# Patient Record
Sex: Male | Born: 1960
Health system: Southern US, Community
[De-identification: ages and names within clinical notes are randomized; demographics above are authoritative.]

## PROBLEM LIST (undated history)

## (undated) DIAGNOSIS — F419 Anxiety disorder, unspecified: Secondary | ICD-10-CM

## (undated) DIAGNOSIS — Z95818 Presence of other cardiac implants and grafts: Secondary | ICD-10-CM

## (undated) DIAGNOSIS — J189 Pneumonia, unspecified organism: Secondary | ICD-10-CM

## (undated) DIAGNOSIS — I219 Acute myocardial infarction, unspecified: Secondary | ICD-10-CM

## (undated) DIAGNOSIS — I5020 Unspecified systolic (congestive) heart failure: Secondary | ICD-10-CM

## (undated) DIAGNOSIS — I739 Peripheral vascular disease, unspecified: Secondary | ICD-10-CM

## (undated) DIAGNOSIS — I509 Heart failure, unspecified: Secondary | ICD-10-CM

## (undated) DIAGNOSIS — I10 Essential (primary) hypertension: Secondary | ICD-10-CM

## (undated) DIAGNOSIS — R7303 Prediabetes: Secondary | ICD-10-CM

## (undated) DIAGNOSIS — E785 Hyperlipidemia, unspecified: Secondary | ICD-10-CM

## (undated) DIAGNOSIS — I2510 Atherosclerotic heart disease of native coronary artery without angina pectoris: Secondary | ICD-10-CM

## (undated) HISTORY — DX: Hyperlipidemia, unspecified: E78.5

## (undated) HISTORY — PX: COLONOSCOPY: SHX174

## (undated) HISTORY — DX: Essential (primary) hypertension: I10

## (undated) HISTORY — PX: CARDIAC CATHETERIZATION: SHX172

## (undated) HISTORY — DX: Atherosclerotic heart disease of native coronary artery without angina pectoris: I25.10

## (undated) HISTORY — DX: Acute myocardial infarction, unspecified: I21.9

---

## 2009-02-19 ENCOUNTER — Emergency Department (HOSPITAL_COMMUNITY): Admission: EM | Admit: 2009-02-19 | Discharge: 2009-02-19 | Payer: Self-pay | Admitting: Emergency Medicine

## 2016-03-16 ENCOUNTER — Ambulatory Visit (INDEPENDENT_AMBULATORY_CARE_PROVIDER_SITE_OTHER): Payer: BLUE CROSS/BLUE SHIELD

## 2016-03-16 ENCOUNTER — Ambulatory Visit (INDEPENDENT_AMBULATORY_CARE_PROVIDER_SITE_OTHER): Payer: BLUE CROSS/BLUE SHIELD | Admitting: Physician Assistant

## 2016-03-16 VITALS — BP 136/78 | HR 95 | Temp 100.7°F | Resp 18 | Ht 71.5 in | Wt 182.0 lb

## 2016-03-16 DIAGNOSIS — R509 Fever, unspecified: Secondary | ICD-10-CM

## 2016-03-16 DIAGNOSIS — R05 Cough: Secondary | ICD-10-CM | POA: Diagnosis not present

## 2016-03-16 DIAGNOSIS — D72828 Other elevated white blood cell count: Secondary | ICD-10-CM

## 2016-03-16 DIAGNOSIS — R059 Cough, unspecified: Secondary | ICD-10-CM

## 2016-03-16 DIAGNOSIS — R5383 Other fatigue: Secondary | ICD-10-CM | POA: Diagnosis not present

## 2016-03-16 LAB — POCT INFLUENZA A/B
Influenza A, POC: NEGATIVE
Influenza B, POC: NEGATIVE

## 2016-03-16 LAB — POCT CBC
Granulocyte percent: 81.7 %G — AB (ref 37–80)
HCT, POC: 44.1 % (ref 43.5–53.7)
Hemoglobin: 15.5 g/dL (ref 14.1–18.1)
Lymph, poc: 1 (ref 0.6–3.4)
MCH, POC: 30 pg (ref 27–31.2)
MCHC: 35.2 g/dL (ref 31.8–35.4)
MCV: 85 fL (ref 80–97)
MID (cbc): 0.4 (ref 0–0.9)
MPV: 7.8 fL (ref 0–99.8)
POC Granulocyte: 6.4 (ref 2–6.9)
POC LYMPH PERCENT: 13.2 %L (ref 10–50)
POC MID %: 5.1 %M (ref 0–12)
Platelet Count, POC: 116 10*3/uL — AB (ref 142–424)
RBC: 5.18 M/uL (ref 4.69–6.13)
RDW, POC: 14.8 %
WBC: 7.8 10*3/uL (ref 4.6–10.2)

## 2016-03-16 MED ORDER — AZITHROMYCIN 250 MG PO TABS: ORAL_TABLET | ORAL | Status: DC

## 2016-03-16 MED ORDER — HYDROCODONE-HOMATROPINE 5-1.5 MG/5ML PO SYRP: 5.0000 mL | ORAL_SOLUTION | Freq: Three times a day (TID) | ORAL | Status: DC | PRN

## 2016-03-16 MED ORDER — NAPROXEN 500 MG PO TABS: 500.0000 mg | ORAL_TABLET | Freq: Two times a day (BID) | ORAL | Status: DC

## 2016-03-16 NOTE — Patient Instructions (Signed)
     IF you received an x-ray today, you will receive an invoice from [facility]. Please contact [facility] at [phone] with questions or concerns regarding your invoice.   IF you received labwork today, you will receive an invoice from [facility]. Please contact [facility] at [phone] with questions or concerns regarding your invoice.   Our billing staff will not be able to assist you with questions regarding bills from these companies.  You will be contacted with the lab results as soon as they are available. The fastest way to get your results is to activate your My Chart account. Instructions are located on the last page of this paperwork. If you have not heard from us regarding the results in 2 weeks, please contact this office.      

## 2016-03-16 NOTE — Progress Notes (Signed)
03/16/2016 5:44 PM   DOB: 01-Jun-1961 / MRN: [id number]  SUBJECTIVE:  Mike [name] is a 55 y.o. male with a 20 pack year history of smoking presenting for cough and fever that started two days ago. Associates rigor near the onset of the illness.  Has been taking OTC medication for cough with some relief.  Feels that he is getting worse.  He has not had the flu shot. He reports a history of walking pneumonia, states these symptoms feel similar to that.    There is no immunization history on file for this patient.  He has No Known Allergies.   He  has no past medical history on file.    He  reports that he has been smoking.  He does not have any smokeless tobacco history on file. He  has no sexual activity history on file. The patient  has no past surgical history on file.  His family history includes Cancer in his brother; Heart disease in his father and mother.  Review of Systems  Constitutional: Positive for fever, chills and malaise/fatigue.  Respiratory: Positive for cough. Negative for sputum production and shortness of breath.   Cardiovascular: Negative for chest pain.  Musculoskeletal: Negative for myalgias.  Skin: Negative for rash.  Neurological: Negative for dizziness and headaches.    Problem list and medications reviewed and updated by myself where necessary, and exist elsewhere in the encounter.   OBJECTIVE:  BP 136/78 mmHg  Pulse 95  Temp(Src) 100.7 F (38.2 C) (Oral)  Resp 18  Ht 5' 11.5" (1.816 m)  Wt 182 lb (82.555 kg)  BMI 25.03 kg/m2  SpO2 93%   SpO2 Readings from Last 3 Encounters:  03/16/16 93%   Physical Exam  Constitutional: He is oriented to person, place, and time. He appears well-developed. He does not appear ill.  Eyes: Conjunctivae and EOM are normal. Pupils are equal, round, and reactive to light.  Cardiovascular: Normal rate, regular rhythm and normal heart sounds.   Pulmonary/Chest: Effort normal and breath sounds normal. No respiratory  distress. He has no wheezes. He has no rales. He exhibits no tenderness.  Abdominal: Soft. He exhibits no distension.  Musculoskeletal: Normal range of motion.  Neurological: He is alert and oriented to person, place, and time. No cranial nerve deficit. Coordination normal.  Skin: Skin is warm and dry. He is not diaphoretic.  Psychiatric: He has a normal mood and affect.  Nursing note and vitals reviewed.   Results for orders placed or performed in visit on 03/16/16 (from the past 72 hour(s))  POCT CBC     Status: Abnormal   Collection Time: 03/16/16  4:42 PM  Result Value Ref Range   WBC 7.8 4.6 - 10.2 K/uL   Lymph, poc 1.0 0.6 - 3.4   POC LYMPH PERCENT 13.2 10 - 50 %L   MID (cbc) 0.4 0 - 0.9   POC MID % 5.1 0 - 12 %M   POC Granulocyte 6.4 2 - 6.9   Granulocyte percent 81.7 (A) 37 - 80 %G   RBC 5.18 4.69 - 6.13 M/uL   Hemoglobin 15.5 14.1 - 18.1 g/dL   HCT, POC 44.1 43.5 - 53.7 %   MCV 85.0 80 - 97 fL   MCH, POC 30.0 27 - 31.2 pg   MCHC 35.2 31.8 - 35.4 g/dL   RDW, POC 14.8 %   Platelet Count, POC 116 (A) 142 - 424 K/uL   MPV 7.8 0 - 99.8 fL  POCT Influenza A/B     Status: Normal   Collection Time: 03/16/16  4:56 PM  Result Value Ref Range   Influenza A, POC Negative Negative   Influenza B, POC Negative Negative   No results found.  ASSESSMENT AND PLAN  Vanny was seen today for fever, chills, cough and anorexia.  Diagnoses and all orders for this visit:  Cough: His HPI is consistent with pneumonia.  He is negative for the flu and his cell counts, though mild, point in the direction of a bacterial etiology. His pulse ox was 93% on arrival, and this increased to 98% upon recheck. His chest appears clear to me on xray.  I will treat him with a macrolide to cover for atypical pneumonia.  Advised that he return in 24-48 hour if he is not improved and he is advised to return sooner if he is worse.  -     Pulse oximetry (single) -     POCT CBC -     DG Chest 2 View; Future -      POCT Influenza A/B -     HYDROcodone-homatropine (HYCODAN) 5-1.5 MG/5ML syrup; Take 5 mLs by mouth every 8 (eight) hours as needed for cough.  Granulocytosis -     azithromycin (ZITHROMAX) 250 MG tablet; Take two daily for three days. -     naproxen (NAPROSYN) 500 MG tablet; Take 1 tablet (500 mg total) by mouth 2 (two) times daily with a meal.     The patient was advised to call or return to clinic if he does not see an improvement in symptoms or to seek the care of the closest emergency department if he worsens with the above plan.   Philis Fendt, MHS, PA-C [facility] 03/16/2016 5:44 PM

## 2018-12-13 ENCOUNTER — Ambulatory Visit (HOSPITAL_COMMUNITY)
Admission: EM | Admit: 2018-12-13 | Discharge: 2018-12-13 | Disposition: A | Discharge status: Home / Self Care | Payer: Self-pay | Attending: Family Medicine | Admitting: Family Medicine

## 2018-12-13 ENCOUNTER — Encounter (HOSPITAL_COMMUNITY): Payer: Self-pay

## 2018-12-13 DIAGNOSIS — M7918 Myalgia, other site: Secondary | ICD-10-CM | POA: Insufficient documentation

## 2018-12-13 DIAGNOSIS — R030 Elevated blood-pressure reading, without diagnosis of hypertension: Secondary | ICD-10-CM | POA: Insufficient documentation

## 2018-12-13 MED ORDER — IBUPROFEN 800 MG PO TABS: 800.0000 mg | ORAL_TABLET | Freq: Three times a day (TID) | ORAL | 0 refills | Status: DC

## 2018-12-13 MED ORDER — TIZANIDINE HCL 4 MG PO TABS: 4.0000 mg | ORAL_TABLET | Freq: Four times a day (QID) | ORAL | 0 refills | Status: DC | PRN

## 2018-12-13 NOTE — ED Notes (Signed)
DR. Meda Coffee aware of last b/p

## 2018-12-13 NOTE — ED Triage Notes (Signed)
Pt present neck and shoulder pain that has been going on over a week.  Pt believes he has a pinch nerve.

## 2018-12-13 NOTE — ED Provider Notes (Signed)
[facility]    CSN: [id number] Arrival date & time: 12/13/18  1512     History   Chief Complaint Chief Complaint  Patient presents with  . Neck Pain  . Shoulder Pain    HPI Mike [name] is a 57 y.o. male.   HPI   Pain in left neck for a week and a half.  No trauma.  No injury.  No overuse.  No vehicle accident or fall.  He states that he has been trying some Tylenol and ibuprofen.  Nothing is helping.  He has some pain that radiates from the left neck down into the left forearm. No weakness.  No numbness of hand.  No known arthritis of neck.    History reviewed. No pertinent past medical history.  There are no active problems to display for this patient.   History reviewed. No pertinent surgical history.     Home Medications    Prior to Admission medications   Medication Sig Start Date End Date Taking? Authorizing Provider  ibuprofen (ADVIL,MOTRIN) 800 MG tablet Take 1 tablet (800 mg total) by mouth 3 (three) times daily. 12/13/18   Raylene Everts, MD  tiZANidine (ZANAFLEX) 4 MG tablet Take 1-2 tablets (4-8 mg total) by mouth every 6 (six) hours as needed for muscle spasms. 12/13/18   Raylene Everts, MD    Family History Family History  Problem Relation Age of Onset  . Heart disease Mother   . Heart disease Father   . Cancer Brother     Social History Social History   Tobacco Use  . Smoking status: Current Every Day Smoker  . Smokeless tobacco: Current User  Substance Use Topics  . Alcohol use: Not on file  . Drug use: Not on file     Allergies   Patient has no known allergies.   Review of Systems Review of Systems  Constitutional: Negative for chills and fever.  HENT: Negative for ear pain and sore throat.   Eyes: Negative for pain and visual disturbance.  Respiratory: Negative for cough and shortness of breath.   Cardiovascular: Negative for chest pain and palpitations.  Gastrointestinal: Negative for abdominal pain  and vomiting.  Genitourinary: Negative for dysuria and hematuria.  Musculoskeletal: Positive for neck pain and neck stiffness. Negative for arthralgias and back pain.  Skin: Negative for color change and rash.  Neurological: Negative for seizures, syncope, weakness and numbness.       Some pain into the forearm  All other systems reviewed and are negative.    Physical Exam Triage Vital Signs ED Triage Vitals  Enc Vitals Group     BP 12/13/18 1610 (!) 205/109     Pulse Rate 12/13/18 1610 90     Resp 12/13/18 1610 18     Temp 12/13/18 1610 98.5 F (36.9 C)     Temp Source 12/13/18 1610 Oral     SpO2 12/13/18 1610 98 %     Weight --      Height --      Head Circumference --      Peak Flow --      Pain Score 12/13/18 1607 9     Pain Loc --      Pain Edu? --      Excl. in [place]? --    No data found.  Updated Vital Signs BP (!) 212/108 (BP Location: Right Arm)   Pulse 79   Temp 98.5 F (36.9 C) (Oral)  Resp 18   SpO2 94%     Physical Exam Constitutional:      General: He is in acute distress.     Appearance: He is well-developed.  HENT:     Head: Normocephalic and atraumatic.     Right Ear: Tympanic membrane, ear canal and external ear normal.     Left Ear: Tympanic membrane, ear canal and external ear normal.     Mouth/Throat:     Mouth: Mucous membranes are moist.     Pharynx: Oropharynx is clear.  Eyes:     Conjunctiva/sclera: Conjunctivae normal.     Pupils: Pupils are equal, round, and reactive to light.  Neck:     Musculoskeletal: Normal range of motion. Muscular tenderness present.   Cardiovascular:     Rate and Rhythm: Normal rate.  Pulmonary:     Effort: Pulmonary effort is normal. No respiratory distress.  Abdominal:     General: There is no distension.     Palpations: Abdomen is soft.  Musculoskeletal: Normal range of motion.  Lymphadenopathy:     Cervical: No cervical adenopathy.  Skin:    General: Skin is warm and dry.  Neurological:      General: No focal deficit present.     Mental Status: He is alert. Mental status is at baseline.     Sensory: No sensory deficit.     Coordination: Coordination normal.     Gait: Gait normal.     Deep Tendon Reflexes: Reflexes normal.  Psychiatric:        Mood and Affect: Mood normal.        Thought Content: Thought content normal.      UC Treatments / Results  Labs (all labs ordered are listed, but only abnormal results are displayed) Labs Reviewed - No data to display  EKG None  Radiology No results found.  Procedures Procedures (including critical care time)  Medications Ordered in UC Medications - No data to display  Initial Impression / Assessment and Plan / UC Course  I have reviewed the triage vital signs and the nursing notes.  Pertinent labs & imaging results that were available during my care of the patient were reviewed by me and considered in my medical decision making (see chart for details).     Discussed hypertension, need for followup Discussed treatment of myofascial pain.  Offered a trigger injection - declines Final Clinical Impressions(s) / UC Diagnoses   Final diagnoses:  Myofascial muscle pain  Elevated blood-pressure reading, without diagnosis of hypertension     Discharge Instructions     Ice or heat to area Gentle massage recommended Take the ibuprofen 3 x a day with food Take the zanaflex as needed muscle relaxer This is useful at night Follow up on your elevated blood pressure when pain is improved    ED Prescriptions    Medication Sig Dispense Auth. Provider   ibuprofen (ADVIL,MOTRIN) 800 MG tablet Take 1 tablet (800 mg total) by mouth 3 (three) times daily. 21 tablet Raylene Everts, MD   tiZANidine (ZANAFLEX) 4 MG tablet Take 1-2 tablets (4-8 mg total) by mouth every 6 (six) hours as needed for muscle spasms. 21 tablet Raylene Everts, MD     Controlled Substance Prescriptions [place] Controlled Substance Registry  consulted? Not Applicable   Raylene Everts, MD 12/13/18 [phone]

## 2018-12-13 NOTE — Discharge Instructions (Signed)
Ice or heat to area Gentle massage recommended Take the ibuprofen 3 x a day with food Take the zanaflex as needed muscle relaxer This is useful at night Follow up on your elevated blood pressure when pain is improved

## 2020-08-01 ENCOUNTER — Inpatient Hospital Stay (HOSPITAL_COMMUNITY)
Admission: EM | Admit: 2020-08-01 | Discharge: 2020-08-04 | DRG: 246 | Disposition: A | Discharge status: Home / Self Care | Payer: BC Managed Care – PPO | Attending: Cardiology | Admitting: Cardiology

## 2020-08-01 ENCOUNTER — Encounter (HOSPITAL_COMMUNITY)
Admission: EM | Disposition: A | Discharge status: Home / Self Care | Payer: Self-pay | Source: Home / Self Care | Attending: Cardiology

## 2020-08-01 ENCOUNTER — Encounter (HOSPITAL_COMMUNITY): Payer: Self-pay | Admitting: Emergency Medicine

## 2020-08-01 ENCOUNTER — Other Ambulatory Visit: Payer: Self-pay

## 2020-08-01 DIAGNOSIS — G4733 Obstructive sleep apnea (adult) (pediatric): Secondary | ICD-10-CM | POA: Diagnosis not present

## 2020-08-01 DIAGNOSIS — E876 Hypokalemia: Secondary | ICD-10-CM | POA: Diagnosis not present

## 2020-08-01 DIAGNOSIS — I2510 Atherosclerotic heart disease of native coronary artery without angina pectoris: Secondary | ICD-10-CM | POA: Diagnosis present

## 2020-08-01 DIAGNOSIS — E785 Hyperlipidemia, unspecified: Secondary | ICD-10-CM | POA: Diagnosis present

## 2020-08-01 DIAGNOSIS — I2119 ST elevation (STEMI) myocardial infarction involving other coronary artery of inferior wall: Secondary | ICD-10-CM | POA: Diagnosis not present

## 2020-08-01 DIAGNOSIS — I10 Essential (primary) hypertension: Secondary | ICD-10-CM

## 2020-08-01 DIAGNOSIS — S60211A Contusion of right wrist, initial encounter: Secondary | ICD-10-CM | POA: Diagnosis not present

## 2020-08-01 DIAGNOSIS — I083 Combined rheumatic disorders of mitral, aortic and tricuspid valves: Secondary | ICD-10-CM | POA: Diagnosis not present

## 2020-08-01 DIAGNOSIS — Z8249 Family history of ischemic heart disease and other diseases of the circulatory system: Secondary | ICD-10-CM | POA: Diagnosis not present

## 2020-08-01 DIAGNOSIS — I110 Hypertensive heart disease with heart failure: Secondary | ICD-10-CM | POA: Diagnosis not present

## 2020-08-01 DIAGNOSIS — Z20822 Contact with and (suspected) exposure to covid-19: Secondary | ICD-10-CM | POA: Diagnosis present

## 2020-08-01 DIAGNOSIS — F17210 Nicotine dependence, cigarettes, uncomplicated: Secondary | ICD-10-CM | POA: Diagnosis present

## 2020-08-01 DIAGNOSIS — Z955 Presence of coronary angioplasty implant and graft: Secondary | ICD-10-CM

## 2020-08-01 DIAGNOSIS — X58XXXA Exposure to other specified factors, initial encounter: Secondary | ICD-10-CM | POA: Diagnosis not present

## 2020-08-01 DIAGNOSIS — I4901 Ventricular fibrillation: Secondary | ICD-10-CM | POA: Diagnosis not present

## 2020-08-01 DIAGNOSIS — R0602 Shortness of breath: Secondary | ICD-10-CM

## 2020-08-01 DIAGNOSIS — I340 Nonrheumatic mitral (valve) insufficiency: Secondary | ICD-10-CM | POA: Diagnosis not present

## 2020-08-01 DIAGNOSIS — I213 ST elevation (STEMI) myocardial infarction of unspecified site: Secondary | ICD-10-CM | POA: Diagnosis present

## 2020-08-01 DIAGNOSIS — I200 Unstable angina: Secondary | ICD-10-CM | POA: Diagnosis not present

## 2020-08-01 DIAGNOSIS — I517 Cardiomegaly: Secondary | ICD-10-CM | POA: Diagnosis not present

## 2020-08-01 DIAGNOSIS — R52 Pain, unspecified: Secondary | ICD-10-CM | POA: Diagnosis not present

## 2020-08-01 DIAGNOSIS — I219 Acute myocardial infarction, unspecified: Secondary | ICD-10-CM

## 2020-08-01 DIAGNOSIS — I2111 ST elevation (STEMI) myocardial infarction involving right coronary artery: Secondary | ICD-10-CM | POA: Diagnosis not present

## 2020-08-01 DIAGNOSIS — I5021 Acute systolic (congestive) heart failure: Secondary | ICD-10-CM | POA: Diagnosis present

## 2020-08-01 DIAGNOSIS — I469 Cardiac arrest, cause unspecified: Secondary | ICD-10-CM | POA: Diagnosis not present

## 2020-08-01 DIAGNOSIS — R0789 Other chest pain: Secondary | ICD-10-CM | POA: Diagnosis not present

## 2020-08-01 DIAGNOSIS — R079 Chest pain, unspecified: Secondary | ICD-10-CM | POA: Diagnosis not present

## 2020-08-01 HISTORY — PX: LEFT HEART CATH AND CORONARY ANGIOGRAPHY: CATH118249

## 2020-08-01 HISTORY — PX: CORONARY/GRAFT ACUTE MI REVASCULARIZATION: CATH118305

## 2020-08-01 HISTORY — DX: Acute myocardial infarction, unspecified: I21.9

## 2020-08-01 SURGERY — CORONARY/GRAFT ACUTE MI REVASCULARIZATION
Anesthesia: LOCAL

## 2020-08-01 MED ORDER — LIDOCAINE HCL (PF) 1 % IJ SOLN
INTRAMUSCULAR | Status: DC | PRN
  Administered 2020-08-01: 5 mL via SUBCUTANEOUS

## 2020-08-01 MED ORDER — HEPARIN (PORCINE) IN NACL 1000-0.9 UT/500ML-% IV SOLN
INTRAVENOUS | Status: AC
  Filled 2020-08-01: qty 500

## 2020-08-01 MED ORDER — HEPARIN SODIUM (PORCINE) 1000 UNIT/ML IJ SOLN
INTRAMUSCULAR | Status: DC | PRN
  Administered 2020-08-01: 6000 [IU] via INTRAVENOUS

## 2020-08-01 MED ORDER — VERAPAMIL HCL 2.5 MG/ML IV SOLN
INTRAVENOUS | Status: AC
  Filled 2020-08-01: qty 2

## 2020-08-01 MED ORDER — TICAGRELOR 90 MG PO TABS
ORAL_TABLET | ORAL | Status: AC
  Filled 2020-08-01: qty 1

## 2020-08-01 MED ORDER — LABETALOL HCL 5 MG/ML IV SOLN
10.0000 mg | Freq: Once | INTRAVENOUS | Status: AC
  Administered 2020-08-01: 10 mg via INTRAVENOUS

## 2020-08-01 MED ORDER — SODIUM CHLORIDE 0.9 % IV SOLN
INTRAVENOUS | Status: AC | PRN
  Administered 2020-08-01: 91 mL/h via INTRAVENOUS

## 2020-08-01 MED ORDER — MORPHINE SULFATE (PF) 4 MG/ML IV SOLN
INTRAVENOUS | Status: AC
  Filled 2020-08-01: qty 1

## 2020-08-01 MED ORDER — LIDOCAINE HCL (PF) 1 % IJ SOLN
INTRAMUSCULAR | Status: AC
  Filled 2020-08-01: qty 30

## 2020-08-01 MED ORDER — IOHEXOL 350 MG/ML SOLN
INTRAVENOUS | Status: AC
  Filled 2020-08-01: qty 1

## 2020-08-01 MED ORDER — TICAGRELOR 90 MG PO TABS
ORAL_TABLET | ORAL | Status: DC | PRN
  Administered 2020-08-01: 180 mg via ORAL

## 2020-08-01 MED ORDER — HEPARIN SODIUM (PORCINE) 1000 UNIT/ML IJ SOLN
INTRAMUSCULAR | Status: AC
  Filled 2020-08-01: qty 1

## 2020-08-01 MED ORDER — HEPARIN SODIUM (PORCINE) 5000 UNIT/ML IJ SOLN
4000.0000 [IU] | Freq: Once | INTRAMUSCULAR | Status: AC
  Administered 2020-08-01: 4000 [IU] via INTRAVENOUS

## 2020-08-01 MED ORDER — VERAPAMIL HCL 2.5 MG/ML IV SOLN
INTRAVENOUS | Status: DC | PRN
  Administered 2020-08-01: 10 mL via INTRA_ARTERIAL

## 2020-08-01 MED ORDER — HEPARIN (PORCINE) IN NACL 1000-0.9 UT/500ML-% IV SOLN
INTRAVENOUS | Status: DC | PRN
  Administered 2020-08-01 (×2): 500 mL

## 2020-08-01 SURGICAL SUPPLY — 19 items
BALLN SAPPHIRE 2.5X15 (BALLOONS) ×2
BALLN SAPPHIRE ~~LOC~~ 2.75X12 (BALLOONS) ×2 IMPLANT
BALLOON SAPPHIRE 2.5X15 (BALLOONS) ×1 IMPLANT
CATH INFINITI 5 FR JL3.5 (CATHETERS) ×2 IMPLANT
CATH LAUNCHER 6FR JR4 (CATHETERS) ×2 IMPLANT
CATH OPTITORQUE TIG 4.0 5F (CATHETERS) ×2 IMPLANT
DEVICE RAD COMP TR BAND LRG (VASCULAR PRODUCTS) ×2 IMPLANT
GLIDESHEATH SLEND A-KIT 6F 22G (SHEATH) ×2 IMPLANT
GUIDEWIRE INQWIRE 1.5J.035X260 (WIRE) ×1 IMPLANT
INQWIRE 1.5J .035X260CM (WIRE) ×2
KIT ENCORE 26 ADVANTAGE (KITS) ×2 IMPLANT
KIT HEART LEFT (KITS) ×2 IMPLANT
KIT HEMO VALVE WATCHDOG (MISCELLANEOUS) ×2 IMPLANT
PACK CARDIAC CATHETERIZATION (CUSTOM PROCEDURE TRAY) ×2 IMPLANT
STENT RESOLUTE ONYX 2.5X26 (Permanent Stent) ×2 IMPLANT
SYR MEDRAD MARK 7 150ML (SYRINGE) ×2 IMPLANT
TRANSDUCER W/STOPCOCK (MISCELLANEOUS) ×2 IMPLANT
TUBING CIL FLEX 10 FLL-RA (TUBING) ×2 IMPLANT
WIRE ASAHI PROWATER 180CM (WIRE) ×2 IMPLANT

## 2020-08-01 NOTE — ED Triage Notes (Signed)
Pt brought to ED by [facility] from home as a code STEMI, cp with sob while watching tv at home. BP  220/133, HR 114, R 24, SPO2 94% RA. 324 mg ASA, 4 mg MS, 4 mg Zofran given by EMS pta to ED.

## 2020-08-01 NOTE — Interval H&P Note (Signed)
History and Physical Interval Note:  08/01/2020 11:21 PM  Mike [name]  has presented today for surgery, with the diagnosis of STEMI.  The various methods of treatment have been discussed with the patient and family. After consideration of risks, benefits and other options for treatment, the patient has consented to  Procedure(s): Coronary/Graft Acute MI Revascularization (N/A) LEFT HEART CATH AND CORONARY ANGIOGRAPHY (N/A) as a surgical intervention.  The patient's history has been reviewed, patient examined, no change in status, stable for surgery.  I have reviewed the patient's chart and labs.  Questions were answered to the patient's satisfaction.    2016 Appropriate Use Criteria for Coronary Revascularization in Patients With Acute Coronary Syndrome STEMI Revascularization Strategy Time since STEMI symptom onset Link Here: [email or web address] Patient Information: Revascularization Strategy Immediate Revascularization by PCI  Time since STEMI symptom onset <=12 hours  Indication:  Revascularization of the Presumed STEMI Culprit Artery by Primary PCI <=12 hours after symptom onset A (9) Indication: 1; Score 9 10319 Indication:  Immediate Revascularization of 1 or more Non-culprit Arteries during the Same Procedure Following Successful Treatment of the STEMI Culprit Artery by Primary PCI.  PCI or CABG of 1 or more additional vessels for Cardiogenic Shock Persisting after PCI of the presumed culprit artery A (8) Indication: 4; Score 8 10320 Indication:  Immediate Revascularization of 1 or more Non-culprit Arteries during the Same Procedure Following Successful Treatment of the STEMI Culprit Artery by Primary PCI.  Revascularization of 1 or more additional severe (>=70% DS) stenoses in a stable patient immediately following PCI of the presumed culprit artery M (6) Indication: 5; Score 6 10321 Indication:  Immediate  Revascularization of 1 or more Non-culprit Arteries during the Same Procedure Following Successful Treatment of the STEMI Culprit Artery by Primary PCI.  Revascularization of 1 or more additional intermediate (50-69% DS) stenoses in a stable patient immediately following PCI of the presumed culprit artery M (4) Indication: 6; Score 4   [name] [name]

## 2020-08-01 NOTE — ED Notes (Signed)
Cardiologist at the bedside

## 2020-08-01 NOTE — ED Provider Notes (Addendum)
[place] EMERGENCY DEPARTMENT Provider Note   CSN: [id number] Arrival date & time: 08/01/20  2253     History Chief Complaint  Patient presents with  . Chest Pain  . Code STEMI    Mike [name] is a 59 y.o. male.  59 yo M with a cc of chest pain and diaphoresis.  Started about 30 min to an hour ago.  SOB.  Feels like a pressure to his chest.  No radiation.  EMS on scene with STEMI.  Given 324 aspirin and 6mg  of morphine.  Smoker, HTN.  Denies radiation to the back.  Denies abdominal pain.  Mild cough today.  Denies fever.  Denies chest pain for the past couple days.  Started while watching TV.   The history is provided by the patient.  Chest Pain Pain location:  Substernal area Pain quality: crushing   Pain radiates to:  Does not radiate Pain severity:  Severe Onset quality:  Sudden Duration:  1 hour Timing:  Constant Progression:  Partially resolved Chronicity:  New Relieved by:  Nothing Worsened by:  Nothing Ineffective treatments:  None tried Associated symptoms: diaphoresis and shortness of breath   Associated symptoms: no abdominal pain, no fever, no headache, no palpitations and no vomiting   Risk factors: hypertension and smoking        History reviewed. No pertinent past medical history.  Patient Active Problem List   Diagnosis Date Noted  . Accelerated hypertension 08/01/2020    History reviewed. No pertinent surgical history.     Family History  Problem Relation Age of Onset  . Heart disease Mother   . Heart disease Father   . Cancer Brother     Social History   Tobacco Use  . Smoking status: Current Every Day Smoker    Packs/day: 0.50    Years: 40.00    Pack years: 20.00    Types: Cigarettes  . Smokeless tobacco: Current User  Substance Use Topics  . Alcohol use: Not on file  . Drug use: Not on file    Home Medications Prior to Admission medications   Medication Sig Start Date End Date Taking? Authorizing  Provider  ibuprofen (ADVIL,MOTRIN) 800 MG tablet Take 1 tablet (800 mg total) by mouth 3 (three) times daily. 12/13/18   Raylene Everts, MD  tiZANidine (ZANAFLEX) 4 MG tablet Take 1-2 tablets (4-8 mg total) by mouth every 6 (six) hours as needed for muscle spasms. 12/13/18   Raylene Everts, MD    Allergies    Patient has no known allergies.  Review of Systems   Review of Systems  Constitutional: Positive for diaphoresis. Negative for chills and fever.  HENT: Negative for congestion and facial swelling.   Eyes: Negative for discharge and visual disturbance.  Respiratory: Positive for shortness of breath.   Cardiovascular: Positive for chest pain. Negative for palpitations.  Gastrointestinal: Negative for abdominal pain, diarrhea and vomiting.  Musculoskeletal: Negative for arthralgias and myalgias.  Skin: Negative for color change and rash.  Neurological: Negative for tremors, syncope and headaches.  Psychiatric/Behavioral: Negative for confusion and dysphoric mood.    Physical Exam Updated Vital Signs BP (!) 203/93   Pulse (!) 112   Temp 98.3 F (36.8 C) (Oral)   Resp (!) 21   Ht 6' (1.829 m)   Wt 90.7 kg   SpO2 95%   BMI 27.12 kg/m   Physical Exam Vitals and nursing note reviewed.  Constitutional:  Appearance: He is well-developed. He is diaphoretic.  HENT:     Head: Normocephalic and atraumatic.  Eyes:     Pupils: Pupils are equal, round, and reactive to light.  Neck:     Vascular: No JVD.  Cardiovascular:     Rate and Rhythm: Regular rhythm. Tachycardia present.     Heart sounds: No murmur heard.  No friction rub. No gallop.   Pulmonary:     Effort: No respiratory distress.     Breath sounds: No decreased breath sounds, wheezing or rhonchi.  Abdominal:     General: There is no distension.     Tenderness: There is no guarding or rebound.  Musculoskeletal:        General: Normal range of motion.     Cervical back: Normal range of motion and neck  supple.  Skin:    Coloration: Skin is not pale.     Findings: No rash.  Neurological:     Mental Status: He is alert and oriented to person, place, and time.  Psychiatric:        Behavior: Behavior normal.     ED Results / Procedures / Treatments   Labs (all labs ordered are listed, but only abnormal results are displayed) Labs Reviewed - No data to display  EKG EKG Interpretation  Date/Time:  Wednesday August 01 2020 22:55:47 EDT Ventricular Rate:  116 PR Interval:    QRS Duration: 122 QT Interval:  305 QTC Calculation: 424 R Axis:   91 Text Interpretation: Sinus tachycardia Nonspecific intraventricular conduction delay Inferior infarct, acute (RCA) Anteroseptal infarct, age indeterminate Probable RV involvement, suggest recording right precordial leads >>> Acute MI <<< No old tracing to compare STEMI called Confirmed by Deno Etienne [phone]) on 08/01/2020 11:18:52 PM   Radiology No results found.  Procedures Procedures (including critical care time) Discussed smoking cessation with patient and was they were offerred resources to help stop.  Total time was 5 min CPT code 99406.   Medications Ordered in ED Medications  morphine 4 MG/ML injection (has no administration in time range)  labetalol (NORMODYNE) injection 10 mg (10 mg Intravenous Given 08/01/20 2303)  heparin injection 4,000 Units (4,000 Units Intravenous Given 08/01/20 2302)    ED Course  I have reviewed the triage vital signs and the nursing notes.  Pertinent labs & imaging results that were available during my care of the patient were reviewed by me and considered in my medical decision making (see chart for details).    MDM Rules/Calculators/A&P                          60 yo M with a cc of chest pain.  Found to have an inferior STEMI on arrival with EMS.  Given heparin here, started on O2 as dipping into mid 80's.    Cards at bedside, given labetalol, morphine.  Will take emergently to the cath lab.    CRITICAL CARE Performed by: Cecilio Asper   Total critical care time: 35 minutes  Critical care time was exclusive of separately billable procedures and treating other patients.  Critical care was necessary to treat or prevent imminent or life-threatening deterioration.  Critical care was time spent personally by me on the following activities: development of treatment plan with patient and/or surrogate as well as nursing, discussions with consultants, evaluation of patient's response to treatment, examination of patient, obtaining history from patient or surrogate, ordering and performing treatments and interventions, ordering  and review of laboratory studies, ordering and review of radiographic studies, pulse oximetry and re-evaluation of patient's condition.  The patients results and plan were reviewed and discussed.   Any x-rays performed were independently reviewed by myself.   Differential diagnosis were considered with the presenting HPI.  Medications  morphine 4 MG/ML injection (has no administration in time range)  labetalol (NORMODYNE) injection 10 mg (10 mg Intravenous Given 08/01/20 2303)  heparin injection 4,000 Units (4,000 Units Intravenous Given 08/01/20 2302)    Vitals:   08/01/20 2257 08/01/20 2259 08/01/20 2303 08/01/20 2304  BP:  (!) 213/114 (!) 203/93   Pulse:  (!) 112 (!) 113 (!) 112  Resp:  18 (!) 26 (!) 21  Temp:  98.3 F (36.8 C)    TempSrc:  Oral    SpO2:  94% 94% 95%  Weight: 90.7 kg     Height: 6' (1.829 m)       Final diagnoses:  Acute ST elevation myocardial infarction (STEMI) of inferior wall (HCC)    Admission/ observation were discussed with the admitting physician, patient and/or family and they are comfortable with the plan.    Final Clinical Impression(s) / ED Diagnoses Final diagnoses:  Acute ST elevation myocardial infarction (STEMI) of inferior wall [facility])    Rx / DC Orders ED Discharge Orders    None            Deno Etienne, DO 08/01/20 2319

## 2020-08-01 NOTE — H&P (Signed)
Mike [name] is an 59 y.o. male.   Chief Complaint: Chest pain HPI:   59 y.o. Caucasian male  with no significant medical history with chest pain.  Pain started around 9 PM. No other associated symptoms. BP 213/100 mmHg, HR 110 bpm on presentation. EKG showed inferior ST elevation and anterolateral ST depression.   He received 325 mg ASA, 4000 U heparin, 6 mg morphine, 4 mg zofran by EMS. Received 10 IV labetalol and 4 IV morphine in the ED so far.   He is fully vaccinated against COVID. No recent COVID contacts or symptoms.   History reviewed. No pertinent past medical history.  History reviewed. No pertinent surgical history.  Family History  Problem Relation Age of Onset  . Heart disease Mother   . Heart disease Father   . Cancer Brother    Social History:  reports that he has been smoking cigarettes. He has a 20.00 pack-year smoking history. He uses smokeless tobacco. No history on file for alcohol use and drug use.  Allergies: No Known Allergies  Review of Systems  Constitutional: Negative for decreased appetite, malaise/fatigue, weight gain and weight loss.  HENT: Negative for congestion.   Eyes: Negative for visual disturbance.  Cardiovascular: Positive for chest pain. Negative for dyspnea on exertion, leg swelling, palpitations and syncope.  Respiratory: Negative for cough.   Endocrine: Negative for cold intolerance.  Hematologic/Lymphatic: Does not bruise/bleed easily.  Skin: Negative for itching and rash.  Musculoskeletal: Negative for myalgias.  Gastrointestinal: Negative for abdominal pain, nausea and vomiting.  Genitourinary: Negative for dysuria.  Neurological: Negative for dizziness and weakness.  Psychiatric/Behavioral: The patient is not nervous/anxious.   All other systems reviewed and are negative.    Blood pressure (!) 213/114, pulse (!) 112, temperature 98.3 F (36.8 C), temperature source Oral, resp. rate (!) 21, height 6' (1.829 m), weight 90.7  kg, SpO2 95 %. Body mass index is 27.12 kg/m.  Physical Exam Vitals and nursing note reviewed.  Constitutional:      General: He is not in acute distress.    Appearance: He is well-developed.  HENT:     Head: Normocephalic and atraumatic.  Eyes:     Conjunctiva/sclera: Conjunctivae normal.     Pupils: Pupils are equal, round, and reactive to light.  Neck:     Vascular: No JVD.  Cardiovascular:     Rate and Rhythm: Normal rate and regular rhythm.     Pulses: Intact distal pulses.          Dorsalis pedis pulses are 1+ on the right side and 1+ on the left side.       Posterior tibial pulses are 0 on the right side and 0 on the left side.     Heart sounds: No murmur heard.   Pulmonary:     Effort: Pulmonary effort is normal.     Breath sounds: Normal breath sounds. No wheezing or rales.  Abdominal:     General: Bowel sounds are normal.     Palpations: Abdomen is soft.     Tenderness: There is no rebound.  Musculoskeletal:        General: No tenderness. Normal range of motion.     Left lower leg: No edema.  Lymphadenopathy:     Cervical: No cervical adenopathy.  Skin:    General: Skin is warm and dry.  Neurological:     Mental Status: He is alert and oriented to person, place, and time.     Cranial  Nerves: No cranial nerve deficit.     No results found for this or any previous visit (from the past 48 hour(s)).  Labs:   Lab Results  Component Value Date   WBC 7.8 03/16/2016   HGB 15.5 03/16/2016   HCT 44.1 03/16/2016   MCV 85.0 03/16/2016      Current Facility-Administered Medications:  .  morphine 4 MG/ML injection, , , ,   Current Outpatient Medications:  .  ibuprofen (ADVIL,MOTRIN) 800 MG tablet, Take 1 tablet (800 mg total) by mouth 3 (three) times daily., Disp: 21 tablet, Rfl: 0 .  tiZANidine (ZANAFLEX) 4 MG tablet, Take 1-2 tablets (4-8 mg total) by mouth every 6 (six) hours as needed for muscle spasms., Disp: 21 tablet, Rfl: 0   Today's Vitals    08/01/20 2256 08/01/20 2257 08/01/20 2259 08/01/20 2304  BP:   (!) 213/114   Pulse:   (!) 112 (!) 112  Resp:   18 (!) 21  Temp:   98.3 F (36.8 C)   TempSrc:   Oral   SpO2:   94% 95%  Weight:  90.7 kg    Height:  6' (1.829 m)    PainSc: 6       Body mass index is 27.12 kg/m.   CARDIAC STUDIES:  EKG 08/01/2020: Sinus tachycardia. Acute inferior infarct  Echocardiogram pending:   Assessment/Plan  59 y.o. Caucasian male, smoker with inferior STEMI. Accelerated hypertension.  STEMI: Acute inferior STEMI. Awaiting cath lab transport.  Accelerated hypertension: Could be driven by pain. PRN meds for now. 10 iv labetalol + 10 iv hydralazine in the ED.  Could need cleviprex drip if BP not controlled.     Nigel Mormon, MD 08/01/2020, 11:08 PM [facility]. [place] Pager: [phone] Office: [phone] If no answer: [phone]

## 2020-08-02 ENCOUNTER — Encounter (HOSPITAL_COMMUNITY): Payer: Self-pay | Admitting: Cardiology

## 2020-08-02 ENCOUNTER — Inpatient Hospital Stay (HOSPITAL_COMMUNITY): Payer: BC Managed Care – PPO

## 2020-08-02 DIAGNOSIS — I2510 Atherosclerotic heart disease of native coronary artery without angina pectoris: Secondary | ICD-10-CM | POA: Diagnosis present

## 2020-08-02 DIAGNOSIS — X58XXXA Exposure to other specified factors, initial encounter: Secondary | ICD-10-CM | POA: Diagnosis not present

## 2020-08-02 DIAGNOSIS — I4901 Ventricular fibrillation: Secondary | ICD-10-CM | POA: Diagnosis not present

## 2020-08-02 DIAGNOSIS — I083 Combined rheumatic disorders of mitral, aortic and tricuspid valves: Secondary | ICD-10-CM | POA: Diagnosis present

## 2020-08-02 DIAGNOSIS — R0789 Other chest pain: Secondary | ICD-10-CM | POA: Diagnosis present

## 2020-08-02 DIAGNOSIS — E785 Hyperlipidemia, unspecified: Secondary | ICD-10-CM | POA: Diagnosis present

## 2020-08-02 DIAGNOSIS — Z8249 Family history of ischemic heart disease and other diseases of the circulatory system: Secondary | ICD-10-CM | POA: Diagnosis not present

## 2020-08-02 DIAGNOSIS — G4733 Obstructive sleep apnea (adult) (pediatric): Secondary | ICD-10-CM | POA: Diagnosis present

## 2020-08-02 DIAGNOSIS — I213 ST elevation (STEMI) myocardial infarction of unspecified site: Secondary | ICD-10-CM | POA: Diagnosis present

## 2020-08-02 DIAGNOSIS — F17210 Nicotine dependence, cigarettes, uncomplicated: Secondary | ICD-10-CM | POA: Diagnosis present

## 2020-08-02 DIAGNOSIS — E876 Hypokalemia: Secondary | ICD-10-CM | POA: Diagnosis present

## 2020-08-02 DIAGNOSIS — Z20822 Contact with and (suspected) exposure to covid-19: Secondary | ICD-10-CM | POA: Diagnosis present

## 2020-08-02 DIAGNOSIS — I110 Hypertensive heart disease with heart failure: Secondary | ICD-10-CM | POA: Diagnosis present

## 2020-08-02 DIAGNOSIS — I2119 ST elevation (STEMI) myocardial infarction involving other coronary artery of inferior wall: Secondary | ICD-10-CM | POA: Diagnosis present

## 2020-08-02 DIAGNOSIS — I5021 Acute systolic (congestive) heart failure: Secondary | ICD-10-CM | POA: Diagnosis present

## 2020-08-02 DIAGNOSIS — S60211A Contusion of right wrist, initial encounter: Secondary | ICD-10-CM | POA: Diagnosis not present

## 2020-08-02 DIAGNOSIS — I469 Cardiac arrest, cause unspecified: Secondary | ICD-10-CM | POA: Diagnosis not present

## 2020-08-02 LAB — POCT I-STAT, CHEM 8
BUN: 20 mg/dL (ref 6–20)
BUN: 20 mg/dL (ref 6–20)
Calcium, Ion: 1.05 mmol/L — ABNORMAL LOW (ref 1.15–1.40)
Calcium, Ion: 1.07 mmol/L — ABNORMAL LOW (ref 1.15–1.40)
Chloride: 78 mmol/L — ABNORMAL LOW (ref 98–111)
Chloride: 84 mmol/L — ABNORMAL LOW (ref 98–111)
Creatinine, Ser: 0.4 mg/dL — ABNORMAL LOW (ref 0.61–1.24)
Creatinine, Ser: 0.5 mg/dL — ABNORMAL LOW (ref 0.61–1.24)
Glucose, Bld: 157 mg/dL — ABNORMAL HIGH (ref 70–99)
Glucose, Bld: 168 mg/dL — ABNORMAL HIGH (ref 70–99)
HCT: 42 % (ref 39.0–52.0)
HCT: 44 % (ref 39.0–52.0)
Hemoglobin: 14.3 g/dL (ref 13.0–17.0)
Hemoglobin: 15 g/dL (ref 13.0–17.0)
Potassium: 2.8 mmol/L — ABNORMAL LOW (ref 3.5–5.1)
Potassium: 3 mmol/L — ABNORMAL LOW (ref 3.5–5.1)
Sodium: 113 mmol/L — CL (ref 135–145)
Sodium: 119 mmol/L — CL (ref 135–145)
TCO2: 15 mmol/L — ABNORMAL LOW (ref 22–32)
TCO2: 17 mmol/L — ABNORMAL LOW (ref 22–32)

## 2020-08-02 LAB — BASIC METABOLIC PANEL
Anion gap: 12 (ref 5–15)
BUN: 16 mg/dL (ref 6–20)
CO2: 17 mmol/L — ABNORMAL LOW (ref 22–32)
Calcium: 8.6 mg/dL — ABNORMAL LOW (ref 8.9–10.3)
Chloride: 108 mmol/L (ref 98–111)
Creatinine, Ser: 1 mg/dL (ref 0.61–1.24)
GFR calc Af Amer: >60 mL/min (ref >60)
GFR calc non Af Amer: >60 mL/min (ref >60)
Glucose, Bld: 144 mg/dL — ABNORMAL HIGH (ref 70–99)
Potassium: 4.1 mmol/L (ref 3.5–5.1)
Sodium: 137 mmol/L (ref 135–145)

## 2020-08-02 LAB — COMPREHENSIVE METABOLIC PANEL
ALT: 22 U/L (ref 0–44)
AST: 29 U/L (ref 15–41)
Albumin: 4.2 g/dL (ref 3.5–5.0)
Alkaline Phosphatase: 85 U/L (ref 38–126)
Anion gap: 14 (ref 5–15)
BUN: 16 mg/dL (ref 6–20)
CO2: 19 mmol/L — ABNORMAL LOW (ref 22–32)
Calcium: 8.8 mg/dL — ABNORMAL LOW (ref 8.9–10.3)
Chloride: 105 mmol/L (ref 98–111)
Creatinine, Ser: 1.14 mg/dL (ref 0.61–1.24)
GFR calc Af Amer: >60 mL/min (ref >60)
GFR calc non Af Amer: >60 mL/min (ref >60)
Glucose, Bld: 189 mg/dL — ABNORMAL HIGH (ref 70–99)
Potassium: 3.4 mmol/L — ABNORMAL LOW (ref 3.5–5.1)
Sodium: 138 mmol/L (ref 135–145)
Total Bilirubin: 1 mg/dL (ref 0.3–1.2)
Total Protein: 7.5 g/dL (ref 6.5–8.1)

## 2020-08-02 LAB — CBC WITH DIFFERENTIAL/PLATELET
Abs Immature Granulocytes: 0.08 10*3/uL — ABNORMAL HIGH (ref 0.00–0.07)
Basophils Absolute: 0 10*3/uL (ref 0.0–0.1)
Basophils Relative: 0 %
Eosinophils Absolute: 0 10*3/uL (ref 0.0–0.5)
Eosinophils Relative: 0 %
HCT: 45.3 % (ref 39.0–52.0)
Hemoglobin: 15 g/dL (ref 13.0–17.0)
Immature Granulocytes: 1 %
Lymphocytes Relative: 9 %
Lymphs Abs: 1.5 10*3/uL (ref 0.7–4.0)
MCH: 29.4 pg (ref 26.0–34.0)
MCHC: 33.1 g/dL (ref 30.0–36.0)
MCV: 88.6 fL (ref 80.0–100.0)
Monocytes Absolute: 1.2 10*3/uL — ABNORMAL HIGH (ref 0.1–1.0)
Monocytes Relative: 7 %
Neutro Abs: 13.4 10*3/uL — ABNORMAL HIGH (ref 1.7–7.7)
Neutrophils Relative %: 83 %
Platelets: 197 10*3/uL (ref 150–400)
RBC: 5.11 MIL/uL (ref 4.22–5.81)
RDW: 14.8 % (ref 11.5–15.5)
WBC: 16.3 10*3/uL — ABNORMAL HIGH (ref 4.0–10.5)
nRBC: 0 % (ref 0.0–0.2)

## 2020-08-02 LAB — MAGNESIUM: Magnesium: 2.2 mg/dL (ref 1.7–2.4)

## 2020-08-02 LAB — ECHOCARDIOGRAM COMPLETE
Area-P 1/2: 5.13 cm2
Height: 72 in
MV M vel: 4.27 m/s
MV Peak grad: 72.8 mmHg
S' Lateral: 5 cm
Weight: 2980.62 oz

## 2020-08-02 LAB — CBC
HCT: 51.3 % (ref 39.0–52.0)
Hemoglobin: 16.6 g/dL (ref 13.0–17.0)
MCH: 29.6 pg (ref 26.0–34.0)
MCHC: 32.4 g/dL (ref 30.0–36.0)
MCV: 91.4 fL (ref 80.0–100.0)
Platelets: 218 10*3/uL (ref 150–400)
RBC: 5.61 MIL/uL (ref 4.22–5.81)
RDW: 15 % (ref 11.5–15.5)
WBC: 15.8 10*3/uL — ABNORMAL HIGH (ref 4.0–10.5)
nRBC: 0 % (ref 0.0–0.2)

## 2020-08-02 LAB — POCT ACTIVATED CLOTTING TIME
Activated Clotting Time: 329 seconds
Activated Clotting Time: 373 seconds

## 2020-08-02 LAB — HEMOGLOBIN A1C
Hgb A1c MFr Bld: 5.8 % — ABNORMAL HIGH (ref 4.8–5.6)
Mean Plasma Glucose: 119.76 mg/dL

## 2020-08-02 LAB — SARS CORONAVIRUS 2 BY RT PCR (HOSPITAL ORDER, PERFORMED IN ~~LOC~~ HOSPITAL LAB): SARS Coronavirus 2: NEGATIVE

## 2020-08-02 LAB — LIPID PANEL
Cholesterol: 178 mg/dL (ref 0–200)
HDL: 40 mg/dL — ABNORMAL LOW (ref >40)
LDL Cholesterol: 123 mg/dL — ABNORMAL HIGH (ref 0–99)
Total CHOL/HDL Ratio: 4.5 RATIO
Triglycerides: 75 mg/dL (ref <150)
VLDL: 15 mg/dL (ref 0–40)

## 2020-08-02 LAB — GLUCOSE, CAPILLARY
Glucose-Capillary: 134 mg/dL — ABNORMAL HIGH (ref 70–99)
Glucose-Capillary: 201 mg/dL — ABNORMAL HIGH (ref 70–99)
Glucose-Capillary: 153 mg/dL — ABNORMAL HIGH (ref 70–99)

## 2020-08-02 LAB — POTASSIUM: Potassium: 4.2 mmol/L (ref 3.5–5.1)

## 2020-08-02 LAB — MRSA PCR SCREENING: MRSA by PCR: NEGATIVE

## 2020-08-02 MED ORDER — POTASSIUM CHLORIDE 20 MEQ/15ML (10%) PO SOLN
40.0000 meq | Freq: Every day | ORAL | Status: DC
  Administered 2020-08-03 – 2020-08-04 (×2): 40 meq via ORAL
  Filled 2020-08-02 (×2): qty 30

## 2020-08-02 MED ORDER — HYDRALAZINE HCL 20 MG/ML IJ SOLN: 10.0000 mg | INTRAMUSCULAR | Status: AC | PRN

## 2020-08-02 MED ORDER — LOSARTAN POTASSIUM 25 MG PO TABS: 25.0000 mg | ORAL_TABLET | Freq: Every day | ORAL | 2 refills | Status: DC

## 2020-08-02 MED ORDER — POTASSIUM CHLORIDE CRYS ER 20 MEQ PO TBCR: 20.0000 meq | EXTENDED_RELEASE_TABLET | Freq: Two times a day (BID) | ORAL | 2 refills | Status: DC

## 2020-08-02 MED ORDER — PNEUMOCOCCAL VAC POLYVALENT 25 MCG/0.5ML IJ INJ: 0.5000 mL | INJECTION | INTRAMUSCULAR | Status: DC | PRN

## 2020-08-02 MED ORDER — METOPROLOL SUCCINATE ER 50 MG PO TB24: 50.0000 mg | ORAL_TABLET | Freq: Every day | ORAL | 2 refills | Status: DC

## 2020-08-02 MED ORDER — NITROGLYCERIN 1 MG/10 ML FOR IR/CATH LAB
INTRA_ARTERIAL | Status: AC
  Filled 2020-08-02: qty 10

## 2020-08-02 MED ORDER — ACETAMINOPHEN 325 MG PO TABS
650.0000 mg | ORAL_TABLET | ORAL | Status: DC | PRN
  Administered 2020-08-03: 650 mg via ORAL
  Filled 2020-08-02: qty 2

## 2020-08-02 MED ORDER — METOPROLOL TARTRATE 25 MG PO TABS
25.0000 mg | ORAL_TABLET | Freq: Two times a day (BID) | ORAL | Status: DC
  Administered 2020-08-02: 25 mg via ORAL
  Filled 2020-08-02: qty 1

## 2020-08-02 MED ORDER — CHLORHEXIDINE GLUCONATE CLOTH 2 % EX PADS
6.0000 | MEDICATED_PAD | Freq: Every day | CUTANEOUS | Status: DC
  Administered 2020-08-02 – 2020-08-04 (×3): 6 via TOPICAL

## 2020-08-02 MED ORDER — IOHEXOL 350 MG/ML SOLN
INTRAVENOUS | Status: DC | PRN
  Administered 2020-08-02: 75 mL via INTRA_ARTERIAL

## 2020-08-02 MED ORDER — SODIUM CHLORIDE 0.9 % IV SOLN: INTRAVENOUS | Status: AC

## 2020-08-02 MED ORDER — SODIUM CHLORIDE 0.9% FLUSH: 3.0000 mL | INTRAVENOUS | Status: DC | PRN

## 2020-08-02 MED ORDER — TICAGRELOR 90 MG PO TABS
90.0000 mg | ORAL_TABLET | Freq: Two times a day (BID) | ORAL | Status: DC
  Administered 2020-08-03 – 2020-08-04 (×3): 90 mg via ORAL
  Filled 2020-08-02 (×3): qty 1

## 2020-08-02 MED ORDER — ASPIRIN EC 81 MG PO TBEC
81.0000 mg | DELAYED_RELEASE_TABLET | Freq: Every day | ORAL | Status: DC
  Administered 2020-08-03 – 2020-08-04 (×2): 81 mg via ORAL
  Filled 2020-08-02 (×3): qty 1

## 2020-08-02 MED ORDER — ONDANSETRON HCL 4 MG/2ML IJ SOLN: 4.0000 mg | Freq: Four times a day (QID) | INTRAMUSCULAR | Status: DC | PRN

## 2020-08-02 MED ORDER — ROSUVASTATIN CALCIUM 40 MG PO TABS: 40.0000 mg | ORAL_TABLET | Freq: Every day | ORAL | 2 refills | Status: DC

## 2020-08-02 MED ORDER — SODIUM CHLORIDE 0.9% FLUSH
3.0000 mL | Freq: Two times a day (BID) | INTRAVENOUS | Status: DC
  Administered 2020-08-02 (×2): 3 mL via INTRAVENOUS

## 2020-08-02 MED ORDER — ROSUVASTATIN CALCIUM 20 MG PO TABS
20.0000 mg | ORAL_TABLET | Freq: Every day | ORAL | Status: DC
  Administered 2020-08-02 – 2020-08-04 (×3): 20 mg via ORAL
  Filled 2020-08-02 (×3): qty 1

## 2020-08-02 MED ORDER — NITROGLYCERIN 1 MG/10 ML FOR IR/CATH LAB
INTRA_ARTERIAL | Status: DC | PRN
  Administered 2020-08-02: 100 ug via INTRACORONARY

## 2020-08-02 MED ORDER — SODIUM CHLORIDE 0.9 % IV SOLN
250.0000 mL | INTRAVENOUS | Status: DC | PRN
  Administered 2020-08-02: 250 mL via INTRAVENOUS

## 2020-08-02 MED ORDER — MAGNESIUM SULFATE 4 GM/100ML IV SOLN
INTRAVENOUS | Status: AC
  Administered 2020-08-02: 4 g via INTRAVENOUS
  Filled 2020-08-02: qty 100

## 2020-08-02 MED ORDER — LABETALOL HCL 5 MG/ML IV SOLN: 10.0000 mg | INTRAVENOUS | Status: AC | PRN

## 2020-08-02 MED ORDER — MAGNESIUM SULFATE 4 GM/100ML IV SOLN: 4.0000 g | Freq: Once | INTRAVENOUS | Status: AC

## 2020-08-02 MED ORDER — TICAGRELOR 90 MG PO TABS: 90.0000 mg | ORAL_TABLET | Freq: Two times a day (BID) | ORAL | 2 refills | Status: DC

## 2020-08-02 MED ORDER — POTASSIUM CHLORIDE 20 MEQ/15ML (10%) PO SOLN
40.0000 meq | Freq: Once | ORAL | Status: AC
  Administered 2020-08-02: 40 meq via ORAL
  Filled 2020-08-02: qty 30

## 2020-08-02 MED ORDER — METOPROLOL SUCCINATE ER 50 MG PO TB24
50.0000 mg | ORAL_TABLET | Freq: Every day | ORAL | Status: DC
  Administered 2020-08-02 – 2020-08-03 (×2): 50 mg via ORAL
  Filled 2020-08-02 (×2): qty 1

## 2020-08-02 MED ORDER — LOSARTAN POTASSIUM 25 MG PO TABS
25.0000 mg | ORAL_TABLET | Freq: Every day | ORAL | Status: DC
  Administered 2020-08-02 – 2020-08-04 (×3): 25 mg via ORAL
  Filled 2020-08-02 (×3): qty 1

## 2020-08-02 MED ORDER — ASPIRIN 81 MG PO TBEC: 81.0000 mg | DELAYED_RELEASE_TABLET | Freq: Every day | ORAL | 2 refills | Status: DC

## 2020-08-02 NOTE — Progress Notes (Signed)
[facility] Progress Note Patient Name: Mike [name] DOB: 05/13/1961 MRN: [id number]   Date of Service  08/02/2020  HPI/Events of Note  E link notified of cardiac arrest. Ground team alerted as well. When I camera'd in, patient was with HR 102 and awake with stable vitals. I was told he had gone into VT and then torsades and was shocked once before we were called.   eICU Interventions  Patient with stable vitals, asked RN to check labs now, cardiology to be called      Intervention Category Major Interventions: Arrhythmia - evaluation and management  Margaretmary Lombard 08/02/2020, 3:18 AM

## 2020-08-02 NOTE — Code Documentation (Signed)
CODE BLUE NOTE  Patient Name: Mike [name]   MRN: [id number]   Date of Birth/ Sex: [birth year] , male      Admission Date: 08/01/2020  Attending Provider: Nigel Mormon, MD  Primary Diagnosis: <principal problem not specified>    Indication: Pt was in his usual state of health until this PM, when he was found unresponsive by the nurse who performed 1 round of CPR. Code blue was subsequently called. Rhythm strip indicated possible Torsades de Pointes. He was defibrillated one time before achieving ROSC. At the time of arrival on scene, pt was responsive and communicating with staff.  Pt denied pain and did not have any recollection of the previous events.    Technical Description:  - CPR performance duration:  2 minutes  - Was defibrillation or cardioversion used? Yes   - Was external pacer placed? No  - Was patient intubated pre/post CPR? No    Medications Administered: Y = Yes; Blank = No Amiodarone    Atropine    Calcium    Epinephrine    Lidocaine    Magnesium    Norepinephrine    Phenylephrine    Sodium bicarbonate    Vasopressin      Post CPR evaluation:  - Final Status - Was patient successfully resuscitated ? Yes - What is current rhythm? NSR - What is current hemodynamic status? Stable - cardiology team paged and informed of pt's status.    Miscellaneous Information:  - Labs sent, including: Cmp, mg,   - Primary team notified?  Yes  - Family Notified? no  - Additional notes/ transfer status: Pt stable.          Benay Pike, MD  08/02/2020, 3:30 AM

## 2020-08-02 NOTE — Progress Notes (Signed)
When patient asked if he would like [place] to notify his sister of events he declined.

## 2020-08-02 NOTE — Progress Notes (Signed)
At 0300 RN in the room to further deflate TR band. After exiting the room, monitor began to alarm. It was noted that the patient was in torsades @ [phone]. Upon entering the rrom patient noted to have agonal respirations, be unresponsive and no pulse was noted.At 0313 CPR was started and a code was called. At [phone] patient was shocked using 120 joules with chest compressions  Immediately restarted. Pt became arousable and was alert and oriented, but sleepy. Pt has no recollection of events. No mediations were given, labs were drawn, and cardiology was paged. Noted below are the lab results prior to code.   Results for Mike [name], Mike [name] (MRN [id number]) as of 08/02/2020 03:40  Ref. Range 08/02/2020 00:26  Sodium Latest Ref Range: 135 - 145 mmol/L 138  Potassium Latest Ref Range: 3.5 - 5.1 mmol/L 3.4 (L)  Chloride Latest Ref Range: 98 - 111 mmol/L 105  CO2 Latest Ref Range: 22 - 32 mmol/L 19 (L)  Glucose Latest Ref Range: 70 - 99 mg/dL 189 (H)  BUN Latest Ref Range: 6 - 20 mg/dL 16  Creatinine Latest Ref Range: 0.61 - 1.24 mg/dL 1.14  Calcium Latest Ref Range: 8.9 - 10.3 mg/dL 8.8 (L)  Anion gap Latest Ref Range: 5 - 15  14  Alkaline Phosphatase Latest Ref Range: 38 - 126 U/L 85  Albumin Latest Ref Range: 3.5 - 5.0 g/dL 4.2  AST Latest Ref Range: 15 - 41 U/L 29  ALT Latest Ref Range: 0 - 44 U/L 22  Total Protein Latest Ref Range: 6.5 - 8.1 g/dL 7.5  Total Bilirubin Latest Ref Range: 0.3 - 1.2 mg/dL 1.0  GFR, Est Non African American Latest Ref Range: >60 mL/min >60  GFR, Est African American Latest Ref Range: >60 mL/min >60  WBC Latest Ref Range: 4.0 - 10.5 K/uL 15.8 (H)  RBC Latest Ref Range: 4.22 - 5.81 MIL/uL 5.61  Hemoglobin Latest Ref Range: 13.0 - 17.0 g/dL 16.6  HCT Latest Ref Range: 39 - 52 % 51.3  MCV Latest Ref Range: 80.0 - 100.0 fL 91.4  MCH Latest Ref Range: 26.0 - 34.0 pg 29.6  MCHC Latest Ref Range: 30.0 - 36.0 g/dL 32.4  RDW Latest Ref Range: 11.5 - 15.5 % 15.0  Platelets Latest  Ref Range: 150 - 400 K/uL 218  nRBC Latest Ref Range: 0.0 - 0.2 % 0.0

## 2020-08-02 NOTE — Plan of Care (Signed)
Pt in 2heart after being admitted with a STEMI. Pt has successful pplacement of a drug eluting stent. While in 2 heart pt had an episode of pulseless torsades. CPR was initiated and a code blue was called. Patient to remain in 2heart for another day for closer monitoring.

## 2020-08-02 NOTE — Progress Notes (Signed)
Chaplain engaged in initial visit with [name].  Chaplain completed check-in regarding the code blue called for him last night.  Mike [name] expressed that he is feeling much better and that he actually was dreaming at the time of the event.  He woke up very surprised to see everyone in his room.  [name] also revealed the plan of care for him which revealed that he will be be moving to a stepdown unit then possibly discharging home within the next day.  [name] also discussed what it looks like for him to return home and recover.  Chaplain offered support, presence, and listening.  Chaplain affirmed [name]'s recovery and offered blessing over his journey.  Chaplain will follow-up as needed.

## 2020-08-02 NOTE — Progress Notes (Signed)
TR band removed. Gauze and occlusive dressing applied over site.

## 2020-08-02 NOTE — Progress Notes (Signed)
Mike [name] with [employer] called that she could not bring the ordered life vest because it has not yet been approved by insurance. Please call her at [phone] if you have any questions.

## 2020-08-02 NOTE — Progress Notes (Addendum)
NCM received call from [employer]/ Dorian Pod. Dorian Pod made NCM aware needing correct birthday noted on application for life vest from MD. MD made aware. Whitman Hero RN,[name],CM

## 2020-08-02 NOTE — Progress Notes (Signed)
EF 30-35%. VFib/Torsades arrest post successful revascularization. Will place LifeVest. Repeat echocardiogram in 40 days.   Nigel Mormon, MD [facility] Pager: [phone] Office: [phone]

## 2020-08-02 NOTE — Progress Notes (Signed)
  Echocardiogram 2D Echocardiogram has been performed.  Mike [name] 08/02/2020, 10:20 AM

## 2020-08-02 NOTE — Progress Notes (Signed)
Chaplain responded to this code blue.  Upon arrival team had patient stable.  Chaplain available for support as needed. [place], MDiv.    08/02/20 0325  Clinical Encounter Type  Visited With Health care provider  Visit Type Code  Referral From Nurse  Consult/Referral To Chaplain

## 2020-08-02 NOTE — Progress Notes (Signed)
I was notified about the code blue event with presenting rhythm being Torsades. Patient now has successful ROSC, back to baseline.  K is 3.4, not 2.8. K 2.8 and Na 119 were likely erroneous on Istat labs. Nonetheless, K 3.4 and possibly low Mg in the setting of recent STEMI likely led to his Torsades event.   Recommend the following: KCl 40 mEQ once KDur 40 mEQ PO daily Mag 4 g IV once Repeat K and Mg at 7 AM Continue ICU monitoring for now.  Nigel Mormon, MD [facility]. [place] Pager: [phone] Office: [phone]

## 2020-08-02 NOTE — Progress Notes (Signed)
Referral made with [employer]/ Dorian Pod for life vest per MD'S request. TOC will continue to monitor. Whitman Hero RN,[name],CM [phone]

## 2020-08-02 NOTE — Progress Notes (Signed)
Mike Jefferson, RN on [place] and gave report on patient. Patient was transported to [facility] by [facility].

## 2020-08-03 ENCOUNTER — Encounter (HOSPITAL_COMMUNITY)
Admission: EM | Disposition: A | Discharge status: Home / Self Care | Payer: Self-pay | Source: Home / Self Care | Attending: Cardiology

## 2020-08-03 ENCOUNTER — Inpatient Hospital Stay (HOSPITAL_COMMUNITY): Payer: BC Managed Care – PPO

## 2020-08-03 HISTORY — PX: CORONARY STENT INTERVENTION: CATH118234

## 2020-08-03 HISTORY — PX: LEFT HEART CATH AND CORONARY ANGIOGRAPHY: CATH118249

## 2020-08-03 LAB — BASIC METABOLIC PANEL
Anion gap: 11 (ref 5–15)
BUN: 17 mg/dL (ref 6–20)
CO2: 19 mmol/L — ABNORMAL LOW (ref 22–32)
Calcium: 8.6 mg/dL — ABNORMAL LOW (ref 8.9–10.3)
Chloride: 109 mmol/L (ref 98–111)
Creatinine, Ser: 1.02 mg/dL (ref 0.61–1.24)
GFR calc Af Amer: >60 mL/min (ref >60)
GFR calc non Af Amer: >60 mL/min (ref >60)
Glucose, Bld: 114 mg/dL — ABNORMAL HIGH (ref 70–99)
Potassium: 4.8 mmol/L (ref 3.5–5.1)
Sodium: 139 mmol/L (ref 135–145)

## 2020-08-03 LAB — BRAIN NATRIURETIC PEPTIDE: B Natriuretic Peptide: 751.4 pg/mL — ABNORMAL HIGH (ref 0.0–100.0)

## 2020-08-03 LAB — MAGNESIUM: Magnesium: 2.4 mg/dL (ref 1.7–2.4)

## 2020-08-03 LAB — POCT ACTIVATED CLOTTING TIME
Activated Clotting Time: 279 seconds
Activated Clotting Time: 323 seconds

## 2020-08-03 SURGERY — LEFT HEART CATH AND CORONARY ANGIOGRAPHY
Anesthesia: LOCAL

## 2020-08-03 MED ORDER — HEPARIN SODIUM (PORCINE) 1000 UNIT/ML IJ SOLN
INTRAMUSCULAR | Status: DC | PRN
  Administered 2020-08-03: 4000 [IU] via INTRAVENOUS
  Administered 2020-08-03: 3000 [IU] via INTRAVENOUS
  Administered 2020-08-03: 4000 [IU] via INTRAVENOUS

## 2020-08-03 MED ORDER — ISOSORBIDE MONONITRATE ER 30 MG PO TB24
30.0000 mg | ORAL_TABLET | Freq: Every day | ORAL | 2 refills | Status: DC
Start: 2020-08-03 — End: 2020-08-04

## 2020-08-03 MED ORDER — VERAPAMIL HCL 2.5 MG/ML IV SOLN
INTRAVENOUS | Status: AC
  Filled 2020-08-03: qty 2

## 2020-08-03 MED ORDER — SODIUM CHLORIDE 0.9% FLUSH: 3.0000 mL | INTRAVENOUS | Status: DC | PRN

## 2020-08-03 MED ORDER — POTASSIUM CHLORIDE CRYS ER 20 MEQ PO TBCR: 20.0000 meq | EXTENDED_RELEASE_TABLET | Freq: Two times a day (BID) | ORAL | 2 refills | Status: DC

## 2020-08-03 MED ORDER — FUROSEMIDE 10 MG/ML IJ SOLN
20.0000 mg | Freq: Once | INTRAMUSCULAR | Status: AC
  Administered 2020-08-03: 20 mg via INTRAVENOUS

## 2020-08-03 MED ORDER — HEPARIN (PORCINE) IN NACL 1000-0.9 UT/500ML-% IV SOLN
INTRAVENOUS | Status: AC
  Filled 2020-08-03: qty 500

## 2020-08-03 MED ORDER — NITROGLYCERIN 0.4 MG SL SUBL
0.4000 mg | SUBLINGUAL_TABLET | SUBLINGUAL | 2 refills | Status: DC | PRN
Start: 2020-08-03 — End: 2020-08-04

## 2020-08-03 MED ORDER — HEPARIN SODIUM (PORCINE) 1000 UNIT/ML IJ SOLN
INTRAMUSCULAR | Status: AC
  Filled 2020-08-03: qty 1

## 2020-08-03 MED ORDER — NITROGLYCERIN 1 MG/10 ML FOR IR/CATH LAB
INTRA_ARTERIAL | Status: AC
  Filled 2020-08-03: qty 10

## 2020-08-03 MED ORDER — ASPIRIN 81 MG PO TBEC: 81.0000 mg | DELAYED_RELEASE_TABLET | Freq: Every day | ORAL | 1 refills | Status: DC

## 2020-08-03 MED ORDER — SODIUM CHLORIDE 0.9 % IV SOLN: 250.0000 mL | INTRAVENOUS | Status: DC | PRN

## 2020-08-03 MED ORDER — TICAGRELOR 90 MG PO TABS: 90.0000 mg | ORAL_TABLET | Freq: Two times a day (BID) | ORAL | 1 refills | Status: DC

## 2020-08-03 MED ORDER — ASPIRIN 81 MG PO CHEW: 81.0000 mg | CHEWABLE_TABLET | ORAL | Status: DC

## 2020-08-03 MED ORDER — FUROSEMIDE 10 MG/ML IJ SOLN
INTRAMUSCULAR | Status: AC
  Filled 2020-08-03: qty 2

## 2020-08-03 MED ORDER — LIDOCAINE HCL (PF) 1 % IJ SOLN
INTRAMUSCULAR | Status: AC
  Filled 2020-08-03: qty 30

## 2020-08-03 MED ORDER — ROSUVASTATIN CALCIUM 40 MG PO TABS: 40.0000 mg | ORAL_TABLET | Freq: Every day | ORAL | 1 refills | Status: DC

## 2020-08-03 MED ORDER — SODIUM CHLORIDE 0.9% FLUSH
3.0000 mL | Freq: Two times a day (BID) | INTRAVENOUS | Status: DC
  Administered 2020-08-03 – 2020-08-04 (×2): 3 mL via INTRAVENOUS

## 2020-08-03 MED ORDER — FUROSEMIDE 10 MG/ML IJ SOLN
INTRAMUSCULAR | Status: DC | PRN
  Administered 2020-08-03: 20 mg via INTRAVENOUS

## 2020-08-03 MED ORDER — HEPARIN (PORCINE) IN NACL 1000-0.9 UT/500ML-% IV SOLN
INTRAVENOUS | Status: DC | PRN
  Administered 2020-08-03 (×2): 500 mL

## 2020-08-03 MED ORDER — LOSARTAN POTASSIUM 25 MG PO TABS: 25.0000 mg | ORAL_TABLET | Freq: Every day | ORAL | 1 refills | Status: DC

## 2020-08-03 MED ORDER — NITROGLYCERIN 1 MG/10 ML FOR IR/CATH LAB
INTRA_ARTERIAL | Status: DC | PRN
  Administered 2020-08-03 (×2): 200 ug via INTRACORONARY

## 2020-08-03 MED ORDER — VERAPAMIL HCL 2.5 MG/ML IV SOLN
INTRAVENOUS | Status: DC | PRN
  Administered 2020-08-03: 10 mL via INTRA_ARTERIAL

## 2020-08-03 MED ORDER — HYDRALAZINE HCL 20 MG/ML IJ SOLN: 10.0000 mg | INTRAMUSCULAR | Status: AC | PRN

## 2020-08-03 MED ORDER — SODIUM CHLORIDE 0.9% FLUSH: 3.0000 mL | Freq: Two times a day (BID) | INTRAVENOUS | Status: DC

## 2020-08-03 MED ORDER — SODIUM CHLORIDE 0.9 % WEIGHT BASED INFUSION: 3.0000 mL/kg/h | INTRAVENOUS | Status: DC

## 2020-08-03 MED ORDER — FUROSEMIDE 10 MG/ML IJ SOLN
INTRAMUSCULAR | Status: AC
  Filled 2020-08-03: qty 4

## 2020-08-03 MED ORDER — NICOTINE 7 MG/24HR TD PT24
7.0000 mg | MEDICATED_PATCH | TRANSDERMAL | 2 refills | Status: DC
Start: 2020-08-03 — End: 2020-08-04

## 2020-08-03 MED ORDER — IOHEXOL 350 MG/ML SOLN
INTRAVENOUS | Status: DC | PRN
  Administered 2020-08-03: 120 mL via INTRA_ARTERIAL

## 2020-08-03 MED ORDER — LABETALOL HCL 5 MG/ML IV SOLN: 10.0000 mg | INTRAVENOUS | Status: AC | PRN

## 2020-08-03 MED ORDER — METOPROLOL SUCCINATE ER 50 MG PO TB24: 50.0000 mg | ORAL_TABLET | Freq: Every day | ORAL | 1 refills | Status: DC

## 2020-08-03 MED ORDER — SODIUM CHLORIDE 0.9 % WEIGHT BASED INFUSION: 1.0000 mL/kg/h | INTRAVENOUS | Status: DC

## 2020-08-03 MED ORDER — ACETAMINOPHEN 325 MG PO TABS: 650.0000 mg | ORAL_TABLET | ORAL | Status: DC | PRN

## 2020-08-03 MED ORDER — ONDANSETRON HCL 4 MG/2ML IJ SOLN: 4.0000 mg | Freq: Four times a day (QID) | INTRAMUSCULAR | Status: DC | PRN

## 2020-08-03 SURGICAL SUPPLY — 23 items
BALLN SAPPHIRE 2.0X15 (BALLOONS) ×2
BALLN SAPPHIRE 2.5X15 (BALLOONS) ×2
BALLN SAPPHIRE 2.5X30 (BALLOONS) ×2
BALLOON SAPPHIRE 2.0X15 (BALLOONS) ×1 IMPLANT
BALLOON SAPPHIRE 2.5X15 (BALLOONS) ×1 IMPLANT
BALLOON SAPPHIRE 2.5X30 (BALLOONS) ×1 IMPLANT
CATH LAUNCHER 6FR EBU 3 (CATHETERS) ×2 IMPLANT
CATH LAUNCHER 6FR EBU3.5 (CATHETERS) ×2 IMPLANT
CATH OPTITORQUE TIG 4.0 5F (CATHETERS) ×2 IMPLANT
DEVICE RAD COMP TR BAND LRG (VASCULAR PRODUCTS) ×4 IMPLANT
ELECT DEFIB PAD ADLT CADENCE (PAD) ×2 IMPLANT
GLIDESHEATH SLEND A-KIT 6F 22G (SHEATH) ×2 IMPLANT
GUIDEWIRE INQWIRE 1.5J.035X260 (WIRE) ×1 IMPLANT
INQWIRE 1.5J .035X260CM (WIRE) ×2
KIT ENCORE 26 ADVANTAGE (KITS) ×2 IMPLANT
KIT HEART LEFT (KITS) ×2 IMPLANT
KIT HEMO VALVE WATCHDOG (MISCELLANEOUS) ×2 IMPLANT
PACK CARDIAC CATHETERIZATION (CUSTOM PROCEDURE TRAY) ×2 IMPLANT
STENT RESOLUTE ONYX 2.5X18 (Permanent Stent) ×2 IMPLANT
STENT RESOLUTE ONYX 2.5X38 (Permanent Stent) ×2 IMPLANT
TRANSDUCER W/STOPCOCK (MISCELLANEOUS) ×2 IMPLANT
TUBING CIL FLEX 10 FLL-RA (TUBING) ×2 IMPLANT
WIRE COUGAR XT STRL 190CM (WIRE) ×2 IMPLANT

## 2020-08-03 NOTE — Progress Notes (Signed)
Patient having frequent escape beats per CCMD. Patient is asymptomatic.  Will continue to monitor patient.

## 2020-08-03 NOTE — Progress Notes (Signed)
Late entry but upon doing EKG, as Dr. Virgina Jock asked for, I called and spoke with him about subtle changes. Patient stated he had relief from the chest discomfort after the tylenol but still felt as though he wasn't able to cath his breath, sats on 2l anywhere from 90 percent to 94, I increased oxygen to 4L [place]. Patient didn't have any relief after drinking soda. Dr. Virgina Jock ordered IV lasix and I gave it. Patient signed consent for cath. Will closely monitor

## 2020-08-03 NOTE — H&P (View-Only) (Signed)
Recurrent chest pain and shortness of breath. EKG changes not definitive for STEMI, but are new. Plan to re-look at RCA, and consider PCI to OM1.  Nigel Mormon, MD [facility] Pager: [phone] Office: [phone]

## 2020-08-03 NOTE — Progress Notes (Signed)
Subjective:  Chest pain and shortness of breath this morning. Doesn't feel well.  Objective:  Vital Signs in the last 24 hours: Temp:  [98.1 F (36.7 C)-98.8 F (37.1 C)] 98.8 F (37.1 C) (08/06 0508) Pulse Rate:  [55-82] 72 (08/06 1155) Resp:  [10-18] 16 (08/06 1155) BP: (117-169)/(69-92) 139/92 (08/06 1155) SpO2:  [94 %-97 %] 94 % (08/06 1155) Weight:  [84.2 kg] 84.2 kg (08/06 0508)  Intake/Output from previous day: 08/05 0701 - 08/06 0700 In: 104.1 [I.V.:104.1] Out: -   Physical Exam Vitals and nursing note reviewed.  Constitutional:      General: He is in acute distress.     Appearance: He is well-developed.  HENT:     Head: Normocephalic and atraumatic.  Eyes:     Conjunctiva/sclera: Conjunctivae normal.     Pupils: Pupils are equal, round, and reactive to light.  Neck:     Vascular: No JVD.  Cardiovascular:     Rate and Rhythm: Normal rate and regular rhythm.     Pulses: Normal pulses and intact distal pulses.     Heart sounds: No murmur heard.   Pulmonary:     Effort: Respiratory distress present.     Breath sounds: Rales present. No wheezing.  Abdominal:     General: Bowel sounds are normal.     Palpations: Abdomen is soft.     Tenderness: There is no rebound.  Musculoskeletal:        General: No tenderness. Normal range of motion.     Left lower leg: No edema.  Lymphadenopathy:     Cervical: No cervical adenopathy.  Skin:    General: Skin is warm and dry.  Neurological:     Mental Status: He is alert and oriented to person, place, and time.     Cranial Nerves: No cranial nerve deficit.      Lab Results: BMP Recent Labs    08/02/20 0026 08/02/20 0026 08/02/20 0322 08/02/20 0707 08/03/20 0644  NA 138  --  137  --  139  K 3.4*   < > 4.1 4.2 4.8  CL 105  --  108  --  109  CO2 19*  --  17*  --  19*  GLUCOSE 189*  --  144*  --  114*  BUN 16  --  16  --  17  CREATININE 1.14  --  1.00  --  1.02  CALCIUM 8.8*  --  8.6*  --  8.6*  GFRNONAA  >60  --  >60  --  >60  GFRAA >60  --  >60  --  >60   < > = values in this interval not displayed.    CBC Recent Labs  Lab 08/02/20 0322  WBC 16.3*  RBC 5.11  HGB 15.0  HCT 45.3  PLT 197  MCV 88.6  MCH 29.4  MCHC 33.1  RDW 14.8  LYMPHSABS 1.5  MONOABS 1.2*  EOSABS 0.0  BASOSABS 0.0    HEMOGLOBIN A1C Lab Results  Component Value Date   HGBA1C 5.8 (H) 08/02/2020   MPG 119.76 08/02/2020    Cardiac Panel (last 3 results) No results for input(s): CKTOTAL, CKMB, TROPONINI, RELINDX in the last 8760 hours.  BNP (last 3 results) Recent Labs    08/03/20 0644  BNP 751.4*    TSH No results for input(s): TSH in the last 8760 hours.  Lipid Panel     Component Value Date/Time   CHOL 178 08/02/2020 0707   TRIG  75 08/02/2020 0707   HDL 40 (L) 08/02/2020 0707   CHOLHDL 4.5 08/02/2020 0707   VLDL 15 08/02/2020 0707   LDLCALC 123 (H) 08/02/2020 0707     Hepatic Function Panel Recent Labs    08/02/20 0026  PROT 7.5  ALBUMIN 4.2  AST 29  ALT 22  ALKPHOS 85  BILITOT 1.0    Cardiac Studies:  EKG 08/03/2020: Sinus rhythm. Inferior ST elevation and hyperacute T waves in lateral leads-not meeting STEMI criteria, but new since last EKG on 08/02/2020  Echocardiogram 08/01/2020: LM: Distal 30% stenosis LAD: Normal LCx: Subtotally occluded OM2, bridging left-to-left and right-to-left collaterals RCA: Prox 30% stenosis.        Mid 100% occlusion        Successful percutaneous coronary intervention mid RCA        PTCA and stent placement 2.5 X 26 mm Resolute Onyx drug-eluting stent 100%--->0% stenosis. TIMI flow 0-->III   Assessment & Recommendations:  59 y.o. Caucasian male, tobacco dependence, inferior STEMI, s/pcardiac arrest due to Vfib, torsades, HFrEF  Chest pain, shortness of breath: New this morning. Not meeting STEMI criteria, but concerning changes.  Plan to re-look at RCA and attempt PCI to OM1, if RCA stent is patent. Other possibilities are acute HFrEF,  or brilinta induced shortness of breath  HFrEF: Lasix 20 mg. CXR Continue losartan 25 mg. Reduced metoprolol succinate due to 5 sec pause during sleep. Suspect he has OSA. Will need outpatient testing.  S/p Vfib arrest: Keep K around 4, mg around 2.  Will arrange lifevest and repeat echo in 40 days  Tobacco dependence: Nicotine patch   Nigel Mormon, M.D. [facility], [place] Pager: [phone] Office: [phone]

## 2020-08-03 NOTE — Progress Notes (Signed)
Upon rounding on patient, he was complaining of shortness of breath. His sats were 92 % on RA, I applied 02 at [facility] and sats up to 96%. His lungs were clear. He also is complaining of 2/10 chest pain that patient addressed on rounds with MD and he stated that it was probably from the shock. I notified Dr. Virgina Jock of the above issues. He stated to give him some Coke thinking the shortness of breath was from the [place] and do and EKG which we did. He stated to hold the discharge for right now and we would readdress. Patient updated and satisfied. Will closely monitor

## 2020-08-03 NOTE — Progress Notes (Signed)
Notified by CCMD that patient HR 36 at 2338. Pt was asymptomatic. Current HR 72, will continue to monitor patient.

## 2020-08-03 NOTE — Significant Event (Addendum)
Rapid Response Event Note   Reason for Call :  Acute oxygen desaturation accompanied by shortness of breath. 2/10 chest pain, dull.   Initial Focused Assessment:  Pt lying in bed. He is awake, alert. Extremities are warm, dry. Pulses are 2+ palpable. No JVD. Pt now denies pain and denies shortness of breath. Lung sounds are clear. No adventitious heart sounds, rate appears regular. Telemetry reviewed and no arrhythmias noted prior to or during symptom onset.   VS: BP 139/92, HR 72, RR 18, SpO2 94% on 2LNC  Interventions:  -RN completed EKG and followed up with Dr. Virgina Jock regarding EKG results -IV Lasix given  Plan of Care:  -Decision made for pt to have LHC today  Call rapid response for additional needs  Event Summary:  MD Notified: Dr. Virgina Jock notified by primary RN Call Time: 5396 Arrival Time: 7289 End Time: [place], RN

## 2020-08-03 NOTE — Progress Notes (Signed)
CARDIAC REHAB PHASE I   PRE:  Rate/Rhythm: 73 SR  BP:  Sitting: 129/80      SaO2: 93 RA  MODE:  Ambulation: 370 ft   POST:  Rate/Rhythm: 103 ST  BP:  Sitting: 169/85    SaO2: 92 RA  Pt ambulated 3104ft in hallway independently with steady gait.  Pt c/o SOB and leg weakness upon return to room. Pt denies CP. Pt educated on importance of ASA, Brilinta, statin, and NTG. Pt given MI book along with heart healthy diet and smoking cessation tip sheet. Reviewed site care, restrictions, and exercise guidelines. Will refer to CRP II [place]. Pt is interested in participating in Virtual Cardiac and Pulmonary Rehab. Pt advised that Virtual Cardiac and Pulmonary Rehab is provided at no cost to the patient.  Checklist:  1. Pt has smart device  ie smartphone and/or ipad for downloading an app  Yes 2. Reliable internet/wifi service    Yes 3. Understands how to use their smartphone and navigate within an app.  Yes  Pt verbalized understanding and is in agreement.  [id number] Rufina Falco, RN [name] 08/03/2020 9:40 AM

## 2020-08-03 NOTE — Interval H&P Note (Signed)
History and Physical Interval Note:  08/03/2020 12:20 PM  Mike [name]  has presented today for surgery, with the diagnosis of Chest Pain.  The various methods of treatment have been discussed with the patient and family. After consideration of risks, benefits and other options for treatment, the patient has consented to  Procedure(s): LEFT HEART CATH AND CORONARY ANGIOGRAPHY (N/A) as a surgical intervention.  The patient's history has been reviewed, patient examined, no change in status, stable for surgery.  I have reviewed the patient's chart and labs.  Questions were answered to the patient's satisfaction.    2016 Appropriate Use Criteria for Coronary Revascularization in Patients With Acute Coronary Syndrome NSTEMI/UA High Risk (TIMI Score 5-7) NSTEMI/Unstable angina, stabilized patient at high risk Link Here: sistemancia.com Indication:  Revascularization by PCI or CABG of 1 or more arteries in a patient with NSTEMI or unstable angina with Stabilization after presentation High risk for clinical events  A (7) Indication: 16; Score 7   [place]

## 2020-08-03 NOTE — Progress Notes (Signed)
Recurrent chest pain and shortness of breath. EKG changes not definitive for STEMI, but are new. Plan to re-look at RCA, and consider PCI to OM1.  Nigel Mormon, MD [facility] Pager: [phone] Office: [phone]

## 2020-08-03 NOTE — Progress Notes (Signed)
Patient arrived on unit with 2 TR bands on distal right forearm. Hand cyanotic, slightly swollen, patient reported numbness. Capillary refill brisk, less than 3 seconds. Discussed plan to remove TR bands with patient. Patient denied any other symptoms other than very mild chest pain. Discussed stretching of arteries when placing stents and irritation to arteries causing spasms. Instructed to call with any increased pain or other symptoms.  Started deflation at 1700. Patient stated decreased discomfort with deflation. Noted decreased cyanosis in hand during deflation with continued brisk capillary refill. When band removed at 1930 capillary refill brisk, numbness was reported to be very slight and patients hand was only slightly cool and pink. Bruising noted under proximal band and distal to distal band. Bruised areas slightly tender to touch. Report to Estanislado Pandy RN and site observed with her during report.  Patient aware to report any change to nursing.

## 2020-08-04 ENCOUNTER — Encounter: Payer: Self-pay | Admitting: Cardiology

## 2020-08-04 LAB — CBC
HCT: 42.7 % (ref 39.0–52.0)
Hemoglobin: 14 g/dL (ref 13.0–17.0)
MCH: 29.3 pg (ref 26.0–34.0)
MCHC: 32.8 g/dL (ref 30.0–36.0)
MCV: 89.3 fL (ref 80.0–100.0)
Platelets: 183 10*3/uL (ref 150–400)
RBC: 4.78 MIL/uL (ref 4.22–5.81)
RDW: 15 % (ref 11.5–15.5)
WBC: 14.3 10*3/uL — ABNORMAL HIGH (ref 4.0–10.5)
nRBC: 0 % (ref 0.0–0.2)

## 2020-08-04 LAB — BASIC METABOLIC PANEL
Anion gap: 13 (ref 5–15)
BUN: 19 mg/dL (ref 6–20)
CO2: 18 mmol/L — ABNORMAL LOW (ref 22–32)
Calcium: 8.7 mg/dL — ABNORMAL LOW (ref 8.9–10.3)
Chloride: 108 mmol/L (ref 98–111)
Creatinine, Ser: 1.08 mg/dL (ref 0.61–1.24)
GFR calc Af Amer: >60 mL/min (ref >60)
GFR calc non Af Amer: >60 mL/min (ref >60)
Glucose, Bld: 101 mg/dL — ABNORMAL HIGH (ref 70–99)
Potassium: 4 mmol/L (ref 3.5–5.1)
Sodium: 139 mmol/L (ref 135–145)

## 2020-08-04 MED ORDER — NICOTINE 7 MG/24HR TD PT24: 7.0000 mg | MEDICATED_PATCH | TRANSDERMAL | 1 refills | Status: DC

## 2020-08-04 MED ORDER — SPIRONOLACTONE 25 MG PO TABS: 25.0000 mg | ORAL_TABLET | Freq: Every day | ORAL | 2 refills | Status: DC

## 2020-08-04 MED ORDER — METOPROLOL SUCCINATE ER 25 MG PO TB24: 25.0000 mg | ORAL_TABLET | Freq: Every day | ORAL | 1 refills | Status: DC

## 2020-08-04 MED ORDER — SPIRONOLACTONE 25 MG PO TABS
25.0000 mg | ORAL_TABLET | Freq: Every day | ORAL | Status: DC
  Administered 2020-08-04: 25 mg via ORAL
  Filled 2020-08-04: qty 1

## 2020-08-04 MED ORDER — ISOSORBIDE MONONITRATE ER 30 MG PO TB24
30.0000 mg | ORAL_TABLET | Freq: Every day | ORAL | 1 refills | Status: DC
Start: 2020-08-04 — End: 2020-08-14

## 2020-08-04 MED ORDER — NITROGLYCERIN 0.4 MG SL SUBL
0.4000 mg | SUBLINGUAL_TABLET | SUBLINGUAL | 1 refills | Status: DC | PRN
Start: 2020-08-04 — End: 2020-08-14

## 2020-08-04 NOTE — Progress Notes (Signed)
CARDIAC REHAB PHASE I   PRE:  Rate/Rhythm: 70 SR  BP:  Sitting: 134/74      SaO2: 95% RA  MODE:  Ambulation: 400 ft   POST:  Rate/Rhythm: 101 ST  BP:               Sitting: 164/99     SaO2: 93%  Ambulated pt to see if he could maintain O2 stats of 92% or more per RN's request. Pt started at 95% RA. Pt ambulated 439ft with minimal assist. Pt maintained steady gait. Pt was able to maintain O2 stats while ambulating on RA, dropped to 93% during walk. Pt reported slight SOB about halfway of walk. Pt returned to bed, stats dropped for 92% briefly at rest, recovered at 93/94% RA. Call bell within reach.   [phone]  Carma Lair MS, ACSM CEP  10:01 AM 08/04/2020

## 2020-08-04 NOTE — Care Management (Signed)
Patient given Brilinta copay and 30 day free cards.

## 2020-08-04 NOTE — Progress Notes (Signed)
HR 27 nonsustained per CCMD.  Patient in bed with eyes closed.  Will continue to monitor.

## 2020-08-04 NOTE — Discharge Summary (Addendum)
Physician Discharge Summary  Patient ID: Mike [name] MRN: [id number] DOB/AGE: 1961-10-31 59 y.o.  Admit date: 08/01/2020 Discharge date: 08/04/2020  Primary Discharge Diagnosis: STEMI Acute systolic heart failure  Secondary Discharge Diagnosis: Hyperlipidemia Tobacco dependence Hypertension   Hospital Course:   59 y.o. Caucasian male  with tobacco dependence, hyperlipidemia, presented via EMS with chest pain. EKG showed inferior STEMI. He underwent successful primary PCI to RCA, details below. He also was noted to have subtotally occluded OM2, likely chronic.  Echocardiogram showed EF 30-35% with severe inferolateral hypokinesis, moderate MR, likely ischemic.  Initial plan was to discharge patient on 08/03/2020, on medical therapy, with consideration for intubation for subtotally occluded OM2 in future as needed.    Soon after successful intervention, patient had V. Fib/torsades cardiac arrest requiring ACLS and shock with complete ROSC.  This was attributed to recent MI and low EF, as well as hypokalemia.  Patient did not have any recurrence of V. fib or V. tach through rest of the hospital stay.  He will be placed on LifeVest.  Pending insurance approval, this will be performed after discharge.  On 08/03/2020, patient developed shortness of breath and minimal chest pain.  EKG showed lateral ST depression in inferior minimal ST elevation.  He was thus urgently taken back to Cath Lab.  LVEDP was elevated at 42 mmHg.  Suspecting that occluded OM was causing ischemic MR, I decided to intervene on OM2.  He underwent successful intervention with 2 overlapping stents, details below.  He does have residual moderate disease in distal left main, proximal RCA, and mid LAD, for which aggressive medical therapy, and tobacco cessation is recommended at this time.  He was also given IV Lasix.  BNP was elevated at 750.  He diuresed well over the next 24 hours.  On 08/04/2020, he was able to ambulate without any  significant chest pain, shortness of breath, or hypoxia.  Patient did have episodes of bradycardia and up to 5-second sinus pauses during sleep, I suspect this is related to obstructive sleep apnea for which she will need outpatient testing.  Metoprolol succinate was reduced to 25 mg.  He was discharged on the medical therapy below.  Did the patient have an acute coronary syndrome (MI, NSTEMI, STEMI, etc) this admission?: Yes                               AHA/ACC Clinical Performance & Quality Measures: 1. Aspirin prescribed? - Yes 2. ADP Receptor Inhibitor (Plavix/Clopidogrel, Brilinta/Ticagrelor or Effient/Prasugrel) prescribed (includes medically managed patients)? - Yes 3. Beta Blocker prescribed? - Yes 4. High Intensity Statin (Lipitor 40-80mg  or Crestor 20-40mg ) prescribed? - Yes 5. EF assessed during THIS hospitalization? - Yes 6. For EF <40%, was ACEI/ARB prescribed? - Yes 7. For EF <40%, Aldosterone Antagonist (Spironolactone or Eplerenone) prescribed? - Yes 8. Cardiac Rehab Phase II ordered (including medically managed patients)? - Yes     Discharge Exam: Blood pressure 136/69, pulse 78, temperature 98.1 F (36.7 C), temperature source Oral, resp. rate 20, height 6' (1.829 m), weight 82.3 kg, SpO2 94 %.   Physical Exam Vitals and nursing note reviewed.  Constitutional:      General: He is not in acute distress.    Appearance: He is well-developed.  HENT:     Head: Normocephalic and atraumatic.  Eyes:     Conjunctiva/sclera: Conjunctivae normal.     Pupils: Pupils are equal, round, and reactive to light.  Neck:     Vascular: No JVD.  Cardiovascular:     Rate and Rhythm: Normal rate and regular rhythm.     Pulses: Intact distal pulses.          Dorsalis pedis pulses are 1+ on the right side and 1+ on the left side.       Posterior tibial pulses are 0 on the right side and 0 on the left side.     Heart sounds: No murmur heard.   Pulmonary:     Effort: Pulmonary effort  is normal.     Breath sounds: Normal breath sounds. No wheezing or rales.  Chest:     Chest wall: Tenderness present.  Abdominal:     General: Bowel sounds are normal.     Palpations: Abdomen is soft.     Tenderness: There is no rebound.  Musculoskeletal:        General: No tenderness. Normal range of motion.     Left lower leg: No edema.  Lymphadenopathy:     Cervical: No cervical adenopathy.  Skin:    General: Skin is warm and dry.  Neurological:     Mental Status: He is alert and oriented to person, place, and time.     Cranial Nerves: No cranial nerve deficit.      Recommendations on discharge:  Medication compliance Low salt diet   Significant Diagnostic Studies:  EKG 08/04/2020 Sinus rhythm. Minimal ST elevation in lead III, improved since 08/03/2020  Coronary intervention 08/03/2020: LM: Distal 40% stenosis LAD: Mid 30% disease LCx: Subtotally occluded OM2, prox LCx 70% stenosis        Successful percutaneous coronary intervention OM2-Prox LCx        PTCA and overlapping stents placement         2.5 X 38 mm and 2.5 X 18 mm Resolute Onyx drug-eluting stents        100%--->0% stenosis. TIMI flow 0-->III        Small caliber distal vessel with moderate diffuse disease RCA: Prox 30% stenosis.  Patent mid RCA sttent 2.5 X 26 mm Resolute Onyx drug-eluting stent  LVEDP 42 mmHg  Echocardiogram 08/02/2020: 1. Moderate global and severe inferolateral hypokinesis. Left ventricular  ejection fraction, by estimation, is 30 to 35%. The left ventricle has  normal function. The left ventricle demonstrates regional wall motion  abnormalities (see scoring  diagram/findings for description). There is mild left ventricular  hypertrophy. Left ventricular diastolic parameters are consistent with  Grade II diastolic dysfunction (pseudonormalization).  2. Right ventricular systolic function is low normal. The right  ventricular size is normal.  3. Left atrial size was mildly  dilated.  4. The mitral valve is grossly normal. Moderate mitral valve  regurgitation.  5. The aortic valve is tricuspid. Aortic valve regurgitation is not  visualized. Mild aortic valve sclerosis is present, with no evidence of  aortic valve stenosis.  6. The inferior vena cava is dilated in size with >50% respiratory  variability, suggesting right atrial pressure of 8 mmHg.   Coronary intervention 08/01/2020: LM: Distal 30% stenosis LAD: Mid 30% disease LCx: Subtotally occluded OM2, bridging left-to-left and right-to-left collaterals RCA: Prox 30% stenosis.        Mid 100% occlusion        Successful percutaneous coronary intervention mid RCA        PTCA and stent placement 2.5 X 26 mm Resolute Onyx drug-eluting stent 100%--->0% stenosis. TIMI flow 0-->III    EKG 08/02/2020: Sinus rhythm.  Acute inferior infarct   Labs:   Lab Results  Component Value Date   WBC 14.3 (H) 08/04/2020   HGB 14.0 08/04/2020   HCT 42.7 08/04/2020   MCV 89.3 08/04/2020   PLT 183 08/04/2020    Recent Labs  Lab 08/02/20 0026 08/02/20 0322 08/04/20 0426  NA 138   < > 139  K 3.4*   < > 4.0  CL 105   < > 108  CO2 19*   < > 18*  BUN 16   < > 19  CREATININE 1.14   < > 1.08  CALCIUM 8.8*   < > 8.7*  PROT 7.5  --   --   BILITOT 1.0  --   --   ALKPHOS 85  --   --   ALT 22  --   --   AST 29  --   --   GLUCOSE 189*   < > 101*   < > = values in this interval not displayed.    Lipid Panel     Component Value Date/Time   CHOL 178 08/02/2020 0707   TRIG 75 08/02/2020 0707   HDL 40 (L) 08/02/2020 0707   CHOLHDL 4.5 08/02/2020 0707   VLDL 15 08/02/2020 0707   LDLCALC 123 (H) 08/02/2020 0707    BNP (last 3 results) Recent Labs    08/03/20 0644  BNP 751.4*    HEMOGLOBIN A1C Lab Results  Component Value Date   HGBA1C 5.8 (H) 08/02/2020   MPG 119.76 08/02/2020    Cardiac Panel (last 3 results) No results for input(s): CKTOTAL, CKMB, TROPONINI, RELINDX in the last 8760  hours.  No results found for: CKTOTAL, CKMB, CKMBINDEX, TROPONINI   TSH No results for input(s): TSH in the last 8760 hours.  Radiology: DG Chest 1 View  Result Date: 08/03/2020 CLINICAL DATA:  Shortness of breath, chest pain. EXAM: CHEST  1 VIEW COMPARISON:  March 16, 2016. FINDINGS: Mild cardiomegaly is noted. No pneumothorax or pleural effusion is noted. Mild bibasilar atelectasis or edema is noted. The visualized skeletal structures are unremarkable. IMPRESSION: Mild bibasilar subsegmental atelectasis or edema. Electronically Signed   By: Marijo Conception M.D.   On: 08/03/2020 12:52   CARDIAC CATHETERIZATION  Result Date: 08/03/2020 LM: Distal 40% stenosis LAD: Normal LCx: Subtotally occluded OM2, prox LCx 70% stenosis        Successful percutaneous coronary intervention OM2-Prox LCx        PTCA and overlapping stents placement        2.5 X 38 mm and 2.5 X 18 mm Resolute Onyx drug-eluting stents        100%--->0% stenosis. TIMI flow 0-->III        Small caliber distal vessel with moderate diffuse disease RCA: Prox 30% stenosis.         Patent mid RCA sttent 2.5 X 26 mm Resolute Onyx drug-eluting stent LVEDP 42 mmHg Recommend lasix 20 mg bid. Encourage oral hydration, instead of IV hydration. Right wrist hematoma. Additional proximal TR band applied. Monitor for signs of compartment syndrome. Nigel Mormon, MD [facility] [place] Pager: [phone] Office: [phone]    CARDIAC CATHETERIZATION  Result Date: 08/02/2020 LM: Distal 30% stenosis LAD: Normal LCx: Subtotally occluded OM2, bridging left-to-left and right-to-left collaterals RCA: Prox 30% stenosis.        Mid 100% occlusion        Successful percutaneous coronary intervention mid RCA        PTCA and stent  placement 2.5 X 26 mm Resolute Onyx drug-eluting stent 100%--->0% stenosis. TIMI flow 0-->III Nigel Mormon, MD [facility]. [place] Pager: [phone] Office: [phone]    ECHOCARDIOGRAM  COMPLETE  Result Date: 08/02/2020    ECHOCARDIOGRAM REPORT   Patient Name:   Mike [name] Date of Exam: 08/02/2020 Medical Rec #:  [id number]         Height:       72.0 in Accession #:    [id number]        Weight:       186.3 lb Date of Birth:  1961-06-15          BSA:          2.067 m Patient Age:    89 years          BP:           136/87 mmHg Patient Gender: M                 HR:           80 bpm. Exam Location:  Inpatient Procedure: 2D Echo Indications:    acute MI 410  History:        Patient has no prior history of Echocardiogram examinations. No                 significant medical hx on file.  Sonographer:    Jannett Celestine RDCS (AE) Referring Phys: [id number] [place]  1. Moderate global and severe inferolateral hypokinesis. Left ventricular ejection fraction, by estimation, is 30 to 35%. The left ventricle has normal function. The left ventricle demonstrates regional wall motion abnormalities (see scoring diagram/findings for description). There is mild left ventricular hypertrophy. Left ventricular diastolic parameters are consistent with Grade II diastolic dysfunction (pseudonormalization).  2. Right ventricular systolic function is low normal. The right ventricular size is normal.  3. Left atrial size was mildly dilated.  4. The mitral valve is grossly normal. Moderate mitral valve regurgitation.  5. The aortic valve is tricuspid. Aortic valve regurgitation is not visualized. Mild aortic valve sclerosis is present, with no evidence of aortic valve stenosis.  6. The inferior vena cava is dilated in size with >50% respiratory variability, suggesting right atrial pressure of 8 mmHg. FINDINGS  Left Ventricle: Moderate global and severe inferolateral hypokinesis. Left ventricular ejection fraction, by estimation, is 30 to 35%. The left ventricle has normal function. The left ventricle demonstrates regional wall motion abnormalities. The left ventricular internal cavity size was normal in  size. There is mild left ventricular hypertrophy. Left ventricular diastolic parameters are consistent with Grade II diastolic dysfunction (pseudonormalization). Right Ventricle: The right ventricular size is normal. No increase in right ventricular wall thickness. Right ventricular systolic function is low normal. Left Atrium: Left atrial size was mildly dilated. Right Atrium: Right atrial size was normal in size. Pericardium: There is no evidence of pericardial effusion. Mitral Valve: The mitral valve is grossly normal. Moderate mitral valve regurgitation. Tricuspid Valve: The tricuspid valve is grossly normal. Tricuspid valve regurgitation is mild. Aortic Valve: The aortic valve is tricuspid. Aortic valve regurgitation is not visualized. Mild aortic valve sclerosis is present, with no evidence of aortic valve stenosis. Pulmonic Valve: The pulmonic valve was grossly normal. Pulmonic valve regurgitation is not visualized. Aorta: The aortic root and ascending aorta are structurally normal, with no evidence of dilitation. Venous: The inferior vena cava is dilated in size with greater than 50% respiratory variability, suggesting right atrial pressure of  8 mmHg. IAS/Shunts: The interatrial septum was not assessed.  LEFT VENTRICLE PLAX 2D LVIDd:         5.60 cm  Diastology LVIDs:         5.00 cm  LV e' lateral:   4.35 cm/s LV PW:         1.10 cm  LV E/e' lateral: 22.4 LV IVS:        1.10 cm  LV e' medial:    4.13 cm/s LVOT diam:     2.30 cm  LV E/e' medial:  23.6 LV SV:         37 LV SV Index:   18 LVOT Area:     4.15 cm  RIGHT VENTRICLE TAPSE (M-mode): 1.5 cm LEFT ATRIUM           Index       RIGHT ATRIUM           Index LA diam:      5.10 cm 2.47 cm/m  RA Area:     16.50 cm LA Vol (A4C): 75.1 ml 36.33 ml/m RA Volume:   41.60 ml  20.12 ml/m  AORTIC VALVE LVOT Vmax:   59.40 cm/s LVOT Vmean:  34.800 cm/s LVOT VTI:    0.089 m  AORTA Ao Root diam: 3.00 cm MITRAL VALVE MV Area (PHT): 5.13 cm    SHUNTS MV Decel Time:  148 msec    Systemic VTI:  0.09 m MR Peak grad: 72.8 mmHg    Systemic Diam: 2.30 cm MR Mean grad: 50.0 mmHg MR Vmax:      426.50 cm/s MR Vmean:     333.0 cm/s MV E velocity: 97.30 cm/s MV A velocity: 66.40 cm/s MV E/A ratio:  1.47 Alyxandria Wentz MD Electronically signed by Vernell Leep MD Signature Date/Time: 08/02/2020/12:37:32 PM    Final       FOLLOW UP PLANS AND APPOINTMENTS Discharge Instructions    Amb Referral to Cardiac Rehabilitation   Complete by: As directed    Diagnosis:  Coronary Stents STEMI     After initial evaluation and assessments completed: Virtual Based Care may be provided alone or in conjunction with Phase 2 Cardiac Rehab based on patient barriers.: Yes   Diet - low sodium heart healthy   Complete by: As directed    Increase activity slowly   Complete by: As directed      Allergies as of 08/04/2020   No Known Allergies     Medication List    STOP taking these medications   ibuprofen 200 MG tablet Commonly known as: ADVIL   ibuprofen 800 MG tablet Commonly known as: ADVIL     TAKE these medications   aspirin 81 MG EC tablet Take 1 tablet (81 mg total) by mouth daily. Swallow whole.   isosorbide mononitrate 30 MG 24 hr tablet Commonly known as: IMDUR Take 1 tablet (30 mg total) by mouth daily.   losartan 25 MG tablet Commonly known as: COZAAR Take 1 tablet (25 mg total) by mouth daily.   metoprolol succinate 25 MG 24 hr tablet Commonly known as: TOPROL-XL Take 1 tablet (25 mg total) by mouth at bedtime. Take with or immediately following a meal.   nicotine 7 mg/24hr patch Commonly known as: NICODERM CQ - dosed in mg/24 hr Place 1 patch (7 mg total) onto the skin daily.   nitroGLYCERIN 0.4 MG SL tablet Commonly known as: NITROSTAT Place 1 tablet (0.4 mg total) under the tongue every 5 (five) minutes  as needed for chest pain.   rosuvastatin 40 MG tablet Commonly known as: CRESTOR Take 1 tablet (40 mg total) by mouth daily.    spironolactone 25 MG tablet Commonly known as: ALDACTONE Take 1 tablet (25 mg total) by mouth daily.   ticagrelor 90 MG Tabs tablet Commonly known as: BRILINTA Take 1 tablet (90 mg total) by mouth 2 (two) times daily.   tiZANidine 4 MG tablet Commonly known as: Zanaflex Take 1-2 tablets (4-8 mg total) by mouth every 6 (six) hours as needed for muscle spasms.       Follow-up Information    Nigel Mormon, MD Follow up on 08/14/2020.   Specialties: Cardiology, Radiology Why: 3:30 PM Contact information: [address] [place] [address] [phone]              Time spent: 27 min  Vernell Leep MD, [facility] Pager: [phone] Office: [phone] If no answer: [phone]

## 2020-08-06 ENCOUNTER — Encounter (HOSPITAL_COMMUNITY): Payer: Self-pay | Admitting: Cardiology

## 2020-08-06 MED FILL — Lidocaine HCl Local Preservative Free (PF) Inj 1%: INTRAMUSCULAR | Qty: 30 | Status: AC

## 2020-08-07 DIAGNOSIS — I2111 ST elevation (STEMI) myocardial infarction involving right coronary artery: Secondary | ICD-10-CM | POA: Diagnosis not present

## 2020-08-07 DIAGNOSIS — Z9861 Coronary angioplasty status: Secondary | ICD-10-CM | POA: Diagnosis not present

## 2020-08-10 ENCOUNTER — Telehealth (HOSPITAL_COMMUNITY): Payer: Self-pay

## 2020-08-10 ENCOUNTER — Encounter: Payer: Self-pay | Admitting: Cardiology

## 2020-08-10 DIAGNOSIS — I340 Nonrheumatic mitral (valve) insufficiency: Secondary | ICD-10-CM | POA: Insufficient documentation

## 2020-08-10 DIAGNOSIS — I2510 Atherosclerotic heart disease of native coronary artery without angina pectoris: Secondary | ICD-10-CM | POA: Insufficient documentation

## 2020-08-10 DIAGNOSIS — I5020 Unspecified systolic (congestive) heart failure: Secondary | ICD-10-CM | POA: Insufficient documentation

## 2020-08-10 NOTE — Telephone Encounter (Signed)
Called and spoke with pt in regards to CR, pt stated he is not interested at this time due to his work schedule.  Closed referral 

## 2020-08-10 NOTE — Telephone Encounter (Signed)
Pt insurance is active and benefits verified through [place]. Co-pay $0.00, DED $1,000.00/$24.48 met, out of pocket $5,050.00/$25.48 met, co-insurance 20%. No pre-authorization required. [employer], 08/10/20 @ 3:46PM, HOZ#[id number]  Will contact patient to see if he is interested in the Cardiac Rehab Program. If interested, patient will need to complete follow up appt. Once completed, patient will be contacted for scheduling upon review by the RN Navigator.

## 2020-08-14 ENCOUNTER — Ambulatory Visit: Payer: BC Managed Care – PPO | Admitting: Cardiology

## 2020-08-14 ENCOUNTER — Other Ambulatory Visit: Payer: Self-pay

## 2020-08-14 ENCOUNTER — Encounter: Payer: Self-pay | Admitting: Cardiology

## 2020-08-14 VITALS — BP 126/69 | HR 80 | Resp 17 | Ht 72.0 in | Wt 178.0 lb

## 2020-08-14 DIAGNOSIS — R0683 Snoring: Secondary | ICD-10-CM

## 2020-08-14 DIAGNOSIS — I252 Old myocardial infarction: Secondary | ICD-10-CM

## 2020-08-14 DIAGNOSIS — I2510 Atherosclerotic heart disease of native coronary artery without angina pectoris: Secondary | ICD-10-CM | POA: Diagnosis not present

## 2020-08-14 DIAGNOSIS — F17200 Nicotine dependence, unspecified, uncomplicated: Secondary | ICD-10-CM

## 2020-08-14 DIAGNOSIS — I5020 Unspecified systolic (congestive) heart failure: Secondary | ICD-10-CM

## 2020-08-14 DIAGNOSIS — I455 Other specified heart block: Secondary | ICD-10-CM | POA: Diagnosis not present

## 2020-08-14 DIAGNOSIS — E782 Mixed hyperlipidemia: Secondary | ICD-10-CM

## 2020-08-14 DIAGNOSIS — F17210 Nicotine dependence, cigarettes, uncomplicated: Secondary | ICD-10-CM | POA: Diagnosis not present

## 2020-08-14 MED ORDER — TICAGRELOR 90 MG PO TABS: 90.0000 mg | ORAL_TABLET | Freq: Two times a day (BID) | ORAL | 3 refills | Status: DC

## 2020-08-14 MED ORDER — METOPROLOL SUCCINATE ER 25 MG PO TB24: 25.0000 mg | ORAL_TABLET | Freq: Every day | ORAL | 3 refills | Status: DC

## 2020-08-14 MED ORDER — ASPIRIN 81 MG PO TBEC: 81.0000 mg | DELAYED_RELEASE_TABLET | Freq: Every day | ORAL | 3 refills | Status: DC

## 2020-08-14 MED ORDER — NITROGLYCERIN 0.4 MG SL SUBL: 0.4000 mg | SUBLINGUAL_TABLET | SUBLINGUAL | 1 refills | Status: DC | PRN

## 2020-08-14 MED ORDER — NICOTINE POLACRILEX 2 MG MT GUM: 2.0000 mg | CHEWING_GUM | OROMUCOSAL | 2 refills | Status: DC | PRN

## 2020-08-14 MED ORDER — SPIRONOLACTONE 50 MG PO TABS: 50.0000 mg | ORAL_TABLET | Freq: Every day | ORAL | 3 refills | Status: DC

## 2020-08-14 NOTE — Progress Notes (Signed)
Follow up visit  Subjective:   Mike [name], male    DOB: 1961/10/21, 59 y.o.   MRN: [id number]     HPI   Chief Complaint  Patient presents with  . Hospitalization Follow-up  . Coronary Artery Disease    Hospital course: 59 y.o. Caucasian male  with tobacco dependence, hyperlipidemia, presented via EMS with chest pain. EKG showed inferior STEMI. He underwent successful primary PCI to RCA, details below. He also was noted to have subtotally occluded OM2, likely chronic.  Echocardiogram showed EF 30-35% with severe inferolateral hypokinesis, moderate MR, likely ischemic.  Initial plan was to discharge patient on 08/03/2020, on medical therapy, with consideration for intubation for subtotally occluded OM2 in future as needed.    Soon after successful intervention, patient had V. Fib/torsades cardiac arrest requiring ACLS and shock with complete ROSC.  This was attributed to recent MI and low EF, as well as hypokalemia.  Patient did not have any recurrence of V. fib or V. tach through rest of the hospital stay.  He will be placed on LifeVest.  Pending insurance approval, this will be performed after discharge.  On 08/03/2020, patient developed shortness of breath and minimal chest pain.  EKG showed lateral ST depression in inferior minimal ST elevation.  He was thus urgently taken back to Cath Lab.  LVEDP was elevated at 42 mmHg.  Suspecting that occluded OM was causing ischemic MR, I decided to intervene on OM2.  He underwent successful intervention with 2 overlapping stents, details below.  He does have residual moderate disease in distal left main, proximal RCA, and mid LAD, for which aggressive medical therapy, and tobacco cessation is recommended at this time.  He was also given IV Lasix.  BNP was elevated at 750.  He diuresed well over the next 24 hours.  On 08/04/2020, he was able to ambulate without any significant chest pain, shortness of breath, or hypoxia.  Patient did have episodes  of bradycardia and up to 5-second sinus pauses during sleep, I suspect this is related to obstructive sleep apnea for which she will need outpatient testing.  Metoprolol succinate was reduced to 25 mg.  He was discharged on the medical therapy below.  Patient is here for hospital follow up visit.  He is doing well since hospital discharge.  He has not had any chest pain.  He does have exertional dyspnea with more than usual activity.  Denies any leg edema, orthopnea, PND.  He has not had any significant bleeding issues.  He is trying to make changes to his diet.  He is interested in pursuing cardiac rehab.  He is wearing a LifeVest.  While it is uncomfortable to wear especially at night, is ready to continue wearing it.  He has not had any shocks.   Current Outpatient Medications on File Prior to Visit  Medication Sig Dispense Refill  . aspirin 81 MG EC tablet Take 1 tablet (81 mg total) by mouth daily. Swallow whole. 30 tablet 1  . isosorbide mononitrate (IMDUR) 30 MG 24 hr tablet Take 1 tablet (30 mg total) by mouth daily. 30 tablet 1  . losartan (COZAAR) 25 MG tablet Take 1 tablet (25 mg total) by mouth daily. 30 tablet 1  . metoprolol succinate (TOPROL-XL) 25 MG 24 hr tablet Take 1 tablet (25 mg total) by mouth at bedtime. Take with or immediately following a meal. 30 tablet 1  . nitroGLYCERIN (NITROSTAT) 0.4 MG SL tablet Place 1 tablet (0.4 mg total)  under the tongue every 5 (five) minutes as needed for chest pain. 30 tablet 1  . rosuvastatin (CRESTOR) 40 MG tablet Take 1 tablet (40 mg total) by mouth daily. 30 tablet 1  . spironolactone (ALDACTONE) 25 MG tablet Take 1 tablet (25 mg total) by mouth daily. 30 tablet 2  . ticagrelor (BRILINTA) 90 MG TABS tablet Take 1 tablet (90 mg total) by mouth 2 (two) times daily. 60 tablet 1  . nicotine (NICODERM CQ - DOSED IN MG/24 HR) 7 mg/24hr patch Place 1 patch (7 mg total) onto the skin daily. (Patient not taking: Reported on 08/14/2020) 30 patch 1   No  current facility-administered medications on file prior to visit.    Cardiovascular & other pertient studies:  EKG 08/14/2020: Sinus rhythm 81 bpm Old inferior and anterior infarct  EKG 08/04/2020 Sinus rhythm. Minimal ST elevation in lead III, improved since 08/03/2020  Coronary intervention 08/03/2020: LM: Distal 40% stenosis LAD: Mid 30% disease LCx: Subtotally occluded OM2, prox LCx 70% stenosis Successful percutaneous coronary intervention OM2-Prox LCx PTCA and overlapping stents placement  2.5 X 38 mm and 2.5 X 18 mm Resolute Onyx drug-eluting stents 100%--->0% stenosis. TIMI flow 0-->III Small caliber distal vessel with moderate diffuse disease RCA: Prox 30% stenosis. Patent mid RCA sttent 2.5 X 26 mm Resolute Onyx drug-eluting stent  LVEDP 42 mmHg  Echocardiogram 08/02/2020: 1. Moderate global and severe inferolateral hypokinesis. Left ventricular  ejection fraction, by estimation, is 30 to 35%. The left ventricle has  normal function. The left ventricle demonstrates regional wall motion  abnormalities (see scoring  diagram/findings for description). There is mild left ventricular  hypertrophy. Left ventricular diastolic parameters are consistent with  Grade II diastolic dysfunction (pseudonormalization).  2. Right ventricular systolic function is low normal. The right  ventricular size is normal.  3. Left atrial size was mildly dilated.  4. The mitral valve is grossly normal. Moderate mitral valve  regurgitation.  5. The aortic valve is tricuspid. Aortic valve regurgitation is not  visualized. Mild aortic valve sclerosis is present, with no evidence of  aortic valve stenosis.  6. The inferior vena cava is dilated in size with >50% respiratory  variability, suggesting right atrial pressure of 8 mmHg.   Coronary intervention 08/01/2020: LM: Distal 30% stenosis LAD: Mid 30% disease LCx: Subtotally occluded OM2, bridging  left-to-left and right-to-left collaterals RCA: Prox 30% stenosis. Mid 100% occlusion Successful percutaneous coronary intervention mid RCA PTCA and stent placement 2.5 X 26 mm Resolute Onyx drug-eluting stent 100%--->0% stenosis. TIMI flow 0-->III    EKG 08/02/2020: Sinus rhythm. Acute inferior infarct  Recent labs: 08/04/2020: Glucose 101, BUN/Cr 19/1.08. EGFR >60. Na/K 139/4.0. Rest of the CMP normal H/H 14/42. MCV 89. Platelets 184. WBC count 14k HbA1C 5.8% Chol 178, TG 75, HDL 40, LDL 123    Review of Systems  Cardiovascular: Positive for dyspnea on exertion. Negative for chest pain, leg swelling, palpitations and syncope.  Respiratory: Positive for shortness of breath.          Vitals:   08/14/20 0953  BP: 126/69  Pulse: 80  Resp: 17  SpO2: 95%     Body mass index is 24.14 kg/m. Filed Weights   08/14/20 0953  Weight: 178 lb (80.7 kg)     Objective:   Physical Exam Vitals and nursing note reviewed.  Constitutional:      General: He is not in acute distress. Neck:     Vascular: No JVD.  Cardiovascular:  Rate and Rhythm: Normal rate and regular rhythm.     Pulses:          Dorsalis pedis pulses are 1+ on the right side and 1+ on the left side.       Posterior tibial pulses are 0 on the right side and 0 on the left side.     Heart sounds: Normal heart sounds. No murmur heard.   Pulmonary:     Effort: Pulmonary effort is normal.     Breath sounds: Normal breath sounds. No wheezing or rales.           Assessment & Recommendations:   59 year old Caucasian male with coronary artery disease, culprit and nonculprit PCI after STEMI 07/2020, HFrEF, moderate MR, h/o cardiac arrest 2/2 Torsades post PPCI during index hospitalization  CAD: STEMI 07/2020.  Primary PCI to culprit vessel RCA. Resolute Onyx drug-eluting stent 2.5X26 mm DES Recurrent chest pain and shortness of breath requiring nonculprit PCI to OM2 during index  hospitalization.  Successful percutaneous coronary intervention OM2-Prox LCx PTCA and overlapping stents placement  2.5 X 38 mm and 2.5 X 18 mm Resolute Onyx drug-eluting stents 100%--->0% stenosis. TIMI flow 0-->III Small caliber distal vessel with moderate diffuse disease Residual moderate distal left main, prox RCA, and mild mid LAD disease. Recommend DAPT with aspirin and Brilinta at least till 07/2021. Continue losartan 25 mg, metoprolol succinate 25 mg, spironolactone 25 mg, Crestor 40 mg daily. Stop Imdur 30 mg.  H/o cardiac arrest: Torsades post PPCI during index hospitalization with no recurrence Given EF 30-35% post MI, he was recommended LifeVest  HFrEF: EF 30-35% with severe inferolateral hypokinesis, moderate MR likely ischemic. NYHA class II symptoms. Increase spironolactone to 50 mg daily.  Continue losartan 25 mg, metoprolol succinate 25 mg daily. Check BMP. Mag in 1 week.  I expect his EF to improve after complete revascularization and aggressive medical therapy. Repeat echocardiogram in 4 weeks.  If EF not improved, will then switch losartan to Entresto. Patient is being enrolled in EMPACT-MI clinical trial  Hyperlipidemia: LDL 123.  Continue Crestor 40 mg.  Repeat lipid panel in 4 weeks.  Snoring, sinus pauses: Noted to have up to 4-second sinus pauses during sleep.  Suspect he may have obstructive sleep apnea.  Refer to sleep study.  Tobacco dependence: Tobacco cessation counseling:  - Currently smoking 1-2 cigarettes/day   - Patient was informed of the dangers of tobacco abuse including stroke, cancer, and MI, as well as benefits of tobacco cessation. - Patient is willing to quit at this time. - Approximately 5 mins were spent counseling patient cessation techniques. We discussed various methods to help quit smoking, including deciding on a date to quit, joining a support group, pharmacological agents. Patient would like to use  nicotine patch/gum. - I will reassess his progress at the next follow-up visit    Nigel Mormon, MD Pager: [phone] Office: [phone]

## 2020-08-21 DIAGNOSIS — I5020 Unspecified systolic (congestive) heart failure: Secondary | ICD-10-CM | POA: Diagnosis not present

## 2020-08-22 ENCOUNTER — Other Ambulatory Visit: Payer: Self-pay | Admitting: Cardiology

## 2020-08-22 DIAGNOSIS — N179 Acute kidney failure, unspecified: Secondary | ICD-10-CM

## 2020-08-22 DIAGNOSIS — I5020 Unspecified systolic (congestive) heart failure: Secondary | ICD-10-CM

## 2020-08-22 LAB — BASIC METABOLIC PANEL
BUN/Creatinine Ratio: 12 (ref 10–24)
BUN: 17 mg/dL (ref 8–27)
CO2: 18 mmol/L — ABNORMAL LOW (ref 20–29)
Calcium: 9.6 mg/dL (ref 8.6–10.2)
Chloride: 99 mmol/L (ref 96–106)
Creatinine, Ser: 1.46 mg/dL — ABNORMAL HIGH (ref 0.76–1.27)
GFR calc Af Amer: 59 mL/min/{1.73_m2} — ABNORMAL LOW (ref >59)
GFR calc non Af Amer: 51 mL/min/{1.73_m2} — ABNORMAL LOW (ref >59)
Glucose: 112 mg/dL — ABNORMAL HIGH (ref 65–99)
Potassium: 5.4 mmol/L — ABNORMAL HIGH (ref 3.5–5.2)
Sodium: 136 mmol/L (ref 134–144)

## 2020-08-22 LAB — MAGNESIUM: Magnesium: 2.2 mg/dL (ref 1.6–2.3)

## 2020-08-22 NOTE — Progress Notes (Signed)
Creatinine and potassium elevated after increasing spironolactone from 25 to 50 mg. Reduce back to 25 mg. Repeat BMP in 1 week.   Thanks MJP

## 2020-08-24 NOTE — Progress Notes (Signed)
Called patient, NA, LMAM to call back  for lab results.

## 2020-09-07 DIAGNOSIS — Z9861 Coronary angioplasty status: Secondary | ICD-10-CM | POA: Diagnosis not present

## 2020-09-07 DIAGNOSIS — I2111 ST elevation (STEMI) myocardial infarction involving right coronary artery: Secondary | ICD-10-CM | POA: Diagnosis not present

## 2020-09-11 ENCOUNTER — Other Ambulatory Visit: Payer: Self-pay

## 2020-09-11 ENCOUNTER — Ambulatory Visit: Payer: BC Managed Care – PPO

## 2020-09-11 DIAGNOSIS — E782 Mixed hyperlipidemia: Secondary | ICD-10-CM | POA: Diagnosis not present

## 2020-09-11 DIAGNOSIS — I5020 Unspecified systolic (congestive) heart failure: Secondary | ICD-10-CM

## 2020-09-11 DIAGNOSIS — N179 Acute kidney failure, unspecified: Secondary | ICD-10-CM | POA: Diagnosis not present

## 2020-09-11 DIAGNOSIS — I2510 Atherosclerotic heart disease of native coronary artery without angina pectoris: Secondary | ICD-10-CM | POA: Diagnosis not present

## 2020-09-12 LAB — BASIC METABOLIC PANEL
BUN/Creatinine Ratio: 13 (ref 10–24)
BUN: 19 mg/dL (ref 8–27)
CO2: 18 mmol/L — ABNORMAL LOW (ref 20–29)
Calcium: 9.5 mg/dL (ref 8.6–10.2)
Chloride: 101 mmol/L (ref 96–106)
Creatinine, Ser: 1.42 mg/dL — ABNORMAL HIGH (ref 0.76–1.27)
GFR calc Af Amer: 61 mL/min/{1.73_m2} (ref >59)
GFR calc non Af Amer: 53 mL/min/{1.73_m2} — ABNORMAL LOW (ref >59)
Glucose: 114 mg/dL — ABNORMAL HIGH (ref 65–99)
Potassium: 4.7 mmol/L (ref 3.5–5.2)
Sodium: 136 mmol/L (ref 134–144)

## 2020-09-12 LAB — LIPID PANEL
Chol/HDL Ratio: 3.2 ratio (ref 0.0–5.0)
Cholesterol, Total: 95 mg/dL — ABNORMAL LOW (ref 100–199)
HDL: 30 mg/dL — ABNORMAL LOW (ref >39)
LDL Chol Calc (NIH): 49 mg/dL (ref 0–99)
Triglycerides: 76 mg/dL (ref 0–149)
VLDL Cholesterol Cal: 16 mg/dL (ref 5–40)

## 2020-09-19 NOTE — Progress Notes (Addendum)
Follow up visit  Subjective:   Mike [name], male    DOB: 19-Jul-1961, 59 y.o.   MRN: [id number]     HPI   Chief Complaint  Patient presents with  . Coronary Artery Disease  . Follow-up    69 week   58 year old Caucasian male with CAD, culprit (RCA) and nonculprit (LCx) PCI after STEMI 07/2020, HFrEF, moderate MR, h/o cardiac arrest 2/2 Torsades post PPCI during index hospitalization.  Patient has not had any chest pain.  He has not had to use any nitroglycerin since his last visit with me.  However, he endorses exertional dyspnea and claudication with walking 100-200 yards.  He denies any orthopnea, PND, leg edema.  He denies resting pain, nonhealing wounds, ulcers on feet.  Separately, he endorses numbness in both his lower extremities that is constant.  He had similar symptoms in the past, but seems to have worsened in the last few weeks. He also endorses reduced appetite. In addition, he has made changes to his diet. He has cut out red meat, sodas, alcohol. Unfortunately, he continues to smoke 2-6 cigarettes/day.   He is currently wearing LifeVest, which was placed given his cardiac arrest post successful coronary revascularization, reduced EF on echocardiogram.  Recent echocardiogram results reviewed with the patient, details below. Recently, his spironolactone was reduced due to elevated K at 5.5.     Hospital course 07/2020: 59 y.o. Caucasian male  with tobacco dependence, hyperlipidemia, presented via EMS with chest pain. EKG showed inferior STEMI. He underwent successful primary PCI to RCA, details below. He also was noted to have subtotally occluded OM2, likely chronic.  Echocardiogram showed EF 30-35% with severe inferolateral hypokinesis, moderate MR, likely ischemic.  Initial plan was to discharge patient on 08/03/2020, on medical therapy, with consideration for intubation for subtotally occluded OM2 in future as needed.    Soon after successful intervention, patient had  V. Fib/torsades cardiac arrest requiring ACLS and shock with complete ROSC.  This was attributed to recent MI and low EF, as well as hypokalemia.  Patient did not have any recurrence of V. fib or V. tach through rest of the hospital stay.  He will be placed on LifeVest.  Pending insurance approval, this will be performed after discharge.  On 08/03/2020, patient developed shortness of breath and minimal chest pain.  EKG showed lateral ST depression in inferior minimal ST elevation.  He was thus urgently taken back to Cath Lab.  LVEDP was elevated at 42 mmHg.  Suspecting that occluded OM was causing ischemic MR, I decided to intervene on OM2.  He underwent successful intervention with 2 overlapping stents, details below.  He does have residual moderate disease in distal left main, proximal RCA, and mid LAD, for which aggressive medical therapy, and tobacco cessation is recommended at this time.  He was also given IV Lasix.  BNP was elevated at 750.  He diuresed well over the next 24 hours.  On 08/04/2020, he was able to ambulate without any significant chest pain, shortness of breath, or hypoxia.  Patient did have episodes of bradycardia and up to 5-second sinus pauses during sleep, I suspect this is related to obstructive sleep apnea for which she will need outpatient testing.  Metoprolol succinate was reduced to 25 mg.  He was discharged on the medical therapy below.  Patient is here for hospital follow up visit.  He is doing well since hospital discharge.  He has not had any chest pain.  He does have  exertional dyspnea with more than usual activity.  Denies any leg edema, orthopnea, PND.  He has not had any significant bleeding issues.  He is trying to make changes to his diet.  He is interested in pursuing cardiac rehab.  He is wearing a LifeVest.  While it is uncomfortable to wear especially at night, is ready to continue wearing it.  He has not had any shocks.   Current Outpatient Medications on File Prior  to Visit  Medication Sig Dispense Refill  . aspirin 81 MG EC tablet Take 1 tablet (81 mg total) by mouth daily. Swallow whole. 90 tablet 3  . losartan (COZAAR) 25 MG tablet Take 1 tablet (25 mg total) by mouth daily. 30 tablet 1  . metoprolol succinate (TOPROL-XL) 25 MG 24 hr tablet Take 1 tablet (25 mg total) by mouth at bedtime. Take with or immediately following a meal. 90 tablet 3  . nitroGLYCERIN (NITROSTAT) 0.4 MG SL tablet Place 1 tablet (0.4 mg total) under the tongue every 5 (five) minutes as needed for chest pain. 30 tablet 1  . rosuvastatin (CRESTOR) 40 MG tablet Take 1 tablet (40 mg total) by mouth daily. 30 tablet 1  . spironolactone (ALDACTONE) 50 MG tablet Take 1 tablet (50 mg total) by mouth daily. 90 tablet 3  . ticagrelor (BRILINTA) 90 MG TABS tablet Take 1 tablet (90 mg total) by mouth 2 (two) times daily. 180 tablet 3  . nicotine (NICODERM CQ - DOSED IN MG/24 HR) 7 mg/24hr patch Place 1 patch (7 mg total) onto the skin daily. (Patient not taking: Reported on 09/20/2020) 30 patch 1  . nicotine polacrilex (NICORETTE) 2 MG gum Take 1 each (2 mg total) by mouth as needed for smoking cessation. (Patient not taking: Reported on 09/20/2020) 30 tablet 2   No current facility-administered medications on file prior to visit.    Cardiovascular & other pertient studies:  Echocardiogram 09/11/2020:  Left ventricle cavity is moderately dilated. Moderate concentric  hypertrophy of the left ventricle. Moderate global and severe  inferolateral hypokinesis. LVEF 15-20%. Grade 1 diastolic dysfunction.  Normal left atrial pressure. Calculated EF 15%.  Mild to moderate mitral regurgitation.  Inadequate TR jet to estimate pulmonary artery systolic pressure. Normal  right atrial pressure.  Compared to previous study in 03/2020, LVEF is reduced from 30-35%. Mitral  regurgitation is marginally improved.   EKG 08/14/2020: Sinus rhythm 81 bpm Old inferior and anterior infarct  EKG  08/04/2020 Sinus rhythm. Minimal ST elevation in lead III, improved since 08/03/2020  Coronary intervention 08/03/2020: LM: Distal 40% stenosis LAD: Mid 30% disease LCx: Subtotally occluded OM2, prox LCx 70% stenosis Successful percutaneous coronary intervention OM2-Prox LCx PTCA and overlapping stents placement  2.5 X 38 mm and 2.5 X 18 mm Resolute Onyx drug-eluting stents 100%--->0% stenosis. TIMI flow 0-->III Small caliber distal vessel with moderate diffuse disease RCA: Prox 30% stenosis. Patent mid RCA sttent 2.5 X 26 mm Resolute Onyx drug-eluting stent  LVEDP 42 mmHg  Echocardiogram 08/02/2020: 1. Moderate global and severe inferolateral hypokinesis. Left ventricular  ejection fraction, by estimation, is 30 to 35%. The left ventricle has  normal function. The left ventricle demonstrates regional wall motion  abnormalities (see scoring  diagram/findings for description). There is mild left ventricular  hypertrophy. Left ventricular diastolic parameters are consistent with  Grade II diastolic dysfunction (pseudonormalization).  2. Right ventricular systolic function is low normal. The right  ventricular size is normal.  3. Left atrial size was mildly dilated.  4.  The mitral valve is grossly normal. Moderate mitral valve  regurgitation.  5. The aortic valve is tricuspid. Aortic valve regurgitation is not  visualized. Mild aortic valve sclerosis is present, with no evidence of  aortic valve stenosis.  6. The inferior vena cava is dilated in size with >50% respiratory  variability, suggesting right atrial pressure of 8 mmHg.   Coronary intervention 08/01/2020: LM: Distal 30% stenosis LAD: Mid 30% disease LCx: Subtotally occluded OM2, bridging left-to-left and right-to-left collaterals RCA: Prox 30% stenosis. Mid 100% occlusion Successful percutaneous coronary intervention mid RCA PTCA and stent placement 2.5 X  26 mm Resolute Onyx drug-eluting stent 100%--->0% stenosis. TIMI flow 0-->III    EKG 08/02/2020: Sinus rhythm. Acute inferior infarct  Recent labs: 09/11/2020: Glucose 114, BUN/Cr 19/1.42. EGFR 53. Na/K 136/4.7.  Chol 95, TG 76, HDL 30, LDL 49  08/04/2020: Glucose 101, BUN/Cr 19/1.08. EGFR >60. Na/K 139/4.0. Rest of the CMP normal H/H 14/42. MCV 89. Platelets 184. WBC count 14k HbA1C 5.8% Chol 178, TG 75, HDL 40, LDL 123    Review of Systems  Cardiovascular: Positive for dyspnea on exertion. Negative for chest pain, leg swelling, palpitations and syncope.  Respiratory: Positive for shortness of breath.          Vitals:   09/20/20 0928  BP: 126/80  Pulse: 82  Resp: 16  SpO2: 97%     Body mass index is 22.6 kg/m. Filed Weights   09/20/20 0928  Weight: 166 lb 9.6 oz (75.6 kg)     Objective:   Physical Exam Vitals and nursing note reviewed.  Constitutional:      General: He is not in acute distress. Neck:     Vascular: No JVD.  Cardiovascular:     Rate and Rhythm: Normal rate and regular rhythm.     Pulses:          Femoral pulses are 1+ on the right side and 1+ on the left side.      Dorsalis pedis pulses are 1+ on the right side and 1+ on the left side.       Posterior tibial pulses are 0 on the right side and 0 on the left side.     Heart sounds: Normal heart sounds. No murmur heard.   Pulmonary:     Effort: Pulmonary effort is normal.     Breath sounds: Normal breath sounds. No wheezing or rales.           Assessment & Recommendations:   59 year old Caucasian male with CAD, culprit (RCA) and nonculprit (LCx) PCI after STEMI 07/2020, HFrEF, moderate MR, h/o cardiac arrest 2/2 Torsades post PPCI during index hospitalization.  CAD: STEMI 07/2020.  Primary PCI to culprit vessel RCA. Resolute Onyx drug-eluting stent 2.5X26 mm DES Recurrent chest pain and shortness of breath requiring nonculprit PCI to OM2 during index hospitalization.  Successful  percutaneous coronary intervention OM2-Prox LCx PTCA and overlapping stents placement  2.5 X 38 mm and 2.5 X 18 mm Resolute Onyx drug-eluting stents 100%--->0% stenosis. TIMI flow 0-->III Small caliber distal vessel with moderate diffuse disease Residual moderate distal left main, prox RCA, and mild mid LAD disease.  No exertional angina to indicate an revascularized severe CAD to be the cause of his persistent low EF. Recommend DAPT with aspirin and Brilinta at least till 07/2021. Continue metoprolol succinate 25 mg, Crestor 40 mg. LDL significantly improved.  HFrEF: EF 15-20% in spite of optimum revascularization.  I suspect he may have had reduced EF even  prior to his STEMI, owing to his chronically occluded left circumflex, which is now revascularized. Currently on losartan 25 mg, metoprolol succinate 25 mg, spironolactone 25 mg, Crestor 40 mg daily. Stop losartan. Start Entresto 24-26 mg bid. Check CMP, pro BNP in 1 week.  Given that his EF has remained low 6 weeks post MI, and his h/o cardiac arrest due to Torsades soon after primary revascularization, I will refer him to EP for evaluation of ICD placement. Continue LifeVest for now.   Claudication, paresthesias: Rutherford class III claudication. I suspect aortoiliac disease, given his weak femoral pulses. I will obtain lower extremity duplex. Continue aspirin, statin. His HFrEF precludes use of Pletal. Encouraged walking, smoking cessation. If his symptoms persist in spite of the above, or if critical limb ischemia develops, may need revascularization.  He has had lower extremity numbness for quite some time, recently worsened. PAD could certainly be contributing. In addition, I have referred him to neurology to evaluate for any there reversible causes, and management.   H/o cardiac arrest: Torsades post PPCI during index hospitalization with no recurrence EF remains 10-15% 6 weeks post MI Referred to EP, as  above.  Hyperlipidemia: LDL down from 123 to 49. Continue Crestor 40 mg.  Snoring, sinus pauses: Noted to have up to 4-second sinus pauses during sleep.  Suspect he may have obstructive sleep apnea.    Tobacco dependence: Tobacco cessation counseling:  - Currently smoking 2-6 cigarettes/day   - Patient was informed of the dangers of tobacco abuse including stroke, cancer, and MI, as well as benefits of tobacco cessation. - Patient is willing to quit at this time. - Approximately 5 mins were spent counseling patient cessation techniques. We discussed various methods to help quit smoking, including deciding on a date to quit, joining a support group, pharmacological agents. Patient would like to use nicotine gum. - I will reassess his progress at the next follow-up visit  Complex decision making of multiple medical comorbidities with acute exacerbation.  Nigel Mormon, MD Pager: [phone] Office: [phone]

## 2020-09-20 ENCOUNTER — Encounter: Payer: Self-pay | Admitting: Cardiology

## 2020-09-20 ENCOUNTER — Telehealth: Payer: Self-pay

## 2020-09-20 ENCOUNTER — Other Ambulatory Visit: Payer: Self-pay

## 2020-09-20 ENCOUNTER — Ambulatory Visit: Payer: BC Managed Care – PPO | Admitting: Cardiology

## 2020-09-20 VITALS — BP 126/80 | HR 82 | Resp 16 | Ht 72.0 in | Wt 166.6 lb

## 2020-09-20 DIAGNOSIS — R202 Paresthesia of skin: Secondary | ICD-10-CM

## 2020-09-20 DIAGNOSIS — I739 Peripheral vascular disease, unspecified: Secondary | ICD-10-CM | POA: Insufficient documentation

## 2020-09-20 DIAGNOSIS — F17200 Nicotine dependence, unspecified, uncomplicated: Secondary | ICD-10-CM

## 2020-09-20 DIAGNOSIS — I252 Old myocardial infarction: Secondary | ICD-10-CM

## 2020-09-20 DIAGNOSIS — Z8674 Personal history of sudden cardiac arrest: Secondary | ICD-10-CM | POA: Diagnosis not present

## 2020-09-20 DIAGNOSIS — I5020 Unspecified systolic (congestive) heart failure: Secondary | ICD-10-CM

## 2020-09-20 DIAGNOSIS — I2510 Atherosclerotic heart disease of native coronary artery without angina pectoris: Secondary | ICD-10-CM

## 2020-09-20 DIAGNOSIS — F17210 Nicotine dependence, cigarettes, uncomplicated: Secondary | ICD-10-CM | POA: Diagnosis not present

## 2020-09-20 MED ORDER — SPIRONOLACTONE 50 MG PO TABS: 25.0000 mg | ORAL_TABLET | Freq: Every day | ORAL | 0 refills | Status: DC

## 2020-09-20 MED ORDER — NICOTINE POLACRILEX 2 MG MT GUM: 2.0000 mg | CHEWING_GUM | OROMUCOSAL | 2 refills | Status: DC | PRN

## 2020-09-20 MED ORDER — ENTRESTO 24-26 MG PO TABS: 1.0000 | ORAL_TABLET | Freq: Two times a day (BID) | ORAL | 1 refills | Status: DC

## 2020-09-20 NOTE — Telephone Encounter (Signed)
Pt is aware.  

## 2020-09-21 ENCOUNTER — Ambulatory Visit: Payer: BC Managed Care – PPO

## 2020-09-21 ENCOUNTER — Other Ambulatory Visit: Payer: Self-pay

## 2020-09-21 DIAGNOSIS — I739 Peripheral vascular disease, unspecified: Secondary | ICD-10-CM

## 2020-09-21 DIAGNOSIS — I700 Atherosclerosis of aorta: Secondary | ICD-10-CM

## 2020-09-25 DIAGNOSIS — I5020 Unspecified systolic (congestive) heart failure: Secondary | ICD-10-CM | POA: Diagnosis not present

## 2020-09-26 LAB — COMPREHENSIVE METABOLIC PANEL
ALT: 25 IU/L (ref 0–44)
AST: 22 IU/L (ref 0–40)
Albumin/Globulin Ratio: 1.6 (ref 1.2–2.2)
Albumin: 4.3 g/dL (ref 3.8–4.8)
Alkaline Phosphatase: 82 IU/L (ref 44–121)
BUN/Creatinine Ratio: 13 (ref 10–24)
BUN: 15 mg/dL (ref 8–27)
Bilirubin Total: 0.8 mg/dL (ref 0.0–1.2)
CO2: 18 mmol/L — ABNORMAL LOW (ref 20–29)
Calcium: 9.4 mg/dL (ref 8.6–10.2)
Chloride: 108 mmol/L — ABNORMAL HIGH (ref 96–106)
Creatinine, Ser: 1.16 mg/dL (ref 0.76–1.27)
GFR calc Af Amer: 78 mL/min/{1.73_m2} (ref >59)
GFR calc non Af Amer: 68 mL/min/{1.73_m2} (ref >59)
Globulin, Total: 2.7 g/dL (ref 1.5–4.5)
Glucose: 102 mg/dL — ABNORMAL HIGH (ref 65–99)
Potassium: 4.5 mmol/L (ref 3.5–5.2)
Sodium: 140 mmol/L (ref 134–144)
Total Protein: 7 g/dL (ref 6.0–8.5)

## 2020-09-26 LAB — PRO B NATRIURETIC PEPTIDE: NT-Pro BNP: 2404 pg/mL — ABNORMAL HIGH (ref 0–210)

## 2020-09-28 ENCOUNTER — Other Ambulatory Visit: Payer: Self-pay

## 2020-09-28 ENCOUNTER — Ambulatory Visit (INDEPENDENT_AMBULATORY_CARE_PROVIDER_SITE_OTHER): Payer: BC Managed Care – PPO | Admitting: Internal Medicine

## 2020-09-28 ENCOUNTER — Encounter: Payer: Self-pay | Admitting: Internal Medicine

## 2020-09-28 VITALS — BP 94/72 | HR 100 | Ht 72.0 in | Wt 163.4 lb

## 2020-09-28 DIAGNOSIS — Z8674 Personal history of sudden cardiac arrest: Secondary | ICD-10-CM

## 2020-09-28 DIAGNOSIS — I5020 Unspecified systolic (congestive) heart failure: Secondary | ICD-10-CM | POA: Diagnosis not present

## 2020-09-28 NOTE — Patient Instructions (Addendum)
Medication Instructions:  Your physician recommends that you continue on your current medications as directed. Please refer to the Current Medication list given to you today.  *If you need a refill on your cardiac medications before your next appointment, please call your pharmacy*   Lab Work: None ordered.  If you have labs (blood work) drawn today and your tests are completely normal, you will receive your results only by: Marland Kitchen MyChart Message (if you have MyChart) OR . A paper copy in the mail If you have any lab test that is abnormal or we need to change your treatment, we will call you to review the results.   Testing/Procedures: None ordered.    Follow-Up: At [facility], you and your health needs are our priority.  As part of our continuing mission to provide you with exceptional heart care, we have created designated Provider Care Teams.  These Care Teams include your primary Cardiologist (physician) and Advanced Practice Providers (APPs -  Physician Assistants and Nurse Practitioners) who all work together to provide you with the care you need, when you need it.  We recommend signing up for the patient portal called "MyChart".  Sign up information is provided on this After Visit Summary.  MyChart is used to connect with patients for Virtual Visits (Telemedicine).  Patients are able to view lab/test results, encounter notes, upcoming appointments, etc.  Non-urgent messages can be sent to your provider as well.   To learn more about what you can do with MyChart, go to [email or web address].    Your next appointment:  To be scheduled.

## 2020-09-28 NOTE — Progress Notes (Signed)
ELECTROPHYSIOLOGY CONSULT NOTE  Patient ID: Mike [name], MRN: [id number], DOB/AGE: Oct 26, 1961 59 y.o. Admit date: (Not on file) Date of Consult: 09/28/2020  Primary Physician: Patient, No Pcp Per Primary Cardiologist:MPat     Mike [name] is a 59 y.o. male who is being seen today for the evaluation of ICD at the request of MPat.    HPI Mike [name] is a 59 y.o. male referred for consideration of an ICD.  History of coronary artery disease presented with inferior wall MI 8/21.  Underwent successful PCI of the RCA with a subtotally chronically occluded OM 2. Postoperative course complicated by VF requiring shock and then on hospital day 2 developed severe shortness of breath.  Repeat catheterization prompted intervention on OM 2.  LVEDP was 42. Discharged with LifeVest in place  Currently on Entresto, metoprolol and spironolactone  Modest dyspnea on exertion.  No edema.  No lightheadedness.  No palpitations.  Sleep is disturbed because of chronic neuropathic leg pain  DATE TEST EF   8/21 Echo   30-35 %   8/21 LHC    % LM 30; LAD- normal ; RCAm-T>>stent  OM2 L-L collaterals  9/21 Echo  15%    Date Cr K Hgb  9/21 1.16 4.5 14            Past Medical History:  Diagnosis Date  . CAD (coronary artery disease)   . Heart attack ([place])   . Hyperlipidemia   . Hypertension       Surgical History:  Past Surgical History:  Procedure Laterality Date  . CORONARY STENT INTERVENTION N/A 08/03/2020   Procedure: CORONARY STENT INTERVENTION;  Surgeon: Nigel Mormon, MD;  Location: [place] [facility];  Service: Cardiovascular;  Laterality: N/A;  . CORONARY/GRAFT ACUTE MI REVASCULARIZATION N/A 08/01/2020   Procedure: Coronary/Graft Acute MI Revascularization;  Surgeon: Nigel Mormon, MD;  Location: [place] [facility];  Service: Cardiovascular;  Laterality: N/A;  . LEFT HEART CATH AND CORONARY ANGIOGRAPHY N/A 08/01/2020   Procedure: LEFT HEART CATH AND  CORONARY ANGIOGRAPHY;  Surgeon: Nigel Mormon, MD;  Location: [place] [facility];  Service: Cardiovascular;  Laterality: N/A;  . LEFT HEART CATH AND CORONARY ANGIOGRAPHY N/A 08/03/2020   Procedure: LEFT HEART CATH AND CORONARY ANGIOGRAPHY;  Surgeon: Nigel Mormon, MD;  Location: [place] [facility];  Service: Cardiovascular;  Laterality: N/A;     Home Meds: Current Meds  Medication Sig  . aspirin 81 MG EC tablet Take 1 tablet (81 mg total) by mouth daily. Swallow whole.  . metoprolol succinate (TOPROL-XL) 25 MG 24 hr tablet Take 1 tablet (25 mg total) by mouth at bedtime. Take with or immediately following a meal.  . nicotine (NICODERM CQ - DOSED IN MG/24 HR) 7 mg/24hr patch Place 1 patch (7 mg total) onto the skin daily.  . nicotine polacrilex (NICORETTE) 2 MG gum Take 1 each (2 mg total) by mouth as needed for smoking cessation.  . nitroGLYCERIN (NITROSTAT) 0.4 MG SL tablet Place 1 tablet (0.4 mg total) under the tongue every 5 (five) minutes as needed for chest pain.  . rosuvastatin (CRESTOR) 40 MG tablet Take 1 tablet (40 mg total) by mouth daily.  . sacubitril-valsartan (ENTRESTO) 24-26 MG Take 1 tablet by mouth 2 (two) times daily.  Marland Kitchen spironolactone (ALDACTONE) 50 MG tablet Take 0.5 tablets (25 mg total) by mouth daily.  . ticagrelor (BRILINTA) 90 MG TABS tablet Take 1 tablet (90 mg total)  by mouth 2 (two) times daily.    Allergies: No Known Allergies  Social History   Socioeconomic History  . Marital status: Single    Spouse name: Not on file  . Number of children: 1  . Years of education: Not on file  . Highest education level: Not on file  Occupational History  . Not on file  Tobacco Use  . Smoking status: Current Some Day Smoker    Packs/day: 0.50    Years: 40.00    Pack years: 20.00    Types: Cigarettes  . Smokeless tobacco: Never Used  Vaping Use  . Vaping Use: Never used  Substance and Sexual Activity  . Alcohol use: Yes    Alcohol/week: 10.0 standard  drinks    Types: 10 Cans of beer per week    Comment: occ  . Drug use: Never  . Sexual activity: Not on file  Other Topics Concern  . Not on file  Social History Narrative  . Not on file   Social Determinants of Health   Financial Resource Strain:   . Difficulty of Paying Living Expenses: Not on file  Food Insecurity:   . Worried About Charity fundraiser in the Last Year: Not on file  . Ran Out of Food in the Last Year: Not on file  Transportation Needs:   . Lack of Transportation (Medical): Not on file  . Lack of Transportation (Non-Medical): Not on file  Physical Activity:   . Days of Exercise per Week: Not on file  . Minutes of Exercise per Session: Not on file  Stress:   . Feeling of Stress : Not on file  Social Connections:   . Frequency of Communication with Friends and Family: Not on file  . Frequency of Social Gatherings with Friends and Family: Not on file  . Attends Religious Services: Not on file  . Active Member of Clubs or Organizations: Not on file  . Attends Archivist Meetings: Not on file  . Marital Status: Not on file  Intimate Partner Violence:   . Fear of Current or Ex-Partner: Not on file  . Emotionally Abused: Not on file  . Physically Abused: Not on file  . Sexually Abused: Not on file     Family History  Problem Relation Age of Onset  . Heart disease Mother   . Heart disease Father   . Cancer Brother      ROS:  Please see the history of present illness.     All other systems reviewed and negative.    Physical Exam: Blood pressure 94/72, pulse 100, height 6' (1.829 m), weight 163 lb 6.4 oz (74.1 kg), SpO2 98 %. General: Well developed, well nourished male in no acute distress. Head: Normocephalic, atraumatic, sclera non-icteric, no xanthomas, nares are without discharge. EENT: normal  Lymph Nodes:  none Neck: Negative for carotid bruits. JVD 7 cm Back:without scoliosis kyphosis Lungs: Clear bilaterally to auscultation without  wheezes, rales, or rhonchi. Breathing is unlabored. Heart: RRR with S1 S2. No murmur .  PMI not displaced no rubs, or gallops appreciated. Abdomen: Soft, non-tender, non-distended with normoactive bowel sounds. No hepatomegaly. No rebound/guarding. No obvious abdominal masses. Msk:  Strength and tone appear normal for age. Extremities: No clubbing or cyanosis. No edema.  Distal pedal pulses are 2+ and equal bilaterally. Skin: Warm and Dry Neuro: Alert and oriented X 3. CN III-XII intact Grossly normal sensory and motor function . Psych:  Responds to questions appropriately with  a normal affect.        EKG: Sinus at 100 Interval 14/09/38 Q waves 2, 3, F with ST segment elevation unchanged from 04 August 2020   Assessment and Plan:  Ischemic cardiomyopathy  Sinus tachycardia  Hypotension  Post PCI VF  Cigarette abuse  Congestive heart failure-chronic-systolic class III  LifeVest in place   The patient has persistent LV dysfunction now at 2 months post MI and revascularization.  Per guidelines we will need to wait another month.  Will review with Dr. Wonda Amis and echocardiogram obtained at that time prior to making a decision.   We have discussed the relative benefits and merits of transvenous versus subcutaneous ICD implantation.  The advantages of the former including battery longevity, history derived from use in randomized controlled trials and perhaps a somewhat lower rate of inappropriate ICD discharges. Advantages of the latter  include the fact that it is extravascular,  Resulting different implications of device infection and it being non-transvalvular.    Given this extensive scarring and indeed possible aneurysm as suggested by his persistent ST elevation, ventricular tachycardia-monomorphic is a likely arrhythmia.  Hence, would avoid an S ICD so as to be able to offer antitachycardia pacing for VT  His tachycardia affords another potential therapeutic target.  We will review  with Dr. Wonda Amis the role of ivabradine     Virl Axe

## 2020-09-29 DIAGNOSIS — I70223 Atherosclerosis of native arteries of extremities with rest pain, bilateral legs: Secondary | ICD-10-CM | POA: Diagnosis not present

## 2020-10-04 ENCOUNTER — Other Ambulatory Visit: Payer: Self-pay

## 2020-10-04 DIAGNOSIS — I2510 Atherosclerotic heart disease of native coronary artery without angina pectoris: Secondary | ICD-10-CM

## 2020-10-04 DIAGNOSIS — I5020 Unspecified systolic (congestive) heart failure: Secondary | ICD-10-CM

## 2020-10-04 DIAGNOSIS — I252 Old myocardial infarction: Secondary | ICD-10-CM

## 2020-10-04 MED ORDER — METOPROLOL SUCCINATE ER 25 MG PO TB24: 25.0000 mg | ORAL_TABLET | Freq: Every day | ORAL | 3 refills | Status: DC

## 2020-10-04 MED ORDER — ROSUVASTATIN CALCIUM 40 MG PO TABS
40.0000 mg | ORAL_TABLET | Freq: Every day | ORAL | 3 refills | Status: DC
Start: 2020-10-04 — End: 2020-11-15

## 2020-10-05 ENCOUNTER — Ambulatory Visit: Payer: BC Managed Care – PPO | Admitting: Cardiology

## 2020-10-05 ENCOUNTER — Encounter: Payer: Self-pay | Admitting: Cardiology

## 2020-10-05 VITALS — BP 125/85 | HR 84 | Resp 16 | Ht 72.0 in | Wt 162.6 lb

## 2020-10-05 DIAGNOSIS — I2510 Atherosclerotic heart disease of native coronary artery without angina pectoris: Secondary | ICD-10-CM

## 2020-10-05 DIAGNOSIS — I70228 Atherosclerosis of native arteries of extremities with rest pain, other extremity: Secondary | ICD-10-CM | POA: Diagnosis not present

## 2020-10-05 DIAGNOSIS — I739 Peripheral vascular disease, unspecified: Secondary | ICD-10-CM

## 2020-10-05 DIAGNOSIS — I70229 Atherosclerosis of native arteries of extremities with rest pain, unspecified extremity: Secondary | ICD-10-CM

## 2020-10-05 DIAGNOSIS — I5020 Unspecified systolic (congestive) heart failure: Secondary | ICD-10-CM

## 2020-10-05 MED ORDER — FUROSEMIDE 20 MG PO TABS: 20.0000 mg | ORAL_TABLET | Freq: Every day | ORAL | 2 refills | Status: DC

## 2020-10-05 MED ORDER — ENTRESTO 49-51 MG PO TABS: 1.0000 | ORAL_TABLET | Freq: Two times a day (BID) | ORAL | 2 refills | Status: DC

## 2020-10-05 MED ORDER — OXYCODONE-ACETAMINOPHEN 5-325 MG PO TABS: 1.0000 | ORAL_TABLET | ORAL | 0 refills | Status: DC | PRN

## 2020-10-05 MED ORDER — IVABRADINE HCL 5 MG PO TABS: 5.0000 mg | ORAL_TABLET | Freq: Two times a day (BID) | ORAL | 2 refills | Status: DC

## 2020-10-05 NOTE — Progress Notes (Signed)
Follow up visit  Subjective:   Mike [name], male    DOB: [birth year], 59 y.o.   MRN: [id number]     HPI   Chief Complaint  Patient presents with  . HFrEF  . Follow-up    24 weeks   59 year old Caucasian male with CAD, culprit (RCA) and nonculprit (LCx) PCI after STEMI 07/2020, HFrEF, moderate MR, h/o cardiac arrest 2/2 Torsades post PPCI during index hospitalization, PAD with resting left leg pain  Patient continues to have exertional dyspnea, although this has improved since starting Entresto.  His biggest complaint today is left leg pain.  In addition to severe claudication, he will now also has resting pain.  Patient was evaluated by Dr. Caryl Comes.  Pending further up titration of heart failure management, he could be considered a candidate for ICD if EF does not recover 3 months post MI.    Current Outpatient Medications on File Prior to Visit  Medication Sig Dispense Refill  . aspirin 81 MG EC tablet Take 1 tablet (81 mg total) by mouth daily. Swallow whole. 90 tablet 3  . metoprolol succinate (TOPROL-XL) 25 MG 24 hr tablet Take 1 tablet (25 mg total) by mouth at bedtime. Take with or immediately following a meal. 90 tablet 3  . nicotine (NICODERM CQ - DOSED IN MG/24 HR) 7 mg/24hr patch Place 1 patch (7 mg total) onto the skin daily. 30 patch 1  . nitroGLYCERIN (NITROSTAT) 0.4 MG SL tablet Place 1 tablet (0.4 mg total) under the tongue every 5 (five) minutes as needed for chest pain. 30 tablet 1  . rosuvastatin (CRESTOR) 40 MG tablet Take 1 tablet (40 mg total) by mouth daily. 90 tablet 3  . sacubitril-valsartan (ENTRESTO) 24-26 MG Take 1 tablet by mouth 2 (two) times daily. 60 tablet 1  . spironolactone (ALDACTONE) 50 MG tablet Take 0.5 tablets (25 mg total) by mouth daily. 1 tablet 0  . ticagrelor (BRILINTA) 90 MG TABS tablet Take 1 tablet (90 mg total) by mouth 2 (two) times daily. 180 tablet 3  . nicotine polacrilex (NICORETTE) 2 MG gum Take 1 each (2 mg total) by mouth as  needed for smoking cessation. (Patient not taking: Reported on 10/05/2020) 30 tablet 2   No current facility-administered medications on file prior to visit.    Cardiovascular & other pertient studies:  Abdominal Aortic Duplex 09/21/2020:  Moderate plaque noted in the proximal, mid and distal aorta. There is an  ulcerated plaque noted in the distal abdominal aorta. No AAA. Normal iliac  artery velocity.  Lower Extremity Arterial Duplex 09/21/2020:  The right SFA is occluded in the proximal segment with reconstitution at  the level of the popliteal artery with diffuse monophasic waveform below  the knee. Right profunda femoral artery has >50% stenosis. There is  moderate mixed plaque noted throughout the right lower extremity.  Monophasic waveform throughout the left lower extremity, indicates  significant proximal disease (iliac artery).   Left SFA is occluded in the proximal segment and reconstitutes just above popliteal artery and diffuse dampened monophasic waveform throughout the lower extremity below the  knee. There is moderate mixed plaque throughout the left lower extremity.   This exam reveals severely decreased perfusion of the right lower  extremity, noted at the dorsalis pedis artery level (ABI 0.42) and  critically decreased perfusion of the left lower extremity, noted at the  dorsalis pedis and post tibial artery level (ABI 0.03).   Echocardiogram 09/11/2020:  Left ventricle cavity is moderately  dilated. Moderate concentric  hypertrophy of the left ventricle. Moderate global and severe  inferolateral hypokinesis. LVEF 15-20%. Grade 1 diastolic dysfunction.  Normal left atrial pressure. Calculated EF 15%.  Mild to moderate mitral regurgitation.  Inadequate TR jet to estimate pulmonary artery systolic pressure. Normal  right atrial pressure.  Compared to previous study in 03/2020, LVEF is reduced from 30-35%. Mitral  regurgitation is marginally improved.   EKG  08/14/2020: Sinus rhythm 81 bpm Old inferior and anterior infarct  Coronary intervention 08/03/2020: LM: Distal 40% stenosis LAD: Mid 30% disease LCx: Subtotally occluded OM2, prox LCx 70% stenosis Successful percutaneous coronary intervention OM2-Prox LCx PTCA and overlapping stents placement  2.5 X 38 mm and 2.5 X 18 mm Resolute Onyx drug-eluting stents 100%--->0% stenosis. TIMI flow 0-->III Small caliber distal vessel with moderate diffuse disease RCA: Prox 30% stenosis. Patent mid RCA sttent 2.5 X 26 mm Resolute Onyx drug-eluting stent  LVEDP 42 mmHg  Echocardiogram 08/02/2020: 1. Moderate global and severe inferolateral hypokinesis. Left ventricular  ejection fraction, by estimation, is 30 to 35%. The left ventricle has  normal function. The left ventricle demonstrates regional wall motion  abnormalities (see scoring  diagram/findings for description). There is mild left ventricular  hypertrophy. Left ventricular diastolic parameters are consistent with  Grade II diastolic dysfunction (pseudonormalization).  2. Right ventricular systolic function is low normal. The right  ventricular size is normal.  3. Left atrial size was mildly dilated.  4. The mitral valve is grossly normal. Moderate mitral valve  regurgitation.  5. The aortic valve is tricuspid. Aortic valve regurgitation is not  visualized. Mild aortic valve sclerosis is present, with no evidence of  aortic valve stenosis.  6. The inferior vena cava is dilated in size with >50% respiratory  variability, suggesting right atrial pressure of 8 mmHg.   Coronary intervention 08/01/2020: LM: Distal 30% stenosis LAD: Mid 30% disease LCx: Subtotally occluded OM2, bridging left-to-left and right-to-left collaterals RCA: Prox 30% stenosis. Mid 100% occlusion Successful percutaneous coronary intervention mid RCA PTCA and stent placement 2.5 X 26 mm Resolute  Onyx drug-eluting stent 100%--->0% stenosis. TIMI flow 0-->III   Recent labs: 09/25/2020: Glucose 102, BUN/Cr 18/1.16. EGFR 68. Na/K 140/4.5.  NT pro BNP 2404  09/11/2020: Glucose 114, BUN/Cr 19/1.42. EGFR 53. Na/K 136/4.7.  Chol 95, TG 76, HDL 30, LDL 49  08/04/2020: Glucose 101, BUN/Cr 19/1.08. EGFR >60. Na/K 139/4.0. Rest of the CMP normal H/H 14/42. MCV 89. Platelets 184. WBC count 14k HbA1C 5.8% Chol 178, TG 75, HDL 40, LDL 123    Review of Systems  Cardiovascular: Positive for claudication and dyspnea on exertion. Negative for chest pain, leg swelling, palpitations and syncope.  Respiratory: Positive for shortness of breath.          Vitals:   10/05/20 0853  BP: 125/85  Pulse: 84  Resp: 16  SpO2: 98%     Body mass index is 22.05 kg/m. Filed Weights   10/05/20 0853  Weight: 162 lb 9.6 oz (73.8 kg)     Objective:   Physical Exam Vitals and nursing note reviewed.  Constitutional:      General: He is not in acute distress. Neck:     Vascular: No JVD.  Cardiovascular:     Rate and Rhythm: Normal rate and regular rhythm.     Pulses:          Femoral pulses are 1+ on the right side and 1+ on the left side.      Dorsalis  pedis pulses are 1+ on the right side and 1+ on the left side.       Posterior tibial pulses are 0 on the right side and 0 on the left side.     Heart sounds: Normal heart sounds. No murmur heard.      Comments: 2 mm area of dark discoloration on 2nd toe left foot. Pulmonary:     Effort: Pulmonary effort is normal.     Breath sounds: Normal breath sounds. No wheezing or rales.           Assessment & Recommendations:   59 year old Caucasian male with CAD, culprit (RCA) and nonculprit (LCx) PCI after STEMI 07/2020, HFrEF, moderate MR, h/o cardiac arrest 2/2 Torsades post PPCI during index hospitalization, PAD with resting left leg pain  CAD: STEMI 07/2020.  Primary PCI to culprit vessel RCA. Resolute Onyx drug-eluting stent 2.5X26  mm DES Recurrent chest pain and shortness of breath requiring nonculprit PCI to OM2 during index hospitalization.  Successful percutaneous coronary intervention OM2-Prox LCx PTCA and overlapping stents placement  2.5 X 38 mm and 2.5 X 18 mm Resolute Onyx drug-eluting stents 100%--->0% stenosis. TIMI flow 0-->III Small caliber distal vessel with moderate diffuse disease Residual moderate distal left main, prox RCA, and mild mid LAD disease.  No exertional angina to indicate an revascularized severe CAD to be the cause of his persistent low EF. Recommend DAPT with aspirin and Brilinta at least till 07/2021. Continue metoprolol succinate 25 mg, Crestor 40 mg. LDL significantly improved.  HFrEF: EF 15-20% in spite of optimum revascularization.  I suspect he may have had reduced EF even prior to his STEMI, owing to his chronically occluded left circumflex, which is now revascularized. Currently on Entresto 24-26 mg bid, metoprolol succinate 25 mg, spironolactone 25 mg, Crestor 40 mg daily. Increase Entresto to 49-50 mg twice daily.  Added Corlanor 5 mg twice daily. Repeat echocardiogram in 12/2020.   PAD: In addition to underlying Rutherford class III claudication, he now also has resting leg pain and a small area of necrosis in 2nd toe left foot. PAD seen in both lower extremities, left worse than right. He has critical limb ischemia in left foot. Recommend peripheral angiography and possible intervention. Risks, benefits, alternate options discussed with the patient. Continue aspirin and Brilinta for dual antiplatelet therapy post MI.  Continue statin. Given his severe pain, I prescribed him Percocet.  Side effects discussed with the patient.  H/o cardiac arrest: Medication Torsades post PPCI during index hospitalization with no recurrence EF remains 10-15% 6 weeks post MI Referred to EP, as above.  Hyperlipidemia: LDL down from 123 to 49. Continue Crestor 40  mg.  Snoring, sinus pauses: Noted to have up to 4-second sinus pauses during sleep.  Suspect he may have obstructive sleep apnea.    Tobacco dependence: Re-emphasized tobacco cessation.   Nigel Mormon, MD Pager: [phone] Office: [phone]

## 2020-10-07 DIAGNOSIS — Z9861 Coronary angioplasty status: Secondary | ICD-10-CM | POA: Diagnosis not present

## 2020-10-07 DIAGNOSIS — I2111 ST elevation (STEMI) myocardial infarction involving right coronary artery: Secondary | ICD-10-CM | POA: Diagnosis not present

## 2020-10-10 ENCOUNTER — Other Ambulatory Visit: Payer: Self-pay

## 2020-10-12 ENCOUNTER — Other Ambulatory Visit (HOSPITAL_COMMUNITY)
Admission: RE | Admit: 2020-10-12 | Discharge: 2020-10-12 | Disposition: A | Discharge status: Home / Self Care | Payer: BC Managed Care – PPO | Source: Ambulatory Visit | Attending: Cardiology | Admitting: Cardiology

## 2020-10-12 DIAGNOSIS — Z20822 Contact with and (suspected) exposure to covid-19: Secondary | ICD-10-CM | POA: Diagnosis not present

## 2020-10-12 DIAGNOSIS — Z01812 Encounter for preprocedural laboratory examination: Secondary | ICD-10-CM | POA: Insufficient documentation

## 2020-10-12 DIAGNOSIS — I5020 Unspecified systolic (congestive) heart failure: Secondary | ICD-10-CM | POA: Diagnosis not present

## 2020-10-12 LAB — SARS CORONAVIRUS 2 (TAT 6-24 HRS): SARS Coronavirus 2: NEGATIVE

## 2020-10-13 LAB — PRO B NATRIURETIC PEPTIDE: NT-Pro BNP: 1642 pg/mL — ABNORMAL HIGH (ref 0–210)

## 2020-10-16 ENCOUNTER — Inpatient Hospital Stay (HOSPITAL_COMMUNITY)
Admission: RE | Admit: 2020-10-16 | Discharge: 2020-10-18 | DRG: 674 | Disposition: A | Discharge status: Home / Self Care | Payer: BC Managed Care – PPO | Attending: Cardiology | Admitting: Cardiology

## 2020-10-16 ENCOUNTER — Other Ambulatory Visit: Payer: Self-pay

## 2020-10-16 ENCOUNTER — Inpatient Hospital Stay (HOSPITAL_COMMUNITY)
Admission: RE | Disposition: A | Discharge status: Home / Self Care | Payer: Self-pay | Source: Home / Self Care | Attending: Cardiology

## 2020-10-16 DIAGNOSIS — Z7982 Long term (current) use of aspirin: Secondary | ICD-10-CM

## 2020-10-16 DIAGNOSIS — I70229 Atherosclerosis of native arteries of extremities with rest pain, unspecified extremity: Secondary | ICD-10-CM

## 2020-10-16 DIAGNOSIS — I2510 Atherosclerotic heart disease of native coronary artery without angina pectoris: Secondary | ICD-10-CM | POA: Diagnosis not present

## 2020-10-16 DIAGNOSIS — F17210 Nicotine dependence, cigarettes, uncomplicated: Secondary | ICD-10-CM | POA: Diagnosis present

## 2020-10-16 DIAGNOSIS — I252 Old myocardial infarction: Secondary | ICD-10-CM | POA: Diagnosis not present

## 2020-10-16 DIAGNOSIS — I959 Hypotension, unspecified: Secondary | ICD-10-CM | POA: Diagnosis not present

## 2020-10-16 DIAGNOSIS — Z8249 Family history of ischemic heart disease and other diseases of the circulatory system: Secondary | ICD-10-CM | POA: Diagnosis not present

## 2020-10-16 DIAGNOSIS — Z01810 Encounter for preprocedural cardiovascular examination: Secondary | ICD-10-CM | POA: Diagnosis not present

## 2020-10-16 DIAGNOSIS — Z955 Presence of coronary angioplasty implant and graft: Secondary | ICD-10-CM | POA: Diagnosis not present

## 2020-10-16 DIAGNOSIS — I5022 Chronic systolic (congestive) heart failure: Secondary | ICD-10-CM | POA: Diagnosis present

## 2020-10-16 DIAGNOSIS — Z79899 Other long term (current) drug therapy: Secondary | ICD-10-CM | POA: Diagnosis not present

## 2020-10-16 DIAGNOSIS — E785 Hyperlipidemia, unspecified: Secondary | ICD-10-CM | POA: Diagnosis present

## 2020-10-16 DIAGNOSIS — Z7902 Long term (current) use of antithrombotics/antiplatelets: Secondary | ICD-10-CM | POA: Diagnosis not present

## 2020-10-16 DIAGNOSIS — Z8674 Personal history of sudden cardiac arrest: Secondary | ICD-10-CM | POA: Diagnosis not present

## 2020-10-16 DIAGNOSIS — I701 Atherosclerosis of renal artery: Secondary | ICD-10-CM | POA: Diagnosis present

## 2020-10-16 DIAGNOSIS — E871 Hypo-osmolality and hyponatremia: Secondary | ICD-10-CM | POA: Diagnosis present

## 2020-10-16 DIAGNOSIS — I110 Hypertensive heart disease with heart failure: Secondary | ICD-10-CM | POA: Diagnosis present

## 2020-10-16 DIAGNOSIS — I70228 Atherosclerosis of native arteries of extremities with rest pain, other extremity: Secondary | ICD-10-CM

## 2020-10-16 DIAGNOSIS — I5020 Unspecified systolic (congestive) heart failure: Secondary | ICD-10-CM | POA: Diagnosis not present

## 2020-10-16 DIAGNOSIS — N179 Acute kidney failure, unspecified: Secondary | ICD-10-CM

## 2020-10-16 DIAGNOSIS — N170 Acute kidney failure with tubular necrosis: Secondary | ICD-10-CM | POA: Diagnosis not present

## 2020-10-16 DIAGNOSIS — R34 Anuria and oliguria: Secondary | ICD-10-CM | POA: Diagnosis not present

## 2020-10-16 DIAGNOSIS — I70245 Atherosclerosis of native arteries of left leg with ulceration of other part of foot: Secondary | ICD-10-CM | POA: Diagnosis not present

## 2020-10-16 DIAGNOSIS — E872 Acidosis: Secondary | ICD-10-CM | POA: Diagnosis not present

## 2020-10-16 DIAGNOSIS — I70223 Atherosclerosis of native arteries of extremities with rest pain, bilateral legs: Secondary | ICD-10-CM | POA: Diagnosis not present

## 2020-10-16 DIAGNOSIS — I739 Peripheral vascular disease, unspecified: Secondary | ICD-10-CM

## 2020-10-16 DIAGNOSIS — T508X5A Adverse effect of diagnostic agents, initial encounter: Secondary | ICD-10-CM | POA: Diagnosis present

## 2020-10-16 DIAGNOSIS — I70211 Atherosclerosis of native arteries of extremities with intermittent claudication, right leg: Secondary | ICD-10-CM | POA: Diagnosis not present

## 2020-10-16 HISTORY — PX: PERIPHERAL INTRAVASCULAR LITHOTRIPSY: CATH118324

## 2020-10-16 HISTORY — PX: PERIPHERAL VASCULAR INTERVENTION: CATH118257

## 2020-10-16 HISTORY — PX: ABDOMINAL AORTOGRAM W/LOWER EXTREMITY: CATH118223

## 2020-10-16 LAB — GLUCOSE, CAPILLARY: Glucose-Capillary: 139 mg/dL — ABNORMAL HIGH (ref 70–99)

## 2020-10-16 LAB — CBC
HCT: 45.4 % (ref 39.0–52.0)
Hemoglobin: 14.8 g/dL (ref 13.0–17.0)
MCH: 28.2 pg (ref 26.0–34.0)
MCHC: 32.6 g/dL (ref 30.0–36.0)
MCV: 86.6 fL (ref 80.0–100.0)
Platelets: 249 10*3/uL (ref 150–400)
RBC: 5.24 MIL/uL (ref 4.22–5.81)
RDW: 15.9 % — ABNORMAL HIGH (ref 11.5–15.5)
WBC: 15.5 10*3/uL — ABNORMAL HIGH (ref 4.0–10.5)
nRBC: 0 % (ref 0.0–0.2)

## 2020-10-16 LAB — POCT ACTIVATED CLOTTING TIME: Activated Clotting Time: 241 seconds

## 2020-10-16 SURGERY — ABDOMINAL AORTOGRAM W/LOWER EXTREMITY
Anesthesia: LOCAL | Laterality: Right

## 2020-10-16 MED ORDER — HEPARIN (PORCINE) IN NACL 1000-0.9 UT/500ML-% IV SOLN
INTRAVENOUS | Status: AC
  Filled 2020-10-16: qty 500

## 2020-10-16 MED ORDER — SODIUM CHLORIDE 0.9% FLUSH: 3.0000 mL | INTRAVENOUS | Status: DC | PRN

## 2020-10-16 MED ORDER — SODIUM CHLORIDE 0.9% FLUSH
3.0000 mL | Freq: Two times a day (BID) | INTRAVENOUS | Status: DC
  Administered 2020-10-16 – 2020-10-17 (×2): 3 mL via INTRAVENOUS

## 2020-10-16 MED ORDER — HEPARIN SODIUM (PORCINE) 1000 UNIT/ML IJ SOLN
INTRAMUSCULAR | Status: AC
  Filled 2020-10-16: qty 1

## 2020-10-16 MED ORDER — LIDOCAINE HCL (PF) 1 % IJ SOLN
INTRAMUSCULAR | Status: AC
  Filled 2020-10-16: qty 30

## 2020-10-16 MED ORDER — ASPIRIN 81 MG PO CHEW: 81.0000 mg | CHEWABLE_TABLET | ORAL | Status: DC

## 2020-10-16 MED ORDER — ASPIRIN EC 81 MG PO TBEC
81.0000 mg | DELAYED_RELEASE_TABLET | Freq: Every day | ORAL | Status: DC
  Administered 2020-10-17 – 2020-10-18 (×2): 81 mg via ORAL
  Filled 2020-10-16 (×2): qty 1

## 2020-10-16 MED ORDER — LABETALOL HCL 5 MG/ML IV SOLN: 10.0000 mg | INTRAVENOUS | Status: DC | PRN

## 2020-10-16 MED ORDER — MIDAZOLAM HCL 2 MG/2ML IJ SOLN
INTRAMUSCULAR | Status: DC | PRN
  Administered 2020-10-16 (×5): 1 mg via INTRAVENOUS

## 2020-10-16 MED ORDER — NICOTINE 7 MG/24HR TD PT24
7.0000 mg | MEDICATED_PATCH | TRANSDERMAL | Status: DC
  Administered 2020-10-16 – 2020-10-17 (×2): 7 mg via TRANSDERMAL
  Filled 2020-10-16 (×3): qty 1

## 2020-10-16 MED ORDER — SODIUM CHLORIDE 0.9 % IV SOLN: 250.0000 mL | INTRAVENOUS | Status: DC | PRN

## 2020-10-16 MED ORDER — SODIUM CHLORIDE 0.9% FLUSH: 3.0000 mL | Freq: Two times a day (BID) | INTRAVENOUS | Status: DC

## 2020-10-16 MED ORDER — MIDAZOLAM HCL 2 MG/2ML IJ SOLN
INTRAMUSCULAR | Status: AC
  Filled 2020-10-16: qty 2

## 2020-10-16 MED ORDER — TICAGRELOR 90 MG PO TABS: 90.0000 mg | ORAL_TABLET | ORAL | Status: DC

## 2020-10-16 MED ORDER — HEPARIN (PORCINE) IN NACL 1000-0.9 UT/500ML-% IV SOLN
INTRAVENOUS | Status: DC | PRN
  Administered 2020-10-16 (×2): 500 mL

## 2020-10-16 MED ORDER — HEPARIN SODIUM (PORCINE) 1000 UNIT/ML IJ SOLN
INTRAMUSCULAR | Status: DC | PRN
  Administered 2020-10-16: 7000 [IU] via INTRAVENOUS
  Administered 2020-10-16: 2000 [IU] via INTRAVENOUS

## 2020-10-16 MED ORDER — FENTANYL CITRATE (PF) 100 MCG/2ML IJ SOLN
INTRAMUSCULAR | Status: DC | PRN
Start: 2020-10-16 — End: 2020-10-16
  Administered 2020-10-16: 50 ug via INTRAVENOUS
  Administered 2020-10-16 (×2): 25 ug via INTRAVENOUS
  Administered 2020-10-16: 50 ug via INTRAVENOUS
  Administered 2020-10-16: 25 ug via INTRAVENOUS

## 2020-10-16 MED ORDER — SPIRONOLACTONE 25 MG PO TABS: 25.0000 mg | ORAL_TABLET | Freq: Two times a day (BID) | ORAL | Status: DC

## 2020-10-16 MED ORDER — SODIUM CHLORIDE 0.9 % IV SOLN: INTRAVENOUS | Status: DC

## 2020-10-16 MED ORDER — ONDANSETRON HCL 4 MG/2ML IJ SOLN: 4.0000 mg | Freq: Four times a day (QID) | INTRAMUSCULAR | Status: DC | PRN

## 2020-10-16 MED ORDER — ROSUVASTATIN CALCIUM 20 MG PO TABS
40.0000 mg | ORAL_TABLET | Freq: Every day | ORAL | Status: DC
  Administered 2020-10-16 – 2020-10-18 (×3): 40 mg via ORAL
  Filled 2020-10-16 (×3): qty 2

## 2020-10-16 MED ORDER — ACETAMINOPHEN 325 MG PO TABS: 650.0000 mg | ORAL_TABLET | ORAL | Status: DC | PRN

## 2020-10-16 MED ORDER — FENTANYL CITRATE (PF) 100 MCG/2ML IJ SOLN
INTRAMUSCULAR | Status: AC
Start: 2020-10-16 — End: ?
  Filled 2020-10-16: qty 2

## 2020-10-16 MED ORDER — HYDRALAZINE HCL 20 MG/ML IJ SOLN: 5.0000 mg | INTRAMUSCULAR | Status: DC | PRN

## 2020-10-16 MED ORDER — FENTANYL CITRATE (PF) 100 MCG/2ML IJ SOLN
INTRAMUSCULAR | Status: AC
  Filled 2020-10-16: qty 2

## 2020-10-16 MED ORDER — METOPROLOL SUCCINATE ER 25 MG PO TB24: 25.0000 mg | ORAL_TABLET | Freq: Every day | ORAL | Status: DC

## 2020-10-16 MED ORDER — LIDOCAINE HCL (PF) 1 % IJ SOLN
INTRAMUSCULAR | Status: DC | PRN
  Administered 2020-10-16: 15 mL via INTRADERMAL

## 2020-10-16 MED ORDER — IODIXANOL 320 MG/ML IV SOLN
INTRAVENOUS | Status: DC | PRN
  Administered 2020-10-16: 250 mL via INTRA_ARTERIAL

## 2020-10-16 MED ORDER — SODIUM CHLORIDE 0.9 % IV SOLN: INTRAVENOUS | Status: AC

## 2020-10-16 MED ORDER — TICAGRELOR 90 MG PO TABS
90.0000 mg | ORAL_TABLET | Freq: Two times a day (BID) | ORAL | Status: DC
  Administered 2020-10-16 – 2020-10-18 (×4): 90 mg via ORAL
  Filled 2020-10-16 (×4): qty 1

## 2020-10-16 MED ORDER — IVABRADINE HCL 5 MG PO TABS
5.0000 mg | ORAL_TABLET | Freq: Two times a day (BID) | ORAL | Status: DC
  Administered 2020-10-17 – 2020-10-18 (×2): 5 mg via ORAL
  Filled 2020-10-16 (×4): qty 1

## 2020-10-16 MED ORDER — NITROGLYCERIN 0.4 MG SL SUBL: 0.4000 mg | SUBLINGUAL_TABLET | SUBLINGUAL | Status: DC | PRN

## 2020-10-16 MED ORDER — OXYCODONE-ACETAMINOPHEN 5-325 MG PO TABS
1.0000 | ORAL_TABLET | ORAL | Status: DC | PRN
  Administered 2020-10-16 – 2020-10-18 (×7): 1 via ORAL
  Filled 2020-10-16 (×7): qty 1

## 2020-10-16 MED ORDER — SACUBITRIL-VALSARTAN 49-51 MG PO TABS
1.0000 | ORAL_TABLET | Freq: Two times a day (BID) | ORAL | Status: DC
  Administered 2020-10-16: 1 via ORAL
  Filled 2020-10-16 (×2): qty 1

## 2020-10-16 MED ORDER — FUROSEMIDE 20 MG PO TABS
20.0000 mg | ORAL_TABLET | Freq: Every day | ORAL | Status: DC
  Administered 2020-10-17 – 2020-10-18 (×2): 20 mg via ORAL
  Filled 2020-10-16 (×2): qty 1

## 2020-10-16 SURGICAL SUPPLY — 27 items
BALLN MUSTANG 6X60X75 (BALLOONS) ×3
BALLOON MUSTANG 6X60X75 (BALLOONS) ×2 IMPLANT
CATH OMNI FLUSH 5F 65CM (CATHETERS) ×3 IMPLANT
CATH SHOCKWAVE 7.0X60 (CATHETERS) ×3 IMPLANT
CATH SOFT-VU 4F 65 STRAIGHT (CATHETERS) ×2 IMPLANT
CATH SOFT-VU STRAIGHT 4F 65CM (CATHETERS) ×3
CLOSURE PERCLOSE PROSTYLE (VASCULAR PRODUCTS) ×3 IMPLANT
KIT ENCORE 26 ADVANTAGE (KITS) ×3 IMPLANT
KIT MICROPUNCTURE NIT STIFF (SHEATH) ×3 IMPLANT
KIT PV (KITS) ×3 IMPLANT
SHEATH BRITE TIP 7FR 35CM (SHEATH) ×3 IMPLANT
SHEATH PINNACLE 5F 10CM (SHEATH) ×3 IMPLANT
SHEATH PINNACLE 7F 10CM (SHEATH) ×3 IMPLANT
SHEATH PROBE COVER 6X72 (BAG) ×6 IMPLANT
STENT ABSOLUTE PRO 7X60X135 (Permanent Stent) ×3 IMPLANT
STENT INNOVA 7X40X130 (Permanent Stent) IMPLANT
STENT VIABAHN 7X39X80 VBX (Permanent Stent) ×3 IMPLANT
STOPCOCK MORSE 400PSI 3WAY (MISCELLANEOUS) ×3 IMPLANT
SYR MEDRAD MARK 7 150ML (SYRINGE) ×3 IMPLANT
TAPE VIPERTRACK RADIOPAQ (MISCELLANEOUS) ×2 IMPLANT
TAPE VIPERTRACK RADIOPAQUE (MISCELLANEOUS) ×3
TRANSDUCER W/STOPCOCK (MISCELLANEOUS) ×3 IMPLANT
TRAY PV CATH (CUSTOM PROCEDURE TRAY) ×3 IMPLANT
TUBING CIL FLEX 10 FLL-RA (TUBING) ×3 IMPLANT
WIRE HITORQ VERSACORE ST 145CM (WIRE) ×3 IMPLANT
WIRE SPARTACORE .014X300CM (WIRE) ×3 IMPLANT
WIRE VERSACORE LOC 115CM (WIRE) ×3 IMPLANT

## 2020-10-16 NOTE — Plan of Care (Signed)
  Problem: Education: Goal: Knowledge of General Education information will improve Description: Including pain rating scale, medication(s)/side effects and non-pharmacologic comfort measures Outcome: Progressing   Problem: Clinical Measurements: Goal: Ability to maintain clinical measurements within normal limits will improve Outcome: Progressing Goal: Will remain free from infection Outcome: Progressing Goal: Diagnostic test results will improve Outcome: Progressing Goal: Respiratory complications will improve Outcome: Progressing Goal: Cardiovascular complication will be avoided Outcome: Progressing   Problem: Nutrition: Goal: Adequate nutrition will be maintained Outcome: Progressing   Problem: Coping: Goal: Level of anxiety will decrease Outcome: Progressing   Problem: Elimination: Goal: Will not experience complications related to bowel motility Outcome: Progressing Goal: Will not experience complications related to urinary retention Outcome: Progressing   Problem: Safety: Goal: Ability to remain free from injury will improve Outcome: Progressing   Problem: Skin Integrity: Goal: Risk for impaired skin integrity will decrease Outcome: Progressing

## 2020-10-16 NOTE — H&P (Signed)
Follow up visit  Subjective:   Mike [name], male    DOB: 1961-04-14, 59 y.o.   MRN: [id number]    HPI  C/C Critical limb ischemia  58 year old Caucasian male with CAD, culprit (RCA) and nonculprit (LCx) PCI after STEMI 07/2020, HFrEF, moderate MR, h/o cardiac arrest 2/2 Torsades post PPCI during index hospitalization, PAD with resting left leg pain  Patient continues to have exertional dyspnea, although this has improved since starting Entresto.  His biggest complaint today is left leg pain.  In addition to severe claudication, he will now also has resting pain.  Patient was evaluated by Dr. Caryl Comes.  Pending further up titration of heart failure management, he could be considered a candidate for ICD if EF does not recover 3 months post MI.    No current facility-administered medications on file prior to encounter.   Current Outpatient Medications on File Prior to Encounter  Medication Sig Dispense Refill  . aspirin 81 MG EC tablet Take 1 tablet (81 mg total) by mouth daily. Swallow whole. 90 tablet 3  . furosemide (LASIX) 20 MG tablet Take 1 tablet (20 mg total) by mouth daily. 30 tablet 2  . ivabradine (CORLANOR) 5 MG TABS tablet Take 1 tablet (5 mg total) by mouth 2 (two) times daily with a meal. 60 tablet 2  . metoprolol succinate (TOPROL-XL) 25 MG 24 hr tablet Take 1 tablet (25 mg total) by mouth at bedtime. Take with or immediately following a meal. 90 tablet 3  . nitroGLYCERIN (NITROSTAT) 0.4 MG SL tablet Place 1 tablet (0.4 mg total) under the tongue every 5 (five) minutes as needed for chest pain. 30 tablet 1  . rosuvastatin (CRESTOR) 40 MG tablet Take 1 tablet (40 mg total) by mouth daily. 90 tablet 3  . sacubitril-valsartan (ENTRESTO) 49-51 MG Take 1 tablet by mouth 2 (two) times daily. 60 tablet 2  . spironolactone (ALDACTONE) 50 MG tablet Take 0.5 tablets (25 mg total) by mouth daily. (Patient taking differently: Take 25 mg by mouth in the morning and at bedtime. ) 1  tablet 0  . ticagrelor (BRILINTA) 90 MG TABS tablet Take 1 tablet (90 mg total) by mouth 2 (two) times daily. 180 tablet 3  . nicotine (NICODERM CQ - DOSED IN MG/24 HR) 7 mg/24hr patch Place 1 patch (7 mg total) onto the skin daily. 30 patch 1  . oxyCODONE-acetaminophen (PERCOCET) 5-325 MG tablet Take 1 tablet by mouth every 4 (four) hours as needed for severe pain. 60 tablet 0    Cardiovascular & other pertient studies:  Abdominal Aortic Duplex 09/21/2020:  Moderate plaque noted in the proximal, mid and distal aorta. There is an  ulcerated plaque noted in the distal abdominal aorta. No AAA. Normal iliac  artery velocity.  Lower Extremity Arterial Duplex 09/21/2020:  The right SFA is occluded in the proximal segment with reconstitution at  the level of the popliteal artery with diffuse monophasic waveform below  the knee. Right profunda femoral artery has >50% stenosis. There is  moderate mixed plaque noted throughout the right lower extremity.  Monophasic waveform throughout the left lower extremity, indicates  significant proximal disease (iliac artery).   Left SFA is occluded in the proximal segment and reconstitutes just above popliteal artery and diffuse dampened monophasic waveform throughout the lower extremity below the  knee. There is moderate mixed plaque throughout the left lower extremity.   This exam reveals severely decreased perfusion of the right lower  extremity, noted at the dorsalis  pedis artery level (ABI 0.42) and  critically decreased perfusion of the left lower extremity, noted at the  dorsalis pedis and post tibial artery level (ABI 0.03).   Echocardiogram 09/11/2020:  Left ventricle cavity is moderately dilated. Moderate concentric  hypertrophy of the left ventricle. Moderate global and severe  inferolateral hypokinesis. LVEF 15-20%. Grade 1 diastolic dysfunction.  Normal left atrial pressure. Calculated EF 15%.  Mild to moderate mitral regurgitation.   Inadequate TR jet to estimate pulmonary artery systolic pressure. Normal  right atrial pressure.  Compared to previous study in 03/2020, LVEF is reduced from 30-35%. Mitral  regurgitation is marginally improved.   EKG 08/14/2020: Sinus rhythm 81 bpm Old inferior and anterior infarct  Coronary intervention 08/03/2020: LM: Distal 40% stenosis LAD: Mid 30% disease LCx: Subtotally occluded OM2, prox LCx 70% stenosis Successful percutaneous coronary intervention OM2-Prox LCx PTCA and overlapping stents placement  2.5 X 38 mm and 2.5 X 18 mm Resolute Onyx drug-eluting stents 100%--->0% stenosis. TIMI flow 0-->III Small caliber distal vessel with moderate diffuse disease RCA: Prox 30% stenosis. Patent mid RCA sttent 2.5 X 26 mm Resolute Onyx drug-eluting stent  LVEDP 42 mmHg  Echocardiogram 08/02/2020: 1. Moderate global and severe inferolateral hypokinesis. Left ventricular  ejection fraction, by estimation, is 30 to 35%. The left ventricle has  normal function. The left ventricle demonstrates regional wall motion  abnormalities (see scoring  diagram/findings for description). There is mild left ventricular  hypertrophy. Left ventricular diastolic parameters are consistent with  Grade II diastolic dysfunction (pseudonormalization).  2. Right ventricular systolic function is low normal. The right  ventricular size is normal.  3. Left atrial size was mildly dilated.  4. The mitral valve is grossly normal. Moderate mitral valve  regurgitation.  5. The aortic valve is tricuspid. Aortic valve regurgitation is not  visualized. Mild aortic valve sclerosis is present, with no evidence of  aortic valve stenosis.  6. The inferior vena cava is dilated in size with >50% respiratory  variability, suggesting right atrial pressure of 8 mmHg.   Coronary intervention 08/01/2020: LM: Distal 30% stenosis LAD: Mid 30% disease LCx: Subtotally occluded  OM2, bridging left-to-left and right-to-left collaterals RCA: Prox 30% stenosis. Mid 100% occlusion Successful percutaneous coronary intervention mid RCA PTCA and stent placement 2.5 X 26 mm Resolute Onyx drug-eluting stent 100%--->0% stenosis. TIMI flow 0-->III   Recent labs: 09/25/2020: Glucose 102, BUN/Cr 18/1.16. EGFR 68. Na/K 140/4.5.  NT pro BNP 2404  09/11/2020: Glucose 114, BUN/Cr 19/1.42. EGFR 53. Na/K 136/4.7.  Chol 95, TG 76, HDL 30, LDL 49  08/04/2020: Glucose 101, BUN/Cr 19/1.08. EGFR >60. Na/K 139/4.0. Rest of the CMP normal H/H 14/42. MCV 89. Platelets 184. WBC count 14k HbA1C 5.8% Chol 178, TG 75, HDL 40, LDL 123    Review of Systems  Cardiovascular: Positive for claudication and dyspnea on exertion. Negative for chest pain, leg swelling, palpitations and syncope.  Respiratory: Positive for shortness of breath.          There were no vitals filed for this visit.   There is no height or weight on file to calculate BMI. There were no vitals filed for this visit.   Objective:   Physical Exam Vitals and nursing note reviewed.  Constitutional:      General: He is not in acute distress. Neck:     Vascular: No JVD.  Cardiovascular:     Rate and Rhythm: Normal rate and regular rhythm.     Pulses:  Femoral pulses are 1+ on the right side and 1+ on the left side.      Dorsalis pedis pulses are 1+ on the right side and 1+ on the left side.       Posterior tibial pulses are 0 on the right side and 0 on the left side.     Heart sounds: Normal heart sounds. No murmur heard.      Comments: 2 mm area of dark discoloration on 2nd toe left foot. Pulmonary:     Effort: Pulmonary effort is normal.     Breath sounds: Normal breath sounds. No wheezing or rales.           Assessment & Recommendations:   59 year old Caucasian male with CAD, culprit (RCA) and nonculprit (LCx) PCI after STEMI 07/2020, HFrEF, moderate MR, h/o cardiac  arrest 2/2 Torsades post PPCI during index hospitalization, PAD with resting left leg pain  CAD: STEMI 07/2020.  Primary PCI to culprit vessel RCA. Resolute Onyx drug-eluting stent 2.5X26 mm DES Recurrent chest pain and shortness of breath requiring nonculprit PCI to OM2 during index hospitalization.  Successful percutaneous coronary intervention OM2-Prox LCx PTCA and overlapping stents placement  2.5 X 38 mm and 2.5 X 18 mm Resolute Onyx drug-eluting stents 100%--->0% stenosis. TIMI flow 0-->III Small caliber distal vessel with moderate diffuse disease Residual moderate distal left main, prox RCA, and mild mid LAD disease.  No exertional angina to indicate an revascularized severe CAD to be the cause of his persistent low EF. Recommend DAPT with aspirin and Brilinta at least till 07/2021. Continue metoprolol succinate 25 mg, Crestor 40 mg. LDL significantly improved.  HFrEF: EF 15-20% in spite of optimum revascularization.  I suspect he may have had reduced EF even prior to his STEMI, owing to his chronically occluded left circumflex, which is now revascularized. Currently on Entresto 24-26 mg bid, metoprolol succinate 25 mg, spironolactone 25 mg, Crestor 40 mg daily. Increase Entresto to 49-50 mg twice daily.  Added Corlanor 5 mg twice daily. Repeat echocardiogram in 12/2020.   PAD: In addition to underlying Rutherford class III claudication, he now also has resting leg pain and a small area of necrosis in 2nd toe left foot. PAD seen in both lower extremities, left worse than right. He has critical limb ischemia in left foot. Recommend peripheral angiography and possible intervention. Risks, benefits, alternate options discussed with the patient. Continue aspirin and Brilinta for dual antiplatelet therapy post MI.  Continue statin. Given his severe pain, I prescribed him Percocet.  Side effects discussed with the patient.  H/o cardiac arrest:  Medication Torsades post PPCI during index hospitalization with no recurrence EF remains 10-15% 6 weeks post MI Referred to EP, as above.  Hyperlipidemia: LDL down from 123 to 49. Continue Crestor 40 mg.  Snoring, sinus pauses: Noted to have up to 4-second sinus pauses during sleep.  Suspect he may have obstructive sleep apnea.    Tobacco dependence: Re-emphasized tobacco cessation.   Nigel Mormon, MD Pager: [phone] Office: [phone]

## 2020-10-17 ENCOUNTER — Encounter (HOSPITAL_COMMUNITY): Payer: Self-pay | Admitting: Cardiology

## 2020-10-17 ENCOUNTER — Inpatient Hospital Stay (HOSPITAL_COMMUNITY): Payer: BC Managed Care – PPO

## 2020-10-17 DIAGNOSIS — Z01810 Encounter for preprocedural cardiovascular examination: Secondary | ICD-10-CM

## 2020-10-17 DIAGNOSIS — F17210 Nicotine dependence, cigarettes, uncomplicated: Secondary | ICD-10-CM | POA: Diagnosis present

## 2020-10-17 DIAGNOSIS — I701 Atherosclerosis of renal artery: Secondary | ICD-10-CM | POA: Diagnosis present

## 2020-10-17 DIAGNOSIS — Z7902 Long term (current) use of antithrombotics/antiplatelets: Secondary | ICD-10-CM | POA: Diagnosis not present

## 2020-10-17 DIAGNOSIS — Z8674 Personal history of sudden cardiac arrest: Secondary | ICD-10-CM | POA: Diagnosis not present

## 2020-10-17 DIAGNOSIS — I2510 Atherosclerotic heart disease of native coronary artery without angina pectoris: Secondary | ICD-10-CM | POA: Diagnosis present

## 2020-10-17 DIAGNOSIS — Z8249 Family history of ischemic heart disease and other diseases of the circulatory system: Secondary | ICD-10-CM | POA: Diagnosis not present

## 2020-10-17 DIAGNOSIS — I70223 Atherosclerosis of native arteries of extremities with rest pain, bilateral legs: Secondary | ICD-10-CM | POA: Diagnosis present

## 2020-10-17 DIAGNOSIS — Z7982 Long term (current) use of aspirin: Secondary | ICD-10-CM | POA: Diagnosis not present

## 2020-10-17 DIAGNOSIS — T508X5A Adverse effect of diagnostic agents, initial encounter: Secondary | ICD-10-CM | POA: Diagnosis present

## 2020-10-17 DIAGNOSIS — I252 Old myocardial infarction: Secondary | ICD-10-CM | POA: Diagnosis not present

## 2020-10-17 DIAGNOSIS — E785 Hyperlipidemia, unspecified: Secondary | ICD-10-CM | POA: Diagnosis present

## 2020-10-17 DIAGNOSIS — I5022 Chronic systolic (congestive) heart failure: Secondary | ICD-10-CM | POA: Diagnosis present

## 2020-10-17 DIAGNOSIS — R34 Anuria and oliguria: Secondary | ICD-10-CM | POA: Diagnosis present

## 2020-10-17 DIAGNOSIS — N179 Acute kidney failure, unspecified: Secondary | ICD-10-CM

## 2020-10-17 DIAGNOSIS — N170 Acute kidney failure with tubular necrosis: Secondary | ICD-10-CM | POA: Diagnosis present

## 2020-10-17 DIAGNOSIS — Z79899 Other long term (current) drug therapy: Secondary | ICD-10-CM | POA: Diagnosis not present

## 2020-10-17 DIAGNOSIS — I739 Peripheral vascular disease, unspecified: Secondary | ICD-10-CM | POA: Diagnosis present

## 2020-10-17 DIAGNOSIS — I959 Hypotension, unspecified: Secondary | ICD-10-CM | POA: Diagnosis present

## 2020-10-17 DIAGNOSIS — Z955 Presence of coronary angioplasty implant and graft: Secondary | ICD-10-CM | POA: Diagnosis not present

## 2020-10-17 DIAGNOSIS — E872 Acidosis: Secondary | ICD-10-CM | POA: Diagnosis present

## 2020-10-17 DIAGNOSIS — I110 Hypertensive heart disease with heart failure: Secondary | ICD-10-CM | POA: Diagnosis present

## 2020-10-17 DIAGNOSIS — E871 Hypo-osmolality and hyponatremia: Secondary | ICD-10-CM | POA: Diagnosis present

## 2020-10-17 LAB — CBC
HCT: 38.9 % — ABNORMAL LOW (ref 39.0–52.0)
Hemoglobin: 12.7 g/dL — ABNORMAL LOW (ref 13.0–17.0)
MCH: 27.7 pg (ref 26.0–34.0)
MCHC: 32.6 g/dL (ref 30.0–36.0)
MCV: 84.9 fL (ref 80.0–100.0)
Platelets: 221 10*3/uL (ref 150–400)
RBC: 4.58 MIL/uL (ref 4.22–5.81)
RDW: 15.7 % — ABNORMAL HIGH (ref 11.5–15.5)
WBC: 11 10*3/uL — ABNORMAL HIGH (ref 4.0–10.5)
nRBC: 0 % (ref 0.0–0.2)

## 2020-10-17 LAB — NA AND K (SODIUM & POTASSIUM), RAND UR
Potassium Urine: 47 mmol/L
Sodium, Ur: 44 mmol/L

## 2020-10-17 LAB — BASIC METABOLIC PANEL
Anion gap: 13 (ref 5–15)
Anion gap: 15 (ref 5–15)
BUN: 77 mg/dL — ABNORMAL HIGH (ref 8–23)
BUN: 74 mg/dL — ABNORMAL HIGH (ref 8–23)
CO2: 16 mmol/L — ABNORMAL LOW (ref 22–32)
CO2: 16 mmol/L — ABNORMAL LOW (ref 22–32)
Calcium: 8.7 mg/dL — ABNORMAL LOW (ref 8.9–10.3)
Calcium: 8.5 mg/dL — ABNORMAL LOW (ref 8.9–10.3)
Chloride: 101 mmol/L (ref 98–111)
Chloride: 101 mmol/L (ref 98–111)
Creatinine, Ser: 4.92 mg/dL — ABNORMAL HIGH (ref 0.61–1.24)
Creatinine, Ser: 5.08 mg/dL — ABNORMAL HIGH (ref 0.61–1.24)
GFR, Estimated: 12 mL/min — ABNORMAL LOW (ref >60)
GFR, Estimated: 11 mL/min — ABNORMAL LOW (ref >60)
Glucose, Bld: 120 mg/dL — ABNORMAL HIGH (ref 70–99)
Glucose, Bld: 121 mg/dL — ABNORMAL HIGH (ref 70–99)
Potassium: 4.1 mmol/L (ref 3.5–5.1)
Potassium: 4.2 mmol/L (ref 3.5–5.1)
Sodium: 130 mmol/L — ABNORMAL LOW (ref 135–145)
Sodium: 132 mmol/L — ABNORMAL LOW (ref 135–145)

## 2020-10-17 LAB — OSMOLALITY: Osmolality: 306 mOsm/kg — ABNORMAL HIGH (ref 275–295)

## 2020-10-17 LAB — CREATININE, URINE, RANDOM: Creatinine, Urine: 87.68 mg/dL

## 2020-10-17 LAB — LACTIC ACID, PLASMA
Lactic Acid, Venous: 1.1 mmol/L (ref 0.5–1.9)
Lactic Acid, Venous: 3.6 mmol/L (ref 0.5–1.9)
Lactic Acid, Venous: 1.8 mmol/L (ref 0.5–1.9)
Lactic Acid, Venous: 1.1 mmol/L (ref 0.5–1.9)

## 2020-10-17 LAB — OSMOLALITY, URINE: Osmolality, Ur: 430 mOsm/kg (ref 300–900)

## 2020-10-17 LAB — URIC ACID: Uric Acid, Serum: 9.1 mg/dL — ABNORMAL HIGH (ref 3.7–8.6)

## 2020-10-17 LAB — CK: Total CK: 486 U/L — ABNORMAL HIGH (ref 49–397)

## 2020-10-17 MED ORDER — SODIUM BICARBONATE 650 MG PO TABS
1300.0000 mg | ORAL_TABLET | Freq: Three times a day (TID) | ORAL | Status: DC
  Administered 2020-10-17 – 2020-10-18 (×3): 1300 mg via ORAL
  Filled 2020-10-17 (×3): qty 2

## 2020-10-17 MED ORDER — SODIUM CHLORIDE 0.9 % IV BOLUS
250.0000 mL | Freq: Once | INTRAVENOUS | Status: AC
  Administered 2020-10-17: 250 mL via INTRAVENOUS

## 2020-10-17 MED ORDER — SODIUM CHLORIDE 0.9 % IV SOLN: INTRAVENOUS | Status: AC

## 2020-10-17 MED ORDER — SODIUM CHLORIDE 0.9 % IV BOLUS
200.0000 mL | Freq: Once | INTRAVENOUS | Status: AC
  Administered 2020-10-17: 200 mL via INTRAVENOUS

## 2020-10-17 NOTE — Consult Note (Signed)
Nephrology Consult  [facility]  Requesting provider: Nigel Mormon, MD   Assessment/Recommendations:   Oliguric AKI: Likely secondary to acute tubular injury in the setting of diminished renal perfusion in the context of bilateral renal artery stenosis. Contrast induced injury can be a possibility but typically do not expect to see changes from that around 48 hours post contrast exposure. -renal function lower since Aug, baseline Cr 0.4-0.5, then 1.2-1.4 from Aug onward, suspecting he has underlying CKD now from vascular disease and a cardiorenal picture -Agree with holding Entresto and spironolactone for now -UA with sediment exam, renal ultrasound to r/o obstruction -Supportive care/hemodynamic support. Maintain MAP>65 for optimal renal perfusion -no indication for renal replacement therapy -Continue to monitor daily Cr, Dose meds for GFR<15 -Monitor Daily I/Os, Daily weight  -Agree with holding ACE-I, avoid further nephrotoxins including NSAIDS, Morphine.  Unless absolutely necessary, avoid CT with contrast and/or MRI with gadolinium.  Critical limb ischemia/PAD status post right CIA and EIA intervention.  Hopeful for right to left femoral profunda bypass with vascular surgery     Hypotension: Improved, could be secondary to low EF  Metabolic acidosis. Likely secondary to AKI. If lower would recommend checking a blood gas to ensure he is not acidemic. Can start sodium bicarb 1300 mg 3 times daily. Monitor lactate  Hyponatremia (euvolemic on exam).  Serum and urine osmolarity as well as urine electrolytes ordered  HFrEF: EF 15 to 20% Entresto, metoprolol, spironolactone on hold due to hypotension   Recommendations conveyed to primary service.    [facility] 10/17/2020 1:21 PM   _____________________________________________________________________________________   History of Present Illness: Mike [name] is a/an 59 y.o.  male with a past medical history of coronary disease, history of STEMI status post PCI 07/2020, HFrEF (15 to 20%), moderate MR, history of cardiac arrest secondary to torsades, peripheral arterial disease, tobacco use, HTN who presents to [facility] with critical limb ischemia and is status post right CIA and EIA intervention. During the procedure he was found to have bilateral renal artery stenosis (90% stenosis bilaterally with good accessory muscles).  Received 200 cc of contrast.  Post procedure he has been having hypotensive episodes with blood pressure as low as 72/47 received some gentle IV fluids for that.  He was then found to have a creatinine of 4.9 up from 1.2 on 09/25/2020. Currently he does report pain in his legs which has been on-going. Denies fevers, chest pain, SOB, dizziness, dysgeusia, loss of appetite, episodes of confusion/brain fog, tremors, flank pain. He reports that he had a hard time peeing just now but just urinated ~663mL which was emptied.  Medications:  Current Facility-Administered Medications  Medication Dose Route Frequency Provider Last Rate Last Admin  . 0.9 %  sodium chloride infusion  250 mL Intravenous PRN Patwardhan, Manish J, MD      . 0.9 %  sodium chloride infusion   Intravenous Continuous Nigel Mormon, MD 50 mL/hr at 10/17/20 0929 New Bag at 10/17/20 0929  . acetaminophen (TYLENOL) tablet 650 mg  650 mg Oral Q4H PRN Patwardhan, Manish J, MD      . aspirin EC tablet 81 mg  81 mg Oral Daily Patwardhan, Manish J, MD   81 mg at 10/17/20 0818  . furosemide (LASIX) tablet 20 mg  20 mg Oral Daily Patwardhan, Manish J, MD   20 mg at 10/17/20 0818  . hydrALAZINE (APRESOLINE) injection 5 mg  5 mg Intravenous Q20 Min PRN Patwardhan, Manish  J, MD      . ivabradine (CORLANOR) tablet 5 mg  5 mg Oral BID WC Patwardhan, Manish J, MD   5 mg at 10/17/20 0820  . labetalol (NORMODYNE) injection 10 mg  10 mg Intravenous Q10 min PRN Patwardhan, Manish J, MD      . nicotine  (NICODERM CQ - dosed in mg/24 hr) patch 7 mg  7 mg Transdermal Q24H Patwardhan, Manish J, MD   7 mg at 10/16/20 2027  . nitroGLYCERIN (NITROSTAT) SL tablet 0.4 mg  0.4 mg Sublingual Q5 min PRN Patwardhan, Manish J, MD      . ondansetron (ZOFRAN) injection 4 mg  4 mg Intravenous Q6H PRN Patwardhan, Manish J, MD      . oxyCODONE-acetaminophen (PERCOCET/ROXICET) 5-325 MG per tablet 1 tablet  1 tablet Oral Q4H PRN Nigel Mormon, MD   1 tablet at 10/17/20 1313  . rosuvastatin (CRESTOR) tablet 40 mg  40 mg Oral Daily Patwardhan, Manish J, MD   40 mg at 10/17/20 0819  . sodium chloride flush (NS) 0.9 % injection 3 mL  3 mL Intravenous Q12H Patwardhan, Manish J, MD   3 mL at 10/17/20 0821  . sodium chloride flush (NS) 0.9 % injection 3 mL  3 mL Intravenous PRN Patwardhan, Manish J, MD      . ticagrelor (BRILINTA) tablet 90 mg  90 mg Oral BID Patwardhan, Manish J, MD   90 mg at 10/17/20 6468     ALLERGIES Patient has no known allergies.  MEDICAL HISTORY Past Medical History:  Diagnosis Date  . CAD (coronary artery disease)   . Heart attack ([place])   . Hyperlipidemia   . Hypertension      SOCIAL HISTORY Social History   Socioeconomic History  . Marital status: Single    Spouse name: Not on file  . Number of children: 1  . Years of education: Not on file  . Highest education level: Not on file  Occupational History  . Not on file  Tobacco Use  . Smoking status: Current Some Day Smoker    Packs/day: 0.50    Years: 40.00    Pack years: 20.00    Types: Cigarettes  . Smokeless tobacco: Never Used  Vaping Use  . Vaping Use: Never used  Substance and Sexual Activity  . Alcohol use: Yes    Alcohol/week: 10.0 standard drinks    Types: 10 Cans of beer per week    Comment: occ  . Drug use: Never  . Sexual activity: Not on file  Other Topics Concern  . Not on file  Social History Narrative  . Not on file   Social Determinants of Health   Financial Resource Strain:   .  Difficulty of Paying Living Expenses: Not on file  Food Insecurity:   . Worried About Charity fundraiser in the Last Year: Not on file  . Ran Out of Food in the Last Year: Not on file  Transportation Needs:   . Lack of Transportation (Medical): Not on file  . Lack of Transportation (Non-Medical): Not on file  Physical Activity:   . Days of Exercise per Week: Not on file  . Minutes of Exercise per Session: Not on file  Stress:   . Feeling of Stress : Not on file  Social Connections:   . Frequency of Communication with Friends and Family: Not on file  . Frequency of Social Gatherings with Friends and Family: Not on file  . Attends Religious  Services: Not on file  . Active Member of Clubs or Organizations: Not on file  . Attends Archivist Meetings: Not on file  . Marital Status: Not on file  Intimate Partner Violence:   . Fear of Current or Ex-Partner: Not on file  . Emotionally Abused: Not on file  . Physically Abused: Not on file  . Sexually Abused: Not on file     FAMILY HISTORY Family History  Problem Relation Age of Onset  . Heart disease Mother   . Heart disease Father   . Cancer Brother      No family history of kidney disease  Review of Systems: 12 systems reviewed Otherwise as per HPI, all other systems reviewed and negative  Physical Exam: Vitals:   10/17/20 1130 10/17/20 1225  BP: 107/62 111/60  Pulse: 60   Resp: 15 15  Temp: 97.6 F (36.4 C)   SpO2: 92%    Total I/O In: 780.3 [P.O.:480; I.V.:50.3; IV Piggyback:250] Out: 600 [Urine:600]  Intake/Output Summary (Last 24 hours) at 10/17/2020 1321 Last data filed at 10/17/2020 1227 Gross per 24 hour  Intake 1244.47 ml  Output 600 ml  Net 644.47 ml   General: well-appearing, no acute distress, laying flat in bed HEENT: anicteric sclera, oropharynx clear without lesions CV: regular rate, normal rhythm, no murmurs, no gallops, no rubs Lungs: clear to auscultation bilaterally, normal work of  breathing, no overt w/r/r/c Abd: soft, non-tender, non-distended Skin: no visible lesions or rashes Psych: alert, engaged, appropriate mood and affect Musculoskeletal: LLE cold to touch, no edema Neuro: normal speech, no gross focal deficits, no asterixis   Test Results Reviewed Lab Results  Component Value Date   NA 130 (L) 10/17/2020   K 4.1 10/17/2020   CL 101 10/17/2020   CO2 16 (L) 10/17/2020   BUN 77 (H) 10/17/2020   CREATININE 4.92 (H) 10/17/2020   CALCIUM 8.7 (L) 10/17/2020   ALBUMIN 4.3 09/25/2020     I have reviewed all relevant outside healthcare records related to the patient's kidney injury.

## 2020-10-17 NOTE — Progress Notes (Signed)
MD paged in regard to pts soft Bps. MD also made aware of pts low urine output. New order received and implemented. Will continue to monitor.

## 2020-10-17 NOTE — Plan of Care (Signed)

## 2020-10-17 NOTE — Consult Note (Addendum)
Hospital Consult    Reason for Consult:  Claudication/rest pain Requesting Physician:  Virgina Jock MRN #:  [id number]  History of Present Illness: This is a 59 y.o. male who presents with leg pain.  He was recently hospitalized for CAD and underwent cardiac catheterization after STEMI in August 2021.   He was found to have moderate MR.  He has history of cardiac arrest post PCI.   The pt is on Brilinta and aspirin.   He was readmitted yesterday & underwent aortogram for severe claudication and underwent successful intravascular lithotripsty, PTCA & stenting of right CIA and right distal iliac artery.  There was a perclose placed in the left CFA.   Per cardiology, there was no target for intervention in the LLE and felt the best option was to revascularize the right CIA and EIA to have right to left femoral-profunda bypass graft.  [facility] is consulted for further evaluation.  He states he has been having pain in his legs for about 6 months and the left leg is worse than the right.  He does have a scab on the 2nd toe of the left foot.  States he has pain at night and feels better when he hangs his foot off the bed. The distance that he can walk varies.  He continues to smoke.  He states before his heart attack, he was down to 1/2 ppd from 2 ppd.    Pt echo on 08/02/2020 reveals EF of 30-35%. Repeat Echo on 09/11/2020 revealed EF was reduced at 15% with mild to moderate MR.    He has had an AAA duplex and did not have evidence of AAA.  The pt is on a statin for cholesterol management.  The pt is on a daily aspirin.   Other AC:  Brilinta The pt is not on medication for hypertension.   The pt is not diabetic.   Tobacco hx:  current  Past Medical History:  Diagnosis Date  . CAD (coronary artery disease)   . Heart attack ([place])   . Hyperlipidemia   . Hypertension     Past Surgical History:  Procedure Laterality Date  . ABDOMINAL AORTOGRAM W/LOWER EXTREMITY N/A 10/16/2020   Procedure: ABDOMINAL  AORTOGRAM W/LOWER EXTREMITY;  Surgeon: Nigel Mormon, MD;  Location: [facility];  Service: Cardiovascular;  Laterality: N/A;  . CORONARY STENT INTERVENTION N/A 08/03/2020   Procedure: CORONARY STENT INTERVENTION;  Surgeon: Nigel Mormon, MD;  Location: [facility];  Service: Cardiovascular;  Laterality: N/A;  . CORONARY/GRAFT ACUTE MI REVASCULARIZATION N/A 08/01/2020   Procedure: Coronary/Graft Acute MI Revascularization;  Surgeon: Nigel Mormon, MD;  Location: [facility];  Service: Cardiovascular;  Laterality: N/A;  . INTRAVASCULAR LITHOTRIPSY Right 10/16/2020   Procedure: INTRAVASCULAR LITHOTRIPSY;  Surgeon: Nigel Mormon, MD;  Location: [facility];  Service: Cardiovascular;  Laterality: Right;  Common and External Iliac  . LEFT HEART CATH AND CORONARY ANGIOGRAPHY N/A 08/01/2020   Procedure: LEFT HEART CATH AND CORONARY ANGIOGRAPHY;  Surgeon: Nigel Mormon, MD;  Location: [facility];  Service: Cardiovascular;  Laterality: N/A;  . LEFT HEART CATH AND CORONARY ANGIOGRAPHY N/A 08/03/2020   Procedure: LEFT HEART CATH AND CORONARY ANGIOGRAPHY;  Surgeon: Nigel Mormon, MD;  Location: [facility];  Service: Cardiovascular;  Laterality: N/A;  . PERIPHERAL VASCULAR INTERVENTION Right 10/16/2020   Procedure: PERIPHERAL VASCULAR INTERVENTION;  Surgeon: Nigel Mormon, MD;  Location: [facility];  Service: Cardiovascular;  Laterality: Right;  Common and Iliac Stent    No Known Allergies  Prior to Admission medications   Medication Sig Start Date End Date Taking? Authorizing Provider  aspirin 81 MG EC tablet Take 1 tablet (81 mg total) by mouth daily. Swallow whole. 08/14/20  Yes Patwardhan, Manish J, MD  furosemide (LASIX) 20 MG tablet Take 1 tablet (20 mg total) by mouth daily. 10/05/20 01/03/21 Yes Patwardhan, Reynold Bowen, MD  ivabradine (CORLANOR) 5 MG TABS tablet Take 1 tablet (5 mg total) by mouth 2 (two) times  daily with a meal. 10/05/20  Yes Patwardhan, Manish J, MD  metoprolol succinate (TOPROL-XL) 25 MG 24 hr tablet Take 1 tablet (25 mg total) by mouth at bedtime. Take with or immediately following a meal. 10/04/20  Yes Patwardhan, Manish J, MD  nitroGLYCERIN (NITROSTAT) 0.4 MG SL tablet Place 1 tablet (0.4 mg total) under the tongue every 5 (five) minutes as needed for chest pain. 08/14/20 11/12/20 Yes Patwardhan, Manish J, MD  oxyCODONE-acetaminophen (PERCOCET) 5-325 MG tablet Take 1 tablet by mouth every 4 (four) hours as needed for severe pain. 10/05/20  Yes Patwardhan, Manish J, MD  rosuvastatin (CRESTOR) 40 MG tablet Take 1 tablet (40 mg total) by mouth daily. 10/04/20  Yes Patwardhan, Manish J, MD  sacubitril-valsartan (ENTRESTO) 49-51 MG Take 1 tablet by mouth 2 (two) times daily. 10/05/20  Yes Patwardhan, Manish J, MD  spironolactone (ALDACTONE) 50 MG tablet Take 0.5 tablets (25 mg total) by mouth daily. Patient taking differently: Take 25 mg by mouth in the morning and at bedtime.  09/20/20  Yes Patwardhan, Reynold Bowen, MD  ticagrelor (BRILINTA) 90 MG TABS tablet Take 1 tablet (90 mg total) by mouth 2 (two) times daily. 08/14/20  Yes Patwardhan, Manish J, MD  nicotine (NICODERM CQ - DOSED IN MG/24 HR) 7 mg/24hr patch Place 1 patch (7 mg total) onto the skin daily. 08/04/20 08/04/21  Nigel Mormon, MD    Social History   Socioeconomic History  . Marital status: Single    Spouse name: Not on file  . Number of children: 1  . Years of education: Not on file  . Highest education level: Not on file  Occupational History  . Not on file  Tobacco Use  . Smoking status: Current Some Day Smoker    Packs/day: 0.50    Years: 40.00    Pack years: 20.00    Types: Cigarettes  . Smokeless tobacco: Never Used  Vaping Use  . Vaping Use: Never used  Substance and Sexual Activity  . Alcohol use: Yes    Alcohol/week: 10.0 standard drinks    Types: 10 Cans of beer per week    Comment: occ  . Drug use:  Never  . Sexual activity: Not on file  Other Topics Concern  . Not on file  Social History Narrative  . Not on file   Social Determinants of Health   Financial Resource Strain:   . Difficulty of Paying Living Expenses: Not on file  Food Insecurity:   . Worried About Charity fundraiser in the Last Year: Not on file  . Ran Out of Food in the Last Year: Not on file  Transportation Needs:   . Lack of Transportation (Medical): Not on file  . Lack of Transportation (Non-Medical): Not on file  Physical Activity:   . Days of Exercise per Week: Not on file  . Minutes of Exercise per Session: Not on file  Stress:   . Feeling  of Stress : Not on file  Social Connections:   . Frequency of Communication with Friends and Family: Not on file  . Frequency of Social Gatherings with Friends and Family: Not on file  . Attends Religious Services: Not on file  . Active Member of Clubs or Organizations: Not on file  . Attends Archivist Meetings: Not on file  . Marital Status: Not on file  Intimate Partner Violence:   . Fear of Current or Ex-Partner: Not on file  . Emotionally Abused: Not on file  . Physically Abused: Not on file  . Sexually Abused: Not on file     Family History  Problem Relation Age of Onset  . Heart disease Mother   . Heart disease Father   . Cancer Brother     ROS: [x]  Positive   [ ]  Negative   [ ]  All sytems reviewed and are negative  Cardiac: [x]  recent MI [x]  shortness of breath  Vascular: [x]  pain in legs while walking [x]  pain in legs at rest [x]  pain in legs at night [x]  scab on 2nd toe  []  hx of DVT []  swelling in legs  Pulmonary: []  asthma/wheezing []  home O2  Neurologic: []  hx of CVA []  mini stroke   Hematologic: []  hx of cancer  Endocrine:   []  diabetes []  thyroid disease  GI []  GERD  GU: []  CKD/renal failure []  HD--[]  M/W/F or []  T/T/S  Psychiatric: []  anxiety []  depression  Musculoskeletal: []  arthritis []  joint  pain  Integumentary: []  rashes []  ulcers  Constitutional: []  fever  []  chills  Physical Examination  Vitals:   10/17/20 0808 10/17/20 0816  BP: 94/63 104/67  Pulse: 66 82  Resp: 20 17  Temp:  98 F (36.7 C)  SpO2: 95% 94%   Body mass index is 20.89 kg/m.  General:  WDWN in NAD Gait: Not observed HENT: WNL, normocephalic Pulmonary: normal non-labored breathing Cardiac: regular; without carotid bruits Abdomen:  soft, NT/ND; aortic pulse is not palpable Skin: without rashes Vascular Exam/Pulses:  Right Left  Radial 2+ (normal) Not palpable Monophasic doppler  DP Unable to palpate Unable to palpate  PT Unable to palpate Unable to palpate   Extremities:left foot cool; small scab on left 2nd toe Musculoskeletal: no muscle wasting or atrophy  Neurologic: A&O X 3; speech is fluent/normal Psychiatric:  The pt has normal affect.   CBC    Component Value Date/Time   WBC 11.0 (H) 10/17/2020 0825   RBC 4.58 10/17/2020 0825   HGB 12.7 (L) 10/17/2020 0825   HCT 38.9 (L) 10/17/2020 0825   PLT 221 10/17/2020 0825   MCV 84.9 10/17/2020 0825   MCV 85.0 03/16/2016 1642   MCH 27.7 10/17/2020 0825   MCHC 32.6 10/17/2020 0825   RDW 15.7 (H) 10/17/2020 0825   LYMPHSABS 1.5 08/02/2020 0322   MONOABS 1.2 (H) 08/02/2020 0322   EOSABS 0.0 08/02/2020 0322   BASOSABS 0.0 08/02/2020 0322    BMET    Component Value Date/Time   NA 140 09/25/2020 0903   K 4.5 09/25/2020 0903   CL 108 (H) 09/25/2020 0903   CO2 18 (L) 09/25/2020 0903   GLUCOSE 102 (H) 09/25/2020 0903   GLUCOSE 101 (H) 08/04/2020 0426   BUN 15 09/25/2020 0903   CREATININE 1.16 09/25/2020 0903   CALCIUM 9.4 09/25/2020 0903   GFRNONAA 68 09/25/2020 0903   GFRAA 78 09/25/2020 0903    COAGS: No results found for: INR, PROTIME  Non-Invasive Vascular Imaging:   none   ASSESSMENT/PLAN: This is a 59 y.o. male with PAD with severe claudication and rest pain with left leg worse than right.    -pt with recent  MI and cardiac arrest now with EF of 15% per echo in September. He has had recent cardiac stents and now on Brilinta.  He will need to be off his Brilinta prior to procedure. Dr. Trula Slade will d/w Dr. Virgina Jock about this.  -will plan for right to left femoral to femoral bypass grafting.   -pt has not had carotid duplex-this has been ordered.     Leontine Locket, PA-C [facility] [phone]  I agree with the above.  I have seen and evaluated the patient.  Briefly this is a 59 year old gentleman with severe lower extremity peripheral vascular disease with rest pain, left greater than right.  Yesterday he underwent shockwave lithotripsy and stenting of the right iliac system in anticipation of a right to left femoral-femoral bypass graft.  We discussed proceeding with a femoral-femoral bypass graft in the near future.  I discussed the details of the procedure as well as the risks and benefits, including the risk of groin complications, and the need for future interventions.  He does have an area on his left second toe that is of concern.  If this progresses he potentially would require an outflow procedure, however I would not do that in the initial setting given his significant cardiac issues  He is 2 months out from PCI and is maintained on aspirin and Brilinta.  I will have to discuss with Dr. Robb Matar our options regarding medical therapy for his PCI with upcoming surgery.  I am ordering a carotid duplex for preoperative evaluation.  Annamarie Major

## 2020-10-17 NOTE — Progress Notes (Signed)
Subjective:  Generalized pain Denies abdominal pain  Objective:  Vital Signs in the last 24 hours: Temp:  [97.7 F (36.5 C)-98.6 F (37 C)] 98 F (36.7 C) (10/20 0816) Pulse Rate:  [44-120] 82 (10/20 0816) Resp:  [6-27] 17 (10/20 0816) BP: (73-140)/(40-83) 104/67 (10/20 0816) SpO2:  [0 %-100 %] 94 % (10/20 0816) Weight:  [69.9 kg] 69.9 kg (10/19 0943)  Intake/Output from previous day: 10/19 0701 - 10/20 0700 In: 464.2 [I.V.:464.2] Out: -   Physical Exam Vitals and nursing note reviewed.  Constitutional:      General: He is not in acute distress.    Appearance: He is well-developed.  HENT:     Head: Normocephalic and atraumatic.  Eyes:     Conjunctiva/sclera: Conjunctivae normal.     Pupils: Pupils are equal, round, and reactive to light.  Neck:     Vascular: No JVD.  Cardiovascular:     Rate and Rhythm: Normal rate and regular rhythm.     Pulses: Intact distal pulses.          Femoral pulses are 2+ on the right side and 1+ on the left side.      Popliteal pulses are 1+ on the right side and 0 on the left side.       Dorsalis pedis pulses are 1+ on the right side and 0 on the left side.       Posterior tibial pulses are 0 on the right side and 0 on the left side.     Heart sounds: No murmur heard.      Comments: Left leg is cold and mottled Small <9mm area of dark discoloration on left foot second toe Pulmonary:     Effort: Pulmonary effort is normal.     Breath sounds: Normal breath sounds. No wheezing or rales.  Abdominal:     General: Bowel sounds are normal.     Palpations: Abdomen is soft.     Tenderness: There is no rebound.  Musculoskeletal:        General: No tenderness. Normal range of motion.     Left lower leg: No edema.  Lymphadenopathy:     Cervical: No cervical adenopathy.  Skin:    General: Skin is warm and dry.  Neurological:     Mental Status: He is alert and oriented to person, place, and time.     Cranial Nerves: No cranial nerve deficit.       Lab Results: BMP Recent Labs    08/21/20 0851 08/21/20 0851 09/11/20 0806 09/25/20 0903 10/17/20 0825  NA 136   < > 136 140 130*  K 5.4*   < > 4.7 4.5 4.1  CL 99   < > 101 108* 101  CO2 18*   < > 18* 18* 16*  GLUCOSE 112*   < > 114* 102* 120*  BUN 17   < > 19 15 77*  CREATININE 1.46*   < > 1.42* 1.16 4.92*  CALCIUM 9.6   < > 9.5 9.4 8.7*  GFRNONAA 51*   < > 53* 68 12*  GFRAA 59*  --  61 78  --    < > = values in this interval not displayed.    CBC Recent Labs  Lab 10/17/20 0825  WBC 11.0*  RBC 4.58  HGB 12.7*  HCT 38.9*  PLT 221  MCV 84.9  MCH 27.7  MCHC 32.6  RDW 15.7*    HEMOGLOBIN A1C Lab Results  Component Value Date  HGBA1C 5.8 (H) 08/02/2020   MPG 119.76 08/02/2020     BNP (last 3 results) Recent Labs    08/03/20 0644  BNP 751.4*    Lipid Panel     Component Value Date/Time   CHOL 95 (L) 09/11/2020 0806   TRIG 76 09/11/2020 0806   HDL 30 (L) 09/11/2020 0806   CHOLHDL 3.2 09/11/2020 0806   CHOLHDL 4.5 08/02/2020 0707   VLDL 15 08/02/2020 0707   LDLCALC 49 09/11/2020 0806     Hepatic Function Panel Recent Labs    08/02/20 0026 09/25/20 0903  PROT 7.5 7.0  ALBUMIN 4.2 4.3  AST 29 22  ALT 22 25  ALKPHOS 85 82  BILITOT 1.0 0.8   Cardiac Studies:  PV intervention 10/16/2020: Successful intravascular lithotripsy, PTCA and stenting     7.0X39 mm balloon expandable Viabahn VBX stent Rt common iliac artery      7.0X60 mm self expanding Absolute Pro stent Rt distal iliac artery  No named vessels at ankle level in both lower extremities.  Will consult vascular surgery for Rt fem-to-left profunda bypass given left foot critical limb ischemia.  Abdominal Aortic Duplex 09/21/2020:  Moderate plaque noted in the proximal, mid and distal aorta. There is an  ulcerated plaque noted in the distal abdominal aorta. No AAA. Normal iliac  artery velocity.  Lower Extremity Arterial Duplex 09/21/2020:  The right SFA is occluded in  the proximal segment with reconstitution at  the level of the popliteal artery with diffuse monophasic waveform below  the knee. Right profunda femoral artery has >50% stenosis. There is  moderate mixed plaque noted throughout the right lower extremity.  Monophasic waveform throughout the left lower extremity, indicates  significant proximal disease (iliac artery).   Left SFA is occluded in the proximal segment and reconstitutes just above popliteal artery and diffuse dampened monophasic waveform throughout the lower extremity below the  knee. There is moderate mixed plaque throughout the left lower extremity.   This exam reveals severely decreased perfusion of the right lower  extremity, noted at the dorsalis pedis artery level (ABI 0.42) and  critically decreased perfusion of the left lower extremity, noted at the  dorsalis pedis and post tibial artery level (ABI 0.03).   Echocardiogram 09/11/2020:  Left ventricle cavity is moderately dilated. Moderate concentric  hypertrophy of the left ventricle. Moderate global and severe  inferolateral hypokinesis. LVEF 15-20%. Grade 1 diastolic dysfunction.  Normal left atrial pressure. Calculated EF 15%.  Mild to moderate mitral regurgitation.  Inadequate TR jet to estimate pulmonary artery systolic pressure. Normal  right atrial pressure.  Compared to previous study in 03/2020, LVEF is reduced from 30-35%. Mitral  regurgitation is marginally improved.   EKG 08/14/2020: Sinus rhythm 81 bpm Old inferior and anterior infarct  Coronary intervention 08/03/2020: LM: Distal 40% stenosis LAD:Mid 30% disease LCx: Subtotally occluded OM2, prox LCx 70% stenosis Successful percutaneous coronary intervention OM2-Prox LCx PTCA and overlapping stents placement  2.5 X 38 mm and 2.5 X 18 mm Resolute Onyx drug-eluting stents 100%--->0% stenosis. TIMI flow 0-->III Small caliber distal vessel with moderate diffuse  disease RCA: Prox 30% stenosis. Patent mid RCA sttent 2.5 X 26 mm Resolute Onyx drug-eluting stent  LVEDP 42 mmHg  Echocardiogram 08/02/2020: 1. Moderate global and severe inferolateral hypokinesis. Left ventricular  ejection fraction, by estimation, is 30 to 35%. The left ventricle has  normal function. The left ventricle demonstrates regional wall motion  abnormalities (see scoring  diagram/findings for description). There is mild  left ventricular  hypertrophy. Left ventricular diastolic parameters are consistent with  Grade II diastolic dysfunction (pseudonormalization).  2. Right ventricular systolic function is low normal. The right  ventricular size is normal.  3. Left atrial size was mildly dilated.  4. The mitral valve is grossly normal. Moderate mitral valve  regurgitation.  5. The aortic valve is tricuspid. Aortic valve regurgitation is not  visualized. Mild aortic valve sclerosis is present, with no evidence of  aortic valve stenosis.  6. The inferior vena cava is dilated in size with >50% respiratory  variability, suggesting right atrial pressure of 8 mmHg.   Coronary intervention 08/01/2020: LM: Distal 30% stenosis LAD:Mid 30% disease LCx: Subtotally occluded OM2, bridging left-to-left and right-to-left collaterals RCA: Prox 30% stenosis. Mid 100% occlusion Successful percutaneous coronary intervention mid RCA PTCA and stent placement 2.5 X 26 mm Resolute Onyx drug-eluting stent 100%--->0% stenosis. TIMI flow 0-->III  Assessment & Recommendations: 59 year old Caucasian male with CAD, culprit (RCA) and nonculprit (LCx) PCI after STEMI 07/2020, HFrEF, moderate MR, h/o cardiac arrest 2/2 [place] post PPCI during index hospitalization, PAD with critical limb ischemia, now with hypotension and AKI  AKI: Contrast use, renal artery stenosis, hypotension, HFrEF contributing  Will gently hydrate. Strict I/O. If no improvement, will need  dopamine, central line, and ICU transfer  Hypotension: No bleed suspected. Lactic acid pending.  Likely due to dehydration. Low EF. Clinically, does not appear to be in shock at the moment  Critical limb ischemia: No targets for endovascular intervention in LLE. Underwent Rt CIA and EIA intervention with hopes for Rt-Lt fem-profunda bypass. Discussed with Dr. Trula Slade. Appreciate his input.  Should he undergo a limb salvage surgery, will recommend admission 2 days prior, start IV cangrelor, stop brilinta 12 hrs after to reduce perioperative bleeding, with hopes of avoiding stent thrombosis. Obviously, his AKI would need to improve prior to such surgery.   CAD: STEMI 07/2020.  Primary PCI to culprit vessel RCA. Resolute Onyx drug-eluting stent 2.5X26 mm DES Recurrent chest pain and shortness of breath requiring nonculprit PCI to OM2 during index hospitalization.  Successful percutaneous coronary intervention OM2-Prox LCx PTCA and overlapping stents placement  2.5 X 38 mm and 2.5 X 18 mm Resolute Onyx drug-eluting stents 100%--->0% stenosis. TIMI flow 0-->III Small caliber distal vessel with moderate diffuse disease Residual moderate distal left main, prox RCA, and mild mid LAD disease.  No exertional angina to indicate un-revascularized severe CAD to be the cause of his persistent low EF. Recommend DAPT with aspirin and Brilinta at least till 07/2021, with exception of perioperative interruption for limb salvage surgery.  Continue metoprolol succinate 25 mg, Crestor 40 mg. LDL significantly improved.  HFrEF: EF 15-20% in spite of optimum revascularization.  I suspect he may have had reduced EF even prior to his STEMI, owing to his chronically occluded left circumflex, which is now revascularized. Holding Entresto, metoprolol, spironolactone due to hypotension, AKI  Multiple organ failure complicating his care.   CRITICAL CARE Performed by: Vernell Leep    Total critical care time: 45 minutes   Critical care time was exclusive of separately billable procedures and treating other patients.   Critical care was necessary to treat or prevent imminent or life-threatening deterioration.   Critical care was time spent personally by me on the following activities: development of treatment plan with patient and/or surrogate as well as nursing, discussions with consultants, evaluation of patient's response to treatment, examination of patient, obtaining history from patient or surrogate, ordering and performing treatments  and interventions, ordering and review of laboratory studies, ordering and review of radiographic studies, pulse oximetry and re-evaluation of patient's condition.       Nigel Mormon, MD Pager: [phone] Office: [phone]

## 2020-10-17 NOTE — Progress Notes (Signed)
CRITICAL VALUE ALERT  Critical Value:  Lactic acid 3.6  Date & Time Notied:  10/17/20@ 1218  Provider Notified: Notified dr Virgina Jock  Orders Received/Actions taken: new orders rec'd to recheck at 64, will continue to monitor.

## 2020-10-17 NOTE — Progress Notes (Signed)
Carotid artery duplex completed. Refer to "CV Proc" under chart review to view preliminary results.  10/17/2020 2:27 PM Kelby Aline., MHA, RVT, RDCS, RDMS

## 2020-10-18 ENCOUNTER — Telehealth: Payer: Self-pay

## 2020-10-18 ENCOUNTER — Encounter (HOSPITAL_COMMUNITY): Payer: Self-pay | Admitting: Cardiology

## 2020-10-18 LAB — BASIC METABOLIC PANEL
Anion gap: 12 (ref 5–15)
BUN: 68 mg/dL — ABNORMAL HIGH (ref 8–23)
CO2: 16 mmol/L — ABNORMAL LOW (ref 22–32)
Calcium: 8.2 mg/dL — ABNORMAL LOW (ref 8.9–10.3)
Chloride: 105 mmol/L (ref 98–111)
Creatinine, Ser: 4.75 mg/dL — ABNORMAL HIGH (ref 0.61–1.24)
GFR, Estimated: 13 mL/min — ABNORMAL LOW (ref >60)
Glucose, Bld: 92 mg/dL (ref 70–99)
Potassium: 3.7 mmol/L (ref 3.5–5.1)
Sodium: 133 mmol/L — ABNORMAL LOW (ref 135–145)

## 2020-10-18 LAB — URINALYSIS, ROUTINE W REFLEX MICROSCOPIC
Bacteria, UA: NONE SEEN
Bilirubin Urine: NEGATIVE
Glucose, UA: NEGATIVE mg/dL
Ketones, ur: NEGATIVE mg/dL
Leukocytes,Ua: NEGATIVE
Nitrite: NEGATIVE
Protein, ur: 100 mg/dL — AB
Specific Gravity, Urine: 1.023 (ref 1.005–1.030)
pH: 6 (ref 5.0–8.0)

## 2020-10-18 MED ORDER — OXYCODONE-ACETAMINOPHEN 5-325 MG PO TABS: 1.0000 | ORAL_TABLET | Freq: Three times a day (TID) | ORAL | 0 refills | Status: DC | PRN

## 2020-10-18 MED ORDER — FUROSEMIDE 20 MG PO TABS: 20.0000 mg | ORAL_TABLET | Freq: Every day | ORAL | 2 refills | Status: DC | PRN

## 2020-10-18 MED FILL — Fentanyl Citrate Preservative Free (PF) Inj 100 MCG/2ML: INTRAMUSCULAR | Qty: 2 | Status: AC

## 2020-10-18 NOTE — Plan of Care (Signed)

## 2020-10-18 NOTE — Progress Notes (Signed)
  Progress Note    10/18/2020 8:02 AM 2 Days Post-Op  Subjective:  No complaints this morning   Vitals:   10/18/20 0400 10/18/20 0535  BP: (!) 93/56   Pulse: 62   Resp: 15 19  Temp: 98.7 F (37.1 C)   SpO2: 96%    Physical Exam: Lungs:  Non labored Extremities:  Left second toe tissue loss Neurologic: A&O  CBC    Component Value Date/Time   WBC 11.0 (H) 10/17/2020 0825   RBC 4.58 10/17/2020 0825   HGB 12.7 (L) 10/17/2020 0825   HCT 38.9 (L) 10/17/2020 0825   PLT 221 10/17/2020 0825   MCV 84.9 10/17/2020 0825   MCV 85.0 03/16/2016 1642   MCH 27.7 10/17/2020 0825   MCHC 32.6 10/17/2020 0825   RDW 15.7 (H) 10/17/2020 0825   LYMPHSABS 1.5 08/02/2020 0322   MONOABS 1.2 (H) 08/02/2020 0322   EOSABS 0.0 08/02/2020 0322   BASOSABS 0.0 08/02/2020 0322    BMET    Component Value Date/Time   NA 132 (L) 10/17/2020 1413   NA 140 09/25/2020 0903   K 4.2 10/17/2020 1413   CL 101 10/17/2020 1413   CO2 16 (L) 10/17/2020 1413   GLUCOSE 121 (H) 10/17/2020 1413   BUN 74 (H) 10/17/2020 1413   BUN 15 09/25/2020 0903   CREATININE 5.08 (H) 10/17/2020 1413   CALCIUM 8.5 (L) 10/17/2020 1413   GFRNONAA 11 (L) 10/17/2020 1413   GFRAA 78 09/25/2020 0903    INR No results found for: INR   Intake/Output Summary (Last 24 hours) at 10/18/2020 0802 Last data filed at 10/18/2020 0535 Gross per 24 hour  Intake 2226.08 ml  Output 1650 ml  Net 576.08 ml     Assessment/Plan:  59 y.o. male is s/p R iliac stent 2 Days Post-Op   Pre-op carotid duplex demonstrating 1-39% stenosis bilateral ICA Plan is for R to L fem fem bypass in the near future Dr. Trula Slade to discuss management of Brilinta perioperatively with Dr. Theophilus Bones for discharge from vascular standpoint   Dagoberto Ligas, PA-C [facility] [phone] 10/18/2020 8:02 AM

## 2020-10-18 NOTE — Discharge Instructions (Signed)
Peripheral Vascular Disease  Peripheral vascular disease (PVD) is a disease of the blood vessels that are not part of your heart and brain. A simple term for PVD is poor circulation. In most cases, PVD narrows the blood vessels that carry blood from your heart to the rest of your body. This can reduce the supply of blood to your arms, legs, and internal organs, like your stomach or kidneys. However, PVD most often affects a person's lower legs and feet. Without treatment, PVD tends to get worse. PVD can also lead to acute ischemic limb. This is when an arm or leg suddenly cannot get enough blood. This is a medical emergency. Follow these instructions at home: Lifestyle  Do not use any products that contain nicotine or tobacco, such as cigarettes and e-cigarettes. If you need help quitting, ask your doctor.  Lose weight if you are overweight. Or, stay at a healthy weight as told by your doctor.  Eat a diet that is low in fat and cholesterol. If you need help, ask your doctor.  Exercise regularly. Ask your doctor for activities that are right for you. General instructions  Take over-the-counter and prescription medicines only as told by your doctor.  Take good care of your feet: ? Wear comfortable shoes that fit well. ? Check your feet often for any cuts or sores.  Keep all follow-up visits as told by your doctor This is important. Contact a doctor if:  You have cramps in your legs when you walk.  You have leg pain when you are at rest.  You have coldness in a leg or foot.  Your skin changes.  You are unable to get or have an erection (erectile dysfunction).  You have cuts or sores on your feet that do not heal. Get help right away if:  Your arm or leg turns cold, numb, and blue.  Your arms or legs become red, warm, swollen, painful, or numb.  You have chest pain.  You have trouble breathing.  You suddenly have weakness in your face, arm, or leg.  You become very  confused or you cannot speak.  You suddenly have a very bad headache.  You suddenly cannot see. Summary  Peripheral vascular disease (PVD) is a disease of the blood vessels.  A simple term for PVD is poor circulation. Without treatment, PVD tends to get worse.  Treatment may include exercise, low fat and low cholesterol diet, and quitting smoking. This information is not intended to replace advice given to you by your health care provider. Make sure you discuss any questions you have with your health care provider. Document Revised: 11/27/2017 Document Reviewed: 01/22/2017 Elsevier Patient Education  2020 [employer].   Peripheral Vascular Disease  Peripheral vascular disease (PVD) is a disease of the blood vessels that are not part of your heart and brain. A simple term for PVD is poor circulation. In most cases, PVD narrows the blood vessels that carry blood from your heart to the rest of your body. This can reduce the supply of blood to your arms, legs, and internal organs, like your stomach or kidneys. However, PVD most often affects a person's lower legs and feet. Without treatment, PVD tends to get worse. PVD can also lead to acute ischemic limb. This is when an arm or leg suddenly cannot get enough blood. This is a medical emergency. Follow these instructions at home: Lifestyle  Do not use any products that contain nicotine or tobacco, such as cigarettes and e-cigarettes.  If you need help quitting, ask your doctor.  Lose weight if you are overweight. Or, stay at a healthy weight as told by your doctor.  Eat a diet that is low in fat and cholesterol. If you need help, ask your doctor.  Exercise regularly. Ask your doctor for activities that are right for you. General instructions  Take over-the-counter and prescription medicines only as told by your doctor.  Take good care of your feet: ? Wear comfortable shoes that fit well. ? Check your feet often for any cuts or  sores.  Keep all follow-up visits as told by your doctor This is important. Contact a doctor if:  You have cramps in your legs when you walk.  You have leg pain when you are at rest.  You have coldness in a leg or foot.  Your skin changes.  You are unable to get or have an erection (erectile dysfunction).  You have cuts or sores on your feet that do not heal. Get help right away if:  Your arm or leg turns cold, numb, and blue.  Your arms or legs become red, warm, swollen, painful, or numb.  You have chest pain.  You have trouble breathing.  You suddenly have weakness in your face, arm, or leg.  You become very confused or you cannot speak.  You suddenly have a very bad headache.  You suddenly cannot see. Summary  Peripheral vascular disease (PVD) is a disease of the blood vessels.  A simple term for PVD is poor circulation. Without treatment, PVD tends to get worse.  Treatment may include exercise, low fat and low cholesterol diet, and quitting smoking. This information is not intended to replace advice given to you by your health care provider. Make sure you discuss any questions you have with your health care provider. Document Revised: 11/27/2017 Document Reviewed: 01/22/2017 Elsevier Patient Education  2020 [employer].

## 2020-10-18 NOTE — Telephone Encounter (Signed)
-----   Message from Nigel Mormon, MD sent at 10/18/2020 12:21 PM EDT ----- Regarding: [facility] Discharge follow up: TOC: Needed Follow up appt: 10/22 Discharge diagnosis: PAD, AKI Discharge date: Scheduled  Thanks MJP

## 2020-10-18 NOTE — Progress Notes (Signed)
[facility] Progress Note    Assessment/ Plan:   Oliguric AKI: Likely secondary to acute tubular injury in the setting of diminished renal perfusion in the context of bilateral renal artery stenosis. Contrast induced injury can be a possibility but typically do not expect to see changes from that around 48 hours post contrast exposure. -renal function lower since Aug, baseline Cr 0.4-0.5, then 1.2-1.4 from Aug onward, suspecting he has underlying CKD now from vascular disease and a cardiorenal picture -renal function slightly better today, urine output much better -given trajectory so far would be okay for him being discharged but will need labs soon after discharge to make sure his kidney function continues to improve -Agree with holding Entresto and spironolactone for now -UA with sediment exam indicative of ATN picture, renal ultrasound w/o obstruction -Supportive care/hemodynamic support. Maintain MAP>65 for optimal renal perfusion -no indication for renal replacement therapy -Continue to monitor daily Cr, Dose meds for GFR<15 -Monitor Daily I/Os, Daily weight  -Agree with holding ACE-I, avoid further nephrotoxins including NSAIDS, Morphine.  Unless absolutely necessary, avoid CT with contrast and/or MRI with gadolinium.  Critical limb ischemia/PAD status post right CIA and EIA intervention.  Hopeful for right to left femoral profunda bypass with vascular surgery     Hypotension: Improved, could be secondary to low EF  Metabolic acidosis. Likely secondary to AKI. If lower would recommend checking a blood gas to ensure he is not acidemic. Can start sodium bicarb 1300 mg 3 times daily. Monitor lactate  Hyponatremia (euvolemic on exam).  improving  HFrEF: EF 15 to 20% Entresto, metoprolol, spironolactone on hold due to hypotension  Discussed with primary service.   Subjective:   Hypotensive overnight, urine output much better. No complaints from patient other  than some leg soreness.   Objective:   BP (!) 96/50 (BP Location: Left Arm)   Pulse 62   Temp 98.3 F (36.8 C) (Oral)   Resp 16   Ht 6' (1.829 m)   Wt 69.9 kg   SpO2 95%   BMI 20.89 kg/m   Intake/Output Summary (Last 24 hours) at 10/18/2020 0946 Last data filed at 10/18/2020 0535 Gross per 24 hour  Intake 1986.08 ml  Output 1650 ml  Net 336.08 ml   Weight change:   Physical Exam: Gen:nad CVS:s1s2, rrr Resp:cta bl ATF:TDDU, nt/nd KGU:RKYH to touch, no sig edema Neuro: no asterixis, speech clear and coherent, moves all extremities spontaneously  Imaging: US RENAL  Result Date: 10/17/2020 CLINICAL DATA:  Acute kidney injury EXAM: RENAL / URINARY TRACT ULTRASOUND COMPLETE COMPARISON:  None. FINDINGS: Right Kidney: Renal measurements: 11.8 x 5.6 x 5.7 cm = volume: 197.9 mL. Echogenicity and renal cortical thickness are within normal limits. No mass, perinephric fluid, or hydronephrosis visualized. No sonographically demonstrable calculus or ureterectasis. Left Kidney: Renal measurements: 10.7 x 4.7 x 4.1 cm = volume: 109.5 mL. Echogenicity and renal cortical thickness are within normal limits. No perinephric fluid or hydronephrosis visualized. There is a cyst arising from the lower pole left kidney measuring 1.8 x 1.4 x 1.5 cm. No sonographically demonstrable calculus or ureterectasis. Bladder: Appears normal for degree of bladder distention. Other: None. IMPRESSION: Cyst arising from lower pole left kidney measuring 1.8 x 1.4 x 1.5 cm. Study otherwise unremarkable. Electronically Signed   By: Lowella Grip III M.D.   On: 10/17/2020 15:28   PERIPHERAL VASCULAR CATHETERIZATION  Result Date: 10/16/2020 Successful intravascular lithotripsy, PTCA and stenting     7.0X39 mm balloon expandable Viabahn VBX  stent Rt common iliac artery     7.0X60 mm self expanding Absolute Pro stent Rt distal iliac artery No named vessels at ankle level in both lower extremities. Will consult vascular  surgery for Rt fem-to-left profunda bypass given left foot critical limb ischemia. Nigel Mormon, MD Pager: [phone] Office: [phone]   VAS US CAROTID  Result Date: 10/17/2020 Carotid Arterial Duplex Study Indications:  Pre-surgical evaluation. Risk Factors: Hypertension, hyperlipidemia, current smoker, coronary artery               disease, PAD. Performing Technologist: Maudry Mayhew MHA, RDMS, RVT, RDCS  Examination Guidelines: A complete evaluation includes B-mode imaging, spectral Doppler, color Doppler, and power Doppler as needed of all accessible portions of each vessel. Bilateral testing is considered an integral part of a complete examination. Limited examinations for reoccurring indications may be performed as noted.  Right Carotid Findings: +----------+--------+--------+--------+-----------------------+--------+           PSV cm/sEDV cm/sStenosisPlaque Description     Comments +----------+--------+--------+--------+-----------------------+--------+ CCA Prox  186                     smooth and heterogenous         +----------+--------+--------+--------+-----------------------+--------+ CCA Distal87      16              smooth and heterogenous         +----------+--------+--------+--------+-----------------------+--------+ ICA Prox  56      13              smooth and heterogenous         +----------+--------+--------+--------+-----------------------+--------+ ICA Distal70      15                                              +----------+--------+--------+--------+-----------------------+--------+ ECA       139     7               smooth and heterogenous         +----------+--------+--------+--------+-----------------------+--------+ +----------+--------+-------+----------------+-------------------+           PSV cm/sEDV cmsDescribe        Arm Pressure (mmHG) +----------+--------+-------+----------------+-------------------+  [id number]            Multiphasic, WNL                    +----------+--------+-------+----------------+-------------------+ +---------+--------+--+--------+--+---------+ VertebralPSV cm/s78EDV cm/s29Antegrade +---------+--------+--+--------+--+---------+  Left Carotid Findings: +----------+--------+--------+--------+-----------------------+--------+           PSV cm/sEDV cm/sStenosisPlaque Description     Comments +----------+--------+--------+--------+-----------------------+--------+ CCA Prox  207     10                                              +----------+--------+--------+--------+-----------------------+--------+ CCA [id number]     14              smooth and heterogenous         +----------+--------+--------+--------+-----------------------+--------+ ICA Prox  81      17              smooth and heterogenous         +----------+--------+--------+--------+-----------------------+--------+ ICA Distal109     29                                              +----------+--------+--------+--------+-----------------------+--------+  ECA       240     14              smooth and heterogenous         +----------+--------+--------+--------+-----------------------+--------+ +----------+--------+--------+--------+-------------------+           PSV cm/sEDV cm/sDescribeArm Pressure (mmHG) +----------+--------+--------+--------+-------------------+ Subclavian310                                         +----------+--------+--------+--------+-------------------+ +---------+--------+---+--------+--+---------------------------------+ VertebralPSV cm/s293EDV cm/s47>50% cervical stenosis, antegrade +---------+--------+---+--------+--+---------------------------------+   Summary: Right Carotid: Velocities in the right ICA are consistent with a 1-39% stenosis. Left Carotid: Velocities in the left ICA are consistent with a 1-39% stenosis. Vertebrals:   Bilateral vertebral arteries demonstrate antegrade flow. Left >50%              cervical stenosis. Subclavians: Normal flow hemodynamics were seen in bilateral subclavian              arteries. *See table(s) above for measurements and observations.  Electronically signed by Harold Barban MD on 10/17/2020 at 7:38:49 PM.    Final     Labs: BMET Recent Labs  Lab 10/17/20 0825 10/17/20 1413 10/18/20 0722  NA 130* 132* 133*  K 4.1 4.2 3.7  CL 101 101 105  CO2 16* 16* 16*  GLUCOSE 120* 121* 92  BUN 77* 74* 68*  CREATININE 4.92* 5.08* 4.75*  CALCIUM 8.7* 8.5* 8.2*   CBC Recent Labs  Lab 10/16/20 0959 10/17/20 0825  WBC 15.5* 11.0*  HGB 14.8 12.7*  HCT 45.4 38.9*  MCV 86.6 84.9  PLT 249 221    Medications:    . aspirin EC  81 mg Oral Daily  . furosemide  20 mg Oral Daily  . ivabradine  5 mg Oral BID WC  . nicotine  7 mg Transdermal Q24H  . rosuvastatin  40 mg Oral Daily  . sodium bicarbonate  1,300 mg Oral TID  . sodium chloride flush  3 mL Intravenous Q12H  . ticagrelor  90 mg Oral BID      Gean Quint, MD [facility] 10/18/2020, 9:46 AM

## 2020-10-18 NOTE — Progress Notes (Signed)
Responded to referral from Sallyanne Kuster to notarize an AD. For patient in room 2c05.  Document was completed and notarized.  Jaclynn Major, [place], [facility], Pager [phone]

## 2020-10-18 NOTE — Progress Notes (Signed)
Explained and discussed discharge instructions to pt., pt aware of follow up appt. Sister waiting for her. Pt went home with belongings with sister via w/c

## 2020-10-18 NOTE — Plan of Care (Signed)
  Problem: Education: Goal: Knowledge of General Education information will improve Description: Including pain rating scale, medication(s)/side effects and non-pharmacologic comfort measures 10/18/2020 1108 by Don Perking, RN Outcome: Completed/Met 10/18/2020 0916 by Don Perking, RN Outcome: Progressing   Problem: Health Behavior/Discharge Planning: Goal: Ability to manage health-related needs will improve 10/18/2020 1108 by Don Perking, RN Outcome: Completed/Met 10/18/2020 0916 by Don Perking, RN Outcome: Progressing   Problem: Clinical Measurements: Goal: Ability to maintain clinical measurements within normal limits will improve 10/18/2020 1108 by Don Perking, RN Outcome: Completed/Met 10/18/2020 0916 by Don Perking, RN Outcome: Progressing Goal: Will remain free from infection 10/18/2020 1108 by Don Perking, RN Outcome: Completed/Met 10/18/2020 0916 by Don Perking, RN Outcome: Progressing Goal: Diagnostic test results will improve 10/18/2020 1108 by Don Perking, RN Outcome: Completed/Met 10/18/2020 0916 by Don Perking, RN Outcome: Progressing Goal: Respiratory complications will improve 10/18/2020 1108 by Don Perking, RN Outcome: Completed/Met 10/18/2020 0916 by Don Perking, RN Outcome: Progressing Goal: Cardiovascular complication will be avoided 10/18/2020 1108 by Don Perking, RN Outcome: Completed/Met 10/18/2020 0916 by Don Perking, RN Outcome: Progressing   Problem: Activity: Goal: Risk for activity intolerance will decrease 10/18/2020 1108 by Don Perking, RN Outcome: Completed/Met 10/18/2020 0916 by Don Perking, RN Outcome: Progressing   Problem: Nutrition: Goal: Adequate nutrition will be maintained 10/18/2020 1108 by Don Perking, RN Outcome: Completed/Met 10/18/2020 0916 by Don Perking, RN Outcome: Progressing   Problem:  Coping: Goal: Level of anxiety will decrease 10/18/2020 1108 by Don Perking, RN Outcome: Completed/Met 10/18/2020 0916 by Don Perking, RN Outcome: Progressing   Problem: Elimination: Goal: Will not experience complications related to bowel motility 10/18/2020 1108 by Don Perking, RN Outcome: Completed/Met 10/18/2020 0916 by Don Perking, RN Outcome: Progressing Goal: Will not experience complications related to urinary retention 10/18/2020 1108 by Don Perking, RN Outcome: Completed/Met 10/18/2020 0916 by Don Perking, RN Outcome: Progressing   Problem: Pain Managment: Goal: General experience of comfort will improve 10/18/2020 1108 by Don Perking, RN Outcome: Completed/Met 10/18/2020 0916 by Don Perking, RN Outcome: Progressing   Problem: Safety: Goal: Ability to remain free from injury will improve 10/18/2020 1108 by Don Perking, RN Outcome: Completed/Met 10/18/2020 0916 by Don Perking, RN Outcome: Progressing   Problem: Skin Integrity: Goal: Risk for impaired skin integrity will decrease 10/18/2020 1108 by Don Perking, RN Outcome: Completed/Met 10/18/2020 0916 by Don Perking, RN Outcome: Progressing

## 2020-10-18 NOTE — Evaluation (Signed)
Physical Therapy Evaluation and Discharge Patient Details Name: Mike [name] MRN: [id number] DOB: September 28, 1961 Today's Date: 10/18/2020   History of Present Illness  Pt is a 59 y/o male admitted secondary to critical limb ischemia (L>R) and AKI. Pt is s/p R iliac stent. Eventual will require LE bypass surgery. PMH includes CAD s/p stent, PAD.   Clinical Impression  Patient evaluated by Physical Therapy with no further acute PT needs identified. All education has been completed and the patient has no further questions. Pt with mildly antalgic gait because of BLE pain (LLE>RLE). No overt LOB noted throughout. Overall requiring supervision for safety. Reports sister can check in on him as needed. Reports he feels he is at his baseline. See below for any follow-up Physical Therapy or equipment needs. PT is signing off. Thank you for this referral. IF needs change, please re-consult.      Follow Up Recommendations No PT follow up    Equipment Recommendations  None recommended by PT    Recommendations for Other Services       Precautions / Restrictions Precautions Precautions: None Restrictions Weight Bearing Restrictions: No      Mobility  Bed Mobility               General bed mobility comments: In chair upon entry     Transfers Overall transfer level: Modified independent                  Ambulation/Gait Ambulation/Gait assistance: Supervision Gait Distance (Feet): 125 Feet Assistive device: None Gait Pattern/deviations: Step-through pattern;Decreased weight shift to left;Antalgic Gait velocity: mildly slower   General Gait Details: Mildly antalgic gait secondary to LLE pain, but did not seem to limit ambulation. overall steady with no overt LOB noted.   Stairs            Wheelchair Mobility    Modified Rankin (Stroke Patients Only)       Balance Overall balance assessment: No apparent balance deficits (not formally assessed)                                            Pertinent Vitals/Pain Pain Assessment: 0-10 Pain Score: 6  Pain Location: bilateral LEs Pain Descriptors / Indicators: Grimacing;Guarding Pain Intervention(s): Monitored during session;Limited activity within patient's tolerance;Repositioned    Home Living Family/patient expects to be discharged to:: Private residence Living Arrangements: Alone Available Help at Discharge: Family;Available PRN/intermittently Type of Home: Apartment Home Access: Level entry     Home Layout: One level Home Equipment: None      Prior Function Level of Independence: Independent               Hand Dominance        Extremity/Trunk Assessment   Upper Extremity Assessment Upper Extremity Assessment: Defer to OT evaluation    Lower Extremity Assessment Lower Extremity Assessment: RLE deficits/detail;LLE deficits/detail RLE Deficits / Details: Increased pain throughout RLE. Reports LLE is worse than RLE.  LLE Deficits / Details: Increased pain throughout RLE. Reports LLE is worse than RLE.     Cervical / Trunk Assessment Cervical / Trunk Assessment: Normal  Communication   Communication: No difficulties  Cognition Arousal/Alertness: Awake/alert Behavior During Therapy: WFL for tasks assessed/performed Overall Cognitive Status: Within Functional Limits for tasks assessed  General Comments      Exercises     Assessment/Plan    PT Assessment Patent does not need any further PT services  PT Problem List         PT Treatment Interventions      PT Goals (Current goals can be found in the Care Plan section)  Acute Rehab PT Goals Patient Stated Goal: to go home PT Goal Formulation: With patient Time For Goal Achievement: 10/18/20 Potential to Achieve Goals: Good    Frequency     Barriers to discharge        Co-evaluation               AM-PAC PT "6 Clicks"  Mobility  Outcome Measure Help needed turning from your back to your side while in a flat bed without using bedrails?: None Help needed moving from lying on your back to sitting on the side of a flat bed without using bedrails?: None Help needed moving to and from a bed to a chair (including a wheelchair)?: None Help needed standing up from a chair using your arms (e.g., wheelchair or bedside chair)?: None Help needed to walk in hospital room?: None Help needed climbing 3-5 steps with a railing? : A Little 6 Click Score: 23    End of Session Equipment Utilized During Treatment: Gait belt Activity Tolerance: Patient tolerated treatment well Patient left: in chair;with call bell/phone within reach Nurse Communication: Mobility status PT Visit Diagnosis: Pain Pain - Right/Left:  (bilateral) Pain - part of body: Leg    Time: 3817-7116 PT Time Calculation (min) (ACUTE ONLY): 14 min   Charges:   PT Evaluation $PT Eval Low Complexity: 1 Low          Lou Miner, DPT  Acute Rehabilitation Services  Pager: [phone] Office: [phone]   Rudean Hitt 10/18/2020, 11:57 AM

## 2020-10-18 NOTE — Telephone Encounter (Signed)
Location of hospitalization:  Reason for hospitalization: Pt had a procedure (bypass) did not go well due to blockage. Has been reschedule for next week. Date of discharge: 10/18/2020 Date of first communication with patient: today Person contacting patient: Me Current symptoms: none Do you understand why you were in the Hospital: Yes Questions regarding discharge instructions: None Where were you discharged to: Home Medications reviewed: Yes Allergies reviewed: Yes Dietary changes reviewed: Yes. Discussed low fat and low salt diet.  Referals reviewed: NA Activities of Daily Living: Able to with mild limitations Any transportation issues/concerns: None Any patient concerns: None Confirmed importance & date/time of Follow up appt: Yes Confirmed with patient if condition begins to worsen call. Pt was given the office number and encouraged to call back with questions or concerns: Yes

## 2020-10-18 NOTE — Discharge Summary (Signed)
Physician Discharge Summary  Patient ID: Mike [name] MRN: [id number] DOB/AGE: March 08, 1961 59 y.o.  Admit date: 10/16/2020 Discharge date: 10/18/2020  Primary Discharge Diagnosis: PAD Critical limb ischemia Acute kidney injury  Secondary Discharge Diagnosis: CAD HFrEF H/o cardiac arrest   Hospital Course:   59 year old Caucasian male with CAD, culprit (RCA) and nonculprit (LCx) PCI after STEMI 07/2020, HFrEF, moderate MR, h/o cardiac arrest 2/2 Torsades post PPCI during index hospitalization, PAD with critical limb ischemia  Detailed angiogram and intervention report below.  Briefly, he has severe PAD in both lower extremities with no target for endovascular revascularization in left lower extremity.  We felt his best option was to revascularize right, and external iliac artery to improve flow in his femoral artery, and then consider right femoral to left profunda bypass surgery.  Patient underwent intravascular lithotripsy and successful stenting of right common iliac and external iliac artery.  Unfortunately, he developed acute kidney injury owing to combination of hypertension, HFrEF, contrast use, and bilateral renal artery stenosis.  This improved over the next 48 hours.  Creatinine is down from 5.08-4.75, with excellent urine output.  He will be discharged home with close follow-up and repeat BMP.  Hold metoprolol, spironolactone, Entresto, and Corlanor on discharge.  Take Lasix only as needed.  Use Percocet no later than 2-3 pills/day, as needed.  I discussed with Dr. Trula Slade regarding surgical options for right to left femorofemoral bypass surgery.  His perioperative cardiac risk remains elevated owing to his underlying issues.  We will discuss further during upcoming office visit.  Discharge Exam: Blood pressure (!) 93/55, pulse (!) 53, temperature 98.3 F (36.8 C), temperature source Oral, resp. rate 16, height 6' (1.829 m), weight 69.9 kg, SpO2 98 %.   Physical  Exam Vitals and nursing note reviewed.  Constitutional:      General: He is not in acute distress.    Appearance: He is well-developed.  HENT:     Head: Normocephalic and atraumatic.  Eyes:     Conjunctiva/sclera: Conjunctivae normal.     Pupils: Pupils are equal, round, and reactive to light.  Neck:     Vascular: No JVD.  Cardiovascular:     Rate and Rhythm: Normal rate and regular rhythm.     Pulses: Intact distal pulses.          Femoral pulses are 2+ on the right side and 1+ on the left side.      Popliteal pulses are 1+ on the right side and 0 on the left side.       Dorsalis pedis pulses are 1+ on the right side and 0 on the left side.       Posterior tibial pulses are 0 on the right side and 0 on the left side.     Heart sounds: No murmur heard.      Comments: Area of dark discoloration on second toe left foot Pulmonary:     Effort: Pulmonary effort is normal.     Breath sounds: Normal breath sounds. No wheezing or rales.  Abdominal:     General: Bowel sounds are normal.     Palpations: Abdomen is soft.     Tenderness: There is no rebound.  Musculoskeletal:        General: No tenderness. Normal range of motion.     Left lower leg: No edema.  Lymphadenopathy:     Cervical: No cervical adenopathy.  Skin:    General: Skin is warm and dry.  Neurological:  Mental Status: He is alert and oriented to person, place, and time.     Cranial Nerves: No cranial nerve deficit.      Cardiovascular Studies:  PV intervention 10/16/2020: Successful intravascular lithotripsy, PTCA and stenting 7.0X39 mm balloon expandable Viabahn VBX stent Rt common iliac artery  7.0X60 mm self expanding Absolute Pro stent Rt distal iliac artery  No named vessels at ankle level in both lower extremities.  Will consult vascular surgery for Rt fem-to-left profunda bypass given left foot critical limb ischemia.  Abdominal Aortic Duplex 09/21/2020:  Moderate plaque noted in the  proximal, mid and distal aorta. There is an  ulcerated plaque noted in the distal abdominal aorta. No AAA. Normal iliac  artery velocity.  Lower Extremity Arterial Duplex 09/21/2020:  Theright SFA is occluded in the proximal segment with reconstitution at  the level of the popliteal artery with diffuse monophasic waveform below  the knee. Right profunda femoral artery has >50% stenosis. There is  moderate mixed plaque noted throughout the right lower extremity.  Monophasic waveform throughout the left lower extremity, indicates  significant proximal disease (iliac artery).  Left SFA is occluded in the proximal segment and reconstitutes just above popliteal artery and diffuse dampened monophasic waveform throughout the lower extremity below the  knee. There is moderate mixed plaque throughout the left lower extremity.   This exam reveals severely decreased perfusion of the right lower  extremity, noted at the dorsalis pedis artery level (ABI 0.42) and  critically decreased perfusion of the left lower extremity, noted at the  dorsalis pedis and post tibial artery level (ABI 0.03).   Echocardiogram 09/11/2020:  Left ventricle cavity is moderately dilated. Moderate concentric  hypertrophy of the left ventricle. Moderate global and severe  inferolateral hypokinesis. LVEF 15-20%. Grade 1 diastolic dysfunction.  Normal left atrial pressure. Calculated EF 15%.  Mild to moderate mitral regurgitation.  Inadequate TR jet to estimate pulmonary artery systolic pressure. Normal  right atrial pressure.  Compared to previous study in 03/2020, LVEF is reduced from 30-35%. Mitral  regurgitation is marginally improved.   EKG 08/14/2020: Sinus rhythm 81 bpm Old inferior and anterior infarct  Coronary intervention 08/03/2020: LM: Distal 40% stenosis LAD:Mid 30% disease LCx: Subtotally occluded OM2, prox LCx 70% stenosis Successful percutaneous coronary intervention OM2-Prox  LCx PTCA and overlapping stents placement  2.5 X 38 mm and 2.5 X 18 mm Resolute Onyx drug-eluting stents 100%--->0% stenosis. TIMI flow 0-->III Small caliber distal vessel with moderate diffuse disease RCA: Prox 30% stenosis. Patent mid RCA sttent 2.5 X 26 mm Resolute Onyx drug-eluting stent  LVEDP 42 mmHg  Echocardiogram 08/02/2020: 1. Moderate global and severe inferolateral hypokinesis. Left ventricular  ejection fraction, by estimation, is 30 to 35%. The left ventricle has  normal function. The left ventricle demonstrates regional wall motion  abnormalities (see scoring  diagram/findings for description). There is mild left ventricular  hypertrophy. Left ventricular diastolic parameters are consistent with  Grade II diastolic dysfunction (pseudonormalization).  2. Right ventricular systolic function is low normal. The right  ventricular size is normal.  3. Left atrial size was mildly dilated.  4. The mitral valve is grossly normal. Moderate mitral valve  regurgitation.  5. The aortic valve is tricuspid. Aortic valve regurgitation is not  visualized. Mild aortic valve sclerosis is present, with no evidence of  aortic valve stenosis.  6. The inferior vena cava is dilated in size with >50% respiratory  variability, suggesting right atrial pressure of 8 mmHg.   Coronary intervention  08/01/2020: LM: Distal 30% stenosis LAD:Mid 30% disease LCx: Subtotally occluded OM2, bridging left-to-left and right-to-left collaterals RCA: Prox 30% stenosis. Mid 100% occlusion Successful percutaneous coronary intervention mid RCA PTCA and stent placement 2.5 X 26 mm Resolute Onyx drug-eluting stent 100%--->0% stenosis. TIMI flow 0-->III  Labs:   Lab Results  Component Value Date   WBC 11.0 (H) 10/17/2020   HGB 12.7 (L) 10/17/2020   HCT 38.9 (L) 10/17/2020   MCV 84.9 10/17/2020   PLT 221 10/17/2020    Recent Labs  Lab  10/18/20 0722  NA 133*  K 3.7  CL 105  CO2 16*  BUN 68*  CREATININE 4.75*  CALCIUM 8.2*  GLUCOSE 92    Lipid Panel     Component Value Date/Time   CHOL 95 (L) 09/11/2020 0806   TRIG 76 09/11/2020 0806   HDL 30 (L) 09/11/2020 0806   CHOLHDL 3.2 09/11/2020 0806   CHOLHDL 4.5 08/02/2020 0707   VLDL 15 08/02/2020 0707   LDLCALC 49 09/11/2020 0806    BNP (last 3 results) Recent Labs    08/03/20 0644  BNP 751.4*    HEMOGLOBIN A1C Lab Results  Component Value Date   HGBA1C 5.8 (H) 08/02/2020   MPG 119.76 08/02/2020    Cardiac Panel (last 3 results) Recent Labs    10/17/20 1413  CKTOTAL 486*    Lab Results  Component Value Date   CKTOTAL 486 (H) 10/17/2020     TSH No results for input(s): TSH in the last 8760 hours.  Radiology: US RENAL  Result Date: 10/17/2020 CLINICAL DATA:  Acute kidney injury EXAM: RENAL / URINARY TRACT ULTRASOUND COMPLETE COMPARISON:  None. FINDINGS: Right Kidney: Renal measurements: 11.8 x 5.6 x 5.7 cm = volume: 197.9 mL. Echogenicity and renal cortical thickness are within normal limits. No mass, perinephric fluid, or hydronephrosis visualized. No sonographically demonstrable calculus or ureterectasis. Left Kidney: Renal measurements: 10.7 x 4.7 x 4.1 cm = volume: 109.5 mL. Echogenicity and renal cortical thickness are within normal limits. No perinephric fluid or hydronephrosis visualized. There is a cyst arising from the lower pole left kidney measuring 1.8 x 1.4 x 1.5 cm. No sonographically demonstrable calculus or ureterectasis. Bladder: Appears normal for degree of bladder distention. Other: None. IMPRESSION: Cyst arising from lower pole left kidney measuring 1.8 x 1.4 x 1.5 cm. Study otherwise unremarkable. Electronically Signed   By: Lowella Grip III M.D.   On: 10/17/2020 15:28   PERIPHERAL VASCULAR CATHETERIZATION  Result Date: 10/16/2020 Successful intravascular lithotripsy, PTCA and stenting     7.0X39 mm balloon expandable  Viabahn VBX stent Rt common iliac artery     7.0X60 mm self expanding Absolute Pro stent Rt distal iliac artery No named vessels at ankle level in both lower extremities. Will consult vascular surgery for Rt fem-to-left profunda bypass given left foot critical limb ischemia. Nigel Mormon, MD Pager: [phone] Office: [phone]   VAS US CAROTID  Result Date: 10/17/2020 Carotid Arterial Duplex Study Indications:  Pre-surgical evaluation. Risk Factors: Hypertension, hyperlipidemia, current smoker, coronary artery               disease, PAD. Performing Technologist: Maudry Mayhew MHA, RDMS, RVT, RDCS  Examination Guidelines: A complete evaluation includes B-mode imaging, spectral Doppler, color Doppler, and power Doppler as needed of all accessible portions of each vessel. Bilateral testing is considered an integral part of a complete examination. Limited examinations for reoccurring indications may be performed as noted.  Right Carotid Findings: +----------+--------+--------+--------+-----------------------+--------+  PSV cm/sEDV cm/sStenosisPlaque Description     Comments +----------+--------+--------+--------+-----------------------+--------+ CCA Prox  186                     smooth and heterogenous         +----------+--------+--------+--------+-----------------------+--------+ CCA Distal87      16              smooth and heterogenous         +----------+--------+--------+--------+-----------------------+--------+ ICA Prox  56      13              smooth and heterogenous         +----------+--------+--------+--------+-----------------------+--------+ ICA Distal70      15                                              +----------+--------+--------+--------+-----------------------+--------+ ECA       139     7               smooth and heterogenous         +----------+--------+--------+--------+-----------------------+--------+  +----------+--------+-------+----------------+-------------------+           PSV cm/sEDV cmsDescribe        Arm Pressure (mmHG) +----------+--------+-------+----------------+-------------------+ [id number]            Multiphasic, WNL                    +----------+--------+-------+----------------+-------------------+ +---------+--------+--+--------+--+---------+ VertebralPSV cm/s78EDV cm/s29Antegrade +---------+--------+--+--------+--+---------+  Left Carotid Findings: +----------+--------+--------+--------+-----------------------+--------+           PSV cm/sEDV cm/sStenosisPlaque Description     Comments +----------+--------+--------+--------+-----------------------+--------+ CCA Prox  207     10                                              +----------+--------+--------+--------+-----------------------+--------+ CCA [id number]     14              smooth and heterogenous         +----------+--------+--------+--------+-----------------------+--------+ ICA Prox  81      17              smooth and heterogenous         +----------+--------+--------+--------+-----------------------+--------+ ICA Distal109     29                                              +----------+--------+--------+--------+-----------------------+--------+ ECA       240     14              smooth and heterogenous         +----------+--------+--------+--------+-----------------------+--------+ +----------+--------+--------+--------+-------------------+           PSV cm/sEDV cm/sDescribeArm Pressure (mmHG) +----------+--------+--------+--------+-------------------+ Subclavian310                                         +----------+--------+--------+--------+-------------------+ +---------+--------+---+--------+--+---------------------------------+ VertebralPSV cm/s293EDV cm/s47>50% cervical stenosis, antegrade  +---------+--------+---+--------+--+---------------------------------+   Summary: Right Carotid: Velocities in the right ICA are consistent with a 1-39% stenosis. Left Carotid: Velocities in the left  ICA are consistent with a 1-39% stenosis. Vertebrals:  Bilateral vertebral arteries demonstrate antegrade flow. Left >50%              cervical stenosis. Subclavians: Normal flow hemodynamics were seen in bilateral subclavian              arteries. *See table(s) above for measurements and observations.  Electronically signed by Harold Barban MD on 10/17/2020 at 7:38:49 PM.    Final    PCV LOWER ARTERIAL (BILATERAL)  Result Date: 09/22/2020 Lower Extremity Arterial Duplex 09/21/2020: The right SFA is occluded in the proximal segment with reconstitution at the level of the popliteal artery with diffuse monophasic waveform below the knee. Right profunda femoral artery has >50% stenosis. There is moderate mixed plaque noted throughout the right lower extremity. Monophasic waveform throughout the left lower extremity, indicates significant proximal disease (iliac artery). Left SFA is occluded in the proximal segment and reconstitutes just above popliteal artery and diffuse dampened monophasic waveform throughout the lower extremity below the knee. There is moderate mixed plaque throughout the left lower extremity. This exam reveals severely decreased perfusion of the right lower extremity, noted at the dorsalis pedis artery level (ABI 0.42) and critically decreased perfusion of the left lower extremity, noted at the dorsalis pedis and post tibial artery level (ABI 0.03).   PCV AORTA DUPLEX  Result Date: 09/22/2020 Abdominal Aortic Duplex  09/21/2020: Moderate plaque noted in the proximal, mid and distal aorta. There is an ulcerated plaque noted in the distal abdominal aorta. No AAA. Normal iliac artery velocity.     FOLLOW UP PLANS AND APPOINTMENTS Discharge Instructions    Diet - low sodium heart healthy    Complete by: As directed    Increase activity slowly   Complete by: As directed      Allergies as of 10/18/2020   No Known Allergies     Medication List    STOP taking these medications   Entresto 49-51 MG Generic drug: sacubitril-valsartan   ivabradine 5 MG Tabs tablet Commonly known as: Corlanor   metoprolol succinate 25 MG 24 hr tablet Commonly known as: TOPROL-XL   spironolactone 50 MG tablet Commonly known as: ALDACTONE     TAKE these medications   aspirin 81 MG EC tablet Take 1 tablet (81 mg total) by mouth daily. Swallow whole.   furosemide 20 MG tablet Commonly known as: LASIX Take 1 tablet (20 mg total) by mouth daily as needed. What changed:   when to take this  reasons to take this   nicotine 7 mg/24hr patch Commonly known as: NICODERM CQ - dosed in mg/24 hr Place 1 patch (7 mg total) onto the skin daily.   nitroGLYCERIN 0.4 MG SL tablet Commonly known as: NITROSTAT Place 1 tablet (0.4 mg total) under the tongue every 5 (five) minutes as needed for chest pain.   oxyCODONE-acetaminophen 5-325 MG tablet Commonly known as: Percocet Take 1 tablet by mouth every 8 (eight) hours as needed for severe pain. What changed: when to take this   rosuvastatin 40 MG tablet Commonly known as: CRESTOR Take 1 tablet (40 mg total) by mouth daily.   ticagrelor 90 MG Tabs tablet Commonly known as: BRILINTA Take 1 tablet (90 mg total) by mouth 2 (two) times daily.       Follow-up Information    Nigel Mormon, MD Follow up on 10/22/2020.   Specialties: Cardiology, Radiology Why: 9:00 AM Contact information: [address] [place] [address]  [phone]                 Nigel Mormon, MD Pager: [phone] Office: [phone]

## 2020-10-18 NOTE — Progress Notes (Signed)
Advance directive given to pt., chaplain called, will come over later to notarize advance directive. Pt called family made aware of d/c

## 2020-10-19 NOTE — Progress Notes (Addendum)
Follow up visit  Subjective:   Mike [name], male    DOB: [birth year], 59 y.o.   MRN: [id number]     HPI   Chief Complaint  Patient presents with  . Severe claudication ([place])  . HFrEF  . Follow-up    59 year old Caucasian male with CAD, culprit (RCA) and nonculprit (LCx) PCI after STEMI 07/2020, HFrEF, moderate MR, h/o cardiac arrest 2/2 Torsades post PPCI during index hospitalization, PAD with critical limb ischemia  Detailed angiogram and intervention report below.  Briefly, he has severe PAD in both lower extremities with no obvious target for endovascular revascularization in left lower extremity.  We felt his best option was to revascularize right, and external iliac artery to improve flow in his femoral artery, and then consider right femoral to left profunda bypass surgery.  Patient underwent intravascular lithotripsy and successful stenting of right common iliac and external iliac artery.  Unfortunately, he developed acute kidney injury owing to combination of hypertension, HFrEF, contrast use, and bilateral renal artery stenosis.  This improved over the next 48 hours.  Creatinine was down from 5.08-4.75 on the day of discharge, with excellent urine output.  Metoprolol, spironolactone, Entresto, and Corlanor were held on discharge, owing to renal dysfunction,. Hypotension.  I discussed with Dr. Trula Slade regarding surgical options for right to left femorofemoral bypass surgery.  While this is doable, we both felt that his perioperative cardiac risk was very high.  Patient is here for follow-up with his sister [employer].  Patient has been in significant pain in his left lower extremity since hospital discharge.  He has noticed improvement in claudication in his right lower extremity.  He has had noticed swelling in both lower extremities.  He has dyspnea with minimal exertion, such as doing laundry.  He has not had any chest pain similar to what he experienced at the time of  STEMI in August 2021.  He experiences mild sharp chest pain when he has to breathe hard.  Current Outpatient Medications on File Prior to Visit  Medication Sig Dispense Refill  . aspirin 81 MG EC tablet Take 1 tablet (81 mg total) by mouth daily. Swallow whole. 90 tablet 3  . furosemide (LASIX) 20 MG tablet Take 1 tablet (20 mg total) by mouth daily as needed. 30 tablet 2  . nicotine (NICODERM CQ - DOSED IN MG/24 HR) 7 mg/24hr patch Place 1 patch (7 mg total) onto the skin daily. 30 patch 1  . nitroGLYCERIN (NITROSTAT) 0.4 MG SL tablet Place 1 tablet (0.4 mg total) under the tongue every 5 (five) minutes as needed for chest pain. 30 tablet 1  . oxyCODONE-acetaminophen (PERCOCET) 5-325 MG tablet Take 1 tablet by mouth every 8 (eight) hours as needed for severe pain. 60 tablet 0  . rosuvastatin (CRESTOR) 40 MG tablet Take 1 tablet (40 mg total) by mouth daily. 90 tablet 3  . ticagrelor (BRILINTA) 90 MG TABS tablet Take 1 tablet (90 mg total) by mouth 2 (two) times daily. 180 tablet 3   No current facility-administered medications on file prior to visit.    Cardiovascular & other pertient studies:  EKG 10/22/2020: Sinus tachycardia 103 bpm Old inferior infarct Persistent inferior ST elevations since MI in 07/2020 may represent aneurysmal changes.  PV intervention 10/16/2020: Successful intravascular lithotripsy, PTCA and stenting 7.0X39 mm balloon expandable Viabahn VBX stent Rt common iliac artery  7.0X60 mm self expanding Absolute Pro stent Rt distal iliac artery  No named vessels at ankle level in  both lower extremities.  Will consult vascular surgery for Rt fem-to-left profunda bypass given left foot critical limb ischemia.  Abdominal Aortic Duplex 09/21/2020:  Moderate plaque noted in the proximal, mid and distal aorta. There is an  ulcerated plaque noted in the distal abdominal aorta. No AAA. Normal iliac  artery velocity.   Abdominal Aortic Duplex 09/21/2020:   Moderate plaque noted in the proximal, mid and distal aorta. There is an  ulcerated plaque noted in the distal abdominal aorta. No AAA. Normal iliac  artery velocity.  Lower Extremity Arterial Duplex 09/21/2020:  The right SFA is occluded in the proximal segment with reconstitution at  the level of the popliteal artery with diffuse monophasic waveform below  the knee. Right profunda femoral artery has >50% stenosis. There is  moderate mixed plaque noted throughout the right lower extremity.  Monophasic waveform throughout the left lower extremity, indicates  significant proximal disease (iliac artery).   Left SFA is occluded in the proximal segment and reconstitutes just above popliteal artery and diffuse dampened monophasic waveform throughout the lower extremity below the  knee. There is moderate mixed plaque throughout the left lower extremity.   This exam reveals severely decreased perfusion of the right lower  extremity, noted at the dorsalis pedis artery level (ABI 0.42) and  critically decreased perfusion of the left lower extremity, noted at the  dorsalis pedis and post tibial artery level (ABI 0.03).   Echocardiogram 09/11/2020:  Left ventricle cavity is moderately dilated. Moderate concentric  hypertrophy of the left ventricle. Moderate global and severe  inferolateral hypokinesis. LVEF 15-20%. Grade 1 diastolic dysfunction.  Normal left atrial pressure. Calculated EF 15%.  Mild to moderate mitral regurgitation.  Inadequate TR jet to estimate pulmonary artery systolic pressure. Normal  right atrial pressure.  Compared to previous study in 03/2020, LVEF is reduced from 30-35%. Mitral  regurgitation is marginally improved.   EKG 08/14/2020: Sinus rhythm 81 bpm Old inferior and anterior infarct  Coronary intervention 08/03/2020: LM: Distal 40% stenosis LAD: Mid 30% disease LCx: Subtotally occluded OM2, prox LCx 70% stenosis Successful percutaneous coronary  intervention OM2-Prox LCx PTCA and overlapping stents placement  2.5 X 38 mm and 2.5 X 18 mm Resolute Onyx drug-eluting stents 100%--->0% stenosis. TIMI flow 0-->III Small caliber distal vessel with moderate diffuse disease RCA: Prox 30% stenosis. Patent mid RCA sttent 2.5 X 26 mm Resolute Onyx drug-eluting stent  LVEDP 42 mmHg  Echocardiogram 08/02/2020: 1. Moderate global and severe inferolateral hypokinesis. Left ventricular  ejection fraction, by estimation, is 30 to 35%. The left ventricle has  normal function. The left ventricle demonstrates regional wall motion  abnormalities (see scoring  diagram/findings for description). There is mild left ventricular  hypertrophy. Left ventricular diastolic parameters are consistent with  Grade II diastolic dysfunction (pseudonormalization).  2. Right ventricular systolic function is low normal. The right  ventricular size is normal.  3. Left atrial size was mildly dilated.  4. The mitral valve is grossly normal. Moderate mitral valve  regurgitation.  5. The aortic valve is tricuspid. Aortic valve regurgitation is not  visualized. Mild aortic valve sclerosis is present, with no evidence of  aortic valve stenosis.  6. The inferior vena cava is dilated in size with >50% respiratory  variability, suggesting right atrial pressure of 8 mmHg.   Coronary intervention 08/01/2020: LM: Distal 30% stenosis LAD: Mid 30% disease LCx: Subtotally occluded OM2, bridging left-to-left and right-to-left collaterals RCA: Prox 30% stenosis. Mid 100% occlusion Successful percutaneous coronary intervention mid RCA  PTCA and stent placement 2.5 X 26 mm Resolute Onyx drug-eluting stent 100%--->0% stenosis. TIMI flow 0-->III   Recent labs: 09/25/2020: Glucose 102, BUN/Cr 18/1.16. EGFR 68. Na/K 140/4.5.  NT pro BNP 2404  09/11/2020: Glucose 114, BUN/Cr 19/1.42. EGFR 53. Na/K 136/4.7.  Chol  95, TG 76, HDL 30, LDL 49  08/04/2020: Glucose 101, BUN/Cr 19/1.08. EGFR >60. Na/K 139/4.0. Rest of the CMP normal H/H 14/42. MCV 89. Platelets 184. WBC count 14k HbA1C 5.8% Chol 178, TG 75, HDL 40, LDL 123    Review of Systems  Cardiovascular: Positive for claudication and dyspnea on exertion. Negative for chest pain, leg swelling, palpitations and syncope.  Respiratory: Positive for shortness of breath.          Vitals:   10/22/20 0912  BP: (!) 102/58  Pulse: (!) 106  Resp: 16  SpO2: 95%     Body mass index is 20.89 kg/m. Filed Weights   10/22/20 0912  Weight: 154 lb (69.9 kg)     Objective:   Physical Exam Vitals and nursing note reviewed.  Constitutional:      General: He is not in acute distress. Neck:     Vascular: No JVD.  Cardiovascular:     Rate and Rhythm: Normal rate and regular rhythm.     Pulses:          Femoral pulses are 1+ on the right side and 1+ on the left side.      Dorsalis pedis pulses are 1+ on the right side and 1+ on the left side.       Posterior tibial pulses are 0 on the right side and 0 on the left side.     Heart sounds: Normal heart sounds. No murmur heard.      Comments: 2 mm area of dark discoloration on 2nd toe left foot. Pulmonary:     Effort: Pulmonary effort is normal.     Breath sounds: Normal breath sounds. No wheezing or rales.           Assessment & Recommendations:   59 year old Caucasian male with CAD, culprit (RCA) and nonculprit (LCx) PCI after STEMI 07/2020, HFrEF, moderate MR, h/o cardiac arrest 2/2 Torsades post PPCI during index hospitalization, PAD w/successful Rt iliac revascularization (09/2020), residual severe below the knee disease RLE, un-revascularized LLE with critical limb ischemia, AKI/CKD  PAD: Successful revascularization of right common iliac and right external iliac artery. Un-revascularized severe PAD with critical limb ischemia left lower extremity. Given patient's severely CAD, HFrEF,  surgical risk for revascularization is very high. This includes continued management of his HFrEF with attempt at percutaneous revascularization in 2-3 weeks.  This will give patient a chance to recover from his acute kidney injury, and also give  a chance for endothelialization of recently placed stents in right iliac artery.  We will then consider revascularization of ostially occluded left common iliac artery.  I discussed potential risks with patient, including but not limited to, acute limb ischemia requiring emergency surgical revascularization and/or amputation, acute kidney injury, other vascular complications, as well as cardiac complications such as cardiac arrest, death.  Patient is severely limited with his physical activity, and also has resting leg pain and early gangrenous changes in left foot second toe.  Patient wants to proceed with any form of revascularization, if possible.  In the meantime, I have referred him to wound care center.  In spite of successful revascularization, there is high chance that patient may develop some degree of dry  gangrene and require amputation of toes or some part of his left lower extremity in near future. With successful revascularization, we can hopefully salvage as much left lower extremity as possible. We will use CO2 to reduce risk of acute kidney injury.  Continue Aspirin/Brilinta, as he is on this DAPT post coronary revascularization sine MI in 07/2020. Patient is currently on Percocet for pain control.  CAD: STEMI 07/2020.  Primary PCI to culprit vessel RCA. Resolute Onyx drug-eluting stent 2.5X26 mm DES Recurrent chest pain and shortness of breath requiring nonculprit PCI to OM2 during index hospitalization.  Successful percutaneous coronary intervention OM2-Prox LCx PTCA and overlapping stents placement  2.5 X 38 mm and 2.5 X 18 mm Resolute Onyx drug-eluting stents 100%--->0% stenosis. TIMI flow 0-->III Small caliber  distal vessel with moderate diffuse disease Residual moderate distal left main, prox RCA, and mild mid LAD disease.  No exertional angina to indicate an revascularized severe CAD to be the cause of his persistent low EF. Persistent inferior ST elevations in absence of chest pain since successful revascularization in 07/2020 likely due to aneurysmal changes.  Recommend DAPT with aspirin and Brilinta at least till 07/2021. Continue Crestor 40 mg. LDL significantly improved. Discussion regarding other medications, below.  HFrEF: EF 15-20% in spite of optimum revascularization.  I suspect he may have had reduced EF even prior to his STEMI, owing to his chronically occluded left circumflex, which is now revascularized. Acute decompensation today, owing to interruption of GDMT due to AKI. Resume corlanor 5 mg bid today.  Unable to use ARNI/ARB due to AKI.  Started bidil 20-37.5 mg 1/2 tab bid. Hope to resume other medications over the next few days. Repeat echocardiogram in 12/2020.   H/o cardiac arrest: Medication Torsades post PPCI during index hospitalization with no recurrence EF remains 10-15% 6 weeks post MI Seen by Dr, Caryl Comes. Awaiting up-titration of medical therapy, prior to decision re:ICD  Hyperlipidemia: LDL down from 123 to 49. Continue Crestor 40 mg.  Snoring, sinus pauses: Noted to have up to 4-second sinus pauses during sleep.  Suspect he may have obstructive sleep apnea.    Tobacco dependence: Re-emphasized tobacco cessation.   Outside a discussion with the patient regarding his CODE STATUS wishes.  He had initiated advanced directive paperwork while in the hospital.  He would like his Sister Kandyce Rud to be his healthcare power of attorney.  Patient also expressed wishes regarding not wanted to be resuscitated.  He states that this is largely due to his severe ongoing pain and states that "I cannot continue like this".  I have explained to the patient regarding the procedures  that we are hoping to improve his resting leg pain.  He agrees with maintaining full CODE STATUS in the periprocedural period.   Nigel Mormon, MD Pager: [phone] Office: [phone]

## 2020-10-22 ENCOUNTER — Other Ambulatory Visit: Payer: Self-pay

## 2020-10-22 ENCOUNTER — Ambulatory Visit: Payer: BC Managed Care – PPO | Admitting: Cardiology

## 2020-10-22 ENCOUNTER — Encounter: Payer: Self-pay | Admitting: Cardiology

## 2020-10-22 VITALS — BP 102/58 | HR 106 | Resp 16 | Ht 72.0 in | Wt 154.0 lb

## 2020-10-22 DIAGNOSIS — Z8674 Personal history of sudden cardiac arrest: Secondary | ICD-10-CM

## 2020-10-22 DIAGNOSIS — I70229 Atherosclerosis of native arteries of extremities with rest pain, unspecified extremity: Secondary | ICD-10-CM

## 2020-10-22 DIAGNOSIS — I2510 Atherosclerotic heart disease of native coronary artery without angina pectoris: Secondary | ICD-10-CM | POA: Diagnosis not present

## 2020-10-22 DIAGNOSIS — I5023 Acute on chronic systolic (congestive) heart failure: Secondary | ICD-10-CM

## 2020-10-22 DIAGNOSIS — I5020 Unspecified systolic (congestive) heart failure: Secondary | ICD-10-CM | POA: Diagnosis not present

## 2020-10-22 MED ORDER — BIDIL 20-37.5 MG PO TABS: 0.5000 | ORAL_TABLET | Freq: Three times a day (TID) | ORAL | 2 refills | Status: DC

## 2020-10-22 MED ORDER — IVABRADINE HCL 5 MG PO TABS: 5.0000 mg | ORAL_TABLET | Freq: Two times a day (BID) | ORAL | Status: DC

## 2020-10-23 LAB — BRAIN NATRIURETIC PEPTIDE: BNP: 261.5 pg/mL — ABNORMAL HIGH (ref 0.0–100.0)

## 2020-10-23 NOTE — Addendum Note (Signed)
Addended by: Nigel Mormon on: 10/23/2020 09:05 PM   Modules accepted: Orders

## 2020-10-24 ENCOUNTER — Other Ambulatory Visit: Payer: Self-pay | Admitting: Cardiology

## 2020-10-24 DIAGNOSIS — I739 Peripheral vascular disease, unspecified: Secondary | ICD-10-CM

## 2020-10-24 DIAGNOSIS — I255 Ischemic cardiomyopathy: Secondary | ICD-10-CM | POA: Insufficient documentation

## 2020-10-24 NOTE — Progress Notes (Signed)
Please check with me before you call patient., His ambulation is limited due to medical issues. I would rather check BMP next week instead.  Also, can you add Rt leg ABI on the day of his visit 11/1? I will add order.  Thanks MJP

## 2020-10-25 ENCOUNTER — Other Ambulatory Visit: Payer: Self-pay

## 2020-10-25 ENCOUNTER — Telehealth: Payer: BC Managed Care – PPO | Admitting: Internal Medicine

## 2020-10-25 DIAGNOSIS — I5020 Unspecified systolic (congestive) heart failure: Secondary | ICD-10-CM

## 2020-10-25 DIAGNOSIS — I255 Ischemic cardiomyopathy: Secondary | ICD-10-CM

## 2020-10-25 LAB — BASIC METABOLIC PANEL
BUN/Creatinine Ratio: 11 (ref 10–24)
BUN: 67 mg/dL — ABNORMAL HIGH (ref 8–27)
Creatinine, Ser: 6.34 mg/dL — ABNORMAL HIGH (ref 0.76–1.27)
GFR calc Af Amer: 10 mL/min/{1.73_m2} — ABNORMAL LOW (ref >59)
GFR calc non Af Amer: 9 mL/min/{1.73_m2} — ABNORMAL LOW (ref >59)
Glucose: 135 mg/dL — ABNORMAL HIGH (ref 65–99)

## 2020-10-25 LAB — SPECIMEN STATUS REPORT

## 2020-10-25 NOTE — Progress Notes (Signed)
Hi Dr. Candiss Norse,  [facility] patient seen by you in the hospital last week. I Just reviewed this patient's BMP. I tried to call him, but did not get an answer. This was added on to a sample from 3 days ago, resulted today. Cr has gone up, BUN relatively stable. K did not get reported. He has been urinating frequently, as of his visit with me on 10/25. Would you be able to see him on 10/29, or should I ask him to get admitted? Appreciate your input.  Thanks MJP

## 2020-10-25 NOTE — Progress Notes (Unsigned)
Spoke with Mike [name] who states he wishes to cancel appointment today with Dr Caryl Comes.  Mike [name] reports he had a visit last week with Dr Virgina Jock who advised Mike [name] he wpuld not be able to move forward yet with device implant.  Mike [name] has appointment again next week with Dr Virgina Jock and will schedule f/u after that visit if needed.

## 2020-10-26 ENCOUNTER — Other Ambulatory Visit: Payer: Self-pay | Admitting: Cardiology

## 2020-10-26 ENCOUNTER — Ambulatory Visit: Payer: BC Managed Care – PPO | Admitting: Cardiology

## 2020-10-26 ENCOUNTER — Other Ambulatory Visit: Payer: Self-pay

## 2020-10-26 ENCOUNTER — Encounter: Payer: Self-pay | Admitting: Cardiology

## 2020-10-26 ENCOUNTER — Telehealth: Payer: Self-pay | Admitting: Cardiology

## 2020-10-26 VITALS — BP 104/62 | HR 81 | Resp 16 | Wt 153.7 lb

## 2020-10-26 DIAGNOSIS — I739 Peripheral vascular disease, unspecified: Secondary | ICD-10-CM | POA: Diagnosis not present

## 2020-10-26 DIAGNOSIS — I5020 Unspecified systolic (congestive) heart failure: Secondary | ICD-10-CM

## 2020-10-26 DIAGNOSIS — N179 Acute kidney failure, unspecified: Secondary | ICD-10-CM

## 2020-10-26 DIAGNOSIS — I255 Ischemic cardiomyopathy: Secondary | ICD-10-CM

## 2020-10-26 DIAGNOSIS — I2510 Atherosclerotic heart disease of native coronary artery without angina pectoris: Secondary | ICD-10-CM

## 2020-10-26 NOTE — Progress Notes (Addendum)
Follow up visit  Subjective:   Mike [name], male    DOB: 05/23/1961, 59 y.o.   MRN: [id number]     HPI   Chief Complaint  Patient presents with  . HFrEF  . Follow-up    59 year old Caucasian male with CAD, culprit (RCA) and nonculprit (LCx) PCI after STEMI 07/2020, HFrEF, moderate MR, h/o cardiac arrest 2/2 Torsades post PPCI during index hospitalization, PAD with critical limb ischemia  Detailed angiogram and intervention report below.  Briefly, he has severe PAD in both lower extremities with no obvious target for endovascular revascularization in left lower extremity.  We felt his best option was to revascularize right, and external iliac artery to improve flow in his femoral artery, and then consider right femoral to left profunda bypass surgery.  Patient underwent intravascular lithotripsy and successful stenting of right common iliac and external iliac artery.  Unfortunately, he developed acute kidney injury owing to combination of hypertension, HFrEF, contrast use, and bilateral renal artery stenosis.  This improved over the next 48 hours.  Creatinine was down from 5.08-4.75 on the day of discharge, with excellent urine output.  Metoprolol, spironolactone, Entresto, and Corlanor were held on discharge, owing to renal dysfunction,. Hypotension.  I discussed with Dr. Trula Slade regarding surgical options for right to left femorofemoral bypass surgery.  While this is doable, we both felt that his perioperative cardiac risk was very high. On 10/25, we decided to optimize his heart failure treatment, and consider percutaneous revascularization options in the future.   On 10.28, his sample for 10/25 resulted showing Cr increased at 6.34. However, patient has had improvement in his breathing and leg edema. Patient and sister Jeani Hawking deny any symptoms of nausea, vomiting, confusion, disorientation. He has copious urine output.   Current Outpatient Medications on File Prior to Visit   Medication Sig Dispense Refill  . ivabradine (CORLANOR) 5 MG TABS tablet Take 5 mg by mouth 2 (two) times daily with a meal.    . aspirin 81 MG EC tablet Take 1 tablet (81 mg total) by mouth daily. Swallow whole. 90 tablet 3  . furosemide (LASIX) 20 MG tablet Take 1 tablet (20 mg total) by mouth daily as needed. 30 tablet 2  . isosorbide-hydrALAZINE (BIDIL) 20-37.5 MG tablet Take 0.5 tablets by mouth 3 (three) times daily. 30 tablet 2  . nicotine (NICODERM CQ - DOSED IN MG/24 HR) 7 mg/24hr patch Place 1 patch (7 mg total) onto the skin daily. 30 patch 1  . nitroGLYCERIN (NITROSTAT) 0.4 MG SL tablet Place 1 tablet (0.4 mg total) under the tongue every 5 (five) minutes as needed for chest pain. 30 tablet 1  . oxyCODONE-acetaminophen (PERCOCET) 5-325 MG tablet Take 1 tablet by mouth every 8 (eight) hours as needed for severe pain. 60 tablet 0  . rosuvastatin (CRESTOR) 40 MG tablet Take 1 tablet (40 mg total) by mouth daily. 90 tablet 3  . ticagrelor (BRILINTA) 90 MG TABS tablet Take 1 tablet (90 mg total) by mouth 2 (two) times daily. 180 tablet 3   Current Facility-Administered Medications on File Prior to Visit  Medication Dose Route Frequency Provider Last Rate Last Admin  . ivabradine (CORLANOR) tablet 5 mg  5 mg Oral BID WC Effie Janoski J, MD        Cardiovascular & other pertient studies:  EKG 10/22/2020: Sinus tachycardia 103 bpm Old inferior infarct Persistent inferior ST elevations since MI in 07/2020 may represent aneurysmal changes.  PV intervention 10/16/2020: Successful intravascular lithotripsy,  PTCA and stenting 7.0X39 mm balloon expandable Viabahn VBX stent Rt common iliac artery  7.0X60 mm self expanding Absolute Pro stent Rt distal iliac artery  No named vessels at ankle level in both lower extremities.  Will consult vascular surgery for Rt fem-to-left profunda bypass given left foot critical limb ischemia.  Abdominal Aortic Duplex 09/21/2020:   Moderate plaque noted in the proximal, mid and distal aorta. There is an  ulcerated plaque noted in the distal abdominal aorta. No AAA. Normal iliac  artery velocity.   Abdominal Aortic Duplex 09/21/2020:  Moderate plaque noted in the proximal, mid and distal aorta. There is an  ulcerated plaque noted in the distal abdominal aorta. No AAA. Normal iliac  artery velocity.  Lower Extremity Arterial Duplex 09/21/2020:  The right SFA is occluded in the proximal segment with reconstitution at  the level of the popliteal artery with diffuse monophasic waveform below  the knee. Right profunda femoral artery has >50% stenosis. There is  moderate mixed plaque noted throughout the right lower extremity.  Monophasic waveform throughout the left lower extremity, indicates  significant proximal disease (iliac artery).   Left SFA is occluded in the proximal segment and reconstitutes just above popliteal artery and diffuse dampened monophasic waveform throughout the lower extremity below the  knee. There is moderate mixed plaque throughout the left lower extremity.   This exam reveals severely decreased perfusion of the right lower  extremity, noted at the dorsalis pedis artery level (ABI 0.42) and  critically decreased perfusion of the left lower extremity, noted at the  dorsalis pedis and post tibial artery level (ABI 0.03).   Echocardiogram 09/11/2020:  Left ventricle cavity is moderately dilated. Moderate concentric  hypertrophy of the left ventricle. Moderate global and severe  inferolateral hypokinesis. LVEF 15-20%. Grade 1 diastolic dysfunction.  Normal left atrial pressure. Calculated EF 15%.  Mild to moderate mitral regurgitation.  Inadequate TR jet to estimate pulmonary artery systolic pressure. Normal  right atrial pressure.  Compared to previous study in 03/2020, LVEF is reduced from 30-35%. Mitral  regurgitation is marginally improved.   EKG 08/14/2020: Sinus rhythm 81 bpm Old  inferior and anterior infarct  Coronary intervention 08/03/2020: LM: Distal 40% stenosis LAD: Mid 30% disease LCx: Subtotally occluded OM2, prox LCx 70% stenosis Successful percutaneous coronary intervention OM2-Prox LCx PTCA and overlapping stents placement  2.5 X 38 mm and 2.5 X 18 mm Resolute Onyx drug-eluting stents 100%--->0% stenosis. TIMI flow 0-->III Small caliber distal vessel with moderate diffuse disease RCA: Prox 30% stenosis. Patent mid RCA sttent 2.5 X 26 mm Resolute Onyx drug-eluting stent  LVEDP 42 mmHg  Echocardiogram 08/02/2020: 1. Moderate global and severe inferolateral hypokinesis. Left ventricular  ejection fraction, by estimation, is 30 to 35%. The left ventricle has  normal function. The left ventricle demonstrates regional wall motion  abnormalities (see scoring  diagram/findings for description). There is mild left ventricular  hypertrophy. Left ventricular diastolic parameters are consistent with  Grade II diastolic dysfunction (pseudonormalization).  2. Right ventricular systolic function is low normal. The right  ventricular size is normal.  3. Left atrial size was mildly dilated.  4. The mitral valve is grossly normal. Moderate mitral valve  regurgitation.  5. The aortic valve is tricuspid. Aortic valve regurgitation is not  visualized. Mild aortic valve sclerosis is present, with no evidence of  aortic valve stenosis.  6. The inferior vena cava is dilated in size with >50% respiratory  variability, suggesting right atrial pressure of 8 mmHg.  Coronary intervention 08/01/2020: LM: Distal 30% stenosis LAD: Mid 30% disease LCx: Subtotally occluded OM2, bridging left-to-left and right-to-left collaterals RCA: Prox 30% stenosis. Mid 100% occlusion Successful percutaneous coronary intervention mid RCA PTCA and stent placement 2.5 X 26 mm Resolute Onyx drug-eluting stent 100%--->0%  stenosis. TIMI flow 0-->III   Recent labs: 09/25/2020: Glucose 102, BUN/Cr 18/1.16. EGFR 68. Na/K 140/4.5.  NT pro BNP 2404  09/11/2020: Glucose 114, BUN/Cr 19/1.42. EGFR 53. Na/K 136/4.7.  Chol 95, TG 76, HDL 30, LDL 49  08/04/2020: Glucose 101, BUN/Cr 19/1.08. EGFR >60. Na/K 139/4.0. Rest of the CMP normal H/H 14/42. MCV 89. Platelets 184. WBC count 14k HbA1C 5.8% Chol 178, TG 75, HDL 40, LDL 123    Review of Systems  Cardiovascular: Positive for claudication and dyspnea on exertion. Negative for chest pain, leg swelling, palpitations and syncope.  Respiratory: Positive for shortness of breath.          Vitals:   10/26/20 1524  BP: 104/62  Pulse: 81  Resp: 16  SpO2: 95%     Body mass index is 20.85 kg/m. Filed Weights   10/26/20 1524  Weight: 153 lb 11.2 oz (69.7 kg)     Objective:   Physical Exam Vitals and nursing note reviewed.  Constitutional:      General: He is not in acute distress. Neck:     Vascular: No JVD.  Cardiovascular:     Rate and Rhythm: Normal rate and regular rhythm.     Pulses:          Femoral pulses are 1+ on the right side and 1+ on the left side.      Dorsalis pedis pulses are 1+ on the right side and 1+ on the left side.       Posterior tibial pulses are 0 on the right side and 0 on the left side.     Heart sounds: Normal heart sounds. No murmur heard.      Comments: Progression of early dry gangrene at the tip of 2nd toe, left foot. Dusky appearance of all toes, left foot.  Mild dusky discoloration great toe right foot Pulmonary:     Effort: Pulmonary effort is normal.     Breath sounds: Normal breath sounds. No wheezing or rales.  Musculoskeletal:     Right lower leg: Edema (Trace) present.     Left lower leg: Edema (Trace) present.           Assessment & Recommendations:   59 year old Caucasian male with CAD, culprit (RCA) and nonculprit (LCx) PCI after STEMI 07/2020, HFrEF, moderate MR, h/o cardiac arrest 2/2  Torsades post PPCI during index hospitalization, PAD w/successful Rt iliac revascularization (09/2020), residual severe below the knee disease RLE, un-revascularized LLE with critical limb ischemia, AKI/CKD  AKI/CKD: Cr up to 6.34. No signs of volume overload/uremia. BMP from this morning is pending.  Should patient develop any confusion, disorientation, recommend going to the ED.  Will refer to nephroloy.  PAD: Successful revascularization of right common iliac and right external iliac artery. Un-revascularized severe PAD with critical limb ischemia left lower extremity. Given patient's severely CAD, HFrEF, surgical risk for revascularization is very high. This includes continued management of his HFrEF with attempt at percutaneous revascularization in 2-3 weeks.  This will give patient a chance to recover from his acute kidney injury, and also give  a chance for endothelialization of recently placed stents in right iliac artery.  We will then consider revascularization of ostially occluded left  common iliac artery.  I discussed potential risks with patient, including but not limited to, acute limb ischemia requiring emergency surgical revascularization and/or amputation, acute kidney injury, other vascular complications, as well as cardiac complications such as cardiac arrest, death.  Patient is severely limited with his physical activity, and also has resting leg pain and early gangrenous changes in left foot second toe.  Patient wants to proceed with any form of revascularization, if possible.  In the meantime, I have referred him to wound care center.  In spite of successful revascularization, there is high chance that patient may develop some degree of dry gangrene and require amputation of toes or some part of his left lower extremity in near future. With successful revascularization, we can hopefully salvage as much left lower extremity as possible. We will use CO2 to reduce risk of acute kidney  injury.  Continue Aspirin/Brilinta, as he is on this DAPT post coronary revascularization sine MI in 07/2020. Patient is currently on Percocet for pain control.  CAD: STEMI 07/2020.  Primary PCI to culprit vessel RCA. Resolute Onyx drug-eluting stent 2.5X26 mm DES Recurrent chest pain and shortness of breath requiring nonculprit PCI to OM2 during index hospitalization.  Successful percutaneous coronary intervention OM2-Prox LCx PTCA and overlapping stents placement  2.5 X 38 mm and 2.5 X 18 mm Resolute Onyx drug-eluting stents 100%--->0% stenosis. TIMI flow 0-->III Small caliber distal vessel with moderate diffuse disease Residual moderate distal left main, prox RCA, and mild mid LAD disease.  No exertional angina to indicate an revascularized severe CAD to be the cause of his persistent low EF. Persistent inferior ST elevations in absence of chest pain since successful revascularization in 07/2020 likely due to aneurysmal changes.  Recommend DAPT with aspirin and Brilinta at least till 07/2021. Hold Crestor in light of AKI. Discussion regarding other medications, below.  HFrEF: EF 15-20% in spite of optimum revascularization.  I suspect he may have had reduced EF even prior to his STEMI, owing to his chronically occluded left circumflex, which is now revascularized. Acute decompensation from 10/15 is now improved. Continue corlanor 5 mg bid, bidil 20-37.5 mg 1/2 tab bid. Unable to use ARNI/ARB due to AKI.  Hope to resume other medications over the next few days. Repeat echocardiogram in 12/2020.   H/o cardiac arrest: Medication Torsades post PPCI during index hospitalization with no recurrence EF remains 10-15% 6 weeks post MI Seen by Dr, Caryl Comes. Awaiting up-titration of medical therapy, prior to decision re:ICD  Hyperlipidemia: LDL down from 123 to 49. Hold Crestor in light of AKI.  Snoring, sinus pauses: Noted to have up to 4-second sinus pauses during  sleep.  Suspect he may have obstructive sleep apnea.    Tobacco dependence: Re-emphasized tobacco cessation.   Patient would like his Sister Kandyce Rud to be his healthcare power of attorney.  Patient also expressed wishes regarding not wanted to be resuscitated.  He states that this is largely due to his severe ongoing pain and states that "I cannot continue like this".  I have explained to the patient regarding the procedures that we are hoping to improve his resting leg pain.  He agrees with maintaining full CODE STATUS in the periprocedural period.  F/u in week with RLE ABI and aorta duplex   Nigel Mormon, MD Pager: [phone] Office: [phone]

## 2020-10-26 NOTE — Telephone Encounter (Signed)
I called patient this morning, unable to reach.  I was able to reach his Sister Kandyce Rud who lives 2 miles from him.  She last saw the patient on 10/28 at 5:30 PM.  I explained to her regarding his elevated creatinine.  I am concerned that he could develop uremia.  I recommended the following.  Repeat lab test at [place] today, followed by office visit with me.  He may still need to be admitted to the hospital.  Alternatively, if he has any confusion or disorientation, he should go straight to the emergency room.   Nigel Mormon, MD Pager: [phone] Office: [phone]

## 2020-10-27 LAB — BASIC METABOLIC PANEL
BUN/Creatinine Ratio: 15 (ref 10–24)
BUN: 79 mg/dL (ref 8–27)
CO2: 11 mmol/L — ABNORMAL LOW (ref 20–29)
Calcium: 9.1 mg/dL (ref 8.6–10.2)
Chloride: 98 mmol/L (ref 96–106)
Creatinine, Ser: 5.36 mg/dL — ABNORMAL HIGH (ref 0.76–1.27)
GFR calc Af Amer: 12 mL/min/{1.73_m2} — ABNORMAL LOW (ref >59)
GFR calc non Af Amer: 11 mL/min/{1.73_m2} — ABNORMAL LOW (ref >59)
Glucose: 210 mg/dL — ABNORMAL HIGH (ref 65–99)
Potassium: 3.9 mmol/L (ref 3.5–5.2)
Sodium: 135 mmol/L (ref 134–144)

## 2020-10-29 ENCOUNTER — Ambulatory Visit: Payer: BC Managed Care – PPO | Admitting: Cardiology

## 2020-10-30 ENCOUNTER — Other Ambulatory Visit: Payer: Self-pay | Admitting: Cardiology

## 2020-10-30 ENCOUNTER — Telehealth: Payer: Self-pay

## 2020-10-30 ENCOUNTER — Other Ambulatory Visit: Payer: Self-pay

## 2020-10-30 ENCOUNTER — Inpatient Hospital Stay (HOSPITAL_COMMUNITY)
Admission: EM | Admit: 2020-10-30 | Discharge: 2020-11-15 | DRG: 239 | Disposition: A | Discharge status: Skilled Nursing Facility | Payer: BC Managed Care – PPO | Attending: Internal Medicine | Admitting: Internal Medicine

## 2020-10-30 ENCOUNTER — Emergency Department (HOSPITAL_COMMUNITY): Payer: BC Managed Care – PPO

## 2020-10-30 ENCOUNTER — Encounter (HOSPITAL_COMMUNITY): Payer: Self-pay | Admitting: Pediatrics

## 2020-10-30 DIAGNOSIS — E1165 Type 2 diabetes mellitus with hyperglycemia: Secondary | ICD-10-CM | POA: Diagnosis not present

## 2020-10-30 DIAGNOSIS — R739 Hyperglycemia, unspecified: Secondary | ICD-10-CM | POA: Diagnosis not present

## 2020-10-30 DIAGNOSIS — E1152 Type 2 diabetes mellitus with diabetic peripheral angiopathy with gangrene: Secondary | ICD-10-CM | POA: Diagnosis not present

## 2020-10-30 DIAGNOSIS — Z9889 Other specified postprocedural states: Secondary | ICD-10-CM

## 2020-10-30 DIAGNOSIS — Z7982 Long term (current) use of aspirin: Secondary | ICD-10-CM

## 2020-10-30 DIAGNOSIS — F17210 Nicotine dependence, cigarettes, uncomplicated: Secondary | ICD-10-CM | POA: Diagnosis present

## 2020-10-30 DIAGNOSIS — R410 Disorientation, unspecified: Secondary | ICD-10-CM

## 2020-10-30 DIAGNOSIS — G8929 Other chronic pain: Secondary | ICD-10-CM | POA: Diagnosis present

## 2020-10-30 DIAGNOSIS — L02416 Cutaneous abscess of left lower limb: Secondary | ICD-10-CM | POA: Diagnosis not present

## 2020-10-30 DIAGNOSIS — R630 Anorexia: Secondary | ICD-10-CM

## 2020-10-30 DIAGNOSIS — Z89612 Acquired absence of left leg above knee: Secondary | ICD-10-CM | POA: Diagnosis not present

## 2020-10-30 DIAGNOSIS — D72829 Elevated white blood cell count, unspecified: Secondary | ICD-10-CM | POA: Diagnosis present

## 2020-10-30 DIAGNOSIS — R509 Fever, unspecified: Secondary | ICD-10-CM | POA: Diagnosis not present

## 2020-10-30 DIAGNOSIS — I5042 Chronic combined systolic (congestive) and diastolic (congestive) heart failure: Secondary | ICD-10-CM

## 2020-10-30 DIAGNOSIS — L89152 Pressure ulcer of sacral region, stage 2: Secondary | ICD-10-CM | POA: Diagnosis not present

## 2020-10-30 DIAGNOSIS — R339 Retention of urine, unspecified: Secondary | ICD-10-CM | POA: Diagnosis not present

## 2020-10-30 DIAGNOSIS — I5022 Chronic systolic (congestive) heart failure: Secondary | ICD-10-CM | POA: Diagnosis not present

## 2020-10-30 DIAGNOSIS — N401 Enlarged prostate with lower urinary tract symptoms: Secondary | ICD-10-CM | POA: Diagnosis not present

## 2020-10-30 DIAGNOSIS — I252 Old myocardial infarction: Secondary | ICD-10-CM | POA: Diagnosis not present

## 2020-10-30 DIAGNOSIS — J449 Chronic obstructive pulmonary disease, unspecified: Secondary | ICD-10-CM | POA: Diagnosis present

## 2020-10-30 DIAGNOSIS — F17200 Nicotine dependence, unspecified, uncomplicated: Secondary | ICD-10-CM | POA: Diagnosis present

## 2020-10-30 DIAGNOSIS — I5043 Acute on chronic combined systolic (congestive) and diastolic (congestive) heart failure: Secondary | ICD-10-CM | POA: Diagnosis not present

## 2020-10-30 DIAGNOSIS — I248 Other forms of acute ischemic heart disease: Secondary | ICD-10-CM | POA: Diagnosis present

## 2020-10-30 DIAGNOSIS — L97529 Non-pressure chronic ulcer of other part of left foot with unspecified severity: Secondary | ICD-10-CM | POA: Diagnosis not present

## 2020-10-30 DIAGNOSIS — G546 Phantom limb syndrome with pain: Secondary | ICD-10-CM | POA: Diagnosis not present

## 2020-10-30 DIAGNOSIS — E11621 Type 2 diabetes mellitus with foot ulcer: Secondary | ICD-10-CM | POA: Diagnosis present

## 2020-10-30 DIAGNOSIS — N281 Cyst of kidney, acquired: Secondary | ICD-10-CM | POA: Diagnosis not present

## 2020-10-30 DIAGNOSIS — E782 Mixed hyperlipidemia: Secondary | ICD-10-CM | POA: Diagnosis not present

## 2020-10-30 DIAGNOSIS — I70262 Atherosclerosis of native arteries of extremities with gangrene, left leg: Secondary | ICD-10-CM | POA: Diagnosis not present

## 2020-10-30 DIAGNOSIS — Z95810 Presence of automatic (implantable) cardiac defibrillator: Secondary | ICD-10-CM

## 2020-10-30 DIAGNOSIS — N179 Acute kidney failure, unspecified: Secondary | ICD-10-CM | POA: Diagnosis present

## 2020-10-30 DIAGNOSIS — Z7189 Other specified counseling: Secondary | ICD-10-CM

## 2020-10-30 DIAGNOSIS — I739 Peripheral vascular disease, unspecified: Secondary | ICD-10-CM

## 2020-10-30 DIAGNOSIS — I70228 Atherosclerosis of native arteries of extremities with rest pain, other extremity: Secondary | ICD-10-CM | POA: Diagnosis not present

## 2020-10-30 DIAGNOSIS — Z7401 Bed confinement status: Secondary | ICD-10-CM | POA: Diagnosis not present

## 2020-10-30 DIAGNOSIS — I2510 Atherosclerotic heart disease of native coronary artery without angina pectoris: Secondary | ICD-10-CM | POA: Diagnosis present

## 2020-10-30 DIAGNOSIS — I509 Heart failure, unspecified: Secondary | ICD-10-CM

## 2020-10-30 DIAGNOSIS — S31809A Unspecified open wound of unspecified buttock, initial encounter: Secondary | ICD-10-CM | POA: Diagnosis not present

## 2020-10-30 DIAGNOSIS — I5020 Unspecified systolic (congestive) heart failure: Secondary | ICD-10-CM | POA: Diagnosis not present

## 2020-10-30 DIAGNOSIS — I70229 Atherosclerosis of native arteries of extremities with rest pain, unspecified extremity: Secondary | ICD-10-CM

## 2020-10-30 DIAGNOSIS — E876 Hypokalemia: Secondary | ICD-10-CM | POA: Diagnosis not present

## 2020-10-30 DIAGNOSIS — M2550 Pain in unspecified joint: Secondary | ICD-10-CM | POA: Diagnosis not present

## 2020-10-30 DIAGNOSIS — R109 Unspecified abdominal pain: Secondary | ICD-10-CM

## 2020-10-30 DIAGNOSIS — R778 Other specified abnormalities of plasma proteins: Secondary | ICD-10-CM

## 2020-10-30 DIAGNOSIS — Z515 Encounter for palliative care: Secondary | ICD-10-CM | POA: Diagnosis not present

## 2020-10-30 DIAGNOSIS — I959 Hypotension, unspecified: Secondary | ICD-10-CM | POA: Diagnosis present

## 2020-10-30 DIAGNOSIS — R072 Precordial pain: Secondary | ICD-10-CM | POA: Diagnosis not present

## 2020-10-30 DIAGNOSIS — L8990 Pressure ulcer of unspecified site, unspecified stage: Secondary | ICD-10-CM | POA: Insufficient documentation

## 2020-10-30 DIAGNOSIS — Z8674 Personal history of sudden cardiac arrest: Secondary | ICD-10-CM

## 2020-10-30 DIAGNOSIS — E43 Unspecified severe protein-calorie malnutrition: Secondary | ICD-10-CM | POA: Diagnosis present

## 2020-10-30 DIAGNOSIS — I9589 Other hypotension: Secondary | ICD-10-CM | POA: Diagnosis present

## 2020-10-30 DIAGNOSIS — Z720 Tobacco use: Secondary | ICD-10-CM | POA: Diagnosis present

## 2020-10-30 DIAGNOSIS — R1084 Generalized abdominal pain: Secondary | ICD-10-CM | POA: Diagnosis not present

## 2020-10-30 DIAGNOSIS — Z681 Body mass index (BMI) 19 or less, adult: Secondary | ICD-10-CM

## 2020-10-30 DIAGNOSIS — N139 Obstructive and reflux uropathy, unspecified: Secondary | ICD-10-CM | POA: Diagnosis present

## 2020-10-30 DIAGNOSIS — Z20822 Contact with and (suspected) exposure to covid-19: Secondary | ICD-10-CM | POA: Diagnosis not present

## 2020-10-30 DIAGNOSIS — I70212 Atherosclerosis of native arteries of extremities with intermittent claudication, left leg: Secondary | ICD-10-CM | POA: Diagnosis not present

## 2020-10-30 DIAGNOSIS — R2689 Other abnormalities of gait and mobility: Secondary | ICD-10-CM | POA: Diagnosis not present

## 2020-10-30 DIAGNOSIS — I255 Ischemic cardiomyopathy: Secondary | ICD-10-CM | POA: Diagnosis present

## 2020-10-30 DIAGNOSIS — R079 Chest pain, unspecified: Secondary | ICD-10-CM | POA: Diagnosis not present

## 2020-10-30 DIAGNOSIS — Z955 Presence of coronary angioplasty implant and graft: Secondary | ICD-10-CM

## 2020-10-30 DIAGNOSIS — F05 Delirium due to known physiological condition: Secondary | ICD-10-CM | POA: Diagnosis not present

## 2020-10-30 DIAGNOSIS — E872 Acidosis, unspecified: Secondary | ICD-10-CM

## 2020-10-30 DIAGNOSIS — Z66 Do not resuscitate: Secondary | ICD-10-CM | POA: Diagnosis not present

## 2020-10-30 DIAGNOSIS — R52 Pain, unspecified: Secondary | ICD-10-CM | POA: Diagnosis not present

## 2020-10-30 DIAGNOSIS — I70222 Atherosclerosis of native arteries of extremities with rest pain, left leg: Secondary | ICD-10-CM | POA: Diagnosis not present

## 2020-10-30 DIAGNOSIS — K5900 Constipation, unspecified: Secondary | ICD-10-CM | POA: Diagnosis not present

## 2020-10-30 DIAGNOSIS — I998 Other disorder of circulatory system: Secondary | ICD-10-CM | POA: Diagnosis not present

## 2020-10-30 DIAGNOSIS — Z95820 Peripheral vascular angioplasty status with implants and grafts: Secondary | ICD-10-CM

## 2020-10-30 DIAGNOSIS — M6281 Muscle weakness (generalized): Secondary | ICD-10-CM | POA: Diagnosis not present

## 2020-10-30 DIAGNOSIS — R338 Other retention of urine: Secondary | ICD-10-CM

## 2020-10-30 DIAGNOSIS — L97129 Non-pressure chronic ulcer of left thigh with unspecified severity: Secondary | ICD-10-CM | POA: Diagnosis not present

## 2020-10-30 DIAGNOSIS — I5023 Acute on chronic systolic (congestive) heart failure: Secondary | ICD-10-CM

## 2020-10-30 DIAGNOSIS — Z79899 Other long term (current) drug therapy: Secondary | ICD-10-CM

## 2020-10-30 DIAGNOSIS — I70241 Atherosclerosis of native arteries of left leg with ulceration of thigh: Secondary | ICD-10-CM | POA: Diagnosis not present

## 2020-10-30 DIAGNOSIS — I110 Hypertensive heart disease with heart failure: Secondary | ICD-10-CM | POA: Diagnosis not present

## 2020-10-30 DIAGNOSIS — R5081 Fever presenting with conditions classified elsewhere: Secondary | ICD-10-CM | POA: Diagnosis not present

## 2020-10-30 DIAGNOSIS — I701 Atherosclerosis of renal artery: Secondary | ICD-10-CM | POA: Diagnosis present

## 2020-10-30 DIAGNOSIS — J189 Pneumonia, unspecified organism: Secondary | ICD-10-CM | POA: Diagnosis not present

## 2020-10-30 DIAGNOSIS — D649 Anemia, unspecified: Secondary | ICD-10-CM | POA: Diagnosis present

## 2020-10-30 DIAGNOSIS — Z8249 Family history of ischemic heart disease and other diseases of the circulatory system: Secondary | ICD-10-CM

## 2020-10-30 DIAGNOSIS — Z4781 Encounter for orthopedic aftercare following surgical amputation: Secondary | ICD-10-CM | POA: Diagnosis not present

## 2020-10-30 DIAGNOSIS — R4781 Slurred speech: Secondary | ICD-10-CM | POA: Diagnosis not present

## 2020-10-30 DIAGNOSIS — Z7902 Long term (current) use of antithrombotics/antiplatelets: Secondary | ICD-10-CM

## 2020-10-30 DIAGNOSIS — K761 Chronic passive congestion of liver: Secondary | ICD-10-CM | POA: Diagnosis present

## 2020-10-30 DIAGNOSIS — N185 Chronic kidney disease, stage 5: Secondary | ICD-10-CM | POA: Diagnosis not present

## 2020-10-30 DIAGNOSIS — I455 Other specified heart block: Secondary | ICD-10-CM | POA: Diagnosis not present

## 2020-10-30 DIAGNOSIS — L089 Local infection of the skin and subcutaneous tissue, unspecified: Secondary | ICD-10-CM | POA: Diagnosis not present

## 2020-10-30 HISTORY — DX: Peripheral vascular disease, unspecified: I73.9

## 2020-10-30 LAB — APTT: aPTT: 31 seconds (ref 24–36)

## 2020-10-30 LAB — LACTIC ACID, PLASMA: Lactic Acid, Venous: 1.5 mmol/L (ref 0.5–1.9)

## 2020-10-30 LAB — TROPONIN I (HIGH SENSITIVITY)
Troponin I (High Sensitivity): 364 ng/L (ref <18)
Troponin I (High Sensitivity): 322 ng/L (ref <18)

## 2020-10-30 LAB — BASIC METABOLIC PANEL
Anion gap: 23 — ABNORMAL HIGH (ref 5–15)
BUN: 104 mg/dL — ABNORMAL HIGH (ref 8–23)
CO2: 11 mmol/L — ABNORMAL LOW (ref 22–32)
Calcium: 9.3 mg/dL (ref 8.9–10.3)
Chloride: 102 mmol/L (ref 98–111)
Creatinine, Ser: 4.62 mg/dL — ABNORMAL HIGH (ref 0.61–1.24)
GFR, Estimated: 14 mL/min — ABNORMAL LOW (ref >60)
Glucose, Bld: 205 mg/dL — ABNORMAL HIGH (ref 70–99)
Potassium: 4.1 mmol/L (ref 3.5–5.1)
Sodium: 136 mmol/L (ref 135–145)

## 2020-10-30 LAB — CBC
HCT: 37.3 % — ABNORMAL LOW (ref 39.0–52.0)
Hemoglobin: 12.2 g/dL — ABNORMAL LOW (ref 13.0–17.0)
MCH: 27.9 pg (ref 26.0–34.0)
MCHC: 32.7 g/dL (ref 30.0–36.0)
MCV: 85.4 fL (ref 80.0–100.0)
Platelets: 379 10*3/uL (ref 150–400)
RBC: 4.37 MIL/uL (ref 4.22–5.81)
RDW: 16.8 % — ABNORMAL HIGH (ref 11.5–15.5)
WBC: 21.9 10*3/uL — ABNORMAL HIGH (ref 4.0–10.5)
nRBC: 0 % (ref 0.0–0.2)

## 2020-10-30 LAB — RESPIRATORY PANEL BY RT PCR (FLU A&B, COVID)
Influenza A by PCR: NEGATIVE
Influenza B by PCR: NEGATIVE
SARS Coronavirus 2 by RT PCR: NEGATIVE

## 2020-10-30 LAB — PROTIME-INR
INR: 1.2 (ref 0.8–1.2)
Prothrombin Time: 14.8 seconds (ref 11.4–15.2)

## 2020-10-30 LAB — CBG MONITORING, ED: Glucose-Capillary: 111 mg/dL — ABNORMAL HIGH (ref 70–99)

## 2020-10-30 LAB — PROCALCITONIN: Procalcitonin: 1.19 ng/mL

## 2020-10-30 LAB — BRAIN NATRIURETIC PEPTIDE: B Natriuretic Peptide: 488.4 pg/mL — ABNORMAL HIGH (ref 0.0–100.0)

## 2020-10-30 MED ORDER — INSULIN ASPART 100 UNIT/ML ~~LOC~~ SOLN
0.0000 [IU] | SUBCUTANEOUS | Status: DC
  Administered 2020-10-31 – 2020-11-06 (×10): 1 [IU] via SUBCUTANEOUS
  Administered 2020-11-06: 2 [IU] via SUBCUTANEOUS
  Administered 2020-11-07 – 2020-11-11 (×8): 1 [IU] via SUBCUTANEOUS

## 2020-10-30 MED ORDER — NITROGLYCERIN 0.4 MG SL SUBL
0.4000 mg | SUBLINGUAL_TABLET | SUBLINGUAL | Status: DC | PRN
  Filled 2020-10-30: qty 1

## 2020-10-30 MED ORDER — FENTANYL CITRATE (PF) 100 MCG/2ML IJ SOLN
50.0000 ug | Freq: Once | INTRAMUSCULAR | Status: AC
  Administered 2020-10-30: 50 ug via INTRAVENOUS
  Filled 2020-10-30: qty 2

## 2020-10-30 MED ORDER — ASPIRIN 81 MG PO CHEW
324.0000 mg | CHEWABLE_TABLET | Freq: Once | ORAL | Status: AC
  Administered 2020-10-30: 324 mg via ORAL
  Filled 2020-10-30: qty 4

## 2020-10-30 NOTE — Telephone Encounter (Signed)
Aware that patient is on his way to the ED

## 2020-10-30 NOTE — ED Notes (Signed)
ED Provider at bedside. 

## 2020-10-30 NOTE — ED Provider Notes (Signed)
[facility] EMERGENCY DEPARTMENT Provider Note   CSN: [id number] Arrival date & time: 10/30/20  1635     History Chief Complaint  Patient presents with  . Chest Pain    Mike [name] is a 58 y.o. male.  Presents to ER with multiple complaints.  Reports around 2:00 he started having pain everywhere.  Reports that he is having pain in his left leg and his chest as well as difficulty in breathing.  Reports the leg pain is unchanged from his daily pain that he has been experiencing.  Sharp, stabbing, radiates along leg.  Reports chest discomfort is mild, 2 or 3 out of 10 in severity central, nonradiating.  Feels somewhat short of breath at rest, no change with exertion.    Per note on 10/29: 59 year old Caucasian male with CAD, culprit (RCA) and nonculprit (LCx) PCI after STEMI 07/2020, HFrEF, moderate MR, h/o cardiac arrest 2/2 Torsades post PPCI during index hospitalization, PAD with critical limb ischemia  ...he has severe PAD in both lower extremities with no obvious target for endovascular revascularization in left lower extremity. We felt his best option was to revascularize right, and external iliac artery to improve flow in his femoral artery, and then consider right femoral to left profunda bypass surgery.  Patient underwent intravascular lithotripsy and successful stenting of right common iliac and external iliac artery. Unfortunately, he developed acute kidney injury owing to combination of hypertension, HFrEF, contrast use, and bilateral renal artery stenosis. This improved over the next 48 hours. Creatinine was down from 5.08-4.75 on the day of discharge, with excellent urine output. Metoprolol, spironolactone, Entresto, and Corlanor were held on discharge, owing to renal dysfunction,. Hypotension.  I discussed with Dr. Trula Slade regarding surgical options for right to left femorofemoral bypass surgery.  While this is doable, we both felt that his  perioperative cardiac risk was very high. On 10/25, we decided to optimize his heart failure treatment, and consider percutaneous revascularization options in the future.    HPI     Past Medical History:  Diagnosis Date  . CAD (coronary artery disease)   . Heart attack ([place])   . Hyperlipidemia   . Hypertension     Patient Active Problem List   Diagnosis Date Noted  . Ischemic cardiomyopathy 10/24/2020  . AKI (acute kidney injury) ([place]) 10/17/2020  . PAD (peripheral artery disease) ([place]) 10/16/2020  . Critical lower limb ischemia ([place]) 10/05/2020  . Rest pain of left upper extremity due to atherosclerosis ([place]) 10/05/2020  . Severe claudication ([place]) 09/20/2020  . Paresthesia of both lower extremities 09/20/2020  . H/O cardiac arrest 09/20/2020  . H/O acute myocardial infarction 08/14/2020  . Snoring 08/14/2020  . Sinus pause 08/14/2020  . Mixed hyperlipidemia 08/14/2020  . Tobacco dependence 08/14/2020  . Coronary artery disease involving native coronary artery of native heart without angina pectoris 08/10/2020  . HFrEF (heart failure with reduced ejection fraction) ([place]) 08/10/2020  . Moderate mitral regurgitation 08/10/2020  . STEMI (ST elevation myocardial infarction) ([place]) 08/02/2020  . Accelerated hypertension 08/01/2020    Past Surgical History:  Procedure Laterality Date  . ABDOMINAL AORTOGRAM W/LOWER EXTREMITY N/A 10/16/2020   Procedure: ABDOMINAL AORTOGRAM W/LOWER EXTREMITY;  Surgeon: Nigel Mormon, MD;  Location: [facility];  Service: Cardiovascular;  Laterality: N/A;  . CORONARY STENT INTERVENTION N/A 08/03/2020   Procedure: CORONARY STENT INTERVENTION;  Surgeon: Nigel Mormon, MD;  Location: [facility];  Service: Cardiovascular;  Laterality: N/A;  . CORONARY/GRAFT  ACUTE MI REVASCULARIZATION N/A 08/01/2020   Procedure: Coronary/Graft Acute MI Revascularization;  Surgeon: Nigel Mormon, MD;  Location: [facility];  Service:  Cardiovascular;  Laterality: N/A;  . INTRAVASCULAR LITHOTRIPSY Right 10/16/2020   Procedure: INTRAVASCULAR LITHOTRIPSY;  Surgeon: Nigel Mormon, MD;  Location: [facility];  Service: Cardiovascular;  Laterality: Right;  Common and External Iliac  . LEFT HEART CATH AND CORONARY ANGIOGRAPHY N/A 08/01/2020   Procedure: LEFT HEART CATH AND CORONARY ANGIOGRAPHY;  Surgeon: Nigel Mormon, MD;  Location: [facility];  Service: Cardiovascular;  Laterality: N/A;  . LEFT HEART CATH AND CORONARY ANGIOGRAPHY N/A 08/03/2020   Procedure: LEFT HEART CATH AND CORONARY ANGIOGRAPHY;  Surgeon: Nigel Mormon, MD;  Location: [facility];  Service: Cardiovascular;  Laterality: N/A;  . PERIPHERAL VASCULAR INTERVENTION Right 10/16/2020   Procedure: PERIPHERAL VASCULAR INTERVENTION;  Surgeon: Nigel Mormon, MD;  Location: [facility];  Service: Cardiovascular;  Laterality: Right;  Common and Iliac Stent       Family History  Problem Relation Age of Onset  . Heart disease Mother   . Heart disease Father   . Cancer Brother     Social History   Tobacco Use  . Smoking status: Current Some Day Smoker    Packs/day: 0.50    Years: 40.00    Pack years: 20.00    Types: Cigarettes  . Smokeless tobacco: Never Used  Vaping Use  . Vaping Use: Never used  Substance Use Topics  . Alcohol use: Yes    Alcohol/week: 10.0 standard drinks    Types: 10 Cans of beer per week    Comment: occ  . Drug use: Never    Home Medications Prior to Admission medications   Medication Sig Start Date End Date Taking? Authorizing Provider  aspirin 81 MG EC tablet Take 1 tablet (81 mg total) by mouth daily. Swallow whole. 08/14/20   Patwardhan, Reynold Bowen, MD  furosemide (LASIX) 20 MG tablet Take 1 tablet (20 mg total) by mouth daily as needed. 10/18/20 01/16/21  Patwardhan, Reynold Bowen, MD  isosorbide-hydrALAZINE (BIDIL) 20-37.5 MG tablet Take 0.5 tablets by mouth 3 (three) times daily.  10/22/20   Patwardhan, Reynold Bowen, MD  ivabradine (CORLANOR) 5 MG TABS tablet Take 5 mg by mouth 2 (two) times daily with a meal.    [provider]  nicotine (NICODERM CQ - DOSED IN MG/24 HR) 7 mg/24hr patch Place 1 patch (7 mg total) onto the skin daily. 08/04/20 08/04/21  Patwardhan, Reynold Bowen, MD  nitroGLYCERIN (NITROSTAT) 0.4 MG SL tablet Place 1 tablet (0.4 mg total) under the tongue every 5 (five) minutes as needed for chest pain. 08/14/20 11/12/20  Patwardhan, Reynold Bowen, MD  oxyCODONE-acetaminophen (PERCOCET) 5-325 MG tablet Take 1 tablet by mouth every 8 (eight) hours as needed for severe pain. 10/18/20   Patwardhan, Reynold Bowen, MD  rosuvastatin (CRESTOR) 40 MG tablet Take 1 tablet (40 mg total) by mouth daily. 10/04/20   Patwardhan, Reynold Bowen, MD  ticagrelor (BRILINTA) 90 MG TABS tablet Take 1 tablet (90 mg total) by mouth 2 (two) times daily. 08/14/20   Patwardhan, Reynold Bowen, MD    Allergies    Patient has no known allergies.  Review of Systems   Review of Systems  Constitutional: Negative for chills and fever.  HENT: Negative for ear pain and sore throat.   Eyes: Negative for pain and visual disturbance.  Respiratory: Positive for chest tightness and  shortness of breath. Negative for cough.   Cardiovascular: Positive for chest pain. Negative for palpitations.  Gastrointestinal: Negative for abdominal pain and vomiting.  Genitourinary: Negative for dysuria and hematuria.  Musculoskeletal: Positive for arthralgias and myalgias. Negative for back pain.  Skin: Negative for color change and rash.  Neurological: Negative for seizures and syncope.  All other systems reviewed and are negative.   Physical Exam Updated Vital Signs BP 140/76   Pulse 86   Temp (!) 97.4 F (36.3 C) (Oral)   Resp (!) 32   SpO2 99%   Physical Exam Vitals and nursing note reviewed.  Constitutional:      Comments: Appears somewhat uncomfortable  HENT:     Head: Normocephalic and atraumatic.  Eyes:      Conjunctiva/sclera: Conjunctivae normal.  Cardiovascular:     Rate and Rhythm: Normal rate and regular rhythm.     Heart sounds: No murmur heard.   Pulmonary:     Effort: Pulmonary effort is normal. No respiratory distress.     Breath sounds: Normal breath sounds.  Abdominal:     Palpations: Abdomen is soft.     Tenderness: There is no abdominal tenderness.  Musculoskeletal:     Cervical back: Neck supple.     Comments: LLE: somewhat pale, slightly cool to touch, unable to palpate or doppler dp and pt pulses  Skin:    General: Skin is warm and dry.  Neurological:     General: No focal deficit present.     Mental Status: He is alert.     ED Results / Procedures / Treatments   Labs (all labs ordered are listed, but only abnormal results are displayed) Labs Reviewed  BASIC METABOLIC PANEL - Abnormal; Notable for the following components:      Result Value   CO2 11 (*)    Glucose, Bld 205 (*)    BUN 104 (*)    Creatinine, Ser 4.62 (*)    GFR, Estimated 14 (*)    Anion gap 23 (*)    All other components within normal limits  CBC - Abnormal; Notable for the following components:   WBC 21.9 (*)    Hemoglobin 12.2 (*)    HCT 37.3 (*)    RDW 16.8 (*)    All other components within normal limits  BRAIN NATRIURETIC PEPTIDE - Abnormal; Notable for the following components:   B Natriuretic Peptide 488.4 (*)    All other components within normal limits  TROPONIN I (HIGH SENSITIVITY) - Abnormal; Notable for the following components:   Troponin I (High Sensitivity) 364 (*)    All other components within normal limits  TROPONIN I (HIGH SENSITIVITY) - Abnormal; Notable for the following components:   Troponin I (High Sensitivity) 322 (*)    All other components within normal limits  RESPIRATORY PANEL BY RT PCR (FLU A&B, COVID)  PROTIME-INR  APTT    EKG EKG Interpretation  Date/Time:  Tuesday October 30 2020 17:35:20 EDT Ventricular Rate:  97 PR Interval:  140 QRS  Duration: 112 QT Interval:  369 QTC Calculation: 469 R Axis:   74 Text Interpretation: Sinus rhythm nonspecific ST elevations noted in inferior leads no acute STEMI, confirmed with [facility] Confirmed by Madalyn Rob [phone]) on 10/30/2020 7:43:37 PM   Radiology DG Chest Portable 1 View  Result Date: 10/30/2020 CLINICAL DATA:  Chest pain EXAM: PORTABLE CHEST 1 VIEW COMPARISON:  08/03/2020 FINDINGS: Single frontal view of the chest demonstrates stenting artifact from external cardiac device.  Cardiac silhouette is unremarkable. No airspace disease, effusion, or pneumothorax. No acute bony abnormalities. IMPRESSION: 1. No acute intrathoracic process. Electronically Signed   By: Randa Ngo M.D.   On: 10/30/2020 17:01    Procedures .Critical Care Performed by: Lucrezia Starch, MD Authorized by: Lucrezia Starch, MD   Critical care provider statement:    Critical care time (minutes):  48   Critical care was necessary to treat or prevent imminent or life-threatening deterioration of the following conditions:  Cardiac failure and circulatory failure   Critical care was time spent personally by me on the following activities:  Discussions with consultants, evaluation of patient's response to treatment, examination of patient, ordering and performing treatments and interventions, ordering and review of laboratory studies, ordering and review of radiographic studies, pulse oximetry, re-evaluation of patient's condition, obtaining history from patient or surrogate and review of old charts   (including critical care time)  Medications Ordered in ED Medications  nitroGLYCERIN (NITROSTAT) SL tablet 0.4 mg (has no administration in time range)  aspirin chewable tablet 324 mg (324 mg Oral Given 10/30/20 1825)  fentaNYL (SUBLIMAZE) injection 50 mcg (50 mcg Intravenous Given 10/30/20 1825)  fentaNYL (SUBLIMAZE) injection 50 mcg (50 mcg Intravenous Given 10/30/20 1947)    ED Course  I have reviewed  the triage vital signs and the nursing notes.  Pertinent labs & imaging results that were available during my care of the patient were reviewed by me and considered in my medical decision making (see chart for details).  Clinical Course as of Oct 30 1950  Tue Oct 30, 2020  1832 D/w    [RD]    Clinical Course User Index [RD] Lucrezia Starch, MD   MDM Rules/Calculators/A&P                         59 year old male with extensive medical history including CAD, heart failure with reduced ejection fraction, severe PAD presenting to ER with concern for shortness of breath, chest pain and left leg pain.  On exam, patient somewhat uncomfortable appearing from pain, noted left leg was pale, unable to palpate DP or PT pulses.  EKG with some ST segment elevations in inferior leads.  Reviewed his case in detail with Dr. Einar Gip.  He reports that he evaluated patient while he was in triage in ER and has reviewed his case extensively with patient's primary cardiologist Dr. Virgina Jock. Regarding left leg, he reports patient has known severe PAD, case has been reviewed with vascular [facility], patient needs amputation but is not surgical candidate, exam today is unchanged from prior and he recommends no change in current plan. Regarding EKG and troponin, states these st elevations were present after PCI on last admission and he does NOT believe the EKG today represents an acute STEMI. Ganji recommended dc or admit to hospitalist for observation. Someone from their team will leave formal consult note tomorrow am. Additionally recommends palliative care consult.    Consult to [facility] for admit.  Final Clinical Impression(s) / ED Diagnoses Final diagnoses:  Chest pain, unspecified type  Elevated troponin  PAD (peripheral artery disease) (HCC)  Heart failure, unspecified HF chronicity, unspecified heart failure type [facility])    Rx / DC Orders ED Discharge Orders    None       Lucrezia Starch, MD 10/30/20  1952

## 2020-10-30 NOTE — Progress Notes (Signed)
Mike [name] is a 59 y.o. Caucasian male with CAD, culprit (RCA) and nonculprit (LCx) PCI after STEMI 07/2020, HFrEF, moderate MR, h/o cardiac arrest 2/2 Torsades post PCI during index hospitalization, PAD with critical limb ischemia s/p Rt iliac revascularization, residual critical limb ischemia LLE with no immediate targets for revascularization, severe bilateral renal artery stenoses, acute kidney injury following peripheral arteriogram on  10/16/2020.    I have been following the patient closely in the outpatient setting with serial BUN/Cr checks. At last visit on 10/26/2020, he was relatively euvolumic. Cr had trended down from, 6.3 to 5.3, although BUN remained elevated. He has had persistent unresolved ST elevations in inferior leads since his STEMI in 07/2020, even without chest pain. I had felt that these were due to aneurysmal changes.    Today, the patient developed severe shortness of breath, inability to walk due to severe leg pain, and subsequently also developed 3/10 chest pain. He presented to [facility] emergency department.   On exam, he had clear lungs, no JVD, trace bilateral leg edema but did have mild hepatomegaly.  I did not think that he was in acute decompensated heart failure as chest x-ray was also fairly clear.  However due to known severe LV systolic dysfunction, critical limb ischemia, no revascularization options at this point (vascular consult obtained on 10/17/2020).  Unfortunately he has extreme high risk for limb loss and if he indeed need to have amputation, it would be above-knee amputation on the left with significant debility.  In view of underlying stage IV-V chronic kidney disease, chronic systolic heart failure and ischemic cardiomyopathy with severe LV systolic dysfunction, he would be extremely high risk for mortality.  Would recommend palliative care consult and pain control.  We will discuss in the morning with the patient and his family regarding goals of  therapy going forward. He is presently DNR per Dr. Virgina Jock. We will see him again tomorrow and plan to continue to coordinate the care.   Adrian Prows, MD, [facility] 10/30/2020, 8:26 PM Office: [phone] Pager: [phone]

## 2020-10-30 NOTE — Telephone Encounter (Signed)
Patient's sister called I went ahead and gave her the recommendations you recommended she is going to take patient to get his labs redone either today or tomorrow and patient has an appt scheduled with you this upcoming Thursday.

## 2020-10-30 NOTE — Telephone Encounter (Signed)
Yes. I will place a BMP order.

## 2020-10-30 NOTE — ED Triage Notes (Signed)
Arrived via EMS; c/o chest pain. Reported MI 3 months ago and has an external defibrillator. EMS endorsed ASA 324 mg given PTA and wife medicated patient w/ percocet. Patient stated 3-4 pain scale but complaining of difficulty breathing

## 2020-10-30 NOTE — Telephone Encounter (Signed)
Pt sister called in regard to him being in a lot of pain and with SOB for the past hour. Pt sister would like to know what should he do

## 2020-10-30 NOTE — ED Notes (Signed)
Provider at bedside

## 2020-10-30 NOTE — ED Notes (Signed)
Cardiologist at bedside.  

## 2020-10-30 NOTE — H&P (Signed)
Mike [name] BDZ:[id number] DOB: Apr 17, 1961 DOA: 10/30/2020     PCP: Patient, No Pcp Per   Outpatient Specialists:   CARDS:   Dr. Virgina Jock   Patient arrived to ER on 10/30/20 at 1635 Referred by Attending Lucrezia Starch, MD   Patient coming from: home Lives alone,      Chief Complaint  Chief Complaint  Patient presents with  . Chest Pain  leg pain  HPI: Mike [name] is a 59 y.o. male with medical history significant of CAD, culprit (RCA) and nonculprit (LCx) PCI after STEMI 07/2020, HFrEF, moderate MR, h/o cardiac arrest 2/2 Torsades post PPCI during index hospitalization, PAD with critical limb ischemia CKD, TOBACCO ABUSE, chronic severe claudication  Presented with     Chest pain and left leg pain Chest pain coming and going Leg pain is sharp and stubbing He has known left femoral  artery disease Spoke with  Vascular surgery in the past and he needs amputation AKA  but surgery feels not a good candidate due to very high risk due to severe cardiac disease as well as CKD Needs palliative care consult and pain management as per cardiology   Infectious risk factors:  Reports shortness of breath,     Has   been vaccinated against COVID    Initial COVID TEST   in house  PCR testing  Pending  Lab Results  Component Value Date   [place] 10/12/2020   [place] NEGATIVE 08/02/2020    Regarding pertinent Chronic problems:     Hyperlipidemia - on statins Crestor Lipid Panel     Component Value Date/Time   CHOL 95 (L) 09/11/2020 0806   TRIG 76 09/11/2020 0806   HDL 30 (L) 09/11/2020 0806   CHOLHDL 3.2 09/11/2020 0806   CHOLHDL 4.5 08/02/2020 0707   VLDL 15 08/02/2020 0707   LDLCALC 49 09/11/2020 0806   LABVLDL 16 09/11/2020 0806     chronic CHF diastolic/systolic/ combined - last echo August 2021 EF 34-19% Grade II diastolic dysfunction  On lasix  CAD  - On Aspirin, statin, betablocker, brilinta                -  followed by  cardiology    PAD severe not a surgical candidate     CKD stage IV- baseline Cr 5 Estimated Creatinine Clearance: 16.6 mL/min (A) (by C-G formula based on SCr of 4.62 mg/dL (H)).  Lab Results  Component Value Date   CREATININE 4.62 (H) 10/30/2020   CREATININE 5.36 (H) 10/26/2020   CREATININE 6.34 (H) 10/22/2020       While in ER: Noted to have a white cell count 21.9 Troponin elevated 364  ER Provider Called:  Dr. Einar Gip    They Recommend admit to medicine recommends pain control and supportive measures for tonight  seen in ER  Hospitalist was called for admission for chest pain and left leg claudication  The following Work up has been ordered so far:  Orders Placed This Encounter  Procedures  . Critical Care  . Respiratory Panel by RT PCR (Flu A&B, Covid) - Nasopharyngeal Swab  . DG Chest Portable 1 View  . Basic metabolic panel  . CBC  . Brain natriuretic peptide  . Protime-INR  . APTT  . Document Height and Actual Weight  . Inpatient consult to Cardiology  ALL PATIENTS BEING ADMITTED/HAVING PROCEDURES NEED COVID-19 SCREENING  . Consult to hospitalist  ALL PATIENTS BEING ADMITTED/HAVING PROCEDURES NEED COVID-19 SCREENING  .  EKG 12-Lead  . ED EKG  . EKG 12-Lead  . EKG 12-Lead    Following Medications were ordered in ER: Medications  nitroGLYCERIN (NITROSTAT) SL tablet 0.4 mg (has no administration in time range)  aspirin chewable tablet 324 mg (324 mg Oral Given 10/30/20 1825)  fentaNYL (SUBLIMAZE) injection 50 mcg (50 mcg Intravenous Given 10/30/20 1825)  fentaNYL (SUBLIMAZE) injection 50 mcg (50 mcg Intravenous Given 10/30/20 1947)        Consult Orders  (From admission, onward)         Start     Ordered   10/30/20 1918  Consult to hospitalist  ALL PATIENTS BEING ADMITTED/HAVING PROCEDURES NEED COVID-19 SCREENING  Once       Comments: ALL PATIENTS BEING ADMITTED/HAVING PROCEDURES NEED COVID-19 SCREENING  Provider:  (Not yet assigned)  Question Answer  Comment  Place call to: [facility]   Reason for Consult Admit      10/30/20 1917          Significant initial  Findings: Abnormal Labs Reviewed  BASIC METABOLIC PANEL - Abnormal; Notable for the following components:      Result Value   CO2 11 (*)    Glucose, Bld 205 (*)    BUN 104 (*)    Creatinine, Ser 4.62 (*)    GFR, Estimated 14 (*)    Anion gap 23 (*)    All other components within normal limits  CBC - Abnormal; Notable for the following components:   WBC 21.9 (*)    Hemoglobin 12.2 (*)    HCT 37.3 (*)    RDW 16.8 (*)    All other components within normal limits  BRAIN NATRIURETIC PEPTIDE - Abnormal; Notable for the following components:   B Natriuretic Peptide 488.4 (*)    All other components within normal limits  TROPONIN I (HIGH SENSITIVITY) - Abnormal; Notable for the following components:   Troponin I (High Sensitivity) 364 (*)    All other components within normal limits  TROPONIN I (HIGH SENSITIVITY) - Abnormal; Notable for the following components:   Troponin I (High Sensitivity) 322 (*)    All other components within normal limits   Otherwise labs showing:   Recent Labs  Lab 10/26/20 1004 10/30/20 1652  NA 135 136  K 3.9 4.1  CO2 11* 11*  GLUCOSE 210* 205*  BUN 79* 104*  CREATININE 5.36* 4.62*  CALCIUM 9.1 9.3    Cr down from baseline see below Lab Results  Component Value Date   CREATININE 4.62 (H) 10/30/2020   CREATININE 5.36 (H) 10/26/2020   CREATININE 6.34 (H) 10/22/2020    No results for input(s): AST, ALT, ALKPHOS, BILITOT, PROT, ALBUMIN in the last 168 hours. Lab Results  Component Value Date   CALCIUM 9.3 10/30/2020     WBC      Component Value Date/Time   WBC 21.9 (H) 10/30/2020 1652   LYMPHSABS 1.5 08/02/2020 0322   MONOABS 1.2 (H) 08/02/2020 0322   EOSABS 0.0 08/02/2020 0322   BASOSABS 0.0 08/02/2020 0322    Plt: Lab Results  Component Value Date   PLT 379 10/30/2020    Lactic Acid, Venous    Component  Value Date/Time   LATICACIDVEN 1.5 10/30/2020 2141    Procalcitonin  Ordered   COVID-19 Labs  No results for input(s): DDIMER, FERRITIN, LDH, CRP in the last 72 hours.  Lab Results  Component Value Date   [place] NEGATIVE 10/12/2020   [employer] NEGATIVE 08/02/2020  HG/HCT  stable,      Component Value Date/Time   HGB 12.2 (L) 10/30/2020 1652   HCT 37.3 (L) 10/30/2020 1652   MCV 85.4 10/30/2020 1652   MCV 85.0 03/16/2016 1642      Troponin 322 - 364 Cardiac Panel (last 3 results) No results for input(s): CKTOTAL, CKMB, TROPONINI, RELINDX in the last 72 hours.   ECG: Ordered Personally reviewed by me showing: HR : 97 Rhythm NSR,   nonspecific changes, as per cardiology unchanged  QTC 469  BNP (last 3 results) Recent Labs    08/03/20 0644 10/22/20 1054 10/30/20 1820  BNP 751.4* 261.5* 488.4*     DM  labs:  HbA1C: Recent Labs    08/02/20 0322  HGBA1C 5.8*     CBG (last 3)  No results for input(s): GLUCAP in the last 72 hours.     UA  ordered      CXR -  NON acute     ED Triage Vitals  Enc Vitals Group     BP 10/30/20 1642 110/81     Pulse Rate 10/30/20 1642 (!) 103     Resp 10/30/20 1642 (!) 29     Temp 10/30/20 1642 (!) 97.4 F (36.3 C)     Temp Source 10/30/20 1642 Oral     SpO2 10/30/20 1642 96 %     Weight --      Height --      Head Circumference --      Peak Flow --      Pain Score 10/30/20 1643 4     Pain Loc --      Pain Edu? --      Excl. in [place]? --   TMAX(24)@       Latest  Blood pressure 108/84, pulse 80, temperature (!) 97.4 F (36.3 C), temperature source Oral, resp. rate (!) 25, SpO2 97 %.    Review of Systems:    Pertinent positives include:   chills, fatigue, chest pain, leg pain  Constitutional:  No weight loss, night sweats, Fevers,  weight loss  HEENT:  No headaches, Difficulty swallowing,Tooth/dental problems,Sore throat,  No sneezing, itching, ear ache, nasal congestion, post nasal drip,  Cardio-vascular:   No , Orthopnea, PND, anasarca, dizziness, palpitations.no Bilateral lower extremity swelling  GI:  No heartburn, indigestion, abdominal pain, nausea, vomiting, diarrhea, change in bowel habits, loss of appetite, melena, blood in stool, hematemesis Resp:  no shortness of breath at rest. No dyspnea on exertion, No excess mucus, no productive cough, No non-productive cough, No coughing up of blood.No change in color of mucus.No wheezing. Skin:  no rash or lesions. No jaundice GU:  no dysuria, change in color of urine, no urgency or frequency. No straining to urinate.  No flank pain.  Musculoskeletal:  No joint pain or no joint swelling. No decreased range of motion. No back pain.  Psych:  No change in mood or affect. No depression or anxiety. No memory loss.  Neuro: no localizing neurological complaints, no tingling, no weakness, no double vision, no gait abnormality, no slurred speech, no confusion  All systems reviewed and apart from [place] all are negative  Past Medical History:   Past Medical History:  Diagnosis Date  . CAD (coronary artery disease)   . Heart attack ([place])   . Hyperlipidemia   . Hypertension       Past Surgical History:  Procedure Laterality Date  . ABDOMINAL AORTOGRAM W/LOWER EXTREMITY N/A 10/16/2020   Procedure:  ABDOMINAL AORTOGRAM W/LOWER EXTREMITY;  Surgeon: Nigel Mormon, MD;  Location: [facility];  Service: Cardiovascular;  Laterality: N/A;  . CORONARY STENT INTERVENTION N/A 08/03/2020   Procedure: CORONARY STENT INTERVENTION;  Surgeon: Nigel Mormon, MD;  Location: [facility];  Service: Cardiovascular;  Laterality: N/A;  . CORONARY/GRAFT ACUTE MI REVASCULARIZATION N/A 08/01/2020   Procedure: Coronary/Graft Acute MI Revascularization;  Surgeon: Nigel Mormon, MD;  Location: [facility];  Service: Cardiovascular;  Laterality: N/A;  . INTRAVASCULAR LITHOTRIPSY Right 10/16/2020   Procedure: INTRAVASCULAR LITHOTRIPSY;   Surgeon: Nigel Mormon, MD;  Location: [facility];  Service: Cardiovascular;  Laterality: Right;  Common and External Iliac  . LEFT HEART CATH AND CORONARY ANGIOGRAPHY N/A 08/01/2020   Procedure: LEFT HEART CATH AND CORONARY ANGIOGRAPHY;  Surgeon: Nigel Mormon, MD;  Location: [facility];  Service: Cardiovascular;  Laterality: N/A;  . LEFT HEART CATH AND CORONARY ANGIOGRAPHY N/A 08/03/2020   Procedure: LEFT HEART CATH AND CORONARY ANGIOGRAPHY;  Surgeon: Nigel Mormon, MD;  Location: [facility];  Service: Cardiovascular;  Laterality: N/A;  . PERIPHERAL VASCULAR INTERVENTION Right 10/16/2020   Procedure: PERIPHERAL VASCULAR INTERVENTION;  Surgeon: Nigel Mormon, MD;  Location: [facility];  Service: Cardiovascular;  Laterality: Right;  Common and Iliac Stent    Social History:  Ambulatory   Independently      reports that he has been smoking cigarettes. He has a 20.00 pack-year smoking history. He has never used smokeless tobacco. He reports current alcohol use of about 10.0 standard drinks of alcohol per week. He reports that he does not use drugs.  Family History:   Family History  Problem Relation Age of Onset  . Heart disease Mother   . Heart disease Father   . Cancer Brother     Allergies: No Known Allergies   Prior to Admission medications   Medication Sig Start Date End Date Taking? Authorizing Provider  aspirin 81 MG EC tablet Take 1 tablet (81 mg total) by mouth daily. Swallow whole. 08/14/20   Patwardhan, Reynold Bowen, MD  furosemide (LASIX) 20 MG tablet Take 1 tablet (20 mg total) by mouth daily as needed. 10/18/20 01/16/21  Patwardhan, Reynold Bowen, MD  isosorbide-hydrALAZINE (BIDIL) 20-37.5 MG tablet Take 0.5 tablets by mouth 3 (three) times daily. 10/22/20   Patwardhan, Reynold Bowen, MD  ivabradine (CORLANOR) 5 MG TABS tablet Take 5 mg by mouth 2 (two) times daily with a meal.    [provider]  nicotine (NICODERM CQ -  DOSED IN MG/24 HR) 7 mg/24hr patch Place 1 patch (7 mg total) onto the skin daily. 08/04/20 08/04/21  Patwardhan, Reynold Bowen, MD  nitroGLYCERIN (NITROSTAT) 0.4 MG SL tablet Place 1 tablet (0.4 mg total) under the tongue every 5 (five) minutes as needed for chest pain. 08/14/20 11/12/20  Patwardhan, Reynold Bowen, MD  oxyCODONE-acetaminophen (PERCOCET) 5-325 MG tablet Take 1 tablet by mouth every 8 (eight) hours as needed for severe pain. 10/18/20   Patwardhan, Reynold Bowen, MD  rosuvastatin (CRESTOR) 40 MG tablet Take 1 tablet (40 mg total) by mouth daily. 10/04/20   Patwardhan, Reynold Bowen, MD  ticagrelor (BRILINTA) 90 MG TABS tablet Take 1 tablet (90 mg total) by mouth 2 (two) times daily. 08/14/20   Nigel Mormon, MD   Physical Exam: Vitals with BMI 10/30/2020 10/30/2020 10/30/2020  Height - - -  Weight - - -  BMI - - -  Systolic 378 588 502  Diastolic 84 76 69  Pulse 80 86 89     1. General:  in No  Acute distress   Chronically ill  -appearing 2. Psychological: Alert and  Oriented 3. Head/ENT:   Dry Mucous Membranes                          Head Non traumatic, neck supple                        Poor Dentition 4. SKIN:   decreased Skin turgor,  Skin clean Dry and intact no rash 5. Heart: Regular rate and rhythm no  Murmur, no Rub or gallop 6. Lungs:  no wheezes or crackles   7. Abdomen: Soft, non-tender, Non distended bowel sounds present 8. Lower extremities: no clubbing, cyanosis, no  Edema LEft leg diminished pulses and cooler to the touch, dry gangrene of 2nd toe present 9. Neurologically Grossly intact, moving all 4 extremities equally  10. MSK: Normal range of motion   All other LABS:     Recent Labs  Lab 10/30/20 1652  WBC 21.9*  HGB 12.2*  HCT 37.3*  MCV 85.4  PLT 379     Recent Labs  Lab 10/26/20 1004 10/30/20 1652  NA 135 136  K 3.9 4.1  CL 98 102  CO2 11* 11*  GLUCOSE 210* 205*  BUN 79* 104*  CREATININE 5.36* 4.62*  CALCIUM 9.1 9.3     No results for input(s):  AST, ALT, ALKPHOS, BILITOT, PROT, ALBUMIN in the last 168 hours.     Cultures: No results found for: [place], [place], CULT, REPTSTATUS   Radiological Exams on Admission: DG Chest Portable 1 View  Result Date: 10/30/2020 CLINICAL DATA:  Chest pain EXAM: PORTABLE CHEST 1 VIEW COMPARISON:  08/03/2020 FINDINGS: Single frontal view of the chest demonstrates stenting artifact from external cardiac device. Cardiac silhouette is unremarkable. No airspace disease, effusion, or pneumothorax. No acute bony abnormalities. IMPRESSION: 1. No acute intrathoracic process. Electronically Signed   By: Randa Ngo M.D.   On: 10/30/2020 17:01    Chart has been reviewed    Assessment/Plan  59 y.o. male with medical history significant of CAD, culprit (RCA) and nonculprit (LCx) PCI after STEMI 07/2020, HFrEF, moderate MR, h/o cardiac arrest 2/2 Torsades post PPCI during index hospitalization, PAD with critical limb ischemia CKD   Admitted for Chest pain and PAD with limb pain  Present on Admission: . Limb ischemia/ Rest pain of left upper extremity due to atherosclerosis (HCC)  -chronic.  Would benefit from vascular surgery consult in the morning to provide recommendations regarding options for care. For now order pain medications for pain control  . Tobacco dependence -  - Spoke about importance of quitting spent 5 minutes discussing options for treatment, prior attempts at quitting, and dangers of smoking  -At this point patient is    interested in quitting  - order nicotine patch   - nursing tobacco cessation protocol     . Mixed hyperlipidemia continue statin   . Coronary artery disease involving native coronary artery of native heart without angina pectoris -continue statin beta-blocker holding parameters aspirin and Brilinta appreciate cardiology input Patient has chronic recurrent chest pain and chronic EKG changes troponin stable continue to monitor  CKD stage IV -avoid nephrotoxic  medications avoid hypotension currently renal function appears to be stable  . Hyperglycemia -no prior history of diabetes,  monitor blood sugars check hemoglobin A1c order sliding scale while hospitalized, what a diabetic care coordinator consult patient may be newly diabetic  History of cardiomyopathy with chronic combined diastolic/systolic CHF -currently appears to be euvolemic if somewhat soft blood pressures hold Lasix for tonight  Leukocytosis -no fever or other source of infection at this time.  Monitor for any sign of infection could be reactive in the setting of chronic wound ischemia and dry gangrene  Other plan as per orders.  DVT prophylaxis:    Lovenox       Code Status:    Code Status: Prior limited code, DNI as per patient   I had personally discussed CODE STATUS with patient    Family Communication:   Family not at  Bedside    Disposition Plan:       To home once workup is complete and patient is stable   Following barriers for discharge:                                                       Pain controlled with PO medications                                , white count improving                              Will need to be able to tolerate PO                            Will likely need home health                            Will need consultants to evaluate patient prior to discharge                                        Diabetes care coordinator                   Transition of care consulted                                     Consults called: Cardiology is aware  Admission status:  ED Disposition    ED Disposition Condition [place]: [place] [[id number]]  Level of Care: Telemetry Cardiac [103]  May admit patient to [facility] or [facility] if equivalent level of care is available:: No  Covid Evaluation: Symptomatic Person Under Investigation (PUI)  Diagnosis: Limb ischemia [[id number]]  Admitting Physician: Toy Baker [[id number]]  Attending Physician: Toy Baker [[id number]]  Estimated length of stay: past midnight tomorrow  Certification:: I certify this patient will need inpatient services for at least 2 midnights         inpatient     I Expect 2 midnight stay secondary to severity of patient's current illness need for inpatient interventions justified by the following:  elevated troponin and extensive comorbidities  including:  Chronic pain  CHF  CAD  CKD   That are currently affecting medical management.   I expect  patient to be hospitalized for 2 midnights requiring inpatient medical care.  Patient is at high risk for adverse outcome (such as loss of life or disability) if not treated.  Indication for inpatient stay as follows:    severe pain requiring acute inpatient management,     persistent chest pain despite medical management Need for operative/procedural  intervention     Level of care     tele  For  24H      Lab Results  Component Value Date   [place] 10/12/2020     Precautions: admitted as  PUI    PPE: Used by the provider:   P100  eye Goggles,  Gloves      Shamere Dilworth 10/30/2020, 10:44 PM    [facility]     after 2 AM please page floor coverage PA If 7AM-7PM, please contact the day team taking care of the patient using [email or web address]   Patient was evaluated in the context of the global COVID-19 pandemic, which necessitated consideration that the patient might be at risk for infection with the SARS-CoV-2 virus that causes COVID-19. Institutional protocols and algorithms that pertain to the evaluation of patients at risk for COVID-19 are in a state of rapid change based on information released by regulatory bodies including the CDC and federal and state organizations. These policies and algorithms were followed during the patient's care.

## 2020-10-31 DIAGNOSIS — Z7189 Other specified counseling: Secondary | ICD-10-CM | POA: Diagnosis not present

## 2020-10-31 DIAGNOSIS — I5023 Acute on chronic systolic (congestive) heart failure: Secondary | ICD-10-CM | POA: Diagnosis not present

## 2020-10-31 DIAGNOSIS — I70229 Atherosclerosis of native arteries of extremities with rest pain, unspecified extremity: Secondary | ICD-10-CM | POA: Diagnosis not present

## 2020-10-31 DIAGNOSIS — Z66 Do not resuscitate: Secondary | ICD-10-CM

## 2020-10-31 DIAGNOSIS — D72829 Elevated white blood cell count, unspecified: Secondary | ICD-10-CM | POA: Diagnosis not present

## 2020-10-31 DIAGNOSIS — I70222 Atherosclerosis of native arteries of extremities with rest pain, left leg: Secondary | ICD-10-CM

## 2020-10-31 DIAGNOSIS — I2510 Atherosclerotic heart disease of native coronary artery without angina pectoris: Secondary | ICD-10-CM

## 2020-10-31 DIAGNOSIS — R079 Chest pain, unspecified: Secondary | ICD-10-CM | POA: Diagnosis present

## 2020-10-31 DIAGNOSIS — Z515 Encounter for palliative care: Secondary | ICD-10-CM | POA: Diagnosis not present

## 2020-10-31 DIAGNOSIS — N185 Chronic kidney disease, stage 5: Secondary | ICD-10-CM

## 2020-10-31 LAB — RAPID URINE DRUG SCREEN, HOSP PERFORMED
Amphetamines: NOT DETECTED
Barbiturates: NOT DETECTED
Benzodiazepines: POSITIVE — AB
Cocaine: NOT DETECTED
Opiates: NOT DETECTED
Tetrahydrocannabinol: NOT DETECTED

## 2020-10-31 LAB — URINALYSIS, ROUTINE W REFLEX MICROSCOPIC
Bilirubin Urine: NEGATIVE
Glucose, UA: NEGATIVE mg/dL
Ketones, ur: NEGATIVE mg/dL
Leukocytes,Ua: NEGATIVE
Nitrite: NEGATIVE
Protein, ur: 30 mg/dL — AB
Specific Gravity, Urine: 1.015 (ref 1.005–1.030)
pH: 5 (ref 5.0–8.0)

## 2020-10-31 LAB — CBC WITH DIFFERENTIAL/PLATELET
Abs Immature Granulocytes: 0.16 10*3/uL — ABNORMAL HIGH (ref 0.00–0.07)
Basophils Absolute: 0 10*3/uL (ref 0.0–0.1)
Basophils Relative: 0 %
Eosinophils Absolute: 0.2 10*3/uL (ref 0.0–0.5)
Eosinophils Relative: 1 %
HCT: 34.6 % — ABNORMAL LOW (ref 39.0–52.0)
Hemoglobin: 11.4 g/dL — ABNORMAL LOW (ref 13.0–17.0)
Immature Granulocytes: 1 %
Lymphocytes Relative: 5 %
Lymphs Abs: 1 10*3/uL (ref 0.7–4.0)
MCH: 27.6 pg (ref 26.0–34.0)
MCHC: 32.9 g/dL (ref 30.0–36.0)
MCV: 83.8 fL (ref 80.0–100.0)
Monocytes Absolute: 1.1 10*3/uL — ABNORMAL HIGH (ref 0.1–1.0)
Monocytes Relative: 6 %
Neutro Abs: 17 10*3/uL — ABNORMAL HIGH (ref 1.7–7.7)
Neutrophils Relative %: 87 %
Platelets: 296 10*3/uL (ref 150–400)
RBC: 4.13 MIL/uL — ABNORMAL LOW (ref 4.22–5.81)
RDW: 16.6 % — ABNORMAL HIGH (ref 11.5–15.5)
WBC: 19.5 10*3/uL — ABNORMAL HIGH (ref 4.0–10.5)
nRBC: 0 % (ref 0.0–0.2)

## 2020-10-31 LAB — HEPATIC FUNCTION PANEL
ALT: 123 U/L — ABNORMAL HIGH (ref 0–44)
AST: 235 U/L — ABNORMAL HIGH (ref 15–41)
Albumin: 2.5 g/dL — ABNORMAL LOW (ref 3.5–5.0)
Alkaline Phosphatase: 81 U/L (ref 38–126)
Bilirubin, Direct: 0.2 mg/dL (ref 0.0–0.2)
Indirect Bilirubin: 0.7 mg/dL (ref 0.3–0.9)
Total Bilirubin: 0.9 mg/dL (ref 0.3–1.2)
Total Protein: 7.2 g/dL (ref 6.5–8.1)

## 2020-10-31 LAB — LACTIC ACID, PLASMA: Lactic Acid, Venous: 1.3 mmol/L (ref 0.5–1.9)

## 2020-10-31 LAB — BASIC METABOLIC PANEL
Anion gap: 18 — ABNORMAL HIGH (ref 5–15)
BUN: 113 mg/dL — ABNORMAL HIGH (ref 8–23)
CO2: 11 mmol/L — ABNORMAL LOW (ref 22–32)
Calcium: 8.9 mg/dL (ref 8.9–10.3)
Chloride: 103 mmol/L (ref 98–111)
Creatinine, Ser: 4.26 mg/dL — ABNORMAL HIGH (ref 0.61–1.24)
GFR, Estimated: 15 mL/min — ABNORMAL LOW (ref >60)
Glucose, Bld: 111 mg/dL — ABNORMAL HIGH (ref 70–99)
Potassium: 3.5 mmol/L (ref 3.5–5.1)
Sodium: 132 mmol/L — ABNORMAL LOW (ref 135–145)

## 2020-10-31 LAB — PHOSPHORUS: Phosphorus: 6.5 mg/dL — ABNORMAL HIGH (ref 2.5–4.6)

## 2020-10-31 LAB — GLUCOSE, CAPILLARY
Glucose-Capillary: 104 mg/dL — ABNORMAL HIGH (ref 70–99)
Glucose-Capillary: 102 mg/dL — ABNORMAL HIGH (ref 70–99)
Glucose-Capillary: 82 mg/dL (ref 70–99)
Glucose-Capillary: 100 mg/dL — ABNORMAL HIGH (ref 70–99)
Glucose-Capillary: 166 mg/dL — ABNORMAL HIGH (ref 70–99)

## 2020-10-31 LAB — HIV ANTIBODY (ROUTINE TESTING W REFLEX): HIV Screen 4th Generation wRfx: NONREACTIVE

## 2020-10-31 LAB — HEMOGLOBIN A1C
Hgb A1c MFr Bld: 7.1 % — ABNORMAL HIGH (ref 4.8–5.6)
Mean Plasma Glucose: 157.07 mg/dL

## 2020-10-31 LAB — MAGNESIUM: Magnesium: 2.4 mg/dL (ref 1.7–2.4)

## 2020-10-31 LAB — TSH: TSH: 10.137 u[IU]/mL — ABNORMAL HIGH (ref 0.350–4.500)

## 2020-10-31 MED ORDER — FENTANYL CITRATE (PF) 100 MCG/2ML IJ SOLN
50.0000 ug | INTRAMUSCULAR | Status: DC | PRN
  Administered 2020-10-31 – 2020-11-01 (×7): 50 ug via INTRAVENOUS
  Filled 2020-10-31 (×7): qty 2

## 2020-10-31 MED ORDER — TICAGRELOR 90 MG PO TABS
90.0000 mg | ORAL_TABLET | Freq: Two times a day (BID) | ORAL | Status: DC
  Administered 2020-10-31 (×3): 90 mg via ORAL
  Filled 2020-10-31 (×3): qty 1

## 2020-10-31 MED ORDER — OXYCODONE-ACETAMINOPHEN 5-325 MG PO TABS
1.0000 | ORAL_TABLET | Freq: Three times a day (TID) | ORAL | Status: DC | PRN
  Administered 2020-10-31 – 2020-11-02 (×6): 1 via ORAL
  Filled 2020-10-31 (×7): qty 1

## 2020-10-31 MED ORDER — IVABRADINE HCL 5 MG PO TABS
2.5000 mg | ORAL_TABLET | Freq: Two times a day (BID) | ORAL | Status: DC
  Administered 2020-11-03 – 2020-11-11 (×12): 2.5 mg via ORAL
  Filled 2020-10-31 (×26): qty 1

## 2020-10-31 MED ORDER — ASPIRIN EC 81 MG PO TBEC
81.0000 mg | DELAYED_RELEASE_TABLET | Freq: Every day | ORAL | Status: DC
  Administered 2020-10-31 – 2020-11-15 (×15): 81 mg via ORAL
  Filled 2020-10-31 (×16): qty 1

## 2020-10-31 MED ORDER — GERHARDT'S BUTT CREAM
TOPICAL_CREAM | Freq: Two times a day (BID) | CUTANEOUS | Status: DC
  Administered 2020-11-07 – 2020-11-12 (×5): 1 via TOPICAL
  Filled 2020-10-31 (×4): qty 1

## 2020-10-31 MED ORDER — HEPARIN SODIUM (PORCINE) 5000 UNIT/ML IJ SOLN
5000.0000 [IU] | Freq: Three times a day (TID) | INTRAMUSCULAR | Status: DC
  Administered 2020-10-31 – 2020-11-09 (×27): 5000 [IU] via SUBCUTANEOUS
  Filled 2020-10-31 (×28): qty 1

## 2020-10-31 MED ORDER — NICOTINE 7 MG/24HR TD PT24
7.0000 mg | MEDICATED_PATCH | TRANSDERMAL | Status: DC
  Administered 2020-10-31 – 2020-11-11 (×3): 7 mg via TRANSDERMAL
  Filled 2020-10-31 (×17): qty 1

## 2020-10-31 MED ORDER — ACETAMINOPHEN 650 MG RE SUPP: 650.0000 mg | Freq: Four times a day (QID) | RECTAL | Status: DC | PRN

## 2020-10-31 MED ORDER — FENTANYL CITRATE (PF) 100 MCG/2ML IJ SOLN
12.5000 ug | INTRAMUSCULAR | Status: DC | PRN
  Administered 2020-10-31: 25 ug via INTRAVENOUS
  Filled 2020-10-31: qty 2

## 2020-10-31 MED ORDER — LIVING WELL WITH DIABETES BOOK
Freq: Once | Status: AC
  Filled 2020-10-31: qty 1

## 2020-10-31 MED ORDER — ROSUVASTATIN CALCIUM 20 MG PO TABS
40.0000 mg | ORAL_TABLET | Freq: Every day | ORAL | Status: DC
  Administered 2020-10-31: 40 mg via ORAL
  Filled 2020-10-31: qty 2

## 2020-10-31 MED ORDER — ACETAMINOPHEN 325 MG PO TABS
650.0000 mg | ORAL_TABLET | Freq: Four times a day (QID) | ORAL | Status: DC | PRN
  Administered 2020-11-10 – 2020-11-12 (×3): 650 mg via ORAL
  Filled 2020-10-31 (×3): qty 2

## 2020-10-31 NOTE — Consult Note (Signed)
CARDIOLOGY CONSULT NOTE  Patient ID: Mike [name] MRN: [id number] DOB/AGE: [birth year] 59 y.o.  Admit date: 10/30/2020 Referring Physician: [facility] Reason for Consultation:  Shortness of breath  HPI:   Mike [name] is a 59 y.o. Caucasian male with CAD, culprit (RCA) and nonculprit (LCx) PCI after STEMI 07/2020, HFrEF, moderate MR, h/o cardiac arrest 2/2 Torsades post PCI during index hospitalization, PAD with critical limb ischemia s/p Rt iliac revascularization, residual critical limb ischemia LLE with no immediate targets for revascularization, severe bilateral renal artery stenoses, acute kidney injury following peripheral arteriogram on  10/16/2020.    I have been following the patient closely in the outpatient setting with serial BUN/Cr checks. At last visit on 10/26/2020, he was relatively euvolumic. Cr had trended down from, 6.3 to 5.3, although BUN remained elevated. He has had persistent unresolved ST elevations in inferior leads since his STEMI in 07/2020, even without chest pain. I had felt that these were due to aneurysmal changes.    Today, the patient developed severe shortness of breath, inability to walk due to severe leg pain, and subsequently also developed 3/10 chest pain. He presented to [facility] emergency department. Chest pain since resolved. Patient was very short of breath on admission, but chest Xray did not show any acute process, O2 sats were normal. Trop mildly elevated but flat. AST/ALT elevated, albumin reduced. Cr elevated but trending down. Wbc count elevated at 22k.   Patient continues to have severe resting pain in left foot.   Past Medical History:  Diagnosis Date  . CAD (coronary artery disease)   . Heart attack ([place])   . Hyperlipidemia   . Hypertension      Past Surgical History:  Procedure Laterality Date  . ABDOMINAL AORTOGRAM W/LOWER EXTREMITY N/A 10/16/2020   Procedure: ABDOMINAL AORTOGRAM W/LOWER EXTREMITY;   Surgeon: Nigel Mormon, MD;  Location: [place] [facility];  Service: Cardiovascular;  Laterality: N/A;  . CORONARY STENT INTERVENTION N/A 08/03/2020   Procedure: CORONARY STENT INTERVENTION;  Surgeon: Nigel Mormon, MD;  Location: [place] [facility];  Service: Cardiovascular;  Laterality: N/A;  . CORONARY/GRAFT ACUTE MI REVASCULARIZATION N/A 08/01/2020   Procedure: Coronary/Graft Acute MI Revascularization;  Surgeon: Nigel Mormon, MD;  Location: [place] [facility];  Service: Cardiovascular;  Laterality: N/A;  . INTRAVASCULAR LITHOTRIPSY Right 10/16/2020   Procedure: INTRAVASCULAR LITHOTRIPSY;  Surgeon: Nigel Mormon, MD;  Location: [place] [facility];  Service: Cardiovascular;  Laterality: Right;  Common and External Iliac  . LEFT HEART CATH AND CORONARY ANGIOGRAPHY N/A 08/01/2020   Procedure: LEFT HEART CATH AND CORONARY ANGIOGRAPHY;  Surgeon: Nigel Mormon, MD;  Location: [place] [facility];  Service: Cardiovascular;  Laterality: N/A;  . LEFT HEART CATH AND CORONARY ANGIOGRAPHY N/A 08/03/2020   Procedure: LEFT HEART CATH AND CORONARY ANGIOGRAPHY;  Surgeon: Nigel Mormon, MD;  Location: [place] [facility];  Service: Cardiovascular;  Laterality: N/A;  . PERIPHERAL VASCULAR INTERVENTION Right 10/16/2020   Procedure: PERIPHERAL VASCULAR INTERVENTION;  Surgeon: Nigel Mormon, MD;  Location: [place] [facility];  Service: Cardiovascular;  Laterality: Right;  Common and Iliac Stent      Family History  Problem Relation Age of Onset  . Heart disease Mother   . Heart disease Father   . Cancer Brother      Social History: Social History   Socioeconomic History  . Marital status: Single    Spouse name: Not on file  . Number  of children: 1  . Years of education: Not on file  . Highest education level: Not on file  Occupational History  . Not on file  Tobacco Use  . Smoking status: Current Some Day Smoker    Packs/day: 0.50    Years: 40.00    Pack  years: 20.00    Types: Cigarettes  . Smokeless tobacco: Never Used  Vaping Use  . Vaping Use: Never used  Substance and Sexual Activity  . Alcohol use: Yes    Alcohol/week: 10.0 standard drinks    Types: 10 Cans of beer per week    Comment: occ  . Drug use: Never  . Sexual activity: Not on file  Other Topics Concern  . Not on file  Social History Narrative  . Not on file   Social Determinants of Health   Financial Resource Strain:   . Difficulty of Paying Living Expenses: Not on file  Food Insecurity:   . Worried About Charity fundraiser in the Last Year: Not on file  . Ran Out of Food in the Last Year: Not on file  Transportation Needs:   . Lack of Transportation (Medical): Not on file  . Lack of Transportation (Non-Medical): Not on file  Physical Activity:   . Days of Exercise per Week: Not on file  . Minutes of Exercise per Session: Not on file  Stress:   . Feeling of Stress : Not on file  Social Connections:   . Frequency of Communication with Friends and Family: Not on file  . Frequency of Social Gatherings with Friends and Family: Not on file  . Attends Religious Services: Not on file  . Active Member of Clubs or Organizations: Not on file  . Attends Archivist Meetings: Not on file  . Marital Status: Not on file  Intimate Partner Violence:   . Fear of Current or Ex-Partner: Not on file  . Emotionally Abused: Not on file  . Physically Abused: Not on file  . Sexually Abused: Not on file     Facility-Administered Medications Prior to Admission  Medication Dose Route Frequency Provider Last Rate Last Admin  . ivabradine (CORLANOR) tablet 5 mg  5 mg Oral BID WC Jabrea Kallstrom J, MD       Medications Prior to Admission  Medication Sig Dispense Refill Last Dose  . aspirin 81 MG EC tablet Take 1 tablet (81 mg total) by mouth daily. Swallow whole. 90 tablet 3 10/30/2020 at 1000  . furosemide (LASIX) 20 MG tablet Take 1 tablet (20 mg total) by mouth  daily as needed. (Patient taking differently: Take 20 mg by mouth daily as needed for fluid or edema. ) 30 tablet 2 10/30/2020 at am  . isosorbide-hydrALAZINE (BIDIL) 20-37.5 MG tablet Take 0.5 tablets by mouth 3 (three) times daily. 30 tablet 2   . ivabradine (CORLANOR) 5 MG TABS tablet Take 5 mg by mouth 2 (two) times daily with a meal.     . nicotine (NICODERM CQ - DOSED IN MG/24 HR) 7 mg/24hr patch Place 1 patch (7 mg total) onto the skin daily. 30 patch 1   . nitroGLYCERIN (NITROSTAT) 0.4 MG SL tablet Place 1 tablet (0.4 mg total) under the tongue every 5 (five) minutes as needed for chest pain. 30 tablet 1   . oxyCODONE-acetaminophen (PERCOCET) 5-325 MG tablet Take 1 tablet by mouth every 8 (eight) hours as needed for severe pain. 60 tablet 0   . rosuvastatin (CRESTOR) 40 MG tablet  Take 1 tablet (40 mg total) by mouth daily. 90 tablet 3   . ticagrelor (BRILINTA) 90 MG TABS tablet Take 1 tablet (90 mg total) by mouth 2 (two) times daily. 180 tablet 3     Review of Systems  Constitutional: Negative for decreased appetite, malaise/fatigue, weight gain and weight loss.  HENT: Negative for congestion.   Eyes: Negative for visual disturbance.  Cardiovascular: Positive for claudication and dyspnea on exertion. Negative for chest pain, leg swelling, palpitations and syncope.       Resting left foot pain   Respiratory: Negative for cough.   Endocrine: Negative for cold intolerance.  Hematologic/Lymphatic: Does not bruise/bleed easily.  Skin: Negative for itching and rash.       Dry gangrene left foot second toe. Dusky appearance of all toes left foot as well as right foot  Musculoskeletal: Negative for myalgias.  Gastrointestinal: Negative for abdominal pain, nausea and vomiting.  Genitourinary: Negative for dysuria.  Neurological: Negative for dizziness and weakness.  Psychiatric/Behavioral: The patient is not nervous/anxious.   All other systems reviewed and are negative.     Physical  Exam: Physical Exam Vitals and nursing note reviewed.  Constitutional:      Appearance: He is well-developed.  HENT:     Head: Normocephalic and atraumatic.  Eyes:     Conjunctiva/sclera: Conjunctivae normal.     Pupils: Pupils are equal, round, and reactive to light.  Neck:     Vascular: No JVD.  Cardiovascular:     Rate and Rhythm: Normal rate and regular rhythm.     Pulses: Intact distal pulses.          Femoral pulses are 2+ on the right side and 0 on the left side.      Popliteal pulses are 1+ on the right side and 0 on the left side.       Dorsalis pedis pulses are 0 on the right side and 0 on the left side.       Posterior tibial pulses are 0 on the right side and 0 on the left side.     Heart sounds: No murmur heard.   Pulmonary:     Effort: Pulmonary effort is normal. Tachypnea (Mild) present.     Breath sounds: Normal breath sounds. No wheezing or rales.  Abdominal:     General: Bowel sounds are normal.     Palpations: Abdomen is soft.     Tenderness: There is no rebound.     Comments: Hepatomegaly  Musculoskeletal:        General: No tenderness. Normal range of motion.     Left lower leg: No edema.     Comments: Left thigh muscle wasting  Lymphadenopathy:     Cervical: No cervical adenopathy.  Skin:    General: Skin is warm and dry.  Neurological:     Mental Status: He is alert and oriented to person, place, and time.     Cranial Nerves: No cranial nerve deficit.      Labs:   Lab Results  Component Value Date   WBC 19.5 (H) 10/31/2020   HGB 11.4 (L) 10/31/2020   HCT 34.6 (L) 10/31/2020   MCV 83.8 10/31/2020   PLT 296 10/31/2020    Recent Labs  Lab 10/30/20 1652 10/31/20 0429  NA 136  --   K 4.1  --   CL 102  --   CO2 11*  --   BUN 104*  --   CREATININE 4.62*  --  CALCIUM 9.3  --   PROT  --  7.2  BILITOT  --  0.9  ALKPHOS  --  81  ALT  --  123*  AST  --  235*  GLUCOSE 205*  --     Lipid Panel     Component Value Date/Time   CHOL  95 (L) 09/11/2020 0806   TRIG 76 09/11/2020 0806   HDL 30 (L) 09/11/2020 0806   CHOLHDL 3.2 09/11/2020 0806   CHOLHDL 4.5 08/02/2020 0707   VLDL 15 08/02/2020 0707   LDLCALC 49 09/11/2020 0806    BNP (last 3 results) Recent Labs    08/03/20 0644 10/22/20 1054 10/30/20 1820  BNP 751.4* 261.5* 488.4*    HEMOGLOBIN A1C Lab Results  Component Value Date   HGBA1C 7.1 (H) 10/31/2020   MPG 157.07 10/31/2020    Cardiac Panel (last 3 results) Recent Labs    10/17/20 1413  CKTOTAL 486*    Lab Results  Component Value Date   CKTOTAL 486 (H) 10/17/2020     TSH Recent Labs    10/31/20 0429  TSH 10.137*      Radiology: DG Chest Portable 1 View  Result Date: 10/30/2020 CLINICAL DATA:  Chest pain EXAM: PORTABLE CHEST 1 VIEW COMPARISON:  08/03/2020 FINDINGS: Single frontal view of the chest demonstrates stenting artifact from external cardiac device. Cardiac silhouette is unremarkable. No airspace disease, effusion, or pneumothorax. No acute bony abnormalities. IMPRESSION: 1. No acute intrathoracic process. Electronically Signed   By: Randa Ngo M.D.   On: 10/30/2020 17:01    Scheduled Meds: . aspirin EC  81 mg Oral Daily  . heparin  5,000 Units Subcutaneous Q8H  . insulin aspart  0-6 Units Subcutaneous Q4H  . ivabradine  2.5 mg Oral BID WC  . nicotine  7 mg Transdermal Q24H  . rosuvastatin  40 mg Oral Daily  . ticagrelor  90 mg Oral BID   Continuous Infusions: PRN Meds:.acetaminophen **OR** acetaminophen, fentaNYL (SUBLIMAZE) injection, nitroGLYCERIN, oxyCODONE-acetaminophen  CARDIAC STUDIES:  EKG 10/30/2020: Sinus rhythm 73 bpm Nonspecific ST elevation inferior leads. Not STEMI  PV intervention 10/16/2020: Successful intravascular lithotripsy, PTCA and stenting 7.0X39 mm balloon expandable Viabahn VBX stent Rt common iliac artery  7.0X60 mm self expanding Absolute Pro stent Rt distal iliac artery  No named vessels at ankle level in both lower  extremities.  Will consult vascular surgery for Rt fem-to-left profunda bypass given left foot critical limb ischemia.  Abdominal Aortic Duplex 09/21/2020:  Moderate plaque noted in the proximal, mid and distal aorta. There is an  ulcerated plaque noted in the distal abdominal aorta. No AAA. Normal iliac  artery velocity.   Abdominal Aortic Duplex 09/21/2020:  Moderate plaque noted in the proximal, mid and distal aorta. There is an  ulcerated plaque noted in the distal abdominal aorta. No AAA. Normal iliac  artery velocity.  Lower Extremity Arterial Duplex 09/21/2020:  The right SFA is occluded in the proximal segment with reconstitution at  the level of the popliteal artery with diffuse monophasic waveform below  the knee. Right profunda femoral artery has >50% stenosis. There is  moderate mixed plaque noted throughout the right lower extremity.  Monophasic waveform throughout the left lower extremity, indicates  significant proximal disease (iliac artery).   Left SFA is occluded in the proximal segment and reconstitutes just above popliteal artery and diffuse dampened monophasic waveform throughout the lower extremity below the  knee. There is moderate mixed plaque throughout the left lower extremity.  This exam reveals severely decreased perfusion of the right lower  extremity, noted at the dorsalis pedis artery level (ABI 0.42) and  critically decreased perfusion of the left lower extremity, noted at the  dorsalis pedis and post tibial artery level (ABI 0.03).   Echocardiogram 09/11/2020:  Left ventricle cavity is moderately dilated. Moderate concentric  hypertrophy of the left ventricle. Moderate global and severe  inferolateral hypokinesis. LVEF 15-20%. Grade 1 diastolic dysfunction.  Normal left atrial pressure. Calculated EF 15%.  Mild to moderate mitral regurgitation.  Inadequate TR jet to estimate pulmonary artery systolic pressure. Normal  right atrial  pressure.  Compared to previous study in 03/2020, LVEF is reduced from 30-35%. Mitral  regurgitation is marginally improved.   EKG 08/14/2020: Sinus rhythm 81 bpm Old inferior and anterior infarct  Coronary intervention 08/03/2020: LM: Distal 40% stenosis LAD:Mid 30% disease LCx: Subtotally occluded OM2, prox LCx 70% stenosis Successful percutaneous coronary intervention OM2-Prox LCx PTCA and overlapping stents placement  2.5 X 38 mm and 2.5 X 18 mm Resolute Onyx drug-eluting stents 100%--->0% stenosis. TIMI flow 0-->III Small caliber distal vessel with moderate diffuse disease RCA: Prox 30% stenosis. Patent mid RCA sttent 2.5 X 26 mm Resolute Onyx drug-eluting stent  LVEDP 42 mmHg  Echocardiogram 08/02/2020: 1. Moderate global and severe inferolateral hypokinesis. Left ventricular  ejection fraction, by estimation, is 30 to 35%. The left ventricle has  normal function. The left ventricle demonstrates regional wall motion  abnormalities (see scoring  diagram/findings for description). There is mild left ventricular  hypertrophy. Left ventricular diastolic parameters are consistent with  Grade II diastolic dysfunction (pseudonormalization).  2. Right ventricular systolic function is low normal. The right  ventricular size is normal.  3. Left atrial size was mildly dilated.  4. The mitral valve is grossly normal. Moderate mitral valve  regurgitation.  5. The aortic valve is tricuspid. Aortic valve regurgitation is not  visualized. Mild aortic valve sclerosis is present, with no evidence of  aortic valve stenosis.  6. The inferior vena cava is dilated in size with >50% respiratory  variability, suggesting right atrial pressure of 8 mmHg.   Coronary intervention 08/01/2020: LM: Distal 30% stenosis LAD:Mid 30% disease LCx: Subtotally occluded OM2, bridging left-to-left and right-to-left collaterals RCA: Prox 30%  stenosis. Mid 100% occlusion Successful percutaneous coronary intervention mid RCA PTCA and stent placement 2.5 X 26 mm Resolute Onyx drug-eluting stent 100%--->0% stenosis. TIMI flow 0-->III    Assessment & Recommendations:  Mike [name] is a 59 y.o. Caucasian male with CAD, culprit (RCA) and nonculprit (LCx) PCI after STEMI 07/2020, HFrEF, moderate MR, h/o cardiac arrest 2/2 Torsades post PCI during index hospitalization, PAD with critical limb ischemia s/p Rt iliac revascularization, residual critical limb ischemia LLE with no immediate targets for revascularization, severe bilateral renal artery stenoses, acute kidney injury following peripheral arteriogram on  10/16/2020.   Shortness of breath: Likely combination of acute on chronic systolic heart failure, acute kidney injury, deconditioning. Chest x-ray is clean, no acute pulmonary edema. BNP mildly elevated. Monitor intake and output, daily weights. Given his acute kidney injury, avoiding Lasix unless patient has obvious pulmonary edema.  Troponin elevation: Likely supply demand mismatch in the setting of acute systolic heart failure, acute kidney injury. Do not suspect acute coronary syndrome.   Transaminitis: Likely due to congestive hepatopathy.  Albumin of 2.5 also reflects malnutrition in the setting of congestive hepatopathy and systolic heart failure.  Acute kidney injury: Cr improving. Phos mildly elevated, but otherwise, not in  uremia Consider nephrology consult  PAD: Successful revascularization of right common iliac and right external iliac artery. Un-revascularized severe PAD with critical limb ischemia left lower extremity. Given patient's severely CAD, HFrEF, surgical risk for revascularization is very high. Even with attempted percutaneous revascularization of left iliac artery, I do not think an his amputation can be avoided.  Risk of periprocedural kidney injury remains very high  even with small amount of contrast use, given his systolic heart failure and bilateral renal artery stenosis. Only realistic option for pain control could be above-the-knee amputation.  However, his perioperative cardiac risk remains elevated owing to his systolic heart failure, as well as advanced kidney disease, bilateral renal artery stenosis and malnutrition. I had a long discussion with patient and his sister regarding his options.  They have requested me to consult vascular surgery and general surgery consult regarding possibility of amputation. For now, continue pain control with percocet, as allowed by renal function.  Goals of care : Patient unequivocally expressed his wishes to be DNR.  Pending discussion regarding amputation, I will also get palliative care on board.  His overall prognosis remains poor.  After decision regarding amputation or not, consideration for hospice care is reasonable.   Complex decision making and end of life care discussion.    Nigel Mormon, MD Pager: [phone] Office: [phone]

## 2020-10-31 NOTE — Consult Note (Signed)
[facility] [place]  Patient name: Mike [name] MRN: [id number] DOB: [birth year] Sex: male   REQUESTING PROVIDER:    Dr. Rhodia Albright   REASON FOR CONSULT:    Left leg rest pain with ulceration  HISTORY OF PRESENT ILLNESS:   Mike [name] is a 59 y.o. male, who I have previously seen for evaluation of severe peripheral vascular disease.  The patient underwent cardiac catheterization after STEMI in August 2021.  He underwent PCI.  He still has an ejection fraction of 15%.  He recently underwent angiography and was found to have a occluded left iliac system with diseased right iliac system which was stented.  I was originally consulted for a femoral-femoral bypass graft.  Because of the patient's comorbidities and toe ulceration, I did not think that he would heal his ulcer with just a femorofemoral that would need distal revascularization.  He was felt to be too high risk for surgery.  He was seen in the office last week by Dr. Rhodia Albright and he had decompensated.  He was recently admitted.  He continues to have rest pain in the left foot with progression of the disease and mottling of his lower leg.  I have been asked for further recommendations.  The patient is on Brilinta and aspirin for his recent STEMI.  He is on a statin for hypercholesterolemia.  He is a current smoker.  He suffers from CKD.  He has been evaluated by palliative care and does not want to pursue comfort measures at this time.  PAST MEDICAL HISTORY    Past Medical History:  Diagnosis Date  . CAD (coronary artery disease)   . Heart attack ([place])   . Hyperlipidemia   . Hypertension      FAMILY HISTORY   Family History  Problem Relation Age of Onset  . Heart disease Mother   . Heart disease Father   . Cancer Brother     SOCIAL HISTORY:   Social History   Socioeconomic History  . Marital status: Single    Spouse name: Not on file  . Number of  children: 1  . Years of education: Not on file  . Highest education level: Not on file  Occupational History  . Not on file  Tobacco Use  . Smoking status: Current Some Day Smoker    Packs/day: 0.50    Years: 40.00    Pack years: 20.00    Types: Cigarettes  . Smokeless tobacco: Never Used  Vaping Use  . Vaping Use: Never used  Substance and Sexual Activity  . Alcohol use: Yes    Alcohol/week: 10.0 standard drinks    Types: 10 Cans of beer per week    Comment: occ  . Drug use: Never  . Sexual activity: Not on file  Other Topics Concern  . Not on file  Social History Narrative  . Not on file   Social Determinants of Health   Financial Resource Strain:   . Difficulty of Paying Living Expenses: Not on file  Food Insecurity:   . Worried About Charity fundraiser in the Last Year: Not on file  . Ran Out of Food in the Last Year: Not on file  Transportation Needs:   . Lack of Transportation (Medical): Not on file  . Lack of Transportation (Non-Medical): Not on file  Physical Activity:   . Days of Exercise per Week: Not on file  . Minutes of Exercise per Session: Not on file  Stress:   . Feeling of Stress : Not on file  Social Connections:   . Frequency of Communication with Friends and Family: Not on file  . Frequency of Social Gatherings with Friends and Family: Not on file  . Attends Religious Services: Not on file  . Active Member of Clubs or Organizations: Not on file  . Attends Archivist Meetings: Not on file  . Marital Status: Not on file  Intimate Partner Violence:   . Fear of Current or Ex-Partner: Not on file  . Emotionally Abused: Not on file  . Physically Abused: Not on file  . Sexually Abused: Not on file    ALLERGIES:    No Known Allergies  CURRENT MEDICATIONS:    Current Facility-Administered Medications  Medication Dose Route Frequency Provider Last Rate Last Admin  . acetaminophen (TYLENOL) tablet 650 mg  650 mg Oral Q6H PRN  Toy Baker, MD       Or  . acetaminophen (TYLENOL) suppository 650 mg  650 mg Rectal Q6H PRN Doutova, Anastassia, MD      . aspirin EC tablet 81 mg  81 mg Oral Daily Doutova, Anastassia, MD   81 mg at 10/31/20 1029  . fentaNYL (SUBLIMAZE) injection 50 mcg  50 mcg Intravenous Q2H PRN Charlynne Cousins, MD   50 mcg at 10/31/20 1814  . Gerhardt's butt cream   Topical BID Charlynne Cousins, MD   Given at 10/31/20 1219  . heparin injection 5,000 Units  5,000 Units Subcutaneous Q8H Toy Baker, MD   5,000 Units at 10/31/20 1544  . insulin aspart (novoLOG) injection 0-6 Units  0-6 Units Subcutaneous Q4H Doutova, Anastassia, MD      . ivabradine (CORLANOR) tablet 2.5 mg  2.5 mg Oral BID WC Doutova, Anastassia, MD      . nicotine (NICODERM CQ - dosed in mg/24 hr) patch 7 mg  7 mg Transdermal Q24H Doutova, Anastassia, MD   7 mg at 10/31/20 1032  . nitroGLYCERIN (NITROSTAT) SL tablet 0.4 mg  0.4 mg Sublingual Q5 min PRN Doutova, Anastassia, MD      . oxyCODONE-acetaminophen (PERCOCET/ROXICET) 5-325 MG per tablet 1 tablet  1 tablet Oral Q8H PRN Toy Baker, MD   1 tablet at 10/31/20 1522  . rosuvastatin (CRESTOR) tablet 40 mg  40 mg Oral Daily Doutova, Anastassia, MD   40 mg at 10/31/20 1030  . ticagrelor (BRILINTA) tablet 90 mg  90 mg Oral BID Toy Baker, MD   90 mg at 10/31/20 1030    REVIEW OF SYSTEMS:   [X]  denotes positive finding, [ ]  denotes negative finding Cardiac  Comments:  Chest pain or chest pressure:    Shortness of breath upon exertion: x   Short of breath when lying flat:    Irregular heart rhythm:        Vascular    Pain in calf, thigh, or hip brought on by ambulation:    Pain in feet at night that wakes you up from your sleep:  x   Blood clot in your veins:    Leg swelling:         Pulmonary    Oxygen at home:    Productive cough:     Wheezing:         Neurologic    Sudden weakness in arms or legs:     Sudden numbness in arms or  legs:     Sudden onset of difficulty speaking or slurred speech:    Temporary  loss of vision in one eye:     Problems with dizziness:         Gastrointestinal    Blood in stool:      Vomited blood:         Genitourinary    Burning when urinating:     Blood in urine:        Psychiatric    Major depression:         Hematologic    Bleeding problems:    Problems with blood clotting too easily:        Skin    Rashes or ulcers: x       Constitutional    Fever or chills:     PHYSICAL EXAM:   Vitals:   10/31/20 0728 10/31/20 1024 10/31/20 1650 10/31/20 1812  BP: (!) 87/60 (!) 97/56 (!) 117/95 95/65  Pulse: 64  68 71  Resp: 20  20   Temp: 97.7 F (36.5 C)  97.9 F (36.6 C)   TempSrc: Oral  Oral   SpO2: 98%  99%   Weight:      Height:        GENERAL: The patient is a well-nourished male, in no acute distress. The vital signs are documented above. CARDIAC: There is a regular rate and rhythm.  VASCULAR: Nonpalpable pulses on the left with progression of mottling of his lower leg.  The leg is warm beginning at the level of the knee. PULMONARY: Nonlabored respirations ABDOMEN: Soft and non-tender with normal pitched bowel sounds.  MUSCULOSKELETAL: There are no major deformities or cyanosis. NEUROLOGIC: No focal weakness or paresthesias are detected. SKIN: Progression of second toe ulceration PSYCHIATRIC: The patient has a normal affect.  STUDIES:   I reviewed his chest x-ray which showed no acute process.  ASSESSMENT and PLAN   Ischemic left leg: The patient has had progression of his atherosclerotic vascular disease in his left leg.  He has rest pain and a nonhealing wound.  I discussed with the patient that I think he is too high risk for surgical revascularization which would entail a right to left femoral-femoral bypass graft and a distal bypass graft.  At this point I think his best option is an above-knee amputation to alleviate his rest pain.  Preferably, I would  like to do this off of Brilinta.  I would be willing to do this on aspirin and Plavix as he needs to be on dual antiplatelet therapy because of his recent PCI.  If we can transition him to aspirin and Plavix, I will consider doing a left above-knee amputation next week.  The patient is in agreement with this plan.   Leia Alf, MD, FACS [facility] [phone] Pager [phone]

## 2020-10-31 NOTE — Evaluation (Signed)
Occupational Therapy Evaluation Patient Details Name: Mike [name] MRN: [id number] DOB: [birth year] Today's Date: 10/31/2020    History of Present Illness Pt is a 59 year old man admitted on 10/30/20 with SOB and inability to walk with severe L LE pain. Pt with critical L LE ischemia. PMH: STEMI 07/2020 with moderate MR due to torsade post PCI, L LE ischemia with R iliac revascularization, CKD IV, CHF, DM.    Clinical Impression   Pt has been struggling to care for himself x 2 months. He lives alone and has a supportive sister who checks on him and provides meals intermittently. Pt presents with L LE pain, generalized weakness, decreased activity tolerance and impaired standing balance. He was able to stand and take a few steps with RW and min guard assist. He requires up to moderate assistance for ADL. Pt noted to desaturate in the mid 80s on RA, rebounded to mid 90s with replacement of 2L. Recommending SNF.     Follow Up Recommendations  SNF    Equipment Recommendations  3 in 1 bedside commode;Tub/shower bench;Wheelchair (measurements OT);Wheelchair cushion (measurements OT)    Recommendations for Other Services       Precautions / Restrictions Precautions Precautions: Fall;Other (comment) Precaution Comments: sacral wound      Mobility Bed Mobility Overal bed mobility: Needs Assistance Bed Mobility: Rolling;Supine to Sit;Sit to Supine Rolling: Modified independent (Device/Increase time)   Supine to sit: Supervision;HOB elevated Sit to supine: Mod assist   General bed mobility comments: HOB up maximally, pt able to use rail to assist in sitting EOB, assist for LEs back into bed, rolled using rail to position pt off his sacrum    Transfers Overall transfer level: Needs assistance Equipment used: Rolling walker (2 wheeled) Transfers: Sit to/from Stand Sit to Stand: Min guard         General transfer comment: cues for hand placement    Balance Overall balance  assessment: Needs assistance   Sitting balance-Leahy Scale: Good     Standing balance support: Bilateral upper extremity supported Standing balance-Leahy Scale: Poor Standing balance comment: reliant on B UE support in standing                           ADL either performed or assessed with clinical judgement   ADL Overall ADL's : Needs assistance/impaired Eating/Feeding: Independent   Grooming: Set up;Sitting   Upper Body Bathing: Set up;Sitting   Lower Body Bathing: Moderate assistance;Sit to/from stand   Upper Body Dressing : Set up;Sitting   Lower Body Dressing: Moderate assistance;Sit to/from stand   Toilet Transfer: Min guard;RW;Ambulation   Toileting- Water quality scientist and Hygiene: Min guard;Sit to/from stand       Functional mobility during ADLs: Min guard;Rolling walker General ADL Comments: pt with Sp02 down to mid 80s on RA, replaced 2L 02     Vision Patient Visual Report: No change from baseline       Perception     Praxis      Pertinent Vitals/Pain Pain Assessment: 0-10 Pain Score: 8  Pain Location: L LE Pain Descriptors / Indicators: Grimacing;Guarding Pain Intervention(s): Premedicated before session;Monitored during session;Repositioned     Hand Dominance Right   Extremity/Trunk Assessment Upper Extremity Assessment Upper Extremity Assessment: Overall WFL for tasks assessed   Lower Extremity Assessment Lower Extremity Assessment: Defer to PT evaluation   Cervical / Trunk Assessment Cervical / Trunk Assessment: Normal   Communication Communication Communication: No difficulties  Cognition Arousal/Alertness: Awake/alert Behavior During Therapy: WFL for tasks assessed/performed Overall Cognitive Status: Within Functional Limits for tasks assessed                                     General Comments       Exercises     Shoulder Instructions      Home Living Family/patient expects to be  discharged to:: Private residence Living Arrangements: Alone Available Help at Discharge: Family;Available PRN/intermittently Type of Home: Apartment Home Access: Level entry     Home Layout: One level     Bathroom Shower/Tub: Teacher, early years/pre: Standard     Home Equipment: None          Prior Functioning/Environment Level of Independence: Independent  Gait / Transfers Assistance Needed: pt was furniture walking to get to the bathroom  ADL's / Homemaking Assistance Needed: Pt has primarily being laying on the couch for the last 2 months. Sister brings in meals periodically            OT Problem List: Decreased strength;Decreased activity tolerance;Impaired balance (sitting and/or standing);Decreased knowledge of use of DME or AE;Pain;Cardiopulmonary status limiting activity      OT Treatment/Interventions: Self-care/ADL training;DME and/or AE instruction;Patient/family education;Balance training;Therapeutic activities;Energy conservation    OT Goals(Current goals can be found in the care plan section) Acute Rehab OT Goals Patient Stated Goal: agrees he is unsafe to go home with only intermittent assist OT Goal Formulation: With patient Time For Goal Achievement: 11/14/20 Potential to Achieve Goals: Fair ADL Goals Pt Will Perform Grooming: with supervision;sitting (at sink) Pt Will Perform Lower Body Bathing: with supervision;with adaptive equipment;sit to/from stand Pt Will Perform Lower Body Dressing: with supervision;with adaptive equipment;sit to/from stand Pt Will Transfer to Toilet: with supervision;ambulating;bedside commode (over toilet) Pt Will Perform Toileting - Clothing Manipulation and hygiene: with supervision;sit to/from stand Pt Will Perform Tub/Shower Transfer: with supervision;ambulating;tub bench;rolling walker Additional ADL Goal #1: Pt will generalize energy conservation strategies in ADL and mobility.  OT Frequency: Min 2X/week    Barriers to D/C: Decreased caregiver support          Co-evaluation              AM-PAC OT "6 Clicks" Daily Activity     Outcome Measure Help from another person eating meals?: None Help from another person taking care of personal grooming?: A Little Help from another person toileting, which includes using toliet, bedpan, or urinal?: A Little Help from another person bathing (including washing, rinsing, drying)?: A Lot Help from another person to put on and taking off regular upper body clothing?: A Little Help from another person to put on and taking off regular lower body clothing?: A Lot 6 Click Score: 17   End of Session Equipment Utilized During Treatment: Rolling walker;Oxygen (2L) Nurse Communication: Other (comment) (needs pressure relief mattress)  Activity Tolerance: Patient limited by pain;Patient limited by fatigue Patient left: in bed;with call bell/phone within reach;with bed alarm set;with family/visitor present  OT Visit Diagnosis: Unsteadiness on feet (R26.81);Other abnormalities of gait and mobility (R26.89);Pain Pain - Right/Left: Left Pain - part of body: Leg                Time: 2956-2130 OT Time Calculation (min): 27 min Charges:  OT General Charges $OT Visit: 1 Visit OT Evaluation $OT Eval Moderate Complexity: 1 Mod  Nestor Lewandowsky, OTR/L Acute  Rehabilitation Services Pager: [phone] Office: [phone]  Malka So 10/31/2020, 11:21 AM

## 2020-10-31 NOTE — Consult Note (Signed)
Consultation Note Date: 10/31/2020   Patient Name: Mike [name]  DOB: July 05, 1961  MRN: [id number]  Age / Sex: 59 y.o., male  PCP: Patient, No Pcp Per Referring Physician: Aileen Fass, Tammi Klippel, MD  Reason for Consultation: Establishing goals of care  HPI/Patient Profile: 59 y.o. male  with past medical history of CAD, STEMI Aug 2021, CHF, Cardiac arrest, PAD, severe bilateral renal artery stenosis, and CKD admitted on 10/30/2020 with critical limb ischemia.  Patient is very high risk for surgery d/t CKD and systolic heart failure. PMT consulted to discuss [place].  Clinical Assessment and Goals of Care: I have reviewed medical records including EPIC notes, labs and imaging, assessed the patient and then met with patient to discuss diagnosis prognosis, GOC, EOL wishes, disposition and options.  I introduced Palliative Medicine as specialized medical care for people living with serious illness. It focuses on providing relief from the symptoms and stress of a serious illness. The goal is to improve quality of life for both the patient and the family.  At time of my visit patient was in mild distress - short of breath after attempting to urinate and also with significant pain in LLE. Conversation was brief d/t patient's distress.   Patient shares that prior to his MI in August he was doing very well - some occasional shortness of breath but he attributed this to smoking. Since his MI he tells me of a steady decline - "one thing after another".   We discussed patient's current illness and what it means in the larger context of patient's on-going co-morbidities.  Natural disease trajectory and expectations at EOL were discussed. He understands risks associated with potential amputation but is interested in exploring this option further. He tells me he spoke to the surgeon this morning regarding amputation and was waiting to hear back from surgeon on options.  We did discuss alternative option of avoiding all aggressive medical interventions and solely focusing on comfort and symptom relief. At this time he would like to wait and see what surgery is able to offer.   I attempted to elicit values and goals of care important to the patient. He shares that he does not want to suffer but would like "more time" if this is feasible.    The difference between aggressive medical intervention and comfort care was considered in light of the patient's goals of care.   We discussed his code status - he is currently listed as limited - no intubation. Discussed that if he were to code and require CPR he would likely require intubation. He shares he does not want this. He agrees to DNR.   He shares his pain is not adequately controlled - however on chart review he has not received medications currently ordered for PRN pain - will discuss with RN.   Discussed with patient the importance of continued conversation with family and the medical providers regarding overall plan of care and treatment options, ensuring decisions are within the context of the patient's values and GOCs.    Questions and concerns were addressed. Discussed we will follow up tomorrow.   Primary Decision Maker PATIENT    SUMMARY OF RECOMMENDATIONS   - code status changed to DNR per conversation with patient - patient currently interested in pursuing amputation - interested in what surgery can offer him - will await this conversation before further [place] conversation - did introduce idea of full comfort measures/avoiding aggressive interventions, however, he would like to hear from surgery first -  will f/u tomorrow  Code Status/Advance Care Planning:  DNR  Symptom Management:   Discussed with RN administration of PRN fentanyl and percocet   Prognosis:   < 6 months however high risk for acute decompensation  Discharge Planning: To Be Determined      Primary Diagnoses: Present on  Admission: . Tobacco dependence . Rest pain of left upper extremity due to atherosclerosis ([place]) . PAD (peripheral artery disease) ([place]) . Mixed hyperlipidemia . Coronary artery disease involving native coronary artery of native heart without angina pectoris . Hyperglycemia . Tobacco abuse . Leukocytosis . Chronic combined systolic and diastolic CHF (congestive heart failure) ([place]) . Chest pain   I have reviewed the medical record, interviewed the patient and family, and examined the patient. The following aspects are pertinent.  Past Medical History:  Diagnosis Date  . CAD (coronary artery disease)   . Heart attack ([place])   . Hyperlipidemia   . Hypertension    Social History   Socioeconomic History  . Marital status: Single    Spouse name: Not on file  . Number of children: 1  . Years of education: Not on file  . Highest education level: Not on file  Occupational History  . Not on file  Tobacco Use  . Smoking status: Current Some Day Smoker    Packs/day: 0.50    Years: 40.00    Pack years: 20.00    Types: Cigarettes  . Smokeless tobacco: Never Used  Vaping Use  . Vaping Use: Never used  Substance and Sexual Activity  . Alcohol use: Yes    Alcohol/week: 10.0 standard drinks    Types: 10 Cans of beer per week    Comment: occ  . Drug use: Never  . Sexual activity: Not on file  Other Topics Concern  . Not on file  Social History Narrative  . Not on file   Social Determinants of Health   Financial Resource Strain:   . Difficulty of Paying Living Expenses: Not on file  Food Insecurity:   . Worried About Charity fundraiser in the Last Year: Not on file  . Ran Out of Food in the Last Year: Not on file  Transportation Needs:   . Lack of Transportation (Medical): Not on file  . Lack of Transportation (Non-Medical): Not on file  Physical Activity:   . Days of Exercise per Week: Not on file  . Minutes of Exercise per Session: Not on file  Stress:   . Feeling of  Stress : Not on file  Social Connections:   . Frequency of Communication with Friends and Family: Not on file  . Frequency of Social Gatherings with Friends and Family: Not on file  . Attends Religious Services: Not on file  . Active Member of Clubs or Organizations: Not on file  . Attends Archivist Meetings: Not on file  . Marital Status: Not on file   Family History  Problem Relation Age of Onset  . Heart disease Mother   . Heart disease Father   . Cancer Brother    Scheduled Meds: . aspirin EC  81 mg Oral Daily  . Gerhardt's butt cream   Topical BID  . heparin  5,000 Units Subcutaneous Q8H  . insulin aspart  0-6 Units Subcutaneous Q4H  . ivabradine  2.5 mg Oral BID WC  . living well with diabetes book   Does not apply Once  . nicotine  7 mg Transdermal Q24H  . rosuvastatin  40 mg Oral Daily  . ticagrelor  90 mg Oral BID   Continuous Infusions: PRN Meds:.acetaminophen **OR** acetaminophen, fentaNYL (SUBLIMAZE) injection, nitroGLYCERIN, oxyCODONE-acetaminophen No Known Allergies Review of Systems  Constitutional: Positive for activity change, appetite change and fatigue.  Respiratory: Positive for shortness of breath.     Physical Exam Alert and oriented Mild tachypnea and accessory muscle use Warm and dry Discolored LLE Slightly anxious  Vital Signs: BP (!) 97/56 (BP Location: Left Arm)   Pulse 64   Temp 97.7 F (36.5 C) (Oral)   Resp 20   Ht 6' (1.829 m)   Wt 67.4 kg   SpO2 98%   BMI 20.15 kg/m  Pain Scale: 0-10   Pain Score: 8    SpO2: SpO2: 98 % O2 Device:SpO2: 98 % O2 Flow Rate: .O2 Flow Rate (L/min): 2 L/min  IO: Intake/output summary:   Intake/Output Summary (Last 24 hours) at 10/31/2020 1535 Last data filed at 10/31/2020 0643 Gross per 24 hour  Intake 240 ml  Output 350 ml  Net -110 ml    LBM:   Baseline Weight: Weight: 67.4 kg Most recent weight: Weight: 67.4 kg     Palliative Assessment/Data: PPS 50%    Time Total: 50  minutes Greater than 50%  of this time was spent counseling and coordinating care related to the above assessment and plan.  Juel Burrow, DNP, AGNP-C Palliative Medicine Team [phone] Pager: [phone]

## 2020-10-31 NOTE — Progress Notes (Signed)
Wound care done

## 2020-10-31 NOTE — Progress Notes (Signed)
[facility] PROGRESS NOTE    Progress Note  IVAR DOMANGUE  CBJ:[id number] DOB: 1961/10/08 DOA: 10/30/2020 PCP: Patient, No Pcp Per     Brief Narrative:   CODEE TUTSON is an 59 y.o. male past medical history significant for STEMI in 08/17/2020 with moderate MR cardiac arrest secondary to torsade post PCI, he has no known resolve ST segment elevation in the inferior leads since his STEMI in 08/17/2020, critical limb ischemia with right iliac revascularization with residual limb ischemia in the left lower extremity, severe bilateral renal artery stenosis, acute kidney injury following arteriogram on 10/16/2020 is into the hospital complaining of shortness of breath and inability to walk due to severe leg pain.  Cardiology was consulted and unfortunately he has an extremely high risk of limb loss and in view of his underlying chronic kidney disease stage IV chronic systolic heart failure that is due to ischemic cardiomyopathy he has an extremely high risk of mortality.  Urology recommended to consult  Care to move towards comfort care.   Assessment/Plan:   Critical limb ischemia with pain at rest on the left lower extremity with no revascularization at this point, vascular surgery consult was obtained on 10/17/2020: Cardiology was consulted recommended moving towards comfort care. They will meet with family this morning. Continue narcotics for pain control.  Ongoing tobacco abuse: The admitting physician spoke to him and at this point in time he is interested in quitting and nicotine patch was ordered.  Hyperlipidemia: Continue statins.  Coronary artery disease: Continue statins, beta-blockers, aspirin and Brilinta. Patient is chronic recurrent chest pain troponins are stable EKG changes are chronic.  Chronic kidney disease stage IV: Creatinine appears to be at baseline.  Hyperglycemia/diabetes mellitus type 2: No prior history of diabetes, globin A1c of 7.1, will  continue sliding scale insulin.  History of ischemic cardiomyopathy: Appears to be euvolemic blood pressure is soft holding Lasix.  Leukocytosis: He has remained afebrile will monitor for any signs of infection, his cardiologist was consulted recommended moving towards comfort care.    DVT prophylaxis: lovenxo Family Communication:none Status is: Inpatient  Remains inpatient appropriate because:Hemodynamically unstable   Dispo: The patient is from: Home              Anticipated d/c is to: Home              Anticipated d/c date is: 2 days              Patient currently is not medically stable to d/c.        Code Status:     Code Status Orders  (From admission, onward)         Start     Ordered   10/31/20 0235  Limited resuscitation (code)  Continuous       Question Answer Comment  In the event of cardiac or respiratory ARREST: Initiate Code Blue, Call Rapid Response Yes   In the event of cardiac or respiratory ARREST: Perform CPR Yes   In the event of cardiac or respiratory ARREST: Perform Intubation/Mechanical Ventilation No   In the event of cardiac or respiratory ARREST: Use NIPPV/BiPAp only if indicated Yes   In the event of cardiac or respiratory ARREST: Administer ACLS medications if indicated Yes   In the event of cardiac or respiratory ARREST: Perform Defibrillation or Cardioversion if indicated Yes      10/31/20 0234        Code Status History    Date Active Date  Inactive Code Status Order ID Comments User Context   10/16/2020 1912 10/18/2020 2008 Full Code [id number]  Nigel Mormon, MD Inpatient   08/02/2020 0035 08/04/2020 1745 Full Code [id number]  Nigel Mormon, MD Inpatient   Advance Care Planning Activity    Advance Directive Documentation     Most Recent Value  Type of Advance Directive Living will, Healthcare Power of Attorney  Pre-existing out of facility DNR order (yellow form or pink MOST form) --  "MOST" Form in Place? --         IV Access:    Peripheral IV   Procedures and diagnostic studies:   DG Chest Portable 1 View  Result Date: 10/30/2020 CLINICAL DATA:  Chest pain EXAM: PORTABLE CHEST 1 VIEW COMPARISON:  08/03/2020 FINDINGS: Single frontal view of the chest demonstrates stenting artifact from external cardiac device. Cardiac silhouette is unremarkable. No airspace disease, effusion, or pneumothorax. No acute bony abnormalities. IMPRESSION: 1. No acute intrathoracic process. Electronically Signed   By: Randa Ngo M.D.   On: 10/30/2020 17:01     Medical Consultants:    None.  Anti-Infectives:   none  Subjective:    TRAFTON ROKER continues to have pain, narcotics not helping.  Objective:    Vitals:   10/31/20 0315 10/31/20 0410 10/31/20 0410 10/31/20 0728  BP: 96/62  (!) 108/58 (!) 87/60  Pulse: 65  (!) 52 64  Resp:   20 20  Temp:   97.9 F (36.6 C) 97.7 F (36.5 C)  TempSrc:   Oral Oral  SpO2: 97%  97% 98%  Weight:  67.4 kg    Height:  6' (1.829 m)     SpO2: 98 % O2 Flow Rate (L/min): 2 L/min   Intake/Output Summary (Last 24 hours) at 10/31/2020 0828 Last data filed at 10/31/2020 0643 Gross per 24 hour  Intake 240 ml  Output 350 ml  Net -110 ml   Filed Weights   10/31/20 0410  Weight: 67.4 kg    Exam: General exam: In no acute distress. Respiratory system: Good air movement and clear to auscultation. Cardiovascular system: S1 & S2 heard, RRR. No JVD.  Gastrointestinal system: Abdomen is nondistended, soft and nontender.  Extremities: Mottled left lower extremity edema mildly cold with a dry ulcer at the tip of the third [place] Skin: No rashes, lesions or ulcers   Data Reviewed:    Labs: Basic Metabolic Panel: Recent Labs  Lab 10/26/20 1004 10/30/20 1652 10/31/20 0429  NA 135 136  --   K 3.9 4.1  --   CL 98 102  --   CO2 11* 11*  --   GLUCOSE 210* 205*  --   BUN 79* 104*  --   CREATININE 5.36* 4.62*  --   CALCIUM 9.1 9.3  --   MG  --    --  2.4  PHOS  --   --  6.5*   GFR Estimated Creatinine Clearance: 16 mL/min (A) (by C-G formula based on SCr of 4.62 mg/dL (H)). Liver Function Tests: Recent Labs  Lab 10/31/20 0429  AST 235*  ALT 123*  ALKPHOS 81  BILITOT 0.9  PROT 7.2  ALBUMIN 2.5*   No results for input(s): LIPASE, AMYLASE in the last 168 hours. No results for input(s): AMMONIA in the last 168 hours. Coagulation profile Recent Labs  Lab 10/30/20 1822  INR 1.2   COVID-19 Labs  No results for input(s): DDIMER, FERRITIN, LDH, CRP in the last 72 hours.  Lab Results  Component Value Date   SARSCOV2NAA NEGATIVE 10/30/2020   [place] NEGATIVE 10/12/2020   [place] NEGATIVE 08/02/2020    CBC: Recent Labs  Lab 10/30/20 1652 10/31/20 0429  WBC 21.9* 19.5*  NEUTROABS  --  17.0*  HGB 12.2* 11.4*  HCT 37.3* 34.6*  MCV 85.4 83.8  PLT 379 296   Cardiac Enzymes: No results for input(s): CKTOTAL, CKMB, CKMBINDEX, TROPONINI in the last 168 hours. BNP (last 3 results) Recent Labs    09/25/20 0904 10/12/20 0812  PROBNP 2,404* 1,642*   CBG: Recent Labs  Lab 10/30/20 2356 10/31/20 0445 10/31/20 0749  GLUCAP 111* 104* 102*   D-Dimer: No results for input(s): DDIMER in the last 72 hours. Hgb A1c: Recent Labs    10/31/20 0120  HGBA1C 7.1*   Lipid Profile: No results for input(s): CHOL, HDL, LDLCALC, TRIG, CHOLHDL, LDLDIRECT in the last 72 hours. Thyroid function studies: Recent Labs    10/31/20 0429  TSH 10.137*   Anemia work up: No results for input(s): VITAMINB12, FOLATE, FERRITIN, TIBC, IRON, RETICCTPCT in the last 72 hours. Sepsis Labs: Recent Labs  Lab 10/30/20 1652 10/30/20 2111 10/30/20 2141 10/31/20 0133 10/31/20 0429  PROCALCITON  --  1.19  --   --   --   WBC 21.9*  --   --   --  19.5*  LATICACIDVEN  --   --  1.5 1.3  --    Microbiology Recent Results (from the past 240 hour(s))  Respiratory Panel by RT PCR (Flu A&B, Covid) - Nasopharyngeal Swab     Status:  None   Collection Time: 10/30/20  7:51 PM   Specimen: Nasopharyngeal Swab  Result Value Ref Range Status   SARS Coronavirus 2 by RT PCR NEGATIVE NEGATIVE Final    Comment: (NOTE) SARS-CoV-2 target nucleic acids are NOT DETECTED.  The SARS-CoV-2 RNA is generally detectable in upper respiratoy specimens during the acute phase of infection. The lowest concentration of SARS-CoV-2 viral copies this assay can detect is 131 copies/mL. A negative result does not preclude SARS-Cov-2 infection and should not be used as the sole basis for treatment or other patient management decisions. A negative result may occur with  improper specimen collection/handling, submission of specimen other than nasopharyngeal swab, presence of viral mutation(s) within the areas targeted by this assay, and inadequate number of viral copies (<131 copies/mL). A negative result must be combined with clinical observations, patient history, and epidemiological information. The expected result is Negative.  Fact Sheet for Patients:  PinkCheek.be  Fact Sheet for Healthcare Providers:  GravelBags.it  This test is no t yet approved or cleared by the [place] FDA and  has been authorized for detection and/or diagnosis of SARS-CoV-2 by FDA under an Emergency Use Authorization (EUA). This EUA will remain  in effect (meaning this test can be used) for the duration of the COVID-19 declaration under Section 564(b)(1) of the Act, 21 U.S.C. section 360bbb-3(b)(1), unless the authorization is terminated or revoked sooner.     Influenza A by PCR NEGATIVE NEGATIVE Final   Influenza B by PCR NEGATIVE NEGATIVE Final    Comment: (NOTE) The Xpert Xpress SARS-CoV-2/FLU/RSV assay is intended as an aid in  the diagnosis of influenza from Nasopharyngeal swab specimens and  should not be used as a sole basis for treatment. Nasal washings and  aspirates are unacceptable for  Xpert Xpress SARS-CoV-2/FLU/RSV  testing.  Fact Sheet for Patients: PinkCheek.be  Fact Sheet for Healthcare Providers: GravelBags.it  This test  is not yet approved or cleared by the [place] and  has been authorized for detection and/or diagnosis of SARS-CoV-2 by  FDA under an Emergency Use Authorization (EUA). This EUA will remain  in effect (meaning this test can be used) for the duration of the  Covid-19 declaration under Section 564(b)(1) of the Act, 21  U.S.C. section 360bbb-3(b)(1), unless the authorization is  terminated or revoked. Performed at [facility], [place] [address] [place], [place] [address]   Culture, blood (x 2)     Status: None (Preliminary result)   Collection Time: 10/30/20  9:41 PM   Specimen: BLOOD  Result Value Ref Range Status   Specimen Description BLOOD RIGHT ANTECUBITAL  Final   Special Requests   Final    BOTTLES DRAWN AEROBIC AND ANAEROBIC Blood Culture adequate volume   Culture   Final    NO GROWTH < 12 HOURS Performed at [facility], [place] [address] [place], [place] [address]    Report Status PENDING  Incomplete     Medications:   . aspirin EC  81 mg Oral Daily  . heparin  5,000 Units Subcutaneous Q8H  . insulin aspart  0-6 Units Subcutaneous Q4H  . ivabradine  2.5 mg Oral BID WC  . nicotine  7 mg Transdermal Q24H  . rosuvastatin  40 mg Oral Daily  . ticagrelor  90 mg Oral BID   Continuous Infusions:    LOS: 1 day   Charlynne Cousins  [facility]  10/31/2020, 8:28 AM

## 2020-10-31 NOTE — Consult Note (Signed)
[facility] Nurse Consult Note: Patient receiving care in [place] Reason for Consult: sacral wound Wound type: MASD/ITD with fissure on left buttock at the edge of the excoriation.  Measurement: Fissure measures 2 cm x 0.1 cm x 0.1cm Wound bed: Pink, red Drainage (amount, consistency, odor) None Periwound: Excoriation surrounding fissure expanding through the gluteal folds bilaterally.  Dressing procedure/placement/frequency: Gently clean buttocks with soap and water, pat dry. Apply Gerhardt's butt cream to the entire excoriated area twice daily or PRN soiling.   Monitor the wound area(s) for worsening of condition such as: Signs/symptoms of infection, increase in size, development of or worsening of odor, development of pain, or increased pain at the affected locations.   Notify the medical team if any of these develop.  Thank you for the consult. [place] nurse will not follow at this time.   Please re-consult the [place] team if needed.  Cathlean Marseilles Tamala Julian, MSN, RN, [place], Lysle Pearl, [facility] Wound Treatment Associate Pager [phone]

## 2020-10-31 NOTE — Evaluation (Signed)
Physical Therapy Evaluation Patient Details Name: Mike [name] MRN: [id number] DOB: 1961-09-01 Today's Date: 10/31/2020   History of Present Illness  Pt is a 59 year old man admitted on 10/30/20 with SOB and inability to walk with severe L LE pain. Pt with critical L LE ischemia. PMH: STEMI 07/2020 with moderate MR due to torsade post PCI, L LE ischemia with R iliac revascularization, CKD IV, CHF, DM.   Clinical Impression  Prior to admission, pt lives alone and reports he has been struggling to care for himself for the past two month. He has been largely sedentary, with intermittent assist from sister to check on him and provide meals. Pt presents with LLE pain, decreased endurance, generalized weakness and balance deficits. Able to ambulate ~3 feet with a walker at a min guard assist level; further distance limited by fatigue and dizziness. Desaturation to 84% on RA, placed back on 2L O2. Would benefit from SNF to address deficits and maximize functional mobility.     Follow Up Recommendations SNF    Equipment Recommendations  Rolling walker with 5" wheels;3in1 (PT);Wheelchair (measurements PT);Wheelchair cushion (measurements PT);Other (comment) (tub bench)    Recommendations for Other Services       Precautions / Restrictions Precautions Precautions: Fall;Other (comment) Precaution Comments: sacral wound Restrictions Weight Bearing Restrictions: No      Mobility  Bed Mobility Overal bed mobility: Needs Assistance Bed Mobility: Rolling;Supine to Sit;Sit to Supine Rolling: Modified independent (Device/Increase time)   Supine to sit: Supervision;HOB elevated Sit to supine: Mod assist   General bed mobility comments: HOB up maximally, pt able to use rail to assist in sitting EOB, assist for LEs back into bed, rolled using rail to position pt off his sacrum    Transfers Overall transfer level: Needs assistance Equipment used: Rolling walker (2 wheeled) Transfers: Sit  to/from Stand Sit to Stand: Min guard         General transfer comment: cues for hand placement  Ambulation/Gait Ambulation/Gait assistance: Min guard Gait Distance (Feet): 3 Feet Assistive device: Rolling walker (2 wheeled) Gait Pattern/deviations: Step-through pattern;Decreased stride length;Antalgic;Decreased weight shift to left Gait velocity: decreased Gait velocity interpretation: <1.8 ft/sec, indicate of risk for recurrent falls General Gait Details: Pt taking 3 steps forward and backwards, cues for use of walker, min guard for safety. Further ambulation deferred due to pt fatigue and reporting dizziness  Stairs            Wheelchair Mobility    Modified Rankin (Stroke Patients Only)       Balance Overall balance assessment: Needs assistance   Sitting balance-Leahy Scale: Good     Standing balance support: Bilateral upper extremity supported Standing balance-Leahy Scale: Poor Standing balance comment: reliant on B UE support in standing                             Pertinent Vitals/Pain Pain Assessment: 0-10 Pain Score: 8  Pain Location: L LE Pain Descriptors / Indicators: Grimacing;Guarding Pain Intervention(s): Limited activity within patient's tolerance;Monitored during session;Premedicated before session;Repositioned    Home Living Family/patient expects to be discharged to:: Private residence Living Arrangements: Alone Available Help at Discharge: Family;Available PRN/intermittently Type of Home: Apartment Home Access: Level entry     Home Layout: One level Home Equipment: None      Prior Function Level of Independence: Independent   Gait / Transfers Assistance Needed: pt was furniture walking to get to the bathroom  ADL's / Homemaking Assistance Needed: Pt has primarily being laying on the couch for the last 2 months. Sister brings in meals periodically        Hand Dominance   Dominant Hand: Right    Extremity/Trunk  Assessment   Upper Extremity Assessment Upper Extremity Assessment: Overall WFL for tasks assessed    Lower Extremity Assessment Lower Extremity Assessment: Generalized weakness    Cervical / Trunk Assessment Cervical / Trunk Assessment: Normal  Communication   Communication: No difficulties  Cognition Arousal/Alertness: Awake/alert Behavior During Therapy: WFL for tasks assessed/performed Overall Cognitive Status: Within Functional Limits for tasks assessed                                        General Comments      Exercises     Assessment/Plan    PT Assessment Patient needs continued PT services  PT Problem List Decreased strength;Decreased activity tolerance;Decreased balance;Decreased mobility;Pain       PT Treatment Interventions DME instruction;Gait training;Functional mobility training;Therapeutic activities;Therapeutic exercise;Balance training;Patient/family education    PT Goals (Current goals can be found in the Care Plan section)  Acute Rehab PT Goals Patient Stated Goal: agrees he is unsafe to go home with only intermittent assist PT Goal Formulation: With patient Time For Goal Achievement: 11/14/20 Potential to Achieve Goals: Fair    Frequency Min 3X/week   Barriers to discharge        Co-evaluation               AM-PAC PT "6 Clicks" Mobility  Outcome Measure Help needed turning from your back to your side while in a flat bed without using bedrails?: None Help needed moving from lying on your back to sitting on the side of a flat bed without using bedrails?: None Help needed moving to and from a bed to a chair (including a wheelchair)?: A Little Help needed standing up from a chair using your arms (e.g., wheelchair or bedside chair)?: A Little Help needed to walk in hospital room?: A Little Help needed climbing 3-5 steps with a railing? : A Lot 6 Click Score: 19    End of Session Equipment Utilized During Treatment:  Oxygen Activity Tolerance: Patient limited by fatigue Patient left: in bed;with call bell/phone within reach;with family/visitor present Nurse Communication: Mobility status PT Visit Diagnosis: Pain;Difficulty in walking, not elsewhere classified (R26.2);Other abnormalities of gait and mobility (R26.89);Unsteadiness on feet (R26.81);Muscle weakness (generalized) (M62.81) Pain - Right/Left: Left Pain - part of body: Leg    Time: 5056-9794 PT Time Calculation (min) (ACUTE ONLY): 25 min   Charges:   PT Evaluation $PT Eval Moderate Complexity: 1 Mod          Wyona Almas, PT, DPT Acute Rehabilitation Services Pager [phone] Office [phone]   Deno Etienne 10/31/2020, 12:55 PM

## 2020-10-31 NOTE — Progress Notes (Signed)
Inpatient Diabetes Program Recommendations  AACE/ADA: New Consensus Statement on Inpatient Glycemic Control (2015)  Target Ranges:  Prepandial:   less than 140 mg/dL      Peak postprandial:   less than 180 mg/dL (1-2 hours)      Critically ill patients:  140 - 180 mg/dL   Lab Results  Component Value Date   GLUCAP 102 (H) 10/31/2020   HGBA1C 7.1 (H) 10/31/2020    Review of Glycemic Control  Diabetes history: No prior hx Current orders for Inpatient glycemic control: Novolog 0-6 units tid correction  Inpatient Diabetes Program Recommendations:   Noted A1c 5.8 08/02/20 and now 7.1. Received consult to discuss new onset DM2 with patient and ordered Living Well with Diabetes book along with patient education @ bedside and dietician consult. Will plan to speak with patient.  Thank you, Nani Gasser. Cinda Hara, RN, MSN, CDE  Diabetes Coordinator Inpatient Glycemic Control Team Team Pager [phone] (8am-5pm) 10/31/2020 10:56 AM

## 2020-11-01 ENCOUNTER — Other Ambulatory Visit: Payer: BC Managed Care – PPO

## 2020-11-01 ENCOUNTER — Encounter (HOSPITAL_COMMUNITY): Payer: Self-pay | Admitting: Family Medicine

## 2020-11-01 ENCOUNTER — Ambulatory Visit: Payer: BC Managed Care – PPO | Admitting: Cardiology

## 2020-11-01 DIAGNOSIS — I5042 Chronic combined systolic (congestive) and diastolic (congestive) heart failure: Secondary | ICD-10-CM | POA: Diagnosis not present

## 2020-11-01 DIAGNOSIS — Z9889 Other specified postprocedural states: Secondary | ICD-10-CM | POA: Diagnosis not present

## 2020-11-01 DIAGNOSIS — Z66 Do not resuscitate: Secondary | ICD-10-CM | POA: Diagnosis not present

## 2020-11-01 DIAGNOSIS — Z7189 Other specified counseling: Secondary | ICD-10-CM | POA: Diagnosis not present

## 2020-11-01 DIAGNOSIS — I70229 Atherosclerosis of native arteries of extremities with rest pain, unspecified extremity: Secondary | ICD-10-CM | POA: Diagnosis not present

## 2020-11-01 LAB — CBC WITH DIFFERENTIAL/PLATELET
Abs Immature Granulocytes: 0.15 10*3/uL — ABNORMAL HIGH (ref 0.00–0.07)
Basophils Absolute: 0 10*3/uL (ref 0.0–0.1)
Basophils Relative: 0 %
Eosinophils Absolute: 0.3 10*3/uL (ref 0.0–0.5)
Eosinophils Relative: 2 %
HCT: 34 % — ABNORMAL LOW (ref 39.0–52.0)
Hemoglobin: 11.4 g/dL — ABNORMAL LOW (ref 13.0–17.0)
Immature Granulocytes: 1 %
Lymphocytes Relative: 4 %
Lymphs Abs: 0.8 10*3/uL (ref 0.7–4.0)
MCH: 28.6 pg (ref 26.0–34.0)
MCHC: 33.5 g/dL (ref 30.0–36.0)
MCV: 85.2 fL (ref 80.0–100.0)
Monocytes Absolute: 0.9 10*3/uL (ref 0.1–1.0)
Monocytes Relative: 5 %
Neutro Abs: 16.1 10*3/uL — ABNORMAL HIGH (ref 1.7–7.7)
Neutrophils Relative %: 88 %
Platelets: 288 10*3/uL (ref 150–400)
RBC: 3.99 MIL/uL — ABNORMAL LOW (ref 4.22–5.81)
RDW: 16.6 % — ABNORMAL HIGH (ref 11.5–15.5)
WBC: 18.2 10*3/uL — ABNORMAL HIGH (ref 4.0–10.5)
nRBC: 0 % (ref 0.0–0.2)

## 2020-11-01 LAB — BASIC METABOLIC PANEL
Anion gap: 18 — ABNORMAL HIGH (ref 5–15)
BUN: 111 mg/dL — ABNORMAL HIGH (ref 8–23)
CO2: 13 mmol/L — ABNORMAL LOW (ref 22–32)
Calcium: 8.9 mg/dL (ref 8.9–10.3)
Chloride: 103 mmol/L (ref 98–111)
Creatinine, Ser: 3.75 mg/dL — ABNORMAL HIGH (ref 0.61–1.24)
GFR, Estimated: 18 mL/min — ABNORMAL LOW (ref >60)
Glucose, Bld: 119 mg/dL — ABNORMAL HIGH (ref 70–99)
Potassium: 3.6 mmol/L (ref 3.5–5.1)
Sodium: 134 mmol/L — ABNORMAL LOW (ref 135–145)

## 2020-11-01 LAB — GLUCOSE, CAPILLARY
Glucose-Capillary: 102 mg/dL — ABNORMAL HIGH (ref 70–99)
Glucose-Capillary: 111 mg/dL — ABNORMAL HIGH (ref 70–99)
Glucose-Capillary: 128 mg/dL — ABNORMAL HIGH (ref 70–99)
Glucose-Capillary: 140 mg/dL — ABNORMAL HIGH (ref 70–99)
Glucose-Capillary: 181 mg/dL — ABNORMAL HIGH (ref 70–99)
Glucose-Capillary: 79 mg/dL (ref 70–99)

## 2020-11-01 LAB — PHOSPHORUS: Phosphorus: 6.2 mg/dL — ABNORMAL HIGH (ref 2.5–4.6)

## 2020-11-01 MED ORDER — NALOXONE HCL 0.4 MG/ML IJ SOLN: 0.4000 mg | INTRAMUSCULAR | Status: DC | PRN

## 2020-11-01 MED ORDER — ENSURE ENLIVE PO LIQD
237.0000 mL | Freq: Two times a day (BID) | ORAL | Status: DC
  Administered 2020-11-02 – 2020-11-07 (×10): 237 mL via ORAL
  Filled 2020-11-01 (×3): qty 237

## 2020-11-01 MED ORDER — FENTANYL 50 MCG/ML IV PCA SOLN
INTRAVENOUS | Status: DC
  Administered 2020-11-01: 15 ug via INTRAVENOUS
  Administered 2020-11-02: 7.8 mL via INTRAVENOUS
  Administered 2020-11-02: 3.6 mL via INTRAVENOUS
  Administered 2020-11-02: 15 ug via INTRAVENOUS
  Administered 2020-11-02: 4.8 mL via INTRAVENOUS
  Administered 2020-11-02: 15 ug via INTRAVENOUS
  Administered 2020-11-02: 1000 ug via INTRAVENOUS
  Administered 2020-11-03: 4.2 mL via INTRAVENOUS
  Administered 2020-11-03: 17.1 mL via INTRAVENOUS
  Administered 2020-11-03: 4.2 mL via INTRAVENOUS
  Administered 2020-11-03: 405 ug via INTRAVENOUS
  Administered 2020-11-03: 1000 ug via INTRAVENOUS
  Administered 2020-11-04: 20 mL via INTRAVENOUS
  Administered 2020-11-04: 180 ug via INTRAVENOUS
  Administered 2020-11-04: 390 ug via INTRAVENOUS
  Administered 2020-11-04: 219 ug via INTRAVENOUS
  Administered 2020-11-05: 220 ug via INTRAVENOUS
  Administered 2020-11-05: 225 ug via INTRAVENOUS
  Administered 2020-11-05: 135 ug via INTRAVENOUS
  Administered 2020-11-05: 165 ug via INTRAVENOUS
  Administered 2020-11-05: 255 ug via INTRAVENOUS
  Administered 2020-11-06: 225 ug via INTRAVENOUS
  Administered 2020-11-06: 50 ug via INTRAVENOUS
  Administered 2020-11-06: 285 ug via INTRAVENOUS
  Administered 2020-11-06: 135 ug via INTRAVENOUS
  Administered 2020-11-07: 45 ug via INTRAVENOUS
  Filled 2020-11-01 (×8): qty 20

## 2020-11-01 MED ORDER — DIPHENHYDRAMINE HCL 50 MG/ML IJ SOLN: 12.5000 mg | Freq: Four times a day (QID) | INTRAMUSCULAR | Status: DC | PRN

## 2020-11-01 MED ORDER — ADULT MULTIVITAMIN W/MINERALS CH
1.0000 | ORAL_TABLET | Freq: Every day | ORAL | Status: DC
  Administered 2020-11-01 – 2020-11-15 (×14): 1 via ORAL
  Filled 2020-11-01 (×14): qty 1

## 2020-11-01 MED ORDER — DIPHENHYDRAMINE HCL 12.5 MG/5ML PO ELIX
12.5000 mg | ORAL_SOLUTION | Freq: Four times a day (QID) | ORAL | Status: DC | PRN
  Filled 2020-11-01: qty 5

## 2020-11-01 MED ORDER — CLOPIDOGREL BISULFATE 75 MG PO TABS
300.0000 mg | ORAL_TABLET | Freq: Once | ORAL | Status: AC
  Administered 2020-11-01: 300 mg via ORAL
  Filled 2020-11-01: qty 4

## 2020-11-01 MED ORDER — SODIUM CHLORIDE 0.9% FLUSH
9.0000 mL | INTRAVENOUS | Status: DC | PRN
  Administered 2020-11-04: 9 mL via INTRAVENOUS

## 2020-11-01 MED ORDER — ONDANSETRON HCL 4 MG/2ML IJ SOLN
4.0000 mg | Freq: Four times a day (QID) | INTRAMUSCULAR | Status: DC | PRN
  Administered 2020-11-06: 4 mg via INTRAVENOUS

## 2020-11-01 MED ORDER — CLOPIDOGREL BISULFATE 75 MG PO TABS
75.0000 mg | ORAL_TABLET | Freq: Every day | ORAL | Status: DC
  Administered 2020-11-02 – 2020-11-15 (×13): 75 mg via ORAL
  Filled 2020-11-01 (×13): qty 1

## 2020-11-01 NOTE — TOC Initial Note (Addendum)
Transition of Care [facility]) - Initial/Assessment Note    Patient Details  Name: Mike [name] MRN: [id number] Date of Birth: 09-Aug-1961  Transition of Care [facility] CM/SW Contact:    Trula Ore, [place] Phone Number: 11/01/2020, 2:37 PM  Clinical Narrative:                  CSW received consult for possible SNF placement at time of discharge. CSW spoke with patient at bedside regarding PT recommendation of SNF placement at time of discharge. Patient expressed understanding of PT recommendation and is agreeable to SNF placement at time of discharge. Patient gave CSW permission to fax out initiital referral near [place] area. Patient has received the COVID vaccines.Patient gave CSW permission to discuss his care with his sister Jeani Hawking. Patient expressed being hopeful for rehab and to feel better soon. No further questions reported at this time. CSW to continue to follow and assist with discharge planning needs.  Expected Discharge Plan: Skilled Nursing Facility Barriers to Discharge: Continued Medical Work up   Patient Goals and CMS Choice Patient states their goals for this hospitalization and ongoing recovery are:: to go to SNF CMS Medicare.gov Compare Post Acute Care list provided to:: Patient Choice offered to / list presented to : Patient  Expected Discharge Plan and Services Expected Discharge Plan: [place]       Living arrangements for the past 2 months: Single Family Home                                      Prior Living Arrangements/Services Living arrangements for the past 2 months: Single Family Home Lives with:: Self Patient language and need for interpreter reviewed:: Yes Do you feel safe going back to the place where you live?: No   SNF  Need for Family Participation in Patient Care: Yes (Comment) Care giver support system in place?: Yes (comment)   Criminal Activity/Legal Involvement Pertinent to Current Situation/Hospitalization: No -  Comment as needed  Activities of Daily Living Home Assistive Devices/Equipment: None ADL Screening (condition at time of admission) Patient's cognitive ability adequate to safely complete daily activities?: Yes Is the patient deaf or have difficulty hearing?: No Does the patient have difficulty seeing, even when wearing glasses/contacts?: No Does the patient have difficulty concentrating, remembering, or making decisions?: No Patient able to express need for assistance with ADLs?: Yes Does the patient have difficulty dressing or bathing?: No Independently performs ADLs?: Yes (appropriate for developmental age) Does the patient have difficulty walking or climbing stairs?: Yes Weakness of Legs: Left Weakness of Arms/Hands: None  Permission Sought/Granted Permission sought to share information with : Case Manager, Family Supports, Chartered certified accountant granted to share information with : Yes, Verbal Permission Granted  Share Information with NAME: Jeani Hawking  Permission granted to share info w AGENCY: SNF  Permission granted to share info w Relationship: sister  Permission granted to share info w Contact Information: Jeani Hawking [phone]  Emotional Assessment Appearance:: Appears stated age Attitude/Demeanor/Rapport: Gracious Affect (typically observed): Calm Orientation: : Oriented to Self, Oriented to Place, Oriented to  Time, Oriented to Situation Alcohol / Substance Use: Not Applicable Psych Involvement: No (comment)  Admission diagnosis:  Elevated troponin [R77.8] PAD (peripheral artery disease) ([place]) [I73.9] Limb ischemia [I99.8] Chest pain [R07.9] Chest pain, unspecified type [R07.9] Acute on chronic HFrEF (heart failure with reduced ejection fraction) (HCC) [I50.23] Heart failure, unspecified HF chronicity,  unspecified heart failure type [facility]) [I50.9] Patient Active Problem List   Diagnosis Date Noted  . Chest pain 10/31/2020  . Acute on chronic HFrEF (heart  failure with reduced ejection fraction) ([place])   . Goals of care, counseling/discussion   . Palliative care by specialist   . DNR (do not resuscitate)   . Critical limb ischemia with history of revascularization of same extremity ([place]) 10/30/2020  . Hyperglycemia 10/30/2020  . Tobacco abuse 10/30/2020  . Leukocytosis 10/30/2020  . Chronic combined systolic and diastolic CHF (congestive heart failure) ([place]) 10/30/2020  . Ischemic cardiomyopathy 10/24/2020  . AKI (acute kidney injury) ([place]) 10/17/2020  . PAD (peripheral artery disease) ([place]) 10/16/2020  . Critical lower limb ischemia ([place]) 10/05/2020  . Rest pain of left upper extremity due to atherosclerosis ([place]) 10/05/2020  . Severe claudication ([place]) 09/20/2020  . Paresthesia of both lower extremities 09/20/2020  . H/O cardiac arrest 09/20/2020  . H/O acute myocardial infarction 08/14/2020  . Snoring 08/14/2020  . Sinus pause 08/14/2020  . Mixed hyperlipidemia 08/14/2020  . Tobacco dependence 08/14/2020  . Coronary artery disease involving native coronary artery of native heart without angina pectoris 08/10/2020  . HFrEF (heart failure with reduced ejection fraction) ([place]) 08/10/2020  . Moderate mitral regurgitation 08/10/2020  . STEMI (ST elevation myocardial infarction) ([place]) 08/02/2020  . Accelerated hypertension 08/01/2020   PCP:  Patient, No Pcp Per Pharmacy:   [id number] [place], [place] [address] [place] [address] Phone: [phone] Fax: [phone]     Social Determinants of Health (SDOH) Interventions    Readmission Risk Interventions No flowsheet data found.

## 2020-11-01 NOTE — Progress Notes (Signed)
Subjective:  Continues ot have severe resting pain in left leg  Objective:  Vital Signs in the last 24 hours: Temp:  [97.5 F (36.4 C)-97.9 F (36.6 C)] 97.5 F (36.4 C) (11/04 0451) Pulse Rate:  [66-75] 69 (11/04 0451) Resp:  [16-20] 18 (11/04 0451) BP: (94-117)/(54-95) 98/60 (11/04 0451) SpO2:  [97 %-100 %] 100 % (11/04 0451) Weight:  [67.1 kg] 67.1 kg (11/04 0042)  Intake/Output from previous day: 11/03 0701 - 11/04 0700 In: 598 [P.O.:598] Out: [place] [Urine:1625]  Physical Exam  Vitals and nursing note reviewed.  Constitutional:      Appearance: He is well-developed.  HENT:     Head: Normocephalic and atraumatic.  Eyes:     Conjunctiva/sclera: Conjunctivae normal.     Pupils: Pupils are equal, round, and reactive to light.  Neck:     Vascular: No JVD.  Cardiovascular:     Rate and Rhythm: Normal rate and regular rhythm.     Pulses: Intact distal pulses.          Femoral pulses are 2+ on the right side and 0 on the left side.      Popliteal pulses are 1+ on the right side and 0 on the left side.       Dorsalis pedis pulses are 0 on the right side and 0 on the left side.       Posterior tibial pulses are 0 on the right side and 0 on the left side.       Extensive mottling left lower extremity    Heart sounds: No murmur heard.   Pulmonary:     Effort: Pulmonary effort is normal.    Breath sounds: Normal breath sounds. No wheezing or rales.  Abdominal:     General: Bowel sounds are normal.     Palpations: Abdomen is soft.     Tenderness: There is no rebound.     Comments: Hepatomegaly  Musculoskeletal:        General: No tenderness. Normal range of motion.     Left lower leg: No edema.     Comments: Left thigh muscle wasting  Lymphadenopathy:     Cervical: No cervical adenopathy.  Skin:    General: Skin is warm and dry.  Neurological:     Mental Status: He is alert and oriented to person, place, and time.     Cranial Nerves: No cranial nerve deficit.    Lab  Results: BMP Recent Labs    09/25/20 0903 10/17/20 0825 10/22/20 1054 10/22/20 1054 10/26/20 1004 10/30/20 1652 10/31/20 0955  NA 140   < > CANCELED   < > 135 136 132*  K 4.5   < > CANCELED   < > 3.9 4.1 3.5  CL 108*   < > CANCELED   < > 98 102 103  CO2 18*   < > CANCELED   < > 11* 11* 11*  GLUCOSE 102*   < > 135*   < > 210* 205* 111*  BUN 15   < > 67*   < > 79* 104* 113*  CREATININE 1.16   < > 6.34*   < > 5.36* 4.62* 4.26*  CALCIUM 9.4   < > CANCELED   < > 9.1 9.3 8.9  GFRNONAA 68   < > 9*   < > 11* 14* 15*  GFRAA 78  --  10*  --  12*  --   --    < > = values in this  interval not displayed.    CBC Recent Labs  Lab 10/31/20 0429  WBC 19.5*  RBC 4.13*  HGB 11.4*  HCT 34.6*  PLT 296  MCV 83.8  MCH 27.6  MCHC 32.9  RDW 16.6*  LYMPHSABS 1.0  MONOABS 1.1*  EOSABS 0.2  BASOSABS 0.0    HEMOGLOBIN A1C Lab Results  Component Value Date   HGBA1C 7.1 (H) 10/31/2020   MPG 157.07 10/31/2020    Cardiac Panel (last 3 results) Recent Labs    10/17/20 1413  CKTOTAL 486*    BNP (last 3 results) Recent Labs    08/03/20 0644 10/22/20 1054 10/30/20 1820  BNP 751.4* 261.5* 488.4*    TSH Recent Labs    10/31/20 0429  TSH 10.137*    Lipid Panel     Component Value Date/Time   CHOL 95 (L) 09/11/2020 0806   TRIG 76 09/11/2020 0806   HDL 30 (L) 09/11/2020 0806   CHOLHDL 3.2 09/11/2020 0806   CHOLHDL 4.5 08/02/2020 0707   VLDL 15 08/02/2020 0707   LDLCALC 49 09/11/2020 0806     Hepatic Function Panel Recent Labs    08/02/20 0026 09/25/20 0903 10/31/20 0429  PROT 7.5 7.0 7.2  ALBUMIN 4.2 4.3 2.5*  AST 29 22 235*  ALT 22 25 123*  ALKPHOS 85 82 81  BILITOT 1.0 0.8 0.9  BILIDIR  --   --  0.2  IBILI  --   --  0.7   CARDIAC STUDIES:  EKG 10/30/2020: Sinus rhythm 73 bpm Nonspecific ST elevation inferior leads. Not STEMI  PV intervention 10/16/2020: Successful intravascular lithotripsy, PTCA and stenting 7.0X39 mm balloon expandable Viabahn  VBX stent Rt common iliac artery  7.0X60 mm self expanding Absolute Pro stent Rt distal iliac artery  No named vessels at ankle level in both lower extremities.  Will consult vascular surgery for Rt fem-to-left profunda bypass given left foot critical limb ischemia.  Abdominal Aortic Duplex 09/21/2020:  Moderate plaque noted in the proximal, mid and distal aorta. There is an  ulcerated plaque noted in the distal abdominal aorta. No AAA. Normal iliac  artery velocity.   Abdominal Aortic Duplex 09/21/2020:  Moderate plaque noted in the proximal, mid and distal aorta. There is an  ulcerated plaque noted in the distal abdominal aorta. No AAA. Normal iliac  artery velocity.  Lower Extremity Arterial Duplex 09/21/2020:  Theright SFA is occluded in the proximal segment with reconstitution at  the level of the popliteal artery with diffuse monophasic waveform below  the knee. Right profunda femoral artery has >50% stenosis. There is  moderate mixed plaque noted throughout the right lower extremity.  Monophasic waveform throughout the left lower extremity, indicates  significant proximal disease (iliac artery).  Left SFA is occluded in the proximal segment and reconstitutes just above popliteal artery and diffuse dampened monophasic waveform throughout the lower extremity below the  knee. There is moderate mixed plaque throughout the left lower extremity.   This exam reveals severely decreased perfusion of the right lower  extremity, noted at the dorsalis pedis artery level (ABI 0.42) and  critically decreased perfusion of the left lower extremity, noted at the  dorsalis pedis and post tibial artery level (ABI 0.03).   Echocardiogram 09/11/2020:  Left ventricle cavity is moderately dilated. Moderate concentric  hypertrophy of the left ventricle. Moderate global and severe  inferolateral hypokinesis. LVEF 15-20%. Grade 1 diastolic dysfunction.  Normal left atrial pressure.  Calculated EF 15%.  Mild to moderate mitral regurgitation.  Inadequate TR jet to estimate pulmonary artery systolic pressure. Normal  right atrial pressure.  Compared to previous study in 03/2020, LVEF is reduced from 30-35%. Mitral  regurgitation is marginally improved.   EKG 08/14/2020: Sinus rhythm 81 bpm Old inferior and anterior infarct  Coronary intervention 08/03/2020: LM: Distal 40% stenosis LAD:Mid 30% disease LCx: Subtotally occluded OM2, prox LCx 70% stenosis Successful percutaneous coronary intervention OM2-Prox LCx PTCA and overlapping stents placement  2.5 X 38 mm and 2.5 X 18 mm Resolute Onyx drug-eluting stents 100%--->0% stenosis. TIMI flow 0-->III Small caliber distal vessel with moderate diffuse disease RCA: Prox 30% stenosis. Patent mid RCA sttent 2.5 X 26 mm Resolute Onyx drug-eluting stent  LVEDP 42 mmHg  Echocardiogram 08/02/2020: 1. Moderate global and severe inferolateral hypokinesis. Left ventricular  ejection fraction, by estimation, is 30 to 35%. The left ventricle has  normal function. The left ventricle demonstrates regional wall motion  abnormalities (see scoring  diagram/findings for description). There is mild left ventricular  hypertrophy. Left ventricular diastolic parameters are consistent with  Grade II diastolic dysfunction (pseudonormalization).  2. Right ventricular systolic function is low normal. The right  ventricular size is normal.  3. Left atrial size was mildly dilated.  4. The mitral valve is grossly normal. Moderate mitral valve  regurgitation.  5. The aortic valve is tricuspid. Aortic valve regurgitation is not  visualized. Mild aortic valve sclerosis is present, with no evidence of  aortic valve stenosis.  6. The inferior vena cava is dilated in size with >50% respiratory  variability, suggesting right atrial pressure of 8 mmHg.   Coronary intervention 08/01/2020: LM: Distal  30% stenosis LAD:Mid 30% disease LCx: Subtotally occluded OM2, bridging left-to-left and right-to-left collaterals RCA: Prox 30% stenosis. Mid 100% occlusion Successful percutaneous coronary intervention mid RCA PTCA and stent placement 2.5 X 26 mm Resolute Onyx drug-eluting stent 100%--->0% stenosis. TIMI flow 0-->III  Assessment & Recommendations:  Mike [name] a59 y.o.Caucasian male with CAD, culprit (RCA) and nonculprit (LCx) PCI after STEMI 07/2020, HFrEF, moderate MR, h/o cardiac arrest 2/2 Torsades post PCI during index hospitalization, PAD with critical limb ischemia s/p Rt iliac revascularization, residual critical limb ischemia LLE with no immediate targets for revascularization, severe bilateral renal artery stenoses, acute kidney injury following peripheral arteriogram on10/19/2021.   Shortness of breath: Likely combination of acute on chronic systolic heart failure, acute kidney injury, deconditioning; relatively stable. Chest x-ray is clean, no acute pulmonary edema. BNP mildly elevated. Monitor intake and output, daily weights. Given his acute kidney injury, avoiding Lasix unless patient has obvious pulmonary edema.  Troponin elevation: Likely supply demand mismatch in the setting of acute systolic heart failure, acute kidney injury. Do not suspect acute coronary syndrome.   Transaminitis: Likely due to congestive hepatopathy, as well related to statin.  Albumin of 2.5 also reflects malnutrition in the setting of congestive hepatopathy and systolic heart failure. In the setting of acute kidney injury, Crestor 40 mg dose needs to be adjusted. For the time being, I have stopped his Crestor.  Acute kidney injury: Cr improving. Phos mildly elevated, but otherwise, not in uremia Consider nephrology consult  PAD: Successful revascularization of right common iliac and right external iliac artery. Un-revascularized severe PAD with critical  limb ischemia left lower extremity. Given patient's severely CAD, HFrEF, surgical risk for revascularization is very high. Even with attempted percutaneous revascularization of left iliac artery, I do not think an his amputation can be avoided.  Risk of periprocedural kidney injury remains very high  even with small amount of contrast use, given his systolic heart failure and bilateral renal artery stenosis. Only realistic option for pain control could be above-the-knee amputation.  His perioperative cardiac risk remains elevated owing to his systolic heart failure, as well as advanced kidney disease, bilateral renal artery stenosis and malnutrition, but relatively lower than a vascular bypass surgery. Appreciate Dr. Stephens Shire input. In order to reduce perioperative bleeding risk, I have switched his Brilinta to Plavix, after discussion with Dr. Trula Slade. Potentially looking at above the knee amputation next week. Family-sister Jeani Hawking and brother Fara Olden has questions regarding the surgery and post-operative care, which I have encouraged them to discuss with Dr. Trula Slade. In the meantime, we should optimize his renal function, and nutrition with anticipation of possible surgery next week. Should continue social nursing home placement. Logistically, he may end up requiring continued hospital stay until the surgery, followed by nursing home placement.  Goals of care : Patient unequivocally expressed his wishes to be DNR. DNR status can be rescinded  for the time of surgery. Recommends DNR, do not think he would benefit from further discussion regarding ICD. We will finalize this after further discussion with the patient.    Nigel Mormon, MD Pager: [phone] Office: [phone]

## 2020-11-01 NOTE — Progress Notes (Signed)
Initial Nutrition Assessment  DOCUMENTATION CODES:   Not applicable  INTERVENTION:   -Ensure Enlive po BID, each supplement provides 350 kcal and 20 grams of protein -MVI with minerals daily  NUTRITION DIAGNOSIS:   Increased nutrient needs related to wound healing as evidenced by estimated needs.  GOAL:   Patient will meet greater than or equal to 90% of their needs  MONITOR:   PO intake, Supplement acceptance, Weight trends, Labs, Skin, I & O's  REASON FOR ASSESSMENT:   Consult Diet education  ASSESSMENT:   59 y.o. male with medical history significant of CAD, culprit (RCA) and nonculprit (LCx) PCI after STEMI 07/2020, HFrEF, moderate MR, h/o cardiac arrest 2/2 Torsades post PPCI during index hospitalization, PAD with critical limb ischemia  Pt admitted with critical limb ischemia.   Reviewed I/O's: -1 L x 24 hours and _1.1 L since admission  UOP: 1.6 L x  24 hours  Attempted to pt, however, receiving nursing care at time of attempted visit.   Pt with variable oral intake; noted meal completion 5-100%.  Reviewed wt hx;pt has experienced a 18.4% wt loss over the past 3 months, which is significant for time frame.   Palliative care and vascular surgery following; pt would like to consider amputation (recommending lt AKA).   Pt with poor oral intake and would benefit from nutrient dense supplement. One Ensure Enlive supplement provides 350 kcals, 20 grams protein, and 44-45 grams of carbohydrate vs one Glucerna shake supplement, which provides 220 kcals, 10 grams of protein, and 26 grams of carbohydrate. Given pt's hx of DM, RD will reassess adequacy of PO intake, CBGS, and adjust supplement regimen as appropriate at follow-up.   Pt at high risk for malnutrition, however, unable to identify at this time.   Lab Results  Component Value Date   HGBA1C 7.1 (H) 10/31/2020   PTA DM medications are none.   Labs reviewed: CBGS: 79-166 (inpatient orders for glycemic control  are 0-6 units insulin aspart every 4 hours).   Diet Order:   Diet Order            Diet heart healthy/carb modified Room service appropriate? Yes; Fluid consistency: Thin  Diet effective now                 EDUCATION NEEDS:   No education needs have been identified at this time  Skin:  Skin Assessment: Skin Integrity Issues: Skin Integrity Issues:: Other (Comment) Other: wound to lt seconnd toe, MASD lt sacrum  Last BM:  10/31/20  Height:   Ht Readings from Last 1 Encounters:  10/31/20 6' (1.829 m)    Weight:   Wt Readings from Last 1 Encounters:  11/01/20 67.1 kg    Ideal Body Weight:  80.9 kg  BMI:  Body mass index is 20.06 kg/m.  Estimated Nutritional Needs:   Kcal:  2150-2350  Protein:  120-135 grams  Fluid:  > 2 L    Loistine Chance, RD, LDN, [place] Registered Dietitian II Certified Diabetes Care and Education Specialist Please refer to [facility] for RD and/or RD on-call/weekend/after hours pager

## 2020-11-01 NOTE — Progress Notes (Signed)
  PCA pump started.

## 2020-11-01 NOTE — Progress Notes (Signed)
This chaplain responded by phone to page from [place] [phone]. The chaplain informed Jeani Hawking the hospital does not provide a notary for Pt. durable POA in the hospital.  The chaplain informed [facility] volunteers can be used for witnesses if she provides the notary. F/U spiritual care is available as needed.

## 2020-11-01 NOTE — Progress Notes (Signed)
Patient not able to urinate, Bladder scan done= >536mL, straight cath= 859mL.

## 2020-11-01 NOTE — Plan of Care (Signed)
  Problem: Health Behavior/Discharge Planning: Goal: Ability to manage health-related needs will improve Outcome: Progressing   Problem: Activity: Goal: Risk for activity intolerance will decrease Outcome: Progressing   Problem: Pain Managment: Goal: General experience of comfort will improve Outcome: Progressing   

## 2020-11-01 NOTE — Progress Notes (Signed)
[facility] PROGRESS NOTE    Progress Note  Mike [name]  YIF:[id number] DOB: 1961/01/19 DOA: 10/30/2020 PCP: Patient, No Pcp Per     Brief Narrative:   Mike [name] is an 59 y.o. male past medical history significant for STEMI in 08/17/2020 with moderate MR cardiac arrest secondary to torsade post PCI, he has no known resolve ST segment elevation in the inferior leads since his STEMI in 08/17/2020, critical limb ischemia with right iliac revascularization with residual limb ischemia in the left lower extremity, severe bilateral renal artery stenosis, acute kidney injury following arteriogram on 10/16/2020 is into the hospital complaining of shortness of breath and inability to walk due to severe leg pain.  Cardiology was consulted and unfortunately he has an extremely high risk of limb loss and in view of his underlying chronic kidney disease stage IV chronic systolic heart failure that is due to ischemic cardiomyopathy he has an extremely high risk of mortality.  Urology recommended to consult  Care to move towards comfort care.   Assessment/Plan:   Critical limb ischemia with pain at rest on the left lower extremity with no revascularization at this point, vascular surgery consult was obtained on 10/17/2020: Cardiology met with family and patient after having a long discussion with them he related he would like to proceed with the surgery, despite being a very high risk for perioperative complications. Vascular surgery was consulted dated he is high risk for revascularization with an above-the-knee amputation, they would like them to have off Brilinta you still be on aspirin and Plavix and are thinking about an above-the-knee amputation early next week if the patient agrees with plan. His narcotics were increased yesterday his pain is not controlled given PCA pump. We had a long conversation really understand this he is very high risk of complications renal failure and  cardiopulmonary complications knowing this risk he would like to proceed with surgery.  Ongoing tobacco abuse: The admitting physician spoke to him and at this point in time he is interested in quitting and nicotine patch was ordered.  Hyperlipidemia: Continue statins.  Coronary artery disease: Continue statins, beta-blockers, discontinue Brilinta he was started on aspirin and Plavix. Patient is chronic recurrent chest pain troponins are stable EKG changes are chronic.  Chronic kidney disease stage IV: Creatinine appears to be at baseline.  Hyperglycemia/diabetes mellitus type 2: No prior history of diabetes, globin A1c of 7.1, will continue sliding scale insulin.  History of ischemic cardiomyopathy: Appears to be euvolemic blood pressure is soft holding Lasix.  Leukocytosis: He has remained afebrile will monitor for any signs of infection, his cardiologist was consulted recommended moving towards comfort care.    DVT prophylaxis: lovenxo Family Communication:none Status is: Inpatient  Remains inpatient appropriate because:Hemodynamically unstable   Dispo: The patient is from: Home              Anticipated d/c is to: Home              Anticipated d/c date is: 2 days              Patient currently is not medically stable to d/c.        Code Status:     Code Status Orders  (From admission, onward)         Start     Ordered   10/31/20 0235  Limited resuscitation (code)  Continuous       Question Answer Comment  In the event of cardiac or respiratory  ARREST: Initiate Code Blue, Call Rapid Response Yes   In the event of cardiac or respiratory ARREST: Perform CPR Yes   In the event of cardiac or respiratory ARREST: Perform Intubation/Mechanical Ventilation No   In the event of cardiac or respiratory ARREST: Use NIPPV/BiPAp only if indicated Yes   In the event of cardiac or respiratory ARREST: Administer ACLS medications if indicated Yes   In the event of cardiac  or respiratory ARREST: Perform Defibrillation or Cardioversion if indicated Yes      10/31/20 0234        Code Status History    Date Active Date Inactive Code Status Order ID Comments User Context   10/16/2020 1912 10/18/2020 2008 Full Code [id number]  Nigel Mormon, MD Inpatient   08/02/2020 0035 08/04/2020 1745 Full Code [id number]  Nigel Mormon, MD Inpatient   Advance Care Planning Activity    Advance Directive Documentation     Most Recent Value  Type of Advance Directive Living will, Healthcare Power of [place]  Pre-existing out of facility DNR order (yellow form or pink MOST form) --  "MOST" Form in Place? --        IV Access:    Peripheral IV   Procedures and diagnostic studies:   DG Chest Portable 1 View  Result Date: 10/30/2020 CLINICAL DATA:  Chest pain EXAM: PORTABLE CHEST 1 VIEW COMPARISON:  08/03/2020 FINDINGS: Single frontal view of the chest demonstrates stenting artifact from external cardiac device. Cardiac silhouette is unremarkable. No airspace disease, effusion, or pneumothorax. No acute bony abnormalities. IMPRESSION: 1. No acute intrathoracic process. Electronically Signed   By: Randa Ngo M.D.   On: 10/30/2020 17:01     Medical Consultants:    None.  Anti-Infectives:   none  Subjective:    Mike [name] continues to have leg pain narcotics not helping.  Objective:    Vitals:   10/31/20 2122 11/01/20 0042 11/01/20 0108 11/01/20 0451  BP: (!) 94/56  (!) 94/54 98/60  Pulse: 75  66 69  Resp: _0 Temp: 97.9 F (36.6 C)  97.7 F (36.5 C) (!) 97.5 F (36.4 C)  TempSrc: Oral  Oral Oral  SpO2: 97%  100% 100%  Weight:  67.1 kg    Height:       SpO2: 100 % O2 Flow Rate (L/min): 2 L/min   Intake/Output Summary (Last 24 hours) at 11/01/2020 0923 Last data filed at 11/01/2020 0816 Gross per 24 hour  Intake 838 ml  Output 1625 ml  Net -787 ml   Filed Weights   10/31/20 0410 11/01/20 0042  Weight: 67.4 kg  67.1 kg    Exam: General exam: In no acute distress. Respiratory system: Good air movement and clear to auscultation. Cardiovascular system: S1 & S2 heard, RRR. No JVD. Gastrointestinal system: Abdomen is nondistended, soft and nontender.  Central nervous system: Alert and oriented. No focal neurological deficits. Extremities: Mottled left lower extremity slightly cold  Data Reviewed:    Labs: Basic Metabolic Panel: Recent Labs  Lab 10/26/20 1004 10/26/20 1004 10/30/20 1652 10/31/20 0429 10/31/20 0955  NA 135  --  136  --  132*  K 3.9   < > 4.1  --  3.5  CL 98  --  102  --  103  CO2 11*  --  11*  --  11*  GLUCOSE 210*  --  205*  --  111*  BUN 79*  --  104*  --  113*  CREATININE 5.36*  --  4.62*  --  4.26*  CALCIUM 9.1  --  9.3  --  8.9  MG  --   --   --  2.4  --   PHOS  --   --   --  6.5*  --    < > = values in this interval not displayed.   GFR Estimated Creatinine Clearance: 17.3 mL/min (A) (by C-G formula based on SCr of 4.26 mg/dL (H)). Liver Function Tests: Recent Labs  Lab 10/31/20 0429  AST 235*  ALT 123*  ALKPHOS 81  BILITOT 0.9  PROT 7.2  ALBUMIN 2.5*   No results for input(s): LIPASE, AMYLASE in the last 168 hours. No results for input(s): AMMONIA in the last 168 hours. Coagulation profile Recent Labs  Lab 10/30/20 1822  INR 1.2   COVID-19 Labs  No results for input(s): DDIMER, FERRITIN, LDH, CRP in the last 72 hours.  Lab Results  Component Value Date   SARSCOV2NAA NEGATIVE 10/30/2020   [place] NEGATIVE 10/12/2020   [place] NEGATIVE 08/02/2020    CBC: Recent Labs  Lab 10/30/20 1652 10/31/20 0429  WBC 21.9* 19.5*  NEUTROABS  --  17.0*  HGB 12.2* 11.4*  HCT 37.3* 34.6*  MCV 85.4 83.8  PLT 379 296   Cardiac Enzymes: No results for input(s): CKTOTAL, CKMB, CKMBINDEX, TROPONINI in the last 168 hours. BNP (last 3 results) Recent Labs    09/25/20 0904 10/12/20 0812  PROBNP 2,404* 1,642*   CBG: Recent Labs  Lab  10/31/20 1647 10/31/20 2028 11/01/20 0058 11/01/20 0454 11/01/20 0807  GLUCAP 100* 166* 79 128* 111*   D-Dimer: No results for input(s): DDIMER in the last 72 hours. Hgb A1c: Recent Labs    10/31/20 0120  HGBA1C 7.1*   Lipid Profile: No results for input(s): CHOL, HDL, LDLCALC, TRIG, CHOLHDL, LDLDIRECT in the last 72 hours. Thyroid function studies: Recent Labs    10/31/20 0429  TSH 10.137*   Anemia work up: No results for input(s): VITAMINB12, FOLATE, FERRITIN, TIBC, IRON, RETICCTPCT in the last 72 hours. Sepsis Labs: Recent Labs  Lab 10/30/20 1652 10/30/20 2111 10/30/20 2141 10/31/20 0133 10/31/20 0429  PROCALCITON  --  1.19  --   --   --   WBC 21.9*  --   --   --  19.5*  LATICACIDVEN  --   --  1.5 1.3  --    Microbiology Recent Results (from the past 240 hour(s))  Respiratory Panel by RT PCR (Flu A&B, Covid) - Nasopharyngeal Swab     Status: None   Collection Time: 10/30/20  7:51 PM   Specimen: Nasopharyngeal Swab  Result Value Ref Range Status   SARS Coronavirus 2 by RT PCR NEGATIVE NEGATIVE Final    Comment: (NOTE) SARS-CoV-2 target nucleic acids are NOT DETECTED.  The SARS-CoV-2 RNA is generally detectable in upper respiratoy specimens during the acute phase of infection. The lowest concentration of SARS-CoV-2 viral copies this assay can detect is 131 copies/mL. A negative result does not preclude SARS-Cov-2 infection and should not be used as the sole basis for treatment or other patient management decisions. A negative result may occur with  improper specimen collection/handling, submission of specimen other than nasopharyngeal swab, presence of viral mutation(s) within the areas targeted by this assay, and inadequate number of viral copies (<131 copies/mL). A negative result must be combined with clinical observations, patient history, and epidemiological information. The expected result is Negative.  Fact Sheet for Patients:  PinkCheek.be  Fact Sheet for Healthcare Providers:  GravelBags.it  This test is no t yet approved or cleared by the [place] FDA and  has been authorized for detection and/or diagnosis of SARS-CoV-2 by FDA under an Emergency Use Authorization (EUA). This EUA will remain  in effect (meaning this test can be used) for the duration of the COVID-19 declaration under Section 564(b)(1) of the Act, 21 U.S.C. section 360bbb-3(b)(1), unless the authorization is terminated or revoked sooner.     Influenza A by PCR NEGATIVE NEGATIVE Final   Influenza B by PCR NEGATIVE NEGATIVE Final    Comment: (NOTE) The Xpert Xpress SARS-CoV-2/FLU/RSV assay is intended as an aid in  the diagnosis of influenza from Nasopharyngeal swab specimens and  should not be used as a sole basis for treatment. Nasal washings and  aspirates are unacceptable for Xpert Xpress SARS-CoV-2/FLU/RSV  testing.  Fact Sheet for Patients: PinkCheek.be  Fact Sheet for Healthcare Providers: GravelBags.it  This test is not yet approved or cleared by the [place] FDA and  has been authorized for detection and/or diagnosis of SARS-CoV-2 by  FDA under an Emergency Use Authorization (EUA). This EUA will remain  in effect (meaning this test can be used) for the duration of the  Covid-19 declaration under Section 564(b)(1) of the Act, 21  U.S.C. section 360bbb-3(b)(1), unless the authorization is  terminated or revoked. Performed at [facility], [place] [address], [place], [place] [address]   Culture, blood (x 2)     Status: None (Preliminary result)   Collection Time: 10/30/20  9:41 PM   Specimen: BLOOD  Result Value Ref Range Status   Specimen Description BLOOD RIGHT ANTECUBITAL  Final   Special Requests   Final    BOTTLES DRAWN AEROBIC AND ANAEROBIC Blood Culture adequate volume   Culture   Final     NO GROWTH 2 DAYS Performed at [facility], [place] [address] [place], [place] [address]    Report Status PENDING  Incomplete  Culture, blood (x 2)     Status: None (Preliminary result)   Collection Time: 10/31/20  1:20 AM   Specimen: BLOOD  Result Value Ref Range Status   Specimen Description BLOOD RIGHT ANTECUBITAL  Final   Special Requests   Final    BOTTLES DRAWN AEROBIC AND ANAEROBIC Blood Culture adequate volume   Culture   Final    NO GROWTH 1 DAY Performed at [facility], [place] [address], [place], [place] [address]    Report Status PENDING  Incomplete     Medications:   . aspirin EC  81 mg Oral Daily  . clopidogrel  300 mg Oral Once   Followed by  . [START ON 11/02/2020] clopidogrel  75 mg Oral Daily  . Gerhardt's butt cream   Topical BID  . heparin  5,000 Units Subcutaneous Q8H  . insulin aspart  0-6 Units Subcutaneous Q4H  . ivabradine  2.5 mg Oral BID WC  . nicotine  7 mg Transdermal Q24H   Continuous Infusions:    LOS: 2 days   Charlynne Cousins  [facility]  11/01/2020, 9:23 AM

## 2020-11-01 NOTE — NC FL2 (Signed)
[place] LEVEL OF CARE SCREENING TOOL     IDENTIFICATION  Patient Name: Mike [name] Birthdate: [birth year] Sex: male Admission Date (Current Location): 10/30/2020  [facility] and [place] Number:  Herbalist and Address:  [facility] [place], [place] [address] [place], [place] [address]      Provider Number: [id number]  Attending Physician Name and Address:  Charlynne Cousins, MD  Relative Name and Phone Number:  Sister [phone]    Current Level of Care: Hospital Recommended Level of Care: [place] Prior Approval Number:    Date Approved/Denied:   PASRR Number: [id number]  Discharge Plan: SNF    Current Diagnoses: Patient Active Problem List   Diagnosis Date Noted  . Chest pain 10/31/2020  . Acute on chronic HFrEF (heart failure with reduced ejection fraction) ([place])   . Goals of care, counseling/discussion   . Palliative care by specialist   . DNR (do not resuscitate)   . Critical limb ischemia with history of revascularization of same extremity ([place]) 10/30/2020  . Hyperglycemia 10/30/2020  . Tobacco abuse 10/30/2020  . Leukocytosis 10/30/2020  . Chronic combined systolic and diastolic CHF (congestive heart failure) ([place]) 10/30/2020  . Ischemic cardiomyopathy 10/24/2020  . AKI (acute kidney injury) ([place]) 10/17/2020  . PAD (peripheral artery disease) ([place]) 10/16/2020  . Critical lower limb ischemia ([place]) 10/05/2020  . Rest pain of left upper extremity due to atherosclerosis ([place]) 10/05/2020  . Severe claudication ([place]) 09/20/2020  . Paresthesia of both lower extremities 09/20/2020  . H/O cardiac arrest 09/20/2020  . H/O acute myocardial infarction 08/14/2020  . Snoring 08/14/2020  . Sinus pause 08/14/2020  . Mixed hyperlipidemia 08/14/2020  . Tobacco dependence 08/14/2020  . Coronary artery disease involving native coronary artery of native heart without angina pectoris 08/10/2020  . HFrEF (heart  failure with reduced ejection fraction) ([place]) 08/10/2020  . Moderate mitral regurgitation 08/10/2020  . STEMI (ST elevation myocardial infarction) ([place]) 08/02/2020  . Accelerated hypertension 08/01/2020    Orientation RESPIRATION BLADDER Height & Weight     Self, Time, Situation, Place  O2 (Nasal Cannula 2 liters) Continent Weight: 147 lb 14.9 oz (67.1 kg) Height:  6' (182.9 cm)  BEHAVIORAL SYMPTOMS/MOOD NEUROLOGICAL BOWEL NUTRITION STATUS      Continent Diet (See Discharge Summary)  AMBULATORY STATUS COMMUNICATION OF NEEDS Skin   Limited Assist Verbally Surgical wounds (Wound/Incision (open or dehisced)(MASD)Sacrum Left,Wound/Incision (open or dehisced) other, toe anterior left)                       Personal Care Assistance Level of Assistance  Bathing, Feeding, Dressing Bathing Assistance: Limited assistance Feeding assistance: Independent (able to feed self;Cardiac) Dressing Assistance: Limited assistance     Functional Limitations Info  Sight, Hearing, Speech Sight Info: Adequate Hearing Info: Adequate Speech Info: Adequate    SPECIAL CARE FACTORS FREQUENCY  PT (By licensed PT), OT (By licensed OT)     PT Frequency: 5x min weekly OT Frequency: 5x min weekly            Contractures Contractures Info: Not present    Additional Factors Info  Code Status, Insulin Sliding Scale Code Status Info: DNR     Insulin Sliding Scale Info: insulin aspart (novoLOG) injection 0-6 Units every 4 hours,       Current Medications (11/01/2020):  This is the current hospital active medication list Current Facility-Administered Medications  Medication Dose Route Frequency Provider Last Rate Last  Admin  . acetaminophen (TYLENOL) tablet 650 mg  650 mg Oral Q6H PRN Doutova, Anastassia, MD       Or  . acetaminophen (TYLENOL) suppository 650 mg  650 mg Rectal Q6H PRN Doutova, Anastassia, MD      . aspirin EC tablet 81 mg  81 mg Oral Daily Doutova, Anastassia, MD   81 mg at  11/01/20 0937  . [START ON 11/02/2020] clopidogrel (PLAVIX) tablet 75 mg  75 mg Oral Daily Patwardhan, Manish J, MD      . diphenhydrAMINE (BENADRYL) injection 12.5 mg  12.5 mg Intravenous Q6H PRN Charlynne Cousins, MD       Or  . diphenhydrAMINE (BENADRYL) 12.5 MG/5ML elixir 12.5 mg  12.5 mg Oral Q6H PRN Charlynne Cousins, MD      . feeding supplement (ENSURE ENLIVE / ENSURE PLUS) liquid 237 mL  237 mL Oral BID BM Charlynne Cousins, MD      . fentaNYL 50 mcg/mL PCA injection   Intravenous Q4H Charlynne Cousins, MD   Set-up / Initial Syringe at 11/01/20 1329  . Gerhardt's butt cream   Topical BID Charlynne Cousins, MD   Given at 11/01/20 0940  . heparin injection 5,000 Units  5,000 Units Subcutaneous Q8H Toy Baker, MD   5,000 Units at 11/01/20 0449  . insulin aspart (novoLOG) injection 0-6 Units  0-6 Units Subcutaneous Q4H Toy Baker, MD   1 Units at 10/31/20 2102  . ivabradine (CORLANOR) tablet 2.5 mg  2.5 mg Oral BID WC Doutova, Anastassia, MD      . multivitamin with minerals tablet 1 tablet  1 tablet Oral Daily Charlynne Cousins, MD      . naloxone [facility]) injection 0.4 mg  0.4 mg Intravenous PRN Charlynne Cousins, MD       And  . sodium chloride flush (NS) 0.9 % injection 9 mL  9 mL Intravenous PRN Charlynne Cousins, MD      . nicotine (NICODERM CQ - dosed in mg/24 hr) patch 7 mg  7 mg Transdermal Q24H Doutova, Anastassia, MD   7 mg at 10/31/20 1032  . nitroGLYCERIN (NITROSTAT) SL tablet 0.4 mg  0.4 mg Sublingual Q5 min PRN Toy Baker, MD      . ondansetron (ZOFRAN) injection 4 mg  4 mg Intravenous Q6H PRN Charlynne Cousins, MD      . oxyCODONE-acetaminophen (PERCOCET/ROXICET) 5-325 MG per tablet 1 tablet  1 tablet Oral Q8H PRN Toy Baker, MD   1 tablet at 11/01/20 1023     Discharge Medications: Please see discharge summary for a list of discharge medications.  Relevant Imaging Results:  Relevant Lab  Results:   Additional Information [id number]  Rocklyn Mayberry B Norwin Aleman, LCSWA

## 2020-11-01 NOTE — Progress Notes (Signed)
Daily Progress Note   Patient Name: Mike [name]       Date: 11/01/2020 DOB: 01/25/1961  Age: 59 y.o. MRN#: [id number] Attending Physician: Charlynne Cousins, MD Primary Care Physician: Patient, No Pcp Per Admit Date: 10/30/2020  Reason for Consultation/Follow-up: Establishing goals of care  Subjective: "can't get comfortable", difficulty getting situated in bed  Length of Stay: 2  Current Medications: Scheduled Meds:  . aspirin EC  81 mg Oral Daily  . [START ON 11/02/2020] clopidogrel  75 mg Oral Daily  . fentaNYL   Intravenous Q4H  . Gerhardt's butt cream   Topical BID  . heparin  5,000 Units Subcutaneous Q8H  . insulin aspart  0-6 Units Subcutaneous Q4H  . ivabradine  2.5 mg Oral BID WC  . nicotine  7 mg Transdermal Q24H    Continuous Infusions:   PRN Meds: acetaminophen **OR** acetaminophen, diphenhydrAMINE **OR** diphenhydrAMINE, naloxone **AND** sodium chloride flush, nitroGLYCERIN, ondansetron (ZOFRAN) IV, oxyCODONE-acetaminophen  Physical Exam Constitutional:      General: He is not in acute distress. Pulmonary:     Comments: Mild tachypnea Skin:    General: Skin is warm and dry.  Neurological:     Mental Status: He is alert and oriented to person, place, and time.             Vital Signs: BP 98/60 (BP Location: Left Arm)   Pulse 69   Temp (!) 97.5 F (36.4 C) (Oral)   Resp 18   Ht 6' (1.829 m)   Wt 67.1 kg   SpO2 100%   BMI 20.06 kg/m  SpO2: SpO2: 100 % O2 Device: O2 Device: Nasal Cannula O2 Flow Rate: O2 Flow Rate (L/min): 2 L/min  Intake/output summary:   Intake/Output Summary (Last 24 hours) at 11/01/2020 0955 Last data filed at 11/01/2020 0816 Gross per 24 hour  Intake 838 ml  Output 1625 ml  Net -787 ml   LBM: Last BM Date: 10/31/20 Baseline  Weight: Weight: 67.4 kg Most recent weight: Weight: 67.1 kg       Palliative Assessment/Data: PPS 50%    Flowsheet Rows     Most Recent Value  Intake Tab  Referral Department Hospitalist  Unit at Time of Referral Cardiac/Telemetry Unit  Palliative Care Primary Diagnosis Cardiac  Date Notified 10/30/20  Palliative Care Type New Palliative care  Reason for referral Clarify Goals of Care  Date of Admission 10/30/20  Date first seen by Palliative Care 10/31/20  # of days Palliative referral response time 1 Day(s)  # of days IP prior to Palliative referral 0  Clinical Assessment  Palliative Performance Scale Score 50%  Psychosocial & Spiritual Assessment  Palliative Care Outcomes  Patient/Family meeting held? Yes  Who was at the meeting? patient  Palliative Care Outcomes Improved pain interventions, Clarified goals of care, Provided end of life care assistance, Provided psychosocial or spiritual support, Changed CPR status      Patient Active Problem List   Diagnosis Date Noted  . Chest pain 10/31/2020  . Acute on chronic HFrEF (heart failure with reduced ejection fraction) ([place])   . Goals of care, counseling/discussion   . Palliative care by specialist   . DNR (do  not resuscitate)   . Critical limb ischemia with history of revascularization of same extremity ([place]) 10/30/2020  . Hyperglycemia 10/30/2020  . Tobacco abuse 10/30/2020  . Leukocytosis 10/30/2020  . Chronic combined systolic and diastolic CHF (congestive heart failure) ([place]) 10/30/2020  . Ischemic cardiomyopathy 10/24/2020  . AKI (acute kidney injury) ([place]) 10/17/2020  . PAD (peripheral artery disease) ([place]) 10/16/2020  . Critical lower limb ischemia ([place]) 10/05/2020  . Rest pain of left upper extremity due to atherosclerosis ([place]) 10/05/2020  . Severe claudication ([place]) 09/20/2020  . Paresthesia of both lower extremities 09/20/2020  . H/O cardiac arrest 09/20/2020  . H/O acute myocardial infarction 08/14/2020   . Snoring 08/14/2020  . Sinus pause 08/14/2020  . Mixed hyperlipidemia 08/14/2020  . Tobacco dependence 08/14/2020  . Coronary artery disease involving native coronary artery of native heart without angina pectoris 08/10/2020  . HFrEF (heart failure with reduced ejection fraction) ([place]) 08/10/2020  . Moderate mitral regurgitation 08/10/2020  . STEMI (ST elevation myocardial infarction) ([place]) 08/02/2020  . Accelerated hypertension 08/01/2020    Palliative Care Assessment & Plan   HPI: 59 y.o. male  with past medical history of CAD, STEMI Aug 2021, CHF, Cardiac arrest, PAD, severe bilateral renal artery stenosis, and CKD admitted on 10/30/2020 with critical limb ischemia.  Patient is very high risk for surgery d/t CKD and systolic heart failure. PMT consulted to discuss [place].  Assessment: F/u with Mike [name] - pain uncontrolled. PCA has been ordered but not setup yet. Requested nurse bring PRN medication.   Mike [name] remains interested in amputation. We briefly discussed post op course and need for rehab stay. He expresses understanding. He tells me he just "wants to get it over with".   Questions and concerns were addressed. PMT will follow.   Recommendations/Plan: DNR Patient interested in pursuing amputation Comfort measures introduced to patient but he would first like to explore surgical options further PCA ordered by primary - discussed pain management with RN  Code Status: DNR  Prognosis:  < 6 months but high risk for acute decompensation  Discharge Planning: To Be Determined  Care plan was discussed with patient and RN  Thank you for allowing the Palliative Medicine Team to assist in the care of this patient.   Total Time 15 minutes Prolonged Time Billed  no       Greater than 50%  of this time was spent counseling and coordinating care related to the above assessment and plan.  Mike Burrow, DNP, [facility] Palliative Medicine Team Team Phone #  [phone]  Pager [phone] 2

## 2020-11-02 DIAGNOSIS — Z9889 Other specified postprocedural states: Secondary | ICD-10-CM | POA: Diagnosis not present

## 2020-11-02 DIAGNOSIS — I70229 Atherosclerosis of native arteries of extremities with rest pain, unspecified extremity: Secondary | ICD-10-CM | POA: Diagnosis not present

## 2020-11-02 LAB — CBC WITH DIFFERENTIAL/PLATELET
Abs Immature Granulocytes: 0.11 10*3/uL — ABNORMAL HIGH (ref 0.00–0.07)
Basophils Absolute: 0 10*3/uL (ref 0.0–0.1)
Basophils Relative: 0 %
Eosinophils Absolute: 0.3 10*3/uL (ref 0.0–0.5)
Eosinophils Relative: 2 %
HCT: 31.1 % — ABNORMAL LOW (ref 39.0–52.0)
Hemoglobin: 10.4 g/dL — ABNORMAL LOW (ref 13.0–17.0)
Immature Granulocytes: 1 %
Lymphocytes Relative: 5 %
Lymphs Abs: 0.8 10*3/uL (ref 0.7–4.0)
MCH: 27.9 pg (ref 26.0–34.0)
MCHC: 33.4 g/dL (ref 30.0–36.0)
MCV: 83.4 fL (ref 80.0–100.0)
Monocytes Absolute: 1 10*3/uL (ref 0.1–1.0)
Monocytes Relative: 6 %
Neutro Abs: 14.1 10*3/uL — ABNORMAL HIGH (ref 1.7–7.7)
Neutrophils Relative %: 86 %
Platelets: 242 10*3/uL (ref 150–400)
RBC: 3.73 MIL/uL — ABNORMAL LOW (ref 4.22–5.81)
RDW: 16.4 % — ABNORMAL HIGH (ref 11.5–15.5)
WBC: 16.3 10*3/uL — ABNORMAL HIGH (ref 4.0–10.5)
nRBC: 0 % (ref 0.0–0.2)

## 2020-11-02 LAB — BASIC METABOLIC PANEL
Anion gap: 15 (ref 5–15)
BUN: 110 mg/dL — ABNORMAL HIGH (ref 8–23)
CO2: 15 mmol/L — ABNORMAL LOW (ref 22–32)
Calcium: 8.8 mg/dL — ABNORMAL LOW (ref 8.9–10.3)
Chloride: 103 mmol/L (ref 98–111)
Creatinine, Ser: 3.45 mg/dL — ABNORMAL HIGH (ref 0.61–1.24)
GFR, Estimated: 19 mL/min — ABNORMAL LOW (ref >60)
Glucose, Bld: 96 mg/dL (ref 70–99)
Potassium: 3.1 mmol/L — ABNORMAL LOW (ref 3.5–5.1)
Sodium: 133 mmol/L — ABNORMAL LOW (ref 135–145)

## 2020-11-02 LAB — GLUCOSE, CAPILLARY
Glucose-Capillary: 109 mg/dL — ABNORMAL HIGH (ref 70–99)
Glucose-Capillary: 129 mg/dL — ABNORMAL HIGH (ref 70–99)
Glucose-Capillary: 107 mg/dL — ABNORMAL HIGH (ref 70–99)
Glucose-Capillary: 130 mg/dL — ABNORMAL HIGH (ref 70–99)
Glucose-Capillary: 167 mg/dL — ABNORMAL HIGH (ref 70–99)
Glucose-Capillary: 100 mg/dL — ABNORMAL HIGH (ref 70–99)

## 2020-11-02 MED ORDER — SODIUM CHLORIDE 0.9 % IV SOLN
INTRAVENOUS | Status: DC | PRN
  Administered 2020-11-02 – 2020-11-12 (×2): 250 mL via INTRAVENOUS

## 2020-11-02 MED ORDER — POTASSIUM CHLORIDE CRYS ER 20 MEQ PO TBCR
40.0000 meq | EXTENDED_RELEASE_TABLET | Freq: Two times a day (BID) | ORAL | Status: AC
  Administered 2020-11-02 (×2): 40 meq via ORAL
  Filled 2020-11-02 (×2): qty 2

## 2020-11-02 NOTE — Progress Notes (Addendum)
[facility] PROGRESS NOTE    Progress Note  Mike [name]  IOE:[id number] DOB: 1961-01-21 DOA: 10/30/2020 PCP: Patient, No Pcp Per     Brief Narrative:   Mike [name] is an 59 y.o. male past medical history significant for STEMI in 08/17/2020 with moderate MR cardiac arrest secondary to torsade post PCI, he has no known resolve ST segment elevation in the inferior leads since his STEMI in 08/17/2020, critical limb ischemia with right iliac revascularization with residual limb ischemia in the left lower extremity, severe bilateral renal artery stenosis, acute kidney injury following arteriogram on 10/16/2020 is into the hospital complaining of shortness of breath and inability to walk due to severe leg pain.  Cardiology was consulted and unfortunately he has an extremely high risk of limb loss and in view of his underlying chronic kidney disease stage IV chronic systolic heart failure that is due to ischemic cardiomyopathy he has an extremely high risk of mortality.  Urology recommended to consult  Care to move towards comfort care.   Assessment/Plan:   Critical limb ischemia with pain at rest on the left lower extremity with no revascularization at this point, vascular surgery consult was obtained on 10/17/2020: Vascular surgery was consulted and they recommended above-the-knee amputation, he was changed to aspirin and Plavix.  Surgery will probably be sometime early next week. Please on a PCA pump pain is fairly controlled. We had a long conversation really understand this he is very high risk of complications renal failure and cardiopulmonary complications knowing this risk he would like to proceed with surgery.  Ongoing tobacco abuse: The admitting physician spoke to him and at this point in time he is interested in quitting and nicotine patch was ordered.  Hyperlipidemia: Continue statins.  Coronary artery disease: Continue statins, beta-blockers, aspirin and  Plavix. Patient is chronic recurrent chest pain troponins are stable EKG changes are chronic.  Chronic kidney disease stage IV: Creatinine appears to be at baseline.  Hyperglycemia/diabetes mellitus type 2: No prior history of diabetes, globin A1c of 7.1, will continue sliding scale insulin.  History of ischemic cardiomyopathy: Appears to be euvolemic blood pressure is soft holding Lasix.  Leukocytosis: He has remained afebrile will monitor for any signs of infection, his cardiologist was consulted recommended moving towards comfort care.  Severe protein caloric malnutrition:  DVT prophylaxis: lovenxo Family Communication:none Status is: Inpatient  Remains inpatient appropriate because:Hemodynamically unstable   Dispo: The patient is from: Home              Anticipated d/c is to: Home              Anticipated d/c date is: 2 days              Patient currently is not medically stable to d/c.        Code Status:     Code Status Orders  (From admission, onward)           Start     Ordered   10/31/20 0235  Limited resuscitation (code)  Continuous       Question Answer Comment  In the event of cardiac or respiratory ARREST: Initiate Code Blue, Call Rapid Response Yes   In the event of cardiac or respiratory ARREST: Perform CPR Yes   In the event of cardiac or respiratory ARREST: Perform Intubation/Mechanical Ventilation No   In the event of cardiac or respiratory ARREST: Use NIPPV/BiPAp only if indicated Yes   In the event of cardiac  or respiratory ARREST: Administer ACLS medications if indicated Yes   In the event of cardiac or respiratory ARREST: Perform Defibrillation or Cardioversion if indicated Yes      10/31/20 0234           Code Status History     Date Active Date Inactive Code Status Order ID Comments User Context   10/16/2020 1912 10/18/2020 2008 Full Code [id number]  Nigel Mormon, MD Inpatient   08/02/2020 0035 08/04/2020 1745 Full Code  [id number]  Nigel Mormon, MD Inpatient   Advance Care Planning Activity      Advance Directive Documentation      Most Recent Value  Type of Advance Directive Living will, Healthcare Power of Attorney  Pre-existing out of facility DNR order (yellow form or pink MOST form) --  "MOST" Form in Place? --         IV Access:   Peripheral IV   Procedures and diagnostic studies:   No results found.   Medical Consultants:   None.  Anti-Infectives:   none  Subjective:    Mike [name] relates his pain is a little bit better controlled.  Objective:    Vitals:   11/02/20 0111 11/02/20 0433 11/02/20 0812 11/02/20 0814  BP: (!) 100/53 103/61  (!) 95/56  Pulse: 66 65  75  Resp: 16 18 18 18   Temp: 98 F (36.7 C) (!) 97.4 F (36.3 C)    TempSrc: Oral Oral    SpO2: 98% 100% 96% 97%  Weight:  66.3 kg    Height:       SpO2: 97 % O2 Flow Rate (L/min): 2 L/min   Intake/Output Summary (Last 24 hours) at 11/02/2020 0906 Last data filed at 11/02/2020 0330 Gross per 24 hour  Intake 0.6 ml  Output 825 ml  Net -824.4 ml   Filed Weights   10/31/20 0410 11/01/20 0042 11/02/20 0433  Weight: 67.4 kg 67.1 kg 66.3 kg    Exam: General exam: In no acute distress. Respiratory system: Good air movement and clear to auscultation. Cardiovascular system: S1 & S2 heard, RRR. No JVD. Gastrointestinal system: Abdomen is nondistended, soft and nontender.  Extremities: No pedal edema. Skin: Left lower extremity is slightly cold  Data Reviewed:    Labs: Basic Metabolic Panel: Recent Labs  Lab 10/26/20 1004 10/26/20 1004 10/30/20 1652 10/30/20 1652 10/31/20 0429 10/31/20 0955 10/31/20 0955 11/01/20 0928 11/02/20 0151  NA 135  --  136  --   --  132*  --  134* 133*  K 3.9   < > 4.1   < >  --  3.5   < > 3.6 3.1*  CL 98  --  102  --   --  103  --  103 103  CO2 11*  --  11*  --   --  11*  --  13* 15*  GLUCOSE 210*  --  205*  --   --  111*  --  119* 96  BUN 79*   --  104*  --   --  113*  --  111* 110*  CREATININE 5.36*  --  4.62*  --   --  4.26*  --  3.75* 3.45*  CALCIUM 9.1  --  9.3  --   --  8.9  --  8.9 8.8*  MG  --   --   --   --  2.4  --   --   --   --  PHOS  --   --   --   --  6.5*  --   --  6.2*  --    < > = values in this interval not displayed.   GFR Estimated Creatinine Clearance: 21.1 mL/min (A) (by C-G formula based on SCr of 3.45 mg/dL (H)). Liver Function Tests: Recent Labs  Lab 10/31/20 0429  AST 235*  ALT 123*  ALKPHOS 81  BILITOT 0.9  PROT 7.2  ALBUMIN 2.5*   No results for input(s): LIPASE, AMYLASE in the last 168 hours. No results for input(s): AMMONIA in the last 168 hours. Coagulation profile Recent Labs  Lab 10/30/20 1822  INR 1.2   COVID-19 Labs  No results for input(s): DDIMER, FERRITIN, LDH, CRP in the last 72 hours.  Lab Results  Component Value Date   SARSCOV2NAA NEGATIVE 10/30/2020   [place] NEGATIVE 10/12/2020   [place] NEGATIVE 08/02/2020    CBC: Recent Labs  Lab 10/30/20 1652 10/31/20 0429 11/01/20 0928 11/02/20 0151  WBC 21.9* 19.5* 18.2* 16.3*  NEUTROABS  --  17.0* 16.1* 14.1*  HGB 12.2* 11.4* 11.4* 10.4*  HCT 37.3* 34.6* 34.0* 31.1*  MCV 85.4 83.8 85.2 83.4  PLT 379 296 288 242   Cardiac Enzymes: No results for input(s): CKTOTAL, CKMB, CKMBINDEX, TROPONINI in the last 168 hours. BNP (last 3 results) Recent Labs    09/25/20 0904 10/12/20 0812  PROBNP 2,404* 1,642*   CBG: Recent Labs  Lab 11/01/20 1640 11/01/20 2040 11/02/20 0111 11/02/20 0439 11/02/20 0734  GLUCAP 102* 181* 109* 129* 107*   D-Dimer: No results for input(s): DDIMER in the last 72 hours. Hgb A1c: Recent Labs    10/31/20 0120  HGBA1C 7.1*   Lipid Profile: No results for input(s): CHOL, HDL, LDLCALC, TRIG, CHOLHDL, LDLDIRECT in the last 72 hours. Thyroid function studies: Recent Labs    10/31/20 0429  TSH 10.137*   Anemia work up: No results for input(s): VITAMINB12, FOLATE,  FERRITIN, TIBC, IRON, RETICCTPCT in the last 72 hours. Sepsis Labs: Recent Labs  Lab 10/30/20 1652 10/30/20 2111 10/30/20 2141 10/31/20 0133 10/31/20 0429 11/01/20 0928 11/02/20 0151  PROCALCITON  --  1.19  --   --   --   --   --   WBC 21.9*  --   --   --  19.5* 18.2* 16.3*  LATICACIDVEN  --   --  1.5 1.3  --   --   --    Microbiology Recent Results (from the past 240 hour(s))  Respiratory Panel by RT PCR (Flu A&B, Covid) - Nasopharyngeal Swab     Status: None   Collection Time: 10/30/20  7:51 PM   Specimen: Nasopharyngeal Swab  Result Value Ref Range Status   SARS Coronavirus 2 by RT PCR NEGATIVE NEGATIVE Final    Comment: (NOTE) SARS-CoV-2 target nucleic acids are NOT DETECTED.  The SARS-CoV-2 RNA is generally detectable in upper respiratoy specimens during the acute phase of infection. The lowest concentration of SARS-CoV-2 viral copies this assay can detect is 131 copies/mL. A negative result does not preclude SARS-Cov-2 infection and should not be used as the sole basis for treatment or other patient management decisions. A negative result may occur with  improper specimen collection/handling, submission of specimen other than nasopharyngeal swab, presence of viral mutation(s) within the areas targeted by this assay, and inadequate number of viral copies (<131 copies/mL). A negative result must be combined with clinical observations, patient history, and epidemiological information. The expected result is Negative.  Fact Sheet for Patients:  PinkCheek.be  Fact Sheet for Healthcare Providers:  GravelBags.it  This test is no t yet approved or cleared by the [place] FDA and  has been authorized for detection and/or diagnosis of SARS-CoV-2 by FDA under an Emergency Use Authorization (EUA). This EUA will remain  in effect (meaning this test can be used) for the duration of the COVID-19 declaration under  Section 564(b)(1) of the Act, 21 U.S.C. section 360bbb-3(b)(1), unless the authorization is terminated or revoked sooner.     Influenza A by PCR NEGATIVE NEGATIVE Final   Influenza B by PCR NEGATIVE NEGATIVE Final    Comment: (NOTE) The Xpert Xpress SARS-CoV-2/FLU/RSV assay is intended as an aid in  the diagnosis of influenza from Nasopharyngeal swab specimens and  should not be used as a sole basis for treatment. Nasal washings and  aspirates are unacceptable for Xpert Xpress SARS-CoV-2/FLU/RSV  testing.  Fact Sheet for Patients: PinkCheek.be  Fact Sheet for Healthcare Providers: GravelBags.it  This test is not yet approved or cleared by the [place] FDA and  has been authorized for detection and/or diagnosis of SARS-CoV-2 by  FDA under an Emergency Use Authorization (EUA). This EUA will remain  in effect (meaning this test can be used) for the duration of the  Covid-19 declaration under Section 564(b)(1) of the Act, 21  U.S.C. section 360bbb-3(b)(1), unless the authorization is  terminated or revoked. Performed at [facility], [place] [address] [place], [place] [address]   Culture, blood (x 2)     Status: None (Preliminary result)   Collection Time: 10/30/20  9:41 PM   Specimen: BLOOD  Result Value Ref Range Status   Specimen Description BLOOD RIGHT ANTECUBITAL  Final   Special Requests   Final    BOTTLES DRAWN AEROBIC AND ANAEROBIC Blood Culture adequate volume   Culture   Final    NO GROWTH 3 DAYS Performed at [facility], [place] [address] [place], [place] [address]    Report Status PENDING  Incomplete  Culture, blood (x 2)     Status: None (Preliminary result)   Collection Time: 10/31/20  1:20 AM   Specimen: BLOOD  Result Value Ref Range Status   Specimen Description BLOOD RIGHT ANTECUBITAL  Final   Special Requests   Final    BOTTLES DRAWN AEROBIC AND ANAEROBIC Blood Culture adequate volume    Culture   Final    NO GROWTH 2 DAYS Performed at [facility], [place] [address] [place],  [address]    Report Status PENDING  Incomplete     Medications:    aspirin EC  81 mg Oral Daily   clopidogrel  75 mg Oral Daily   feeding supplement  237 mL Oral BID BM   fentaNYL   Intravenous Q4H   Gerhardt's butt cream   Topical BID   heparin  5,000 Units Subcutaneous Q8H   insulin aspart  0-6 Units Subcutaneous Q4H   ivabradine  2.5 mg Oral BID WC   multivitamin with minerals  1 tablet Oral Daily   nicotine  7 mg Transdermal Q24H   potassium chloride  40 mEq Oral BID WC   Continuous Infusions:  sodium chloride 250 mL (11/02/20 0330)      LOS: 3 days   Charlynne Cousins  [facility]  11/02/2020, 9:06 AM

## 2020-11-02 NOTE — Consult Note (Addendum)
Nephrology Consult   Requesting provider: Aileen Fass, Tammi Klippel Service requesting consult: Hospitalist Reason for consult: Elevated Creatinine   Assessment/Recommendations: Mike [name] is a/an 59 y.o. male with a past medical history CAD s/p STEMI, PAD w/ h/po critical limb ischemia, hld, renal artery stenosis, systolic heart failure related to ischemic cardiomyopathy, elevated Creatinine who present w/ critical limb ischemia  Elevated Creatinine likely CKD but cannot rule out AKI component.:  Significant change in creatinine since August when he had a STEMI.  Creatinine was 0.5 at that time and rose to 1.1 prior to discharge likely demonstrating about a 40% loss in GFR. Now with significant rise in Crt last month and no definitively baseline.  It is difficult to say how much of his creatinine elevation is related to CKD and how much is AKI given he has not had stable renal function over an extended period of time.  -Was taking ibuprofen prior to admission, some acute component of AKI may be present that will resolve with time -Could consider the use of Midodrin to improve MAP but the increased afterload could exacerbate his heart failure -Perioperative assessment as below -Continue to monitor daily Cr, Dose meds for GFR -Monitor Daily I/Os, Daily weight  -Avoid nephrotoxic medications including NSAIDs and Vanc/Zosyn combo -Currently no indication for HD -Agree with palliative care involvement  Perioperative renal assessment: Patient is at high risk for kidney injury given his likely baseline CKD and history of recurrent AKI's.  His renal artery stenosis in the setting of hypotension also makes him very high risk.   -This risk can be limited by allowing the creatinine to improve to baseline prior to intervention.  We do not know his baseline but allowing the creatinine to improve to a plateau may be beneficial.  If acute need for surgery or intervention develops the risk associated with  that may outweigh the benefits related to the kidneys -Patient is at high risk for contrast induced kidney injury.  His renal insufficiency is severe to the point where he may develop a need for dialysis.  Patient is interested in dialysis but palliative care should be involved. -Patient is also at high risk for acute tubular necrosis secondary to hypotension associated with more significant operative repair such as amputation. -There is no way to tell but I think there is at least a 50% chance that the patient may require dialysis with any intervention if his GFR remains at what it is now given his comorbidities -If amputation is pursued significant efforts should be made to prevent hypotension as much as possible -If intervention all repair is pursued any contrast is used would provide.  Contrast hydration with 100 cc/h x 10 hours preoperatively while monitoring for hypoxia closely  Critical Limb Ischemia: cardiology and proceduralist involved. Will follow plans for intervention and tailor recommendations based on decisions.  Volume Status: Appears relatively euvolemic at this time.  Would encourage to drink to thirst.  Given heart failure monitor closely for volume overload  Hypokalemia: continue oral replacement PRN   Recommendations conveyed to primary service.    [facility] 11/02/2020 11:07 AM   _____________________________________________________________________________________ CC: CKD  History of Present Illness: Mike [name] is a/an 59 y.o. male with a past medical history of CAD s/p STEMI, PAD w/ h/po critical limb ischemia, hld, renal artery stenosis, systolic heart failure related to ischemic cardiomyopathy, elevated Creatinine who presented with critical limb ischemia and leg pain.  Patient initially presented on 11/2 endorsing leg  pain.  This has been an ongoing issue and he had been hospitalized for concern of critical limb ischemia in  the past.  Vascular surgery recommended that he be admitted for further evaluation likely requiring manipulation.  The patient states he has been taking regular ibuprofen at home, about 2 tablets nightly.  He was evaluated during a previous hospitalization with AKI with a creatinine up to 5 that was thought to be related to decreased perfusion.  He also received contrast during that admission so contrast-induced injury was considered.  Patient states that he is short of breath today and having leg pain.  He denies nausea, vomiting, diarrhea, confusion.  Patient had a lot of medical issues starting back in August when he presented with a STEMI.  He had cardiac arrest after reperfusion.  At the time of his presentation his creatinine was 0.5 in August but increased to about 1.  Ever since that time his creatinine is rapidly increased further.  Is been thought this may be related to some contrast that he was given.  His creatinine is fluctuated but now seems to be stable around 3.5.    Medications:  Current Facility-Administered Medications  Medication Dose Route Frequency Provider Last Rate Last Admin  . 0.9 %  sodium chloride infusion   Intravenous PRN Charlynne Cousins, MD 10 mL/hr at 11/02/20 0330 250 mL at 11/02/20 0330  . acetaminophen (TYLENOL) tablet 650 mg  650 mg Oral Q6H PRN Toy Baker, MD       Or  . acetaminophen (TYLENOL) suppository 650 mg  650 mg Rectal Q6H PRN Doutova, Anastassia, MD      . aspirin EC tablet 81 mg  81 mg Oral Daily Doutova, Anastassia, MD   81 mg at 11/02/20 0816  . clopidogrel (PLAVIX) tablet 75 mg  75 mg Oral Daily Patwardhan, Manish J, MD   75 mg at 11/02/20 0816  . diphenhydrAMINE (BENADRYL) injection 12.5 mg  12.5 mg Intravenous Q6H PRN Charlynne Cousins, MD       Or  . diphenhydrAMINE (BENADRYL) 12.5 MG/5ML elixir 12.5 mg  12.5 mg Oral Q6H PRN Charlynne Cousins, MD      . feeding supplement (ENSURE ENLIVE / ENSURE PLUS) liquid 237 mL  237 mL  Oral BID BM Charlynne Cousins, MD   237 mL at 11/02/20 0816  . fentaNYL 50 mcg/mL PCA injection   Intravenous Q4H Charlynne Cousins, MD   15 mcg at 11/02/20 0448  . Gerhardt's butt cream   Topical BID Charlynne Cousins, MD   Given at 11/02/20 [phone]  . heparin injection 5,000 Units  5,000 Units Subcutaneous Q8H Toy Baker, MD   5,000 Units at 11/02/20 0434  . insulin aspart (novoLOG) injection 0-6 Units  0-6 Units Subcutaneous Q4H Toy Baker, MD   1 Units at 11/01/20 2102  . ivabradine (CORLANOR) tablet 2.5 mg  2.5 mg Oral BID WC Doutova, Anastassia, MD      . multivitamin with minerals tablet 1 tablet  1 tablet Oral Daily Charlynne Cousins, MD   1 tablet at 11/02/20 0816  . naloxone [facility]) injection 0.4 mg  0.4 mg Intravenous PRN Charlynne Cousins, MD       And  . sodium chloride flush (NS) 0.9 % injection 9 mL  9 mL Intravenous PRN Charlynne Cousins, MD      . nicotine (NICODERM CQ - dosed in mg/24 hr) patch 7 mg  7 mg Transdermal Q24H Toy Baker, MD  7 mg at 10/31/20 1032  . nitroGLYCERIN (NITROSTAT) SL tablet 0.4 mg  0.4 mg Sublingual Q5 min PRN Toy Baker, MD      . ondansetron (ZOFRAN) injection 4 mg  4 mg Intravenous Q6H PRN Charlynne Cousins, MD      . oxyCODONE-acetaminophen (PERCOCET/ROXICET) 5-325 MG per tablet 1 tablet  1 tablet Oral Q8H PRN Toy Baker, MD   1 tablet at 11/02/20 0122  . potassium chloride SA (KLOR-CON) CR tablet 40 mEq  40 mEq Oral BID WC Charlynne Cousins, MD   40 mEq at 11/02/20 0654     ALLERGIES Patient has no known allergies.  MEDICAL HISTORY Past Medical History:  Diagnosis Date  . CAD (coronary artery disease)   . Heart attack ([place])   . Hyperlipidemia   . Hypertension   . PAD (peripheral artery disease) ([place])      SOCIAL HISTORY Social History   Socioeconomic History  . Marital status: Single    Spouse name: Not on file  . Number of children: 1  . Years of education: Not on  file  . Highest education level: Not on file  Occupational History  . Not on file  Tobacco Use  . Smoking status: Current Some Day Smoker    Packs/day: 0.50    Years: 40.00    Pack years: 20.00    Types: Cigarettes  . Smokeless tobacco: Never Used  Vaping Use  . Vaping Use: Never used  Substance and Sexual Activity  . Alcohol use: Yes    Alcohol/week: 10.0 standard drinks    Types: 10 Cans of beer per week    Comment: occ  . Drug use: Never  . Sexual activity: Not on file  Other Topics Concern  . Not on file  Social History Narrative  . Not on file   Social Determinants of Health   Financial Resource Strain:   . Difficulty of Paying Living Expenses: Not on file  Food Insecurity:   . Worried About Charity fundraiser in the Last Year: Not on file  . Ran Out of Food in the Last Year: Not on file  Transportation Needs:   . Lack of Transportation (Medical): Not on file  . Lack of Transportation (Non-Medical): Not on file  Physical Activity:   . Days of Exercise per Week: Not on file  . Minutes of Exercise per Session: Not on file  Stress:   . Feeling of Stress : Not on file  Social Connections:   . Frequency of Communication with Friends and Family: Not on file  . Frequency of Social Gatherings with Friends and Family: Not on file  . Attends Religious Services: Not on file  . Active Member of Clubs or Organizations: Not on file  . Attends Archivist Meetings: Not on file  . Marital Status: Not on file  Intimate Partner Violence:   . Fear of Current or Ex-Partner: Not on file  . Emotionally Abused: Not on file  . Physically Abused: Not on file  . Sexually Abused: Not on file     FAMILY HISTORY Family History  Problem Relation Age of Onset  . Heart disease Mother   . Heart disease Father   . Cancer Brother       Review of Systems: 12 systems reviewed Otherwise as per HPI, all other systems reviewed and negative  Physical Exam: Vitals:    11/02/20 0812 11/02/20 0814  BP:  (!) 95/56  Pulse:  75  Resp: 18 18  Temp:    SpO2: 96% 97%   Total I/O In: 420 [P.O.:420] Out: -   Intake/Output Summary (Last 24 hours) at 11/02/2020 1107 Last data filed at 11/02/2020 1003 Gross per 24 hour  Intake 420.6 ml  Output 825 ml  Net -404.4 ml   General: Chronically ill appearing, sitting in bed, minimal distress HEENT: anicteric sclera, oropharynx clear without lesions CV: Normal rate, no audible murmur Lungs: clear to auscultation bilaterally, normal work of breathing Abd: soft, non-tender, non-distended Skin: no visible lesions or rashes Psych: alert, engaged, appropriate mood and affect Musculoskeletal: no obvious deformities Neuro: normal speech, no gross focal deficits   Test Results Reviewed Lab Results  Component Value Date   NA 133 (L) 11/02/2020   K 3.1 (L) 11/02/2020   CL 103 11/02/2020   CO2 15 (L) 11/02/2020   BUN 110 (H) 11/02/2020   CREATININE 3.45 (H) 11/02/2020   CALCIUM 8.8 (L) 11/02/2020   ALBUMIN 2.5 (L) 10/31/2020   PHOS 6.2 (H) 11/01/2020     I have reviewed all relevant outside healthcare records related to the patient's current hospitalization

## 2020-11-02 NOTE — Progress Notes (Signed)
A new Fentanyl syringe was replaced at 1425. Wasted 2 mL fentanyl with Systems analyst.

## 2020-11-02 NOTE — Progress Notes (Signed)
   I discussed patient's options with he and his sister who is his power of attorney.  Brilinta is being held plan will be for likely left above-knee amputation later next week.   Servando Snare, MD

## 2020-11-02 NOTE — Progress Notes (Signed)
Subjective:  Left leg pain being controlled with PCA pump Needed in and out cath yesterday due to bladder retention.   Objective:  Vital Signs in the last 24 hours: Temp:  [97.4 F (36.3 C)-98 F (36.7 C)] 97.4 F (36.3 C) (11/05 0433) Pulse Rate:  [65-75] 75 (11/05 0814) Resp:  [16-19] 18 (11/05 0814) BP: (94-103)/(53-65) 95/56 (11/05 0814) SpO2:  [96 %-100 %] 97 % (11/05 0814) Weight:  [66.3 kg] 66.3 kg (11/05 0433)  Intake/Output from previous day: 11/04 0701 - 11/05 0700 In: 240.6 [P.O.:240; I.V.:0.6] Out: 825 [Urine:825]  Physical Exam  Vitals and nursing note reviewed.  Constitutional:      Appearance: He is well-developed.  HENT:     Head: Normocephalic and atraumatic.  Eyes:     Conjunctiva/sclera: Conjunctivae normal.     Pupils: Pupils are equal, round, and reactive to light.  Neck:     Vascular: No JVD.  Cardiovascular:     Rate and Rhythm: Normal rate and regular rhythm.     Pulses: Intact distal pulses.          Femoral pulses are 2+ on the right side and 0 on the left side.      Popliteal pulses are 1+ on the right side and 0 on the left side.       Dorsalis pedis pulses are 0 on the right side and 0 on the left side.       Posterior tibial pulses are 0 on the right side and 0 on the left side.       Extensive mottling left lower extremity    Heart sounds: No murmur heard.   Pulmonary:     Effort: Pulmonary effort is normal.    Breath sounds: Normal breath sounds. No wheezing or rales.  Abdominal:     General: Bowel sounds are normal.     Palpations: Abdomen is soft.     Tenderness: There is no rebound.     Comments: Hepatomegaly  Musculoskeletal:        General: No tenderness. Normal range of motion.     Left lower leg: No edema.     Comments: Left thigh muscle wasting  Lymphadenopathy:     Cervical: No cervical adenopathy.  Skin:    General: Skin is warm and dry.  Neurological:     Mental Status: He is alert and oriented to person, place, and  time.     Cranial Nerves: No cranial nerve deficit.    Lab Results: BMP Recent Labs    09/25/20 0903 10/17/20 0825 10/22/20 1054 10/22/20 1054 10/26/20 1004 10/30/20 1652 10/31/20 0955 11/01/20 0928 11/02/20 0151  NA 140   < > CANCELED   < > 135   < > 132* 134* 133*  K 4.5   < > CANCELED   < > 3.9   < > 3.5 3.6 3.1*  CL 108*   < > CANCELED   < > 98   < > 103 103 103  CO2 18*   < > CANCELED   < > 11*   < > 11* 13* 15*  GLUCOSE 102*   < > 135*   < > 210*   < > 111* 119* 96  BUN 15   < > 67*   < > 79*   < > 113* 111* 110*  CREATININE 1.16   < > 6.34*   < > 5.36*   < > 4.26* 3.75* 3.45*  CALCIUM 9.4   < >  CANCELED   < > 9.1   < > 8.9 8.9 8.8*  GFRNONAA 68   < > 9*   < > 11*   < > 15* 18* 19*  GFRAA 78  --  10*  --  12*  --   --   --   --    < > = values in this interval not displayed.    CBC Recent Labs  Lab 11/02/20 0151  WBC 16.3*  RBC 3.73*  HGB 10.4*  HCT 31.1*  PLT 242  MCV 83.4  MCH 27.9  MCHC 33.4  RDW 16.4*  LYMPHSABS 0.8  MONOABS 1.0  EOSABS 0.3  BASOSABS 0.0    HEMOGLOBIN A1C Lab Results  Component Value Date   HGBA1C 7.1 (H) 10/31/2020   MPG 157.07 10/31/2020    Cardiac Panel (last 3 results) Recent Labs    10/17/20 1413  CKTOTAL 486*    BNP (last 3 results) Recent Labs    08/03/20 0644 10/22/20 1054 10/30/20 1820  BNP 751.4* 261.5* 488.4*    TSH Recent Labs    10/31/20 0429  TSH 10.137*    Lipid Panel     Component Value Date/Time   CHOL 95 (L) 09/11/2020 0806   TRIG 76 09/11/2020 0806   HDL 30 (L) 09/11/2020 0806   CHOLHDL 3.2 09/11/2020 0806   CHOLHDL 4.5 08/02/2020 0707   VLDL 15 08/02/2020 0707   LDLCALC 49 09/11/2020 0806     Hepatic Function Panel Recent Labs    08/02/20 0026 09/25/20 0903 10/31/20 0429  PROT 7.5 7.0 7.2  ALBUMIN 4.2 4.3 2.5*  AST 29 22 235*  ALT 22 25 123*  ALKPHOS 85 82 81  BILITOT 1.0 0.8 0.9  BILIDIR  --   --  0.2  IBILI  --   --  0.7   CARDIAC STUDIES:  EKG  10/30/2020: Sinus rhythm 73 bpm Nonspecific ST elevation inferior leads. Not STEMI  PV intervention 10/16/2020: Successful intravascular lithotripsy, PTCA and stenting 7.0X39 mm balloon expandable Viabahn VBX stent Rt common iliac artery  7.0X60 mm self expanding Absolute Pro stent Rt distal iliac artery  No named vessels at ankle level in both lower extremities.  Will consult vascular surgery for Rt fem-to-left profunda bypass given left foot critical limb ischemia.  Abdominal Aortic Duplex 09/21/2020:  Moderate plaque noted in the proximal, mid and distal aorta. There is an  ulcerated plaque noted in the distal abdominal aorta. No AAA. Normal iliac  artery velocity.   Abdominal Aortic Duplex 09/21/2020:  Moderate plaque noted in the proximal, mid and distal aorta. There is an  ulcerated plaque noted in the distal abdominal aorta. No AAA. Normal iliac  artery velocity.  Lower Extremity Arterial Duplex 09/21/2020:  Theright SFA is occluded in the proximal segment with reconstitution at  the level of the popliteal artery with diffuse monophasic waveform below  the knee. Right profunda femoral artery has >50% stenosis. There is  moderate mixed plaque noted throughout the right lower extremity.  Monophasic waveform throughout the left lower extremity, indicates  significant proximal disease (iliac artery).  Left SFA is occluded in the proximal segment and reconstitutes just above popliteal artery and diffuse dampened monophasic waveform throughout the lower extremity below the  knee. There is moderate mixed plaque throughout the left lower extremity.   This exam reveals severely decreased perfusion of the right lower  extremity, noted at the dorsalis pedis artery level (ABI 0.42) and  critically decreased perfusion of the  left lower extremity, noted at the  dorsalis pedis and post tibial artery level (ABI 0.03).   Echocardiogram 09/11/2020:  Left ventricle  cavity is moderately dilated. Moderate concentric  hypertrophy of the left ventricle. Moderate global and severe  inferolateral hypokinesis. LVEF 15-20%. Grade 1 diastolic dysfunction.  Normal left atrial pressure. Calculated EF 15%.  Mild to moderate mitral regurgitation.  Inadequate TR jet to estimate pulmonary artery systolic pressure. Normal  right atrial pressure.  Compared to previous study in 03/2020, LVEF is reduced from 30-35%. Mitral  regurgitation is marginally improved.   EKG 08/14/2020: Sinus rhythm 81 bpm Old inferior and anterior infarct  Coronary intervention 08/03/2020: LM: Distal 40% stenosis LAD:Mid 30% disease LCx: Subtotally occluded OM2, prox LCx 70% stenosis Successful percutaneous coronary intervention OM2-Prox LCx PTCA and overlapping stents placement  2.5 X 38 mm and 2.5 X 18 mm Resolute Onyx drug-eluting stents 100%--->0% stenosis. TIMI flow 0-->III Small caliber distal vessel with moderate diffuse disease RCA: Prox 30% stenosis. Patent mid RCA sttent 2.5 X 26 mm Resolute Onyx drug-eluting stent  LVEDP 42 mmHg  Echocardiogram 08/02/2020: 1. Moderate global and severe inferolateral hypokinesis. Left ventricular  ejection fraction, by estimation, is 30 to 35%. The left ventricle has  normal function. The left ventricle demonstrates regional wall motion  abnormalities (see scoring  diagram/findings for description). There is mild left ventricular  hypertrophy. Left ventricular diastolic parameters are consistent with  Grade II diastolic dysfunction (pseudonormalization).  2. Right ventricular systolic function is low normal. The right  ventricular size is normal.  3. Left atrial size was mildly dilated.  4. The mitral valve is grossly normal. Moderate mitral valve  regurgitation.  5. The aortic valve is tricuspid. Aortic valve regurgitation is not  visualized. Mild aortic valve sclerosis is present,  with no evidence of  aortic valve stenosis.  6. The inferior vena cava is dilated in size with >50% respiratory  variability, suggesting right atrial pressure of 8 mmHg.   Coronary intervention 08/01/2020: LM: Distal 30% stenosis LAD:Mid 30% disease LCx: Subtotally occluded OM2, bridging left-to-left and right-to-left collaterals RCA: Prox 30% stenosis. Mid 100% occlusion Successful percutaneous coronary intervention mid RCA PTCA and stent placement 2.5 X 26 mm Resolute Onyx drug-eluting stent 100%--->0% stenosis. TIMI flow 0-->III  Assessment & Recommendations:  Mike [name] a59 y.o.Caucasian male with CAD, culprit (RCA) and nonculprit (LCx) PCI after STEMI 07/2020, HFrEF, moderate MR, h/o cardiac arrest 2/2 Torsades post PCI during index hospitalization, PAD with critical limb ischemia s/p Rt iliac revascularization, residual critical limb ischemia LLE with no immediate targets for revascularization, severe bilateral renal artery stenoses, acute kidney injury following peripheral arteriogram on10/19/2021.   Shortness of breath: Likely combination of acute on chronic systolic heart failure, acute kidney injury, deconditioning; relatively stable. Chest x-ray is clean, no acute pulmonary edema. BNP mildly elevated. Monitor intake and output, daily weights. Given his acute kidney injury, avoiding Lasix unless patient has obvious pulmonary edema.  Troponin elevation: Likely supply demand mismatch in the setting of acute systolic heart failure, acute kidney injury. Do not suspect acute coronary syndrome.   Transaminitis: Likely due to congestive hepatopathy, as well related to statin.  Albumin of 2.5 also reflects malnutrition in the setting of congestive hepatopathy and systolic heart failure. In the setting of acute kidney injury, Crestor 40 mg dose needs to be adjusted. For the time being, I have stopped his Crestor.  Acute kidney injury: Cr  improving. Phos mildly elevated, but otherwise, not in uremia Consider nephrology  consult  CAD: S/p STEMI 07/2020 Conitnue Aspirin/plavix  HFrEF: Continue corlanor for now.  Unable to use ACEi/ARB/ARNI due to renal dysfunction Avoiding beta blocker and bidil use given low normal blood pressurs. Previously, hypotension contributed to his AKI  PAD: Successful revascularization of right common iliac and right external iliac artery. Un-revascularized severe PAD with critical limb ischemia left lower extremity. Given patient's severely CAD, HFrEF, surgical risk for revascularization is very high. Even with attempted percutaneous revascularization of left iliac artery, I do not think an his amputation can be avoided.  Risk of periprocedural kidney injury remains very high even with small amount of contrast use, given his systolic heart failure and bilateral renal artery stenosis. Only realistic option for pain control could be above-the-knee amputation.  His perioperative cardiac risk remains elevated owing to his systolic heart failure, as well as advanced kidney disease, bilateral renal artery stenosis and malnutrition, but relatively lower than a vascular bypass surgery. Appreciate Dr. Stephens Shire input. In order to reduce perioperative bleeding risk, I have switched his Brilinta to Plavix, after discussion with Dr. Trula Slade. Potentially looking at above the knee amputation next week. Family-sister Jeani Hawking and brother Fara Olden has questions regarding the surgery and post-operative care, which I have encouraged them to discuss with Dr. Trula Slade. In the meantime, we should optimize his renal function, and nutrition with anticipation of possible surgery next week. Should continue social nursing home placement. Logistically, he may end up requiring continued hospital stay until the surgery, followed by nursing home placement. Continue nutrition optimization. Recommend incentive spirometry.   Goals of care  : Patient unequivocally expressed his wishes to be DNR. DNR status can be rescinded  for the time of surgery. Recommends DNR, do not think he would benefit from further discussion regarding ICD. We will finalize this after further discussion with the patient.    Mike Mormon, MD Pager: [phone] Office: [phone]

## 2020-11-02 NOTE — Progress Notes (Signed)
Spoke to patient at the bedside about the new onset of diabetes. Discussed his HgbA1C of 7.1%, was given blood sugar ranges for this A1C. Questions about diet, cutting back on juices, watching blood sugars at home. Needs a PCP after discharge to follow his diabetes. Has Living Well with Diabetes booklet and was asking good questions about keeping his blood sugars under control. Dietician spoke with patient on 11/4.   Given information on diabetes and will continue to monitor blood sugars while in the hospital.  Harvel Ricks RN [name] CDE Diabetes Coordinator Pager: [phone]  8am-5pm

## 2020-11-02 NOTE — Progress Notes (Signed)
Occupational Therapy Treatment Patient Details Name: Mike [name] MRN: [id number] DOB: 1961-05-10 Today's Date: 11/02/2020    History of present illness Pt is a 59 year old man admitted on 10/30/20 with SOB and inability to walk with severe L LE pain. Pt with critical L LE ischemia. PMH: STEMI 07/2020 with moderate MR due to torsade post PCI, L LE ischemia with R iliac revascularization, CKD IV, CHF, DM.    OT comments  Patient limited this date by lower back discomfort, L leg pain, generalized weakness and poor activity tolerance.  OT focused on bed mobility, edge of bed sitting tolerance, and repositioning to get off his sacral wound.  Patient with good understanding, but requires assist due to L leg discomfort.  Patient is scheduled for L leg BKA for next week.  OT will need to reassess patient post amputation.  OT to continue in the acute setting.    Follow Up Recommendations  SNF    Equipment Recommendations  3 in 1 bedside commode;Tub/shower bench;Wheelchair (measurements OT);Wheelchair cushion (measurements OT)    Recommendations for Other Services      Precautions / Restrictions Precautions Precautions: Fall;Other (comment) Precaution Comments: sacral wound Restrictions Weight Bearing Restrictions: No       Mobility Bed Mobility Overal bed mobility: Needs Assistance Bed Mobility: Rolling;Supine to Sit;Sit to Supine Rolling: Min assist   Supine to sit: Supervision;HOB elevated Sit to supine: Mod assist                         Balance Overall balance assessment: Needs assistance Sitting-balance support: Bilateral upper extremity supported Sitting balance-Leahy Scale: Fair                                              Pertinent Vitals/ Pain       Pain Assessment: Faces Faces Pain Scale: Hurts whole lot Pain Location: L LE Pain Descriptors / Indicators: Grimacing;Guarding Pain Intervention(s): Monitored during session                                                           Frequency  Min 2X/week        Progress Toward Goals  OT Goals(current goals can now be found in the care plan section)  Progress towards OT goals: OT to reassess next treatment  Acute Rehab OT Goals Patient Stated Goal: Patient curious about next steps after L BKA OT Goal Formulation: With patient Time For Goal Achievement: 11/14/20 Potential to Achieve Goals: [place] Discharge plan remains appropriate    Co-evaluation                 AM-PAC OT "6 Clicks" Daily Activity     Outcome Measure   Help from another person eating meals?: None Help from another person taking care of personal grooming?: A Little Help from another person toileting, which includes using toliet, bedpan, or urinal?: A Little Help from another person bathing (including washing, rinsing, drying)?: A Lot Help from another person to put on and taking off regular upper body clothing?: A Little Help from another person to put on and taking off regular lower body clothing?:  A Lot 6 Click Score: 17    End of Session Equipment Utilized During Treatment: Oxygen  OT Visit Diagnosis: Unsteadiness on feet (R26.81);Other abnormalities of gait and mobility (R26.89);Pain Pain - Right/Left: Left Pain - part of body: Leg   Activity Tolerance Patient limited by pain;Patient limited by fatigue   Patient Left in bed;with call bell/phone within reach;with family/visitor present   Nurse Communication          Time: [phone] OT Time Calculation (min): 13 min  Charges: OT General Charges $OT Visit: 1 Visit OT Treatments $Therapeutic Activity: 8-22 mins  11/02/2020  Rich, OTR/L  Acute Rehabilitation Services  Office:  [phone]    Metta Clines 11/02/2020, 9:43 AM

## 2020-11-02 NOTE — Progress Notes (Signed)
Physical Therapy Treatment Patient Details Name: Mike [name] MRN: [id number] DOB: December 15, 1961 Today's Date: 11/02/2020    History of Present Illness Pt is a 59 year old man admitted on 10/30/20 with SOB and inability to walk with severe L LE pain. Pt with critical L LE ischemia. PMH: STEMI 07/2020 with moderate MR due to torsade post PCI, L LE ischemia with R iliac revascularization, CKD IV, CHF, DM.     PT Comments    Session focused on functional exercises focusing on core and B LE strengthening. HEP provided to patient on bed level and seated exercises to continue strengthening. Educated patient about importance of strengthening and positioning prior to scheduled L AKA next week. Instructed patient on positioning to allow hips to relax in neutral position to avoid tight hip flexors/contractures to set him up for a good prognosis in hopes to get a prosthesis. PT will continue to follow acutely. Will need to reassess following L AKA next week.  HEP ([email or web address]) Access Code: [email or web address]   Follow Up Recommendations  SNF     Equipment Recommendations  Rolling Irais Mottram with 5" wheels;3in1 (PT);Wheelchair (measurements PT);Wheelchair cushion (measurements PT)    Recommendations for Other Services       Precautions / Restrictions Precautions Precautions: Fall;Other (comment) Precaution Comments: sacral wound Restrictions Weight Bearing Restrictions: No    Mobility  Bed Mobility Overal bed mobility: Needs Assistance Bed Mobility: Rolling;Supine to Sit;Sit to Supine Rolling: Min assist   Supine to sit: Supervision;HOB elevated Sit to supine: Mod assist      Transfers                    Ambulation/Gait                 Stairs             Wheelchair Mobility    Modified Rankin (Stroke Patients Only)       Balance Overall balance assessment: Needs assistance Sitting-balance support:  (long sitting) Sitting balance-Leahy Scale: Fair                                       Cognition Arousal/Alertness: Awake/alert Behavior During Therapy: WFL for tasks assessed/performed Overall Cognitive Status: Within Functional Limits for tasks assessed                                        Exercises General Exercises - Lower Extremity Ankle Circles/Pumps: AAROM;Left;5 reps Quad Sets: AROM;Left;10 reps Heel Slides: AAROM;Left;10 reps Other Exercises Other Exercises: Pull to sit emphasizing core strengthening x 10     General Comments General comments (skin integrity, edema, etc.): Education provided on importance of mobility and stretching to avoid tightening of muscles or contractures of the hip prior to his scheduled AKA. Instructed on positioning to allow hip to relax in a neutral position. HEP provided and instructed.       Pertinent Vitals/Pain Pain Assessment: Faces Faces Pain Scale: Hurts even more Pain Location: L LE Pain Descriptors / Indicators: Grimacing;Guarding Pain Intervention(s): Limited activity within patient's tolerance;Monitored during session;Repositioned    Home Living                      Prior Function            PT Goals (  current goals can now be found in the care plan section) Acute Rehab PT Goals Patient Stated Goal: get stronger before L AKA next week PT Goal Formulation: With patient Time For Goal Achievement: 11/14/20 Potential to Achieve Goals: Fair Progress towards PT goals: Progressing toward goals    Frequency    Min 3X/week      PT Plan Current plan remains appropriate    Co-evaluation              AM-PAC PT "6 Clicks" Mobility   Outcome Measure  Help needed turning from your back to your side while in a flat bed without using bedrails?: None Help needed moving from lying on your back to sitting on the side of a flat bed without using bedrails?: A Little Help needed moving to and from a bed to a chair (including a wheelchair)?: A  Little Help needed standing up from a chair using your arms (e.g., wheelchair or bedside chair)?: A Little Help needed to walk in hospital room?: A Little Help needed climbing 3-5 steps with a railing? : A Lot 6 Click Score: 18    End of Session Equipment Utilized During Treatment: Oxygen Activity Tolerance: Patient limited by pain;Patient limited by fatigue Patient left: in bed;with call bell/phone within reach;with family/visitor present Nurse Communication: Mobility status PT Visit Diagnosis: Pain;Difficulty in walking, not elsewhere classified (R26.2);Other abnormalities of gait and mobility (R26.89);Unsteadiness on feet (R26.81);Muscle weakness (generalized) (M62.81) Pain - Right/Left: Left Pain - part of body: Leg     Time: 1140-1201 PT Time Calculation (min) (ACUTE ONLY): 21 min  Charges:  $Therapeutic Exercise: 8-22 mins                     Perrin Maltese, PT, DPT Acute Rehabilitation Services Pager [phone] Office [phone]    Mike [name] 11/02/2020, 12:30 PM

## 2020-11-03 DIAGNOSIS — Z66 Do not resuscitate: Secondary | ICD-10-CM | POA: Diagnosis not present

## 2020-11-03 DIAGNOSIS — I5042 Chronic combined systolic (congestive) and diastolic (congestive) heart failure: Secondary | ICD-10-CM | POA: Diagnosis not present

## 2020-11-03 DIAGNOSIS — Z7189 Other specified counseling: Secondary | ICD-10-CM

## 2020-11-03 DIAGNOSIS — I70229 Atherosclerosis of native arteries of extremities with rest pain, unspecified extremity: Secondary | ICD-10-CM | POA: Diagnosis not present

## 2020-11-03 LAB — GLUCOSE, CAPILLARY
Glucose-Capillary: 101 mg/dL — ABNORMAL HIGH (ref 70–99)
Glucose-Capillary: 111 mg/dL — ABNORMAL HIGH (ref 70–99)
Glucose-Capillary: 131 mg/dL — ABNORMAL HIGH (ref 70–99)
Glucose-Capillary: 170 mg/dL — ABNORMAL HIGH (ref 70–99)
Glucose-Capillary: 179 mg/dL — ABNORMAL HIGH (ref 70–99)
Glucose-Capillary: 75 mg/dL (ref 70–99)

## 2020-11-03 LAB — CBC WITH DIFFERENTIAL/PLATELET
Abs Immature Granulocytes: 0.11 10*3/uL — ABNORMAL HIGH (ref 0.00–0.07)
Basophils Absolute: 0 10*3/uL (ref 0.0–0.1)
Basophils Relative: 0 %
Eosinophils Absolute: 0.1 10*3/uL (ref 0.0–0.5)
Eosinophils Relative: 1 %
HCT: 31.9 % — ABNORMAL LOW (ref 39.0–52.0)
Hemoglobin: 10.5 g/dL — ABNORMAL LOW (ref 13.0–17.0)
Immature Granulocytes: 1 %
Lymphocytes Relative: 5 %
Lymphs Abs: 0.8 10*3/uL (ref 0.7–4.0)
MCH: 28.4 pg (ref 26.0–34.0)
MCHC: 32.9 g/dL (ref 30.0–36.0)
MCV: 86.2 fL (ref 80.0–100.0)
Monocytes Absolute: 0.8 10*3/uL (ref 0.1–1.0)
Monocytes Relative: 5 %
Neutro Abs: 13.9 10*3/uL — ABNORMAL HIGH (ref 1.7–7.7)
Neutrophils Relative %: 88 %
Platelets: 248 10*3/uL (ref 150–400)
RBC: 3.7 MIL/uL — ABNORMAL LOW (ref 4.22–5.81)
RDW: 16.9 % — ABNORMAL HIGH (ref 11.5–15.5)
WBC: 15.7 10*3/uL — ABNORMAL HIGH (ref 4.0–10.5)
nRBC: 0 % (ref 0.0–0.2)

## 2020-11-03 LAB — BASIC METABOLIC PANEL
Anion gap: 12 (ref 5–15)
BUN: 110 mg/dL — ABNORMAL HIGH (ref 8–23)
CO2: 15 mmol/L — ABNORMAL LOW (ref 22–32)
Calcium: 9.2 mg/dL (ref 8.9–10.3)
Chloride: 107 mmol/L (ref 98–111)
Creatinine, Ser: 3.06 mg/dL — ABNORMAL HIGH (ref 0.61–1.24)
GFR, Estimated: 22 mL/min — ABNORMAL LOW (ref >60)
Glucose, Bld: 116 mg/dL — ABNORMAL HIGH (ref 70–99)
Potassium: 4.5 mmol/L (ref 3.5–5.1)
Sodium: 134 mmol/L — ABNORMAL LOW (ref 135–145)

## 2020-11-03 MED ORDER — OXYCODONE HCL ER 15 MG PO T12A
15.0000 mg | EXTENDED_RELEASE_TABLET | Freq: Two times a day (BID) | ORAL | Status: DC
  Administered 2020-11-03 – 2020-11-07 (×9): 15 mg via ORAL
  Filled 2020-11-03 (×9): qty 1

## 2020-11-03 MED ORDER — SODIUM BICARBONATE 650 MG PO TABS
1300.0000 mg | ORAL_TABLET | Freq: Two times a day (BID) | ORAL | Status: DC
  Administered 2020-11-03 – 2020-11-15 (×24): 1300 mg via ORAL
  Filled 2020-11-03 (×24): qty 2

## 2020-11-03 MED ORDER — MORPHINE SULFATE ER 15 MG PO TBCR: 30.0000 mg | EXTENDED_RELEASE_TABLET | Freq: Two times a day (BID) | ORAL | Status: DC

## 2020-11-03 MED ORDER — CARVEDILOL 3.125 MG PO TABS
1.5600 mg | ORAL_TABLET | Freq: Two times a day (BID) | ORAL | Status: DC
  Administered 2020-11-03 – 2020-11-05 (×4): 1.56 mg via ORAL
  Filled 2020-11-03 (×4): qty 1

## 2020-11-03 NOTE — Progress Notes (Signed)
Replaced Fentanyl syringe, wasted 2 mL fentanyl with Barbie RN.

## 2020-11-03 NOTE — Progress Notes (Signed)
Nephrology Follow-Up Consult note   Assessment/Recommendations: Mike [name] is a/an 59 y.o. male with a past medical history significant for CAD s/p STEMI, PAD w/ h/po critical limb ischemia, hld, renal artery stenosis, systolic heart failure related to ischemic cardiomyopathy, elevated Creatinine who present w/ critical limb ischemia  Elevated Creatinine, likely AKI on CKD.:  Significant change in creatinine since August when he had a STEMI.  Creatinine was 0.5 at that time and rose to 1.1 prior to discharge likely demonstrating about a 40% loss in GFR. Now with significant rise in Crt last month and no definitive baseline.  It is difficult to say how much of his creatinine elevation is related to CKD and how much is AKI given he has not had stable renal function over an extended period of time.  Thinks he was taking ibuprofen which may have contributed to some AKI.  Creatinine is improving at this time. -Continue to monitor creatinine daily.  Volume appears optimized at this time -Could consider the use of Midodrin to improve MAP but the increased afterload could exacerbate his heart failure -Monitor Daily I/Os, Daily weight  -Avoid nephrotoxic medications including NSAIDs and Vanc/Zosyn combo -Currently no indication for HD -Agree with palliative care involvement but patient indicates he would want hemodialysis if necessary  Perioperative renal assessment: Please refer to perioperative assessment and original consult note on 11/5.  Critical Limb Ischemia: cardiology and proceduralist involved. Will follow plans for intervention and tailor recommendations based on decisions.  Hypokalemia: continue oral replacement PRN.  Potassium acceptable at 4.5 today.  Non-anion gap metabolic acidosis: Likely associated with kidney disease.  Start oral bicarbonate 1300 mg twice daily  COPD: Continue supplemental oxygen and inhalers as needed.   Recommendations conveyed to primary service.     [facility] 11/03/2020 9:38 AM  ___________________________________________________________  CC: AKI on CKD  Interval History/Subjective: Patient states he is having a lot of pain today.  He also has developed constipation.  Sometimes he has hard time emptying his bladder.   Medications:  Current Facility-Administered Medications  Medication Dose Route Frequency Provider Last Rate Last Admin  . 0.9 %  sodium chloride infusion   Intravenous PRN Charlynne Cousins, MD 10 mL/hr at 11/02/20 2000 Rate Verify at 11/02/20 2000  . acetaminophen (TYLENOL) tablet 650 mg  650 mg Oral Q6H PRN Toy Baker, MD       Or  . acetaminophen (TYLENOL) suppository 650 mg  650 mg Rectal Q6H PRN Doutova, Anastassia, MD      . aspirin EC tablet 81 mg  81 mg Oral Daily Doutova, Anastassia, MD   81 mg at 11/03/20 0813  . clopidogrel (PLAVIX) tablet 75 mg  75 mg Oral Daily Patwardhan, Manish J, MD   75 mg at 11/03/20 0813  . diphenhydrAMINE (BENADRYL) injection 12.5 mg  12.5 mg Intravenous Q6H PRN Charlynne Cousins, MD       Or  . diphenhydrAMINE (BENADRYL) 12.5 MG/5ML elixir 12.5 mg  12.5 mg Oral Q6H PRN Charlynne Cousins, MD      . feeding supplement (ENSURE ENLIVE / ENSURE PLUS) liquid 237 mL  237 mL Oral BID BM Charlynne Cousins, MD   237 mL at 11/03/20 0814  . fentaNYL 50 mcg/mL PCA injection   Intravenous Q4H Charlynne Cousins, MD   1,000 mcg at 11/03/20 0735  . Gerhardt's butt cream   Topical BID Charlynne Cousins, MD   Given at 11/03/20 [phone]  . heparin  injection 5,000 Units  5,000 Units Subcutaneous Q8H Toy Baker, MD   5,000 Units at 11/03/20 0522  . insulin aspart (novoLOG) injection 0-6 Units  0-6 Units Subcutaneous Q4H Toy Baker, MD   1 Units at 11/02/20 1643  . ivabradine (CORLANOR) tablet 2.5 mg  2.5 mg Oral BID WC Doutova, Anastassia, MD   2.5 mg at 11/03/20 0813  . multivitamin with minerals tablet 1 tablet  1 tablet  Oral Daily Charlynne Cousins, MD   1 tablet at 11/03/20 0814  . naloxone [facility]) injection 0.4 mg  0.4 mg Intravenous PRN Charlynne Cousins, MD       And  . sodium chloride flush (NS) 0.9 % injection 9 mL  9 mL Intravenous PRN Charlynne Cousins, MD      . nicotine (NICODERM CQ - dosed in mg/24 hr) patch 7 mg  7 mg Transdermal Q24H Doutova, Anastassia, MD   7 mg at 10/31/20 1032  . nitroGLYCERIN (NITROSTAT) SL tablet 0.4 mg  0.4 mg Sublingual Q5 min PRN Doutova, Anastassia, MD      . ondansetron (ZOFRAN) injection 4 mg  4 mg Intravenous Q6H PRN Charlynne Cousins, MD      . oxyCODONE (OXYCONTIN) 12 hr tablet 15 mg  15 mg Oral Q12H Charlynne Cousins, MD          Review of Systems: 10 systems reviewed and negative except per interval history/subjective  Physical Exam: Vitals:   11/03/20 0801 11/03/20 0805  BP: 120/61   Pulse: 86   Resp:  18  Temp:    SpO2: 100% 98%   No intake/output data recorded.  Intake/Output Summary (Last 24 hours) at 11/03/2020 4540 Last data filed at 11/03/2020 0400 Gross per 24 hour  Intake 293.31 ml  Output 1450 ml  Net -1156.69 ml   Constitutional: [place] ill-appearing, uncomfortable ENMT: ears and nose without scars or lesions, dry mucous membranes CV: normal rate, no edema Respiratory: clear to auscultation, normal work of breathing Gastrointestinal: soft, non-tender, no palpable masses or hernias Skin: no visible lesions or rashes Psych: alert, judgement/insight appropriate, appropriate mood and affect   Test Results I personally reviewed new and old clinical labs and radiology tests Lab Results  Component Value Date   NA 134 (L) 11/03/2020   K 4.5 11/03/2020   CL 107 11/03/2020   CO2 15 (L) 11/03/2020   BUN 110 (H) 11/03/2020   CREATININE 3.06 (H) 11/03/2020   CALCIUM 9.2 11/03/2020   ALBUMIN 2.5 (L) 10/31/2020   PHOS 6.2 (H) 11/01/2020

## 2020-11-03 NOTE — Progress Notes (Signed)
[facility] PROGRESS NOTE    Progress Note  CHAISE MAHABIR  OJJ:[id number] DOB: 1961/06/15 DOA: 10/30/2020 PCP: Patient, No Pcp Per     Brief Narrative:   Mike [name] is an 59 y.o. male past medical history significant for STEMI in 08/17/2020 with moderate MR cardiac arrest secondary to torsade post PCI, he has no known resolve ST segment elevation in the inferior leads since his STEMI in 08/17/2020, critical limb ischemia with right iliac revascularization with residual limb ischemia in the left lower extremity, severe bilateral renal artery stenosis, acute kidney injury following arteriogram on 10/16/2020 is into the hospital complaining of shortness of breath and inability to walk due to severe leg pain.  Cardiology was consulted and unfortunately he has an extremely high risk of limb loss and in view of his underlying chronic kidney disease stage IV chronic systolic heart failure that is due to ischemic cardiomyopathy he has an extremely high risk of mortality.  Urology recommended to consult   Assessment/Plan:   Critical limb ischemia with pain at rest on the left lower extremity with no revascularization at this point, vascular surgery consult was obtained on 10/17/2020: Vascular surgery was consulted and they recommended above-the-knee amputation, he was changed to aspirin and Plavix.  Surgery will probably be sometime early next week. Pain is not controlled start him on long-acting narcotic continue PCA pump.  Ongoing tobacco abuse: The admitting physician spoke to him and at this point in time he is interested in quitting and nicotine patch was ordered.  Hyperlipidemia: Continue statins.  Coronary artery disease: Continue statins, beta-blockers, aspirin and Plavix. Patient is chronic recurrent chest pain troponins are stable EKG changes are chronic.  Chronic kidney disease stage IV: Creatinine appears to be at baseline.  Hyperglycemia/diabetes mellitus type  2: No prior history of diabetes, HcA1c of 7.1, will continue sliding scale insulin.  History of ischemic cardiomyopathy: Appears to be euvolemic blood pressure is soft holding Lasix.  Leukocytosis: He has remained afebrile, likely due to ischemic limb  Severe protein caloric malnutrition:  DVT prophylaxis: lovenxo Family Communication:none Status is: Inpatient  Remains inpatient appropriate because:Hemodynamically unstable   Dispo: The patient is from: Home              Anticipated d/c is to: SNF              Anticipated d/c date is: 5 days              Patient currently is not medically stable to d/c.  Once amputation has been performed.        Code Status:     Code Status Orders  (From admission, onward)           Start     Ordered   10/31/20 0235  Limited resuscitation (code)  Continuous       Question Answer Comment  In the event of cardiac or respiratory ARREST: Initiate Code Blue, Call Rapid Response Yes   In the event of cardiac or respiratory ARREST: Perform CPR Yes   In the event of cardiac or respiratory ARREST: Perform Intubation/Mechanical Ventilation No   In the event of cardiac or respiratory ARREST: Use NIPPV/BiPAp only if indicated Yes   In the event of cardiac or respiratory ARREST: Administer ACLS medications if indicated Yes   In the event of cardiac or respiratory ARREST: Perform Defibrillation or Cardioversion if indicated Yes      10/31/20 0234  Code Status History     Date Active Date Inactive Code Status Order ID Comments User Context   10/16/2020 1912 10/18/2020 2008 Full Code [id number]  Nigel Mormon, MD Inpatient   08/02/2020 0035 08/04/2020 1745 Full Code [id number]  Nigel Mormon, MD Inpatient   Advance Care Planning Activity      Advance Directive Documentation      Most Recent Value  Type of Advance Directive Living will, Healthcare Power of Attorney  Pre-existing out of facility DNR order (yellow  form or pink MOST form) --  "MOST" Form in Place? --         IV Access:    Peripheral IV   Procedures and diagnostic studies:   No results found.   Medical Consultants:    None.  Anti-Infectives:   none  Subjective:    YUYA VANWINGERDEN relates his pain is a little bit better controlled.  Objective:    Vitals:   11/03/20 0333 11/03/20 0513 11/03/20 0801 11/03/20 0805  BP: 105/66  120/61   Pulse: 82  86   Resp: 15 20  18   Temp: (!) 97.5 F (36.4 C)     TempSrc: Oral     SpO2: 93% 95% 100% 98%  Weight:      Height:       SpO2: 98 % O2 Flow Rate (L/min): 2 L/min   Intake/Output Summary (Last 24 hours) at 11/03/2020 0909 Last data filed at 11/03/2020 0400 Gross per 24 hour  Intake 293.31 ml  Output 1450 ml  Net -1156.69 ml   Filed Weights   11/01/20 0042 11/02/20 0433 11/03/20 0035  Weight: 67.1 kg 66.3 kg 67.1 kg    Exam: General exam: In no acute distress. Respiratory system: Good air movement and clear to auscultation. Cardiovascular system: S1 & S2 heard, RRR. No JVD. Gastrointestinal system: Abdomen is nondistended, soft and nontender.  Extremities: No pedal edema. Skin: Left lower extremity is slightly cold  Data Reviewed:    Labs: Basic Metabolic Panel: Recent Labs  Lab 10/30/20 1652 10/30/20 1652 10/31/20 0429 10/31/20 0955 10/31/20 0955 11/01/20 0928 11/01/20 0928 11/02/20 0151 11/03/20 0358  NA 136  --   --  132*  --  134*  --  133* 134*  K 4.1   < >  --  3.5   < > 3.6   < > 3.1* 4.5  CL 102  --   --  103  --  103  --  103 107  CO2 11*  --   --  11*  --  13*  --  15* 15*  GLUCOSE 205*  --   --  111*  --  119*  --  96 116*  BUN 104*  --   --  113*  --  111*  --  110* 110*  CREATININE 4.62*  --   --  4.26*  --  3.75*  --  3.45* 3.06*  CALCIUM 9.3  --   --  8.9  --  8.9  --  8.8* 9.2  MG  --   --  2.4  --   --   --   --   --   --   PHOS  --   --  6.5*  --   --  6.2*  --   --   --    < > = values in this interval not  displayed.   GFR Estimated Creatinine Clearance: 24.1 mL/min (A) (by C-G formula  based on SCr of 3.06 mg/dL (H)). Liver Function Tests: Recent Labs  Lab 10/31/20 0429  AST 235*  ALT 123*  ALKPHOS 81  BILITOT 0.9  PROT 7.2  ALBUMIN 2.5*   No results for input(s): LIPASE, AMYLASE in the last 168 hours. No results for input(s): AMMONIA in the last 168 hours. Coagulation profile Recent Labs  Lab 10/30/20 1822  INR 1.2   COVID-19 Labs  No results for input(s): DDIMER, FERRITIN, LDH, CRP in the last 72 hours.  Lab Results  Component Value Date   SARSCOV2NAA NEGATIVE 10/30/2020   [place] NEGATIVE 10/12/2020   [place] NEGATIVE 08/02/2020    CBC: Recent Labs  Lab 10/30/20 1652 10/31/20 0429 11/01/20 0928 11/02/20 0151 11/03/20 0358  WBC 21.9* 19.5* 18.2* 16.3* 15.7*  NEUTROABS  --  17.0* 16.1* 14.1* 13.9*  HGB 12.2* 11.4* 11.4* 10.4* 10.5*  HCT 37.3* 34.6* 34.0* 31.1* 31.9*  MCV 85.4 83.8 85.2 83.4 86.2  PLT 379 296 288 242 248   Cardiac Enzymes: No results for input(s): CKTOTAL, CKMB, CKMBINDEX, TROPONINI in the last 168 hours. BNP (last 3 results) Recent Labs    09/25/20 0904 10/12/20 0812  PROBNP 2,404* 1,642*   CBG: Recent Labs  Lab 11/02/20 1615 11/02/20 2048 11/03/20 0026 11/03/20 0506 11/03/20 0804  GLUCAP 167* 100* 101* 111* 131*   D-Dimer: No results for input(s): DDIMER in the last 72 hours. Hgb A1c: No results for input(s): HGBA1C in the last 72 hours. Lipid Profile: No results for input(s): CHOL, HDL, LDLCALC, TRIG, CHOLHDL, LDLDIRECT in the last 72 hours. Thyroid function studies: No results for input(s): TSH, T4TOTAL, T3FREE, THYROIDAB in the last 72 hours.  Invalid input(s): FREET3 Anemia work up: No results for input(s): VITAMINB12, FOLATE, FERRITIN, TIBC, IRON, RETICCTPCT in the last 72 hours. Sepsis Labs: Recent Labs  Lab 10/30/20 1652 10/30/20 2111 10/30/20 2141 10/31/20 0133 10/31/20 0429 11/01/20 0928  11/02/20 0151 11/03/20 0358  PROCALCITON  --  1.19  --   --   --   --   --   --   WBC   < >  --   --   --  19.5* 18.2* 16.3* 15.7*  LATICACIDVEN  --   --  1.5 1.3  --   --   --   --    < > = values in this interval not displayed.   Microbiology Recent Results (from the past 240 hour(s))  Respiratory Panel by RT PCR (Flu A&B, Covid) - Nasopharyngeal Swab     Status: None   Collection Time: 10/30/20  7:51 PM   Specimen: Nasopharyngeal Swab  Result Value Ref Range Status   SARS Coronavirus 2 by RT PCR NEGATIVE NEGATIVE Final    Comment: (NOTE) SARS-CoV-2 target nucleic acids are NOT DETECTED.  The SARS-CoV-2 RNA is generally detectable in upper respiratoy specimens during the acute phase of infection. The lowest concentration of SARS-CoV-2 viral copies this assay can detect is 131 copies/mL. A negative result does not preclude SARS-Cov-2 infection and should not be used as the sole basis for treatment or other patient management decisions. A negative result may occur with  improper specimen collection/handling, submission of specimen other than nasopharyngeal swab, presence of viral mutation(s) within the areas targeted by this assay, and inadequate number of viral copies (<131 copies/mL). A negative result must be combined with clinical observations, patient history, and epidemiological information. The expected result is Negative.  Fact Sheet for Patients:  PinkCheek.be  Fact Sheet for Healthcare Providers:  GravelBags.it  This test is no t yet approved or cleared by the [place] and  has been authorized for detection and/or diagnosis of SARS-CoV-2 by FDA under an Emergency Use Authorization (EUA). This EUA will remain  in effect (meaning this test can be used) for the duration of the COVID-19 declaration under Section 564(b)(1) of the Act, 21 U.S.C. section 360bbb-3(b)(1), unless the authorization is  terminated or revoked sooner.     Influenza A by PCR NEGATIVE NEGATIVE Final   Influenza B by PCR NEGATIVE NEGATIVE Final    Comment: (NOTE) The Xpert Xpress SARS-CoV-2/FLU/RSV assay is intended as an aid in  the diagnosis of influenza from Nasopharyngeal swab specimens and  should not be used as a sole basis for treatment. Nasal washings and  aspirates are unacceptable for Xpert Xpress SARS-CoV-2/FLU/RSV  testing.  Fact Sheet for Patients: PinkCheek.be  Fact Sheet for Healthcare Providers: GravelBags.it  This test is not yet approved or cleared by the [place] FDA and  has been authorized for detection and/or diagnosis of SARS-CoV-2 by  FDA under an Emergency Use Authorization (EUA). This EUA will remain  in effect (meaning this test can be used) for the duration of the  Covid-19 declaration under Section 564(b)(1) of the Act, 21  U.S.C. section 360bbb-3(b)(1), unless the authorization is  terminated or revoked. Performed at [facility], [place] [address], [place], [place] [address]   Culture, blood (x 2)     Status: None (Preliminary result)   Collection Time: 10/30/20  9:41 PM   Specimen: BLOOD  Result Value Ref Range Status   Specimen Description BLOOD RIGHT ANTECUBITAL  Final   Special Requests   Final    BOTTLES DRAWN AEROBIC AND ANAEROBIC Blood Culture adequate volume   Culture   Final    NO GROWTH 4 DAYS Performed at [facility], [place] [address] [place], [place] [address]    Report Status PENDING  Incomplete  Culture, blood (x 2)     Status: None (Preliminary result)   Collection Time: 10/31/20  1:20 AM   Specimen: BLOOD  Result Value Ref Range Status   Specimen Description BLOOD RIGHT ANTECUBITAL  Final   Special Requests   Final    BOTTLES DRAWN AEROBIC AND ANAEROBIC Blood Culture adequate volume   Culture   Final    NO GROWTH 3 DAYS Performed at [facility], [place] [address] [place], [place] [address]    Report Status PENDING  Incomplete     Medications:   . aspirin EC  81 mg Oral Daily  . clopidogrel  75 mg Oral Daily  . feeding supplement  237 mL Oral BID BM  . fentaNYL   Intravenous Q4H  . Gerhardt's butt cream   Topical BID  . heparin  5,000 Units Subcutaneous Q8H  . insulin aspart  0-6 Units Subcutaneous Q4H  . ivabradine  2.5 mg Oral BID WC  . multivitamin with minerals  1 tablet Oral Daily  . nicotine  7 mg Transdermal Q24H   Continuous Infusions: . sodium chloride 10 mL/hr at 11/02/20 2000      LOS: 4 days   Charlynne Cousins  [facility]  11/03/2020, 9:09 AM

## 2020-11-03 NOTE — Progress Notes (Signed)
Mike [name] is a 59 y.o. Caucasian male with CAD, culprit (RCA) and nonculprit (LCx) PCI after STEMI 07/2020, HFrEF, moderate MR, h/o cardiac arrest 2/2 Torsades post PPCI during index hospitalization, PAD with critical limb ischemia, underwent angioplasty to right iliac artery on 10/16/2020 still has residual occluded left iliac artery and long ostially occluded SFA and has a gangrene in his left second toe and critical limb ischemia.  Felt no surgical revascularization options, now admitted to the hospital with severe pain and presently on fentanyl pump.  He is evaluated by vascular surgery and plans on left above-knee amputation.  Subjective:  States that he is doing well, he has not had any dyspnea, PND or orthopnea.  No fever or chills.  Intake/Output from previous day:  I/O last 3 completed shifts: In: 533.9 [P.O.:420; I.V.:113.9] Out: 2275 [Urine:2275] Total I/O In: -  Out: 300 [Urine:300]  Blood pressure (!) 96/54, pulse 80, temperature 97.6 F (36.4 C), temperature source Oral, resp. rate 18, height 6' (1.829 m), weight 67.1 kg, SpO2 100 %.  Vitals with BMI 11/03/2020 11/03/2020 11/03/2020  Height - - -  Weight - - -  BMI - - -  Systolic 97 96 521  Diastolic 61 54 61  Pulse 74 80 86    Physical Exam Cardiovascular:     Rate and Rhythm: Normal rate and regular rhythm.     Pulses:          Popliteal pulses are 0 on the right side and 0 on the left side.       Dorsalis pedis pulses are 0 on the right side and 0 on the left side.       Posterior tibial pulses are 0 on the right side and 0 on the left side.     Heart sounds: Normal heart sounds. No murmur heard.  No gallop.      Comments: No leg edema, no JVD. Left second toe dry gangrene noted. Chronic ischemic changes involving bilateral lower extremity.  Pulmonary:     Effort: Pulmonary effort is normal.     Breath sounds: Normal breath sounds.  Abdominal:     General: Bowel sounds are normal.     Palpations: Abdomen  is soft.   Lab Results: BNP (last 3 results) Recent Labs    08/03/20 0644 10/22/20 1054 10/30/20 1820  BNP 751.4* 261.5* 488.4*   ProBNP (last 3 results) Recent Labs    09/25/20 0904 10/12/20 0812  PROBNP 2,404* 1,642*   BMP Latest Ref Rng & Units 11/03/2020 11/02/2020 11/01/2020  Glucose 70 - 99 mg/dL 116(H) 96 119(H)  BUN 8 - 23 mg/dL 110(H) 110(H) 111(H)  Creatinine 0.61 - 1.24 mg/dL 3.06(H) 3.45(H) 3.75(H)  BUN/Creat Ratio 10 - 24 - - -  Sodium 135 - 145 mmol/L 134(L) 133(L) 134(L)  Potassium 3.5 - 5.1 mmol/L 4.5 3.1(L) 3.6  Chloride 98 - 111 mmol/L 107 103 103  CO2 22 - 32 mmol/L 15(L) 15(L) 13(L)  Calcium 8.9 - 10.3 mg/dL 9.2 8.8(L) 8.9   Hepatic Function Latest Ref Rng & Units 10/31/2020 09/25/2020 08/02/2020  Total Protein 6.5 - 8.1 g/dL 7.2 7.0 7.5  Albumin 3.5 - 5.0 g/dL 2.5(L) 4.3 4.2  AST 15 - 41 U/L 235(H) 22 29  ALT 0 - 44 U/L 123(H) 25 22  Alk Phosphatase 38 - 126 U/L 81 82 85  Total Bilirubin 0.3 - 1.2 mg/dL 0.9 0.8 1.0  Bilirubin, Direct 0.0 - 0.2 mg/dL 0.2 - -  CBC Latest Ref Rng & Units 11/03/2020 11/02/2020 11/01/2020  WBC 4.0 - 10.5 K/uL 15.7(H) 16.3(H) 18.2(H)  Hemoglobin 13.0 - 17.0 g/dL 10.5(L) 10.4(L) 11.4(L)  Hematocrit 39 - 52 % 31.9(L) 31.1(L) 34.0(L)  Platelets 150 - 400 K/uL 248 242 288   Lipid Panel     Component Value Date/Time   CHOL 95 (L) 09/11/2020 0806   TRIG 76 09/11/2020 0806   HDL 30 (L) 09/11/2020 0806   CHOLHDL 3.2 09/11/2020 0806   CHOLHDL 4.5 08/02/2020 0707   VLDL 15 08/02/2020 0707   LDLCALC 49 09/11/2020 0806   Cardiac Panel (last 3 results) No results for input(s): CKTOTAL, CKMB, TROPONINI, RELINDX in the last 72 hours.  HEMOGLOBIN A1C Lab Results  Component Value Date   HGBA1C 7.1 (H) 10/31/2020   MPG 157.07 10/31/2020   TSH Recent Labs    10/31/20 0429  TSH 10.137*   Imaging: No results found.  Cardiac Studies:  Coronary intervention 08/01/2020: LM: Distal 30% stenosis LAD:Mid 30% disease LCx:  Subtotally occluded OM2, bridging left-to-left and right-to-left collaterals RCA: Prox 30% stenosis. Mid 100% occlusion Successful percutaneous coronary intervention mid RCA PTCA and stent placement 2.5 X 26 mm Resolute Onyx drug-eluting stent  Abdominal Aortic Duplex 09/21/2020:  Moderate plaque noted in the proximal, mid and distal aorta. There is an  ulcerated plaque noted in the distal abdominal aorta. No AAA. Normal iliac  artery velocity.  Abdominal Aortic Duplex 09/21/2020:  Moderate plaque noted in the proximal, mid and distal aorta. There is an  ulcerated plaque noted in the distal abdominal aorta. No AAA. Normal iliac  artery velocity.  Lower Extremity Arterial Duplex 09/21/2020:  Theright SFA is occluded in the proximal segment with reconstitution at  the level of the popliteal artery with diffuse monophasic waveform below  the knee. Right profunda femoral artery has >50% stenosis. There is  moderate mixed plaque noted throughout the right lower extremity.  Monophasic waveform throughout the left lower extremity, indicates  significant proximal disease (iliac artery).  Left SFA is occluded in the proximal segment and reconstitutes just above popliteal artery and diffuse dampened monophasic waveform throughout the lower extremity below the  knee. There is moderate mixed plaque throughout the left lower extremity.   This exam reveals severely decreased perfusion of the right lower  extremity, noted at the dorsalis pedis artery level (ABI 0.42) and  critically decreased perfusion of the left lower extremity, noted at the  dorsalis pedis and post tibial artery level (ABI 0.03).   Echocardiogram 09/11/2020:  Left ventricle cavity is moderately dilated. Moderate concentric  hypertrophy of the left ventricle. Moderate global and severe  inferolateral hypokinesis. LVEF 15-20%. Grade 1 diastolic dysfunction.  Normal left atrial pressure. Calculated  EF 15%.  Mild to moderate mitral regurgitation.  Inadequate TR jet to estimate pulmonary artery systolic pressure. Normal  right atrial pressure.  Compared to previous study in 03/2020, LVEF is reduced from 30-35%. Mitral  regurgitation is marginally improved.   PV intervention 10/16/2020: Successful intravascular lithotripsy, PTCA and stenting 7.0X39 mm balloon expandable Viabahn VBX stent Rt common iliac artery  7.0X60 mm self expanding Absolute Pro stent Rt distal iliac artery No named vessels at ankle level in both lower extremities.  Will consult vascular surgery for Rt fem-to-left profunda bypass given left foot critical limb ischemia.  EKG11/01/2020: Sinus rhythm 73 bpm Nonspecific ST elevation inferior leads.   Scheduled Meds: . aspirin EC  81 mg Oral Daily  . clopidogrel  75 mg Oral Daily  .  feeding supplement  237 mL Oral BID BM  . fentaNYL   Intravenous Q4H  . Gerhardt's butt cream   Topical BID  . heparin  5,000 Units Subcutaneous Q8H  . insulin aspart  0-6 Units Subcutaneous Q4H  . ivabradine  2.5 mg Oral BID WC  . multivitamin with minerals  1 tablet Oral Daily  . nicotine  7 mg Transdermal Q24H  . oxyCODONE  15 mg Oral Q12H  . sodium bicarbonate  1,300 mg Oral BID   Continuous Infusions: . sodium chloride 10 mL/hr at 11/02/20 2000   PRN Meds:.sodium chloride, acetaminophen **OR** acetaminophen, diphenhydrAMINE **OR** diphenhydrAMINE, naloxone **AND** sodium chloride flush, nitroGLYCERIN, ondansetron (ZOFRAN) IV  Assessment/Plan:  1.  Ischemic cardiomyopathy with severe LV systolic dysfunction 2.  Chronic systolic heart failure 3.  Severe peripheral arterial disease with gangrene involving left leg and critical limb ischemia right leg with revascularization of right lower extremity but no revascularization options for the left leg, plan on above-knee amputation due to gangrene and intolerable pain and rest pain. 4.  Acute renal failure following  peripheral arteriogram, serum creatinine improving, stage IV chronic kidney disease presently.  Recommendation: Patient's blood pressure is very soft and hence not been able to use appropriate guideline directed medical therapy including beta-blockers and ACE inhibitors or BiDil.  ACE inhibitor's are also contraindicated due to renal failure.  Fortunately he has remained stable without clinical evidence of heart failure.  There is no volume overload state.  He is probably at his best right now to proceed with amputation, overall mortality risk is extremely high.  He is now DNR.  I could certainly try carvedilol 3.125 mg 1/2 tablet twice daily starting today.   Adrian Prows, MD, [facility] 11/03/2020, 4:22 PM Office: [phone] Pager: [phone]

## 2020-11-04 DIAGNOSIS — Z9889 Other specified postprocedural states: Secondary | ICD-10-CM | POA: Diagnosis not present

## 2020-11-04 DIAGNOSIS — I5023 Acute on chronic systolic (congestive) heart failure: Secondary | ICD-10-CM | POA: Diagnosis not present

## 2020-11-04 DIAGNOSIS — I70229 Atherosclerosis of native arteries of extremities with rest pain, unspecified extremity: Secondary | ICD-10-CM | POA: Diagnosis not present

## 2020-11-04 DIAGNOSIS — I5042 Chronic combined systolic (congestive) and diastolic (congestive) heart failure: Secondary | ICD-10-CM | POA: Diagnosis not present

## 2020-11-04 LAB — GLUCOSE, CAPILLARY
Glucose-Capillary: 123 mg/dL — ABNORMAL HIGH (ref 70–99)
Glucose-Capillary: 134 mg/dL — ABNORMAL HIGH (ref 70–99)
Glucose-Capillary: 166 mg/dL — ABNORMAL HIGH (ref 70–99)
Glucose-Capillary: 181 mg/dL — ABNORMAL HIGH (ref 70–99)
Glucose-Capillary: 188 mg/dL — ABNORMAL HIGH (ref 70–99)
Glucose-Capillary: 93 mg/dL (ref 70–99)

## 2020-11-04 LAB — RENAL FUNCTION PANEL
Albumin: 2.6 g/dL — ABNORMAL LOW (ref 3.5–5.0)
Anion gap: 13 (ref 5–15)
BUN: 96 mg/dL — ABNORMAL HIGH (ref 8–23)
CO2: 15 mmol/L — ABNORMAL LOW (ref 22–32)
Calcium: 9.2 mg/dL (ref 8.9–10.3)
Chloride: 106 mmol/L (ref 98–111)
Creatinine, Ser: 2.25 mg/dL — ABNORMAL HIGH (ref 0.61–1.24)
GFR, Estimated: 32 mL/min — ABNORMAL LOW (ref >60)
Glucose, Bld: 102 mg/dL — ABNORMAL HIGH (ref 70–99)
Phosphorus: 3.5 mg/dL (ref 2.5–4.6)
Potassium: 4.8 mmol/L (ref 3.5–5.1)
Sodium: 134 mmol/L — ABNORMAL LOW (ref 135–145)

## 2020-11-04 LAB — CULTURE, BLOOD (ROUTINE X 2)
Culture: NO GROWTH
Special Requests: ADEQUATE

## 2020-11-04 NOTE — Progress Notes (Signed)
[facility] PROGRESS NOTE    Progress Note  Mike [name]  ALP:[id number] DOB: [birth year] DOA: 10/30/2020 PCP: Patient, No Pcp Per     Brief Narrative:   Mike [name] is an 59 y.o. male past medical history significant for STEMI in 08/17/2020 with moderate MR cardiac arrest secondary to torsade post PCI, he has no known resolve ST segment elevation in the inferior leads since his STEMI in 08/17/2020, critical limb ischemia with right iliac revascularization with residual limb ischemia in the left lower extremity, severe bilateral renal artery stenosis, acute kidney injury following arteriogram on 10/16/2020 is into the hospital complaining of shortness of breath and inability to walk due to severe leg pain.  Cardiology was consulted and unfortunately he has an extremely high risk of limb loss and in view of his underlying chronic kidney disease stage IV chronic systolic heart failure that is due to ischemic cardiomyopathy he has an extremely high risk of mortality.  Urology recommended to consult   Assessment/Plan:   Critical limb ischemia with pain at rest on the left lower extremity with no revascularization at this point, vascular surgery consult was obtained on 10/17/2020: Vascular surgery was consulted and they recommended above-the-knee amputation, he was changed to aspirin and Plavix.  Surgery will probably be sometime early next week. Continue long-acting Oxley plus PCA pump pain seems to be control  Ongoing tobacco abuse: The admitting physician spoke to him and at this point in time he is interested in quitting and nicotine patch was ordered.  Hyperlipidemia: Continue statins.  Coronary artery disease: Continue statins, beta-blockers, aspirin and Plavix. Patient is chronic recurrent chest pain troponins are stable EKG changes are chronic.  Chronic kidney disease stage IV: It is better than baseline, question if we need to restart diuretic therapy.  Not sure his  blood pressure will hold as is borderline and he was just restarted on his Coreg.  Will discuss with nephrology.  Hyperglycemia/diabetes mellitus type 2: No prior history of diabetes, HcA1c of 7.1, will continue sliding scale insulin.  History of ischemic cardiomyopathy: He appears euvolemic continue to hold Lasix blood pressure slightly low since starting Coreg will discuss with cardiology, see if we get placed him on a selective beta-blocker instead of Coreg.  To allow better renal perfusion and MAP greater than 65.  Leukocytosis: He has remained afebrile, likely due to ischemic limb  Severe protein caloric malnutrition:  DVT prophylaxis: lovenxo Family Communication:none Status is: Inpatient  Remains inpatient appropriate because:Hemodynamically unstable   Dispo: The patient is from: Home              Anticipated d/c is to: SNF              Anticipated d/c date is: 5 days              Patient currently is not medically stable to d/c.  Once amputation has been performed.        Code Status:     Code Status Orders  (From admission, onward)           Start     Ordered   10/31/20 0235  Limited resuscitation (code)  Continuous       Question Answer Comment  In the event of cardiac or respiratory ARREST: Initiate Code Blue, Call Rapid Response Yes   In the event of cardiac or respiratory ARREST: Perform CPR Yes   In the event of cardiac or respiratory ARREST: Perform Intubation/Mechanical Ventilation No  In the event of cardiac or respiratory ARREST: Use NIPPV/BiPAp only if indicated Yes   In the event of cardiac or respiratory ARREST: Administer ACLS medications if indicated Yes   In the event of cardiac or respiratory ARREST: Perform Defibrillation or Cardioversion if indicated Yes      10/31/20 0234           Code Status History     Date Active Date Inactive Code Status Order ID Comments User Context   10/16/2020 1912 10/18/2020 2008 Full Code [id number]   Nigel Mormon, MD Inpatient   08/02/2020 0035 08/04/2020 1745 Full Code [id number]  Nigel Mormon, MD Inpatient   Advance Care Planning Activity      Advance Directive Documentation      Most Recent Value  Type of Advance Directive Living will, Healthcare Power of Attorney  Pre-existing out of facility DNR order (yellow form or pink MOST form) --  "MOST" Form in Place? --         IV Access:    Peripheral IV   Procedures and diagnostic studies:   No results found.   Medical Consultants:    None.  Anti-Infectives:   none  Subjective:    Mike [name] relates his pain is a little bit better controlled.  Objective:    Vitals:   11/04/20 0400 11/04/20 0412 11/04/20 0444 11/04/20 0731  BP:  (!) 96/51  (!) 85/54  Pulse:  75  72  Resp: 16 20 15 18   Temp:  98 F (36.7 C)  (!) 97.5 F (36.4 C)  TempSrc:  Oral  Oral  SpO2: 99% 99% 99% 98%  Weight:  67 kg    Height:       SpO2: 98 % O2 Flow Rate (L/min): 3 L/min   Intake/Output Summary (Last 24 hours) at 11/04/2020 0834 Last data filed at 11/04/2020 0600 Gross per 24 hour  Intake 480 ml  Output 750 ml  Net -270 ml   Filed Weights   11/02/20 0433 11/03/20 0035 11/04/20 0412  Weight: 66.3 kg 67.1 kg 67 kg    Exam: General exam: In no acute distress. Respiratory system: Good air movement and clear to auscultation. Cardiovascular system: S1 & S2 heard, RRR. No JVD. Gastrointestinal system: Abdomen is nondistended, soft and nontender.  Skin: Left lower extremity is slightly cold  Data Reviewed:    Labs: Basic Metabolic Panel: Recent Labs  Lab 10/30/20 1652 10/30/20 1652 10/31/20 0429 10/31/20 0955 10/31/20 0955 11/01/20 0928 11/01/20 0928 11/02/20 0151 11/03/20 0358  NA 136  --   --  132*  --  134*  --  133* 134*  K 4.1   < >  --  3.5   < > 3.6   < > 3.1* 4.5  CL 102  --   --  103  --  103  --  103 107  CO2 11*  --   --  11*  --  13*  --  15* 15*  GLUCOSE 205*  --   --   111*  --  119*  --  96 116*  BUN 104*  --   --  113*  --  111*  --  110* 110*  CREATININE 4.62*  --   --  4.26*  --  3.75*  --  3.45* 3.06*  CALCIUM 9.3  --   --  8.9  --  8.9  --  8.8* 9.2  MG  --   --  2.4  --   --   --   --   --   --  PHOS  --   --  6.5*  --   --  6.2*  --   --   --    < > = values in this interval not displayed.   GFR Estimated Creatinine Clearance: 24 mL/min (A) (by C-G formula based on SCr of 3.06 mg/dL (H)). Liver Function Tests: Recent Labs  Lab 10/31/20 0429  AST 235*  ALT 123*  ALKPHOS 81  BILITOT 0.9  PROT 7.2  ALBUMIN 2.5*   No results for input(s): LIPASE, AMYLASE in the last 168 hours. No results for input(s): AMMONIA in the last 168 hours. Coagulation profile Recent Labs  Lab 10/30/20 1822  INR 1.2   COVID-19 Labs  No results for input(s): DDIMER, FERRITIN, LDH, CRP in the last 72 hours.  Lab Results  Component Value Date   SARSCOV2NAA NEGATIVE 10/30/2020   [place] NEGATIVE 10/12/2020   [place] NEGATIVE 08/02/2020    CBC: Recent Labs  Lab 10/30/20 1652 10/31/20 0429 11/01/20 0928 11/02/20 0151 11/03/20 0358  WBC 21.9* 19.5* 18.2* 16.3* 15.7*  NEUTROABS  --  17.0* 16.1* 14.1* 13.9*  HGB 12.2* 11.4* 11.4* 10.4* 10.5*  HCT 37.3* 34.6* 34.0* 31.1* 31.9*  MCV 85.4 83.8 85.2 83.4 86.2  PLT 379 296 288 242 248   Cardiac Enzymes: No results for input(s): CKTOTAL, CKMB, CKMBINDEX, TROPONINI in the last 168 hours. BNP (last 3 results) Recent Labs    09/25/20 0904 10/12/20 0812  PROBNP 2,404* 1,642*   CBG: Recent Labs  Lab 11/03/20 1556 11/03/20 2028 11/04/20 0016 11/04/20 0408 11/04/20 0726  GLUCAP 170* 75 188* 93 123*   D-Dimer: No results for input(s): DDIMER in the last 72 hours. Hgb A1c: No results for input(s): HGBA1C in the last 72 hours. Lipid Profile: No results for input(s): CHOL, HDL, LDLCALC, TRIG, CHOLHDL, LDLDIRECT in the last 72 hours. Thyroid function studies: No results for input(s): TSH,  T4TOTAL, T3FREE, THYROIDAB in the last 72 hours.  Invalid input(s): FREET3 Anemia work up: No results for input(s): VITAMINB12, FOLATE, FERRITIN, TIBC, IRON, RETICCTPCT in the last 72 hours. Sepsis Labs: Recent Labs  Lab 10/30/20 1652 10/30/20 2111 10/30/20 2141 10/31/20 0133 10/31/20 0429 11/01/20 0928 11/02/20 0151 11/03/20 0358  PROCALCITON  --  1.19  --   --   --   --   --   --   WBC   < >  --   --   --  19.5* 18.2* 16.3* 15.7*  LATICACIDVEN  --   --  1.5 1.3  --   --   --   --    < > = values in this interval not displayed.   Microbiology Recent Results (from the past 240 hour(s))  Respiratory Panel by RT PCR (Flu A&B, Covid) - Nasopharyngeal Swab     Status: None   Collection Time: 10/30/20  7:51 PM   Specimen: Nasopharyngeal Swab  Result Value Ref Range Status   SARS Coronavirus 2 by RT PCR NEGATIVE NEGATIVE Final    Comment: (NOTE) SARS-CoV-2 target nucleic acids are NOT DETECTED.  The SARS-CoV-2 RNA is generally detectable in upper respiratoy specimens during the acute phase of infection. The lowest concentration of SARS-CoV-2 viral copies this assay can detect is 131 copies/mL. A negative result does not preclude SARS-Cov-2 infection and should not be used as the sole basis for treatment or other patient management decisions. A negative result may occur with  improper specimen collection/handling, submission of specimen other than nasopharyngeal swab, presence of viral mutation(s)  within the areas targeted by this assay, and inadequate number of viral copies (<131 copies/mL). A negative result must be combined with clinical observations, patient history, and epidemiological information. The expected result is Negative.  Fact Sheet for Patients:  PinkCheek.be  Fact Sheet for Healthcare Providers:  GravelBags.it  This test is no t yet approved or cleared by the [place] FDA and  has been  authorized for detection and/or diagnosis of SARS-CoV-2 by FDA under an Emergency Use Authorization (EUA). This EUA will remain  in effect (meaning this test can be used) for the duration of the COVID-19 declaration under Section 564(b)(1) of the Act, 21 U.S.C. section 360bbb-3(b)(1), unless the authorization is terminated or revoked sooner.     Influenza A by PCR NEGATIVE NEGATIVE Final   Influenza B by PCR NEGATIVE NEGATIVE Final    Comment: (NOTE) The Xpert Xpress SARS-CoV-2/FLU/RSV assay is intended as an aid in  the diagnosis of influenza from Nasopharyngeal swab specimens and  should not be used as a sole basis for treatment. Nasal washings and  aspirates are unacceptable for Xpert Xpress SARS-CoV-2/FLU/RSV  testing.  Fact Sheet for Patients: PinkCheek.be  Fact Sheet for Healthcare Providers: GravelBags.it  This test is not yet approved or cleared by the [place] FDA and  has been authorized for detection and/or diagnosis of SARS-CoV-2 by  FDA under an Emergency Use Authorization (EUA). This EUA will remain  in effect (meaning this test can be used) for the duration of the  Covid-19 declaration under Section 564(b)(1) of the Act, 21  U.S.C. section 360bbb-3(b)(1), unless the authorization is  terminated or revoked. Performed at [facility], [place] [address] [place], [place] [address]   Culture, blood (x 2)     Status: None (Preliminary result)   Collection Time: 10/30/20  9:41 PM   Specimen: BLOOD  Result Value Ref Range Status   Specimen Description BLOOD RIGHT ANTECUBITAL  Final   Special Requests   Final    BOTTLES DRAWN AEROBIC AND ANAEROBIC Blood Culture adequate volume   Culture   Final    NO GROWTH 4 DAYS Performed at [facility], [place] [address], [place], [place] [address]    Report Status PENDING  Incomplete  Culture, blood (x 2)     Status: None (Preliminary result)   Collection  Time: 10/31/20  1:20 AM   Specimen: BLOOD  Result Value Ref Range Status   Specimen Description BLOOD RIGHT ANTECUBITAL  Final   Special Requests   Final    BOTTLES DRAWN AEROBIC AND ANAEROBIC Blood Culture adequate volume   Culture   Final    NO GROWTH 3 DAYS Performed at [facility], [place] [address], [place], [place] [address]    Report Status PENDING  Incomplete     Medications:   . aspirin EC  81 mg Oral Daily  . carvedilol  1.56 mg Oral BID WC  . clopidogrel  75 mg Oral Daily  . feeding supplement  237 mL Oral BID BM  . fentaNYL   Intravenous Q4H  . Gerhardt's butt cream   Topical BID  . heparin  5,000 Units Subcutaneous Q8H  . insulin aspart  0-6 Units Subcutaneous Q4H  . ivabradine  2.5 mg Oral BID WC  . multivitamin with minerals  1 tablet Oral Daily  . nicotine  7 mg Transdermal Q24H  . oxyCODONE  15 mg Oral Q12H  . sodium bicarbonate  1,300 mg Oral BID  Continuous Infusions: . sodium chloride 10 mL/hr at 11/02/20 2000      LOS: 5 days   Charlynne Cousins  [facility]  11/04/2020, 8:34 AM

## 2020-11-04 NOTE — Progress Notes (Signed)
Nephrology Follow-Up Consult note   Assessment/Recommendations: Mike [name] is a/an 59 y.o. male with a past medical history significant for CAD s/p STEMI, PAD w/ h/po critical limb ischemia, hld, renal artery stenosis, systolic heart failure related to ischemic cardiomyopathy, elevated Creatinine who present w/ critical limb ischemia  Elevated Creatinine, likely AKI on CKD.:  Significant change in creatinine since August when he had a STEMI.  Creatinine was 0.5 at that time and rose to 1.1 prior to discharge likely demonstrating about a 40% loss in GFR. Now with significant rise in Crt last month and no definitive baseline.  It is difficult to say how much of his creatinine elevation is related to CKD and how much is AKI given he has not had stable renal function over an extended period of time.  Thinks he was taking ibuprofen which may have contributed to some AKI.  Creatinine improved yesterday.  Labs pending today -Continue to monitor creatinine daily.  Volume appears optimized at this time -Could consider the use of Midodrin to improve MAP but the increased afterload could exacerbate his heart failure -Monitor Daily I/Os, Daily weight  -Avoid nephrotoxic medications including NSAIDs and Vanc/Zosyn combo -Currently no indication for HD -Agree with palliative care involvement but patient indicates he would want hemodialysis if necessary  CHF: Cardiology managing.  Started low-dose Coreg today.  Would hold this medication the morning of surgery.  Perioperative renal assessment: Please refer to perioperative assessment and original consult note on 11/5.  Critical Limb Ischemia: cardiology and proceduralist involved.  It appears plans are for amputation.  Hypokalemia: continue oral replacement PRN.  Labs pending today  Non-anion gap metabolic acidosis: Likely associated with kidney disease.  Started on oral bicarbonate 1300 mg twice daily yesterday.  Goal serum bicarbonate  22-24  COPD: Continue supplemental oxygen and inhalers as needed.  If creatinine is stable or improved today we will likely sign off for now.  Recommendations for perioperative planning for kidneys included in original note.  If further questions arise we are happy to be involved again.  Recommendations conveyed to primary service.    [facility] 11/04/2020 8:11 AM  ___________________________________________________________  CC: AKI on CKD  Interval History/Subjective: Seen resting today without any complaints   Medications:  Current Facility-Administered Medications  Medication Dose Route Frequency Provider Last Rate Last Admin  . 0.9 %  sodium chloride infusion   Intravenous PRN Charlynne Cousins, MD 10 mL/hr at 11/02/20 2000 Rate Verify at 11/02/20 2000  . acetaminophen (TYLENOL) tablet 650 mg  650 mg Oral Q6H PRN Toy Baker, MD       Or  . acetaminophen (TYLENOL) suppository 650 mg  650 mg Rectal Q6H PRN Doutova, Anastassia, MD      . aspirin EC tablet 81 mg  81 mg Oral Daily Doutova, Anastassia, MD   81 mg at 11/03/20 0813  . carvedilol (COREG) tablet 1.56 mg  1.56 mg Oral BID WC Adrian Prows, MD   1.56 mg at 11/04/20 0631  . clopidogrel (PLAVIX) tablet 75 mg  75 mg Oral Daily Patwardhan, Manish J, MD   75 mg at 11/03/20 0813  . diphenhydrAMINE (BENADRYL) injection 12.5 mg  12.5 mg Intravenous Q6H PRN Charlynne Cousins, MD       Or  . diphenhydrAMINE (BENADRYL) 12.5 MG/5ML elixir 12.5 mg  12.5 mg Oral Q6H PRN Charlynne Cousins, MD      . feeding supplement (ENSURE ENLIVE / ENSURE PLUS) liquid 237 mL  237 mL Oral BID BM Charlynne Cousins, MD   237 mL at 11/03/20 1300  . fentaNYL 50 mcg/mL PCA injection   Intravenous Q4H Charlynne Cousins, MD   20 mL at 11/04/20 0444  . Gerhardt's butt cream   Topical BID Charlynne Cousins, MD   Given at 11/03/20 2204  . heparin injection 5,000 Units  5,000 Units Subcutaneous Q8H  Toy Baker, MD   5,000 Units at 11/04/20 0718  . insulin aspart (novoLOG) injection 0-6 Units  0-6 Units Subcutaneous Q4H Toy Baker, MD   1 Units at 11/04/20 0143  . ivabradine (CORLANOR) tablet 2.5 mg  2.5 mg Oral BID WC Doutova, Anastassia, MD   2.5 mg at 11/03/20 0813  . multivitamin with minerals tablet 1 tablet  1 tablet Oral Daily Charlynne Cousins, MD   1 tablet at 11/03/20 0814  . naloxone [facility]) injection 0.4 mg  0.4 mg Intravenous PRN Charlynne Cousins, MD       And  . sodium chloride flush (NS) 0.9 % injection 9 mL  9 mL Intravenous PRN Charlynne Cousins, MD      . nicotine (NICODERM CQ - dosed in mg/24 hr) patch 7 mg  7 mg Transdermal Q24H Doutova, Anastassia, MD   7 mg at 10/31/20 1032  . nitroGLYCERIN (NITROSTAT) SL tablet 0.4 mg  0.4 mg Sublingual Q5 min PRN Doutova, Anastassia, MD      . ondansetron (ZOFRAN) injection 4 mg  4 mg Intravenous Q6H PRN Charlynne Cousins, MD      . oxyCODONE (OXYCONTIN) 12 hr tablet 15 mg  15 mg Oral Q12H Charlynne Cousins, MD   15 mg at 11/03/20 2201  . sodium bicarbonate tablet 1,300 mg  1,300 mg Oral BID Reesa Chew, MD   1,300 mg at 11/03/20 2201       Physical Exam: Vitals:   11/04/20 0444 11/04/20 0731  BP:  (!) 85/54  Pulse:  72  Resp: 15 18  Temp:  (!) 97.5 F (36.4 C)  SpO2: 99% 98%   No intake/output data recorded.  Intake/Output Summary (Last 24 hours) at 11/04/2020 0811 Last data filed at 11/04/2020 0600 Gross per 24 hour  Intake 480 ml  Output 750 ml  Net -270 ml   Constitutional: chronically ill-appearing, resting ENMT: ears and nose without scars or lesions, mmm CV: normal rate, no edema Respiratory: Bilateral chest rise, normal work of breathing Gastrointestinal: soft, non-tender, no palpable masses or hernias Skin: no visible lesions or rashes   Test Results I personally reviewed new and old clinical labs and radiology tests Lab Results  Component Value Date   NA 134  (L) 11/03/2020   K 4.5 11/03/2020   CL 107 11/03/2020   CO2 15 (L) 11/03/2020   BUN 110 (H) 11/03/2020   CREATININE 3.06 (H) 11/03/2020   CALCIUM 9.2 11/03/2020   ALBUMIN 2.5 (L) 10/31/2020   PHOS 6.2 (H) 11/01/2020

## 2020-11-05 DIAGNOSIS — I5042 Chronic combined systolic (congestive) and diastolic (congestive) heart failure: Secondary | ICD-10-CM | POA: Diagnosis not present

## 2020-11-05 DIAGNOSIS — I70229 Atherosclerosis of native arteries of extremities with rest pain, unspecified extremity: Secondary | ICD-10-CM | POA: Diagnosis not present

## 2020-11-05 DIAGNOSIS — I5023 Acute on chronic systolic (congestive) heart failure: Secondary | ICD-10-CM | POA: Diagnosis not present

## 2020-11-05 DIAGNOSIS — Z9889 Other specified postprocedural states: Secondary | ICD-10-CM | POA: Diagnosis not present

## 2020-11-05 LAB — RENAL FUNCTION PANEL
Albumin: 2.4 g/dL — ABNORMAL LOW (ref 3.5–5.0)
Anion gap: 8 (ref 5–15)
BUN: 91 mg/dL — ABNORMAL HIGH (ref 8–23)
CO2: 20 mmol/L — ABNORMAL LOW (ref 22–32)
Calcium: 9 mg/dL (ref 8.9–10.3)
Chloride: 108 mmol/L (ref 98–111)
Creatinine, Ser: 2.11 mg/dL — ABNORMAL HIGH (ref 0.61–1.24)
GFR, Estimated: 35 mL/min — ABNORMAL LOW (ref >60)
Glucose, Bld: 151 mg/dL — ABNORMAL HIGH (ref 70–99)
Phosphorus: 2.9 mg/dL (ref 2.5–4.6)
Potassium: 4.5 mmol/L (ref 3.5–5.1)
Sodium: 136 mmol/L (ref 135–145)

## 2020-11-05 LAB — GLUCOSE, CAPILLARY
Glucose-Capillary: 109 mg/dL — ABNORMAL HIGH (ref 70–99)
Glucose-Capillary: 115 mg/dL — ABNORMAL HIGH (ref 70–99)
Glucose-Capillary: 119 mg/dL — ABNORMAL HIGH (ref 70–99)
Glucose-Capillary: 127 mg/dL — ABNORMAL HIGH (ref 70–99)
Glucose-Capillary: 129 mg/dL — ABNORMAL HIGH (ref 70–99)
Glucose-Capillary: 161 mg/dL — ABNORMAL HIGH (ref 70–99)

## 2020-11-05 LAB — CULTURE, BLOOD (ROUTINE X 2)
Culture: NO GROWTH
Special Requests: ADEQUATE

## 2020-11-05 MED ORDER — MUPIROCIN 2 % EX OINT
1.0000 "application " | TOPICAL_OINTMENT | Freq: Two times a day (BID) | CUTANEOUS | Status: DC
  Administered 2020-11-05 – 2020-11-07 (×3): 1 via NASAL
  Filled 2020-11-05 (×2): qty 22

## 2020-11-05 MED ORDER — CARVEDILOL 3.125 MG PO TABS
3.1250 mg | ORAL_TABLET | Freq: Two times a day (BID) | ORAL | Status: DC
  Administered 2020-11-05 – 2020-11-09 (×7): 3.125 mg via ORAL
  Filled 2020-11-05 (×8): qty 1

## 2020-11-05 NOTE — Progress Notes (Signed)
[facility] PROGRESS NOTE    Progress Note  Mike [name]  OJJ:[id number] DOB: February 04, 1961 DOA: 10/30/2020 PCP: Patient, No Pcp Per     Brief Narrative:   Mike [name] is an 59 y.o. male past medical history significant for STEMI in 08/17/2020 with moderate MR cardiac arrest secondary to torsade post PCI, he has no known resolve ST segment elevation in the inferior leads since his STEMI in 08/17/2020, critical limb ischemia with right iliac revascularization with residual limb ischemia in the left lower extremity, severe bilateral renal artery stenosis, acute kidney injury following arteriogram on 10/16/2020 is into the hospital complaining of shortness of breath and inability to walk due to severe leg pain.  Cardiology was consulted and unfortunately he has an extremely high risk of limb loss and in view of his underlying chronic kidney disease stage IV chronic systolic heart failure that is due to ischemic cardiomyopathy he has an extremely high risk of mortality.  Urology recommended to consult   Assessment/Plan:   Critical limb ischemia with pain at rest on the left lower extremity with no revascularization at this point, vascular surgery consult was obtained on 10/17/2020: Vascular surgery was consulted and they recommended above-the-knee amputation, he was changed to aspirin and Plavix.   For above-the-knee amputation on 11/06/2020.  Continue long-acting oxy and fentanyl IV PCA pump.  Ongoing tobacco abuse: The admitting physician spoke to him and at this point in time he is interested in quitting and nicotine patch was ordered.  Hyperlipidemia: Continue statins.  Coronary artery disease: Continue statins, beta-blockers, aspirin and Plavix. Tolerating his Coreg.  Chronic kidney disease stage IV: It is better than baseline,, this morning 2.1.  Are relatively stable continue to hold Lasix.  He is tolerating his Coreg.  Hyperglycemia/diabetes mellitus type 2: Fairly  well controlled continue sliding scale insulin  History of ischemic cardiomyopathy: He appears euvolemic continue to hold Lasix blood pressure slightly low since starting Coreg will discuss with cardiology, see if we get placed him on a selective beta-blocker instead of Coreg.  To allow better renal perfusion and MAP greater than 65.  Leukocytosis: He has remained afebrile, likely due to ischemic limb  Severe protein caloric malnutrition:  DVT prophylaxis: lovenxo Family Communication:none Status is: Inpatient  Remains inpatient appropriate because:Hemodynamically unstable   Dispo: The patient is from: Home              Anticipated d/c is to: SNF              Anticipated d/c date is: 3 days              Patient currently is not medically stable to d/c.  Once amputation has been performed.        Code Status:     Code Status Orders  (From admission, onward)           Start     Ordered   10/31/20 0235  Limited resuscitation (code)  Continuous       Question Answer Comment  In the event of cardiac or respiratory ARREST: Initiate Code Blue, Call Rapid Response Yes   In the event of cardiac or respiratory ARREST: Perform CPR Yes   In the event of cardiac or respiratory ARREST: Perform Intubation/Mechanical Ventilation No   In the event of cardiac or respiratory ARREST: Use NIPPV/BiPAp only if indicated Yes   In the event of cardiac or respiratory ARREST: Administer ACLS medications if indicated Yes   In the  event of cardiac or respiratory ARREST: Perform Defibrillation or Cardioversion if indicated Yes      10/31/20 0234           Code Status History     Date Active Date Inactive Code Status Order ID Comments User Context   10/16/2020 1912 10/18/2020 2008 Full Code [id number]  Nigel Mormon, MD Inpatient   08/02/2020 0035 08/04/2020 1745 Full Code [id number]  Nigel Mormon, MD Inpatient   Advance Care Planning Activity      Advance Directive  Documentation      Most Recent Value  Type of Advance Directive Living will, Healthcare Power of Attorney  Pre-existing out of facility DNR order (yellow form or pink MOST form) --  "MOST" Form in Place? --         IV Access:    Peripheral IV   Procedures and diagnostic studies:   No results found.   Medical Consultants:    None.  Anti-Infectives:   none  Subjective:    Mike [name] he relates his pain is is it was yesterday the pain is bearable but still there.  Objective:    Vitals:   11/05/20 0021 11/05/20 0400 11/05/20 0425 11/05/20 0743  BP:   (!) 96/58   Pulse:   70   Resp: 16 17 18 18   Temp:   98.3 F (36.8 C)   TempSrc:   Oral   SpO2: 98% 98%  100%  Weight:   67.7 kg   Height:       SpO2: 100 % O2 Flow Rate (L/min): 3 L/min   Intake/Output Summary (Last 24 hours) at 11/05/2020 0921 Last data filed at 11/05/2020 0600 Gross per 24 hour  Intake 720 ml  Output 1050 ml  Net -330 ml   Filed Weights   11/03/20 0035 11/04/20 0412 11/05/20 0425  Weight: 67.1 kg 67 kg 67.7 kg    Exam: General exam: In no acute distress. Respiratory system: Good air movement and clear to auscultation. Cardiovascular system: S1 & S2 heard, RRR. No JVD. Gastrointestinal system: Abdomen is nondistended, soft and nontender.  Central nervous system: Alert and oriented. No focal neurological deficits. Extremities: The left lower extremity feels cold.  Data Reviewed:    Labs: Basic Metabolic Panel: Recent Labs  Lab 10/31/20 0429 10/31/20 0955 11/01/20 0928 11/01/20 0928 11/02/20 0151 11/02/20 0151 11/03/20 0358 11/03/20 0358 11/04/20 1440 11/05/20 0555  NA  --    < > 134*  --  133*  --  134*  --  134* 136  K  --    < > 3.6   < > 3.1*   < > 4.5   < > 4.8 4.5  CL  --    < > 103  --  103  --  107  --  106 108  CO2  --    < > 13*  --  15*  --  15*  --  15* 20*  GLUCOSE  --    < > 119*  --  96  --  116*  --  102* 151*  BUN  --    < > 111*  --  110*   --  110*  --  96* 91*  CREATININE  --    < > 3.75*  --  3.45*  --  3.06*  --  2.25* 2.11*  CALCIUM  --    < > 8.9  --  8.8*  --  9.2  --  9.2 9.0  MG 2.4  --   --   --   --   --   --   --   --   --   PHOS 6.5*  --  6.2*  --   --   --   --   --  3.5 2.9   < > = values in this interval not displayed.   GFR Estimated Creatinine Clearance: 35.2 mL/min (A) (by C-G formula based on SCr of 2.11 mg/dL (H)). Liver Function Tests: Recent Labs  Lab 10/31/20 0429 11/04/20 1440 11/05/20 0555  AST 235*  --   --   ALT 123*  --   --   ALKPHOS 81  --   --   BILITOT 0.9  --   --   PROT 7.2  --   --   ALBUMIN 2.5* 2.6* 2.4*   No results for input(s): LIPASE, AMYLASE in the last 168 hours. No results for input(s): AMMONIA in the last 168 hours. Coagulation profile Recent Labs  Lab 10/30/20 1822  INR 1.2   COVID-19 Labs  No results for input(s): DDIMER, FERRITIN, LDH, CRP in the last 72 hours.  Lab Results  Component Value Date   SARSCOV2NAA NEGATIVE 10/30/2020   [place] NEGATIVE 10/12/2020   [place] NEGATIVE 08/02/2020    CBC: Recent Labs  Lab 10/30/20 1652 10/31/20 0429 11/01/20 0928 11/02/20 0151 11/03/20 0358  WBC 21.9* 19.5* 18.2* 16.3* 15.7*  NEUTROABS  --  17.0* 16.1* 14.1* 13.9*  HGB 12.2* 11.4* 11.4* 10.4* 10.5*  HCT 37.3* 34.6* 34.0* 31.1* 31.9*  MCV 85.4 83.8 85.2 83.4 86.2  PLT 379 296 288 242 248   Cardiac Enzymes: No results for input(s): CKTOTAL, CKMB, CKMBINDEX, TROPONINI in the last 168 hours. BNP (last 3 results) Recent Labs    09/25/20 0904 10/12/20 0812  PROBNP 2,404* 1,642*   CBG: Recent Labs  Lab 11/04/20 1609 11/04/20 2048 11/05/20 0033 11/05/20 0407 11/05/20 0833  GLUCAP 181* 134* 115* 119* 129*   D-Dimer: No results for input(s): DDIMER in the last 72 hours. Hgb A1c: No results for input(s): HGBA1C in the last 72 hours. Lipid Profile: No results for input(s): CHOL, HDL, LDLCALC, TRIG, CHOLHDL, LDLDIRECT in the last 72  hours. Thyroid function studies: No results for input(s): TSH, T4TOTAL, T3FREE, THYROIDAB in the last 72 hours.  Invalid input(s): FREET3 Anemia work up: No results for input(s): VITAMINB12, FOLATE, FERRITIN, TIBC, IRON, RETICCTPCT in the last 72 hours. Sepsis Labs: Recent Labs  Lab 10/30/20 1652 10/30/20 2111 10/30/20 2141 10/31/20 0133 10/31/20 0429 11/01/20 0928 11/02/20 0151 11/03/20 0358  PROCALCITON  --  1.19  --   --   --   --   --   --   WBC   < >  --   --   --  19.5* 18.2* 16.3* 15.7*  LATICACIDVEN  --   --  1.5 1.3  --   --   --   --    < > = values in this interval not displayed.   Microbiology Recent Results (from the past 240 hour(s))  Respiratory Panel by RT PCR (Flu A&B, Covid) - Nasopharyngeal Swab     Status: None   Collection Time: 10/30/20  7:51 PM   Specimen: Nasopharyngeal Swab  Result Value Ref Range Status   SARS Coronavirus 2 by RT PCR NEGATIVE NEGATIVE Final    Comment: (NOTE) SARS-CoV-2 target nucleic acids are NOT DETECTED.  The SARS-CoV-2 RNA is generally detectable in  upper respiratoy specimens during the acute phase of infection. The lowest concentration of SARS-CoV-2 viral copies this assay can detect is 131 copies/mL. A negative result does not preclude SARS-Cov-2 infection and should not be used as the sole basis for treatment or other patient management decisions. A negative result may occur with  improper specimen collection/handling, submission of specimen other than nasopharyngeal swab, presence of viral mutation(s) within the areas targeted by this assay, and inadequate number of viral copies (<131 copies/mL). A negative result must be combined with clinical observations, patient history, and epidemiological information. The expected result is Negative.  Fact Sheet for Patients:  PinkCheek.be  Fact Sheet for Healthcare Providers:  GravelBags.it  This test is no t yet  approved or cleared by the [place] FDA and  has been authorized for detection and/or diagnosis of SARS-CoV-2 by FDA under an Emergency Use Authorization (EUA). This EUA will remain  in effect (meaning this test can be used) for the duration of the COVID-19 declaration under Section 564(b)(1) of the Act, 21 U.S.C. section 360bbb-3(b)(1), unless the authorization is terminated or revoked sooner.     Influenza A by PCR NEGATIVE NEGATIVE Final   Influenza B by PCR NEGATIVE NEGATIVE Final    Comment: (NOTE) The Xpert Xpress SARS-CoV-2/FLU/RSV assay is intended as an aid in  the diagnosis of influenza from Nasopharyngeal swab specimens and  should not be used as a sole basis for treatment. Nasal washings and  aspirates are unacceptable for Xpert Xpress SARS-CoV-2/FLU/RSV  testing.  Fact Sheet for Patients: PinkCheek.be  Fact Sheet for Healthcare Providers: GravelBags.it  This test is not yet approved or cleared by the [place] FDA and  has been authorized for detection and/or diagnosis of SARS-CoV-2 by  FDA under an Emergency Use Authorization (EUA). This EUA will remain  in effect (meaning this test can be used) for the duration of the  Covid-19 declaration under Section 564(b)(1) of the Act, 21  U.S.C. section 360bbb-3(b)(1), unless the authorization is  terminated or revoked. Performed at [facility], [place] [address], [place], [place] [address]   Culture, blood (x 2)     Status: None   Collection Time: 10/30/20  9:41 PM   Specimen: BLOOD  Result Value Ref Range Status   Specimen Description BLOOD RIGHT ANTECUBITAL  Final   Special Requests   Final    BOTTLES DRAWN AEROBIC AND ANAEROBIC Blood Culture adequate volume   Culture   Final    NO GROWTH 5 DAYS Performed at [facility], [place] [address], [place], [place] [address]    Report Status 11/04/2020 FINAL  Final  Culture, blood (x 2)     Status:  None   Collection Time: 10/31/20  1:20 AM   Specimen: BLOOD  Result Value Ref Range Status   Specimen Description BLOOD RIGHT ANTECUBITAL  Final   Special Requests   Final    BOTTLES DRAWN AEROBIC AND ANAEROBIC Blood Culture adequate volume   Culture   Final    NO GROWTH 5 DAYS Performed at [facility], [place] [address], [place], [place] [address]    Report Status 11/05/2020 FINAL  Final     Medications:   . aspirin EC  81 mg Oral Daily  . carvedilol  1.56 mg Oral BID WC  . clopidogrel  75 mg Oral Daily  . feeding supplement  237 mL Oral BID BM  . fentaNYL   Intravenous Q4H  . Gerhardt's butt cream  Topical BID  . heparin  5,000 Units Subcutaneous Q8H  . insulin aspart  0-6 Units Subcutaneous Q4H  . ivabradine  2.5 mg Oral BID WC  . multivitamin with minerals  1 tablet Oral Daily  . nicotine  7 mg Transdermal Q24H  . oxyCODONE  15 mg Oral Q12H  . sodium bicarbonate  1,300 mg Oral BID   Continuous Infusions: . sodium chloride 10 mL/hr at 11/02/20 2000      LOS: 6 days   Charlynne Cousins  [facility]  11/05/2020, 9:21 AM

## 2020-11-05 NOTE — Progress Notes (Signed)
   Pre-op orders written for left above the knee amputation tomorrow 11/06/20 Keep NPO past midnight Consent ordered   Dagoberto Ligas, PA-C [facility] [phone] 11/05/2020  8:05 AM

## 2020-11-05 NOTE — H&P (View-Only) (Signed)
    Subjective  -   Complaining of severe rest pain in left leg   Physical Exam:  Left leg is mottled up to the knee with ulceration on the toe Nonpalpable femoral pulse Nonlabored breathing    Assessment/Plan:    Ischemic left leg: The patient's sister is at bedside.  He has severe rest pain in his left leg without options for revascularization given his underlying cardiac status.  I think his best option at this time is a left above-knee amputation which will be performed tomorrow.  He has been transitioned off of Brilinta onto aspirin and Plavix for his MI with PCI 2 months ago.  He is obviously at high risk for surgery and so I will try to have anesthesia do this under a regional block.  All questions were answered.  He will be n.p.o. after midnight.  Mike [name] 11/05/2020 8:23 AM --  Vitals:   11/05/20 0425 11/05/20 0743  BP: (!) 96/58   Pulse: 70   Resp: 18 18  Temp: 98.3 F (36.8 C)   SpO2:  100%    Intake/Output Summary (Last 24 hours) at 11/05/2020 0823 Last data filed at 11/05/2020 0600 Gross per 24 hour  Intake 960 ml  Output 1050 ml  Net -90 ml     Laboratory CBC    Component Value Date/Time   WBC 15.7 (H) 11/03/2020 0358   HGB 10.5 (L) 11/03/2020 0358   HCT 31.9 (L) 11/03/2020 0358   PLT 248 11/03/2020 0358    BMET    Component Value Date/Time   NA 136 11/05/2020 0555   NA 135 10/26/2020 1004   K 4.5 11/05/2020 0555   CL 108 11/05/2020 0555   CO2 20 (L) 11/05/2020 0555   GLUCOSE 151 (H) 11/05/2020 0555   BUN 91 (H) 11/05/2020 0555   BUN 79 (HH) 10/26/2020 1004   CREATININE 2.11 (H) 11/05/2020 0555   CALCIUM 9.0 11/05/2020 0555   GFRNONAA 35 (L) 11/05/2020 0555   GFRAA 12 (L) 10/26/2020 1004    COAG Lab Results  Component Value Date   INR 1.2 10/30/2020   No results found for: PTT  Antibiotics Anti-infectives (From admission, onward)   None       V. Leia Alf, M.D., [facility]  [place] Office: [phone] Pager:  [phone]

## 2020-11-05 NOTE — Progress Notes (Signed)
Patient is alert and oriented x 4. Patient verbalizes that he has no questions about the procedure that will take place tomorrow. Patient signed inform consent is located in his chart.

## 2020-11-05 NOTE — Progress Notes (Signed)
Mike [name] is a 59 y.o. Caucasian male with CAD, culprit (RCA) and nonculprit (LCx) PCI after STEMI 07/2020, HFrEF, moderate MR, h/o cardiac arrest 2/2 Torsades post PPCI during index hospitalization, PAD with critical limb ischemia, underwent angioplasty to right iliac artery on 10/16/2020 still has residual occluded left iliac artery and long ostially occluded SFA and has a gangrene in his left second toe and critical limb ischemia.  Felt no surgical revascularization options, now admitted to the hospital with severe pain and presently on fentanyl pump.  He is evaluated by vascular surgery and plans on left above-knee amputation.  Subjective:  Still having a lot of pain in his right leg. No chest pain, he has not had any dyspnea, PND or orthopnea.  No fever or chills.  Intake/Output from previous day:  I/O last 3 completed shifts: In: 1440 [P.O.:1440] Out: 2774 [Urine:1350] No intake/output data recorded.  Blood pressure (!) 99/50, pulse 70, temperature 97.7 F (36.5 C), temperature source Oral, resp. rate 18, height 6' (1.829 m), weight 67.7 kg, SpO2 100 %.  Vitals with BMI 11/05/2020 11/05/2020 11/04/2020  Height - - -  Weight - 149 lbs 5 oz -  BMI - 12.87 -  Systolic 99 96 99  Diastolic 50 58 48  Pulse 70 70 71    Physical Exam Cardiovascular:     Rate and Rhythm: Normal rate and regular rhythm.     Pulses:          Popliteal pulses are 0 on the right side and 0 on the left side.       Dorsalis pedis pulses are 0 on the right side and 0 on the left side.       Posterior tibial pulses are 0 on the right side and 0 on the left side.     Heart sounds: Normal heart sounds. No murmur heard.  No gallop.      Comments: No leg edema, no JVD. Left second toe dry gangrene noted. Chronic ischemic changes involving bilateral lower extremity.  Pulmonary:     Effort: Pulmonary effort is normal.     Breath sounds: Normal breath sounds.  Abdominal:     General: Bowel sounds are normal.      Palpations: Abdomen is soft.   Lab Results: BNP (last 3 results) Recent Labs    08/03/20 0644 10/22/20 1054 10/30/20 1820  BNP 751.4* 261.5* 488.4*   ProBNP (last 3 results) Recent Labs    09/25/20 0904 10/12/20 0812  PROBNP 2,404* 1,642*   BMP Latest Ref Rng & Units 11/05/2020 11/04/2020 11/03/2020  Glucose 70 - 99 mg/dL 151(H) 102(H) 116(H)  BUN 8 - 23 mg/dL 91(H) 96(H) 110(H)  Creatinine 0.61 - 1.24 mg/dL 2.11(H) 2.25(H) 3.06(H)  BUN/Creat Ratio 10 - 24 - - -  Sodium 135 - 145 mmol/L 136 134(L) 134(L)  Potassium 3.5 - 5.1 mmol/L 4.5 4.8 4.5  Chloride 98 - 111 mmol/L 108 106 107  CO2 22 - 32 mmol/L 20(L) 15(L) 15(L)  Calcium 8.9 - 10.3 mg/dL 9.0 9.2 9.2   Hepatic Function Latest Ref Rng & Units 11/05/2020 11/04/2020 10/31/2020  Total Protein 6.5 - 8.1 g/dL - - 7.2  Albumin 3.5 - 5.0 g/dL 2.4(L) 2.6(L) 2.5(L)  AST 15 - 41 U/L - - 235(H)  ALT 0 - 44 U/L - - 123(H)  Alk Phosphatase 38 - 126 U/L - - 81  Total Bilirubin 0.3 - 1.2 mg/dL - - 0.9  Bilirubin, Direct 0.0 - 0.2 mg/dL - -  0.2   CBC Latest Ref Rng & Units 11/03/2020 11/02/2020 11/01/2020  WBC 4.0 - 10.5 K/uL 15.7(H) 16.3(H) 18.2(H)  Hemoglobin 13.0 - 17.0 g/dL 10.5(L) 10.4(L) 11.4(L)  Hematocrit 39 - 52 % 31.9(L) 31.1(L) 34.0(L)  Platelets 150 - 400 K/uL 248 242 288   Lipid Panel     Component Value Date/Time   CHOL 95 (L) 09/11/2020 0806   TRIG 76 09/11/2020 0806   HDL 30 (L) 09/11/2020 0806   CHOLHDL 3.2 09/11/2020 0806   CHOLHDL 4.5 08/02/2020 0707   VLDL 15 08/02/2020 0707   LDLCALC 49 09/11/2020 0806   Cardiac Panel (last 3 results) No results for input(s): CKTOTAL, CKMB, TROPONINI, RELINDX in the last 72 hours.  HEMOGLOBIN A1C Lab Results  Component Value Date   HGBA1C 7.1 (H) 10/31/2020   MPG 157.07 10/31/2020   TSH Recent Labs    10/31/20 0429  TSH 10.137*   Imaging: No results found.  Cardiac Studies:  Coronary intervention 08/01/2020: LM: Distal 30% stenosis LAD:Mid 30%  disease LCx: Subtotally occluded OM2, bridging left-to-left and right-to-left collaterals RCA: Prox 30% stenosis. Mid 100% occlusion Successful percutaneous coronary intervention mid RCA PTCA and stent placement 2.5 X 26 mm Resolute Onyx drug-eluting stent  Abdominal Aortic Duplex 09/21/2020:  Moderate plaque noted in the proximal, mid and distal aorta. There is an  ulcerated plaque noted in the distal abdominal aorta. No AAA. Normal iliac  artery velocity.  Abdominal Aortic Duplex 09/21/2020:  Moderate plaque noted in the proximal, mid and distal aorta. There is an  ulcerated plaque noted in the distal abdominal aorta. No AAA. Normal iliac  artery velocity.  Lower Extremity Arterial Duplex 09/21/2020:  Theright SFA is occluded in the proximal segment with reconstitution at  the level of the popliteal artery with diffuse monophasic waveform below  the knee. Right profunda femoral artery has >50% stenosis. There is  moderate mixed plaque noted throughout the right lower extremity.  Monophasic waveform throughout the left lower extremity, indicates  significant proximal disease (iliac artery).  Left SFA is occluded in the proximal segment and reconstitutes just above popliteal artery and diffuse dampened monophasic waveform throughout the lower extremity below the  knee. There is moderate mixed plaque throughout the left lower extremity.   This exam reveals severely decreased perfusion of the right lower  extremity, noted at the dorsalis pedis artery level (ABI 0.42) and  critically decreased perfusion of the left lower extremity, noted at the  dorsalis pedis and post tibial artery level (ABI 0.03).   Echocardiogram 09/11/2020:  Left ventricle cavity is moderately dilated. Moderate concentric  hypertrophy of the left ventricle. Moderate global and severe  inferolateral hypokinesis. LVEF 15-20%. Grade 1 diastolic dysfunction.  Normal left atrial pressure.  Calculated EF 15%.  Mild to moderate mitral regurgitation.  Inadequate TR jet to estimate pulmonary artery systolic pressure. Normal  right atrial pressure.  Compared to previous study in 03/2020, LVEF is reduced from 30-35%. Mitral  regurgitation is marginally improved.   PV intervention 10/16/2020: Successful intravascular lithotripsy, PTCA and stenting 7.0X39 mm balloon expandable Viabahn VBX stent Rt common iliac artery  7.0X60 mm self expanding Absolute Pro stent Rt distal iliac artery No named vessels at ankle level in both lower extremities.  Will consult vascular surgery for Rt fem-to-left profunda bypass given left foot critical limb ischemia.  EKG11/01/2020: Sinus rhythm 73 bpm Nonspecific ST elevation inferior leads.   Scheduled Meds: . aspirin EC  81 mg Oral Daily  . carvedilol  3.125  mg Oral BID WC  . clopidogrel  75 mg Oral Daily  . feeding supplement  237 mL Oral BID BM  . fentaNYL   Intravenous Q4H  . Gerhardt's butt cream   Topical BID  . heparin  5,000 Units Subcutaneous Q8H  . insulin aspart  0-6 Units Subcutaneous Q4H  . ivabradine  2.5 mg Oral BID WC  . multivitamin with minerals  1 tablet Oral Daily  . nicotine  7 mg Transdermal Q24H  . oxyCODONE  15 mg Oral Q12H  . sodium bicarbonate  1,300 mg Oral BID   Continuous Infusions: . sodium chloride 10 mL/hr at 11/02/20 2000   PRN Meds:.sodium chloride, acetaminophen **OR** acetaminophen, diphenhydrAMINE **OR** diphenhydrAMINE, naloxone **AND** sodium chloride flush, nitroGLYCERIN, ondansetron (ZOFRAN) IV  Assessment/Plan:  1.  Ischemic cardiomyopathy with severe LV systolic dysfunction 2.  Chronic systolic heart failure 3.  Severe peripheral arterial disease with gangrene involving left leg and critical limb ischemia right leg with revascularization of right lower extremity but no revascularization options for the left leg, plan on above-knee amputation due to gangrene and intolerable pain and rest  pain. 4.  Acute renal failure following peripheral arteriogram, serum creatinine improving, stage III chronic kidney disease presently.  Recommendation: Patient's blood pressure is very soft however he is tolerating carvedilol at 1.5 mg dose, will increase it to 3.125 milligrams twice daily.  Continue Corlanor.  Renal function is improving, no has improved to stage IIIb from stage IV.  There is no clinical evidence of heart failure.  Continue present medication, he is in the best shape that I could imagine for his amputation tomorrow.  Dr. Annamarie Major has already evaluated the patient.   Adrian Prows, MD, [facility] 11/05/2020, 10:52 AM Office: [phone] Pager: [phone]

## 2020-11-05 NOTE — Progress Notes (Signed)
    Subjective  -   Complaining of severe rest pain in left leg   Physical Exam:  Left leg is mottled up to the knee with ulceration on the toe Nonpalpable femoral pulse Nonlabored breathing    Assessment/Plan:    Ischemic left leg: The patient's sister is at bedside.  He has severe rest pain in his left leg without options for revascularization given his underlying cardiac status.  I think his best option at this time is a left above-knee amputation which will be performed tomorrow.  He has been transitioned off of Brilinta onto aspirin and Plavix for his MI with PCI 2 months ago.  He is obviously at high risk for surgery and so I will try to have anesthesia do this under a regional block.  All questions were answered.  He will be n.p.o. after midnight.  Mike [name] 11/05/2020 8:23 AM --  Vitals:   11/05/20 0425 11/05/20 0743  BP: (!) 96/58   Pulse: 70   Resp: 18 18  Temp: 98.3 F (36.8 C)   SpO2:  100%    Intake/Output Summary (Last 24 hours) at 11/05/2020 0823 Last data filed at 11/05/2020 0600 Gross per 24 hour  Intake 960 ml  Output 1050 ml  Net -90 ml     Laboratory CBC    Component Value Date/Time   WBC 15.7 (H) 11/03/2020 0358   HGB 10.5 (L) 11/03/2020 0358   HCT 31.9 (L) 11/03/2020 0358   PLT 248 11/03/2020 0358    BMET    Component Value Date/Time   NA 136 11/05/2020 0555   NA 135 10/26/2020 1004   K 4.5 11/05/2020 0555   CL 108 11/05/2020 0555   CO2 20 (L) 11/05/2020 0555   GLUCOSE 151 (H) 11/05/2020 0555   BUN 91 (H) 11/05/2020 0555   BUN 79 (HH) 10/26/2020 1004   CREATININE 2.11 (H) 11/05/2020 0555   CALCIUM 9.0 11/05/2020 0555   GFRNONAA 35 (L) 11/05/2020 0555   GFRAA 12 (L) 10/26/2020 1004    COAG Lab Results  Component Value Date   INR 1.2 10/30/2020   No results found for: PTT  Antibiotics Anti-infectives (From admission, onward)   None       V. Leia Alf, M.D., [facility]  [place] Office: [phone] Pager:  [phone]

## 2020-11-05 NOTE — Progress Notes (Signed)
Physical Therapy Treatment Patient Details Name: Mike [name] MRN: [id number] DOB: Nov 28, 1961 Today's Date: 11/05/2020    History of Present Illness Pt is a 59 year old man admitted on 10/30/20 with SOB and inability to walk with severe L LE pain. Pt with critical L LE ischemia. PMH: STEMI 07/2020 with moderate MR due to torsade post PCI, L LE ischemia with R iliac revascularization, CKD IV, CHF, DM.     PT Comments    Pt is obviously distracted by upcoming surgery tomorrow. Pt is min guard for coming to [facility] to use urinal and to get back to bed. Focus of session core and tricep strengthening as well as relaxation techniques. PT will follow back for ReEval after surgery.    Follow Up Recommendations  SNF     Equipment Recommendations  Rolling walker with 5" wheels;3in1 (PT);Wheelchair (measurements PT);Wheelchair cushion (measurements PT)    Recommendations for Other Services       Precautions / Restrictions Precautions Precautions: Fall;Other (comment) Precaution Comments: sacral wound    Mobility  Bed Mobility Overal bed mobility: Needs Assistance Bed Mobility: Supine to Sit;Sit to Supine     Supine to sit: Min guard Sit to supine: Min guard   General bed mobility comments: increased time and effort to come to EoB with use of bed rail, and for management of LE back in bed and scooting hips up in bed            Balance Overall balance assessment: Needs assistance Sitting-balance support:  (long sitting) Sitting balance-Leahy Scale: Fair                                      Cognition Arousal/Alertness: Awake/alert Behavior During Therapy: WFL for tasks assessed/performed Overall Cognitive Status: Within Functional Limits for tasks assessed                                        Exercises Other Exercises Other Exercises: tricep dips from longsitting x5  Other Exercises: pull to sitting x 5     General Comments General  comments (skin integrity, edema, etc.): Pt is understandably anxious about surgery tomorrow, lead him in some rhythmic breathing       Pertinent Vitals/Pain Pain Assessment: Faces Faces Pain Scale: Hurts whole lot Pain Location: L LE Pain Descriptors / Indicators: Grimacing;Guarding Pain Intervention(s): Limited activity within patient's tolerance;Monitored during session;Repositioned           PT Goals (current goals can now be found in the care plan section) Acute Rehab PT Goals Patient Stated Goal: get stronger before L AKA next week PT Goal Formulation: With patient Time For Goal Achievement: 11/14/20 Potential to Achieve Goals: Fair    Frequency    Min 3X/week      PT Plan Current plan remains appropriate       AM-PAC PT "6 Clicks" Mobility   Outcome Measure  Help needed turning from your back to your side while in a flat bed without using bedrails?: None Help needed moving from lying on your back to sitting on the side of a flat bed without using bedrails?: A Little Help needed moving to and from a bed to a chair (including a wheelchair)?: A Little Help needed standing up from a chair using your arms (e.g., wheelchair or bedside  chair)?: A Lot Help needed to walk in hospital room?: Total Help needed climbing 3-5 steps with a railing? : Total 6 Click Score: 14    End of Session Equipment Utilized During Treatment: Oxygen Activity Tolerance: Patient limited by pain;Patient limited by fatigue Patient left: in bed;with call bell/phone within reach;with family/visitor present Nurse Communication: Mobility status PT Visit Diagnosis: Pain;Difficulty in walking, not elsewhere classified (R26.2);Other abnormalities of gait and mobility (R26.89);Unsteadiness on feet (R26.81);Muscle weakness (generalized) (M62.81) Pain - Right/Left: Left Pain - part of body: Leg     Time: 0518-3358 PT Time Calculation (min) (ACUTE ONLY): 14 min  Charges:  $Therapeutic Exercise:  8-22 mins                     Rella Egelston B. Migdalia Dk PT, DPT Acute Rehabilitation Services Pager [phone] Office [phone]    [place] 11/05/2020, 4:08 PM

## 2020-11-06 ENCOUNTER — Inpatient Hospital Stay (HOSPITAL_COMMUNITY): Payer: BC Managed Care – PPO | Admitting: Anesthesiology

## 2020-11-06 ENCOUNTER — Encounter (HOSPITAL_COMMUNITY)
Admission: EM | Disposition: A | Discharge status: Skilled Nursing Facility | Payer: Self-pay | Source: Home / Self Care | Attending: Internal Medicine

## 2020-11-06 ENCOUNTER — Encounter (HOSPITAL_COMMUNITY): Payer: Self-pay | Admitting: Family Medicine

## 2020-11-06 DIAGNOSIS — I5023 Acute on chronic systolic (congestive) heart failure: Secondary | ICD-10-CM | POA: Diagnosis not present

## 2020-11-06 DIAGNOSIS — I2510 Atherosclerotic heart disease of native coronary artery without angina pectoris: Secondary | ICD-10-CM

## 2020-11-06 DIAGNOSIS — I70229 Atherosclerosis of native arteries of extremities with rest pain, unspecified extremity: Secondary | ICD-10-CM | POA: Diagnosis not present

## 2020-11-06 DIAGNOSIS — Z9889 Other specified postprocedural states: Secondary | ICD-10-CM | POA: Diagnosis not present

## 2020-11-06 DIAGNOSIS — I5042 Chronic combined systolic (congestive) and diastolic (congestive) heart failure: Secondary | ICD-10-CM | POA: Diagnosis not present

## 2020-11-06 DIAGNOSIS — I70222 Atherosclerosis of native arteries of extremities with rest pain, left leg: Secondary | ICD-10-CM

## 2020-11-06 HISTORY — PX: AMPUTATION: SHX166

## 2020-11-06 LAB — RENAL FUNCTION PANEL
Albumin: 2.5 g/dL — ABNORMAL LOW (ref 3.5–5.0)
Anion gap: 12 (ref 5–15)
BUN: 80 mg/dL — ABNORMAL HIGH (ref 8–23)
CO2: 20 mmol/L — ABNORMAL LOW (ref 22–32)
Calcium: 9.1 mg/dL (ref 8.9–10.3)
Chloride: 105 mmol/L (ref 98–111)
Creatinine, Ser: 1.81 mg/dL — ABNORMAL HIGH (ref 0.61–1.24)
GFR, Estimated: 42 mL/min — ABNORMAL LOW (ref >60)
Glucose, Bld: 105 mg/dL — ABNORMAL HIGH (ref 70–99)
Phosphorus: 2.7 mg/dL (ref 2.5–4.6)
Potassium: 4.2 mmol/L (ref 3.5–5.1)
Sodium: 137 mmol/L (ref 135–145)

## 2020-11-06 LAB — GLUCOSE, CAPILLARY
Glucose-Capillary: 112 mg/dL — ABNORMAL HIGH (ref 70–99)
Glucose-Capillary: 108 mg/dL — ABNORMAL HIGH (ref 70–99)
Glucose-Capillary: 134 mg/dL — ABNORMAL HIGH (ref 70–99)
Glucose-Capillary: 195 mg/dL — ABNORMAL HIGH (ref 70–99)
Glucose-Capillary: 233 mg/dL — ABNORMAL HIGH (ref 70–99)

## 2020-11-06 LAB — SURGICAL PCR SCREEN
MRSA, PCR: NEGATIVE
Staphylococcus aureus: NEGATIVE

## 2020-11-06 SURGERY — AMPUTATION, ABOVE KNEE
Anesthesia: General | Site: Knee | Laterality: Left

## 2020-11-06 MED ORDER — FENTANYL CITRATE (PF) 100 MCG/2ML IJ SOLN: 25.0000 ug | INTRAMUSCULAR | Status: DC | PRN

## 2020-11-06 MED ORDER — PHENYLEPHRINE HCL-NACL 10-0.9 MG/250ML-% IV SOLN
INTRAVENOUS | Status: DC | PRN
  Administered 2020-11-06: 50 ug/min via INTRAVENOUS

## 2020-11-06 MED ORDER — FENTANYL CITRATE (PF) 250 MCG/5ML IJ SOLN
INTRAMUSCULAR | Status: AC
  Filled 2020-11-06: qty 5

## 2020-11-06 MED ORDER — CEFAZOLIN SODIUM-DEXTROSE 2-3 GM-%(50ML) IV SOLR
INTRAVENOUS | Status: DC | PRN
  Administered 2020-11-06: 2 g via INTRAVENOUS

## 2020-11-06 MED ORDER — 0.9 % SODIUM CHLORIDE (POUR BTL) OPTIME
TOPICAL | Status: DC | PRN
  Administered 2020-11-06: 1000 mL

## 2020-11-06 MED ORDER — CEFAZOLIN SODIUM-DEXTROSE 2-4 GM/100ML-% IV SOLN
2.0000 g | INTRAVENOUS | Status: DC
  Filled 2020-11-06: qty 100

## 2020-11-06 MED ORDER — DEXAMETHASONE SODIUM PHOSPHATE 10 MG/ML IJ SOLN
INTRAMUSCULAR | Status: AC
  Filled 2020-11-06: qty 1

## 2020-11-06 MED ORDER — LACTATED RINGERS IV SOLN: INTRAVENOUS | Status: DC | PRN

## 2020-11-06 MED ORDER — EPHEDRINE SULFATE-NACL 50-0.9 MG/10ML-% IV SOSY
PREFILLED_SYRINGE | INTRAVENOUS | Status: DC | PRN
  Administered 2020-11-06 (×3): 5 mg via INTRAVENOUS

## 2020-11-06 MED ORDER — ETOMIDATE 2 MG/ML IV SOLN
INTRAVENOUS | Status: DC | PRN
  Administered 2020-11-06: 20 mg via INTRAVENOUS

## 2020-11-06 MED ORDER — PROMETHAZINE HCL 25 MG/ML IJ SOLN: 6.2500 mg | INTRAMUSCULAR | Status: DC | PRN

## 2020-11-06 MED ORDER — ONDANSETRON HCL 4 MG/2ML IJ SOLN
INTRAMUSCULAR | Status: AC
  Filled 2020-11-06: qty 2

## 2020-11-06 MED ORDER — MIDAZOLAM HCL 5 MG/5ML IJ SOLN
INTRAMUSCULAR | Status: DC | PRN
  Administered 2020-11-06: 2 mg via INTRAVENOUS

## 2020-11-06 MED ORDER — MIDAZOLAM HCL 2 MG/2ML IJ SOLN
INTRAMUSCULAR | Status: AC
  Filled 2020-11-06: qty 2

## 2020-11-06 MED ORDER — CEFAZOLIN SODIUM 1 G IJ SOLR
INTRAMUSCULAR | Status: AC
  Filled 2020-11-06: qty 20

## 2020-11-06 MED ORDER — PHENYLEPHRINE 40 MCG/ML (10ML) SYRINGE FOR IV PUSH (FOR BLOOD PRESSURE SUPPORT)
PREFILLED_SYRINGE | INTRAVENOUS | Status: DC | PRN
  Administered 2020-11-06 (×2): 100 ug via INTRAVENOUS
  Administered 2020-11-06 (×2): 80 ug via INTRAVENOUS
  Administered 2020-11-06: 200 ug via INTRAVENOUS

## 2020-11-06 MED ORDER — FENTANYL CITRATE (PF) 100 MCG/2ML IJ SOLN
INTRAMUSCULAR | Status: DC | PRN
  Administered 2020-11-06 (×3): 50 ug via INTRAVENOUS

## 2020-11-06 MED ORDER — STERILE WATER FOR IRRIGATION IR SOLN
Status: DC | PRN
  Administered 2020-11-06: 1000 mL

## 2020-11-06 MED ORDER — PROPOFOL 10 MG/ML IV BOLUS
INTRAVENOUS | Status: AC
  Filled 2020-11-06: qty 20

## 2020-11-06 MED ORDER — LIDOCAINE 2% (20 MG/ML) 5 ML SYRINGE
INTRAMUSCULAR | Status: AC
  Filled 2020-11-06: qty 5

## 2020-11-06 MED ORDER — PHENYLEPHRINE 40 MCG/ML (10ML) SYRINGE FOR IV PUSH (FOR BLOOD PRESSURE SUPPORT)
PREFILLED_SYRINGE | INTRAVENOUS | Status: AC
  Filled 2020-11-06: qty 20

## 2020-11-06 MED ORDER — ETOMIDATE 2 MG/ML IV SOLN
INTRAVENOUS | Status: AC
  Filled 2020-11-06: qty 10

## 2020-11-06 MED ORDER — LIDOCAINE 2% (20 MG/ML) 5 ML SYRINGE
INTRAMUSCULAR | Status: DC | PRN
  Administered 2020-11-06: 100 mg via INTRAVENOUS

## 2020-11-06 MED ORDER — EPHEDRINE 5 MG/ML INJ
INTRAVENOUS | Status: AC
  Filled 2020-11-06: qty 10

## 2020-11-06 MED ORDER — ALBUMIN HUMAN 5 % IV SOLN: INTRAVENOUS | Status: DC | PRN

## 2020-11-06 MED ORDER — FENTANYL CITRATE (PF) 100 MCG/2ML IJ SOLN
50.0000 ug | Freq: Once | INTRAMUSCULAR | Status: AC
  Administered 2020-11-06: 50 ug via INTRAVENOUS
  Filled 2020-11-06: qty 2

## 2020-11-06 MED ORDER — DEXAMETHASONE SODIUM PHOSPHATE 10 MG/ML IJ SOLN
INTRAMUSCULAR | Status: DC | PRN
  Administered 2020-11-06: 10 mg via INTRAVENOUS

## 2020-11-06 SURGICAL SUPPLY — 45 items
BANDAGE ESMARK 6X9 LF (GAUZE/BANDAGES/DRESSINGS) IMPLANT
BLADE SAW GIGLI 510 (BLADE) ×2 IMPLANT
BNDG ELASTIC 4X5.8 VLCR STR LF (GAUZE/BANDAGES/DRESSINGS) ×2 IMPLANT
BNDG ELASTIC 6X5.8 VLCR STR LF (GAUZE/BANDAGES/DRESSINGS) ×2 IMPLANT
BNDG ESMARK 6X9 LF (GAUZE/BANDAGES/DRESSINGS)
BNDG GAUZE ELAST 4 BULKY (GAUZE/BANDAGES/DRESSINGS) ×3 IMPLANT
CANISTER SUCT 3000ML PPV (MISCELLANEOUS) ×2 IMPLANT
CLIP LIGATING EXTRA MED SLVR (CLIP) ×2 IMPLANT
CLIP LIGATING EXTRA SM BLUE (MISCELLANEOUS) ×2 IMPLANT
COVER SURGICAL LIGHT HANDLE (MISCELLANEOUS) ×5 IMPLANT
COVER WAND RF STERILE (DRAPES) ×2 IMPLANT
CUFF TOURN SGL QUICK 34 (TOURNIQUET CUFF)
CUFF TOURN SGL QUICK 42 (TOURNIQUET CUFF) IMPLANT
CUFF TRNQT CYL 34X4.125X (TOURNIQUET CUFF) IMPLANT
DRAIN SNY 10X20 3/4 PERF (WOUND CARE) IMPLANT
DRAPE HALF SHEET 40X57 (DRAPES) ×2 IMPLANT
DRAPE ORTHO SPLIT 77X108 STRL (DRAPES) ×4
DRAPE SURG ORHT 6 SPLT 77X108 (DRAPES) ×2 IMPLANT
ELECT CAUTERY BLADE 6.4 (BLADE) ×2 IMPLANT
ELECT REM PT RETURN 9FT ADLT (ELECTROSURGICAL) ×2
ELECTRODE REM PT RTRN 9FT ADLT (ELECTROSURGICAL) ×1 IMPLANT
EVACUATOR SILICONE 100CC (DRAIN) IMPLANT
GAUZE SPONGE 4X4 12PLY STRL (GAUZE/BANDAGES/DRESSINGS) ×3 IMPLANT
GAUZE XEROFORM 5X9 LF (GAUZE/BANDAGES/DRESSINGS) ×2 IMPLANT
GLOVE BIO SURGEON STRL SZ 6.5 (GLOVE) ×1 IMPLANT
GLOVE SS BIOGEL STRL SZ 7.5 (GLOVE) ×1 IMPLANT
GLOVE SUPERSENSE BIOGEL SZ 7.5 (GLOVE) ×1
GOWN STRL REUS W/ TWL LRG LVL3 (GOWN DISPOSABLE) ×3 IMPLANT
GOWN STRL REUS W/TWL LRG LVL3 (GOWN DISPOSABLE) ×6
KIT BASIN OR (CUSTOM PROCEDURE TRAY) ×2 IMPLANT
KIT TURNOVER KIT B (KITS) ×2 IMPLANT
NS IRRIG 1000ML POUR BTL (IV SOLUTION) ×2 IMPLANT
PACK GENERAL/GYN (CUSTOM PROCEDURE TRAY) ×2 IMPLANT
PAD ARMBOARD 7.5X6 YLW CONV (MISCELLANEOUS) ×4 IMPLANT
PADDING CAST COTTON 6X4 STRL (CAST SUPPLIES) IMPLANT
STAPLER VISISTAT 35W (STAPLE) ×2 IMPLANT
STOCKINETTE IMPERVIOUS LG (DRAPES) ×2 IMPLANT
SUT ETHILON 3 0 PS 1 (SUTURE) IMPLANT
SUT VIC AB 0 CT1 18XCR BRD 8 (SUTURE) ×2 IMPLANT
SUT VIC AB 0 CT1 8-18 (SUTURE) ×4
SUT VICRYL AB 2 0 TIES (SUTURE) ×2 IMPLANT
SYR BULB IRRIG 60ML STRL (SYRINGE) ×1 IMPLANT
TOWEL GREEN STERILE (TOWEL DISPOSABLE) ×4 IMPLANT
UNDERPAD 30X36 HEAVY ABSORB (UNDERPADS AND DIAPERS) ×2 IMPLANT
WATER STERILE IRR 1000ML POUR (IV SOLUTION) ×2 IMPLANT

## 2020-11-06 NOTE — Plan of Care (Signed)
  Problem: Safety: Goal: Ability to remain free from injury will improve Outcome: Progressing   

## 2020-11-06 NOTE — Progress Notes (Signed)
Inpatient Rehabilitation Admissions Coordinator  Inpatient rehab consult received. I await postop therapy evaluations and then will assess patient for rehab venue options.  Danne Baxter, RN, MSN Rehab Admissions Coordinator [phone] 11/06/2020 12:38 PM

## 2020-11-06 NOTE — Progress Notes (Signed)
Given report to Marlowe Kays, RN at short stay. Telemonitor has been removed. CCMD has been notified.

## 2020-11-06 NOTE — OR Nursing (Signed)
Pt is awake,alert and oriented.Pt and/or family verbalized understanding of poc and discharge instructions. Reviewed admission and on going care with receiving RN. Pt is in NAD at this time and is ready to be transferred to floor. Will con't to monitor until pt is transferred. Belongings on bed with patient Pt on Monitor NSR with occasional PAC Pt is sitting up eating some cheese at this time, no difficulty noted eating.  Will transition to floor.

## 2020-11-06 NOTE — Anesthesia Procedure Notes (Signed)
Arterial Line Insertion Start/End11/08/2020 9:10 AM, 11/06/2020 9:20 AM Performed by: Lance Coon, CRNA, CRNA  Preanesthetic checklist: patient identified, IV checked, site marked, risks and benefits discussed, surgical consent, monitors and equipment checked, pre-op evaluation, timeout performed and anesthesia consent Lidocaine 1% used for infiltration Right, radial was placed Catheter size: 20 G Hand hygiene performed , maximum sterile barriers used  and Seldinger technique used  Attempts: 2 Procedure performed without using ultrasound guided technique. Following insertion, dressing applied and Biopatch. Post procedure assessment: normal and unchanged  Patient tolerated the procedure well with no immediate complications.

## 2020-11-06 NOTE — Transfer of Care (Signed)
Immediate Anesthesia Transfer of Care Note  Patient: Mike [name]  Procedure(s) Performed: AMPUTATION ABOVE KNEE (Left Knee)  Patient Location: PACU  Anesthesia Type:General  Level of Consciousness: drowsy and patient cooperative  Airway & Oxygen Therapy: Patient Spontanous Breathing and Patient connected to nasal cannula oxygen  Post-op Assessment: Report given to RN and Post -op Vital signs reviewed and stable  Post vital signs: Reviewed and stable  Last Vitals:  Vitals Value Taken Time  BP    Temp    Pulse    Resp    SpO2      Last Pain:  Vitals:   11/06/20 0753  TempSrc:   PainSc: 8       Patients Stated Pain Goal: 5 (77/93/90 3009)  Complications: No complications documented.

## 2020-11-06 NOTE — Anesthesia Postprocedure Evaluation (Signed)
Anesthesia Post Note  Patient: Mike [name]  Procedure(s) Performed: AMPUTATION ABOVE KNEE (Left Knee)     Patient location during evaluation: PACU Anesthesia Type: General Level of consciousness: sedated Pain management: pain level controlled Vital Signs Assessment: post-procedure vital signs reviewed and stable Respiratory status: spontaneous breathing and respiratory function stable Cardiovascular status: stable Postop Assessment: no apparent nausea or vomiting Anesthetic complications: no   No complications documented.  Last Vitals:  Vitals:   11/06/20 1315 11/06/20 1346  BP: 97/60 (!) 97/50  Pulse: 75 76  Resp: 20 18  Temp:  36.6 C  SpO2: 95% 97%    Last Pain:  Vitals:   11/06/20 1346  TempSrc: Oral  PainSc:                  [place]

## 2020-11-06 NOTE — Anesthesia Preprocedure Evaluation (Addendum)
Anesthesia Evaluation  Patient identified by MRN, date of birth, ID band Patient awake    Reviewed: Allergy & Precautions, NPO status , Patient's Chart, lab work & pertinent test results  History of Anesthesia Complications Negative for: history of anesthetic complications  Airway Mallampati: II  TM Distance: >3 FB Neck ROM: Full    Dental no notable dental hx. (+) Dental Advisory Given   Pulmonary Current Smoker,    Pulmonary exam normal        Cardiovascular hypertension, + CAD, + Past MI, + Cardiac Stents, + Peripheral Vascular Disease and +CHF  Normal cardiovascular exam  Echocardiogram 09/11/2020:  Left ventricle cavity is moderately dilated. Moderate concentric  hypertrophy of the left ventricle. Moderate global and severe  inferolateral hypokinesis. LVEF 15-20%. Grade 1 diastolic dysfunction.  Normal left atrial pressure. Calculated EF 15%.  Mild to moderate mitral regurgitation.  Inadequate TR jet to estimate pulmonary artery systolic pressure. Normal  right atrial pressure.  Compared to previous study in 03/2020, LVEF is reduced from 30-35%. Mitral  regurgitation is marginally improved.  Coronary intervention 08/03/2020: LM: Distal 40% stenosis LAD:Mid 30% disease LCx: Subtotally occluded OM2, prox LCx 70% stenosis Successful percutaneous coronary intervention OM2-Prox LCx PTCA and overlapping stents placement  2.5 X 38 mm and 2.5 X 18 mm Resolute Onyx drug-eluting stents 100%--->0% stenosis. TIMI flow 0-->III Small caliber distal vessel with moderate diffuse disease RCA: Prox 30% stenosis. Patent mid RCA sttent 2.5 X 26 mm Resolute Onyx drug-eluting stent  LVEDP 42 mmHg   Neuro/Psych negative neurological ROS     GI/Hepatic negative GI ROS, Neg liver ROS,   Endo/Other  negative endocrine ROS  Renal/GU Renal InsufficiencyRenal disease      Musculoskeletal negative musculoskeletal ROS (+)   Abdominal   Peds  Hematology negative hematology ROS (+)   Anesthesia Other Findings   Reproductive/Obstetrics                            Anesthesia Physical Anesthesia Plan  ASA: IV  Anesthesia Plan: General   Post-op Pain Management:  Regional for Post-op pain   Induction: Intravenous  PONV Risk Score and Plan: 2 and Ondansetron and Dexamethasone  Airway Management Planned:   Additional Equipment: Arterial line  Intra-op Plan:   Post-operative Plan: Extubation in OR  Informed Consent: I have reviewed the patients History and Physical, chart, labs and discussed the procedure including the risks, benefits and alternatives for the proposed anesthesia with the patient or authorized representative who has indicated his/her understanding and acceptance.     Dental advisory given  Plan Discussed with: Anesthesiologist and CRNA  Anesthesia Plan Comments:        Anesthesia Quick Evaluation

## 2020-11-06 NOTE — Progress Notes (Signed)
[facility] PROGRESS NOTE    Progress Note  Mike [name]  KZS:[id number] DOB: 1961-06-27 DOA: 10/30/2020 PCP: Patient, No Pcp Per     Brief Narrative:   Mike [name] is an 59 y.o. male past medical history significant for STEMI in 08/17/2020 with moderate MR cardiac arrest secondary to torsade post PCI, he has no known resolve ST segment elevation in the inferior leads since his STEMI in 08/17/2020, critical limb ischemia with right iliac revascularization with residual limb ischemia in the left lower extremity, severe bilateral renal artery stenosis, acute kidney injury following arteriogram on 10/16/2020 is into the hospital complaining of shortness of breath and inability to walk due to severe leg pain.  Cardiology was consulted and unfortunately he has an extremely high risk of limb loss and in view of his underlying chronic kidney disease stage IV chronic systolic heart failure that is due to ischemic cardiomyopathy he has an extremely high risk of mortality.  Urology recommended to consult   Assessment/Plan:   Critical limb ischemia with pain at rest on the left lower extremity with no revascularization at this point, vascular surgery consult was obtained on 10/17/2020: Vascular surgery was consulted and they recommended above-the-knee amputation, he was changed to aspirin and Plavix.   S/p above the knee amputation on 11/06/2020.  He relates his pain is not controlled, recent PCA pump. Resume PT eval.  Ongoing tobacco abuse: The admitting physician spoke to him and at this point in time he is interested in quitting and nicotine patch was ordered.  Hyperlipidemia: Continue statins.  Coronary artery disease: Continue statins, beta-blockers, aspirin and Plavix. Tolerating his Coreg.  Chronic kidney disease stage IV: It is creatinine is better than baseline this morning, is 1.8 continue to hold Lasix he is tolerating his Coreg.  Recheck a basic metabolic panel in the  morning.    Hyperglycemia/diabetes mellitus type 2: Fairly well controlled continue sliding scale insulin  History of ischemic cardiomyopathy: He appears euvolemic continue to hold Lasix blood pressure slightly low since starting Coreg will discuss with cardiology, see if we get placed him on a selective beta-blocker instead of Coreg.  To allow better renal perfusion and MAP greater than 65.  Leukocytosis: He has remained afebrile, likely due to ischemic limb  Severe protein caloric malnutrition:  DVT prophylaxis: lovenxo Family Communication:none Status is: Inpatient  Remains inpatient appropriate because:Hemodynamically unstable   Dispo: The patient is from: Home              Anticipated d/c is to: SNF              Anticipated d/c date is: 3 days              Patient currently is not medically stable to d/c.  Once amputation has been performed.    Code Status:     Code Status Orders  (From admission, onward)           Start     Ordered   10/31/20 0235  Limited resuscitation (code)  Continuous       Question Answer Comment  In the event of cardiac or respiratory ARREST: Initiate Code Blue, Call Rapid Response Yes   In the event of cardiac or respiratory ARREST: Perform CPR Yes   In the event of cardiac or respiratory ARREST: Perform Intubation/Mechanical Ventilation No   In the event of cardiac or respiratory ARREST: Use NIPPV/BiPAp only if indicated Yes   In the event of cardiac or  respiratory ARREST: Administer ACLS medications if indicated Yes   In the event of cardiac or respiratory ARREST: Perform Defibrillation or Cardioversion if indicated Yes      10/31/20 0234           Code Status History     Date Active Date Inactive Code Status Order ID Comments User Context   10/16/2020 1912 10/18/2020 2008 Full Code [id number]  Nigel Mormon, MD Inpatient   08/02/2020 0035 08/04/2020 1745 Full Code [id number]  Nigel Mormon, MD Inpatient   Advance Care  Planning Activity      Advance Directive Documentation      Most Recent Value  Type of Advance Directive Living will, Healthcare Power of Attorney  Pre-existing out of facility DNR order (yellow form or pink MOST form) --  "MOST" Form in Place? --         IV Access:    Peripheral IV   Procedures and diagnostic studies:   No results found.   Medical Consultants:    None.  Anti-Infectives:   none  Subjective:    Mike [name] pain is not controlled.  Objective:    Vitals:   11/05/20 2352 11/06/20 0252 11/06/20 0432 11/06/20 0753  BP: (!) 102/52  (!) 86/68   Pulse: 70  68   Resp: 20 20 20 20   Temp: 98.7 F (37.1 C)  97.8 F (36.6 C)   TempSrc: Oral  Oral   SpO2: 99% 99% 99% 97%  Weight:   68.2 kg   Height:       SpO2: 97 % O2 Flow Rate (L/min): 2 L/min   Intake/Output Summary (Last 24 hours) at 11/06/2020 0913 Last data filed at 11/06/2020 0436 Gross per 24 hour  Intake 1017 ml  Output 1410 ml  Net -393 ml   Filed Weights   11/04/20 0412 11/05/20 0425 11/06/20 0432  Weight: 67 kg 67.7 kg 68.2 kg    Exam: General exam: In no acute distress. Respiratory system: Good air movement and clear to auscultation. Cardiovascular system: S1 & S2 heard, RRR. No JVD. Gastrointestinal system: Abdomen is nondistended, soft and nontender.  Extremities: No pedal edema. Skin: No rashes, lesions or ulcers Psychiatry: Judgement and insight appear normal. Mood & affect appropriate.  Data Reviewed:    Labs: Basic Metabolic Panel: Recent Labs  Lab 10/31/20 0429 10/31/20 0955 11/01/20 0928 11/01/20 0928 11/02/20 0151 11/02/20 0151 11/03/20 0358 11/03/20 0358 11/04/20 1440 11/04/20 1440 11/05/20 0555 11/06/20 0413  NA  --    < > 134*   < > 133*  --  134*  --  134*  --  136 137  K  --    < > 3.6   < > 3.1*   < > 4.5   < > 4.8   < > 4.5 4.2  CL  --    < > 103   < > 103  --  107  --  106  --  108 105  CO2  --    < > 13*   < > 15*  --  15*  --   15*  --  20* 20*  GLUCOSE  --    < > 119*   < > 96  --  116*  --  102*  --  151* 105*  BUN  --    < > 111*   < > 110*  --  110*  --  96*  --  91* 80*  CREATININE  --    < >  3.75*   < > 3.45*  --  3.06*  --  2.25*  --  2.11* 1.81*  CALCIUM  --    < > 8.9   < > 8.8*  --  9.2  --  9.2  --  9.0 9.1  MG 2.4  --   --   --   --   --   --   --   --   --   --   --   PHOS 6.5*  --  6.2*  --   --   --   --   --  3.5  --  2.9 2.7   < > = values in this interval not displayed.   GFR Estimated Creatinine Clearance: 41.3 mL/min (A) (by C-G formula based on SCr of 1.81 mg/dL (H)). Liver Function Tests: Recent Labs  Lab 10/31/20 0429 11/04/20 1440 11/05/20 0555 11/06/20 0413  AST 235*  --   --   --   ALT 123*  --   --   --   ALKPHOS 81  --   --   --   BILITOT 0.9  --   --   --   PROT 7.2  --   --   --   ALBUMIN 2.5* 2.6* 2.4* 2.5*   No results for input(s): LIPASE, AMYLASE in the last 168 hours. No results for input(s): AMMONIA in the last 168 hours. Coagulation profile Recent Labs  Lab 10/30/20 1822  INR 1.2   COVID-19 Labs  No results for input(s): DDIMER, FERRITIN, LDH, CRP in the last 72 hours.  Lab Results  Component Value Date   SARSCOV2NAA NEGATIVE 10/30/2020   [place] NEGATIVE 10/12/2020   [place] NEGATIVE 08/02/2020    CBC: Recent Labs  Lab 10/30/20 1652 10/31/20 0429 11/01/20 0928 11/02/20 0151 11/03/20 0358  WBC 21.9* 19.5* 18.2* 16.3* 15.7*  NEUTROABS  --  17.0* 16.1* 14.1* 13.9*  HGB 12.2* 11.4* 11.4* 10.4* 10.5*  HCT 37.3* 34.6* 34.0* 31.1* 31.9*  MCV 85.4 83.8 85.2 83.4 86.2  PLT 379 296 288 242 248   Cardiac Enzymes: No results for input(s): CKTOTAL, CKMB, CKMBINDEX, TROPONINI in the last 168 hours. BNP (last 3 results) Recent Labs    09/25/20 0904 10/12/20 0812  PROBNP 2,404* 1,642*   CBG: Recent Labs  Lab 11/05/20 1207 11/05/20 1657 11/05/20 2311 11/06/20 0521 11/06/20 0758  GLUCAP 109* 161* 127* 112* 108*   D-Dimer: No results  for input(s): DDIMER in the last 72 hours. Hgb A1c: No results for input(s): HGBA1C in the last 72 hours. Lipid Profile: No results for input(s): CHOL, HDL, LDLCALC, TRIG, CHOLHDL, LDLDIRECT in the last 72 hours. Thyroid function studies: No results for input(s): TSH, T4TOTAL, T3FREE, THYROIDAB in the last 72 hours.  Invalid input(s): FREET3 Anemia work up: No results for input(s): VITAMINB12, FOLATE, FERRITIN, TIBC, IRON, RETICCTPCT in the last 72 hours. Sepsis Labs: Recent Labs  Lab 10/30/20 1652 10/30/20 2111 10/30/20 2141 10/31/20 0133 10/31/20 0429 11/01/20 0928 11/02/20 0151 11/03/20 0358  PROCALCITON  --  1.19  --   --   --   --   --   --   WBC   < >  --   --   --  19.5* 18.2* 16.3* 15.7*  LATICACIDVEN  --   --  1.5 1.3  --   --   --   --    < > = values in this interval not displayed.   Microbiology Recent Results (  from the past 240 hour(s))  Respiratory Panel by RT PCR (Flu A&B, Covid) - Nasopharyngeal Swab     Status: None   Collection Time: 10/30/20  7:51 PM   Specimen: Nasopharyngeal Swab  Result Value Ref Range Status   SARS Coronavirus 2 by RT PCR NEGATIVE NEGATIVE Final    Comment: (NOTE) SARS-CoV-2 target nucleic acids are NOT DETECTED.  The SARS-CoV-2 RNA is generally detectable in upper respiratoy specimens during the acute phase of infection. The lowest concentration of SARS-CoV-2 viral copies this assay can detect is 131 copies/mL. A negative result does not preclude SARS-Cov-2 infection and should not be used as the sole basis for treatment or other patient management decisions. A negative result may occur with  improper specimen collection/handling, submission of specimen other than nasopharyngeal swab, presence of viral mutation(s) within the areas targeted by this assay, and inadequate number of viral copies (<131 copies/mL). A negative result must be combined with clinical observations, patient history, and epidemiological information.  The expected result is Negative.  Fact Sheet for Patients:  PinkCheek.be  Fact Sheet for Healthcare Providers:  GravelBags.it  This test is no t yet approved or cleared by the [place] FDA and  has been authorized for detection and/or diagnosis of SARS-CoV-2 by FDA under an Emergency Use Authorization (EUA). This EUA will remain  in effect (meaning this test can be used) for the duration of the COVID-19 declaration under Section 564(b)(1) of the Act, 21 U.S.C. section 360bbb-3(b)(1), unless the authorization is terminated or revoked sooner.     Influenza A by PCR NEGATIVE NEGATIVE Final   Influenza B by PCR NEGATIVE NEGATIVE Final    Comment: (NOTE) The Xpert Xpress SARS-CoV-2/FLU/RSV assay is intended as an aid in  the diagnosis of influenza from Nasopharyngeal swab specimens and  should not be used as a sole basis for treatment. Nasal washings and  aspirates are unacceptable for Xpert Xpress SARS-CoV-2/FLU/RSV  testing.  Fact Sheet for Patients: PinkCheek.be  Fact Sheet for Healthcare Providers: GravelBags.it  This test is not yet approved or cleared by the [place] FDA and  has been authorized for detection and/or diagnosis of SARS-CoV-2 by  FDA under an Emergency Use Authorization (EUA). This EUA will remain  in effect (meaning this test can be used) for the duration of the  Covid-19 declaration under Section 564(b)(1) of the Act, 21  U.S.C. section 360bbb-3(b)(1), unless the authorization is  terminated or revoked. Performed at [facility], [place] [address], [place], [place] [address]   Culture, blood (x 2)     Status: None   Collection Time: 10/30/20  9:41 PM   Specimen: BLOOD  Result Value Ref Range Status   Specimen Description BLOOD RIGHT ANTECUBITAL  Final   Special Requests   Final    BOTTLES DRAWN AEROBIC AND ANAEROBIC Blood  Culture adequate volume   Culture   Final    NO GROWTH 5 DAYS Performed at [facility], [place] [address], [place], [place] [address]    Report Status 11/04/2020 FINAL  Final  Culture, blood (x 2)     Status: None   Collection Time: 10/31/20  1:20 AM   Specimen: BLOOD  Result Value Ref Range Status   Specimen Description BLOOD RIGHT ANTECUBITAL  Final   Special Requests   Final    BOTTLES DRAWN AEROBIC AND ANAEROBIC Blood Culture adequate volume   Culture   Final    NO GROWTH 5 DAYS Performed at [facility]  [facility], [place] [address] [place], [place] [address]    Report Status 11/05/2020 FINAL  Final  Surgical PCR screen     Status: None   Collection Time: 11/06/20  2:40 AM   Specimen: Nasal Mucosa; Nasal Swab  Result Value Ref Range Status   MRSA, PCR NEGATIVE NEGATIVE Final   Staphylococcus aureus NEGATIVE NEGATIVE Final    Comment: (NOTE) The Xpert SA Assay (FDA approved for NASAL specimens in patients 24 years of age and older), is one component of a comprehensive surveillance program. It is not intended to diagnose infection nor to guide or monitor treatment. Performed at [facility], [place] [address] [place], [place] [address]      Medications:   . [MAR Hold] aspirin EC  81 mg Oral Daily  . [MAR Hold] carvedilol  3.125 mg Oral BID WC  . [MAR Hold] clopidogrel  75 mg Oral Daily  . [MAR Hold] feeding supplement  237 mL Oral BID BM  . [MAR Hold] fentaNYL   Intravenous Q4H  . [MAR Hold] Gerhardt's butt cream   Topical BID  . [MAR Hold] heparin  5,000 Units Subcutaneous Q8H  . [MAR Hold] insulin aspart  0-6 Units Subcutaneous Q4H  . [MAR Hold] ivabradine  2.5 mg Oral BID WC  . [MAR Hold] multivitamin with minerals  1 tablet Oral Daily  . [MAR Hold] mupirocin ointment  1 application Nasal BID  . [MAR Hold] nicotine  7 mg Transdermal Q24H  . [MAR Hold] oxyCODONE  15 mg Oral Q12H  . [MAR Hold] sodium bicarbonate  1,300 mg Oral BID   Continuous Infusions: .  [facility] Hold] sodium chloride 10 mL/hr at 11/02/20 2000      LOS: 7 days   Charlynne Cousins  [facility]  11/06/2020, 9:13 AM

## 2020-11-06 NOTE — Progress Notes (Signed)
Patient has arrived back from the PACU. Patient is alert and oriented x 4 and expresses he is in pain from the surgery. Pain 10/10. Venetia Constable, MD is on the unit and has ordered a one time dose of 1mcg IV fentanyl until the PCA is set up and resumed. Site is unaccessible - wrapped with ace bandage but femoral pulse is +1.  Patient's siblings are the bedside. Call bell is within reach and bed is set at the lowest setting.

## 2020-11-06 NOTE — OR Nursing (Signed)
Pt back to room, placed on Tele box, SR noted, both RN's present to hand off patient to.  Pt is in NAD complaining of some pain to left leg.

## 2020-11-06 NOTE — Op Note (Signed)
    OPERATIVE REPORT  DATE OF SURGERY: 11/06/2020  PATIENT: Mike [name], 59 y.o. male MRN: [id number]  DOB: [birth year]  PRE-OPERATIVE DIAGNOSIS: Critical limb ischemia left leg  POST-OPERATIVE DIAGNOSIS:  Same  PROCEDURE: Left above-knee amputation  SURGEON:  Curt Jews, M.D.  PHYSICIAN ASSISTANT: Nurse  The assistant was needed for exposure and to expedite the case  ANESTHESIA: LMA  EBL: per anesthesia record  Total I/O In: 750 [I.V.:500; IV Piggyback:250] Out: 100 [Blood:100]  BLOOD ADMINISTERED: none  DRAINS: none  SPECIMEN: none  COUNTS CORRECT:  YES  PATIENT DISPOSITION:  PACU - hemodynamically stable  PROCEDURE DETAILS: Patient was taken up and placed to position the area of the left leg prepped draped you sterile fashion.  [place] incision was made in the distal thigh and carried down through the muscle down to the level of the femur.  The muscle all did appear to be viable.  The smaller vessels were occluded with electrocautery and the medium size with hemostat and ligated with 0 Vicryl ties.  The superficial femoral artery was chronically occluded.  This was ligated with a 0 Vicryl tie.  The femoral vein was ligated with 0 Vicryl ties.  The sciatic nerve was ligated further proximally than the incision and was divided.  Periosteum was elevated off the femur and the femur was divided with a Gigli saw.  Specimen was passed off the field.  The wound was irrigated with saline.  Hemostasis was obtained with electrocautery.  The wound was closed with 2-0 Vicryl figure-of-eight sutures in the fascia.  Skin was closed with skin clips.  Sterile dressing and Ace wrap were applied and the patient was transferred to the recovery room in stable condition   Mike [name], M.D., [facility] 11/06/2020 10:54 AM

## 2020-11-06 NOTE — Interval H&P Note (Signed)
History and Physical Interval Note:  11/06/2020 9:09 AM  Mike [name]  has presented today for surgery, with the diagnosis of CRITICAL LOWER LIMB ISCHEMIA.  The various methods of treatment have been discussed with the patient and family. After consideration of risks, benefits and other options for treatment, the patient has consented to  Procedure(s): AMPUTATION ABOVE KNEE (Left) as a surgical intervention.  The patient's history has been reviewed, patient examined, no change in status, stable for surgery.  I have reviewed the patient's chart and labs.  Questions were answered to the patient's satisfaction.     Curt Jews

## 2020-11-06 NOTE — OR Nursing (Signed)
Dr. Curly Rim for new floor order

## 2020-11-06 NOTE — OR Nursing (Signed)
Dr. Donnetta Hutching notified-this nurse spoke with Dr,  pt is not appropriate for [facility] due to labile blood pressures ranging from 86/50-94/60.  Pt is not on drips at this time but any adjustment in bed contributes to the waxing/wanning of blood pressure, [facility] Charge notified order was canceled for 5N and pt will be returning for monitoring until appropriate to move patient to a different floor.

## 2020-11-06 NOTE — Anesthesia Procedure Notes (Signed)
Procedure Name: LMA Insertion Date/Time: 11/06/2020 9:42 AM Performed by: Lance Coon, CRNA Pre-anesthesia Checklist: Patient identified, Emergency Drugs available, Suction available, Patient being monitored and Timeout performed Patient Re-evaluated:Patient Re-evaluated prior to induction Oxygen Delivery Method: Circle system utilized Preoxygenation: Pre-oxygenation with 100% oxygen Induction Type: IV induction Ventilation: Mask ventilation without difficulty LMA: LMA inserted LMA Size: 4.0 Number of attempts: 1 Placement Confirmation: positive ETCO2 and breath sounds checked- equal and bilateral Tube secured with: Tape Dental Injury: Teeth and Oropharynx as per pre-operative assessment

## 2020-11-06 NOTE — Progress Notes (Signed)
PCA pump discontinued in OR. Wasted 87ml of Fentanyl in stericycle  with Shirlee Limerick, RN charge Nurse 3E. PCA key returned to pyxis.

## 2020-11-07 ENCOUNTER — Encounter (HOSPITAL_COMMUNITY): Payer: Self-pay | Admitting: Vascular Surgery

## 2020-11-07 DIAGNOSIS — I959 Hypotension, unspecified: Secondary | ICD-10-CM

## 2020-11-07 DIAGNOSIS — I2510 Atherosclerotic heart disease of native coronary artery without angina pectoris: Secondary | ICD-10-CM | POA: Diagnosis not present

## 2020-11-07 DIAGNOSIS — I5023 Acute on chronic systolic (congestive) heart failure: Secondary | ICD-10-CM | POA: Diagnosis not present

## 2020-11-07 DIAGNOSIS — E872 Acidosis, unspecified: Secondary | ICD-10-CM

## 2020-11-07 DIAGNOSIS — I70229 Atherosclerosis of native arteries of extremities with rest pain, unspecified extremity: Secondary | ICD-10-CM | POA: Diagnosis not present

## 2020-11-07 DIAGNOSIS — I5042 Chronic combined systolic (congestive) and diastolic (congestive) heart failure: Secondary | ICD-10-CM | POA: Diagnosis not present

## 2020-11-07 LAB — GLUCOSE, CAPILLARY
Glucose-Capillary: 136 mg/dL — ABNORMAL HIGH (ref 70–99)
Glucose-Capillary: 141 mg/dL — ABNORMAL HIGH (ref 70–99)
Glucose-Capillary: 148 mg/dL — ABNORMAL HIGH (ref 70–99)
Glucose-Capillary: 152 mg/dL — ABNORMAL HIGH (ref 70–99)
Glucose-Capillary: 153 mg/dL — ABNORMAL HIGH (ref 70–99)
Glucose-Capillary: 162 mg/dL — ABNORMAL HIGH (ref 70–99)

## 2020-11-07 LAB — CBC
HCT: 23.3 % — ABNORMAL LOW (ref 39.0–52.0)
Hemoglobin: 7.4 g/dL — ABNORMAL LOW (ref 13.0–17.0)
MCH: 28 pg (ref 26.0–34.0)
MCHC: 31.8 g/dL (ref 30.0–36.0)
MCV: 88.3 fL (ref 80.0–100.0)
Platelets: 183 10*3/uL (ref 150–400)
RBC: 2.64 MIL/uL — ABNORMAL LOW (ref 4.22–5.81)
RDW: 18 % — ABNORMAL HIGH (ref 11.5–15.5)
WBC: 19.7 10*3/uL — ABNORMAL HIGH (ref 4.0–10.5)
nRBC: 0 % (ref 0.0–0.2)

## 2020-11-07 LAB — RENAL FUNCTION PANEL
Albumin: 2.2 g/dL — ABNORMAL LOW (ref 3.5–5.0)
Anion gap: 8 (ref 5–15)
BUN: 58 mg/dL — ABNORMAL HIGH (ref 8–23)
CO2: 22 mmol/L (ref 22–32)
Calcium: 9 mg/dL (ref 8.9–10.3)
Chloride: 109 mmol/L (ref 98–111)
Creatinine, Ser: 1.34 mg/dL — ABNORMAL HIGH (ref 0.61–1.24)
GFR, Estimated: >60 mL/min (ref >60)
Glucose, Bld: 161 mg/dL — ABNORMAL HIGH (ref 70–99)
Phosphorus: 3.2 mg/dL (ref 2.5–4.6)
Potassium: 3.7 mmol/L (ref 3.5–5.1)
Sodium: 139 mmol/L (ref 135–145)

## 2020-11-07 LAB — SURGICAL PATHOLOGY

## 2020-11-07 LAB — HEMOGLOBIN AND HEMATOCRIT, BLOOD
HCT: 24.2 % — ABNORMAL LOW (ref 39.0–52.0)
Hemoglobin: 7.7 g/dL — ABNORMAL LOW (ref 13.0–17.0)

## 2020-11-07 MED ORDER — ACETAMINOPHEN 500 MG PO TABS
1000.0000 mg | ORAL_TABLET | Freq: Once | ORAL | Status: DC
  Filled 2020-11-07 (×2): qty 2

## 2020-11-07 MED ORDER — SACUBITRIL-VALSARTAN 24-26 MG PO TABS
1.0000 | ORAL_TABLET | Freq: Two times a day (BID) | ORAL | Status: DC
  Administered 2020-11-07 – 2020-11-08 (×2): 1 via ORAL
  Filled 2020-11-07 (×3): qty 1

## 2020-11-07 MED ORDER — GLUCERNA SHAKE PO LIQD
237.0000 mL | Freq: Four times a day (QID) | ORAL | Status: DC
  Administered 2020-11-07 – 2020-11-15 (×28): 237 mL via ORAL
  Filled 2020-11-07: qty 237

## 2020-11-07 MED ORDER — FENTANYL CITRATE (PF) 100 MCG/2ML IJ SOLN
50.0000 ug | INTRAMUSCULAR | Status: DC | PRN
  Administered 2020-11-07 (×2): 100 ug via INTRAVENOUS
  Filled 2020-11-07 (×3): qty 2

## 2020-11-07 MED ORDER — OXYCODONE HCL 5 MG PO TABS
5.0000 mg | ORAL_TABLET | ORAL | Status: DC | PRN
  Administered 2020-11-07 – 2020-11-09 (×7): 5 mg via ORAL
  Filled 2020-11-07 (×7): qty 1

## 2020-11-07 NOTE — TOC Progression Note (Signed)
Transition of Care [facility]) - Progression Note    Patient Details  Name: Mike [name] MRN: [id number] Date of Birth: 06/07/1961  Transition of Care [facility]) CM/SW [place], [place] Phone Number: 11/07/2020, 9:42 AM  Clinical Narrative:    Pt will be assessed by CIR once medically appropriate. CSW will continue to follow for dc needs.  Expected Discharge Plan: [place] Barriers to Discharge: Continued Medical Work up  Expected Discharge Plan and Services Expected Discharge Plan: [place] arrangements for the past 2 months: Single Family Home                                       Social Determinants of Health (SDOH) Interventions    Readmission Risk Interventions No flowsheet data found.

## 2020-11-07 NOTE — Progress Notes (Signed)
PCA fentanyl discontinued, stopped at 1215. 72ml fentanyl remaining dose wasted by this RN and Aniceto Boss, RN.  Marcille Blanco, RN

## 2020-11-07 NOTE — Progress Notes (Signed)
Patient resting peacefully this morning. Spoke with sister Jeani Hawking over the phone. Continue post-op care, OT/PT.  From cardiac standpoint, he is stable. Renal function has improved. Added Entresto 24-26 mg bid. Will need to monitor renal function closely. Avoid dehydration, hypotension. He is DNR at this time. Do not recommend renewing LifeVest or ICD.    Nigel Mormon, MD Pager: [phone] Office: [phone]

## 2020-11-07 NOTE — Progress Notes (Signed)
Fentanyl PCA injection walked returned to the pharmacy.

## 2020-11-07 NOTE — Progress Notes (Signed)
Inpatient Rehabilitation Admissions Coordinator  Inpatient rehab consult received. I met with patient , his sister and brother at bedside. We discussed goals and expectations of a possible Cir admit. They would like to discuss also with Sw to determine if they would like to pursue CIR vs SNF. I will follow up tomorrow with their decision.  Danne Baxter, RN, MSN Rehab Admissions Coordinator [phone] 11/07/2020 1:50 PM

## 2020-11-07 NOTE — Progress Notes (Signed)
OT Re-evaluation Pt not s/p R AKA. Pt with pain managed with PCA pump, likely contributing with slow processing speed. Pt able to perform bed mobility and to transfer with RW with +2 min assist. Pt requires set up to total assist for ADL, but will likely progress to modified independent with intensive rehab. Recommending CIR.   11/07/20 1100  OT Visit Information  Last OT Received On 11/07/20  Assistance Needed +2  PT/OT/SLP Co-Evaluation/Treatment Yes  OT goals addressed during session ADL's and self-care  History of Present Illness Pt is a 59 year old man admitted on 10/30/20 with SOB and inability to walk with severe L LE pain. Pt with critical L LE ischemia. Underwent L AKA 11/06/20. PMH: STEMI 07/2020 with moderate MR due to torsade post PCI, L LE ischemia with R iliac revascularization, CKD IV, CHF, DM.   Precautions  Precautions Fall;Other (comment)  Precaution Comments sacral wound  Restrictions  Weight Bearing Restrictions Yes  LLE Weight Bearing NWB  Home Living  Family/patient expects to be discharged to: Private residence  Living Arrangements Alone  Available Help at Discharge Family;Available PRN/intermittently  Type of Home Apartment  Home Access Level entry  Home Layout One level  Bathroom Shower/Tub Tub/shower unit  Automotive engineer None  Prior Function  Level of Independence Independent  Gait / Transfers Assistance Needed pt was furniture walking to get to the bathroom   ADL's / Maili Pt has primarily being laying on the couch for the last 2 months. Sister brings in meals periodically  Communication  Communication No difficulties  Pain Assessment  Pain Assessment Faces  Faces Pain Scale 4  Pain Location L LE  Pain Descriptors / Indicators Grimacing;Guarding  Cognition  Arousal/Alertness Awake/alert  Behavior During Therapy WFL for tasks assessed/performed  Overall Cognitive Status Impaired/Different from baseline   Area of Impairment Following commands;Problem solving  Following Commands Follows one step commands with increased time  Problem Solving Slow processing;Decreased initiation;Difficulty sequencing;Requires verbal cues;Requires tactile cues  General Comments like PCA related  Upper Extremity Assessment  Upper Extremity Assessment Overall WFL for tasks assessed  Lower Extremity Assessment  Lower Extremity Assessment Defer to PT evaluation  Cervical / Trunk Assessment  Cervical / Trunk Assessment Normal  ADL  Overall ADL's  Needs assistance/impaired  Eating/Feeding Independent  Grooming Set up;Sitting  Upper Body Bathing Supervision/ safety;Sitting  Lower Body Bathing Total assistance;Sitting/lateral leans  Upper Body Dressing  Supervision/safety;Sitting  Lower Body Dressing Total assistance;Sitting/lateral leans  Toilet Transfer Minimal assistance;+2 for physical assistance;Stand-pivot;BSC;RW  Toileting- Clothing Manipulation and Hygiene Total assistance;+2 for physical assistance;Sit to/from stand  General ADL Comments pt's BP improving with activity  Vision- History  Baseline Vision/History Wears glasses  Wears Glasses Reading only  Patient Visual Report No change from baseline  Bed Mobility  Overal bed mobility Needs Assistance  Bed Mobility Rolling;Sidelying to Sit  Rolling Min assist  Sidelying to sit Min assist  General bed mobility comments cues for technique, use of rail assist for R LE over EOB and to raise trunk, increased time and effort  Transfers  Overall transfer level Needs assistance  Equipment used Rolling walker (2 wheeled)  Transfers Sit to/from [employer] Transfers  Sit to [employer] physical assistance  Stand pivot transfers Min assist;+2 physical assistance  General transfer comment cues for hand placement, assist to rise and steady  Balance  Overall balance assessment Needs assistance  Sitting balance-Leahy Scale Fair  Standing balance  support Bilateral  upper extremity supported  Standing balance-Leahy Scale Poor  Standing balance comment reliant on B UE and external support   OT - End of Session  Equipment Utilized During Treatment Oxygen (2L)  Activity Tolerance Patient tolerated treatment well  Patient left in chair;with call bell/phone within reach;with chair alarm set  Nurse Communication Mobility status  OT Assessment  OT Recommendation/Assessment Patient needs continued OT Services  OT Visit Diagnosis Unsteadiness on feet (R26.81);Other abnormalities of gait and mobility (R26.89);Pain  Pain - Right/Left Left  Pain - part of body Leg  OT Problem List Decreased strength;Decreased activity tolerance;Impaired balance (sitting and/or standing);Decreased knowledge of use of DME or AE;Pain;Cardiopulmonary status limiting activity  OT Plan  OT Frequency (ACUTE ONLY) Min 2X/week  OT Treatment/Interventions (ACUTE ONLY) Self-care/ADL training;DME and/or AE instruction;Patient/family education;Balance training;Therapeutic activities;Energy conservation  AM-PAC OT "6 Clicks" Daily Activity Outcome Measure (Version 2)  Help from another person eating meals? 4  Help from another person taking care of personal grooming? 3  Help from another person toileting, which includes using toliet, bedpan, or urinal? 1  Help from another person bathing (including washing, rinsing, drying)? 2  Help from another person to put on and taking off regular upper body clothing? 3  Help from another person to put on and taking off regular lower body clothing? 1  6 Click Score 14  OT Recommendation  Recommendations for Other Services Rehab consult  Follow Up Recommendations CIR  OT Equipment 3 in 1 bedside commode;Tub/shower bench;Wheelchair (measurements OT);Wheelchair cushion (measurements OT)  Individuals Consulted  Consulted and Agree with Results and Recommendations Patient  Acute Rehab OT Goals  Patient Stated Goal return home  independently  OT Goal Formulation With patient  Time For Goal Achievement 11/21/20  Potential to Achieve Goals Fair  OT Time Calculation  OT Start Time (ACUTE ONLY) 1118  OT Stop Time (ACUTE ONLY) 1151  OT Time Calculation (min) 33 min  OT General Charges  $OT Visit 1 Visit  OT Evaluation  $OT Re-eval 1 Re-eval  Written Expression  Dominant Hand Right  Nestor Lewandowsky, OTR/L Acute Rehabilitation Services Pager: [phone] Office: [phone]

## 2020-11-07 NOTE — Progress Notes (Signed)
[facility]  PROGRESS NOTE  FIN HUPP LFY:[id number] DOB: 12/04/1961 DOA: 10/30/2020 PCP: Patient, No Pcp Per Admit date - 10/30/2020   Admitting Physician Eben Burow, MD  Outpatient Primary MD for the patient is Patient, No Pcp Per  LOS - 8 Brief Narrative   Mike [name] is a 60 year old male with medical history significant for STEMI (8/52/7782) complicated by cardiac arrest secondary to sides post PCI with chronic ST elevations in inferior leads since, severe bilateral renal artery stenosis, PAD with history of critical limb ischemia requiring right iliac revascularization of left lower extremity, chronic systolic CHF related to ischemic cardiomyopathy who presented on 11/2 with complaints of shortness of breath decrease ability to walk due to severe leg pain found to have critical limbischemia of left leg.   Patient has had ongoing issues with atherosclerotic vascular disease of the left leg but given his comorbidities (recent STEMI still requiring aspirin and Brilinta, CKD, depressed EF patient was too high risk for surgical revascularization per vascular surgery, but due to current hospitalization and further decompensation of symptoms after surgery elected to do above-knee amputation to deviate his rest pain.  Patient was transitioned from aspirin Brilinta aspirin and Plavix and underwent left AKA by Dr. Donnetta Hutching 11/9.  Subjective  Today patient states his pain at surgical site is "so-so".  Denies any chest pain or shortness of breath.  He is very sleepy but easily arousable on exam  A & P    Severe PAD with gangrene of left leg with no revascularization options, status post left AKA due to gangrene and intolerable pain/rest pain below/9.  Pain is much better controlled.   -patient  on PCA pump, will discontinue given notable somnolence though  patient still remains easily arousable to avoid respiratory depression -We will continue OxyContin 50 mg every 12 hours -Add  as needed IV fentanyl 50-100 mcg per severe breakthrough pain as needed every 2 hours -Oral oxycodone IR, every 4 hours as needed moderate pain  AKI on CKD stage III, improved.  Occurred in setting of contrast received after peripheral arteriogram.  Creatinine back at baseline -Avoid nephrotoxins  Chronic systolic failure in setting of ischemic cardiomyopathy (recent STEMI in August 2020) history of), euvolemic on exam -Patient on carvedilol 3.125 g twice daily per cardiology -Entresto 24-26 mg daily started by cardiology -Monitor renal function, avoid dehydration/hypotension -Patient is DNR, cardiology does not recommend removing LifeVest or ICD -Continue Corlanor -Continue aspirin and Plavix  AKI on CKD stage III.  Prior to this hospitalization previous creatinine baseline of 1.16.  Peak creatinine this admission of 6.34, has continued to downtrend, now 1.34.  Suspect was prerenal etiology in the setting of NSAIDs and contrast from arteriogram this hospital stay -Avoid nephrotoxins -Monitor BMP  Chronic hypotension.  SBP stable ranging 90s-low 100s.  Seems to be tolerating increase of Coreg started by cardiology a few days ago. -Continue to closely monitor given Entresto being added today -Add blood pressure parameters to hold  Mild anion gap metabolic acidosis likely in the setting of CKD significantly improved currently 22 up from nadir of 11 -Continue sodium bicarb  Type 2 diabetes, A1c 7.1 -Sliding scale insulin as needed, monitor CBGs    Family Communication  : None at bedside  Code Status : DNR  Disposition Plan  :  Patient is from home. Anticipated d/c date: 2 to 3 days likely to SNF versus CIR. Barriers to d/c or necessity for inpatient status:  Close monitoring of blood pressure,  kidney function, able to find a stable pain regimen.requiring IV control Consults  : Nephrology, vascular surgery, cardiology  Procedures  :    DVT Prophylaxis  : Heparin MDM: The below  labs and imaging reports were reviewed and summarized above.  Medication management as above.  Lab Results  Component Value Date   PLT 183 11/07/2020    Diet :  Diet Order            Diet Heart Room service appropriate? Yes; Fluid consistency: Thin  Diet effective now                  Inpatient Medications Scheduled Meds: . acetaminophen  1,000 mg Oral Once  . aspirin EC  81 mg Oral Daily  . carvedilol  3.125 mg Oral BID WC  . clopidogrel  75 mg Oral Daily  . feeding supplement  237 mL Oral BID BM  . Gerhardt's butt cream   Topical BID  . heparin  5,000 Units Subcutaneous Q8H  . insulin aspart  0-6 Units Subcutaneous Q4H  . ivabradine  2.5 mg Oral BID WC  . multivitamin with minerals  1 tablet Oral Daily  . mupirocin ointment  1 application Nasal BID  . nicotine  7 mg Transdermal Q24H  . oxyCODONE  15 mg Oral Q12H  . sacubitril-valsartan  1 tablet Oral BID  . sodium bicarbonate  1,300 mg Oral BID   Continuous Infusions: . sodium chloride 10 mL/hr at 11/02/20 2000   PRN Meds:.sodium chloride, acetaminophen **OR** acetaminophen, fentaNYL (SUBLIMAZE) injection, nitroGLYCERIN, oxyCODONE  Antibiotics  :   Anti-infectives (From admission, onward)   Start     Dose/Rate Route Frequency Ordered Stop   11/06/20 0930  ceFAZolin (ANCEF) IVPB 2g/100 mL premix  Status:  Discontinued        2 g 200 mL/hr over 30 Minutes Intravenous To Surgery 11/06/20 0922 11/06/20 1340       Objective   Vitals:   11/07/20 0500 11/07/20 0854 11/07/20 1128 11/07/20 1139  BP:   (!) 86/51 97/70  Pulse:   68   Resp:  18 20   Temp:   98.2 F (36.8 C)   TempSrc:   Oral   SpO2:  94% 95%   Weight: 65.7 kg     Height:        SpO2: 95 % O2 Flow Rate (L/min): 2 L/min  Wt Readings from Last 3 Encounters:  11/07/20 65.7 kg  10/26/20 69.7 kg  10/22/20 69.9 kg     Intake/Output Summary (Last 24 hours) at 11/07/2020 1510 Last data filed at 11/07/2020 0900 Gross per 24 hour  Intake 600  ml  Output 900 ml  Net -300 ml    Physical Exam:     Somnolent, but easily arousable, alert and oriented x4, normal affect No new F.N deficits,  [place].AT, Normal respiratory effort on room air, CTAB RRR,No Gallops,Rubs or new Murmurs, Left AKA +ve B.Sounds, Abd Soft, No tenderness, No rebound, guarding or rigidity. No Cyanosis, No new Rash or bruise     I have personally reviewed the following:   Data Reviewed:  CBC Recent Labs  Lab 11/01/20 0928 11/02/20 0151 11/03/20 0358 11/07/20 0349 11/07/20 1430  WBC 18.2* 16.3* 15.7* 19.7*  --   HGB 11.4* 10.4* 10.5* 7.4* 7.7*  HCT 34.0* 31.1* 31.9* 23.3* 24.2*  PLT 288 242 248 183  --   MCV 85.2 83.4 86.2 88.3  --   MCH 28.6 27.9 28.4 28.0  --  MCHC 33.5 33.4 32.9 31.8  --   RDW 16.6* 16.4* 16.9* 18.0*  --   LYMPHSABS 0.8 0.8 0.8  --   --   MONOABS 0.9 1.0 0.8  --   --   EOSABS 0.3 0.3 0.1  --   --   BASOSABS 0.0 0.0 0.0  --   --     Chemistries  Recent Labs  Lab 11/03/20 0358 11/04/20 1440 11/05/20 0555 11/06/20 0413 11/07/20 0349  NA 134* 134* 136 137 139  K 4.5 4.8 4.5 4.2 3.7  CL 107 106 108 105 109  CO2 15* 15* 20* 20* 22  GLUCOSE 116* 102* 151* 105* 161*  BUN 110* 96* 91* 80* 58*  CREATININE 3.06* 2.25* 2.11* 1.81* 1.34*  CALCIUM 9.2 9.2 9.0 9.1 9.0   ------------------------------------------------------------------------------------------------------------------ No results for input(s): CHOL, HDL, LDLCALC, TRIG, CHOLHDL, LDLDIRECT in the last 72 hours.  Lab Results  Component Value Date   HGBA1C 7.1 (H) 10/31/2020   ------------------------------------------------------------------------------------------------------------------ No results for input(s): TSH, T4TOTAL, T3FREE, THYROIDAB in the last 72 hours.  Invalid input(s): FREET3 ------------------------------------------------------------------------------------------------------------------ No results for input(s): VITAMINB12, FOLATE,  FERRITIN, TIBC, IRON, RETICCTPCT in the last 72 hours.  Coagulation profile No results for input(s): INR, PROTIME in the last 168 hours.  No results for input(s): DDIMER in the last 72 hours.  Cardiac Enzymes No results for input(s): CKMB, TROPONINI, MYOGLOBIN in the last 168 hours.  Invalid input(s): CK ------------------------------------------------------------------------------------------------------------------    Component Value Date/Time   BNP 488.4 (H) 10/30/2020 1820    Micro Results Recent Results (from the past 240 hour(s))  Respiratory Panel by RT PCR (Flu A&B, Covid) - Nasopharyngeal Swab     Status: None   Collection Time: 10/30/20  7:51 PM   Specimen: Nasopharyngeal Swab  Result Value Ref Range Status   SARS Coronavirus 2 by RT PCR NEGATIVE NEGATIVE Final    Comment: (NOTE) SARS-CoV-2 target nucleic acids are NOT DETECTED.  The SARS-CoV-2 RNA is generally detectable in upper respiratoy specimens during the acute phase of infection. The lowest concentration of SARS-CoV-2 viral copies this assay can detect is 131 copies/mL. A negative result does not preclude SARS-Cov-2 infection and should not be used as the sole basis for treatment or other patient management decisions. A negative result may occur with  improper specimen collection/handling, submission of specimen other than nasopharyngeal swab, presence of viral mutation(s) within the areas targeted by this assay, and inadequate number of viral copies (<131 copies/mL). A negative result must be combined with clinical observations, patient history, and epidemiological information. The expected result is Negative.  Fact Sheet for Patients:  PinkCheek.be  Fact Sheet for Healthcare Providers:  GravelBags.it  This test is no t yet approved or cleared by the [place] FDA and  has been authorized for detection and/or diagnosis of SARS-CoV-2 by FDA  under an Emergency Use Authorization (EUA). This EUA will remain  in effect (meaning this test can be used) for the duration of the COVID-19 declaration under Section 564(b)(1) of the Act, 21 U.S.C. section 360bbb-3(b)(1), unless the authorization is terminated or revoked sooner.     Influenza A by PCR NEGATIVE NEGATIVE Final   Influenza B by PCR NEGATIVE NEGATIVE Final    Comment: (NOTE) The Xpert Xpress SARS-CoV-2/FLU/RSV assay is intended as an aid in  the diagnosis of influenza from Nasopharyngeal swab specimens and  should not be used as a sole basis for treatment. Nasal washings and  aspirates are unacceptable for Xpert Xpress SARS-CoV-2/FLU/RSV  testing.  Fact Sheet for Patients: PinkCheek.be  Fact Sheet for Healthcare Providers: GravelBags.it  This test is not yet approved or cleared by the [place] FDA and  has been authorized for detection and/or diagnosis of SARS-CoV-2 by  FDA under an Emergency Use Authorization (EUA). This EUA will remain  in effect (meaning this test can be used) for the duration of the  Covid-19 declaration under Section 564(b)(1) of the Act, 21  U.S.C. section 360bbb-3(b)(1), unless the authorization is  terminated or revoked. Performed at [facility], [place] [address], [place], [place] [address]   Culture, blood (x 2)     Status: None   Collection Time: 10/30/20  9:41 PM   Specimen: BLOOD  Result Value Ref Range Status   Specimen Description BLOOD RIGHT ANTECUBITAL  Final   Special Requests   Final    BOTTLES DRAWN AEROBIC AND ANAEROBIC Blood Culture adequate volume   Culture   Final    NO GROWTH 5 DAYS Performed at [facility], [place] [address], [place], [place] [address]    Report Status 11/04/2020 FINAL  Final  Culture, blood (x 2)     Status: None   Collection Time: 10/31/20  1:20 AM   Specimen: BLOOD  Result Value Ref Range Status   Specimen Description BLOOD  RIGHT ANTECUBITAL  Final   Special Requests   Final    BOTTLES DRAWN AEROBIC AND ANAEROBIC Blood Culture adequate volume   Culture   Final    NO GROWTH 5 DAYS Performed at [facility], [place] [address] [place], [place] [address]    Report Status 11/05/2020 FINAL  Final  Surgical PCR screen     Status: None   Collection Time: 11/06/20  2:40 AM   Specimen: Nasal Mucosa; Nasal Swab  Result Value Ref Range Status   MRSA, PCR NEGATIVE NEGATIVE Final   Staphylococcus aureus NEGATIVE NEGATIVE Final    Comment: (NOTE) The Xpert SA Assay (FDA approved for NASAL specimens in patients 8 years of age and older), is one component of a comprehensive surveillance program. It is not intended to diagnose infection nor to guide or monitor treatment. Performed at [facility], [place] [address] [place], [place] [address]     Radiology Reports US RENAL  Result Date: 10/17/2020 CLINICAL DATA:  Acute kidney injury EXAM: RENAL / URINARY TRACT ULTRASOUND COMPLETE COMPARISON:  None. FINDINGS: Right Kidney: Renal measurements: 11.8 x 5.6 x 5.7 cm = volume: 197.9 mL. Echogenicity and renal cortical thickness are within normal limits. No mass, perinephric fluid, or hydronephrosis visualized. No sonographically demonstrable calculus or ureterectasis. Left Kidney: Renal measurements: 10.7 x 4.7 x 4.1 cm = volume: 109.5 mL. Echogenicity and renal cortical thickness are within normal limits. No perinephric fluid or hydronephrosis visualized. There is a cyst arising from the lower pole left kidney measuring 1.8 x 1.4 x 1.5 cm. No sonographically demonstrable calculus or ureterectasis. Bladder: Appears normal for degree of bladder distention. Other: None. IMPRESSION: Cyst arising from lower pole left kidney measuring 1.8 x 1.4 x 1.5 cm. Study otherwise unremarkable. Electronically Signed   By: Lowella Grip III M.D.   On: 10/17/2020 15:28   PERIPHERAL VASCULAR CATHETERIZATION  Result Date:  10/16/2020 Successful intravascular lithotripsy, PTCA and stenting     7.0X39 mm balloon expandable Viabahn VBX stent Rt common iliac artery     7.0X60 mm self expanding Absolute Pro stent Rt distal iliac artery No named vessels at ankle  level in both lower extremities. Will consult vascular surgery for Rt fem-to-left profunda bypass given left foot critical limb ischemia. Nigel Mormon, MD Pager: [phone] Office: [phone]   DG Chest Portable 1 View  Result Date: 10/30/2020 CLINICAL DATA:  Chest pain EXAM: PORTABLE CHEST 1 VIEW COMPARISON:  08/03/2020 FINDINGS: Single frontal view of the chest demonstrates stenting artifact from external cardiac device. Cardiac silhouette is unremarkable. No airspace disease, effusion, or pneumothorax. No acute bony abnormalities. IMPRESSION: 1. No acute intrathoracic process. Electronically Signed   By: Randa Ngo M.D.   On: 10/30/2020 17:01   VAS US CAROTID  Result Date: 10/17/2020 Carotid Arterial Duplex Study Indications:  Pre-surgical evaluation. Risk Factors: Hypertension, hyperlipidemia, current smoker, coronary artery               disease, PAD. Performing Technologist: Maudry Mayhew MHA, RDMS, RVT, RDCS  Examination Guidelines: A complete evaluation includes B-mode imaging, spectral Doppler, color Doppler, and power Doppler as needed of all accessible portions of each vessel. Bilateral testing is considered an integral part of a complete examination. Limited examinations for reoccurring indications may be performed as noted.  Right Carotid Findings: +----------+--------+--------+--------+-----------------------+--------+           PSV cm/sEDV cm/sStenosisPlaque Description     Comments +----------+--------+--------+--------+-----------------------+--------+ CCA Prox  186                     smooth and heterogenous         +----------+--------+--------+--------+-----------------------+--------+ CCA Distal87      16               smooth and heterogenous         +----------+--------+--------+--------+-----------------------+--------+ ICA Prox  56      13              smooth and heterogenous         +----------+--------+--------+--------+-----------------------+--------+ ICA Distal70      15                                              +----------+--------+--------+--------+-----------------------+--------+ ECA       139     7               smooth and heterogenous         +----------+--------+--------+--------+-----------------------+--------+ +----------+--------+-------+----------------+-------------------+           PSV cm/sEDV cmsDescribe        Arm Pressure (mmHG) +----------+--------+-------+----------------+-------------------+ [id number]            Multiphasic, WNL                    +----------+--------+-------+----------------+-------------------+ +---------+--------+--+--------+--+---------+ VertebralPSV cm/s78EDV cm/s29Antegrade +---------+--------+--+--------+--+---------+  Left Carotid Findings: +----------+--------+--------+--------+-----------------------+--------+           PSV cm/sEDV cm/sStenosisPlaque Description     Comments +----------+--------+--------+--------+-----------------------+--------+ CCA Prox  207     10                                              +----------+--------+--------+--------+-----------------------+--------+ CCA [id number]     14              smooth and heterogenous         +----------+--------+--------+--------+-----------------------+--------+ ICA Prox  81  17              smooth and heterogenous         +----------+--------+--------+--------+-----------------------+--------+ ICA Distal109     29                                              +----------+--------+--------+--------+-----------------------+--------+ ECA       240     14              smooth and heterogenous          +----------+--------+--------+--------+-----------------------+--------+ +----------+--------+--------+--------+-------------------+           PSV cm/sEDV cm/sDescribeArm Pressure (mmHG) +----------+--------+--------+--------+-------------------+ Subclavian310                                         +----------+--------+--------+--------+-------------------+ +---------+--------+---+--------+--+---------------------------------+ VertebralPSV cm/s293EDV cm/s47>50% cervical stenosis, antegrade +---------+--------+---+--------+--+---------------------------------+   Summary: Right Carotid: Velocities in the right ICA are consistent with a 1-39% stenosis. Left Carotid: Velocities in the left ICA are consistent with a 1-39% stenosis. Vertebrals:  Bilateral vertebral arteries demonstrate antegrade flow. Left >50%              cervical stenosis. Subclavians: Normal flow hemodynamics were seen in bilateral subclavian              arteries. *See table(s) above for measurements and observations.  Electronically signed by Harold Barban MD on 10/17/2020 at 7:38:49 PM.    Final      Time Spent in minutes  30     Desiree Hane M.D on 11/07/2020 at 3:10 PM  To page go to [email or web address] - password [facility]

## 2020-11-07 NOTE — TOC Progression Note (Signed)
Transition of Care [facility]) - Progression Note    Patient Details  Name: Mike [name] MRN: [id number] Date of Birth: 1961/01/02  Transition of [facility]) CM/SW [place], [place] Phone Number: 11/07/2020, 3:14 PM  Clinical Narrative:    CSW met with family at bedside, asked questions about SNF vs. CIR. Pt's sister stated she believes pt may do better at SNF. CSW relayed that at this time that only [facility] has accepted the pt. Family requested more information, CSW asked [facility] to call pt. CSW reached out to [facility] to see if they could reconsider taking pt, waiting on response. CSW will continue to follow.    Expected Discharge Plan: [place] Barriers to Discharge: Continued Medical Work up  Expected Discharge Plan and Services Expected Discharge Plan: [place] arrangements for the past 2 months: Single Family Home                                       Social Determinants of Health (SDOH) Interventions    Readmission Risk Interventions No flowsheet data found.

## 2020-11-07 NOTE — Progress Notes (Signed)
Nutrition Follow-up  DOCUMENTATION CODES:   Not applicable  INTERVENTION:   -D/c Ensure Enlive po BID, each supplement provides 350 kcal and 20 grams of protein -Glucerna Shake po TID, each supplement provides 220 kcal and 10 grams of protein -Continue MVI with minerals daily  NUTRITION DIAGNOSIS:   Increased nutrient needs related to wound healing as evidenced by estimated needs.  Ongoing  GOAL:   Patient will meet greater than or equal to 90% of their needs  Progressing   MONITOR:   PO intake, Supplement acceptance, Weight trends, Labs, Skin, I & O's  REASON FOR ASSESSMENT:   Consult Diet education  ASSESSMENT:   59 y.o. male with medical history significant of CAD, culprit (RCA) and nonculprit (LCx) PCI after STEMI 07/2020, HFrEF, moderate MR, h/o cardiac arrest 2/2 [place] post PPCI during index hospitalization, PAD with critical limb ischemia  11/10- s/p lt AKA  Reviewed I/O's: -1.2 L x 24 hours and -4.9 L since admission  UOP: 2.3 L x 24 hours  Spoke with pt at bedside, who was pleasant and in good spirits today. He reports that he has a fair appetite and is trying to eat what he can, but does not find food appealing. He shares that this has been ongoing for about 3 months, after experiencing a heart attack in August. Pt reports that he is hungry for food, however, the desire goes away once he sits down and tries to eat. He estimates he has been eating about half of his usual intake over this time frame.   Prior to illness, pt had good appetite and consumed 3 meals per day (Breakfast: toast, eggs, and sausage; Lunch and dinner: grilled chicken and vegetables OR pot roast). Pt lives alone but makes most of his meals in a crock pot.   Reviewed wt hx;pt has experienced a 18.4% wt loss over the past 3 months, which is significant for time frame. Pt shares he has lost about 30# over the past 3 months. His UBW is around 170#.   Discussed with pt importance of good  glycemic control as well as meal and supplement intake to promote healing. Pt enjoys Glucerna supplements and has been consuming 3-4 per day.   Labs reviewed: CBGS: 136-148 (inpatient orders for glycemic control are 0-6 units insulin aspart every 4 hours).   NUTRITION - FOCUSED PHYSICAL EXAM:    Most Recent Value  Orbital Region No depletion  Upper Arm Region Mild depletion  Thoracic and Lumbar Region Mild depletion  Buccal Region No depletion  Temple Region Mild depletion  Clavicle Bone Region No depletion  Clavicle and Acromion Bone Region No depletion  Scapular Bone Region Unable to assess  Dorsal Hand Mild depletion  Patellar Region Mild depletion  Anterior Thigh Region Mild depletion  Posterior Calf Region Mild depletion  Edema (RD Assessment) None  Hair Reviewed  Eyes Reviewed  Mouth Reviewed  Skin Reviewed  Nails Reviewed       Diet Order:   Diet Order            Diet Heart Room service appropriate? Yes; Fluid consistency: Thin  Diet effective now                 EDUCATION NEEDS:   No education needs have been identified at this time  Skin:  Skin Assessment: Skin Integrity Issues: Skin Integrity Issues:: Incisions Incisions: s/p lt AKA Other: -  Last BM:  11/04/20  Height:   Ht Readings from Last 1 Encounters:  10/31/20 6' (1.829 m)    Weight:   Wt Readings from Last 1 Encounters:  11/07/20 65.7 kg    Ideal Body Weight:  74.4 kg  BMI:  Body mass index is 19.64 kg/m.  Estimated Nutritional Needs:   Kcal:  2150-2350  Protein:  120-135 grams  Fluid:  > 2 L    Loistine Chance, RD, LDN, [place] Registered Dietitian II Certified Diabetes Care and Education Specialist Please refer to [facility] for RD and/or RD on-call/weekend/after hours pager

## 2020-11-07 NOTE — Progress Notes (Addendum)
  Progress Note    11/07/2020 7:30 AM 1 Day Post-Op  Subjective: says pain overall tolerable. Feels better than yesterday   Vitals:   11/07/20 0400 11/07/20 0449  BP:  (!) 119/51  Pulse:  74  Resp: 15 18  Temp:  98.9 F (37.2 C)  SpO2: 93% 96%   Physical Exam: Cardiac: regular Lungs:  Non labored Incisions: Left AKA dressings clean dry and intact Extremities:  RLE well perfused and warm Neurologic: alert and oriented  CBC    Component Value Date/Time   WBC 15.7 (H) 11/03/2020 0358   RBC 3.70 (L) 11/03/2020 0358   HGB 10.5 (L) 11/03/2020 0358   HCT 31.9 (L) 11/03/2020 0358   PLT 248 11/03/2020 0358   MCV 86.2 11/03/2020 0358   MCV 85.0 03/16/2016 1642   MCH 28.4 11/03/2020 0358   MCHC 32.9 11/03/2020 0358   RDW 16.9 (H) 11/03/2020 0358   LYMPHSABS 0.8 11/03/2020 0358   MONOABS 0.8 11/03/2020 0358   EOSABS 0.1 11/03/2020 0358   BASOSABS 0.0 11/03/2020 0358    BMET    Component Value Date/Time   NA 139 11/07/2020 0349   NA 135 10/26/2020 1004   K 3.7 11/07/2020 0349   CL 109 11/07/2020 0349   CO2 22 11/07/2020 0349   GLUCOSE 161 (H) 11/07/2020 0349   BUN 58 (H) 11/07/2020 0349   BUN 79 (HH) 10/26/2020 1004   CREATININE 1.34 (H) 11/07/2020 0349   CALCIUM 9.0 11/07/2020 0349   GFRNONAA >60 11/07/2020 0349   GFRAA 12 (L) 10/26/2020 1004    INR    Component Value Date/Time   INR 1.2 10/30/2020 1822     Intake/Output Summary (Last 24 hours) at 11/07/2020 0730 Last data filed at 11/07/2020 0600 Gross per 24 hour  Intake 1230 ml  Output 2400 ml  Net -1170 ml     Assessment/Plan:  59 y.o. male is s/p left AKA 1 Day Post-Op. Doing well post op. Pain well controlled. Dressings intact and dry. Dressing take down tomorrow. H&H pending. PT/ OT eval today  DVT prophylaxis: sq heparin   Karoline Caldwell, PA-C [facility] [phone] 11/07/2020 7:30 AM

## 2020-11-07 NOTE — Progress Notes (Signed)
Physical Therapy Treatment Patient Details Name: Mike [name] MRN: [id number] DOB: Mar 14, 1961 Today's Date: 11/07/2020    History of Present Illness 59 year old man admitted on 10/30/20 with SOB and inability to walk with severe L LE pain. Pt with critical L LE ischemia. Underwent L AKA 11/06/20. PMH: STEMI 07/2020 with moderate MR due to torsade post PCI, L LE ischemia with R iliac revascularization, CKD IV, CHF, DM.     PT Comments    Pt was seen for post op visit with OT in attendance, noted pt is able to help with transfers to stand and pivot with two needed to pivot.  He is light headed but cleared O2 sats and BP as causes.  Lowest BP sitting was 100/50, then standing was 97/67 and after transfer to chair was 102/67.  Sats were in the 90's while moving, lowest was 89% where pt may have been holding his breath.  Follow acutely for the strengthening and balance, gait skills as are in POC and work toward CIR discharge.  Pt is very motivated and has good upper body strength to make a recovery of mobility.   Follow Up Recommendations  CIR     Equipment Recommendations  None recommended by PT    Recommendations for Other Services Rehab consult     Precautions / Restrictions Precautions Precautions: Fall Precaution Comments: sacral wound Restrictions Weight Bearing Restrictions: Yes LLE Weight Bearing: Non weight bearing    Mobility  Bed Mobility Overal bed mobility: Needs Assistance Bed Mobility: Rolling;Supine to Sit Rolling: Min assist Sidelying to sit: Min assist Supine to sit: Min assist     General bed mobility comments: hand placemetn wiht light help to finish pivot to side of bed  Transfers Overall transfer level: Needs assistance Equipment used: Rolling walker (2 wheeled);1 person hand held assist;2 person hand held assist Transfers: Sit to/from [facility] Sit to Stand: Mod assist;From elevated surface Stand pivot transfers: Min assist;+2  physical assistance;+2 safety/equipment;From elevated surface       General transfer comment: mod to stand initially with just PT then Min of two to use RW  Ambulation/Gait             General Gait Details: did not perform   Stairs             Wheelchair Mobility    Modified Rankin (Stroke Patients Only)       Balance Overall balance assessment: Needs assistance   Sitting balance-Leahy Scale: Fair     Standing balance support: Bilateral upper extremity supported;During functional activity Standing balance-Leahy Scale: Poor Standing balance comment: requires help of walker and is mainly sliding R foot                            Cognition Arousal/Alertness: Awake/alert Behavior During Therapy: WFL for tasks assessed/performed Overall Cognitive Status: Impaired/Different from baseline Area of Impairment: Problem solving;Safety/judgement;Following commands                       Following Commands: Follows one step commands inconsistently;Follows one step commands with increased time Safety/Judgement: Decreased awareness of safety;Decreased awareness of deficits   Problem Solving: Slow processing;Requires verbal cues General Comments: pain meds causing but also light headed      Exercises      General Comments General comments (skin integrity, edema, etc.): Pt is up to side of bed with attempted direct pivot making pt light  headed, so used RW with 2 person help.  BP is low but not prohibitive of  getting up to stand      Pertinent Vitals/Pain Pain Assessment: Faces Faces Pain Scale: Hurts little more Pain Location: L LE Pain Descriptors / Indicators: Operative site guarding    Home Living Family/patient expects to be discharged to:: Private residence Living Arrangements: Alone Available Help at Discharge: Family;Available PRN/intermittently Type of Home: Apartment Home Access: Level entry   Home Layout: One level Home  Equipment: None      Prior Function Level of Independence: Independent  Gait / Transfers Assistance Needed: pt was furniture walking to get to the bathroom  ADL's / Homemaking Assistance Needed: Pt has primarily being laying on the couch for the last 2 months. Sister brings in meals periodically     PT Goals (current goals can now be found in the care plan section) Acute Rehab PT Goals Patient Stated Goal: return home independently Progress towards PT goals: Progressing toward goals    Frequency    Min 3X/week      PT Plan Discharge plan needs to be updated    Co-evaluation PT/OT/SLP Co-Evaluation/Treatment: Yes Reason for Co-Treatment: For patient/therapist safety;To address functional/ADL transfers   OT goals addressed during session: ADL's and self-care      AM-PAC PT "6 Clicks" Mobility   Outcome Measure  Help needed turning from your back to your side while in a flat bed without using bedrails?: None Help needed moving from lying on your back to sitting on the side of a flat bed without using bedrails?: A Little Help needed moving to and from a bed to a chair (including a wheelchair)?: A Little Help needed standing up from a chair using your arms (e.g., wheelchair or bedside chair)?: A Lot Help needed to walk in hospital room?: A Lot Help needed climbing 3-5 steps with a railing? : Total 6 Click Score: 15    End of Session Equipment Utilized During Treatment: Oxygen (no non rebreather, sats 89% to 99%) Activity Tolerance: Patient limited by fatigue;Patient limited by pain;Treatment limited secondary to medical complications (Comment) Patient left: in chair;with call bell/phone within reach;with chair alarm set Nurse Communication: Mobility status;Other (comment) (talked with nursing about sliding back to bed) PT Visit Diagnosis: Pain;Difficulty in walking, not elsewhere classified (R26.2);Other abnormalities of gait and mobility (R26.89);Unsteadiness on feet  (R26.81);Muscle weakness (generalized) (M62.81) Pain - Right/Left: Left Pain - part of body: Leg     Time: 0962-8366 PT Time Calculation (min) (ACUTE ONLY): 30 min  Charges:  $Therapeutic Activity: 8-22 mins                   Ramond Dial 11/07/2020, 12:17 PM  Mee Hives, PT MS Acute Rehab Dept. Number: [place] and [place]

## 2020-11-08 ENCOUNTER — Inpatient Hospital Stay (HOSPITAL_COMMUNITY): Payer: BC Managed Care – PPO

## 2020-11-08 DIAGNOSIS — I959 Hypotension, unspecified: Secondary | ICD-10-CM | POA: Diagnosis not present

## 2020-11-08 DIAGNOSIS — E782 Mixed hyperlipidemia: Secondary | ICD-10-CM | POA: Diagnosis not present

## 2020-11-08 DIAGNOSIS — I5023 Acute on chronic systolic (congestive) heart failure: Secondary | ICD-10-CM | POA: Diagnosis not present

## 2020-11-08 DIAGNOSIS — I70229 Atherosclerosis of native arteries of extremities with rest pain, unspecified extremity: Secondary | ICD-10-CM | POA: Diagnosis not present

## 2020-11-08 LAB — CBC WITH DIFFERENTIAL/PLATELET
Abs Immature Granulocytes: 0.15 10*3/uL — ABNORMAL HIGH (ref 0.00–0.07)
Basophils Absolute: 0 10*3/uL (ref 0.0–0.1)
Basophils Relative: 0 %
Eosinophils Absolute: 0.3 10*3/uL (ref 0.0–0.5)
Eosinophils Relative: 2 %
HCT: 22.9 % — ABNORMAL LOW (ref 39.0–52.0)
Hemoglobin: 7.4 g/dL — ABNORMAL LOW (ref 13.0–17.0)
Immature Granulocytes: 1 %
Lymphocytes Relative: 9 %
Lymphs Abs: 1.3 10*3/uL (ref 0.7–4.0)
MCH: 28.6 pg (ref 26.0–34.0)
MCHC: 32.3 g/dL (ref 30.0–36.0)
MCV: 88.4 fL (ref 80.0–100.0)
Monocytes Absolute: 1 10*3/uL (ref 0.1–1.0)
Monocytes Relative: 7 %
Neutro Abs: 11.4 10*3/uL — ABNORMAL HIGH (ref 1.7–7.7)
Neutrophils Relative %: 81 %
Platelets: 197 10*3/uL (ref 150–400)
RBC: 2.59 MIL/uL — ABNORMAL LOW (ref 4.22–5.81)
RDW: 18.1 % — ABNORMAL HIGH (ref 11.5–15.5)
WBC: 14.1 10*3/uL — ABNORMAL HIGH (ref 4.0–10.5)
nRBC: 0 % (ref 0.0–0.2)

## 2020-11-08 LAB — GLUCOSE, CAPILLARY
Glucose-Capillary: 110 mg/dL — ABNORMAL HIGH (ref 70–99)
Glucose-Capillary: 112 mg/dL — ABNORMAL HIGH (ref 70–99)
Glucose-Capillary: 115 mg/dL — ABNORMAL HIGH (ref 70–99)
Glucose-Capillary: 119 mg/dL — ABNORMAL HIGH (ref 70–99)
Glucose-Capillary: 120 mg/dL — ABNORMAL HIGH (ref 70–99)
Glucose-Capillary: 162 mg/dL — ABNORMAL HIGH (ref 70–99)

## 2020-11-08 LAB — RENAL FUNCTION PANEL
Albumin: 2.2 g/dL — ABNORMAL LOW (ref 3.5–5.0)
Anion gap: 6 (ref 5–15)
BUN: 49 mg/dL — ABNORMAL HIGH (ref 8–23)
CO2: 26 mmol/L (ref 22–32)
Calcium: 8.7 mg/dL — ABNORMAL LOW (ref 8.9–10.3)
Chloride: 108 mmol/L (ref 98–111)
Creatinine, Ser: 1.29 mg/dL — ABNORMAL HIGH (ref 0.61–1.24)
GFR, Estimated: >60 mL/min (ref >60)
Glucose, Bld: 114 mg/dL — ABNORMAL HIGH (ref 70–99)
Phosphorus: 2.6 mg/dL (ref 2.5–4.6)
Potassium: 4.2 mmol/L (ref 3.5–5.1)
Sodium: 140 mmol/L (ref 135–145)

## 2020-11-08 MED ORDER — ROSUVASTATIN CALCIUM 5 MG PO TABS
10.0000 mg | ORAL_TABLET | Freq: Every day | ORAL | Status: DC
  Administered 2020-11-08 – 2020-11-15 (×8): 10 mg via ORAL
  Filled 2020-11-08 (×8): qty 2

## 2020-11-08 MED ORDER — FENTANYL CITRATE (PF) 100 MCG/2ML IJ SOLN: 50.0000 ug | INTRAMUSCULAR | Status: DC | PRN

## 2020-11-08 MED ORDER — OXYCODONE HCL ER 10 MG PO T12A
10.0000 mg | EXTENDED_RELEASE_TABLET | Freq: Two times a day (BID) | ORAL | Status: DC
  Administered 2020-11-08 – 2020-11-09 (×3): 10 mg via ORAL
  Filled 2020-11-08 (×3): qty 1

## 2020-11-08 MED ORDER — GABAPENTIN 300 MG PO CAPS
300.0000 mg | ORAL_CAPSULE | Freq: Two times a day (BID) | ORAL | Status: DC
  Administered 2020-11-08 – 2020-11-15 (×15): 300 mg via ORAL
  Filled 2020-11-08 (×15): qty 1

## 2020-11-08 NOTE — Progress Notes (Addendum)
  Progress Note    11/08/2020 8:00 AM 2 Days Post-Op  Subjective:  No complaints   Vitals:   11/08/20 0044 11/08/20 0405  BP: (!) 95/51 118/63  Pulse: 70 69  Resp: 16 18  Temp: 99.1 F (37.3 C) 98.6 F (37 C)  SpO2: 94% 97%   Physical Exam: Lungs:  Non labored Incisions:  L AKA incision c/d/i Abdomen:  Soft, NT Neurologic: A&O  CBC    Component Value Date/Time   WBC 14.1 (H) 11/08/2020 0213   RBC 2.59 (L) 11/08/2020 0213   HGB 7.4 (L) 11/08/2020 0213   HCT 22.9 (L) 11/08/2020 0213   PLT 197 11/08/2020 0213   MCV 88.4 11/08/2020 0213   MCV 85.0 03/16/2016 1642   MCH 28.6 11/08/2020 0213   MCHC 32.3 11/08/2020 0213   RDW 18.1 (H) 11/08/2020 0213   LYMPHSABS 1.3 11/08/2020 0213   MONOABS 1.0 11/08/2020 0213   EOSABS 0.3 11/08/2020 0213   BASOSABS 0.0 11/08/2020 0213    BMET    Component Value Date/Time   NA 140 11/08/2020 0213   NA 135 10/26/2020 1004   K 4.2 11/08/2020 0213   CL 108 11/08/2020 0213   CO2 26 11/08/2020 0213   GLUCOSE 114 (H) 11/08/2020 0213   BUN 49 (H) 11/08/2020 0213   BUN 79 (HH) 10/26/2020 1004   CREATININE 1.29 (H) 11/08/2020 0213   CALCIUM 8.7 (L) 11/08/2020 0213   GFRNONAA >60 11/08/2020 0213   GFRAA 12 (L) 10/26/2020 1004    INR    Component Value Date/Time   INR 1.2 10/30/2020 1822     Intake/Output Summary (Last 24 hours) at 11/08/2020 0800 Last data filed at 11/08/2020 0400 Gross per 24 hour  Intake 240 ml  Output 1350 ml  Net -1110 ml     Assessment/Plan:  59 y.o. male is s/p L AKA 2 Days Post-Op   Dressing down this morning; incision unremarkable Retention sock ordered [facility] for discharge when placement arranged    Dagoberto Ligas, PA-C [facility] [phone] 11/08/2020 8:00 AM  I agree with the above.  I have seen and evaluated patient.  His amputation site is healing nicely.  He is complaining of significant phantom pain.  I will order Neurontin.  Annamarie Major

## 2020-11-08 NOTE — Progress Notes (Signed)
Patient's bladder scan volume was 982. Patient is alert and oriented x 4 and was educated on why the in and out needed to happen. Patient verbalize understanding. Performed in and out on patient. Retrieved 523ml. Patient verbalizes feeling "really uncomfortable and hurting". Performed bladder scan and showed 4104ml retention. Spoke to S. Nettey MD on the unit. MD is aware. Renal US scan ordered.

## 2020-11-08 NOTE — Progress Notes (Signed)
Inpatient Rehabilitation Admissions Coordinator  I met with sister at bedside. They have elected SNF rather than Cir. We will sign off at this time.  Danne Baxter, RN, MSN Rehab Admissions Coordinator [phone] 11/08/2020 10:18 AM

## 2020-11-08 NOTE — TOC Progression Note (Signed)
Transition of Care [facility]) - Progression Note    Patient Details  Name: DETRELL UMSCHEID MRN: [id number] Date of Birth: June 05, 1961  Transition of Care [facility]) CM/SW [place], [place] Phone Number: 11/08/2020, 12:35 PM  Clinical Narrative:    Pt and family have elected to SNF placement instead of CIR. Pt has chosen [facility]. Josem Kaufmann has been started by SNF.   Expected Discharge Plan: [place] Barriers to Discharge: Continued Medical Work up  Expected Discharge Plan and Services Expected Discharge Plan: [place] arrangements for the past 2 months: Single Family Home                                       Social Determinants of Health (SDOH) Interventions    Readmission Risk Interventions No flowsheet data found.

## 2020-11-08 NOTE — Progress Notes (Signed)
Nutrition Follow-up  DOCUMENTATION CODES:   Not applicable  INTERVENTION:   -Continue Glucerna Shake po QID, each supplement provides 220 kcal and 10 grams of protein -Continue MVI with minerals daily  NUTRITION DIAGNOSIS:   Moderate Malnutrition related to chronic illness (CAD, PAD) as evidenced by mild fat depletion, mild muscle depletion, percent weight loss.  Ongoing  GOAL:   Patient will meet greater than or equal to 90% of their needs  Progressing   MONITOR:   PO intake, Supplement acceptance, Weight trends, Labs, Skin, I & O's  REASON FOR ASSESSMENT:   Consult Diet education  ASSESSMENT:   59 y.o. male with medical history significant of CAD, culprit (RCA) and nonculprit (LCx) PCI after STEMI 07/2020, HFrEF, moderate MR, h/o cardiac arrest 2/2 [place] post PPCI during index hospitalization, PAD with critical limb ischemia  11/10- s/p lt AKA  Reviewed I/O's: -1.1 L x 24 hours and -6 L since admission  UOP: 1.4 L x 24 hours  Attempted to speak with pt via phone call to hospital room phone, however, no answer.   Pt assessed by RD yesterday (11/07/20). Pt with decreased oral intake and really enjoys drinking Glucerna supplements. Due to variable oral intake, supplements were increased to QID yesterday to help support post-operative healing.   Per TOC notes, plan for SNF placement at discharge.  Labs reviewed: CBGS: 110-119 (inpatient orders for glycemic control are 0-6 units insulin aspart every 4 hours).   Diet Order:   Diet Order            Diet Heart Room service appropriate? Yes; Fluid consistency: Thin  Diet effective now                 EDUCATION NEEDS:   Education needs have been addressed  Skin:  Skin Assessment: Skin Integrity Issues: Skin Integrity Issues:: Incisions Incisions: s/p lt AKA Other: -  Last BM:  11/04/20  Height:   Ht Readings from Last 1 Encounters:  10/31/20 6' (1.829 m)    Weight:   Wt Readings from Last 1  Encounters:  11/08/20 64.8 kg    Ideal Body Weight:  74.4 kg  BMI:  Body mass index is 19.38 kg/m.  Estimated Nutritional Needs:   Kcal:  2150-2350  Protein:  120-135 grams  Fluid:  > 2 L    Loistine Chance, RD, LDN, [place] Registered Dietitian II Certified Diabetes Care and Education Specialist Please refer to [facility] for RD and/or RD on-call/weekend/after hours pager

## 2020-11-08 NOTE — Progress Notes (Signed)
[facility]  PROGRESS NOTE  Mike [name] CVE:[id number] DOB: [birth year] DOA: 10/30/2020 PCP: Patient, No Pcp Per Admit date - 10/30/2020   Admitting Physician Eben Burow, MD  Outpatient Primary MD for the patient is Patient, No Pcp Per  LOS - 9 Brief Narrative   Mike [name] is a 59 year old male with medical history significant for STEMI (0/25/8527) complicated by cardiac arrest secondary to sides post PCI with chronic ST elevations in inferior leads since, severe bilateral renal artery stenosis, PAD with history of critical limb ischemia requiring right iliac revascularization of left lower extremity, chronic systolic CHF related to ischemic cardiomyopathy who presented on 11/2 with complaints of shortness of breath decrease ability to walk due to severe leg pain found to have critical limbischemia of left leg.   Patient has had ongoing issues with atherosclerotic vascular disease of the left leg but given his comorbidities (recent STEMI still requiring aspirin and Brilinta, CKD, depressed EF patient was too high risk for surgical revascularization per vascular surgery, but due to current hospitalization and further decompensation of symptoms after surgery elected to do above-knee amputation to deviate his rest pain.  Patient was transitioned from aspirin Brilinta aspirin and Plavix and underwent left AKA by Dr. Donnetta Hutching 11/9.  Subjective  Today feels foggy after pain medication. Complained of worsening phantom pain to vascular team.   A & P    Severe PAD with gangrene of left leg with no revascularization options, status post left AKA due to gangrene and intolerable pain/rest pain below/9.  Pain is much better controlled.  OFF PCA pump  -Wean oxycontin to 10 mg q 12 HRs -wean IV fentanyl to 50 mcg per severe breakthrough pain as needed every 2 hours -Oral oxycodone IR, every 4 hours as needed moderate pain  AKI on CKD stage III, improved.  Occurred in setting of contrast  received after peripheral arteriogram.  Creatinine back at baseline -Avoid nephrotoxins  Chronic systolic failure in setting of ischemic cardiomyopathy (recent STEMI in August 2020) history of), euvolemic on exam -Patient on carvedilol 3.125 g twice daily per cardiology -D/c entresto due to low BP -Monitor renal function, avoid dehydration/hypotension -Patient is DNR, cardiology does not recommend removing LifeVest or ICD given risk for infection -Continue Corlanor -Continue aspirin and Plavix -Resume crestor 10 mg  AKI on CKD stage III.  Prior to this hospitalization previous creatinine baseline of 1.16.  Peak creatinine this admission of 6.34, has continued to downtrend, now 1.34.  Suspect was prerenal etiology in the setting of NSAIDs and contrast from arteriogram this hospital stay -Avoid nephrotoxins -Monitor BMP  Chronic hypotension.  SBP stable ranging 90s-low 100s.  Seems to be tolerating increase of Coreg started by cardiology a few days ago. -Continue to closely monitor given Entresto being added today -Add blood pressure parameters to hold  Mild anion gap metabolic acidosis likely in the setting of CKD significantly improved currently 22 up from nadir of 11 -Continue sodium bicarb  Type 2 diabetes, A1c 7.1 -Sliding scale insulin as needed, monitor CBGs    Family Communication  : None at bedside  Code Status : DNR  Disposition Plan  :  Patient is from home. Anticipated d/c date: 2 to 3 days likely to SNF versus CIR. Barriers to d/c or necessity for inpatient status:  Close monitoring of blood pressure, kidney function, able to find a stable pain regimen.requiring IV control Consults  : Nephrology, vascular surgery, cardiology  Procedures  :    DVT Prophylaxis  :  Heparin MDM: The below labs and imaging reports were reviewed and summarized above.  Medication management as above.  Lab Results  Component Value Date   PLT 197 11/08/2020    Diet :  Diet Order             Diet Heart Room service appropriate? Yes; Fluid consistency: Thin  Diet effective now                  Inpatient Medications Scheduled Meds: . acetaminophen  1,000 mg Oral Once  . aspirin EC  81 mg Oral Daily  . carvedilol  3.125 mg Oral BID WC  . clopidogrel  75 mg Oral Daily  . feeding supplement (GLUCERNA SHAKE)  237 mL Oral QID  . gabapentin  300 mg Oral BID  . Gerhardt's butt cream   Topical BID  . heparin  5,000 Units Subcutaneous Q8H  . insulin aspart  0-6 Units Subcutaneous Q4H  . ivabradine  2.5 mg Oral BID WC  . multivitamin with minerals  1 tablet Oral Daily  . nicotine  7 mg Transdermal Q24H  . oxyCODONE  10 mg Oral Q12H  . sodium bicarbonate  1,300 mg Oral BID   Continuous Infusions: . sodium chloride 10 mL/hr at 11/02/20 2000   PRN Meds:.sodium chloride, acetaminophen **OR** acetaminophen, fentaNYL (SUBLIMAZE) injection, nitroGLYCERIN, oxyCODONE  Antibiotics  :   Anti-infectives (From admission, onward)   Start     Dose/Rate Route Frequency Ordered Stop   11/06/20 0930  ceFAZolin (ANCEF) IVPB 2g/100 mL premix  Status:  Discontinued        2 g 200 mL/hr over 30 Minutes Intravenous To Surgery 11/06/20 0922 11/06/20 1340       Objective   Vitals:   11/08/20 0405 11/08/20 0823 11/08/20 0913 11/08/20 1212  BP: 118/63 100/69 (!) 90/56 (!) 97/52  Pulse: 69 69 79 75  Resp: 18 18  18   Temp: 98.6 F (37 C) 98 F (36.7 C)    TempSrc: Oral Oral    SpO2: 97% 97% 96% 95%  Weight:      Height:        SpO2: 95 % O2 Flow Rate (L/min): 2 L/min  Wt Readings from Last 3 Encounters:  11/08/20 64.8 kg  10/26/20 69.7 kg  10/22/20 69.9 kg     Intake/Output Summary (Last 24 hours) at 11/08/2020 1452 Last data filed at 11/08/2020 0913 Gross per 24 hour  Intake 120 ml  Output 1350 ml  Net -1230 ml    Physical Exam:     Somnolent, but easily arousable, alert and oriented x4, normal affect No new F.N deficits,  [place].AT, Normal respiratory effort  on room air, CTAB RRR,No Gallops,Rubs or new Murmurs, Left AKA +ve B.Sounds, Abd Soft, No tenderness, No rebound, guarding or rigidity. No Cyanosis, No new Rash or bruise     I have personally reviewed the following:   Data Reviewed:  CBC Recent Labs  Lab 11/02/20 0151 11/03/20 0358 11/07/20 0349 11/07/20 1430 11/08/20 0213  WBC 16.3* 15.7* 19.7*  --  14.1*  HGB 10.4* 10.5* 7.4* 7.7* 7.4*  HCT 31.1* 31.9* 23.3* 24.2* 22.9*  PLT 242 248 183  --  197  MCV 83.4 86.2 88.3  --  88.4  MCH 27.9 28.4 28.0  --  28.6  MCHC 33.4 32.9 31.8  --  32.3  RDW 16.4* 16.9* 18.0*  --  18.1*  LYMPHSABS 0.8 0.8  --   --  1.3  MONOABS 1.0  0.8  --   --  1.0  EOSABS 0.3 0.1  --   --  0.3  BASOSABS 0.0 0.0  --   --  0.0    Chemistries  Recent Labs  Lab 11/04/20 1440 11/05/20 0555 11/06/20 0413 11/07/20 0349 11/08/20 0213  NA 134* 136 137 139 140  K 4.8 4.5 4.2 3.7 4.2  CL 106 108 105 109 108  CO2 15* 20* 20* 22 26  GLUCOSE 102* 151* 105* 161* 114*  BUN 96* 91* 80* 58* 49*  CREATININE 2.25* 2.11* 1.81* 1.34* 1.29*  CALCIUM 9.2 9.0 9.1 9.0 8.7*   ------------------------------------------------------------------------------------------------------------------ No results for input(s): CHOL, HDL, LDLCALC, TRIG, CHOLHDL, LDLDIRECT in the last 72 hours.  Lab Results  Component Value Date   HGBA1C 7.1 (H) 10/31/2020   ------------------------------------------------------------------------------------------------------------------ No results for input(s): TSH, T4TOTAL, T3FREE, THYROIDAB in the last 72 hours.  Invalid input(s): FREET3 ------------------------------------------------------------------------------------------------------------------ No results for input(s): VITAMINB12, FOLATE, FERRITIN, TIBC, IRON, RETICCTPCT in the last 72 hours.  Coagulation profile No results for input(s): INR, PROTIME in the last 168 hours.  No results for input(s): DDIMER in the last 72  hours.  Cardiac Enzymes No results for input(s): CKMB, TROPONINI, MYOGLOBIN in the last 168 hours.  Invalid input(s): CK ------------------------------------------------------------------------------------------------------------------    Component Value Date/Time   BNP 488.4 (H) 10/30/2020 1820    Micro Results Recent Results (from the past 240 hour(s))  Respiratory Panel by RT PCR (Flu A&B, Covid) - Nasopharyngeal Swab     Status: None   Collection Time: 10/30/20  7:51 PM   Specimen: Nasopharyngeal Swab  Result Value Ref Range Status   SARS Coronavirus 2 by RT PCR NEGATIVE NEGATIVE Final    Comment: (NOTE) SARS-CoV-2 target nucleic acids are NOT DETECTED.  The SARS-CoV-2 RNA is generally detectable in upper respiratoy specimens during the acute phase of infection. The lowest concentration of SARS-CoV-2 viral copies this assay can detect is 131 copies/mL. A negative result does not preclude SARS-Cov-2 infection and should not be used as the sole basis for treatment or other patient management decisions. A negative result may occur with  improper specimen collection/handling, submission of specimen other than nasopharyngeal swab, presence of viral mutation(s) within the areas targeted by this assay, and inadequate number of viral copies (<131 copies/mL). A negative result must be combined with clinical observations, patient history, and epidemiological information. The expected result is Negative.  Fact Sheet for Patients:  PinkCheek.be  Fact Sheet for Healthcare Providers:  GravelBags.it  This test is no t yet approved or cleared by the [place] FDA and  has been authorized for detection and/or diagnosis of SARS-CoV-2 by FDA under an Emergency Use Authorization (EUA). This EUA will remain  in effect (meaning this test can be used) for the duration of the COVID-19 declaration under Section 564(b)(1) of the Act,  21 U.S.C. section 360bbb-3(b)(1), unless the authorization is terminated or revoked sooner.     Influenza A by PCR NEGATIVE NEGATIVE Final   Influenza B by PCR NEGATIVE NEGATIVE Final    Comment: (NOTE) The Xpert Xpress SARS-CoV-2/FLU/RSV assay is intended as an aid in  the diagnosis of influenza from Nasopharyngeal swab specimens and  should not be used as a sole basis for treatment. Nasal washings and  aspirates are unacceptable for Xpert Xpress SARS-CoV-2/FLU/RSV  testing.  Fact Sheet for Patients: PinkCheek.be  Fact Sheet for Healthcare Providers: GravelBags.it  This test is not yet approved or cleared by the [place] and  has been authorized for detection and/or diagnosis of SARS-CoV-2 by  FDA under an Emergency Use Authorization (EUA). This EUA will remain  in effect (meaning this test can be used) for the duration of the  Covid-19 declaration under Section 564(b)(1) of the Act, 21  U.S.C. section 360bbb-3(b)(1), unless the authorization is  terminated or revoked. Performed at [facility], [place] [address] [place], [place] [address]   Culture, blood (x 2)     Status: None   Collection Time: 10/30/20  9:41 PM   Specimen: BLOOD  Result Value Ref Range Status   Specimen Description BLOOD RIGHT ANTECUBITAL  Final   Special Requests   Final    BOTTLES DRAWN AEROBIC AND ANAEROBIC Blood Culture adequate volume   Culture   Final    NO GROWTH 5 DAYS Performed at [facility], [place] [address] [place], [place] [address]    Report Status 11/04/2020 FINAL  Final  Culture, blood (x 2)     Status: None   Collection Time: 10/31/20  1:20 AM   Specimen: BLOOD  Result Value Ref Range Status   Specimen Description BLOOD RIGHT ANTECUBITAL  Final   Special Requests   Final    BOTTLES DRAWN AEROBIC AND ANAEROBIC Blood Culture adequate volume   Culture   Final    NO GROWTH 5 DAYS Performed at [facility], [place] [address], [place], [place] [address]    Report Status 11/05/2020 FINAL  Final  Surgical PCR screen     Status: None   Collection Time: 11/06/20  2:40 AM   Specimen: Nasal Mucosa; Nasal Swab  Result Value Ref Range Status   MRSA, PCR NEGATIVE NEGATIVE Final   Staphylococcus aureus NEGATIVE NEGATIVE Final    Comment: (NOTE) The Xpert SA Assay (FDA approved for NASAL specimens in patients 29 years of age and older), is one component of a comprehensive surveillance program. It is not intended to diagnose infection nor to guide or monitor treatment. Performed at [facility], [place] [address] [place], [place] [address]     Radiology Reports US RENAL  Result Date: 10/17/2020 CLINICAL DATA:  Acute kidney injury EXAM: RENAL / URINARY TRACT ULTRASOUND COMPLETE COMPARISON:  None. FINDINGS: Right Kidney: Renal measurements: 11.8 x 5.6 x 5.7 cm = volume: 197.9 mL. Echogenicity and renal cortical thickness are within normal limits. No mass, perinephric fluid, or hydronephrosis visualized. No sonographically demonstrable calculus or ureterectasis. Left Kidney: Renal measurements: 10.7 x 4.7 x 4.1 cm = volume: 109.5 mL. Echogenicity and renal cortical thickness are within normal limits. No perinephric fluid or hydronephrosis visualized. There is a cyst arising from the lower pole left kidney measuring 1.8 x 1.4 x 1.5 cm. No sonographically demonstrable calculus or ureterectasis. Bladder: Appears normal for degree of bladder distention. Other: None. IMPRESSION: Cyst arising from lower pole left kidney measuring 1.8 x 1.4 x 1.5 cm. Study otherwise unremarkable. Electronically Signed   By: Lowella Grip III M.D.   On: 10/17/2020 15:28   PERIPHERAL VASCULAR CATHETERIZATION  Result Date: 10/16/2020 Successful intravascular lithotripsy, PTCA and stenting     7.0X39 mm balloon expandable Viabahn VBX stent Rt common iliac artery     7.0X60 mm self expanding Absolute Pro stent Rt distal  iliac artery No named vessels at ankle level in both lower extremities. Will consult vascular surgery for Rt fem-to-left profunda bypass given left foot critical limb ischemia. Nigel Mormon, MD Pager: [phone] Office: [phone]  DG Chest Portable 1 View  Result Date: 10/30/2020 CLINICAL DATA:  Chest pain EXAM: PORTABLE CHEST 1 VIEW COMPARISON:  08/03/2020 FINDINGS: Single frontal view of the chest demonstrates stenting artifact from external cardiac device. Cardiac silhouette is unremarkable. No airspace disease, effusion, or pneumothorax. No acute bony abnormalities. IMPRESSION: 1. No acute intrathoracic process. Electronically Signed   By: Randa Ngo M.D.   On: 10/30/2020 17:01   VAS US CAROTID  Result Date: 10/17/2020 Carotid Arterial Duplex Study Indications:  Pre-surgical evaluation. Risk Factors: Hypertension, hyperlipidemia, current smoker, coronary artery               disease, PAD. Performing Technologist: Maudry Mayhew MHA, RDMS, RVT, RDCS  Examination Guidelines: A complete evaluation includes B-mode imaging, spectral Doppler, color Doppler, and power Doppler as needed of all accessible portions of each vessel. Bilateral testing is considered an integral part of a complete examination. Limited examinations for reoccurring indications may be performed as noted.  Right Carotid Findings: +----------+--------+--------+--------+-----------------------+--------+           PSV cm/sEDV cm/sStenosisPlaque Description     Comments +----------+--------+--------+--------+-----------------------+--------+ CCA Prox  186                     smooth and heterogenous         +----------+--------+--------+--------+-----------------------+--------+ CCA Distal87      16              smooth and heterogenous         +----------+--------+--------+--------+-----------------------+--------+ ICA Prox  56      13              smooth and heterogenous          +----------+--------+--------+--------+-----------------------+--------+ ICA Distal70      15                                              +----------+--------+--------+--------+-----------------------+--------+ ECA       139     7               smooth and heterogenous         +----------+--------+--------+--------+-----------------------+--------+ +----------+--------+-------+----------------+-------------------+           PSV cm/sEDV cmsDescribe        Arm Pressure (mmHG) +----------+--------+-------+----------------+-------------------+ [id number]            Multiphasic, WNL                    +----------+--------+-------+----------------+-------------------+ +---------+--------+--+--------+--+---------+ VertebralPSV cm/s78EDV cm/s29Antegrade +---------+--------+--+--------+--+---------+  Left Carotid Findings: +----------+--------+--------+--------+-----------------------+--------+           PSV cm/sEDV cm/sStenosisPlaque Description     Comments +----------+--------+--------+--------+-----------------------+--------+ CCA Prox  207     10                                              +----------+--------+--------+--------+-----------------------+--------+ CCA [id number]     14              smooth and heterogenous         +----------+--------+--------+--------+-----------------------+--------+ ICA Prox  81      17              smooth and heterogenous         +----------+--------+--------+--------+-----------------------+--------+ ICA  Distal109     29                                              +----------+--------+--------+--------+-----------------------+--------+ ECA       240     14              smooth and heterogenous         +----------+--------+--------+--------+-----------------------+--------+ +----------+--------+--------+--------+-------------------+           PSV cm/sEDV cm/sDescribeArm Pressure (mmHG)  +----------+--------+--------+--------+-------------------+ Subclavian310                                         +----------+--------+--------+--------+-------------------+ +---------+--------+---+--------+--+---------------------------------+ VertebralPSV cm/s293EDV cm/s47>50% cervical stenosis, antegrade +---------+--------+---+--------+--+---------------------------------+   Summary: Right Carotid: Velocities in the right ICA are consistent with a 1-39% stenosis. Left Carotid: Velocities in the left ICA are consistent with a 1-39% stenosis. Vertebrals:  Bilateral vertebral arteries demonstrate antegrade flow. Left >50%              cervical stenosis. Subclavians: Normal flow hemodynamics were seen in bilateral subclavian              arteries. *See table(s) above for measurements and observations.  Electronically signed by Harold Barban MD on 10/17/2020 at 7:38:49 PM.    Final      Time Spent in minutes  30     Desiree Hane M.D on 11/08/2020 at 2:52 PM  To page go to [email or web address] - password [email or web address]

## 2020-11-08 NOTE — Plan of Care (Signed)

## 2020-11-08 NOTE — Progress Notes (Signed)
Orthopedic Tech Progress Note Patient Details:  Mike [name] May 03, 1961 [id number] Called order for retention sock into [facility] Patient ID: [name], male   DOB: Jun 08, 1961, 59 y.o.   MRN: [id number]   [name] 11/08/2020, 10:20 AM

## 2020-11-08 NOTE — Progress Notes (Signed)
Physical Therapy Treatment Patient Details Name: Mike [name] MRN: [id number] DOB: September 06, 1961 Today's Date: 11/08/2020    History of Present Illness 59 year old man admitted on 10/30/20 with SOB and inability to walk with severe L LE pain. Pt with critical L LE ischemia. Underwent L AKA 11/06/20. PMH: STEMI 07/2020 with moderate MR due to torsade post PCI, L LE ischemia with R iliac revascularization, CKD IV, CHF, DM.     PT Comments    Patient received in recliner. Groggy initially.  Reports he would like to get back in bed and would like to try to stand to use urinal. Sister present. He requires mod +2 for sit to stand with RW. Cues for safe hand placement and assist for balance. He is able to pivot from recliner to bed going to his right side. Attempted to hop, but was not very successful. Had lob when attempting to hop backward to sit on bed requiring mod +2 to get to bed safely. He will continue to benefit from skilled PT while here to improve functional mobility, independence and safety.     Follow Up Recommendations  SNF     Equipment Recommendations  None recommended by PT;Other (comment) (TBD)    Recommendations for Other Services       Precautions / Restrictions Precautions Precautions: Fall Precaution Comments: sacral wound, mod fall Restrictions Weight Bearing Restrictions: Yes LLE Weight Bearing: Non weight bearing    Mobility  Bed Mobility Overal bed mobility: Needs Assistance Bed Mobility: Sit to Supine     Supine to sit: Mod assist Sit to supine: Min guard   General bed mobility comments: increased time and effort due to pain, grogginess  Transfers Overall transfer level: Needs assistance Equipment used: Rolling walker (2 wheeled) Transfers: Sit to/from [facility] Sit to Stand: Mod assist;+2 physical assistance Stand pivot transfers: Mod assist;+2 physical assistance      Lateral/Scoot Transfers: Min assist General transfer  comment: instructed in lateral scoot toward R side   Ambulation/Gait Ambulation/Gait assistance: Mod assist;+2 physical assistance Gait Distance (Feet): 3 Feet Assistive device: Rolling walker (2 wheeled) Gait Pattern/deviations: Step-to pattern;Decreased step length - right Gait velocity: decreased   General Gait Details: patient able to pivot from recliner to bed, attempted to hop but was quite difficult. LOB when attempting to back up to bed. Mod +2 assist to get to edge of bed safetly.   Stairs             Wheelchair Mobility    Modified Rankin (Stroke Patients Only)       Balance Overall balance assessment: Needs assistance Sitting-balance support: Feet supported Sitting balance-Leahy Scale: Fair     Standing balance support: Bilateral upper extremity supported;During functional activity Standing balance-Leahy Scale: Poor Standing balance comment: requires help of walker and is mainly sliding R foot, min mod assist for mobility in standing                            Cognition Arousal/Alertness: Awake/alert Behavior During Therapy: WFL for tasks assessed/performed Overall Cognitive Status: Impaired/Different from baseline Area of Impairment: Following commands;Problem solving;Safety/judgement;Attention;Memory                   Current Attention Level: Sustained Memory: Decreased short-term memory Following Commands: Follows one step commands with increased time Safety/Judgement: Decreased awareness of safety;Decreased awareness of deficits   Problem Solving: Slow processing;Requires verbal cues General Comments: slightly lethargic likely due  to pain medicine      Exercises Amputee Exercises Quad Sets: AROM;5 reps;Left Hip ABduction/ADduction: AROM;Left;5 reps Straight Leg Raises: AROM;Left;5 reps    General Comments        Pertinent Vitals/Pain Pain Assessment: Faces Pain Score: 8  Faces Pain Scale: Hurts whole lot Pain  Location: L LE Pain Descriptors / Indicators: Grimacing;Guarding;Discomfort;Sore Pain Intervention(s): Limited activity within patient's tolerance;Monitored during session;Repositioned    Home Living                      Prior Function            PT Goals (current goals can now be found in the care plan section) Acute Rehab PT Goals Patient Stated Goal: to go to rehab then home PT Goal Formulation: With patient/family Time For Goal Achievement: 11/14/20 Potential to Achieve Goals: Good Progress towards PT goals: Progressing toward goals    Frequency    Min 2X/week      PT Plan Discharge plan needs to be updated;Frequency needs to be updated    Co-evaluation              AM-PAC PT "6 Clicks" Mobility   Outcome Measure  Help needed turning from your back to your side while in a flat bed without using bedrails?: None Help needed moving from lying on your back to sitting on the side of a flat bed without using bedrails?: A Little Help needed moving to and from a bed to a chair (including a wheelchair)?: A Little Help needed standing up from a chair using your arms (e.g., wheelchair or bedside chair)?: A Lot Help needed to walk in hospital room?: A Lot Help needed climbing 3-5 steps with a railing? : Total 6 Click Score: 15    End of Session Equipment Utilized During Treatment: Gait belt Activity Tolerance: Patient limited by fatigue;Patient limited by pain Patient left: in bed;with bed alarm set;with call bell/phone within reach Nurse Communication: Mobility status PT Visit Diagnosis: Pain;Difficulty in walking, not elsewhere classified (R26.2);Other abnormalities of gait and mobility (R26.89);Unsteadiness on feet (R26.81);Muscle weakness (generalized) (M62.81) Pain - Right/Left: Left Pain - part of body: Leg     Time: 4742-5956 PT Time Calculation (min) (ACUTE ONLY): 30 min  Charges:  $Therapeutic Exercise: 8-22 mins $Therapeutic Activity: 8-22  mins                     Esiquio Boesen, PT, GCS 11/08/20,12:33 PM

## 2020-11-08 NOTE — Progress Notes (Signed)
Occupational Therapy Treatment Patient Details Name: Mike [name] MRN: [id number] DOB: 05/01/1961 Today's Date: 11/08/2020    History of present illness 59 year old man admitted on 10/30/20 with SOB and inability to walk with severe L LE pain. Pt with critical L LE ischemia. Underwent L AKA 11/06/20. PMH: STEMI 07/2020 with moderate MR due to torsade post PCI, L LE ischemia with R iliac revascularization, CKD IV, CHF, DM.    OT comments  Assisted pt to EOB with mod assist. Performed lateral scoot with min assist. Educated pt in compensatory strategies for LB ADL leaning side to side and in pressure relief to protect skin. Sister arrived near end of session and thinks pt requires a more extended period of time for rehab, seeking SNF. Updated d/c plan.   Follow Up Recommendations  SNF;Supervision/Assistance - 24 hour (pt and sister prefer SNF)    Equipment Recommendations  3 in 1 bedside commode;Tub/shower bench;Wheelchair (measurements OT);Wheelchair cushion (measurements OT)    Recommendations for Other Services      Precautions / Restrictions Precautions Precautions: Fall Precaution Comments: sacral wound Restrictions LLE Weight Bearing: Non weight bearing       Mobility Bed Mobility Overal bed mobility: Needs Assistance Bed Mobility: Supine to Sit     Supine to sit: Mod assist     General bed mobility comments: increased time and effort, assist to raise trunk and position hips at EOB  Transfers Overall transfer level: Needs assistance   Transfers: Lateral/Scoot Transfers          Lateral/Scoot Transfers: Min assist General transfer comment: instructed in lateral scoot toward R side     Balance Overall balance assessment: Needs assistance   Sitting balance-Leahy Scale: Fair                                     ADL either performed or assessed with clinical judgement   ADL Overall ADL's : Needs assistance/impaired Eating/Feeding:  Independent   Grooming: Wash/dry hands;Wash/dry face;Brushing hair;Sitting;Set up         Lower Body Bathing Details (indicate cue type and reason): educated pt in bathing in sitting leaning side to side       Lower Body Dressing Details (indicate cue type and reason): educated pt in LB dressing in sitting leaning side to side                     Vision       Perception     Praxis      Cognition Arousal/Alertness: Awake/alert Behavior During Therapy: WFL for tasks assessed/performed Overall Cognitive Status: Impaired/Different from baseline Area of Impairment: Following commands;Problem solving;Safety/judgement;Attention;Memory                   Current Attention Level: Sustained Memory: Decreased short-term memory Following Commands: Follows one step commands with increased time Safety/Judgement: Decreased awareness of safety;Decreased awareness of deficits   Problem Solving: Slow processing;Requires verbal cues          Exercises     Shoulder Instructions       General Comments      Pertinent Vitals/ Pain       Pain Assessment: 0-10 Pain Score: 8  Pain Location: L LE Pain Descriptors / Indicators: Sharp;Jabbing;Grimacing;Guarding Pain Intervention(s): Monitored during session;Repositioned;Patient requesting pain meds-RN notified  Home Living  Prior Functioning/Environment              Frequency  Min 2X/week        Progress Toward Goals  OT Goals(current goals can now be found in the care plan section)  Progress towards OT goals: Progressing toward goals  Acute Rehab OT Goals Patient Stated Goal: return home independently OT Goal Formulation: With patient Time For Goal Achievement: 11/21/20 Potential to Achieve Goals: Good  Plan Discharge plan needs to be updated    Co-evaluation                 AM-PAC OT "6 Clicks" Daily Activity     Outcome  Measure   Help from another person eating meals?: None Help from another person taking care of personal grooming?: A Little Help from another person toileting, which includes using toliet, bedpan, or urinal?: A Lot Help from another person bathing (including washing, rinsing, drying)?: A Lot Help from another person to put on and taking off regular upper body clothing?: A Little Help from another person to put on and taking off regular lower body clothing?: A Lot 6 Click Score: 16    End of Session Equipment Utilized During Treatment: Gait belt  OT Visit Diagnosis: Unsteadiness on feet (R26.81);Other abnormalities of gait and mobility (R26.89);Pain   Activity Tolerance Patient tolerated treatment well   Patient Left in chair;with call bell/phone within reach;with chair alarm set;with family/visitor present   Nurse Communication Patient requests pain meds        Time: 7169-6789 OT Time Calculation (min): 28 min  Charges: OT General Charges $OT Visit: 1 Visit OT Treatments $Self Care/Home Management : 8-22 mins $Therapeutic Activity: 8-22 mins  Nestor Lewandowsky, OTR/L Acute Rehabilitation Services Pager: [phone] Office: [phone]   Malka So 11/08/2020, 10:04 AM

## 2020-11-08 NOTE — Progress Notes (Signed)
Subjective:  S/p L AKA Has phantom pain Overall, feels better No other complaints  Objective:  Vital Signs in the last 24 hours: Temp:  [98 F (36.7 C)-99.1 F (37.3 C)] 98 F (36.7 C) (11/11 0823) Pulse Rate:  [68-79] 79 (11/11 0913) Resp:  [16-20] 18 (11/11 0823) BP: (86-118)/(51-70) 90/56 (11/11 0913) SpO2:  [94 %-97 %] 96 % (11/11 0913) Weight:  [64.8 kg] 64.8 kg (11/11 0044)  Intake/Output from previous day: 11/10 0701 - 11/11 0700 In: 240 [P.O.:240] Out: 1350 [Urine:1350]  Physical Exam Vitals and nursing note reviewed.  Constitutional:      General: He is not in acute distress. Neck:     Vascular: No JVD.  Cardiovascular:     Rate and Rhythm: Normal rate and regular rhythm.     Pulses:          Femoral pulses are 2+ on the right side and 0 on the left side.      Popliteal pulses are 1+ on the right side.       Dorsalis pedis pulses are 0 on the right side.       Posterior tibial pulses are 0 on the right side.     Heart sounds: Normal heart sounds. No murmur heard.   Pulmonary:     Effort: Pulmonary effort is normal.     Breath sounds: Normal breath sounds. No wheezing or rales.  Musculoskeletal:     Comments: Lt AKA       Lab Results: BMP Recent Labs    09/25/20 0903 10/17/20 0825 10/22/20 1054 10/22/20 1054 10/26/20 1004 10/30/20 1652 11/06/20 0413 11/07/20 0349 11/08/20 0213  NA 140   < > CANCELED   < > 135   < > 137 139 140  K 4.5   < > CANCELED   < > 3.9   < > 4.2 3.7 4.2  CL 108*   < > CANCELED   < > 98   < > 105 109 108  CO2 18*   < > CANCELED   < > 11*   < > 20* 22 26  GLUCOSE 102*   < > 135*   < > 210*   < > 105* 161* 114*  BUN 15   < > 67*   < > 79*   < > 80* 58* 49*  CREATININE 1.16   < > 6.34*   < > 5.36*   < > 1.81* 1.34* 1.29*  CALCIUM 9.4   < > CANCELED   < > 9.1   < > 9.1 9.0 8.7*  GFRNONAA 68   < > 9*   < > 11*   < > 42* >60 >60  GFRAA 78  --  10*  --  12*  --   --   --   --    < > = values in this interval not displayed.     CBC Recent Labs  Lab 11/08/20 0213  WBC 14.1*  RBC 2.59*  HGB 7.4*  HCT 22.9*  PLT 197  MCV 88.4  MCH 28.6  MCHC 32.3  RDW 18.1*  LYMPHSABS 1.3  MONOABS 1.0  EOSABS 0.3  BASOSABS 0.0    HEMOGLOBIN A1C Lab Results  Component Value Date   HGBA1C 7.1 (H) 10/31/2020   MPG 157.07 10/31/2020    Cardiac Panel (last 3 results) Recent Labs    10/17/20 1413  CKTOTAL 486*    BNP (last 3 results) Recent Labs    08/03/20  1062 10/22/20 1054 10/30/20 1820  BNP 751.4* 261.5* 488.4*    TSH Recent Labs    10/31/20 0429  TSH 10.137*    Lipid Panel     Component Value Date/Time   CHOL 95 (L) 09/11/2020 0806   TRIG 76 09/11/2020 0806   HDL 30 (L) 09/11/2020 0806   CHOLHDL 3.2 09/11/2020 0806   CHOLHDL 4.5 08/02/2020 0707   VLDL 15 08/02/2020 0707   LDLCALC 49 09/11/2020 0806     Hepatic Function Panel Recent Labs    08/02/20 0026 08/02/20 0026 09/25/20 0903 10/31/20 0429 11/04/20 1440 11/06/20 0413 11/07/20 0349 11/08/20 0213  PROT 7.5  --  7.0 7.2  --   --   --   --   ALBUMIN 4.2   < > 4.3 2.5*   < > 2.5* 2.2* 2.2*  AST 29  --  22 235*  --   --   --   --   ALT 22  --  25 123*  --   --   --   --   ALKPHOS 85  --  82 81  --   --   --   --   BILITOT 1.0  --  0.8 0.9  --   --   --   --   BILIDIR  --   --   --  0.2  --   --   --   --   IBILI  --   --   --  0.7  --   --   --   --    < > = values in this interval not displayed.   CARDIAC STUDIES:  EKG 10/30/2020: Sinus rhythm 73 bpm Nonspecific ST elevation inferior leads. Not STEMI  PV intervention 10/16/2020: Successful intravascular lithotripsy, PTCA and stenting 7.0X39 mm balloon expandable Viabahn VBX stent Rt common iliac artery  7.0X60 mm self expanding Absolute Pro stent Rt distal iliac artery  No named vessels at ankle level in both lower extremities.  Will consult vascular surgery for Rt fem-to-left profunda bypass given left foot critical limb ischemia.  Abdominal Aortic  Duplex 09/21/2020:  Moderate plaque noted in the proximal, mid and distal aorta. There is an  ulcerated plaque noted in the distal abdominal aorta. No AAA. Normal iliac  artery velocity.   Abdominal Aortic Duplex 09/21/2020:  Moderate plaque noted in the proximal, mid and distal aorta. There is an  ulcerated plaque noted in the distal abdominal aorta. No AAA. Normal iliac  artery velocity.  Lower Extremity Arterial Duplex 09/21/2020:  Theright SFA is occluded in the proximal segment with reconstitution at  the level of the popliteal artery with diffuse monophasic waveform below  the knee. Right profunda femoral artery has >50% stenosis. There is  moderate mixed plaque noted throughout the right lower extremity.  Monophasic waveform throughout the left lower extremity, indicates  significant proximal disease (iliac artery).  Left SFA is occluded in the proximal segment and reconstitutes just above popliteal artery and diffuse dampened monophasic waveform throughout the lower extremity below the  knee. There is moderate mixed plaque throughout the left lower extremity.   This exam reveals severely decreased perfusion of the right lower  extremity, noted at the dorsalis pedis artery level (ABI 0.42) and  critically decreased perfusion of the left lower extremity, noted at the  dorsalis pedis and post tibial artery level (ABI 0.03).   Echocardiogram 09/11/2020:  Left ventricle cavity is moderately dilated. Moderate concentric  hypertrophy of  the left ventricle. Moderate global and severe  inferolateral hypokinesis. LVEF 15-20%. Grade 1 diastolic dysfunction.  Normal left atrial pressure. Calculated EF 15%.  Mild to moderate mitral regurgitation.  Inadequate TR jet to estimate pulmonary artery systolic pressure. Normal  right atrial pressure.  Compared to previous study in 03/2020, LVEF is reduced from 30-35%. Mitral  regurgitation is marginally improved.   EKG  08/14/2020: Sinus rhythm 81 bpm Old inferior and anterior infarct  Coronary intervention 08/03/2020: LM: Distal 40% stenosis LAD:Mid 30% disease LCx: Subtotally occluded OM2, prox LCx 70% stenosis Successful percutaneous coronary intervention OM2-Prox LCx PTCA and overlapping stents placement  2.5 X 38 mm and 2.5 X 18 mm Resolute Onyx drug-eluting stents 100%--->0% stenosis. TIMI flow 0-->III Small caliber distal vessel with moderate diffuse disease RCA: Prox 30% stenosis. Patent mid RCA sttent 2.5 X 26 mm Resolute Onyx drug-eluting stent  LVEDP 42 mmHg  Echocardiogram 08/02/2020: 1. Moderate global and severe inferolateral hypokinesis. Left ventricular  ejection fraction, by estimation, is 30 to 35%. The left ventricle has  normal function. The left ventricle demonstrates regional wall motion  abnormalities (see scoring  diagram/findings for description). There is mild left ventricular  hypertrophy. Left ventricular diastolic parameters are consistent with  Grade II diastolic dysfunction (pseudonormalization).  2. Right ventricular systolic function is low normal. The right  ventricular size is normal.  3. Left atrial size was mildly dilated.  4. The mitral valve is grossly normal. Moderate mitral valve  regurgitation.  5. The aortic valve is tricuspid. Aortic valve regurgitation is not  visualized. Mild aortic valve sclerosis is present, with no evidence of  aortic valve stenosis.  6. The inferior vena cava is dilated in size with >50% respiratory  variability, suggesting right atrial pressure of 8 mmHg.   Coronary intervention 08/01/2020: LM: Distal 30% stenosis LAD:Mid 30% disease LCx: Subtotally occluded OM2, bridging left-to-left and right-to-left collaterals RCA: Prox 30% stenosis. Mid 100% occlusion Successful percutaneous coronary intervention mid RCA PTCA and stent placement 2.5 X 26 mm Resolute  Onyx drug-eluting stent 100%--->0% stenosis. TIMI flow 0-->III  Assessment & Recommendations:  Mike [name] a59 y.o.Caucasian male with CAD, culprit (RCA) and nonculprit (LCx) PCI after STEMI 07/2020, HFrEF, moderate MR, h/o cardiac arrest 2/2 Torsades post PCI during index hospitalization, PAD with critical limb ischemia s/p Rt iliac revascularization, residual critical limb ischemia LLE with no immediate targets for revascularization, severe bilateral renal artery stenoses, acute kidney injury following peripheral arteriogram on10/19/2021.   HFrEF: Euvolumic. Had hypotension with Entresto, thus stopped. Given his recent AKI, I would avoid hyptension. On carvedilol 3.125 mg bid. Sinus pauses up to 5.9 sec during sleep without symptoms, likely due to OSA. Okay to continue carvedilol, but would avoid further dose up titration. Continue corlanor 5 mg bid.  Troponin elevation: Likely supply demand mismatch in the setting of acute systolic heart failure, acute kidney injury. Do not suspect acute coronary syndrome.   Acute kidney injury: Resolved  CAD: S/p STEMI 07/2020 Conitnue Aspirin/plavix Okay to resume Crestor at 10 mg, will up titrate outpatient.  Continue carvedilol 3/125 mg bid  PAD: Successful revascularization of right common iliac and right external iliac artery. Now s/p Lt AKA, healing well Will check Rt ABI, s/p iliac artery stenting  Goals of care : Patient reamains DNR< but is having second thoughts, now that his resting pain is improving. We mutually decided to not extent his LifeVest duration.  No plans for ICD at this time, given recent surgery and risk for  pocket infection   Nigel Mormon, MD Pager: [phone] Office: [phone]

## 2020-11-09 ENCOUNTER — Encounter (HOSPITAL_COMMUNITY): Payer: BC Managed Care – PPO

## 2020-11-09 DIAGNOSIS — R338 Other retention of urine: Secondary | ICD-10-CM | POA: Diagnosis not present

## 2020-11-09 DIAGNOSIS — R339 Retention of urine, unspecified: Secondary | ICD-10-CM

## 2020-11-09 DIAGNOSIS — I255 Ischemic cardiomyopathy: Secondary | ICD-10-CM

## 2020-11-09 DIAGNOSIS — I5042 Chronic combined systolic (congestive) and diastolic (congestive) heart failure: Secondary | ICD-10-CM | POA: Diagnosis not present

## 2020-11-09 DIAGNOSIS — I5023 Acute on chronic systolic (congestive) heart failure: Secondary | ICD-10-CM | POA: Diagnosis not present

## 2020-11-09 DIAGNOSIS — I509 Heart failure, unspecified: Secondary | ICD-10-CM | POA: Diagnosis not present

## 2020-11-09 LAB — URINALYSIS, ROUTINE W REFLEX MICROSCOPIC
Bilirubin Urine: NEGATIVE
Glucose, UA: NEGATIVE mg/dL
Hgb urine dipstick: NEGATIVE
Ketones, ur: NEGATIVE mg/dL
Leukocytes,Ua: NEGATIVE
Nitrite: NEGATIVE
Protein, ur: NEGATIVE mg/dL
Specific Gravity, Urine: 1.015 (ref 1.005–1.030)
pH: 6 (ref 5.0–8.0)

## 2020-11-09 LAB — CBC WITH DIFFERENTIAL/PLATELET
Abs Immature Granulocytes: 0.14 10*3/uL — ABNORMAL HIGH (ref 0.00–0.07)
Basophils Absolute: 0 10*3/uL (ref 0.0–0.1)
Basophils Relative: 0 %
Eosinophils Absolute: 0.4 10*3/uL (ref 0.0–0.5)
Eosinophils Relative: 3 %
HCT: 22.4 % — ABNORMAL LOW (ref 39.0–52.0)
Hemoglobin: 7.2 g/dL — ABNORMAL LOW (ref 13.0–17.0)
Immature Granulocytes: 1 %
Lymphocytes Relative: 12 %
Lymphs Abs: 1.5 10*3/uL (ref 0.7–4.0)
MCH: 28.9 pg (ref 26.0–34.0)
MCHC: 32.1 g/dL (ref 30.0–36.0)
MCV: 90 fL (ref 80.0–100.0)
Monocytes Absolute: 1 10*3/uL (ref 0.1–1.0)
Monocytes Relative: 8 %
Neutro Abs: 9.8 10*3/uL — ABNORMAL HIGH (ref 1.7–7.7)
Neutrophils Relative %: 76 %
Platelets: 205 10*3/uL (ref 150–400)
RBC: 2.49 MIL/uL — ABNORMAL LOW (ref 4.22–5.81)
RDW: 18.6 % — ABNORMAL HIGH (ref 11.5–15.5)
WBC: 12.9 10*3/uL — ABNORMAL HIGH (ref 4.0–10.5)
nRBC: 0 % (ref 0.0–0.2)

## 2020-11-09 LAB — BASIC METABOLIC PANEL
Anion gap: 7 (ref 5–15)
BUN: 37 mg/dL — ABNORMAL HIGH (ref 8–23)
CO2: 25 mmol/L (ref 22–32)
Calcium: 8.2 mg/dL — ABNORMAL LOW (ref 8.9–10.3)
Chloride: 105 mmol/L (ref 98–111)
Creatinine, Ser: 1.12 mg/dL (ref 0.61–1.24)
GFR, Estimated: >60 mL/min (ref >60)
Glucose, Bld: 98 mg/dL (ref 70–99)
Potassium: 3.7 mmol/L (ref 3.5–5.1)
Sodium: 137 mmol/L (ref 135–145)

## 2020-11-09 LAB — GLUCOSE, CAPILLARY
Glucose-Capillary: 100 mg/dL — ABNORMAL HIGH (ref 70–99)
Glucose-Capillary: 142 mg/dL — ABNORMAL HIGH (ref 70–99)
Glucose-Capillary: 144 mg/dL — ABNORMAL HIGH (ref 70–99)
Glucose-Capillary: 152 mg/dL — ABNORMAL HIGH (ref 70–99)
Glucose-Capillary: 157 mg/dL — ABNORMAL HIGH (ref 70–99)
Glucose-Capillary: 95 mg/dL (ref 70–99)

## 2020-11-09 MED ORDER — POLYETHYLENE GLYCOL 3350 17 G PO PACK
17.0000 g | PACK | Freq: Two times a day (BID) | ORAL | Status: DC
  Administered 2020-11-09 – 2020-11-12 (×7): 17 g via ORAL
  Filled 2020-11-09 (×7): qty 1

## 2020-11-09 MED ORDER — OXYCODONE HCL ER 10 MG PO T12A: 10.0000 mg | EXTENDED_RELEASE_TABLET | Freq: Every day | ORAL | Status: DC

## 2020-11-09 MED ORDER — OXYCODONE HCL 5 MG PO TABS
5.0000 mg | ORAL_TABLET | ORAL | Status: DC | PRN
  Administered 2020-11-10 (×4): 5 mg via ORAL
  Filled 2020-11-09 (×4): qty 1

## 2020-11-09 MED ORDER — SENNOSIDES-DOCUSATE SODIUM 8.6-50 MG PO TABS
2.0000 | ORAL_TABLET | Freq: Two times a day (BID) | ORAL | Status: DC
  Administered 2020-11-09 – 2020-11-14 (×11): 2 via ORAL
  Filled 2020-11-09 (×12): qty 2

## 2020-11-09 NOTE — NC FL2 (Signed)
[facility] LEVEL OF CARE SCREENING TOOL     IDENTIFICATION  Patient Name: Mike [name] Birthdate: 07-20-1961 Sex: male Admission Date (Current Location): 10/30/2020  [facility] and [place] Number:  Herbalist and Address:  [facility], [place] [address] [place], [place] [address]      Provider Number: [id number]  Attending Physician Name and Address:  Desiree Hane, MD  Relative Name and Phone Number:  Sister [phone]    Current Level of Care: Hospital Recommended Level of Care: [place] Prior Approval Number:    Date Approved/Denied:   PASRR Number: [id number]  Discharge Plan: SNF    Current Diagnoses: Patient Active Problem List   Diagnosis Date Noted  . Normal anion gap metabolic acidosis 40/07/6760  . Hypotension 11/07/2020  . Chest pain 10/31/2020  . Acute on chronic HFrEF (heart failure with reduced ejection fraction) ([place])   . Goals of care, counseling/discussion   . Palliative care by specialist   . DNR (do not resuscitate)   . Critical limb ischemia with history of revascularization of same extremity ([place]) 10/30/2020  . Hyperglycemia 10/30/2020  . Tobacco abuse 10/30/2020  . Leukocytosis 10/30/2020  . Chronic combined systolic and diastolic CHF (congestive heart failure) ([place]) 10/30/2020  . Ischemic cardiomyopathy 10/24/2020  . AKI (acute kidney injury) ([place]) 10/17/2020  . PAD (peripheral artery disease) ([place]) 10/16/2020  . Critical lower limb ischemia ([place]) 10/05/2020  . Rest pain of left upper extremity due to atherosclerosis ([place]) 10/05/2020  . Severe claudication ([place]) 09/20/2020  . Paresthesia of both lower extremities 09/20/2020  . H/O cardiac arrest 09/20/2020  . H/O acute myocardial infarction 08/14/2020  . Snoring 08/14/2020  . Sinus pause 08/14/2020  . Mixed hyperlipidemia 08/14/2020  . Tobacco dependence 08/14/2020  . Coronary artery disease involving native coronary  artery of native heart without angina pectoris 08/10/2020  . HFrEF (heart failure with reduced ejection fraction) ([place]) 08/10/2020  . Moderate mitral regurgitation 08/10/2020  . STEMI (ST elevation myocardial infarction) ([place]) 08/02/2020  . Accelerated hypertension 08/01/2020    Orientation RESPIRATION BLADDER Height & Weight     Self, Time, Situation, Place  Normal Continent Weight: 142 lb 6.7 oz (64.6 kg) Height:  6' (182.9 cm)  BEHAVIORAL SYMPTOMS/MOOD NEUROLOGICAL BOWEL NUTRITION STATUS      Continent Diet (See Discharge Summary)  AMBULATORY STATUS COMMUNICATION OF NEEDS Skin   Limited Assist Verbally Other (Comment), Surgical wounds (Pressure Injury 11/09/20 Sacrum Right Medial Stage 2)                       Personal Care Assistance Level of Assistance  Bathing, Feeding, Dressing Bathing Assistance: Limited assistance Feeding assistance: Independent (able to feed self;Cardiac) Dressing Assistance: Limited assistance     Functional Limitations Info  Sight, Hearing, Speech Sight Info: Adequate Hearing Info: Adequate Speech Info: Adequate    SPECIAL CARE FACTORS FREQUENCY  PT (By licensed PT), OT (By licensed OT)     PT Frequency: 5x min weekly OT Frequency: 5x min weekly            Contractures Contractures Info: Not present    Additional Factors Info  Code Status, Insulin Sliding Scale Code Status Info: DNR     Insulin Sliding Scale Info: insulin aspart (novoLOG) injection 0-6 Units every 4 hours,       Current Medications (11/09/2020):  This is the current hospital active medication list Current Facility-Administered Medications  Medication Dose  Route Frequency Provider Last Rate Last Admin  . 0.9 %  sodium chloride infusion   Intravenous PRN Dagoberto Ligas, PA-C 10 mL/hr at 11/02/20 2000 Rate Verify at 11/02/20 2000  . acetaminophen (TYLENOL) tablet 650 mg  650 mg Oral Q6H PRN Dagoberto Ligas, PA-C       Or  . acetaminophen (TYLENOL)  suppository 650 mg  650 mg Rectal Q6H PRN Dagoberto Ligas, PA-C      . acetaminophen (TYLENOL) tablet 1,000 mg  1,000 mg Oral Once Duane Boston, MD      . aspirin EC tablet 81 mg  81 mg Oral Daily Dagoberto Ligas, PA-C   81 mg at 11/09/20 0850  . carvedilol (COREG) tablet 3.125 mg  3.125 mg Oral BID WC Dagoberto Ligas, PA-C   3.125 mg at 11/09/20 0850  . clopidogrel (PLAVIX) tablet 75 mg  75 mg Oral Daily Dagoberto Ligas, PA-C   75 mg at 11/09/20 0849  . feeding supplement (GLUCERNA SHAKE) (GLUCERNA SHAKE) liquid 237 mL  237 mL Oral QID Oretha Milch D, MD   237 mL at 11/09/20 0900  . fentaNYL (SUBLIMAZE) injection 50 mcg  50 mcg Intravenous Q2H PRN Oretha Milch D, MD      . gabapentin (NEURONTIN) capsule 300 mg  300 mg Oral BID Serafina Mitchell, MD   300 mg at 11/09/20 0849  . Gerhardt's butt cream   Topical BID Dagoberto Ligas, PA-C   1 application at 33/83/29 0851  . heparin injection 5,000 Units  5,000 Units Subcutaneous Q8H Dagoberto Ligas, PA-C   5,000 Units at 11/09/20 0600  . insulin aspart (novoLOG) injection 0-6 Units  0-6 Units Subcutaneous Q4H Dagoberto Ligas, PA-C   1 Units at 11/08/20 1156  . ivabradine (CORLANOR) tablet 2.5 mg  2.5 mg Oral BID WC Dagoberto Ligas, PA-C   2.5 mg at 11/09/20 0851  . multivitamin with minerals tablet 1 tablet  1 tablet Oral Daily Dagoberto Ligas, PA-C   1 tablet at 11/09/20 0850  . nicotine (NICODERM CQ - dosed in mg/24 hr) patch 7 mg  7 mg Transdermal Q24H Dagoberto Ligas, PA-C   7 mg at 11/04/20 1012  . nitroGLYCERIN (NITROSTAT) SL tablet 0.4 mg  0.4 mg Sublingual Q5 min PRN Dagoberto Ligas, PA-C      . oxyCODONE (Oxy IR/ROXICODONE) immediate release tablet 5 mg  5 mg Oral Q4H PRN Oretha Milch D, MD   5 mg at 11/09/20 0419  . oxyCODONE (OXYCONTIN) 12 hr tablet 10 mg  10 mg Oral Q12H Oretha Milch D, MD   10 mg at 11/09/20 0850  . rosuvastatin (CRESTOR) tablet 10 mg  10 mg Oral Daily Oretha Milch D, MD   10 mg at 11/09/20 0850  .  sodium bicarbonate tablet 1,300 mg  1,300 mg Oral BID Dagoberto Ligas, PA-C   1,300 mg at 11/09/20 1916     Discharge Medications: Please see discharge summary for a list of discharge medications.  Relevant Imaging Results:  Relevant Lab Results:   Additional Information [id number]  Loreta Ave, LCSWA

## 2020-11-09 NOTE — Progress Notes (Signed)
Attempted Foley insertion. Unable to insert all the way, met resistance.

## 2020-11-09 NOTE — Progress Notes (Signed)
[facility]  PROGRESS NOTE  Mike [name] YJE:[id number] DOB: 03-26-1961 DOA: 10/30/2020 PCP: Patient, No Pcp Per Admit date - 10/30/2020   Admitting Physician Eben Burow, MD  Outpatient Primary MD for the patient is Patient, No Pcp Per  LOS - 41 Brief Narrative   Mike [name] is a 60 year old male with medical history significant for STEMI (6/37/8588) complicated by cardiac arrest secondary to sides post PCI with chronic ST elevations in inferior leads since, severe bilateral renal artery stenosis, PAD with history of critical limb ischemia requiring right iliac revascularization of left lower extremity, chronic systolic CHF related to ischemic cardiomyopathy who presented on 11/2 with complaints of shortness of breath decrease ability to walk due to severe leg pain found to have critical limbischemia of left leg.   Patient has had ongoing issues with atherosclerotic vascular disease of the left leg but given his comorbidities (recent STEMI still requiring aspirin and Brilinta, CKD, depressed EF patient was too high risk for surgical revascularization per vascular surgery, but due to current hospitalization and further decompensation of symptoms after surgery elected to do above-knee amputation to deviate his rest pain.  Patient was transitioned from aspirin Brilinta aspirin and Plavix and underwent left AKA by Dr. Donnetta Hutching 11/9.  Subjective  Today feels foggy after pain medication. Complained of worsening phantom pain to vascular team.   A & P    Severe PAD with gangrene of left leg with no revascularization options, status post left AKA due to gangrene and intolerable pain/rest pain below/9.  Pain is much better controlled.  OFF PCA pump. Stump healing well   -Wean oxycontin to 10 mg q 12 HRs -wean IV fentanyl to 50 mcg per severe breakthrough pain as needed every 2 hours -Oral oxycodone IR, every 4 hours as needed moderate pain --vascular outpatient follow up w/Dr. Donnetta Hutching  in 4 weeks (his office will call), maintain staples for 4 weeks.   AKI on CKD stage III, improved.  Occurred in setting of contrast received after peripheral arteriogram.  Creatinine back at baseline -Avoid nephrotoxins  Chronic systolic failure in setting of ischemic cardiomyopathy (recent STEMI in August 2020) history of), euvolemic on exam -Patient on carvedilol 3.125 g twice daily per cardiology -D/c entresto due to low BP -Monitor renal function, avoid dehydration/hypotension -Patient is DNR, cardiology does not recommend removing LifeVest or ICD given risk for infection -Continue Corlanor -Continue aspirin and Plavix -- crestor 10 mg  AKI on CKD stage III.  Prior to this hospitalization previous creatinine baseline of 1.16.  Peak creatinine this admission of 6.34, has continued to downtrend, now 1.34.  Suspect was prerenal etiology in the setting of NSAIDs and contrast from arteriogram this hospital stay -Avoid nephrotoxins -Monitor BMP  Chronic hypotension.  SBP stable ranging 90s-low 100s.  Seems to be tolerating Coreg  -entresto discontinued due to low BP  Mild anion gap metabolic acidosis likely in the setting of CKD now resolved. nadir of 11 -Continue sodium bicarb  Type 2 diabetes, A1c 7.1. glucose at goal -Sliding scale insulin as needed, monitor CBGs  Acute urinary retention. Suspect related to recent surgery and decreased mobility. UA unremarkable. In and out caht x2 for urine retention x 500 cc in 24 hours. Foley placed on 11/12 -continue foley and due voiding trial likely on discharge -if persists will need urology follow up for outpatient evaluation.    Family Communication  : None at bedside  Code Status : DNR  Disposition Plan  :  Patient  is from home. Anticipated d/c date: 1 to 2 days likely to SNF versus CIR. Barriers to d/c or necessity for inpatient status:  weaning off IV PRN pain management, to find a stable pain regimen without requiring IV pain  control Consults  : Nephrology, vascular surgery, cardiology  Procedures  :    DVT Prophylaxis  : Heparin MDM: The below labs and imaging reports were reviewed and summarized above.  Medication management as above.  Lab Results  Component Value Date   PLT 205 11/09/2020    Diet :  Diet Order            Diet Heart Room service appropriate? Yes; Fluid consistency: Thin  Diet effective now                  Inpatient Medications Scheduled Meds: . acetaminophen  1,000 mg Oral Once  . aspirin EC  81 mg Oral Daily  . carvedilol  3.125 mg Oral BID WC  . clopidogrel  75 mg Oral Daily  . feeding supplement (GLUCERNA SHAKE)  237 mL Oral QID  . gabapentin  300 mg Oral BID  . Gerhardt's butt cream   Topical BID  . heparin  5,000 Units Subcutaneous Q8H  . insulin aspart  0-6 Units Subcutaneous Q4H  . ivabradine  2.5 mg Oral BID WC  . multivitamin with minerals  1 tablet Oral Daily  . nicotine  7 mg Transdermal Q24H  . oxyCODONE  10 mg Oral Q12H  . rosuvastatin  10 mg Oral Daily  . sodium bicarbonate  1,300 mg Oral BID   Continuous Infusions: . sodium chloride 10 mL/hr at 11/02/20 2000   PRN Meds:.sodium chloride, acetaminophen **OR** acetaminophen, fentaNYL (SUBLIMAZE) injection, nitroGLYCERIN, oxyCODONE  Antibiotics  :   Anti-infectives (From admission, onward)   Start     Dose/Rate Route Frequency Ordered Stop   11/06/20 0930  ceFAZolin (ANCEF) IVPB 2g/100 mL premix  Status:  Discontinued        2 g 200 mL/hr over 30 Minutes Intravenous To Surgery 11/06/20 0922 11/06/20 1340       Objective   Vitals:   11/08/20 1633 11/08/20 2052 11/09/20 0424 11/09/20 1218  BP: 105/64 (!) 99/52 101/60 (!) 101/43  Pulse: 76 71 72 72  Resp: 18 20 18 19   Temp: 98.4 F (36.9 C) 98.7 F (37.1 C) 98.4 F (36.9 C) 98.6 F (37 C)  TempSrc: Oral Oral Oral Oral  SpO2: 97% 95% 97% 94%  Weight:   64.6 kg   Height:        SpO2: 94 % O2 Flow Rate (L/min): 2 L/min  Wt Readings from  Last 3 Encounters:  11/09/20 64.6 kg  10/26/20 69.7 kg  10/22/20 69.9 kg     Intake/Output Summary (Last 24 hours) at 11/09/2020 1413 Last data filed at 11/09/2020 1021 Gross per 24 hour  Intake --  Output 2400 ml  Net -2400 ml    Physical Exam:     alert and oriented x4, normal affect No new F.N deficits,  [place].AT, Normal respiratory effort on room air, CTAB RRR,No Gallops,Rubs or new Murmurs, Left AKA with dressing in place +ve B.Sounds, Abd Soft, No tenderness, No rebound, guarding or rigidity. No Cyanosis, No new Rash or bruise     I have personally reviewed the following:   Data Reviewed:  CBC Recent Labs  Lab 11/03/20 0358 11/07/20 0349 11/07/20 1430 11/08/20 0213 11/09/20 0343  WBC 15.7* 19.7*  --  14.1* 12.9*  HGB 10.5* 7.4* 7.7* 7.4* 7.2*  HCT 31.9* 23.3* 24.2* 22.9* 22.4*  PLT 248 183  --  197 205  MCV 86.2 88.3  --  88.4 90.0  MCH 28.4 28.0  --  28.6 28.9  MCHC 32.9 31.8  --  32.3 32.1  RDW 16.9* 18.0*  --  18.1* 18.6*  LYMPHSABS 0.8  --   --  1.3 1.5  MONOABS 0.8  --   --  1.0 1.0  EOSABS 0.1  --   --  0.3 0.4  BASOSABS 0.0  --   --  0.0 0.0    Chemistries  Recent Labs  Lab 11/05/20 0555 11/06/20 0413 11/07/20 0349 11/08/20 0213 11/09/20 0343  NA 136 137 139 140 137  K 4.5 4.2 3.7 4.2 3.7  CL 108 105 109 108 105  CO2 20* 20* 22 26 25   GLUCOSE 151* 105* 161* 114* 98  BUN 91* 80* 58* 49* 37*  CREATININE 2.11* 1.81* 1.34* 1.29* 1.12  CALCIUM 9.0 9.1 9.0 8.7* 8.2*   ------------------------------------------------------------------------------------------------------------------ No results for input(s): CHOL, HDL, LDLCALC, TRIG, CHOLHDL, LDLDIRECT in the last 72 hours.  Lab Results  Component Value Date   HGBA1C 7.1 (H) 10/31/2020   ------------------------------------------------------------------------------------------------------------------ No results for input(s): TSH, T4TOTAL, T3FREE, THYROIDAB in the last 72  hours.  Invalid input(s): FREET3 ------------------------------------------------------------------------------------------------------------------ No results for input(s): VITAMINB12, FOLATE, FERRITIN, TIBC, IRON, RETICCTPCT in the last 72 hours.  Coagulation profile No results for input(s): INR, PROTIME in the last 168 hours.  No results for input(s): DDIMER in the last 72 hours.  Cardiac Enzymes No results for input(s): CKMB, TROPONINI, MYOGLOBIN in the last 168 hours.  Invalid input(s): CK ------------------------------------------------------------------------------------------------------------------    Component Value Date/Time   BNP 488.4 (H) 10/30/2020 1820    Micro Results Recent Results (from the past 240 hour(s))  Respiratory Panel by RT PCR (Flu A&B, Covid) - Nasopharyngeal Swab     Status: None   Collection Time: 10/30/20  7:51 PM   Specimen: Nasopharyngeal Swab  Result Value Ref Range Status   SARS Coronavirus 2 by RT PCR NEGATIVE NEGATIVE Final    Comment: (NOTE) SARS-CoV-2 target nucleic acids are NOT DETECTED.  The SARS-CoV-2 RNA is generally detectable in upper respiratoy specimens during the acute phase of infection. The lowest concentration of SARS-CoV-2 viral copies this assay can detect is 131 copies/mL. A negative result does not preclude SARS-Cov-2 infection and should not be used as the sole basis for treatment or other patient management decisions. A negative result may occur with  improper specimen collection/handling, submission of specimen other than nasopharyngeal swab, presence of viral mutation(s) within the areas targeted by this assay, and inadequate number of viral copies (<131 copies/mL). A negative result must be combined with clinical observations, patient history, and epidemiological information. The expected result is Negative.  Fact Sheet for Patients:  PinkCheek.be  Fact Sheet for Healthcare  Providers:  GravelBags.it  This test is no t yet approved or cleared by the [place] FDA and  has been authorized for detection and/or diagnosis of SARS-CoV-2 by FDA under an Emergency Use Authorization (EUA). This EUA will remain  in effect (meaning this test can be used) for the duration of the COVID-19 declaration under Section 564(b)(1) of the Act, 21 U.S.C. section 360bbb-3(b)(1), unless the authorization is terminated or revoked sooner.     Influenza A by PCR NEGATIVE NEGATIVE Final   Influenza B by PCR NEGATIVE NEGATIVE Final    Comment: (NOTE) The Xpert  Xpress SARS-CoV-2/FLU/RSV assay is intended as an aid in  the diagnosis of influenza from Nasopharyngeal swab specimens and  should not be used as a sole basis for treatment. Nasal washings and  aspirates are unacceptable for Xpert Xpress SARS-CoV-2/FLU/RSV  testing.  Fact Sheet for Patients: PinkCheek.be  Fact Sheet for Healthcare Providers: GravelBags.it  This test is not yet approved or cleared by the [place] FDA and  has been authorized for detection and/or diagnosis of SARS-CoV-2 by  FDA under an Emergency Use Authorization (EUA). This EUA will remain  in effect (meaning this test can be used) for the duration of the  Covid-19 declaration under Section 564(b)(1) of the Act, 21  U.S.C. section 360bbb-3(b)(1), unless the authorization is  terminated or revoked. Performed at [facility], [place] [address], [place], [place] [address]   Culture, blood (x 2)     Status: None   Collection Time: 10/30/20  9:41 PM   Specimen: BLOOD  Result Value Ref Range Status   Specimen Description BLOOD RIGHT ANTECUBITAL  Final   Special Requests   Final    BOTTLES DRAWN AEROBIC AND ANAEROBIC Blood Culture adequate volume   Culture   Final    NO GROWTH 5 DAYS Performed at [facility], [place] [address] [place], [place]  [address]    Report Status 11/04/2020 FINAL  Final  Culture, blood (x 2)     Status: None   Collection Time: 10/31/20  1:20 AM   Specimen: BLOOD  Result Value Ref Range Status   Specimen Description BLOOD RIGHT ANTECUBITAL  Final   Special Requests   Final    BOTTLES DRAWN AEROBIC AND ANAEROBIC Blood Culture adequate volume   Culture   Final    NO GROWTH 5 DAYS Performed at [facility], [place] [address] [place], [place] [address]    Report Status 11/05/2020 FINAL  Final  Surgical PCR screen     Status: None   Collection Time: 11/06/20  2:40 AM   Specimen: Nasal Mucosa; Nasal Swab  Result Value Ref Range Status   MRSA, PCR NEGATIVE NEGATIVE Final   Staphylococcus aureus NEGATIVE NEGATIVE Final    Comment: (NOTE) The Xpert SA Assay (FDA approved for NASAL specimens in patients 60 years of age and older), is one component of a comprehensive surveillance program. It is not intended to diagnose infection nor to guide or monitor treatment. Performed at [facility], [place] [address] [place], [place] [address]     Radiology Reports US RENAL  Result Date: 11/08/2020 CLINICAL DATA:  Urinary retention EXAM: RENAL / URINARY TRACT ULTRASOUND COMPLETE COMPARISON:  10/17/2020 FINDINGS: Right Kidney: Renal measurements: 11.8 x 5.4 x 5.8 cm = volume: 194 mL. Echogenicity within normal limits. No mass or hydronephrosis visualized. Left Kidney: Renal measurements: 10.4 x 4.7 x 4.6 cm = volume: 119 mL. Echogenicity within normal limits. Incidental simple cyst measuring 1.5 cm. No mass or hydronephrosis visualized. Bladder: Prevoid urinary bladder volume 635 mL. Patient is unable to void at the time of examination. Other: None. IMPRESSION: 1. No hydronephrosis. 2. Prevoid urinary bladder volume 635 mL. Patient is unable to void at the time of examination. Nursing staff notified by sonographer. Electronically Signed   By: Eddie Candle M.D.   On: 11/08/2020 18:29   US RENAL  Result Date:  10/17/2020 CLINICAL DATA:  Acute kidney injury EXAM: RENAL / URINARY TRACT ULTRASOUND COMPLETE COMPARISON:  None. FINDINGS: Right Kidney: Renal measurements: 11.8  x 5.6 x 5.7 cm = volume: 197.9 mL. Echogenicity and renal cortical thickness are within normal limits. No mass, perinephric fluid, or hydronephrosis visualized. No sonographically demonstrable calculus or ureterectasis. Left Kidney: Renal measurements: 10.7 x 4.7 x 4.1 cm = volume: 109.5 mL. Echogenicity and renal cortical thickness are within normal limits. No perinephric fluid or hydronephrosis visualized. There is a cyst arising from the lower pole left kidney measuring 1.8 x 1.4 x 1.5 cm. No sonographically demonstrable calculus or ureterectasis. Bladder: Appears normal for degree of bladder distention. Other: None. IMPRESSION: Cyst arising from lower pole left kidney measuring 1.8 x 1.4 x 1.5 cm. Study otherwise unremarkable. Electronically Signed   By: Lowella Grip III M.D.   On: 10/17/2020 15:28   PERIPHERAL VASCULAR CATHETERIZATION  Result Date: 10/16/2020 Successful intravascular lithotripsy, PTCA and stenting     7.0X39 mm balloon expandable Viabahn VBX stent Rt common iliac artery     7.0X60 mm self expanding Absolute Pro stent Rt distal iliac artery No named vessels at ankle level in both lower extremities. Will consult vascular surgery for Rt fem-to-left profunda bypass given left foot critical limb ischemia. Nigel Mormon, MD Pager: [phone] Office: [phone]   DG Chest Portable 1 View  Result Date: 10/30/2020 CLINICAL DATA:  Chest pain EXAM: PORTABLE CHEST 1 VIEW COMPARISON:  08/03/2020 FINDINGS: Single frontal view of the chest demonstrates stenting artifact from external cardiac device. Cardiac silhouette is unremarkable. No airspace disease, effusion, or pneumothorax. No acute bony abnormalities. IMPRESSION: 1. No acute intrathoracic process. Electronically Signed   By: Randa Ngo M.D.   On: 10/30/2020  17:01   VAS US CAROTID  Result Date: 10/17/2020 Carotid Arterial Duplex Study Indications:  Pre-surgical evaluation. Risk Factors: Hypertension, hyperlipidemia, current smoker, coronary artery               disease, PAD. Performing Technologist: Maudry Mayhew MHA, RDMS, RVT, RDCS  Examination Guidelines: A complete evaluation includes B-mode imaging, spectral Doppler, color Doppler, and power Doppler as needed of all accessible portions of each vessel. Bilateral testing is considered an integral part of a complete examination. Limited examinations for reoccurring indications may be performed as noted.  Right Carotid Findings: +----------+--------+--------+--------+-----------------------+--------+           PSV cm/sEDV cm/sStenosisPlaque Description     Comments +----------+--------+--------+--------+-----------------------+--------+ CCA Prox  186                     smooth and heterogenous         +----------+--------+--------+--------+-----------------------+--------+ CCA Distal87      16              smooth and heterogenous         +----------+--------+--------+--------+-----------------------+--------+ ICA Prox  56      13              smooth and heterogenous         +----------+--------+--------+--------+-----------------------+--------+ ICA Distal70      15                                              +----------+--------+--------+--------+-----------------------+--------+ ECA       139     7               smooth and heterogenous         +----------+--------+--------+--------+-----------------------+--------+ +----------+--------+-------+----------------+-------------------+  PSV cm/sEDV cmsDescribe        Arm Pressure (mmHG) +----------+--------+-------+----------------+-------------------+ [id number]            Multiphasic, WNL                    +----------+--------+-------+----------------+-------------------+  +---------+--------+--+--------+--+---------+ VertebralPSV cm/s78EDV cm/s29Antegrade +---------+--------+--+--------+--+---------+  Left Carotid Findings: +----------+--------+--------+--------+-----------------------+--------+           PSV cm/sEDV cm/sStenosisPlaque Description     Comments +----------+--------+--------+--------+-----------------------+--------+ CCA Prox  207     10                                              +----------+--------+--------+--------+-----------------------+--------+ CCA [id number]     14              smooth and heterogenous         +----------+--------+--------+--------+-----------------------+--------+ ICA Prox  81      17              smooth and heterogenous         +----------+--------+--------+--------+-----------------------+--------+ ICA Distal109     29                                              +----------+--------+--------+--------+-----------------------+--------+ ECA       240     14              smooth and heterogenous         +----------+--------+--------+--------+-----------------------+--------+ +----------+--------+--------+--------+-------------------+           PSV cm/sEDV cm/sDescribeArm Pressure (mmHG) +----------+--------+--------+--------+-------------------+ Subclavian310                                         +----------+--------+--------+--------+-------------------+ +---------+--------+---+--------+--+---------------------------------+ VertebralPSV cm/s293EDV cm/s47>50% cervical stenosis, antegrade +---------+--------+---+--------+--+---------------------------------+   Summary: Right Carotid: Velocities in the right ICA are consistent with a 1-39% stenosis. Left Carotid: Velocities in the left ICA are consistent with a 1-39% stenosis. Vertebrals:  Bilateral vertebral arteries demonstrate antegrade flow. Left >50%              cervical stenosis. Subclavians: Normal flow hemodynamics were  seen in bilateral subclavian              arteries. *See table(s) above for measurements and observations.  Electronically signed by Harold Barban MD on 10/17/2020 at 7:38:49 PM.    Final      Time Spent in minutes  30     Desiree Hane M.D on 11/09/2020 at 2:13 PM  To page go to [email or web address] - password [email or web address]

## 2020-11-09 NOTE — Progress Notes (Signed)
56fr Foley catheter inserted without any difficulty via sterile method.  Peri care done before and after foley insertion.  577ml clear amber urine obtained from the bladder.  Sample for UA obtained and sent to lab.  Patient tolerated procedure well.  Marcille Blanco, RN

## 2020-11-09 NOTE — Progress Notes (Addendum)
   Left AKA stump healing well without erythema or drainage. Retention sock in place  S/P left AKA  Viable stump healing well States he thinks the neurotin is helping with the phantom pain. The staples will be maintained for 4 weeks  F/U in our office in 4 weeks the office will call  Roxy Horseman PA-C [facility] [phone]  I have examined the patient, reviewed and agree with above.  Comfortable overall.  Will not follow actively over the weekend.  Will see again next week.  Please call for any vascular issues  Curt Jews, MD 11/09/2020 1:55 PM

## 2020-11-09 NOTE — Progress Notes (Signed)
Resting this morning. Chart and telemetry reviewed. Continues to have improving renal function. No more sinus pauses overnight.  Awaiting placement. Will arrange outpatient follow up.   Nigel Mormon, MD Pager: [phone] Office: [phone]

## 2020-11-10 ENCOUNTER — Encounter (HOSPITAL_COMMUNITY): Payer: BC Managed Care – PPO

## 2020-11-10 DIAGNOSIS — I455 Other specified heart block: Secondary | ICD-10-CM

## 2020-11-10 DIAGNOSIS — I70229 Atherosclerosis of native arteries of extremities with rest pain, unspecified extremity: Secondary | ICD-10-CM | POA: Diagnosis not present

## 2020-11-10 DIAGNOSIS — I2510 Atherosclerotic heart disease of native coronary artery without angina pectoris: Secondary | ICD-10-CM | POA: Diagnosis not present

## 2020-11-10 DIAGNOSIS — R338 Other retention of urine: Secondary | ICD-10-CM | POA: Diagnosis not present

## 2020-11-10 LAB — TROPONIN I (HIGH SENSITIVITY): Troponin I (High Sensitivity): 80 ng/L — ABNORMAL HIGH (ref <18)

## 2020-11-10 LAB — GLUCOSE, CAPILLARY
Glucose-Capillary: 113 mg/dL — ABNORMAL HIGH (ref 70–99)
Glucose-Capillary: 118 mg/dL — ABNORMAL HIGH (ref 70–99)
Glucose-Capillary: 101 mg/dL — ABNORMAL HIGH (ref 70–99)
Glucose-Capillary: 140 mg/dL — ABNORMAL HIGH (ref 70–99)
Glucose-Capillary: 147 mg/dL — ABNORMAL HIGH (ref 70–99)
Glucose-Capillary: 159 mg/dL — ABNORMAL HIGH (ref 70–99)
Glucose-Capillary: 193 mg/dL — ABNORMAL HIGH (ref 70–99)

## 2020-11-10 LAB — CBC WITH DIFFERENTIAL/PLATELET
Abs Immature Granulocytes: 0.1 10*3/uL — ABNORMAL HIGH (ref 0.00–0.07)
Basophils Absolute: 0 10*3/uL (ref 0.0–0.1)
Basophils Relative: 0 %
Eosinophils Absolute: 0.4 10*3/uL (ref 0.0–0.5)
Eosinophils Relative: 4 %
HCT: 22.3 % — ABNORMAL LOW (ref 39.0–52.0)
Hemoglobin: 7 g/dL — ABNORMAL LOW (ref 13.0–17.0)
Immature Granulocytes: 1 %
Lymphocytes Relative: 12 %
Lymphs Abs: 1.2 10*3/uL (ref 0.7–4.0)
MCH: 27.9 pg (ref 26.0–34.0)
MCHC: 31.4 g/dL (ref 30.0–36.0)
MCV: 88.8 fL (ref 80.0–100.0)
Monocytes Absolute: 0.7 10*3/uL (ref 0.1–1.0)
Monocytes Relative: 7 %
Neutro Abs: 7.6 10*3/uL (ref 1.7–7.7)
Neutrophils Relative %: 76 %
Platelets: 191 10*3/uL (ref 150–400)
RBC: 2.51 MIL/uL — ABNORMAL LOW (ref 4.22–5.81)
RDW: 18.5 % — ABNORMAL HIGH (ref 11.5–15.5)
WBC: 10 10*3/uL (ref 4.0–10.5)
nRBC: 0 % (ref 0.0–0.2)

## 2020-11-10 LAB — POTASSIUM: Potassium: 4 mmol/L (ref 3.5–5.1)

## 2020-11-10 LAB — TSH: TSH: 4.755 u[IU]/mL — ABNORMAL HIGH (ref 0.350–4.500)

## 2020-11-10 LAB — MAGNESIUM: Magnesium: 1.8 mg/dL (ref 1.7–2.4)

## 2020-11-10 NOTE — Progress Notes (Signed)
MD notified of ongoing disorientation, removal of telemetry leads and IV, and attempted removal of foley. No new orders at this time.

## 2020-11-10 NOTE — Progress Notes (Signed)
Patient pulled his two IVs and was found by this nurse trying to pull his foley catheter.  Patient is alert and oriented x4 and able to answer all questions appropriately.  Patient states he does not know why he was pulling at lines and tubes.  No signs of distress noted.  Patient has been sleepy during shift but easy to arouse.  Poor appetite at meals.  New IV was inserted by this nurse, patient educated on safety.  Medicated for pain twice this shift.  Patient has good liquid intake, and had three ensures.  Vital signs stable.  RN to monitor patient.  Marcille Blanco, RN

## 2020-11-10 NOTE — Progress Notes (Signed)
[facility]  PROGRESS NOTE  Mike [name] VVO:[id number] DOB: Jul 12, 1961 DOA: 10/30/2020 PCP: Patient, No Pcp Per Admit date - 10/30/2020   Admitting Physician Eben Burow, MD  Outpatient Primary MD for the patient is Patient, No Pcp Per  LOS - 81 Brief Narrative   Mike [name] is a 59 year old male with medical history significant for STEMI (2/69/4854) complicated by cardiac arrest secondary to sides post PCI with chronic ST elevations in inferior leads since, severe bilateral renal artery stenosis, PAD with history of critical limb ischemia requiring right iliac revascularization of left lower extremity, chronic systolic CHF related to ischemic cardiomyopathy who presented on 11/2 with complaints of shortness of breath decrease ability to walk due to severe leg pain found to have critical limbischemia of left leg.   Patient has had ongoing issues with atherosclerotic vascular disease of the left leg but given his comorbidities (recent STEMI still requiring aspirin and Brilinta, CKD, depressed EF patient was too high risk for surgical revascularization per vascular surgery, but due to current hospitalization and further decompensation of symptoms after surgery elected to do above-knee amputation to deviate his rest pain.  Patient was transitioned from aspirin Brilinta aspirin and Plavix and underwent left AKA by Dr. Donnetta Hutching 11/9.  Subjective  Reports of long sinus pause overnight. Patient was asymptomatic and coreg discontinued at the time. Denies CP. Is sleepy.   A & P   Asymptomatic Sinus Pauses. Noted on telemetry in setting of coreg.  -monitor on telemetry -coreg discontinued -if reoccurs will consult cardiology -goal K> 4, Mg> 2  Severe PAD with gangrene of left leg with no revascularization options, status post left AKA due to gangrene and intolerable pain/rest pain below/9.  Pain is much better controlled.  OFF PCA pump. Stump healing well  -Oxycontin  discontinued -IV fentanyl to 50 mcg per severe breakthrough pain as needed every 2 hours -Oral oxycodone 5 mg IR, every 4 hours as needed moderate pain --vascular outpatient follow up w/Dr. Donnetta Hutching in 4 weeks (his office will call), maintain staples for 4 weeks.   AKI on CKD stage III, improved.  Occurred in setting of contrast received after peripheral arteriogram.  Creatinine back at baseline -Avoid nephrotoxins  Chronic systolic failure in setting of ischemic cardiomyopathy (recent STEMI in August 2020) history of), euvolemic on exam -Coreg discontinued given sinus pause overnight -D/c entresto due to low BP -Monitor renal function, avoid dehydration/hypotension -Patient is DNR, cardiology does not recommend removing LifeVest or ICD given risk for infection -Continue Corlanor -Continue aspirin and Plavix -- crestor 10 mg  AKI on CKD stage III.  Prior to this hospitalization previous creatinine baseline of 1.16.  Peak creatinine this admission of 6.34, has continued to downtrend, now 1.34.  Suspect was prerenal etiology in the setting of NSAIDs and contrast from arteriogram this hospital stay -Avoid nephrotoxins -Monitor BMP  Chronic hypotension,stable.  SBP stable ranging 90s-low 100s.   -entresto discontinued due to low BP --coreg discontinued due to pauses  Mild anion gap metabolic acidosis likely in the setting of CKD now resolved. nadir of 11 -Continue sodium bicarb  Type 2 diabetes, A1c 7.1. glucose at goal -Sliding scale insulin as needed, monitor CBGs  Acute urinary retention. Suspect related to recent surgery and decreased mobility. UA unremarkable. In and out caht x2 for urine retention x 500 cc in 24 hours. Foley placed on 11/12 -continue foley and due voiding trial likely on discharge -if persists will need urology follow up for outpatient evaluation.  Normocytic anemia. Acutely worsened from postoperative anemia. Hgb has been stable at 7. 12.7 since admission. No  current signs or symptoms of bleeding. -on aspirin and plavix -monitor cbc, transfuse if hgb less than 7 -daily cbc    Family Communication  : None at bedside, sister updated at bedside on 11/12  Code Status : DNR  Disposition Plan  :  Patient is from home. Anticipated d/c date: 1 to 2 days likely to SNF versus CIR. Barriers to d/c or necessity for inpatient status:monitor telemetry to ensure no recurrent sinus pauses, monitor hgb on DAPT Consults  : Nephrology, vascular surgery, cardiology  Procedures  :    DVT Prophylaxis  : Heparin MDM: The below labs and imaging reports were reviewed and summarized above.  Medication management as above.  Lab Results  Component Value Date   PLT 191 11/10/2020    Diet :  Diet Order            Diet Heart Room service appropriate? Yes; Fluid consistency: Thin  Diet effective now                  Inpatient Medications Scheduled Meds: . acetaminophen  1,000 mg Oral Once  . aspirin EC  81 mg Oral Daily  . clopidogrel  75 mg Oral Daily  . feeding supplement (GLUCERNA SHAKE)  237 mL Oral QID  . gabapentin  300 mg Oral BID  . Gerhardt's butt cream   Topical BID  . insulin aspart  0-6 Units Subcutaneous Q4H  . ivabradine  2.5 mg Oral BID WC  . multivitamin with minerals  1 tablet Oral Daily  . nicotine  7 mg Transdermal Q24H  . polyethylene glycol  17 g Oral BID  . rosuvastatin  10 mg Oral Daily  . senna-docusate  2 tablet Oral BID  . sodium bicarbonate  1,300 mg Oral BID   Continuous Infusions: . sodium chloride 10 mL/hr at 11/02/20 2000   PRN Meds:.sodium chloride, acetaminophen **OR** acetaminophen, fentaNYL (SUBLIMAZE) injection, nitroGLYCERIN, oxyCODONE  Antibiotics  :   Anti-infectives (From admission, onward)   Start     Dose/Rate Route Frequency Ordered Stop   11/06/20 0930  ceFAZolin (ANCEF) IVPB 2g/100 mL premix  Status:  Discontinued        2 g 200 mL/hr over 30 Minutes Intravenous To Surgery 11/06/20 0922 11/06/20  1340       Objective   Vitals:   11/10/20 0358 11/10/20 0405 11/10/20 0933 11/10/20 1512  BP: (!) 94/52  (!) 115/59 125/62  Pulse: (!) 58  66 65  Resp: 19   18  Temp: 98.9 F (37.2 C)  97.8 F (36.6 C) 98.5 F (36.9 C)  TempSrc: Oral  Oral Oral  SpO2: 97%  99% 90%  Weight:  65 kg    Height:        SpO2: 90 % O2 Flow Rate (L/min): 2 L/min  Wt Readings from Last 3 Encounters:  11/10/20 65 kg  10/26/20 69.7 kg  10/22/20 69.9 kg     Intake/Output Summary (Last 24 hours) at 11/10/2020 1615 Last data filed at 11/10/2020 1300 Gross per 24 hour  Intake 437 ml  Output 1400 ml  Net -963 ml    Physical Exam:     alert and oriented x4, normal affect No new F.N deficits,  [place].AT, Normal respiratory effort on room air, CTAB RRR,No Gallops,Rubs or new Murmurs, Left AKA with dressing in place +ve B.Sounds, Abd Soft, No tenderness, No rebound,  guarding or rigidity. No Cyanosis, No new Rash or bruise     I have personally reviewed the following:   Data Reviewed:  CBC Recent Labs  Lab 11/07/20 0349 11/07/20 1430 11/08/20 0213 11/09/20 0343 11/10/20 0449  WBC 19.7*  --  14.1* 12.9* 10.0  HGB 7.4* 7.7* 7.4* 7.2* 7.0*  HCT 23.3* 24.2* 22.9* 22.4* 22.3*  PLT 183  --  197 205 191  MCV 88.3  --  88.4 90.0 88.8  MCH 28.0  --  28.6 28.9 27.9  MCHC 31.8  --  32.3 32.1 31.4  RDW 18.0*  --  18.1* 18.6* 18.5*  LYMPHSABS  --   --  1.3 1.5 1.2  MONOABS  --   --  1.0 1.0 0.7  EOSABS  --   --  0.3 0.4 0.4  BASOSABS  --   --  0.0 0.0 0.0    Chemistries  Recent Labs  Lab 11/05/20 0555 11/06/20 0413 11/07/20 0349 11/08/20 0213 11/09/20 0343  NA 136 137 139 140 137  K 4.5 4.2 3.7 4.2 3.7  CL 108 105 109 108 105  CO2 20* 20* 22 26 25   GLUCOSE 151* 105* 161* 114* 98  BUN 91* 80* 58* 49* 37*  CREATININE 2.11* 1.81* 1.34* 1.29* 1.12  CALCIUM 9.0 9.1 9.0 8.7* 8.2*    ------------------------------------------------------------------------------------------------------------------ No results for input(s): CHOL, HDL, LDLCALC, TRIG, CHOLHDL, LDLDIRECT in the last 72 hours.  Lab Results  Component Value Date   HGBA1C 7.1 (H) 10/31/2020   ------------------------------------------------------------------------------------------------------------------ Recent Labs    11/10/20 0544  TSH 4.755*   ------------------------------------------------------------------------------------------------------------------ No results for input(s): VITAMINB12, FOLATE, FERRITIN, TIBC, IRON, RETICCTPCT in the last 72 hours.  Coagulation profile No results for input(s): INR, PROTIME in the last 168 hours.  No results for input(s): DDIMER in the last 72 hours.  Cardiac Enzymes No results for input(s): CKMB, TROPONINI, MYOGLOBIN in the last 168 hours.  Invalid input(s): CK ------------------------------------------------------------------------------------------------------------------    Component Value Date/Time   BNP 488.4 (H) 10/30/2020 1820    Micro Results Recent Results (from the past 240 hour(s))  Surgical PCR screen     Status: None   Collection Time: 11/06/20  2:40 AM   Specimen: Nasal Mucosa; Nasal Swab  Result Value Ref Range Status   MRSA, PCR NEGATIVE NEGATIVE Final   Staphylococcus aureus NEGATIVE NEGATIVE Final    Comment: (NOTE) The Xpert SA Assay (FDA approved for NASAL specimens in patients 44 years of age and older), is one component of a comprehensive surveillance program. It is not intended to diagnose infection nor to guide or monitor treatment. Performed at [facility], [place] [address], [place], [place] [address]     Radiology Reports US RENAL  Result Date: 11/08/2020 CLINICAL DATA:  Urinary retention EXAM: RENAL / URINARY TRACT ULTRASOUND COMPLETE COMPARISON:  10/17/2020 FINDINGS: Right Kidney: Renal measurements: 11.8  x 5.4 x 5.8 cm = volume: 194 mL. Echogenicity within normal limits. No mass or hydronephrosis visualized. Left Kidney: Renal measurements: 10.4 x 4.7 x 4.6 cm = volume: 119 mL. Echogenicity within normal limits. Incidental simple cyst measuring 1.5 cm. No mass or hydronephrosis visualized. Bladder: Prevoid urinary bladder volume 635 mL. Patient is unable to void at the time of examination. Other: None. IMPRESSION: 1. No hydronephrosis. 2. Prevoid urinary bladder volume 635 mL. Patient is unable to void at the time of examination. Nursing staff notified by sonographer. Electronically Signed   By: Eddie Candle M.D.   On: 11/08/2020  18:29   US RENAL  Result Date: 10/17/2020 CLINICAL DATA:  Acute kidney injury EXAM: RENAL / URINARY TRACT ULTRASOUND COMPLETE COMPARISON:  None. FINDINGS: Right Kidney: Renal measurements: 11.8 x 5.6 x 5.7 cm = volume: 197.9 mL. Echogenicity and renal cortical thickness are within normal limits. No mass, perinephric fluid, or hydronephrosis visualized. No sonographically demonstrable calculus or ureterectasis. Left Kidney: Renal measurements: 10.7 x 4.7 x 4.1 cm = volume: 109.5 mL. Echogenicity and renal cortical thickness are within normal limits. No perinephric fluid or hydronephrosis visualized. There is a cyst arising from the lower pole left kidney measuring 1.8 x 1.4 x 1.5 cm. No sonographically demonstrable calculus or ureterectasis. Bladder: Appears normal for degree of bladder distention. Other: None. IMPRESSION: Cyst arising from lower pole left kidney measuring 1.8 x 1.4 x 1.5 cm. Study otherwise unremarkable. Electronically Signed   By: Lowella Grip III M.D.   On: 10/17/2020 15:28   PERIPHERAL VASCULAR CATHETERIZATION  Result Date: 10/16/2020 Successful intravascular lithotripsy, PTCA and stenting     7.0X39 mm balloon expandable Viabahn VBX stent Rt common iliac artery     7.0X60 mm self expanding Absolute Pro stent Rt distal iliac artery No named vessels at  ankle level in both lower extremities. Will consult vascular surgery for Rt fem-to-left profunda bypass given left foot critical limb ischemia. Nigel Mormon, MD Pager: [phone] Office: [phone]   DG Chest Portable 1 View  Result Date: 10/30/2020 CLINICAL DATA:  Chest pain EXAM: PORTABLE CHEST 1 VIEW COMPARISON:  08/03/2020 FINDINGS: Single frontal view of the chest demonstrates stenting artifact from external cardiac device. Cardiac silhouette is unremarkable. No airspace disease, effusion, or pneumothorax. No acute bony abnormalities. IMPRESSION: 1. No acute intrathoracic process. Electronically Signed   By: Randa Ngo M.D.   On: 10/30/2020 17:01   VAS US CAROTID  Result Date: 10/17/2020 Carotid Arterial Duplex Study Indications:  Pre-surgical evaluation. Risk Factors: Hypertension, hyperlipidemia, current smoker, coronary artery               disease, PAD. Performing Technologist: Maudry Mayhew MHA, RDMS, RVT, RDCS  Examination Guidelines: A complete evaluation includes B-mode imaging, spectral Doppler, color Doppler, and power Doppler as needed of all accessible portions of each vessel. Bilateral testing is considered an integral part of a complete examination. Limited examinations for reoccurring indications may be performed as noted.  Right Carotid Findings: +----------+--------+--------+--------+-----------------------+--------+           PSV cm/sEDV cm/sStenosisPlaque Description     Comments +----------+--------+--------+--------+-----------------------+--------+ CCA Prox  186                     smooth and heterogenous         +----------+--------+--------+--------+-----------------------+--------+ CCA Distal87      16              smooth and heterogenous         +----------+--------+--------+--------+-----------------------+--------+ ICA Prox  56      13              smooth and heterogenous          +----------+--------+--------+--------+-----------------------+--------+ ICA Distal70      15                                              +----------+--------+--------+--------+-----------------------+--------+ ECA       139  7               smooth and heterogenous         +----------+--------+--------+--------+-----------------------+--------+ +----------+--------+-------+----------------+-------------------+           PSV cm/sEDV cmsDescribe        Arm Pressure (mmHG) +----------+--------+-------+----------------+-------------------+ [id number]            Multiphasic, WNL                    +----------+--------+-------+----------------+-------------------+ +---------+--------+--+--------+--+---------+ VertebralPSV cm/s78EDV cm/s29Antegrade +---------+--------+--+--------+--+---------+  Left Carotid Findings: +----------+--------+--------+--------+-----------------------+--------+           PSV cm/sEDV cm/sStenosisPlaque Description     Comments +----------+--------+--------+--------+-----------------------+--------+ CCA Prox  207     10                                              +----------+--------+--------+--------+-----------------------+--------+ CCA [id number]     14              smooth and heterogenous         +----------+--------+--------+--------+-----------------------+--------+ ICA Prox  81      17              smooth and heterogenous         +----------+--------+--------+--------+-----------------------+--------+ ICA Distal109     29                                              +----------+--------+--------+--------+-----------------------+--------+ ECA       240     14              smooth and heterogenous         +----------+--------+--------+--------+-----------------------+--------+ +----------+--------+--------+--------+-------------------+           PSV cm/sEDV cm/sDescribeArm Pressure (mmHG)  +----------+--------+--------+--------+-------------------+ Subclavian310                                         +----------+--------+--------+--------+-------------------+ +---------+--------+---+--------+--+---------------------------------+ VertebralPSV cm/s293EDV cm/s47>50% cervical stenosis, antegrade +---------+--------+---+--------+--+---------------------------------+   Summary: Right Carotid: Velocities in the right ICA are consistent with a 1-39% stenosis. Left Carotid: Velocities in the left ICA are consistent with a 1-39% stenosis. Vertebrals:  Bilateral vertebral arteries demonstrate antegrade flow. Left >50%              cervical stenosis. Subclavians: Normal flow hemodynamics were seen in bilateral subclavian              arteries. *See table(s) above for measurements and observations.  Electronically signed by Harold Barban MD on 10/17/2020 at 7:38:49 PM.    Final      Time Spent in minutes  30     Desiree Hane M.D on 11/10/2020 at 4:15 PM  To page go to [email or web address] - password [facility]

## 2020-11-10 NOTE — Progress Notes (Deleted)
Addendum

## 2020-11-10 NOTE — Progress Notes (Signed)
Patient had 10sec pause, MD notified.

## 2020-11-11 ENCOUNTER — Encounter (HOSPITAL_COMMUNITY): Payer: BC Managed Care – PPO

## 2020-11-11 ENCOUNTER — Inpatient Hospital Stay (HOSPITAL_COMMUNITY): Payer: BC Managed Care – PPO

## 2020-11-11 DIAGNOSIS — R410 Disorientation, unspecified: Secondary | ICD-10-CM | POA: Diagnosis not present

## 2020-11-11 DIAGNOSIS — R509 Fever, unspecified: Secondary | ICD-10-CM | POA: Diagnosis not present

## 2020-11-11 DIAGNOSIS — I959 Hypotension, unspecified: Secondary | ICD-10-CM | POA: Diagnosis not present

## 2020-11-11 DIAGNOSIS — I5042 Chronic combined systolic (congestive) and diastolic (congestive) heart failure: Secondary | ICD-10-CM | POA: Diagnosis not present

## 2020-11-11 LAB — GLUCOSE, CAPILLARY
Glucose-Capillary: 104 mg/dL — ABNORMAL HIGH (ref 70–99)
Glucose-Capillary: 109 mg/dL — ABNORMAL HIGH (ref 70–99)
Glucose-Capillary: 111 mg/dL — ABNORMAL HIGH (ref 70–99)
Glucose-Capillary: 119 mg/dL — ABNORMAL HIGH (ref 70–99)
Glucose-Capillary: 121 mg/dL — ABNORMAL HIGH (ref 70–99)
Glucose-Capillary: 161 mg/dL — ABNORMAL HIGH (ref 70–99)

## 2020-11-11 LAB — CBC WITH DIFFERENTIAL/PLATELET
Abs Immature Granulocytes: 0.09 10*3/uL — ABNORMAL HIGH (ref 0.00–0.07)
Basophils Absolute: 0 10*3/uL (ref 0.0–0.1)
Basophils Relative: 0 %
Eosinophils Absolute: 0.3 10*3/uL (ref 0.0–0.5)
Eosinophils Relative: 2 %
HCT: 25.1 % — ABNORMAL LOW (ref 39.0–52.0)
Hemoglobin: 7.8 g/dL — ABNORMAL LOW (ref 13.0–17.0)
Immature Granulocytes: 1 %
Lymphocytes Relative: 6 %
Lymphs Abs: 0.7 10*3/uL (ref 0.7–4.0)
MCH: 27.8 pg (ref 26.0–34.0)
MCHC: 31.1 g/dL (ref 30.0–36.0)
MCV: 89.3 fL (ref 80.0–100.0)
Monocytes Absolute: 0.8 10*3/uL (ref 0.1–1.0)
Monocytes Relative: 6 %
Neutro Abs: 10.4 10*3/uL — ABNORMAL HIGH (ref 1.7–7.7)
Neutrophils Relative %: 85 %
Platelets: 226 10*3/uL (ref 150–400)
RBC: 2.81 MIL/uL — ABNORMAL LOW (ref 4.22–5.81)
RDW: 18.4 % — ABNORMAL HIGH (ref 11.5–15.5)
WBC: 12.3 10*3/uL — ABNORMAL HIGH (ref 4.0–10.5)
nRBC: 0 % (ref 0.0–0.2)

## 2020-11-11 LAB — URINALYSIS, ROUTINE W REFLEX MICROSCOPIC
Bilirubin Urine: NEGATIVE
Glucose, UA: NEGATIVE mg/dL
Hgb urine dipstick: NEGATIVE
Ketones, ur: NEGATIVE mg/dL
Nitrite: NEGATIVE
Protein, ur: 30 mg/dL — AB
Specific Gravity, Urine: 1.017 (ref 1.005–1.030)
pH: 8 (ref 5.0–8.0)

## 2020-11-11 LAB — BASIC METABOLIC PANEL
Anion gap: 9 (ref 5–15)
BUN: 24 mg/dL — ABNORMAL HIGH (ref 8–23)
CO2: 24 mmol/L (ref 22–32)
Calcium: 8.3 mg/dL — ABNORMAL LOW (ref 8.9–10.3)
Chloride: 104 mmol/L (ref 98–111)
Creatinine, Ser: 1.06 mg/dL (ref 0.61–1.24)
GFR, Estimated: >60 mL/min (ref >60)
Glucose, Bld: 128 mg/dL — ABNORMAL HIGH (ref 70–99)
Potassium: 3.4 mmol/L — ABNORMAL LOW (ref 3.5–5.1)
Sodium: 137 mmol/L (ref 135–145)

## 2020-11-11 LAB — AMMONIA: Ammonia: 14 umol/L (ref 9–35)

## 2020-11-11 LAB — MAGNESIUM: Magnesium: 1.7 mg/dL (ref 1.7–2.4)

## 2020-11-11 MED ORDER — OXYCODONE HCL 5 MG PO TABS
5.0000 mg | ORAL_TABLET | Freq: Four times a day (QID) | ORAL | Status: DC | PRN
  Administered 2020-11-12 – 2020-11-13 (×3): 5 mg via ORAL
  Filled 2020-11-11 (×3): qty 1

## 2020-11-11 MED ORDER — MAGNESIUM SULFATE 4 GM/100ML IV SOLN
4.0000 g | Freq: Once | INTRAVENOUS | Status: DC
  Filled 2020-11-11: qty 100

## 2020-11-11 MED ORDER — POTASSIUM CHLORIDE CRYS ER 20 MEQ PO TBCR
40.0000 meq | EXTENDED_RELEASE_TABLET | Freq: Once | ORAL | Status: AC
  Administered 2020-11-11: 40 meq via ORAL
  Filled 2020-11-11: qty 2

## 2020-11-11 NOTE — Progress Notes (Signed)
[facility]  PROGRESS NOTE  Mike [name] DZH:[id number] DOB: 1961-10-18 DOA: 10/30/2020 PCP: Patient, No Pcp Per Admit date - 10/30/2020   Admitting Physician Eben Burow, MD  Outpatient Primary MD for the patient is Patient, No Pcp Per  LOS - 12 Brief Narrative   Mike [name] is a 59 year old male with medical history significant for STEMI (04/16/6221) complicated by cardiac arrest secondary to sides post PCI with chronic ST elevations in inferior leads since, severe bilateral renal artery stenosis, PAD with history of critical limb ischemia requiring right iliac revascularization of left lower extremity, chronic systolic CHF related to ischemic cardiomyopathy who presented on 11/2 with complaints of shortness of breath decrease ability to walk due to severe leg pain found to have critical limbischemia of left leg.   Patient has had ongoing issues with atherosclerotic vascular disease of the left leg but given his comorbidities (recent STEMI still requiring aspirin and Brilinta, CKD, depressed EF patient was too high risk for surgical revascularization per vascular surgery, but due to current hospitalization and further decompensation of symptoms after surgery elected to do above-knee amputation to deviate his rest pain.  Patient was transitioned from aspirin Brilinta aspirin and Plavix and underwent left AKA by Dr. Donnetta Hutching 11/9.  Subjective  Overnight reported confusion and pulled out IV lines.  Also had T-max of 101.3.  States his pain in his left leg is well controlled.  Denies any cough.  Denies any shortness of breath.  No chest pain.  A & P   Fever, unclear etiology.  Has no localizing symptoms of infection.  Chest x-ray shows likely atelectasis.  Patient has no cough.  Patient was confused overnight but now currently alert and oriented x4.  White count is downtrending.  Stump site is healing well with no signs or symptoms of infection -Encourage incentive spirometry,  atelectasis could be contributing -Monitor blood cultures -If fever reoccurs/persists may need to rule out Covid, and start antibiotics for potential bacterial infection  Confusion, intermittent.  Disorientation overnight.  Currently alert and oriented x4.  Could be hospital-acquired delirium.  Also concern this could be related to possible infection as mentioned above. Ammonia unremarkable, glucose unremarkable -Continue to closely monitor mental status -Delirium precautions  Asymptomatic Sinus Pauses. Noted on telemetry in setting of coreg.  -monitor on telemetry -coreg discontinued -if reoccurs will consult cardiology -goal K> 4, Mg> 2, replete as necessary  Severe PAD with gangrene of left leg with no revascularization options, status post left AKA due to gangrene and intolerable pain/rest pain below/9.  Pain is much better controlled.  OFF PCA pump. Stump healing well  -Oxycontin discontinued -IV fentanyl to 50 mcg per severe breakthrough pain as needed every 2 hours -Oral oxycodone 5 mg IR, every 4 hours as needed moderate pain --vascular outpatient follow up w/Dr. Donnetta Hutching in 4 weeks (his office will call), maintain staples for 4 weeks.   AKI on CKD stage III, improved.  Occurred in setting of contrast received after peripheral arteriogram.  Creatinine back at baseline -Avoid nephrotoxins  Chronic systolic failure in setting of ischemic cardiomyopathy (recent STEMI in August 2020) history of), euvolemic on exam -Coreg discontinued given sinus pause overnight -D/c entresto due to low BP -Monitor renal function, avoid dehydration/hypotension -Patient is DNR, cardiology does not recommend removing LifeVest or ICD given risk for infection -Continue Corlanor -Continue aspirin and Plavix -- crestor 10 mg  AKI on CKD stage III, resolved.  Prior to this hospitalization previous creatinine baseline of 1.06.  Peak creatinine this admission of 6.34, has continued to downtrend, now 1.34.   Suspect was prerenal etiology in the setting of NSAIDs and contrast from arteriogram this hospital stay -Avoid nephrotoxins -Monitor BMP  Chronic hypotension,stable.  SBP stable ranging 90s-low 100s.   -entresto discontinued due to low BP --coreg discontinued due to sinus pauses  Mild anion gap metabolic acidosis likely in the setting of CKD now resolved. nadir of 11 -Continue sodium bicarb  Type 2 diabetes, A1c 7.1. glucose at goal -Sliding scale insulin as needed, monitor CBGs  Acute urinary retention. Suspect related to recent surgery and decreased mobility. UA unremarkable. In and out caht x2 for urine retention x 500 cc in 24 hours. Foley placed on 11/12 -continue foley and due voiding trial likely on discharge -if persists will need urology follow up for outpatient evaluation.  Normocytic anemia. Acutely worsened from postoperative anemia. Hgb has been stable at 7 since admission. No current signs or symptoms of bleeding. -on aspirin and plavix -monitor cbc, transfuse if hgb less than 7 -daily cbc    Family Communication  : None at bedside, sister updated at bedside on 11/12  Code Status : DNR  Disposition Plan  :  Patient is from home. Anticipated d/c date: 1 to 2 days likely to SNF versus CIR. Barriers to d/c or necessity for inpatient status:monitor telemetry to ensure no recurrent sinus pauses, monitor hgb on DAPT Consults  : Nephrology, vascular surgery, cardiology  Procedures  :    DVT Prophylaxis  : Heparin MDM: The below labs and imaging reports were reviewed and summarized above.  Medication management as above.  Lab Results  Component Value Date   PLT 226 11/11/2020    Diet :  Diet Order            Diet Heart Room service appropriate? Yes; Fluid consistency: Thin  Diet effective now                  Inpatient Medications Scheduled Meds: . acetaminophen  1,000 mg Oral Once  . aspirin EC  81 mg Oral Daily  . clopidogrel  75 mg Oral Daily  .  feeding supplement (GLUCERNA SHAKE)  237 mL Oral QID  . gabapentin  300 mg Oral BID  . Gerhardt's butt cream   Topical BID  . insulin aspart  0-6 Units Subcutaneous Q4H  . ivabradine  2.5 mg Oral BID WC  . multivitamin with minerals  1 tablet Oral Daily  . nicotine  7 mg Transdermal Q24H  . polyethylene glycol  17 g Oral BID  . rosuvastatin  10 mg Oral Daily  . senna-docusate  2 tablet Oral BID  . sodium bicarbonate  1,300 mg Oral BID   Continuous Infusions: . sodium chloride 10 mL/hr at 11/02/20 2000   PRN Meds:.sodium chloride, acetaminophen **OR** acetaminophen, nitroGLYCERIN, oxyCODONE  Antibiotics  :   Anti-infectives (From admission, onward)   Start     Dose/Rate Route Frequency Ordered Stop   11/06/20 0930  ceFAZolin (ANCEF) IVPB 2g/100 mL premix  Status:  Discontinued        2 g 200 mL/hr over 30 Minutes Intravenous To Surgery 11/06/20 0922 11/06/20 1340       Objective   Vitals:   11/10/20 1934 11/11/20 0507 11/11/20 0511 11/11/20 0742  BP: 119/64  (!) 109/57 109/65  Pulse: 74  71 75  Resp: 18  18 18   Temp: (!) 100.4 F (38 C)  (!) 101.3 F (38.5 C) 98.6  F (37 C)  TempSrc: Oral  Oral Oral  SpO2: 98%  96% 95%  Weight:  63.8 kg    Height:        SpO2: 95 % O2 Flow Rate (L/min): 2 L/min  Wt Readings from Last 3 Encounters:  11/11/20 63.8 kg  10/26/20 69.7 kg  10/22/20 69.9 kg     Intake/Output Summary (Last 24 hours) at 11/11/2020 1141 Last data filed at 11/10/2020 1934 Gross per 24 hour  Intake 1010 ml  Output 900 ml  Net 110 ml    Physical Exam:     alert and oriented x4, normal affect No new F.N deficits,  [place].AT, Normal respiratory effort on room air, CTAB RRR,No Gallops,Rubs or new Murmurs, Left AKA with dressing in place      +ve B.Sounds, Abd Soft, No tenderness, No rebound, guarding or rigidity. No Cyanosis, No new Rash or bruise     I have personally reviewed the following:   Data Reviewed:  CBC Recent Labs  Lab  11/07/20 0349 11/07/20 0349 11/07/20 1430 11/08/20 0213 11/09/20 0343 11/10/20 0449 11/11/20 0141  WBC 19.7*  --   --  14.1* 12.9* 10.0 12.3*  HGB 7.4*   < > 7.7* 7.4* 7.2* 7.0* 7.8*  HCT 23.3*   < > 24.2* 22.9* 22.4* 22.3* 25.1*  PLT 183  --   --  197 205 191 226  MCV 88.3  --   --  88.4 90.0 88.8 89.3  MCH 28.0  --   --  28.6 28.9 27.9 27.8  MCHC 31.8  --   --  32.3 32.1 31.4 31.1  RDW 18.0*  --   --  18.1* 18.6* 18.5* 18.4*  LYMPHSABS  --   --   --  1.3 1.5 1.2 0.7  MONOABS  --   --   --  1.0 1.0 0.7 0.8  EOSABS  --   --   --  0.3 0.4 0.4 0.3  BASOSABS  --   --   --  0.0 0.0 0.0 0.0   < > = values in this interval not displayed.    Chemistries  Recent Labs  Lab 11/06/20 0413 11/06/20 0413 11/07/20 0349 11/08/20 0213 11/09/20 0343 11/10/20 0544 11/11/20 0141  NA 137  --  139 140 137  --  137  K 4.2   < > 3.7 4.2 3.7 4.0 3.4*  CL 105  --  109 108 105  --  104  CO2 20*  --  22 26 25   --  24  GLUCOSE 105*  --  161* 114* 98  --  128*  BUN 80*  --  58* 49* 37*  --  24*  CREATININE 1.81*  --  1.34* 1.29* 1.12  --  1.06  CALCIUM 9.1  --  9.0 8.7* 8.2*  --  8.3*  MG  --   --   --   --   --  1.8 1.7   < > = values in this interval not displayed.   ------------------------------------------------------------------------------------------------------------------ No results for input(s): CHOL, HDL, LDLCALC, TRIG, CHOLHDL, LDLDIRECT in the last 72 hours.  Lab Results  Component Value Date   HGBA1C 7.1 (H) 10/31/2020   ------------------------------------------------------------------------------------------------------------------ Recent Labs    11/10/20 0544  TSH 4.755*   ------------------------------------------------------------------------------------------------------------------ No results for input(s): VITAMINB12, FOLATE, FERRITIN, TIBC, IRON, RETICCTPCT in the last 72 hours.  Coagulation profile No results for input(s): INR, PROTIME in the last 168  hours.  No results for  input(s): DDIMER in the last 72 hours.  Cardiac Enzymes No results for input(s): CKMB, TROPONINI, MYOGLOBIN in the last 168 hours.  Invalid input(s): CK ------------------------------------------------------------------------------------------------------------------    Component Value Date/Time   BNP 488.4 (H) 10/30/2020 1820    Micro Results Recent Results (from the past 240 hour(s))  Surgical PCR screen     Status: None   Collection Time: 11/06/20  2:40 AM   Specimen: Nasal Mucosa; Nasal Swab  Result Value Ref Range Status   MRSA, PCR NEGATIVE NEGATIVE Final   Staphylococcus aureus NEGATIVE NEGATIVE Final    Comment: (NOTE) The Xpert SA Assay (FDA approved for NASAL specimens in patients 58 years of age and older), is one component of a comprehensive surveillance program. It is not intended to diagnose infection nor to guide or monitor treatment. Performed at [facility], [place] [address] [place], [place] [address]     Radiology Reports US RENAL  Result Date: 11/08/2020 CLINICAL DATA:  Urinary retention EXAM: RENAL / URINARY TRACT ULTRASOUND COMPLETE COMPARISON:  10/17/2020 FINDINGS: Right Kidney: Renal measurements: 11.8 x 5.4 x 5.8 cm = volume: 194 mL. Echogenicity within normal limits. No mass or hydronephrosis visualized. Left Kidney: Renal measurements: 10.4 x 4.7 x 4.6 cm = volume: 119 mL. Echogenicity within normal limits. Incidental simple cyst measuring 1.5 cm. No mass or hydronephrosis visualized. Bladder: Prevoid urinary bladder volume 635 mL. Patient is unable to void at the time of examination. Other: None. IMPRESSION: 1. No hydronephrosis. 2. Prevoid urinary bladder volume 635 mL. Patient is unable to void at the time of examination. Nursing staff notified by sonographer. Electronically Signed   By: Eddie Candle M.D.   On: 11/08/2020 18:29   US RENAL  Result Date: 10/17/2020 CLINICAL DATA:  Acute kidney injury EXAM: RENAL /  URINARY TRACT ULTRASOUND COMPLETE COMPARISON:  None. FINDINGS: Right Kidney: Renal measurements: 11.8 x 5.6 x 5.7 cm = volume: 197.9 mL. Echogenicity and renal cortical thickness are within normal limits. No mass, perinephric fluid, or hydronephrosis visualized. No sonographically demonstrable calculus or ureterectasis. Left Kidney: Renal measurements: 10.7 x 4.7 x 4.1 cm = volume: 109.5 mL. Echogenicity and renal cortical thickness are within normal limits. No perinephric fluid or hydronephrosis visualized. There is a cyst arising from the lower pole left kidney measuring 1.8 x 1.4 x 1.5 cm. No sonographically demonstrable calculus or ureterectasis. Bladder: Appears normal for degree of bladder distention. Other: None. IMPRESSION: Cyst arising from lower pole left kidney measuring 1.8 x 1.4 x 1.5 cm. Study otherwise unremarkable. Electronically Signed   By: Lowella Grip III M.D.   On: 10/17/2020 15:28   PERIPHERAL VASCULAR CATHETERIZATION  Result Date: 10/16/2020 Successful intravascular lithotripsy, PTCA and stenting     7.0X39 mm balloon expandable Viabahn VBX stent Rt common iliac artery     7.0X60 mm self expanding Absolute Pro stent Rt distal iliac artery No named vessels at ankle level in both lower extremities. Will consult vascular surgery for Rt fem-to-left profunda bypass given left foot critical limb ischemia. Nigel Mormon, MD Pager: [phone] Office: [phone]   DG CHEST PORT 1 VIEW  Result Date: 11/11/2020 CLINICAL DATA:  Fever and chest pain. EXAM: PORTABLE CHEST 1 VIEW COMPARISON:  October 30, 2020 FINDINGS: Cardiomediastinal silhouette is normal. Mediastinal contours appear intact. Mild streaky airspace opacities in the lung bases. Osseous structures are without acute abnormality. Soft tissues are grossly normal. IMPRESSION: Mild streaky airspace opacities in the lung bases may represent atelectasis or atypical/viral pneumonia. Electronically  Signed   By: Fidela Salisbury M.D.   On: 11/11/2020 10:28   DG Chest Portable 1 View  Result Date: 10/30/2020 CLINICAL DATA:  Chest pain EXAM: PORTABLE CHEST 1 VIEW COMPARISON:  08/03/2020 FINDINGS: Single frontal view of the chest demonstrates stenting artifact from external cardiac device. Cardiac silhouette is unremarkable. No airspace disease, effusion, or pneumothorax. No acute bony abnormalities. IMPRESSION: 1. No acute intrathoracic process. Electronically Signed   By: Randa Ngo M.D.   On: 10/30/2020 17:01   VAS US CAROTID  Result Date: 10/17/2020 Carotid Arterial Duplex Study Indications:  Pre-surgical evaluation. Risk Factors: Hypertension, hyperlipidemia, current smoker, coronary artery               disease, PAD. Performing Technologist: Maudry Mayhew MHA, RDMS, RVT, RDCS  Examination Guidelines: A complete evaluation includes B-mode imaging, spectral Doppler, color Doppler, and power Doppler as needed of all accessible portions of each vessel. Bilateral testing is considered an integral part of a complete examination. Limited examinations for reoccurring indications may be performed as noted.  Right Carotid Findings: +----------+--------+--------+--------+-----------------------+--------+           PSV cm/sEDV cm/sStenosisPlaque Description     Comments +----------+--------+--------+--------+-----------------------+--------+ CCA Prox  186                     smooth and heterogenous         +----------+--------+--------+--------+-----------------------+--------+ CCA Distal87      16              smooth and heterogenous         +----------+--------+--------+--------+-----------------------+--------+ ICA Prox  56      13              smooth and heterogenous         +----------+--------+--------+--------+-----------------------+--------+ ICA Distal70      15                                              +----------+--------+--------+--------+-----------------------+--------+  ECA       139     7               smooth and heterogenous         +----------+--------+--------+--------+-----------------------+--------+ +----------+--------+-------+----------------+-------------------+           PSV cm/sEDV cmsDescribe        Arm Pressure (mmHG) +----------+--------+-------+----------------+-------------------+ [id number]            Multiphasic, WNL                    +----------+--------+-------+----------------+-------------------+ +---------+--------+--+--------+--+---------+ VertebralPSV cm/s78EDV cm/s29Antegrade +---------+--------+--+--------+--+---------+  Left Carotid Findings: +----------+--------+--------+--------+-----------------------+--------+           PSV cm/sEDV cm/sStenosisPlaque Description     Comments +----------+--------+--------+--------+-----------------------+--------+ CCA Prox  207     10                                              +----------+--------+--------+--------+-----------------------+--------+ CCA [id number]     14              smooth and heterogenous         +----------+--------+--------+--------+-----------------------+--------+ ICA Prox  81      17  smooth and heterogenous         +----------+--------+--------+--------+-----------------------+--------+ ICA Distal109     29                                              +----------+--------+--------+--------+-----------------------+--------+ ECA       240     14              smooth and heterogenous         +----------+--------+--------+--------+-----------------------+--------+ +----------+--------+--------+--------+-------------------+           PSV cm/sEDV cm/sDescribeArm Pressure (mmHG) +----------+--------+--------+--------+-------------------+ Subclavian310                                         +----------+--------+--------+--------+-------------------+  +---------+--------+---+--------+--+---------------------------------+ VertebralPSV cm/s293EDV cm/s47>50% cervical stenosis, antegrade +---------+--------+---+--------+--+---------------------------------+   Summary: Right Carotid: Velocities in the right ICA are consistent with a 1-39% stenosis. Left Carotid: Velocities in the left ICA are consistent with a 1-39% stenosis. Vertebrals:  Bilateral vertebral arteries demonstrate antegrade flow. Left >50%              cervical stenosis. Subclavians: Normal flow hemodynamics were seen in bilateral subclavian              arteries. *See table(s) above for measurements and observations.  Electronically signed by Harold Barban MD on 10/17/2020 at 7:38:49 PM.    Final      Time Spent in minutes  30     Desiree Hane M.D on 11/11/2020 at 11:41 AM  To page go to [email or web address] - password [facility]

## 2020-11-12 ENCOUNTER — Inpatient Hospital Stay (HOSPITAL_COMMUNITY): Payer: BC Managed Care – PPO

## 2020-11-12 DIAGNOSIS — L089 Local infection of the skin and subcutaneous tissue, unspecified: Secondary | ICD-10-CM | POA: Diagnosis not present

## 2020-11-12 DIAGNOSIS — S31809A Unspecified open wound of unspecified buttock, initial encounter: Secondary | ICD-10-CM | POA: Diagnosis not present

## 2020-11-12 DIAGNOSIS — L8990 Pressure ulcer of unspecified site, unspecified stage: Secondary | ICD-10-CM | POA: Insufficient documentation

## 2020-11-12 DIAGNOSIS — R1084 Generalized abdominal pain: Secondary | ICD-10-CM | POA: Diagnosis not present

## 2020-11-12 DIAGNOSIS — R509 Fever, unspecified: Secondary | ICD-10-CM | POA: Diagnosis not present

## 2020-11-12 LAB — GLUCOSE, CAPILLARY
Glucose-Capillary: 105 mg/dL — ABNORMAL HIGH (ref 70–99)
Glucose-Capillary: 106 mg/dL — ABNORMAL HIGH (ref 70–99)
Glucose-Capillary: 110 mg/dL — ABNORMAL HIGH (ref 70–99)
Glucose-Capillary: 123 mg/dL — ABNORMAL HIGH (ref 70–99)
Glucose-Capillary: 96 mg/dL (ref 70–99)
Glucose-Capillary: 98 mg/dL (ref 70–99)

## 2020-11-12 LAB — CBC WITH DIFFERENTIAL/PLATELET
Abs Immature Granulocytes: 0.08 10*3/uL — ABNORMAL HIGH (ref 0.00–0.07)
Basophils Absolute: 0 10*3/uL (ref 0.0–0.1)
Basophils Relative: 0 %
Eosinophils Absolute: 0.4 10*3/uL (ref 0.0–0.5)
Eosinophils Relative: 3 %
HCT: 23.8 % — ABNORMAL LOW (ref 39.0–52.0)
Hemoglobin: 7.5 g/dL — ABNORMAL LOW (ref 13.0–17.0)
Immature Granulocytes: 1 %
Lymphocytes Relative: 7 %
Lymphs Abs: 0.8 10*3/uL (ref 0.7–4.0)
MCH: 28.5 pg (ref 26.0–34.0)
MCHC: 31.5 g/dL (ref 30.0–36.0)
MCV: 90.5 fL (ref 80.0–100.0)
Monocytes Absolute: 0.8 10*3/uL (ref 0.1–1.0)
Monocytes Relative: 7 %
Neutro Abs: 9.4 10*3/uL — ABNORMAL HIGH (ref 1.7–7.7)
Neutrophils Relative %: 82 %
Platelets: 220 10*3/uL (ref 150–400)
RBC: 2.63 MIL/uL — ABNORMAL LOW (ref 4.22–5.81)
RDW: 18.2 % — ABNORMAL HIGH (ref 11.5–15.5)
WBC: 11.5 10*3/uL — ABNORMAL HIGH (ref 4.0–10.5)
nRBC: 0 % (ref 0.0–0.2)

## 2020-11-12 LAB — HEPATIC FUNCTION PANEL
ALT: 72 U/L — ABNORMAL HIGH (ref 0–44)
AST: 55 U/L — ABNORMAL HIGH (ref 15–41)
Albumin: 2 g/dL — ABNORMAL LOW (ref 3.5–5.0)
Alkaline Phosphatase: 51 U/L (ref 38–126)
Bilirubin, Direct: 0.1 mg/dL (ref 0.0–0.2)
Indirect Bilirubin: 0.5 mg/dL (ref 0.3–0.9)
Total Bilirubin: 0.6 mg/dL (ref 0.3–1.2)
Total Protein: 5.7 g/dL — ABNORMAL LOW (ref 6.5–8.1)

## 2020-11-12 LAB — BASIC METABOLIC PANEL
Anion gap: 9 (ref 5–15)
BUN: 22 mg/dL (ref 8–23)
CO2: 23 mmol/L (ref 22–32)
Calcium: 8.3 mg/dL — ABNORMAL LOW (ref 8.9–10.3)
Chloride: 107 mmol/L (ref 98–111)
Creatinine, Ser: 1.13 mg/dL (ref 0.61–1.24)
GFR, Estimated: >60 mL/min (ref >60)
Glucose, Bld: 111 mg/dL — ABNORMAL HIGH (ref 70–99)
Potassium: 3.8 mmol/L (ref 3.5–5.1)
Sodium: 139 mmol/L (ref 135–145)

## 2020-11-12 LAB — URINE CULTURE: Culture: NO GROWTH

## 2020-11-12 LAB — PROCALCITONIN: Procalcitonin: 0.1 ng/mL

## 2020-11-12 LAB — SARS CORONAVIRUS 2 BY RT PCR (HOSPITAL ORDER, PERFORMED IN ~~LOC~~ HOSPITAL LAB): SARS Coronavirus 2: NEGATIVE

## 2020-11-12 MED ORDER — COLLAGENASE 250 UNIT/GM EX OINT
TOPICAL_OINTMENT | Freq: Every day | CUTANEOUS | Status: DC
  Filled 2020-11-12: qty 30

## 2020-11-12 MED ORDER — VANCOMYCIN HCL 750 MG/150ML IV SOLN
750.0000 mg | Freq: Two times a day (BID) | INTRAVENOUS | Status: DC
  Administered 2020-11-13 (×2): 750 mg via INTRAVENOUS
  Filled 2020-11-12 (×2): qty 150

## 2020-11-12 MED ORDER — VANCOMYCIN HCL 1250 MG/250ML IV SOLN
1250.0000 mg | Freq: Once | INTRAVENOUS | Status: AC
  Administered 2020-11-12: 1250 mg via INTRAVENOUS
  Filled 2020-11-12: qty 250

## 2020-11-12 MED ORDER — SODIUM CHLORIDE 0.9 % IV SOLN
2.0000 g | Freq: Three times a day (TID) | INTRAVENOUS | Status: DC
  Administered 2020-11-12 – 2020-11-14 (×7): 2 g via INTRAVENOUS
  Filled 2020-11-12 (×7): qty 2

## 2020-11-12 NOTE — Progress Notes (Signed)
Pharmacy Antibiotic Note  Mike [name] is a 59 y.o. male admitted on 10/30/2020 with leg pain and chest pain. Hx severe PVD, STEMI and PCI in August 2021. Found to have critical limb ischemia, s/p L AKA on 11/9 .  Pharmacy has been consulted for Cefepime and Vancmycin dosing for sacral wound infection and pneumonia.      Tmax 100.6, WBC 11.5. Blood and urine cultures sent 11/14.   Creatinine up to 4.62 on admit 11/2, but back to normal range on 11/12.  Plan:  Cefepime 2gm IV q8h.  Vancomycin 1250 mg IV x 1 then 750 mg IV q12h.   Vanc trough goal 15-20 mcg/ml  Follow renal function, culutre data, clinical progress and antibiotic plans.  Vanc trough level as indicated.  Height: 6' (182.9 cm) Weight: 64.1 kg (141 lb 5 oz) IBW/kg (Calculated) : 77.6  Temp (24hrs), Avg:99.3 F (37.4 C), Min:98 F (36.7 C), Max:100.6 F (38.1 C)  Recent Labs  Lab 11/07/20 0349 11/07/20 0349 11/08/20 0213 11/09/20 0343 11/10/20 0449 11/11/20 0141 11/12/20 0601  WBC 19.7*   < > 14.1* 12.9* 10.0 12.3* 11.5*  CREATININE 1.34*  --  1.29* 1.12  --  1.06 1.13   < > = values in this interval not displayed.    Estimated Creatinine Clearance: 62.2 mL/min (by C-G formula based on SCr of 1.13 mg/dL).    Allergies  Allergen Reactions  . Chlorhexidine     Antimicrobials this admission:  Vancomycin 11/15 >>  Cefepime 11/15 >>  Cefazolin 2gm IV x 1 pre-op on 11/9  Dose adjustments this admission:  n/a  Microbiology results:  11/14 blood x 2:   11/9 MSRA PCR: negative  11/3 blood x 2: negative  11/2 COVID and flu: negative  11/15 COVID and flu:  Thank you for allowing pharmacy to be a part of this patient's care.  Arty Baumgartner, [place] Phone: [phone] 11/12/2020 10:43 AM

## 2020-11-12 NOTE — TOC Progression Note (Signed)
Transition of Care [facility]) - Progression Note    Patient Details  Name: Mike [name] MRN: [id number] Date of Birth: September 15, 1961  Transition of [facility]) CM/SW [place], [place] Phone Number: 11/12/2020, 1:11 PM  Clinical Narrative:    Per MD, pt is not quite ready to dc. CSW notified SNF who requested to be kept posted of any changes.    Expected Discharge Plan: [place] Barriers to Discharge: Continued Medical Work up  Expected Discharge Plan and Services Expected Discharge Plan: [place] arrangements for the past 2 months: Single Family Home                                       Social Determinants of Health (SDOH) Interventions    Readmission Risk Interventions No flowsheet data found.

## 2020-11-12 NOTE — Progress Notes (Signed)
  Progress Note    11/12/2020 8:46 AM 6 Days Post-Op  Subjective:  No pain L AKA incision   Vitals:   11/12/20 0405 11/12/20 0736  BP: (!) 127/52 128/63  Pulse: 66 65  Resp: 16 15  Temp: 98 F (36.7 C) 99.1 F (37.3 C)  SpO2: 96% 95%    Physical Exam: Incisions:  AKA incision with viable skin edges, no bleeding or signs of infection   CBC    Component Value Date/Time   WBC 11.5 (H) 11/12/2020 0601   RBC 2.63 (L) 11/12/2020 0601   HGB 7.5 (L) 11/12/2020 0601   HCT 23.8 (L) 11/12/2020 0601   PLT 220 11/12/2020 0601   MCV 90.5 11/12/2020 0601   MCV 85.0 03/16/2016 1642   MCH 28.5 11/12/2020 0601   MCHC 31.5 11/12/2020 0601   RDW 18.2 (H) 11/12/2020 0601   LYMPHSABS 0.8 11/12/2020 0601   MONOABS 0.8 11/12/2020 0601   EOSABS 0.4 11/12/2020 0601   BASOSABS 0.0 11/12/2020 0601    BMET    Component Value Date/Time   NA 139 11/12/2020 0601   NA 135 10/26/2020 1004   K 3.8 11/12/2020 0601   CL 107 11/12/2020 0601   CO2 23 11/12/2020 0601   GLUCOSE 111 (H) 11/12/2020 0601   BUN 22 11/12/2020 0601   BUN 79 (HH) 10/26/2020 1004   CREATININE 1.13 11/12/2020 0601   CALCIUM 8.3 (L) 11/12/2020 0601   GFRNONAA >60 11/12/2020 0601   GFRAA 12 (L) 10/26/2020 1004    INR    Component Value Date/Time   INR 1.2 10/30/2020 1822     Intake/Output Summary (Last 24 hours) at 11/12/2020 0846 Last data filed at 11/12/2020 0843 Gross per 24 hour  Intake 1080 ml  Output 875 ml  Net 205 ml     Assessment/Plan:  59 y.o. male is s/p left above knee amputation  6 Days Post-Op  - L AKA incision healing well - Staples out 4-6 weeks from surgery - Ok for d/c from vascular standpoint; will follow intermittently   Dagoberto Ligas, PA-C [facility] [phone] 11/12/2020 8:46 AM

## 2020-11-12 NOTE — Progress Notes (Signed)
Pt refused foley and chg care for day shift  Will try again

## 2020-11-12 NOTE — Progress Notes (Signed)
Pause 11.07 seconds. Asymptomatic, VS stable. MD and cardiology notified. No IV on assessment, will need one.

## 2020-11-12 NOTE — Progress Notes (Addendum)
[facility]  PROGRESS NOTE  PRATHER FAILLA WUJ:[id number] DOB: [birth year] DOA: 10/30/2020 PCP: Patient, No Pcp Per Admit date - 10/30/2020   Admitting Physician Eben Burow, MD  Outpatient Primary MD for the patient is Patient, No Pcp Per  LOS - 6 Brief Narrative   [name] is a 59 year old male with medical history significant for STEMI (9/56/2130) complicated by cardiac arrest secondary to sides post PCI with chronic ST elevations in inferior leads since, severe bilateral renal artery stenosis, PAD with history of critical limb ischemia requiring right iliac revascularization of left lower extremity, chronic systolic CHF related to ischemic cardiomyopathy who presented on 11/2 with complaints of shortness of breath decrease ability to walk due to severe leg pain found to have critical limbischemia of left leg.   Patient has had ongoing issues with atherosclerotic vascular disease of the left leg but given his comorbidities (recent STEMI still requiring aspirin and Brilinta, CKD, depressed EF patient was too high risk for surgical revascularization per vascular surgery, but due to current hospitalization and further decompensation of symptoms after surgery elected to do above-knee amputation to deviate his rest pain.  Patient was transitioned from aspirin Brilinta aspirin and Plavix and underwent left AKA by Dr. Donnetta Hutching 11/9.  Subjective  Complains of abdominal pain that improves with bowel movements.  Had fever overnight T-max of 100.6.  No shortness of breath, no dyspnea.   A & P   Fever, suspect soft skin tissue infection of sacral ulcer site and potential pneumonia.  Chest x-ray mentions possible opacity versus atelectasis, patient has no cough, no dyspnea, has downtrending leukocytosis.  Evaluation of decubitus ulcer of sacrum there are some open wounds and obvious to the ER with worsening pain on palpation and erythema this is likely source.  Repeat Covid PCR  negative -Monitor blood cultures -Started on vancomycin, cefepime given infection during hospital stay -Wound care consulted given sacral decubitus ulcer.    Sacral decubitus ulcer with wound in gluteal fold.  Stooling present.  Tender to palpation.  Obvious erythema.  No obvious drainage. -Wound care recommends applying Santyl to the gluteal fold and cover with saline moistened gauze and foam dressing, change daily -Continue antibiotics as mentioned above  Confusion, intermittent.  Disorientation overnight on 11/14.  Currently alert and oriented x4.  Could be hospital-acquired delirium.  Also concern this could be related to possible infection as mentioned above. Ammonia unremarkable, glucose unremarkable -Continue to closely monitor mental status -Delirium precautions  Asymptomatic Sinus Pauses. Noted on telemetry in setting of coreg, again occurred while on Cordarone.  Suspect related to possible OSA -monitor on telemetry -coreg discontinued -Appreciate cardiology recommendations, to discontinue Corlanor as well -goal K> 4, Mg> 2, replete as necessary  Abdominal pain.  Suspect constipation related to opioid pain management.  Is having BMs but still complains of abdominal pain. -Check LFTs on abdominal x-ray to ensure no partial small bowel obstruction -Continue monitor output  Severe PAD with gangrene of left leg with no revascularization options, status post left AKA due to gangrene and intolerable pain/rest pain below/9.  Pain is much better controlled.  OFF PCA pump. Stump healing well  -Oxycontin discontinued -IV fentanyl to 50 mcg per severe breakthrough pain as needed every 2 hours -Oral oxycodone 5 mg IR, every 4 hours as needed moderate pain, scheduled gabapentin --vascular outpatient follow up w/Dr. Early in 4 weeks (his office will call), maintain staples for 4 weeks.   AKI on CKD stage III, improved.  Occurred  in setting of contrast received after peripheral arteriogram.   Creatinine back at baseline -Avoid nephrotoxins  Chronic systolic failure in setting of ischemic cardiomyopathy (recent STEMI in August 2020) history of), euvolemic on exam -Coreg discontinued given sinus pause overnight -D/c entresto due to low BP -Monitor renal function, avoid dehydration/hypotension -Patient is DNR, cardiology does not recommend removing LifeVest or ICD given risk for infection -Discontinued Corlanor given pauses -Continue aspirin and Plavix -- crestor 10 mg  AKI on CKD stage III, resolved.  Prior to this hospitalization previous creatinine baseline of 1.06.  Peak creatinine this admission of 6.34, has continued to downtrend, now 1.34.  Suspect was prerenal etiology in the setting of NSAIDs and contrast from arteriogram this hospital stay -Avoid nephrotoxins -Monitor BMP  Chronic hypotension,stable.  SBP stable ranging 90s-low 100s.   -entresto discontinued due to low BP --coreg discontinued due to sinus pauses  Mild anion gap metabolic acidosis likely in the setting of CKD now resolved. nadir of 11 -Continue sodium bicarb  Type 2 diabetes, A1c 7.1. glucose at goal -Sliding scale insulin as needed, monitor CBGs  Acute urinary retention. Suspect related to recent surgery and decreased mobility. UA unremarkable. In and out caht x2 for urine retention x 500 cc in 24 hours. Foley placed on 11/12 -continue foley and due voiding trial likely on discharge -if persists will need urology follow up for outpatient evaluation.  Normocytic anemia. Acutely worsened from postoperative anemia. Hgb has been stable at 7 since admission. No current signs or symptoms of bleeding. -on aspirin and plavix -monitor cbc, transfuse if hgb less than 7 -daily cbc    Family Communication  : None at bedside, sister updated at bedside on 11/12  Code Status : DNR  Disposition Plan  :  Patient is from home. Anticipated d/c date: 1 to 2 days likely to SNF versus CIR. Barriers to d/c or  necessity for inpatient status: Currently requiring IV antibiotics given soft skin tissue infection of sacral/gluteal wound Consults  : Nephrology, vascular surgery, cardiology  Procedures  :    DVT Prophylaxis  : Heparin MDM: The below labs and imaging reports were reviewed and summarized above.  Medication management as above.  Lab Results  Component Value Date   PLT 220 11/12/2020    Diet :  Diet Order            Diet Heart Room service appropriate? Yes; Fluid consistency: Thin  Diet effective now                  Inpatient Medications Scheduled Meds: . aspirin EC  81 mg Oral Daily  . clopidogrel  75 mg Oral Daily  . collagenase   Topical Daily  . feeding supplement (GLUCERNA SHAKE)  237 mL Oral QID  . gabapentin  300 mg Oral BID  . Gerhardt's butt cream   Topical BID  . insulin aspart  0-6 Units Subcutaneous Q4H  . multivitamin with minerals  1 tablet Oral Daily  . nicotine  7 mg Transdermal Q24H  . polyethylene glycol  17 g Oral BID  . rosuvastatin  10 mg Oral Daily  . senna-docusate  2 tablet Oral BID  . sodium bicarbonate  1,300 mg Oral BID   Continuous Infusions: . sodium chloride 250 mL (11/12/20 1221)  . ceFEPime (MAXIPIME) IV 2 g (11/12/20 1222)  . magnesium sulfate bolus IVPB    . [START ON 11/13/2020] vancomycin     PRN Meds:.sodium chloride, acetaminophen **OR** acetaminophen, nitroGLYCERIN, oxyCODONE  Antibiotics  :   Anti-infectives (From admission, onward)   Start     Dose/Rate Route Frequency Ordered Stop   11/13/20 0200  vancomycin (VANCOREADY) IVPB 750 mg/150 mL        750 mg 150 mL/hr over 60 Minutes Intravenous Every 12 hours 11/12/20 1042     11/12/20 1130  ceFEPIme (MAXIPIME) 2 g in sodium chloride 0.9 % 100 mL IVPB        2 g 200 mL/hr over 30 Minutes Intravenous Every 8 hours 11/12/20 1042     11/12/20 1130  vancomycin (VANCOREADY) IVPB 1250 mg/250 mL        1,250 mg 166.7 mL/hr over 90 Minutes Intravenous  Once 11/12/20 1042  11/12/20 1457   11/06/20 0930  ceFAZolin (ANCEF) IVPB 2g/100 mL premix  Status:  Discontinued        2 g 200 mL/hr over 30 Minutes Intravenous To Surgery 11/06/20 0922 11/06/20 1340       Objective   Vitals:   11/12/20 0001 11/12/20 0405 11/12/20 0736 11/12/20 1123  BP:  (!) 127/52 128/63 129/65  Pulse:  66 65 81  Resp:  16 15 (!) 21  Temp:  98 F (36.7 C) 99.1 F (37.3 C) 97.9 F (36.6 C)  TempSrc:  Oral Oral Oral  SpO2:  96% 95% 99%  Weight: 64.1 kg     Height:        SpO2: 99 % O2 Flow Rate (L/min): 2 L/min  Wt Readings from Last 3 Encounters:  11/12/20 64.1 kg  10/26/20 69.7 kg  10/22/20 69.9 kg     Intake/Output Summary (Last 24 hours) at 11/12/2020 1728 Last data filed at 11/12/2020 1224 Gross per 24 hour  Intake 460 ml  Output 1075 ml  Net -615 ml    Physical Exam:     alert and oriented x4, normal affect No new F.N deficits,  [place].AT, Normal respiratory effort on room air, CTAB Abdomen soft, tender with deep palpation in all quadrants, no rebound tenderness or guarding RRR,No Gallops,Rubs or new Murmurs, Left AKA with dressing in place, staples in place, wound healing well with no erythema or tenderness Erythema of sacrum extending to gluteal fold with ulcerations but no obvious drainage and stool present      +ve B.Sounds, Abd Soft, No tenderness, No rebound, guarding or rigidity. No Cyanosis, No new Rash or bruise     I have personally reviewed the following:   Data Reviewed:  CBC Recent Labs  Lab 11/08/20 0213 11/09/20 0343 11/10/20 0449 11/11/20 0141 11/12/20 0601  WBC 14.1* 12.9* 10.0 12.3* 11.5*  HGB 7.4* 7.2* 7.0* 7.8* 7.5*  HCT 22.9* 22.4* 22.3* 25.1* 23.8*  PLT 197 205 191 226 220  MCV 88.4 90.0 88.8 89.3 90.5  MCH 28.6 28.9 27.9 27.8 28.5  MCHC 32.3 32.1 31.4 31.1 31.5  RDW 18.1* 18.6* 18.5* 18.4* 18.2*  LYMPHSABS 1.3 1.5 1.2 0.7 0.8  MONOABS 1.0 1.0 0.7 0.8 0.8  EOSABS 0.3 0.4 0.4 0.3 0.4  BASOSABS 0.0 0.0 0.0  0.0 0.0    Chemistries  Recent Labs  Lab 11/07/20 0349 11/07/20 0349 11/08/20 0213 11/09/20 0343 11/10/20 0544 11/11/20 0141 11/12/20 0601  NA 139  --  140 137  --  137 139  K 3.7   < > 4.2 3.7 4.0 3.4* 3.8  CL 109  --  108 105  --  104 107  CO2 22  --  26 25  --  24 23  GLUCOSE 161*  --  114* 98  --  128* 111*  BUN 58*  --  49* 37*  --  24* 22  CREATININE 1.34*  --  1.29* 1.12  --  1.06 1.13  CALCIUM 9.0  --  8.7* 8.2*  --  8.3* 8.3*  MG  --   --   --   --  1.8 1.7  --    < > = values in this interval not displayed.   ------------------------------------------------------------------------------------------------------------------ No results for input(s): CHOL, HDL, LDLCALC, TRIG, CHOLHDL, LDLDIRECT in the last 72 hours.  Lab Results  Component Value Date   HGBA1C 7.1 (H) 10/31/2020   ------------------------------------------------------------------------------------------------------------------ Recent Labs    11/10/20 0544  TSH 4.755*   ------------------------------------------------------------------------------------------------------------------ No results for input(s): VITAMINB12, FOLATE, FERRITIN, TIBC, IRON, RETICCTPCT in the last 72 hours.  Coagulation profile No results for input(s): INR, PROTIME in the last 168 hours.  No results for input(s): DDIMER in the last 72 hours.  Cardiac Enzymes No results for input(s): CKMB, TROPONINI, MYOGLOBIN in the last 168 hours.  Invalid input(s): CK ------------------------------------------------------------------------------------------------------------------    Component Value Date/Time   BNP 488.4 (H) 10/30/2020 1820    Micro Results Recent Results (from the past 240 hour(s))  Surgical PCR screen     Status: None   Collection Time: 11/06/20  2:40 AM   Specimen: Nasal Mucosa; Nasal Swab  Result Value Ref Range Status   MRSA, PCR NEGATIVE NEGATIVE Final   Staphylococcus aureus NEGATIVE NEGATIVE Final     Comment: (NOTE) The Xpert SA Assay (FDA approved for NASAL specimens in patients 98 years of age and older), is one component of a comprehensive surveillance program. It is not intended to diagnose infection nor to guide or monitor treatment. Performed at [facility], [place] [address], [place], [place] [address]   Culture, blood (routine x 2)     Status: None (Preliminary result)   Collection Time: 11/11/20  8:23 AM   Specimen: BLOOD  Result Value Ref Range Status   Specimen Description BLOOD RIGHT ANTECUBITAL  Final   Special Requests   Final    BOTTLES DRAWN AEROBIC AND ANAEROBIC Blood Culture results may not be optimal due to an inadequate volume of blood received in culture bottles   Culture   Final    NO GROWTH 1 DAY Performed at [facility], [place] [address], [place], [place] [address]    Report Status PENDING  Incomplete  Culture, blood (routine x 2)     Status: None (Preliminary result)   Collection Time: 11/11/20  8:28 AM   Specimen: BLOOD  Result Value Ref Range Status   Specimen Description BLOOD LEFT ANTECUBITAL  Final   Special Requests   Final    BOTTLES DRAWN AEROBIC ONLY Blood Culture results may not be optimal due to an inadequate volume of blood received in culture bottles   Culture   Final    NO GROWTH 1 DAY Performed at [facility], [place] [address], [place], [place] [address]    Report Status PENDING  Incomplete  Culture, Urine     Status: None   Collection Time: 11/11/20  8:35 AM   Specimen: Urine, Catheterized  Result Value Ref Range Status   Specimen Description URINE, CATHETERIZED  Final   Special Requests NONE  Final   Culture   Final    NO GROWTH Performed at [facility], [address] [place], [place] [address]  Report Status 11/12/2020 FINAL  Final  SARS Coronavirus 2 by RT PCR (hospital order, performed in [facility] hospital lab) Nasopharyngeal Nasopharyngeal Swab     Status: None   Collection Time: 11/12/20 10:19  AM   Specimen: Nasopharyngeal Swab  Result Value Ref Range Status   SARS Coronavirus 2 NEGATIVE NEGATIVE Final    Comment: (NOTE) SARS-CoV-2 target nucleic acids are NOT DETECTED.  The SARS-CoV-2 RNA is generally detectable in upper and lower respiratory specimens during the acute phase of infection. The lowest concentration of SARS-CoV-2 viral copies this assay can detect is 250 copies / mL. A negative result does not preclude SARS-CoV-2 infection and should not be used as the sole basis for treatment or other patient management decisions.  A negative result may occur with improper specimen collection / handling, submission of specimen other than nasopharyngeal swab, presence of viral mutation(s) within the areas targeted by this assay, and inadequate number of viral copies (<250 copies / mL). A negative result must be combined with clinical observations, patient history, and epidemiological information.  Fact Sheet for Patients:   StrictlyIdeas.no  Fact Sheet for Healthcare Providers: BankingDealers.co.za  This test is not yet approved or  cleared by the [place] FDA and has been authorized for detection and/or diagnosis of SARS-CoV-2 by FDA under an Emergency Use Authorization (EUA).  This EUA will remain in effect (meaning this test can be used) for the duration of the COVID-19 declaration under Section 564(b)(1) of the Act, 21 U.S.C. section 360bbb-3(b)(1), unless the authorization is terminated or revoked sooner.  Performed at [facility], [place] [address], [place], [place] [address]     Radiology Reports US RENAL  Result Date: 11/08/2020 CLINICAL DATA:  Urinary retention EXAM: RENAL / URINARY TRACT ULTRASOUND COMPLETE COMPARISON:  10/17/2020 FINDINGS: Right Kidney: Renal measurements: 11.8 x 5.4 x 5.8 cm = volume: 194 mL. Echogenicity within normal limits. No mass or hydronephrosis visualized. Left Kidney: Renal  measurements: 10.4 x 4.7 x 4.6 cm = volume: 119 mL. Echogenicity within normal limits. Incidental simple cyst measuring 1.5 cm. No mass or hydronephrosis visualized. Bladder: Prevoid urinary bladder volume 635 mL. Patient is unable to void at the time of examination. Other: None. IMPRESSION: 1. No hydronephrosis. 2. Prevoid urinary bladder volume 635 mL. Patient is unable to void at the time of examination. Nursing staff notified by sonographer. Electronically Signed   By: Eddie Candle M.D.   On: 11/08/2020 18:29   US RENAL  Result Date: 10/17/2020 CLINICAL DATA:  Acute kidney injury EXAM: RENAL / URINARY TRACT ULTRASOUND COMPLETE COMPARISON:  None. FINDINGS: Right Kidney: Renal measurements: 11.8 x 5.6 x 5.7 cm = volume: 197.9 mL. Echogenicity and renal cortical thickness are within normal limits. No mass, perinephric fluid, or hydronephrosis visualized. No sonographically demonstrable calculus or ureterectasis. Left Kidney: Renal measurements: 10.7 x 4.7 x 4.1 cm = volume: 109.5 mL. Echogenicity and renal cortical thickness are within normal limits. No perinephric fluid or hydronephrosis visualized. There is a cyst arising from the lower pole left kidney measuring 1.8 x 1.4 x 1.5 cm. No sonographically demonstrable calculus or ureterectasis. Bladder: Appears normal for degree of bladder distention. Other: None. IMPRESSION: Cyst arising from lower pole left kidney measuring 1.8 x 1.4 x 1.5 cm. Study otherwise unremarkable. Electronically Signed   By: Lowella Grip III M.D.   On: 10/17/2020 15:28   PERIPHERAL VASCULAR CATHETERIZATION  Result Date: 10/16/2020 Successful intravascular lithotripsy, PTCA and stenting     7.0X39 mm  balloon expandable Viabahn VBX stent Rt common iliac artery     7.0X60 mm self expanding Absolute Pro stent Rt distal iliac artery No named vessels at ankle level in both lower extremities. Will consult vascular surgery for Rt fem-to-left profunda bypass given left foot critical  limb ischemia. Nigel Mormon, MD Pager: [phone] Office: [phone]   DG CHEST PORT 1 VIEW  Result Date: 11/11/2020 CLINICAL DATA:  Fever and chest pain. EXAM: PORTABLE CHEST 1 VIEW COMPARISON:  October 30, 2020 FINDINGS: Cardiomediastinal silhouette is normal. Mediastinal contours appear intact. Mild streaky airspace opacities in the lung bases. Osseous structures are without acute abnormality. Soft tissues are grossly normal. IMPRESSION: Mild streaky airspace opacities in the lung bases may represent atelectasis or atypical/viral pneumonia. Electronically Signed   By: Fidela Salisbury M.D.   On: 11/11/2020 10:28   DG Chest Portable 1 View  Result Date: 10/30/2020 CLINICAL DATA:  Chest pain EXAM: PORTABLE CHEST 1 VIEW COMPARISON:  08/03/2020 FINDINGS: Single frontal view of the chest demonstrates stenting artifact from external cardiac device. Cardiac silhouette is unremarkable. No airspace disease, effusion, or pneumothorax. No acute bony abnormalities. IMPRESSION: 1. No acute intrathoracic process. Electronically Signed   By: Randa Ngo M.D.   On: 10/30/2020 17:01   VAS US CAROTID  Result Date: 10/17/2020 Carotid Arterial Duplex Study Indications:  Pre-surgical evaluation. Risk Factors: Hypertension, hyperlipidemia, current smoker, coronary artery               disease, PAD. Performing Technologist: Maudry Mayhew MHA, RDMS, RVT, RDCS  Examination Guidelines: A complete evaluation includes B-mode imaging, spectral Doppler, color Doppler, and power Doppler as needed of all accessible portions of each vessel. Bilateral testing is considered an integral part of a complete examination. Limited examinations for reoccurring indications may be performed as noted.  Right Carotid Findings: +----------+--------+--------+--------+-----------------------+--------+           PSV cm/sEDV cm/sStenosisPlaque Description     Comments  +----------+--------+--------+--------+-----------------------+--------+ CCA Prox  186                     smooth and heterogenous         +----------+--------+--------+--------+-----------------------+--------+ CCA Distal87      16              smooth and heterogenous         +----------+--------+--------+--------+-----------------------+--------+ ICA Prox  56      13              smooth and heterogenous         +----------+--------+--------+--------+-----------------------+--------+ ICA Distal70      15                                              +----------+--------+--------+--------+-----------------------+--------+ ECA       139     7               smooth and heterogenous         +----------+--------+--------+--------+-----------------------+--------+ +----------+--------+-------+----------------+-------------------+           PSV cm/sEDV cmsDescribe        Arm Pressure (mmHG) +----------+--------+-------+----------------+-------------------+ [id number]            Multiphasic, WNL                    +----------+--------+-------+----------------+-------------------+ +---------+--------+--+--------+--+---------+ VertebralPSV cm/s78EDV cm/s29Antegrade +---------+--------+--+--------+--+---------+  Left Carotid  Findings: +----------+--------+--------+--------+-----------------------+--------+           PSV cm/sEDV cm/sStenosisPlaque Description     Comments +----------+--------+--------+--------+-----------------------+--------+ CCA Prox  207     10                                              +----------+--------+--------+--------+-----------------------+--------+ CCA Distal124     14              smooth and heterogenous         +----------+--------+--------+--------+-----------------------+--------+ ICA Prox  81      17              smooth and heterogenous          +----------+--------+--------+--------+-----------------------+--------+ ICA Distal109     29                                              +----------+--------+--------+--------+-----------------------+--------+ ECA       240     14              smooth and heterogenous         +----------+--------+--------+--------+-----------------------+--------+ +----------+--------+--------+--------+-------------------+           PSV cm/sEDV cm/sDescribeArm Pressure (mmHG) +----------+--------+--------+--------+-------------------+ Subclavian310                                         +----------+--------+--------+--------+-------------------+ +---------+--------+---+--------+--+---------------------------------+ VertebralPSV cm/s293EDV cm/s47>50% cervical stenosis, antegrade +---------+--------+---+--------+--+---------------------------------+   Summary: Right Carotid: Velocities in the right ICA are consistent with a 1-39% stenosis. Left Carotid: Velocities in the left ICA are consistent with a 1-39% stenosis. Vertebrals:  Bilateral vertebral arteries demonstrate antegrade flow. Left >50%              cervical stenosis. Subclavians: Normal flow hemodynamics were seen in bilateral subclavian              arteries. *See table(s) above for measurements and observations.  Electronically signed by Harold Barban MD on 10/17/2020 at 7:38:49 PM.    Final      Time Spent in minutes  30     Desiree Hane M.D on 11/12/2020 at 5:28 PM  To page go to [email or web address] - password [email or web address]

## 2020-11-12 NOTE — Consult Note (Signed)
[facility] Nurse Consult Note: Patient receiving care in [place]. Reason for Consult: "sacral wound, concern for ssti and soiled by stooling, starting antibiotics" Wound type: The wound is in the gluteal fold and likely started as MASD-IAD, now with yellow wound bed Pressure Injury POA: Yes/No/NA Measurement: 4 cm x 0.5 cm x unknown depth Wound bed: yellow Drainage (amount, consistency, odor) none Periwound: erythematous Dressing procedure/placement/frequency: Apply Santyl to gluteal fold wound in a nickel thick layer. Cover with a saline moistened gauze, then foam dressing.  Change daily. Monitor the wound area(s) for worsening of condition such as: Signs/symptoms of infection,  Increase in size,  Development of or worsening of odor, Development of pain, or increased pain at the affected locations.  Notify the medical team if any of these develop.  Thank you for the consult.  Discussed plan of care with the patient.  [facility] nurse will not follow at this time.  Please re-consult the [facility] team if needed.  Val Riles, RN, MSN, CWOCN, CNS-BC, pager [phone]

## 2020-11-12 NOTE — Progress Notes (Addendum)
Subjective:  S/p L AKA Complains of pain all over Denies cough Has had fever spikes, does not have subjective fever   T-max 101.70F on 11/11/2020 5:11 AM Asymptomatic sinus pauses, longest 11 seconds on 11/12/2020 at 7:26 AM   Objective:  Vital Signs in the last 24 hours: Temp:  [98 F (36.7 C)-100.6 F (38.1 C)] 99.1 F (37.3 C) (11/15 0736) Pulse Rate:  [62-72] 65 (11/15 0736) Resp:  [15-18] 15 (11/15 0736) BP: (116-128)/(46-63) 128/63 (11/15 0736) SpO2:  [94 %-98 %] 95 % (11/15 0736) Weight:  [64.1 kg] 64.1 kg (11/15 0001)  Intake/Output from previous day: 11/14 0701 - 11/15 0700 In: 17 [P.O.:960] Out: 875 [Urine:875]  Physical Exam Vitals and nursing note reviewed.  Constitutional:      General: He is not in acute distress. Neck:     Vascular: No JVD.  Cardiovascular:     Rate and Rhythm: Normal rate and regular rhythm.     Pulses:          Femoral pulses are 2+ on the right side and 0 on the left side.      Popliteal pulses are 1+ on the right side.       Dorsalis pedis pulses are 0 on the right side.       Posterior tibial pulses are 0 on the right side.     Heart sounds: Normal heart sounds. No murmur heard.   Pulmonary:     Effort: Pulmonary effort is normal.     Breath sounds: Normal breath sounds. No wheezing or rales.  Musculoskeletal:     Comments: Lt AKA  Skin:    Comments: Ecchymosis left flank       Lab Results: BMP Recent Labs    09/25/20 0903 10/17/20 0825 10/22/20 1054 10/22/20 1054 10/26/20 1004 10/30/20 1652 11/09/20 0343 11/09/20 0343 11/10/20 0544 11/11/20 0141 11/12/20 0601  NA 140   < > CANCELED   < > 135   < > 137  --   --  137 139  K 4.5   < > CANCELED   < > 3.9   < > 3.7   < > 4.0 3.4* 3.8  CL 108*   < > CANCELED   < > 98   < > 105  --   --  104 107  CO2 18*   < > CANCELED   < > 11*   < > 25  --   --  24 23  GLUCOSE 102*   < > 135*   < > 210*   < > 98  --   --  128* 111*  BUN 15   < > 67*   < > 79*   < > 37*  --   --   24* 22  CREATININE 1.16   < > 6.34*   < > 5.36*   < > 1.12  --   --  1.06 1.13  CALCIUM 9.4   < > CANCELED   < > 9.1   < > 8.2*  --   --  8.3* 8.3*  GFRNONAA 68   < > 9*   < > 11*   < > >60  --   --  >60 >60  GFRAA 78  --  10*  --  12*  --   --   --   --   --   --    < > = values in this interval not displayed.  CBC Recent Labs  Lab 11/12/20 0601  WBC 11.5*  RBC 2.63*  HGB 7.5*  HCT 23.8*  PLT 220  MCV 90.5  MCH 28.5  MCHC 31.5  RDW 18.2*  LYMPHSABS 0.8  MONOABS 0.8  EOSABS 0.4  BASOSABS 0.0    HEMOGLOBIN A1C Lab Results  Component Value Date   HGBA1C 7.1 (H) 10/31/2020   MPG 157.07 10/31/2020    Cardiac Panel (last 3 results) Recent Labs    10/17/20 1413  CKTOTAL 486*    BNP (last 3 results) Recent Labs    08/03/20 0644 10/22/20 1054 10/30/20 1820  BNP 751.4* 261.5* 488.4*    TSH Recent Labs    10/31/20 0429 11/10/20 0544  TSH 10.137* 4.755*    Lipid Panel     Component Value Date/Time   CHOL 95 (L) 09/11/2020 0806   TRIG 76 09/11/2020 0806   HDL 30 (L) 09/11/2020 0806   CHOLHDL 3.2 09/11/2020 0806   CHOLHDL 4.5 08/02/2020 0707   VLDL 15 08/02/2020 0707   LDLCALC 49 09/11/2020 0806     Hepatic Function Panel Recent Labs    08/02/20 0026 08/02/20 0026 09/25/20 0903 10/31/20 0429 11/04/20 1440 11/06/20 0413 11/07/20 0349 11/08/20 0213  PROT 7.5  --  7.0 7.2  --   --   --   --   ALBUMIN 4.2   < > 4.3 2.5*   < > 2.5* 2.2* 2.2*  AST 29  --  22 235*  --   --   --   --   ALT 22  --  25 123*  --   --   --   --   ALKPHOS 85  --  82 81  --   --   --   --   BILITOT 1.0  --  0.8 0.9  --   --   --   --   BILIDIR  --   --   --  0.2  --   --   --   --   IBILI  --   --   --  0.7  --   --   --   --    < > = values in this interval not displayed.   CARDIAC STUDIES:  EKG 10/30/2020: Sinus rhythm 73 bpm Nonspecific ST elevation inferior leads. Not STEMI  PV intervention 10/16/2020: Successful intravascular lithotripsy, PTCA and  stenting 7.0X39 mm balloon expandable Viabahn VBX stent Rt common iliac artery  7.0X60 mm self expanding Absolute Pro stent Rt distal iliac artery  No named vessels at ankle level in both lower extremities.  Will consult vascular surgery for Rt fem-to-left profunda bypass given left foot critical limb ischemia.  Abdominal Aortic Duplex 09/21/2020:  Moderate plaque noted in the proximal, mid and distal aorta. There is an  ulcerated plaque noted in the distal abdominal aorta. No AAA. Normal iliac  artery velocity.   Abdominal Aortic Duplex 09/21/2020:  Moderate plaque noted in the proximal, mid and distal aorta. There is an  ulcerated plaque noted in the distal abdominal aorta. No AAA. Normal iliac  artery velocity.  Lower Extremity Arterial Duplex 09/21/2020:  Theright SFA is occluded in the proximal segment with reconstitution at  the level of the popliteal artery with diffuse monophasic waveform below  the knee. Right profunda femoral artery has >50% stenosis. There is  moderate mixed plaque noted throughout the right lower extremity.  Monophasic waveform throughout the left lower extremity, indicates  significant proximal disease (  iliac artery).  Left SFA is occluded in the proximal segment and reconstitutes just above popliteal artery and diffuse dampened monophasic waveform throughout the lower extremity below the  knee. There is moderate mixed plaque throughout the left lower extremity.   This exam reveals severely decreased perfusion of the right lower  extremity, noted at the dorsalis pedis artery level (ABI 0.42) and  critically decreased perfusion of the left lower extremity, noted at the  dorsalis pedis and post tibial artery level (ABI 0.03).   Echocardiogram 09/11/2020:  Left ventricle cavity is moderately dilated. Moderate concentric  hypertrophy of the left ventricle. Moderate global and severe  inferolateral hypokinesis. LVEF 15-20%. Grade 1  diastolic dysfunction.  Normal left atrial pressure. Calculated EF 15%.  Mild to moderate mitral regurgitation.  Inadequate TR jet to estimate pulmonary artery systolic pressure. Normal  right atrial pressure.  Compared to previous study in 03/2020, LVEF is reduced from 30-35%. Mitral  regurgitation is marginally improved.   EKG 08/14/2020: Sinus rhythm 81 bpm Old inferior and anterior infarct  Coronary intervention 08/03/2020: LM: Distal 40% stenosis LAD:Mid 30% disease LCx: Subtotally occluded OM2, prox LCx 70% stenosis Successful percutaneous coronary intervention OM2-Prox LCx PTCA and overlapping stents placement  2.5 X 38 mm and 2.5 X 18 mm Resolute Onyx drug-eluting stents 100%--->0% stenosis. TIMI flow 0-->III Small caliber distal vessel with moderate diffuse disease RCA: Prox 30% stenosis. Patent mid RCA sttent 2.5 X 26 mm Resolute Onyx drug-eluting stent  LVEDP 42 mmHg  Echocardiogram 08/02/2020: 1. Moderate global and severe inferolateral hypokinesis. Left ventricular  ejection fraction, by estimation, is 30 to 35%. The left ventricle has  normal function. The left ventricle demonstrates regional wall motion  abnormalities (see scoring  diagram/findings for description). There is mild left ventricular  hypertrophy. Left ventricular diastolic parameters are consistent with  Grade II diastolic dysfunction (pseudonormalization).  2. Right ventricular systolic function is low normal. The right  ventricular size is normal.  3. Left atrial size was mildly dilated.  4. The mitral valve is grossly normal. Moderate mitral valve  regurgitation.  5. The aortic valve is tricuspid. Aortic valve regurgitation is not  visualized. Mild aortic valve sclerosis is present, with no evidence of  aortic valve stenosis.  6. The inferior vena cava is dilated in size with >50% respiratory  variability, suggesting right atrial pressure of 8  mmHg.   Coronary intervention 08/01/2020: LM: Distal 30% stenosis LAD:Mid 30% disease LCx: Subtotally occluded OM2, bridging left-to-left and right-to-left collaterals RCA: Prox 30% stenosis. Mid 100% occlusion Successful percutaneous coronary intervention mid RCA PTCA and stent placement 2.5 X 26 mm Resolute Onyx drug-eluting stent 100%--->0% stenosis. TIMI flow 0-->III  Assessment & Recommendations:  Mike [name] a59 y.o.Caucasian male with CAD, culprit (RCA) and nonculprit (LCx) PCI after STEMI 07/2020, HFrEF, moderate MR, h/o cardiac arrest 2/2 Torsades post PCI during index hospitalization, PAD with critical limb ischemia s/p Rt iliac revascularization, residual critical limb ischemia LLE with no immediate targets for revascularization, severe bilateral renal artery stenoses, acute kidney injury following peripheral arteriogram on10/19/2021.   Suspected sepsis: Fever spikes, possible pneumonia on chest x-ray.  He also has a pressure ulcer.  Need to exclude this as a source as well.  Defer antibiotic management to primary team.  Sinus pauses: Asymptomatic, predominantly during sleep hours.  Suspect this is secondary to obstructive sleep apnea. Discontinue carvedilol and Corlanor for now. Given his poor long-term prognosis, ongoing concern for systemic infection, he is not a candidate for pacemaker  at this time.  HFrEF: Euvolumic. Asymptomatic sinus pauses while on carvedilol 3.125 mg bid.  Stop for now.   Discontinue: Not as well.  Troponin elevation: Likely supply demand mismatch in the setting of acute systolic heart failure, acute kidney injury. Do not suspect acute coronary syndrome.   Acute kidney injury: Resolved  CAD: S/p STEMI 07/2020 Conitnue Aspirin/plavix Okay to resume Crestor at 10 mg, will up titrate outpatient.  Continue carvedilol 3/125 mg bid  PAD: Successful revascularization of right common iliac and right external  iliac artery. Now s/p Lt AKA, healing well Will check Rt ABI, s/p iliac artery stenting  Anemia: Possible post-op. No immediate indication for transfusion. Consider iron supplementation.  Goals of care : Patient reamains DNR< but is having second thoughts, now that his resting pain is improving. We mutually decided to not extent his LifeVest duration.  No plans for ICD at this time, given recent surgery and risk for pocket infection   Nigel Mormon, MD Pager: [phone] Office: [phone]

## 2020-11-12 NOTE — Progress Notes (Signed)
Physical Therapy Treatment Patient Details Name: Mike [name] MRN: [id number] DOB: [birth year] Today's Date: 11/12/2020    History of Present Illness 59 year old man admitted on 10/30/20 with SOB and inability to walk with severe L LE pain. Pt with critical L LE ischemia. Underwent L AKA 11/06/20. PMH: STEMI 07/2020 with moderate MR due to torsade post PCI, L LE ischemia with R iliac revascularization, CKD IV, CHF, DM.     PT Comments    Pt received with bowel incontinence; able to roll to provide peri care and change sacral foam pad. Progressing with physical therapy goals, requiring two person minimal assist and a walker for stand pivot transfers. Reports lower abdominal, cramping pain; RN notified. Continue to recommend SNF for ongoing Physical Therapy.      Follow Up Recommendations  SNF     Equipment Recommendations  Other (comment) (defer)    Recommendations for Other Services       Precautions / Restrictions Precautions Precautions: Fall Restrictions Weight Bearing Restrictions: Yes LLE Weight Bearing: Non weight bearing    Mobility  Bed Mobility Overal bed mobility: Needs Assistance Bed Mobility: Supine to Sit;Rolling Rolling: Min assist   Supine to sit: Min assist     General bed mobility comments: Rolling for min assist to right and left for peri care and placement of sacral foam pad. MinA for LE negotiation off edge of bed, pt pushing off bed rails to upright position  Transfers Overall transfer level: Needs assistance Equipment used: Rolling walker (2 wheeled) Transfers: Sit to/from [facility] Sit to Stand: Min assist;+2 physical assistance Stand pivot transfers: Min assist;+2 physical assistance       General transfer comment: MinA + 2 to rise and initially steady and pivot towards right to recliner  Ambulation/Gait                 Stairs             Wheelchair Mobility    Modified Rankin (Stroke Patients Only)        Balance Overall balance assessment: Needs assistance Sitting-balance support: Feet unsupported Sitting balance-Leahy Scale: Good     Standing balance support: Bilateral upper extremity supported;During functional activity Standing balance-Leahy Scale: Poor Standing balance comment: use of walker                            Cognition Arousal/Alertness: Awake/alert Behavior During Therapy: WFL for tasks assessed/performed Overall Cognitive Status: Within Functional Limits for tasks assessed                                 General Comments: Pt appropriate, following all commands, higher level cognition not assessed      Exercises General Exercises - Lower Extremity Gluteal Sets: Both;10 reps;Seated Long Arc Quad: Right;Seated;15 reps Hip Flexion/Marching: Right;10 reps;Seated    General Comments        Pertinent Vitals/Pain Pain Assessment: Faces Faces Pain Scale: Hurts even more Pain Location: abdominal cramping, LLE Pain Descriptors / Indicators: Grimacing;Guarding;Discomfort;Sore Pain Intervention(s): Limited activity within patient's tolerance;Monitored during session;Other (comment);Repositioned (RN notified)    Home Living                      Prior Function            PT Goals (current goals can now be found in the care plan  section) Acute Rehab PT Goals Patient Stated Goal: to go to rehab then home PT Goal Formulation: With patient/family Time For Goal Achievement: 11/26/20 Potential to Achieve Goals: Good Progress towards PT goals: Progressing toward goals    Frequency    Min 2X/week      PT Plan Current plan remains appropriate    Co-evaluation              AM-PAC PT "6 Clicks" Mobility   Outcome Measure  Help needed turning from your back to your side while in a flat bed without using bedrails?: None Help needed moving from lying on your back to sitting on the side of a flat bed without using  bedrails?: A Little Help needed moving to and from a bed to a chair (including a wheelchair)?: A Little Help needed standing up from a chair using your arms (e.g., wheelchair or bedside chair)?: A Little Help needed to walk in hospital room?: A Lot Help needed climbing 3-5 steps with a railing? : Total 6 Click Score: 16    End of Session Equipment Utilized During Treatment: Gait belt Activity Tolerance: Patient tolerated treatment well Patient left: in chair;with call bell/phone within reach;with chair alarm set Nurse Communication: Mobility status PT Visit Diagnosis: Pain;Difficulty in walking, not elsewhere classified (R26.2);Other abnormalities of gait and mobility (R26.89);Unsteadiness on feet (R26.81);Muscle weakness (generalized) (M62.81) Pain - Right/Left: Left Pain - part of body: Leg     Time: 7322-0254 PT Time Calculation (min) (ACUTE ONLY): 22 min  Charges:  $Therapeutic Activity: 8-22 mins                     Wyona Almas, PT, DPT Acute Rehabilitation Services Pager [phone] Office [phone]    Deno Etienne 11/12/2020, 2:32 PM

## 2020-11-12 NOTE — Progress Notes (Signed)
Pt c/o of abdominal cramping 10/10, no relief with oxy. No other PRN to give. Page MD.

## 2020-11-13 DIAGNOSIS — R338 Other retention of urine: Secondary | ICD-10-CM | POA: Diagnosis not present

## 2020-11-13 DIAGNOSIS — L089 Local infection of the skin and subcutaneous tissue, unspecified: Secondary | ICD-10-CM | POA: Diagnosis not present

## 2020-11-13 DIAGNOSIS — Z66 Do not resuscitate: Secondary | ICD-10-CM | POA: Diagnosis not present

## 2020-11-13 DIAGNOSIS — R5081 Fever presenting with conditions classified elsewhere: Secondary | ICD-10-CM | POA: Diagnosis not present

## 2020-11-13 LAB — GLUCOSE, CAPILLARY
Glucose-Capillary: 102 mg/dL — ABNORMAL HIGH (ref 70–99)
Glucose-Capillary: 87 mg/dL (ref 70–99)
Glucose-Capillary: 106 mg/dL — ABNORMAL HIGH (ref 70–99)
Glucose-Capillary: 102 mg/dL — ABNORMAL HIGH (ref 70–99)
Glucose-Capillary: 124 mg/dL — ABNORMAL HIGH (ref 70–99)
Glucose-Capillary: 145 mg/dL — ABNORMAL HIGH (ref 70–99)

## 2020-11-13 LAB — CBC WITH DIFFERENTIAL/PLATELET
Abs Immature Granulocytes: 0.07 10*3/uL (ref 0.00–0.07)
Basophils Absolute: 0 10*3/uL (ref 0.0–0.1)
Basophils Relative: 0 %
Eosinophils Absolute: 0.6 10*3/uL — ABNORMAL HIGH (ref 0.0–0.5)
Eosinophils Relative: 6 %
HCT: 23.9 % — ABNORMAL LOW (ref 39.0–52.0)
Hemoglobin: 7.6 g/dL — ABNORMAL LOW (ref 13.0–17.0)
Immature Granulocytes: 1 %
Lymphocytes Relative: 11 %
Lymphs Abs: 1.2 10*3/uL (ref 0.7–4.0)
MCH: 28.9 pg (ref 26.0–34.0)
MCHC: 31.8 g/dL (ref 30.0–36.0)
MCV: 90.9 fL (ref 80.0–100.0)
Monocytes Absolute: 0.7 10*3/uL (ref 0.1–1.0)
Monocytes Relative: 7 %
Neutro Abs: 8.4 10*3/uL — ABNORMAL HIGH (ref 1.7–7.7)
Neutrophils Relative %: 75 %
Platelets: 242 10*3/uL (ref 150–400)
RBC: 2.63 MIL/uL — ABNORMAL LOW (ref 4.22–5.81)
RDW: 18.3 % — ABNORMAL HIGH (ref 11.5–15.5)
WBC: 11.1 10*3/uL — ABNORMAL HIGH (ref 4.0–10.5)
nRBC: 0 % (ref 0.0–0.2)

## 2020-11-13 LAB — COMPREHENSIVE METABOLIC PANEL
ALT: 75 U/L — ABNORMAL HIGH (ref 0–44)
AST: 50 U/L — ABNORMAL HIGH (ref 15–41)
Albumin: 1.9 g/dL — ABNORMAL LOW (ref 3.5–5.0)
Alkaline Phosphatase: 55 U/L (ref 38–126)
Anion gap: 8 (ref 5–15)
BUN: 23 mg/dL (ref 8–23)
CO2: 24 mmol/L (ref 22–32)
Calcium: 8.3 mg/dL — ABNORMAL LOW (ref 8.9–10.3)
Chloride: 109 mmol/L (ref 98–111)
Creatinine, Ser: 0.89 mg/dL (ref 0.61–1.24)
GFR, Estimated: >60 mL/min (ref >60)
Glucose, Bld: 105 mg/dL — ABNORMAL HIGH (ref 70–99)
Potassium: 3.5 mmol/L (ref 3.5–5.1)
Sodium: 141 mmol/L (ref 135–145)
Total Bilirubin: 0.6 mg/dL (ref 0.3–1.2)
Total Protein: 5.5 g/dL — ABNORMAL LOW (ref 6.5–8.1)

## 2020-11-13 LAB — PROCALCITONIN: Procalcitonin: 0.11 ng/mL

## 2020-11-13 MED ORDER — POLYETHYLENE GLYCOL 3350 17 G PO PACK
17.0000 g | PACK | Freq: Every day | ORAL | Status: DC
  Administered 2020-11-13 – 2020-11-14 (×2): 17 g via ORAL
  Filled 2020-11-13 (×3): qty 1

## 2020-11-13 NOTE — Progress Notes (Addendum)
Occupational Therapy Treatment Patient Details Name: Mike [name] MRN: [id number] DOB: 1961-08-31 Today's Date: 11/13/2020    History of present illness 59 year old man admitted on 10/30/20 with SOB and inability to walk with severe L LE pain. Pt with critical L LE ischemia. Underwent L AKA 11/06/20. PMH: STEMI 07/2020 with moderate MR due to torsade post PCI, L LE ischemia with R iliac revascularization, CKD IV, CHF, DM.    OT comments  Pt progressing overall towards acute OT goals. He was limited this session by onset of lightheadedness and dizziness once sitting EOB, worsened with time. Close min guard to min A with bed mobility. Sat EOB about a minute at supervision-min guard level but ultimately needed to return to supine. BP assessed in supine: 122/62. Symptoms stabilized but did not resolve by end of session, pt left in supine position. Nursing notified. D/c plan remains appropriate.    Follow Up Recommendations  SNF;Supervision/Assistance - 24 hour (pt and sister prefer SNF)    Equipment Recommendations  3 in 1 bedside commode;Tub/shower bench;Wheelchair (measurements OT);Wheelchair cushion (measurements OT)    Recommendations for Other Services      Precautions / Restrictions Precautions Precautions: Fall Precaution Comments: sacral wound, mod fall Restrictions Weight Bearing Restrictions: Yes LLE Weight Bearing: Non weight bearing       Mobility Bed Mobility Overal bed mobility: Needs Assistance Bed Mobility: Supine to Sit;Sit to Supine     Supine to sit: Min guard Sit to supine: Min assist   General bed mobility comments: close min guard and extra time/effort, + use of rails to come to EOB position but no direct physical assist. Lightheaded and dizzy in sitting position, worsened with time. Min A to fully advance RLE back onto bed to return to supine.   Transfers                 General transfer comment: unable to safely attempt this session due to onset  of lightheadedness/dizziness sitting EOB which worsened with time    Balance Overall balance assessment: Needs assistance Sitting-balance support:  (RLE support) Sitting balance-Leahy Scale: Good                                     ADL either performed or assessed with clinical judgement   ADL Overall ADL's : Needs assistance/impaired                                       General ADL Comments: Pt received in sidelying with NT present and providing bed level pericare. Able to roll onto back with no assist. Min guard A to come EOB position with extra time and effort. Lightheaded and dizzy once sitting EOB, worsened with time. At about the 1 and a half minute mark pt needing to return to supine. Vitals assessed in supine: bp 122/62.     Vision       Perception     Praxis      Cognition Arousal/Alertness: Awake/alert Behavior During Therapy: WFL for tasks assessed/performed Overall Cognitive Status: Within Functional Limits for tasks assessed  Exercises     Shoulder Instructions       General Comments      Pertinent Vitals/ Pain       Pain Assessment: Faces Faces Pain Scale: Hurts whole lot Pain Location: abdominal cramping/pain while having bowel movement in bed, LLE Pain Descriptors / Indicators: Grimacing;Moaning Pain Intervention(s): Monitored during session;Limited activity within patient's tolerance  Home Living                                          Prior Functioning/Environment              Frequency  Min 2X/week        Progress Toward Goals  OT Goals(current goals can now be found in the care plan section)  Progress towards OT goals: Progressing toward goals (overall)  Acute Rehab OT Goals Patient Stated Goal: to go to rehab then home OT Goal Formulation: With patient Time For Goal Achievement: 11/27/20 Potential to Achieve Goals:  Good ADL Goals Pt Will Perform Grooming: with modified independence;sitting Pt Will Perform Lower Body Bathing: sitting/lateral leans;with modified independence Pt Will Perform Lower Body Dressing: with modified independence;sitting/lateral leans Pt Will Transfer to Toilet: with modified independence;bedside commode Pt Will Perform Toileting - Clothing Manipulation and hygiene: with modified independence;sitting/lateral leans Pt Will Perform Tub/Shower Transfer: Tub transfer;with modified independence;ambulating;tub bench Additional ADL Goal #1: Pt will generalize energy conservation strategies in ADL and mobility.  Plan Discharge plan remains appropriate    Co-evaluation                 AM-PAC OT "6 Clicks" Daily Activity     Outcome Measure   Help from another person eating meals?: None Help from another person taking care of personal grooming?: A Little Help from another person toileting, which includes using toliet, bedpan, or urinal?: A Lot Help from another person bathing (including washing, rinsing, drying)?: A Lot Help from another person to put on and taking off regular upper body clothing?: A Little Help from another person to put on and taking off regular lower body clothing?: A Lot 6 Click Score: 16    End of Session    OT Visit Diagnosis: Unsteadiness on feet (R26.81);Other abnormalities of gait and mobility (R26.89);Pain   Activity Tolerance Other (comment) (+ lightheaded and dizzy EOB, worsened with time)   Patient Left in bed;with call bell/phone within reach;with bed alarm set   Nurse Communication Other (comment) (+ lightheaded/dizzy onset EOB, has not resolved, pt in supin)        Time: 6203-5597 OT Time Calculation (min): 22 min  Charges: OT General Charges $OT Visit: 1 Visit OT Treatments $Self Care/Home Management : 8-22 mins  Mike [name], [place] Pager: [phone] Office: [phone]    Mike [name] 11/13/2020, 10:37 AM

## 2020-11-13 NOTE — Progress Notes (Signed)
[facility]  PROGRESS NOTE  Mike [name] IRJ:[id number] DOB: June 02, 1961 DOA: 10/30/2020 PCP: Patient, No Pcp Per Admit date - 10/30/2020   Admitting Physician Eben Burow, MD  Outpatient Primary MD for the patient is Patient, No Pcp Per  LOS - 26 Brief Narrative   [name] is a 59 year old male with medical history significant for STEMI (02/26/6009) complicated by cardiac arrest secondary to sides post PCI with chronic ST elevations in inferior leads since, severe bilateral renal artery stenosis, PAD with history of critical limb ischemia requiring right iliac revascularization of left lower extremity, chronic systolic CHF related to ischemic cardiomyopathy who presented on 11/2 with complaints of shortness of breath decrease ability to walk due to severe leg pain found to have critical limbischemia of left leg.   Patient has had ongoing issues with atherosclerotic vascular disease of the left leg but given his comorbidities (recent STEMI still requiring aspirin and Brilinta, CKD, depressed EF patient was too high risk for surgical revascularization per vascular surgery, but due to current hospitalization and further decompensation of symptoms after surgery elected to do above-knee amputation to deviate his rest pain.  Patient was transitioned from aspirin Brilinta aspirin and Plavix and underwent left AKA by Dr. Donnetta Hutching 11/9.  Subjective  Still has quite a bit of pain at the site of his gluteal fold wound particularly when using the bathroom.  States his abdominal pain resolved with BM.  Denies cough, chest pain or shortness of breath   A & P   Fever, suspect soft skin tissue infection of sacral ulcer site and potential pneumonia.  Chest x-ray mentions possible opacity versus atelectasis, patient has no cough, no dyspnea, has downtrending leukocytosis.  Evaluation of decubitus ulcer of sacrum there are some open wounds that seem consistent with soft skin tissue infection of  gluteal fold given worsening pain on palpation and erythema this is likely source.  Repeat Covid PCR negative -Monitor blood cultures -Started on 11/15 on vancomycin, cefepime given infection during hospital stay blood cultures negative, MRSA PCR negative, will discontinue vancomycin -Expect may be able to titrate to oral coverage for gram-positive abdominal blood culture remain unremarkable 4 hours -Wound care consulted given sacral decubitus ulcer.    Sacral decubitus ulcer with wound in gluteal fold.  Stooling present.  Tender to palpation.  Obvious erythema.  No obvious drainage. -Wound care recommends applying Santyl to the gluteal fold and cover with saline moistened gauze and foam dressing, change daily -Continue antibiotics as mentioned above  Confusion, intermittent, stable.  Disorientation overnight on 11/14.  Currently alert and oriented x4.  Could be hospital-acquired delirium.  Also concern this could be related to possible infection as mentioned above. Ammonia unremarkable, glucose unremarkable -Continue to closely monitor mental status -Delirium precautions  Asymptomatic Sinus Pauses. Noted on telemetry in setting of coreg, again occurred while on Cordarone.  Suspect related to possible OSA -monitor on telemetry -coreg and corlanor Discontinued, appreciate cardiology recommendations -goal K> 4, Mg> 2, replete as necessary  Abdominal pain, improved.  Suspect constipation related to opioid pain management.  Better with BMs.  Abdominal x-ray nonacute.  Slight elevation of LFTs, chronically -Continue bowel regimen while on pain control with opioids -Continue monitor output  Severe PAD with gangrene of left leg with no revascularization options, status post left AKA due to gangrene and intolerable pain/rest pain below/9.  Pain is much better controlled.  OFF PCA pump. Stump healing well  -Oxycontin discontinued -Oral oxycodone 5 mg IR, every 4  hours as needed moderate pain,  scheduled gabapentin, daily MiraLAX, scheduled senna docusate --vascular outpatient follow up w/Dr. Early in 4 weeks (his office will call), maintain staples for 4 weeks.   AKI on CKD stage III, improved.  Occurred in setting of contrast received after peripheral arteriogram.  Creatinine back at baseline -Avoid nephrotoxins  Chronic systolic failure in setting of ischemic cardiomyopathy (recent STEMI in August 2020) history of), euvolemic on exam -Coreg discontinued given sinus pause overnight -D/c entresto due to low BP -Monitor renal function, avoid dehydration/hypotension -Patient is DNR, cardiology does not recommend removing LifeVest or ICD given risk for infection -Discontinued Corlanor given pauses -Continue aspirin and Plavix -- crestor 10 mg  AKI on CKD stage III, resolved.  Prior to this hospitalization previous creatinine baseline of 1.06.  Peak creatinine this admission of 6.34, has continued to downtrend, now 1.34.  Suspect was prerenal etiology in the setting of NSAIDs and contrast from arteriogram this hospital stay -Avoid nephrotoxins -Monitor BMP  Chronic hypotension,stable.  SBP stable ranging 90s-low 100s.   -entresto discontinued due to low BP --coreg discontinued due to sinus pauses  Mild anion gap metabolic acidosis likely in the setting of CKD now resolved. nadir of 11 -Continue sodium bicarb  Type 2 diabetes, A1c 7.1. glucose at goal -Sliding scale insulin as needed, monitor CBGs  Acute urinary retention. Suspect related to recent surgery and decreased mobility. UA unremarkable. In and out caht x2 for urine retention x 500 cc in 24 hours. Foley placed on 11/12 -continue foley and due voiding trial likely on discharge -if persists will need urology follow up for outpatient evaluation.  Normocytic anemia. Acutely worsened from postoperative anemia. Hgb has been stable at 7 since admission. No current signs or symptoms of bleeding. -on aspirin and  plavix -monitor cbc, transfuse if hgb less than 7 -daily cbc  Elevated LFTs, chronic.  AST 50, ALT 75; previous high of AST 235, ALT 123 due to congestive hepatopathy on admission..  Bilirubin unremarkable none -check hepatitis panel for thoroughness -Could also be a degree of fatty liver, may warrant ultrasound if increases again    Family Communication  : None at bedside, sister updated at bedside on 11/12  Code Status : DNR  Disposition Plan  :  Patient is from home. Anticipated d/c date: 2 to 3 days likely to SNF . Barriers to d/c or necessity for inpatient status: Currently requiring IV antibiotics given soft skin tissue infection of sacral/gluteal wound, monitoring LFTs Consults  : Nephrology, vascular surgery, cardiology  Procedures  :    DVT Prophylaxis  : Heparin MDM: The below labs and imaging reports were reviewed and summarized above.  Medication management as above.  Lab Results  Component Value Date   PLT 242 11/13/2020    Diet :  Diet Order            Diet Heart Room service appropriate? Yes; Fluid consistency: Thin  Diet effective now                  Inpatient Medications Scheduled Meds: . aspirin EC  81 mg Oral Daily  . clopidogrel  75 mg Oral Daily  . collagenase   Topical Daily  . feeding supplement (GLUCERNA SHAKE)  237 mL Oral QID  . gabapentin  300 mg Oral BID  . Gerhardt's butt cream   Topical BID  . insulin aspart  0-6 Units Subcutaneous Q4H  . multivitamin with minerals  1 tablet Oral Daily  .  nicotine  7 mg Transdermal Q24H  . polyethylene glycol  17 g Oral Daily  . rosuvastatin  10 mg Oral Daily  . senna-docusate  2 tablet Oral BID  . sodium bicarbonate  1,300 mg Oral BID   Continuous Infusions: . sodium chloride 250 mL (11/12/20 1221)  . ceFEPime (MAXIPIME) IV Stopped (11/13/20 1243)  . magnesium sulfate bolus IVPB    . vancomycin 750 mg (11/13/20 1308)   PRN Meds:.sodium chloride, acetaminophen **OR** acetaminophen,  nitroGLYCERIN, oxyCODONE  Antibiotics  :   Anti-infectives (From admission, onward)   Start     Dose/Rate Route Frequency Ordered Stop   11/13/20 0200  vancomycin (VANCOREADY) IVPB 750 mg/150 mL        750 mg 150 mL/hr over 60 Minutes Intravenous Every 12 hours 11/12/20 1042     11/12/20 1130  ceFEPIme (MAXIPIME) 2 g in sodium chloride 0.9 % 100 mL IVPB        2 g 200 mL/hr over 30 Minutes Intravenous Every 8 hours 11/12/20 1042     11/12/20 1130  vancomycin (VANCOREADY) IVPB 1250 mg/250 mL        1,250 mg 166.7 mL/hr over 90 Minutes Intravenous  Once 11/12/20 1042 11/13/20 0800   11/06/20 0930  ceFAZolin (ANCEF) IVPB 2g/100 mL premix  Status:  Discontinued        2 g 200 mL/hr over 30 Minutes Intravenous To Surgery 11/06/20 0922 11/06/20 1340       Objective   Vitals:   11/13/20 0240 11/13/20 0404 11/13/20 1000 11/13/20 1106  BP:  124/60 122/62 (!) 118/52  Pulse:  64 78 71  Resp:  19  15  Temp:  98.6 F (37 C)  98.3 F (36.8 C)  TempSrc:  Oral  Oral  SpO2:  99% 99% 99%  Weight: 65.5 kg     Height:        SpO2: 99 % O2 Flow Rate (L/min): 2 L/min  Wt Readings from Last 3 Encounters:  11/13/20 65.5 kg  10/26/20 69.7 kg  10/22/20 69.9 kg     Intake/Output Summary (Last 24 hours) at 11/13/2020 1404 Last data filed at 11/13/2020 1257 Gross per 24 hour  Intake 706.54 ml  Output 600 ml  Net 106.54 ml    Physical Exam:     alert and oriented x4, normal affect No new F.N deficits,  [place].AT, Normal respiratory effort on room air, CTAB Abdomen soft, nontender, no rebound tenderness or guarding RRR,No Gallops,Rubs or new Murmurs, Left AKA with dressing in place, staples in place, wound healing well with no erythema or tenderness Erythema of sacrum extending to gluteal fold with ulcerations but no obvious drainage and stool present      +ve B.Sounds, Abd Soft, No tenderness, No rebound, guarding or rigidity. No Cyanosis, No new Rash or bruise     I have  personally reviewed the following:   Data Reviewed:  CBC Recent Labs  Lab 11/09/20 0343 11/10/20 0449 11/11/20 0141 11/12/20 0601 11/13/20 0409  WBC 12.9* 10.0 12.3* 11.5* 11.1*  HGB 7.2* 7.0* 7.8* 7.5* 7.6*  HCT 22.4* 22.3* 25.1* 23.8* 23.9*  PLT 205 191 226 220 242  MCV 90.0 88.8 89.3 90.5 90.9  MCH 28.9 27.9 27.8 28.5 28.9  MCHC 32.1 31.4 31.1 31.5 31.8  RDW 18.6* 18.5* 18.4* 18.2* 18.3*  LYMPHSABS 1.5 1.2 0.7 0.8 1.2  MONOABS 1.0 0.7 0.8 0.8 0.7  EOSABS 0.4 0.4 0.3 0.4 0.6*  BASOSABS 0.0 0.0 0.0 0.0 0.0  Chemistries  Recent Labs  Lab 11/08/20 0213 11/08/20 0213 11/09/20 0343 11/10/20 0544 11/11/20 0141 11/12/20 0601 11/12/20 1505 11/13/20 0409  NA 140  --  137  --  137 139  --  141  K 4.2   < > 3.7 4.0 3.4* 3.8  --  3.5  CL 108  --  105  --  104 107  --  109  CO2 26  --  25  --  24 23  --  24  GLUCOSE 114*  --  98  --  128* 111*  --  105*  BUN 49*  --  37*  --  24* 22  --  23  CREATININE 1.29*  --  1.12  --  1.06 1.13  --  0.89  CALCIUM 8.7*  --  8.2*  --  8.3* 8.3*  --  8.3*  MG  --   --   --  1.8 1.7  --   --   --   AST  --   --   --   --   --   --  55* 50*  ALT  --   --   --   --   --   --  72* 75*  ALKPHOS  --   --   --   --   --   --  51 55  BILITOT  --   --   --   --   --   --  0.6 0.6   < > = values in this interval not displayed.   ------------------------------------------------------------------------------------------------------------------ No results for input(s): CHOL, HDL, LDLCALC, TRIG, CHOLHDL, LDLDIRECT in the last 72 hours.  Lab Results  Component Value Date   HGBA1C 7.1 (H) 10/31/2020   ------------------------------------------------------------------------------------------------------------------ No results for input(s): TSH, T4TOTAL, T3FREE, THYROIDAB in the last 72 hours.  Invalid input(s): FREET3 ------------------------------------------------------------------------------------------------------------------ No results  for input(s): VITAMINB12, FOLATE, FERRITIN, TIBC, IRON, RETICCTPCT in the last 72 hours.  Coagulation profile No results for input(s): INR, PROTIME in the last 168 hours.  No results for input(s): DDIMER in the last 72 hours.  Cardiac Enzymes No results for input(s): CKMB, TROPONINI, MYOGLOBIN in the last 168 hours.  Invalid input(s): CK ------------------------------------------------------------------------------------------------------------------    Component Value Date/Time   BNP 488.4 (H) 10/30/2020 1820    Micro Results Recent Results (from the past 240 hour(s))  Surgical PCR screen     Status: None   Collection Time: 11/06/20  2:40 AM   Specimen: Nasal Mucosa; Nasal Swab  Result Value Ref Range Status   MRSA, PCR NEGATIVE NEGATIVE Final   Staphylococcus aureus NEGATIVE NEGATIVE Final    Comment: (NOTE) The Xpert SA Assay (FDA approved for NASAL specimens in patients 42 years of age and older), is one component of a comprehensive surveillance program. It is not intended to diagnose infection nor to guide or monitor treatment. Performed at [facility], [place] [address] [place], [place] [address]   Culture, blood (routine x 2)     Status: None (Preliminary result)   Collection Time: 11/11/20  8:23 AM   Specimen: BLOOD  Result Value Ref Range Status   Specimen Description BLOOD RIGHT ANTECUBITAL  Final   Special Requests   Final    BOTTLES DRAWN AEROBIC AND ANAEROBIC Blood Culture results may not be optimal due to an inadequate volume of blood received in culture bottles   Culture   Final    NO GROWTH 1 DAY Performed at [facility], [place] [address] [place], [place] [address]    Report Status PENDING  Incomplete  Culture, blood (routine x 2)     Status: None (Preliminary result)   Collection Time: 11/11/20  8:28 AM   Specimen: BLOOD  Result Value Ref Range Status   Specimen Description BLOOD LEFT ANTECUBITAL  Final   Special Requests   Final     BOTTLES DRAWN AEROBIC ONLY Blood Culture results may not be optimal due to an inadequate volume of blood received in culture bottles   Culture   Final    NO GROWTH 1 DAY Performed at [facility], [place] [address], [place], [place] [address]    Report Status PENDING  Incomplete  Culture, Urine     Status: None   Collection Time: 11/11/20  8:35 AM   Specimen: Urine, Catheterized  Result Value Ref Range Status   Specimen Description URINE, CATHETERIZED  Final   Special Requests NONE  Final   Culture   Final    NO GROWTH Performed at [facility], [address], [place], [place] [address]    Report Status 11/12/2020 FINAL  Final  SARS Coronavirus 2 by RT PCR (hospital order, performed in [facility] hospital lab) Nasopharyngeal Nasopharyngeal Swab     Status: None   Collection Time: 11/12/20 10:19 AM   Specimen: Nasopharyngeal Swab  Result Value Ref Range Status   SARS Coronavirus 2 NEGATIVE NEGATIVE Final    Comment: (NOTE) SARS-CoV-2 target nucleic acids are NOT DETECTED.  The SARS-CoV-2 RNA is generally detectable in upper and lower respiratory specimens during the acute phase of infection. The lowest concentration of SARS-CoV-2 viral copies this assay can detect is 250 copies / mL. A negative result does not preclude SARS-CoV-2 infection and should not be used as the sole basis for treatment or other patient management decisions.  A negative result may occur with improper specimen collection / handling, submission of specimen other than nasopharyngeal swab, presence of viral mutation(s) within the areas targeted by this assay, and inadequate number of viral copies (<250 copies / mL). A negative result must be combined with clinical observations, patient history, and epidemiological information.  Fact Sheet for Patients:   StrictlyIdeas.no  Fact Sheet for Healthcare Providers: BankingDealers.co.za  This test is not  yet approved or  cleared by the [place] FDA and has been authorized for detection and/or diagnosis of SARS-CoV-2 by FDA under an Emergency Use Authorization (EUA).  This EUA will remain in effect (meaning this test can be used) for the duration of the COVID-19 declaration under Section 564(b)(1) of the Act, 21 U.S.C. section 360bbb-3(b)(1), unless the authorization is terminated or revoked sooner.  Performed at [facility], [place] [address], [place], [place] [address]     Radiology Reports US RENAL  Result Date: 11/08/2020 CLINICAL DATA:  Urinary retention EXAM: RENAL / URINARY TRACT ULTRASOUND COMPLETE COMPARISON:  10/17/2020 FINDINGS: Right Kidney: Renal measurements: 11.8 x 5.4 x 5.8 cm = volume: 194 mL. Echogenicity within normal limits. No mass or hydronephrosis visualized. Left Kidney: Renal measurements: 10.4 x 4.7 x 4.6 cm = volume: 119 mL. Echogenicity within normal limits. Incidental simple cyst measuring 1.5 cm. No mass or hydronephrosis visualized. Bladder: Prevoid urinary bladder volume 635 mL. Patient is unable to void at the time of examination. Other: None. IMPRESSION: 1. No hydronephrosis. 2. Prevoid urinary bladder volume 635 mL. Patient is unable to void at the time of examination. Nursing staff  notified by sonographer. Electronically Signed   By: Eddie Candle M.D.   On: 11/08/2020 18:29   US RENAL  Result Date: 10/17/2020 CLINICAL DATA:  Acute kidney injury EXAM: RENAL / URINARY TRACT ULTRASOUND COMPLETE COMPARISON:  None. FINDINGS: Right Kidney: Renal measurements: 11.8 x 5.6 x 5.7 cm = volume: 197.9 mL. Echogenicity and renal cortical thickness are within normal limits. No mass, perinephric fluid, or hydronephrosis visualized. No sonographically demonstrable calculus or ureterectasis. Left Kidney: Renal measurements: 10.7 x 4.7 x 4.1 cm = volume: 109.5 mL. Echogenicity and renal cortical thickness are within normal limits. No perinephric fluid or hydronephrosis  visualized. There is a cyst arising from the lower pole left kidney measuring 1.8 x 1.4 x 1.5 cm. No sonographically demonstrable calculus or ureterectasis. Bladder: Appears normal for degree of bladder distention. Other: None. IMPRESSION: Cyst arising from lower pole left kidney measuring 1.8 x 1.4 x 1.5 cm. Study otherwise unremarkable. Electronically Signed   By: Lowella Grip III M.D.   On: 10/17/2020 15:28   PERIPHERAL VASCULAR CATHETERIZATION  Result Date: 10/16/2020 Successful intravascular lithotripsy, PTCA and stenting     7.0X39 mm balloon expandable Viabahn VBX stent Rt common iliac artery     7.0X60 mm self expanding Absolute Pro stent Rt distal iliac artery No named vessels at ankle level in both lower extremities. Will consult vascular surgery for Rt fem-to-left profunda bypass given left foot critical limb ischemia. Nigel Mormon, MD Pager: [phone] Office: [phone]   DG CHEST PORT 1 VIEW  Result Date: 11/11/2020 CLINICAL DATA:  Fever and chest pain. EXAM: PORTABLE CHEST 1 VIEW COMPARISON:  October 30, 2020 FINDINGS: Cardiomediastinal silhouette is normal. Mediastinal contours appear intact. Mild streaky airspace opacities in the lung bases. Osseous structures are without acute abnormality. Soft tissues are grossly normal. IMPRESSION: Mild streaky airspace opacities in the lung bases may represent atelectasis or atypical/viral pneumonia. Electronically Signed   By: Fidela Salisbury M.D.   On: 11/11/2020 10:28   DG Chest Portable 1 View  Result Date: 10/30/2020 CLINICAL DATA:  Chest pain EXAM: PORTABLE CHEST 1 VIEW COMPARISON:  08/03/2020 FINDINGS: Single frontal view of the chest demonstrates stenting artifact from external cardiac device. Cardiac silhouette is unremarkable. No airspace disease, effusion, or pneumothorax. No acute bony abnormalities. IMPRESSION: 1. No acute intrathoracic process. Electronically Signed   By: Randa Ngo M.D.   On: 10/30/2020 17:01    DG Abd Portable 1V  Result Date: 11/12/2020 CLINICAL DATA:  Abdominal pain EXAM: PORTABLE ABDOMEN - 1 VIEW COMPARISON:  None. FINDINGS: Gas in nondilated stomach and colon. No bowel obstruction. There is stool and gas in the rectum. No bowel wall edema. Negative for urinary tract calcifications. No skeletal abnormality. Right iliac stents noted. IMPRESSION: Normal bowel gas pattern. Electronically Signed   By: Franchot Gallo M.D.   On: 11/12/2020 18:00   VAS US CAROTID  Result Date: 10/17/2020 Carotid Arterial Duplex Study Indications:  Pre-surgical evaluation. Risk Factors: Hypertension, hyperlipidemia, current smoker, coronary artery               disease, PAD. Performing Technologist: Maudry Mayhew MHA, RDMS, RVT, RDCS  Examination Guidelines: A complete evaluation includes B-mode imaging, spectral Doppler, color Doppler, and power Doppler as needed of all accessible portions of each vessel. Bilateral testing is considered an integral part of a complete examination. Limited examinations for reoccurring indications may be performed as noted.  Right Carotid Findings: +----------+--------+--------+--------+-----------------------+--------+           PSV cm/sEDV  cm/sStenosisPlaque Description     Comments +----------+--------+--------+--------+-----------------------+--------+ CCA Prox  186                     smooth and heterogenous         +----------+--------+--------+--------+-----------------------+--------+ CCA Distal87      16              smooth and heterogenous         +----------+--------+--------+--------+-----------------------+--------+ ICA Prox  56      13              smooth and heterogenous         +----------+--------+--------+--------+-----------------------+--------+ ICA Distal70      15                                              +----------+--------+--------+--------+-----------------------+--------+ ECA       139     7                smooth and heterogenous         +----------+--------+--------+--------+-----------------------+--------+ +----------+--------+-------+----------------+-------------------+           PSV cm/sEDV cmsDescribe        Arm Pressure (mmHG) +----------+--------+-------+----------------+-------------------+ [id number]            Multiphasic, WNL                    +----------+--------+-------+----------------+-------------------+ +---------+--------+--+--------+--+---------+ VertebralPSV cm/s78EDV cm/s29Antegrade +---------+--------+--+--------+--+---------+  Left Carotid Findings: +----------+--------+--------+--------+-----------------------+--------+           PSV cm/sEDV cm/sStenosisPlaque Description     Comments +----------+--------+--------+--------+-----------------------+--------+ CCA Prox  207     10                                              +----------+--------+--------+--------+-----------------------+--------+ CCA [id number]     14              smooth and heterogenous         +----------+--------+--------+--------+-----------------------+--------+ ICA Prox  81      17              smooth and heterogenous         +----------+--------+--------+--------+-----------------------+--------+ ICA Distal109     29                                              +----------+--------+--------+--------+-----------------------+--------+ ECA       240     14              smooth and heterogenous         +----------+--------+--------+--------+-----------------------+--------+ +----------+--------+--------+--------+-------------------+           PSV cm/sEDV cm/sDescribeArm Pressure (mmHG) +----------+--------+--------+--------+-------------------+ Subclavian310                                         +----------+--------+--------+--------+-------------------+ +---------+--------+---+--------+--+---------------------------------+ VertebralPSV  cm/s293EDV cm/s47>50% cervical stenosis, antegrade +---------+--------+---+--------+--+---------------------------------+   Summary: Right Carotid: Velocities in the right ICA are consistent with a 1-39% stenosis. Left Carotid: Velocities in the left ICA are  consistent with a 1-39% stenosis. Vertebrals:  Bilateral vertebral arteries demonstrate antegrade flow. Left >50%              cervical stenosis. Subclavians: Normal flow hemodynamics were seen in bilateral subclavian              arteries. *See table(s) above for measurements and observations.  Electronically signed by Harold Barban MD on 10/17/2020 at 7:38:49 PM.    Final      Time Spent in minutes  30     Desiree Hane M.D on 11/13/2020 at 2:04 PM  To page go to [email or web address] - password [facility]

## 2020-11-13 NOTE — Plan of Care (Signed)
  Problem: Education: Goal: Knowledge of General Education information will improve Description Including pain rating scale, medication(s)/side effects and non-pharmacologic comfort measures Outcome: Progressing   

## 2020-11-13 NOTE — Progress Notes (Signed)
Chart and telemetry reviewed. No sinus pauses longer than 6 sec Continue management as per primary team   Nigel Mormon, MD Pager: [phone] Office: [phone]

## 2020-11-13 NOTE — Plan of Care (Signed)
  Problem: Education: Goal: Knowledge of General Education information will improve Description: Including pain rating scale, medication(s)/side effects and non-pharmacologic comfort measures 11/13/2020 2022 by Nelia Shi, RN Outcome: Progressing 11/13/2020 2022 by Nelia Shi, RN Outcome: Progressing

## 2020-11-14 ENCOUNTER — Inpatient Hospital Stay (HOSPITAL_COMMUNITY): Payer: BC Managed Care – PPO

## 2020-11-14 DIAGNOSIS — Z9889 Other specified postprocedural states: Secondary | ICD-10-CM | POA: Diagnosis not present

## 2020-11-14 DIAGNOSIS — I70229 Atherosclerosis of native arteries of extremities with rest pain, unspecified extremity: Secondary | ICD-10-CM | POA: Diagnosis not present

## 2020-11-14 DIAGNOSIS — I739 Peripheral vascular disease, unspecified: Secondary | ICD-10-CM | POA: Diagnosis not present

## 2020-11-14 LAB — SARS CORONAVIRUS 2 BY RT PCR (HOSPITAL ORDER, PERFORMED IN ~~LOC~~ HOSPITAL LAB): SARS Coronavirus 2: NEGATIVE

## 2020-11-14 LAB — GLUCOSE, CAPILLARY
Glucose-Capillary: 111 mg/dL — ABNORMAL HIGH (ref 70–99)
Glucose-Capillary: 114 mg/dL — ABNORMAL HIGH (ref 70–99)
Glucose-Capillary: 117 mg/dL — ABNORMAL HIGH (ref 70–99)
Glucose-Capillary: 136 mg/dL — ABNORMAL HIGH (ref 70–99)
Glucose-Capillary: 145 mg/dL — ABNORMAL HIGH (ref 70–99)
Glucose-Capillary: 88 mg/dL (ref 70–99)

## 2020-11-14 LAB — CBC WITH DIFFERENTIAL/PLATELET
Abs Immature Granulocytes: 0.05 10*3/uL (ref 0.00–0.07)
Basophils Absolute: 0 10*3/uL (ref 0.0–0.1)
Basophils Relative: 0 %
Eosinophils Absolute: 0.5 10*3/uL (ref 0.0–0.5)
Eosinophils Relative: 6 %
HCT: 22.9 % — ABNORMAL LOW (ref 39.0–52.0)
Hemoglobin: 7.1 g/dL — ABNORMAL LOW (ref 13.0–17.0)
Immature Granulocytes: 1 %
Lymphocytes Relative: 14 %
Lymphs Abs: 1.2 10*3/uL (ref 0.7–4.0)
MCH: 28 pg (ref 26.0–34.0)
MCHC: 31 g/dL (ref 30.0–36.0)
MCV: 90.2 fL (ref 80.0–100.0)
Monocytes Absolute: 0.5 10*3/uL (ref 0.1–1.0)
Monocytes Relative: 6 %
Neutro Abs: 6.3 10*3/uL (ref 1.7–7.7)
Neutrophils Relative %: 73 %
Platelets: 242 10*3/uL (ref 150–400)
RBC: 2.54 MIL/uL — ABNORMAL LOW (ref 4.22–5.81)
RDW: 18.2 % — ABNORMAL HIGH (ref 11.5–15.5)
WBC: 8.7 10*3/uL (ref 4.0–10.5)
nRBC: 0 % (ref 0.0–0.2)

## 2020-11-14 LAB — HEPATITIS PANEL, ACUTE
HCV Ab: 0.2 s/co ratio — AB (ref 0.0–0.9)
Hep A IgM: NEGATIVE — AB
Hep B C IgM: NEGATIVE — AB
Hepatitis B Surface Ag: NEGATIVE — AB

## 2020-11-14 LAB — COMPREHENSIVE METABOLIC PANEL
ALT: 70 U/L — ABNORMAL HIGH (ref 0–44)
AST: 47 U/L — ABNORMAL HIGH (ref 15–41)
Albumin: 1.8 g/dL — ABNORMAL LOW (ref 3.5–5.0)
Alkaline Phosphatase: 54 U/L (ref 38–126)
Anion gap: 7 (ref 5–15)
BUN: 18 mg/dL (ref 8–23)
CO2: 22 mmol/L (ref 22–32)
Calcium: 8 mg/dL — ABNORMAL LOW (ref 8.9–10.3)
Chloride: 112 mmol/L — ABNORMAL HIGH (ref 98–111)
Creatinine, Ser: 0.76 mg/dL (ref 0.61–1.24)
GFR, Estimated: >60 mL/min (ref >60)
Glucose, Bld: 96 mg/dL (ref 70–99)
Potassium: 3.2 mmol/L — ABNORMAL LOW (ref 3.5–5.1)
Sodium: 141 mmol/L (ref 135–145)
Total Bilirubin: 0.5 mg/dL (ref 0.3–1.2)
Total Protein: 5 g/dL — ABNORMAL LOW (ref 6.5–8.1)

## 2020-11-14 LAB — PROCALCITONIN: Procalcitonin: <0.1 ng/mL

## 2020-11-14 MED ORDER — METRONIDAZOLE 500 MG PO TABS
500.0000 mg | ORAL_TABLET | Freq: Three times a day (TID) | ORAL | Status: DC
  Administered 2020-11-14 – 2020-11-15 (×3): 500 mg via ORAL
  Filled 2020-11-14 (×3): qty 1

## 2020-11-14 MED ORDER — SULFAMETHOXAZOLE-TRIMETHOPRIM 800-160 MG PO TABS
1.0000 | ORAL_TABLET | Freq: Two times a day (BID) | ORAL | Status: DC
  Administered 2020-11-14 – 2020-11-15 (×2): 1 via ORAL
  Filled 2020-11-14 (×3): qty 1

## 2020-11-14 NOTE — Progress Notes (Signed)
Nutrition Follow-up  DOCUMENTATION CODES:   Not applicable  INTERVENTION:   -Continue Glucerna Shake po QID, each supplement provides 220 kcal and 10 grams of protein -Continue MVI with minerals daily  NUTRITION DIAGNOSIS:   Moderate Malnutrition related to chronic illness (CAD, PAD) as evidenced by mild fat depletion, mild muscle depletion, percent weight loss.  Ongoing  GOAL:   Patient will meet greater than or equal to 90% of their needs  Progressing   MONITOR:   PO intake, Supplement acceptance, Weight trends, Labs, Skin, I & O's  REASON FOR ASSESSMENT:   Consult Diet education  ASSESSMENT:   59 y.o. male with medical history significant of CAD, culprit (RCA) and nonculprit (LCx) PCI after STEMI 07/2020, HFrEF, moderate MR, h/o cardiac arrest 2/2 Torsades post PPCI during index hospitalization, PAD with critical limb ischemia  11/10- s/p lt AKA  Reviewed I/O's: -121 ml x 24 hours and -8.9 L since 10/31/20  UOP: 2.7 L x 24 hours  Spoke with pt at bedside, who reports feeling a little better today. He expressed frustration over lengthy hospitalization but shares with this RD that he feels encouraged by the progress he has made in regards to amputation healing and participating in therapy.   Pt reports that food is still no appealing to him, but he tries to eat what he can. He is getting tired of the limited food options, however, agrees that intake has improved. Noted meal completion 25-100%. Pt still enjoys consuming Glucerna supplements and would like to continue with these.   Reinforced importance of good meal and supplement intake ti promote healing.   Medications reviewed and include miralax and senokot.   Per TOC notes, plan to d/c to SNF once medically stable.   Labs reviewed: CBGS: 136 (inpatient orders for glycemic control are 0-6 units insulin aspart every 4 hours).   Diet Order:   Diet Order            Diet Heart Room service appropriate? Yes; Fluid  consistency: Thin  Diet effective now                 EDUCATION NEEDS:   Education needs have been addressed  Skin:  Skin Assessment: Skin Integrity Issues: Skin Integrity Issues:: Other (Comment), Stage II Stage II: sacrum Incisions: s/p lt AKA Other: MASD to sacrum  Last BM:  11/14/20  Height:   Ht Readings from Last 1 Encounters:  10/31/20 6' (1.829 m)    Weight:   Wt Readings from Last 1 Encounters:  11/14/20 65.6 kg    Ideal Body Weight:  74.4 kg  BMI:  Body mass index is 19.61 kg/m.  Estimated Nutritional Needs:   Kcal:  2150-2350  Protein:  120-135 grams  Fluid:  > 2 L    Loistine Chance, RD, LDN, [place] Registered Dietitian II Certified Diabetes Care and Education Specialist Please refer to [facility] for RD and/or RD on-call/weekend/after hours pager

## 2020-11-14 NOTE — Progress Notes (Signed)
Foley was removed at [facility]

## 2020-11-14 NOTE — Progress Notes (Signed)
  Progress Note    11/14/2020 9:14 AM 8 Days Post-Op  Subjective:  Pain fairly well controlled. Appers comfortable this morning   Vitals:   11/14/20 0505 11/14/20 0732  BP: 132/64 (!) 101/59  Pulse: 81 (!) 57  Resp: 20 20  Temp: 99.6 F (37.6 C) 98.2 F (36.8 C)  SpO2: 98% 97%   Tmax 99.6 Physical Exam: Cardiac:  RRR Lungs:  nonlabored Incisions:  Well approximated Extremities:  Right foot warm. Intact motor and sensation. Left residual limb: Compression sock in place. Minimal edema. Flaps warm and well perfused. No signs of infection   CBC    Component Value Date/Time   WBC 8.7 11/14/2020 0236   RBC 2.54 (L) 11/14/2020 0236   HGB 7.1 (L) 11/14/2020 0236   HCT 22.9 (L) 11/14/2020 0236   PLT 242 11/14/2020 0236   MCV 90.2 11/14/2020 0236   MCV 85.0 03/16/2016 1642   MCH 28.0 11/14/2020 0236   MCHC 31.0 11/14/2020 0236   RDW 18.2 (H) 11/14/2020 0236   LYMPHSABS 1.2 11/14/2020 0236   MONOABS 0.5 11/14/2020 0236   EOSABS 0.5 11/14/2020 0236   BASOSABS 0.0 11/14/2020 0236    BMET    Component Value Date/Time   NA 141 11/14/2020 0236   NA 135 10/26/2020 1004   K 3.2 (L) 11/14/2020 0236   CL 112 (H) 11/14/2020 0236   CO2 22 11/14/2020 0236   GLUCOSE 96 11/14/2020 0236   BUN 18 11/14/2020 0236   BUN 79 (HH) 10/26/2020 1004   CREATININE 0.76 11/14/2020 0236   CALCIUM 8.0 (L) 11/14/2020 0236   GFRNONAA >60 11/14/2020 0236   GFRAA 12 (L) 10/26/2020 1004     Intake/Output Summary (Last 24 hours) at 11/14/2020 0914 Last data filed at 11/14/2020 0600 Gross per 24 hour  Intake 2409.18 ml  Output 2350 ml  Net 59.18 ml    HOSPITAL MEDICATIONS Scheduled Meds: . aspirin EC  81 mg Oral Daily  . clopidogrel  75 mg Oral Daily  . collagenase   Topical Daily  . feeding supplement (GLUCERNA SHAKE)  237 mL Oral QID  . gabapentin  300 mg Oral BID  . Gerhardt's butt cream   Topical BID  . insulin aspart  0-6 Units Subcutaneous Q4H  . multivitamin with minerals   1 tablet Oral Daily  . nicotine  7 mg Transdermal Q24H  . polyethylene glycol  17 g Oral Daily  . rosuvastatin  10 mg Oral Daily  . senna-docusate  2 tablet Oral BID  . sodium bicarbonate  1,300 mg Oral BID   Continuous Infusions: . sodium chloride 250 mL (11/12/20 1221)  . ceFEPime (MAXIPIME) IV 2 g (11/14/20 0347)  . magnesium sulfate bolus IVPB     PRN Meds:.sodium chloride, acetaminophen **OR** acetaminophen, nitroGLYCERIN, oxyCODONE  Assessment: BLE PAD: Critical limb ischemia of LLE with mottling and rest pain. POD 8 left AKA. Dr.  Rhodia Albright following right lower extremity.  Previous left iliac stent. Left foot ulceration>>no surgical revasc options due to prohibitive peri-operative risks (recent STEMI and PCI; EF 15%)  Plan: -We will make arrangements for post-op follow-up/staple removal as outpt   -On aspirin, Plavix and statin   Risa Grill, PA-C [facility] [phone] 11/14/2020  9:14 AM

## 2020-11-14 NOTE — Progress Notes (Addendum)
[facility]  PROGRESS NOTE  Mike [name] HFW:[id number] DOB: [birth year] DOA: 10/30/2020 PCP: Patient, No Pcp Per Admit date - 10/30/2020   Admitting Physician Mike Burow, MD  Outpatient Primary MD for the patient is Patient, No Pcp Per  LOS - 38 Brief Narrative   Mike [name] is a 59 year old male with medical history significant for STEMI (0/27/7412) complicated by cardiac arrest secondary to sides post PCI with chronic ST elevations in inferior leads since, severe bilateral renal artery stenosis, PAD with history of critical limb ischemia requiring right iliac revascularization of left lower extremity, chronic systolic CHF related to ischemic cardiomyopathy who presented on 11/2 with complaints of shortness of breath decrease ability to walk due to severe leg pain found to have critical limbischemia of left leg.  Patient has had ongoing issues with atherosclerotic vascular disease of the left leg but given his comorbidities (recent STEMI still requiring aspirin and Brilinta, CKD, depressed EF patient was too high risk for surgical revascularization per vascular surgery, but due to current hospitalization and further decompensation of symptoms after surgery elected to do above-knee amputation to deviate his rest pain.  Patient was transitioned from aspirin Brilinta aspirin and Plavix and underwent left AKA by Mike [name] 11/9.  Subjective   No acute issues or events overnight denies chest pain, shortness of breath, nausea, vomiting, diarrhea, constipation, headache, fevers, chills.  Reports ongoing mild to moderate pain at gluteal fold wound but moderately improving from previous.   A & P    Fever, suspect soft skin tissue infection of sacral ulcer site and questionable pneumonia, POA.   - Chest x-ray mentions possible opacity versus atelectasis, patient has no cough, no dyspnea, has downtrending leukocytosis.   - Decubitus ulcer of sacrum notable for open wounds that seem  consistent with soft skin tissue infection of gluteal fold given worsening pain on palpation and erythema this is likely source. - Started on 11/15 on vancomycin/cefepime given infection during hospital. - Transition to PO abx with Flagyl and Bactrim - Blood cultures remain negative, MRSA PCR negative, vancomycin discontinued - Expect may be able to titrate to oral coverage for gram-positive abdominal blood culture remain unremarkable 4 hours - Wound care consulted given sacral decubitus ulcer.    Sacral decubitus ulcer with wound in gluteal fold, POA - Wound care recommends applying Santyl to the gluteal fold and cover with saline moistened gauze and foam dressing, change daily - Continue antibiotics as above  Acute hospital-acquired delirium, resolved - Ammonia unremarkable, glucose unremarkable - Continue to closely monitor mental status - Delirium precautions ongoing  Asymptomatic Sinus Pauses.  - Noted on telemetry in setting of coreg and amiodarone.   - Suspect related to possible OSA - Monitor on telemetry - Coreg and corlanor Discontinued, appreciate cardiology recommendations - Goal K> 4, Mg> 2, replete as necessary  Abdominal pain, improved.  Suspect constipation related to opioid pain management.   - Resolved with BMs.  Abdominal x-ray nonacute.  Slight elevation of LFTs, chronically - Continue bowel regimen while on pain control with opioids - Continue monitor output  Severe PAD with gangrene of left leg with no revascularization options, status post left AKA due to gangrene and intolerable pain/rest pain below/9.  - Pain is much better controlled.  OFF PCA pump. Stump healing well  - Oxycontin discontinued - Oral oxycodone 5 mg IR, every 4 hours as needed moderate pain, scheduled gabapentin, daily MiraLAX, scheduled senna docusate - Vascular outpatient follow up w/Mike [name] in 4  weeks (his office will call), maintain staples for 4 weeks.   AKI on CKD stage III,  resolved.   - Occurred in setting of contrast received after peripheral arteriogram.  Creatinine back at baseline - Foley removed; follow for I's and O's, will replace Foley if trouble voiding - Avoid nephrotoxins  Chronic systolic failure in setting of ischemic cardiomyopathy (recent STEMI in August 2020) history of), euvolemic on exam -Coreg discontinued given sinus pause overnight -D/c entresto due to low BP -Monitor renal function, avoid dehydration/hypotension -Patient is DNR, cardiology does not recommend removing LifeVest or ICD given risk for infection -Previously discontinued Corlanor given pauses -Continue aspirin and Plavix -- crestor 10 mg  AKI on CKD stage III, resolved.  - Likely prerenal etiology in the setting of NSAIDs and contrast from arteriogram this hospital stay - Avoid nephrotoxins - Monitor BMP  Chronic hypotension,stable.  SBP stable ranging 90s-low 100s.   - entresto discontinued due to low BP - coreg discontinued due to sinus pauses  Mild anion gap metabolic acidosis likely in the setting of CKD now resolved. nadir of 11 - Continue sodium bicarb  Type 2 diabetes, A1c 7.1. glucose at goal - Sliding scale insulin as needed, monitor CBGs  Acute urinary retention. Suspect related to recent surgery and decreased mobility. UA unremarkable. In and out caht x2 for urine retention x 500 cc in 24 hours. Foley placed on 11/12 - Foley removed 11/17 - follow for voiding issues -if persists will need urology follow up for outpatient evaluation.  Normocytic anemia. Acutely worsened from postoperative anemia. Hgb has been stable at 7 since admission. No current signs or symptoms of bleeding. -on aspirin and plavix -monitor cbc, transfuse if hgb less than 7 -daily cbc   Family Communication  : None at bedside, sister updated at bedside previously Code Status : DNR Disposition Plan  :  Patient is from home. Anticipated d/c date: 2 to 3 days likely to SNF . Barriers  to d/c or necessity for inpatient status: Currently requiring IV antibiotics given soft skin tissue infection of sacral/gluteal wound, monitoring LFTs Consults  : Nephrology, vascular surgery, cardiology Procedures  :   DVT Prophylaxis  : Heparin  MDM: The below labs and imaging reports were reviewed and summarized above.  Medication management as above.  Lab Results  Component Value Date   PLT 242 11/14/2020    Diet :  Diet Order            Diet Heart Room service appropriate? Yes; Fluid consistency: Thin  Diet effective now                  Inpatient Medications Scheduled Meds: . aspirin EC  81 mg Oral Daily  . clopidogrel  75 mg Oral Daily  . collagenase   Topical Daily  . feeding supplement (GLUCERNA SHAKE)  237 mL Oral QID  . gabapentin  300 mg Oral BID  . Gerhardt's butt cream   Topical BID  . insulin aspart  0-6 Units Subcutaneous Q4H  . multivitamin with minerals  1 tablet Oral Daily  . nicotine  7 mg Transdermal Q24H  . polyethylene glycol  17 g Oral Daily  . rosuvastatin  10 mg Oral Daily  . senna-docusate  2 tablet Oral BID  . sodium bicarbonate  1,300 mg Oral BID   Continuous Infusions: . sodium chloride 250 mL (11/12/20 1221)  . ceFEPime (MAXIPIME) IV 2 g (11/14/20 0347)  . magnesium sulfate bolus IVPB  PRN Meds:.sodium chloride, acetaminophen **OR** acetaminophen, nitroGLYCERIN, oxyCODONE  Antibiotics  :   Anti-infectives (From admission, onward)   Start     Dose/Rate Route Frequency Ordered Stop   11/13/20 0200  vancomycin (VANCOREADY) IVPB 750 mg/150 mL  Status:  Discontinued        750 mg 150 mL/hr over 60 Minutes Intravenous Every 12 hours 11/12/20 1042 11/13/20 1411   11/12/20 1130  ceFEPIme (MAXIPIME) 2 g in sodium chloride 0.9 % 100 mL IVPB        2 g 200 mL/hr over 30 Minutes Intravenous Every 8 hours 11/12/20 1042     11/12/20 1130  vancomycin (VANCOREADY) IVPB 1250 mg/250 mL        1,250 mg 166.7 mL/hr over 90 Minutes Intravenous   Once 11/12/20 1042 11/13/20 0800   11/06/20 0930  ceFAZolin (ANCEF) IVPB 2g/100 mL premix  Status:  Discontinued        2 g 200 mL/hr over 30 Minutes Intravenous To Surgery 11/06/20 0922 11/06/20 1340       Objective   Vitals:   11/13/20 1106 11/13/20 2040 11/14/20 0006 11/14/20 0505  BP: (!) 118/52 118/61  132/64  Pulse: 71 77  81  Resp: 15 18  20   Temp: 98.3 F (36.8 C) 98.1 F (36.7 C)  99.6 F (37.6 C)  TempSrc: Oral Oral  Oral  SpO2: 99% 96%  98%  Weight:   65.6 kg   Height:        SpO2: 98 % O2 Flow Rate (L/min): 2 L/min  Wt Readings from Last 3 Encounters:  11/14/20 65.6 kg  10/26/20 69.7 kg  10/22/20 69.9 kg     Intake/Output Summary (Last 24 hours) at 11/14/2020 0743 Last data filed at 11/14/2020 0600 Gross per 24 hour  Intake 2529.18 ml  Output 2650 ml  Net -120.82 ml    Physical Exam:     Alert and oriented x4, normal affect No new F.N deficits,  [place].AT, Normal respiratory effort on room air, CTAB Abdomen soft, nontender, no rebound tenderness or guarding RRR,No Gallops,Rubs or new Murmurs, Left AKA with dressing in place, staples in place, wound healing well with no erythema or tenderness Erythema of sacrum extending to gluteal fold with ulcerations but no obvious drainage and stool present  Data Reviewed:  CBC Recent Labs  Lab 11/10/20 0449 11/11/20 0141 11/12/20 0601 11/13/20 0409 11/14/20 0236  WBC 10.0 12.3* 11.5* 11.1* 8.7  HGB 7.0* 7.8* 7.5* 7.6* 7.1*  HCT 22.3* 25.1* 23.8* 23.9* 22.9*  PLT 191 226 220 242 242  MCV 88.8 89.3 90.5 90.9 90.2  MCH 27.9 27.8 28.5 28.9 28.0  MCHC 31.4 31.1 31.5 31.8 31.0  RDW 18.5* 18.4* 18.2* 18.3* 18.2*  LYMPHSABS 1.2 0.7 0.8 1.2 1.2  MONOABS 0.7 0.8 0.8 0.7 0.5  EOSABS 0.4 0.3 0.4 0.6* 0.5  BASOSABS 0.0 0.0 0.0 0.0 0.0    Chemistries  Recent Labs  Lab 11/09/20 0343 11/09/20 0343 11/10/20 0544 11/11/20 0141 11/12/20 0601 11/12/20 1505 11/13/20 0409 11/14/20 0236  NA 137  --   --   137 139  --  141 141  K 3.7   < > 4.0 3.4* 3.8  --  3.5 3.2*  CL 105  --   --  104 107  --  109 112*  CO2 25  --   --  24 23  --  24 22  GLUCOSE 98  --   --  128* 111*  --  105*  96  BUN 37*  --   --  24* 22  --  23 18  CREATININE 1.12  --   --  1.06 1.13  --  0.89 0.76  CALCIUM 8.2*  --   --  8.3* 8.3*  --  8.3* 8.0*  MG  --   --  1.8 1.7  --   --   --   --   AST  --   --   --   --   --  55* 50* 47*  ALT  --   --   --   --   --  72* 75* 70*  ALKPHOS  --   --   --   --   --  51 55 54  BILITOT  --   --   --   --   --  0.6 0.6 0.5   < > = values in this interval not displayed.   ------------------------------------------------------------------------------------------------------------------ No results for input(s): CHOL, HDL, LDLCALC, TRIG, CHOLHDL, LDLDIRECT in the last 72 hours.  Lab Results  Component Value Date   HGBA1C 7.1 (H) 10/31/2020   ------------------------------------------------------------------------------------------------------------------ No results for input(s): TSH, T4TOTAL, T3FREE, THYROIDAB in the last 72 hours.  Invalid input(s): FREET3 ------------------------------------------------------------------------------------------------------------------ No results for input(s): VITAMINB12, FOLATE, FERRITIN, TIBC, IRON, RETICCTPCT in the last 72 hours.  Coagulation profile No results for input(s): INR, PROTIME in the last 168 hours.  No results for input(s): DDIMER in the last 72 hours.  Cardiac Enzymes No results for input(s): CKMB, TROPONINI, MYOGLOBIN in the last 168 hours.  Invalid input(s): CK ------------------------------------------------------------------------------------------------------------------    Component Value Date/Time   BNP 488.4 (H) 10/30/2020 1820    Micro Results Recent Results (from the past 240 hour(s))  Surgical PCR screen     Status: None   Collection Time: 11/06/20  2:40 AM   Specimen: Nasal Mucosa; Nasal Swab  Result  Value Ref Range Status   MRSA, PCR NEGATIVE NEGATIVE Final   Staphylococcus aureus NEGATIVE NEGATIVE Final    Comment: (NOTE) The Xpert SA Assay (FDA approved for NASAL specimens in patients 63 years of age and older), is one component of a comprehensive surveillance program. It is not intended to diagnose infection nor to guide or monitor treatment. Performed at [facility], [place] [address] [place], [place] [address]   Culture, blood (routine x 2)     Status: None (Preliminary result)   Collection Time: 11/11/20  8:23 AM   Specimen: BLOOD  Result Value Ref Range Status   Specimen Description BLOOD RIGHT ANTECUBITAL  Final   Special Requests   Final    BOTTLES DRAWN AEROBIC AND ANAEROBIC Blood Culture results may not be optimal due to an inadequate volume of blood received in culture bottles   Culture   Final    NO GROWTH 2 DAYS Performed at [facility], [place] [address] [place], [place] [address]    Report Status PENDING  Incomplete  Culture, blood (routine x 2)     Status: None (Preliminary result)   Collection Time: 11/11/20  8:28 AM   Specimen: BLOOD  Result Value Ref Range Status   Specimen Description BLOOD LEFT ANTECUBITAL  Final   Special Requests   Final    BOTTLES DRAWN AEROBIC ONLY Blood Culture results may not be optimal due to an inadequate volume of blood received in culture bottles   Culture   Final    NO GROWTH 2 DAYS Performed at [facility], [place] [address] [place], [place] [address]    Report Status PENDING  Incomplete  Culture, Urine     Status: None   Collection Time: 11/11/20  8:35 AM   Specimen: Urine, Catheterized  Result Value Ref Range Status   Specimen Description URINE, CATHETERIZED  Final   Special Requests NONE  Final   Culture   Final    NO GROWTH Performed at [facility], [address] [place], [place] [address]    Report Status 11/12/2020 FINAL  Final  SARS Coronavirus 2 by RT PCR (hospital order, performed in  [facility] hospital lab) Nasopharyngeal Nasopharyngeal Swab     Status: None   Collection Time: 11/12/20 10:19 AM   Specimen: Nasopharyngeal Swab  Result Value Ref Range Status   SARS Coronavirus 2 NEGATIVE NEGATIVE Final    Comment: (NOTE) SARS-CoV-2 target nucleic acids are NOT DETECTED.  The SARS-CoV-2 RNA is generally detectable in upper and lower respiratory specimens during the acute phase of infection. The lowest concentration of SARS-CoV-2 viral copies this assay can detect is 250 copies / mL. A negative result does not preclude SARS-CoV-2 infection and should not be used as the sole basis for treatment or other patient management decisions.  A negative result may occur with improper specimen collection / handling, submission of specimen other than nasopharyngeal swab, presence of viral mutation(s) within the areas targeted by this assay, and inadequate number of viral copies (<250 copies / mL). A negative result must be combined with clinical observations, patient history, and epidemiological information.  Fact Sheet for Patients:   StrictlyIdeas.no  Fact Sheet for Healthcare Providers: BankingDealers.co.za  This test is not yet approved or  cleared by the [place] FDA and has been authorized for detection and/or diagnosis of SARS-CoV-2 by FDA under an Emergency Use Authorization (EUA).  This EUA will remain in effect (meaning this test can be used) for the duration of the COVID-19 declaration under Section 564(b)(1) of the Act, 21 U.S.C. section 360bbb-3(b)(1), unless the authorization is terminated or revoked sooner.  Performed at [facility], [place] [address] [place], [place] [address]     Radiology Reports US RENAL  Result Date: 11/08/2020 CLINICAL DATA:  Urinary retention EXAM: RENAL / URINARY TRACT ULTRASOUND COMPLETE COMPARISON:  10/17/2020 FINDINGS: Right Kidney: Renal measurements: 11.8 x 5.4 x 5.8  cm = volume: 194 mL. Echogenicity within normal limits. No mass or hydronephrosis visualized. Left Kidney: Renal measurements: 10.4 x 4.7 x 4.6 cm = volume: 119 mL. Echogenicity within normal limits. Incidental simple cyst measuring 1.5 cm. No mass or hydronephrosis visualized. Bladder: Prevoid urinary bladder volume 635 mL. Patient is unable to void at the time of examination. Other: None. IMPRESSION: 1. No hydronephrosis. 2. Prevoid urinary bladder volume 635 mL. Patient is unable to void at the time of examination. Nursing staff notified by sonographer. Electronically Signed   By: Eddie Candle M.D.   On: 11/08/2020 18:29   US RENAL  Result Date: 10/17/2020 CLINICAL DATA:  Acute kidney injury EXAM: RENAL / URINARY TRACT ULTRASOUND COMPLETE COMPARISON:  None. FINDINGS: Right Kidney: Renal measurements: 11.8 x 5.6 x 5.7 cm = volume: 197.9 mL. Echogenicity and renal cortical thickness are within normal limits. No mass, perinephric fluid, or hydronephrosis visualized. No sonographically demonstrable calculus or ureterectasis. Left Kidney: Renal measurements: 10.7 x 4.7 x 4.1 cm = volume: 109.5 mL. Echogenicity and renal cortical thickness are within normal limits. No perinephric fluid or hydronephrosis visualized. There  is a cyst arising from the lower pole left kidney measuring 1.8 x 1.4 x 1.5 cm. No sonographically demonstrable calculus or ureterectasis. Bladder: Appears normal for degree of bladder distention. Other: None. IMPRESSION: Cyst arising from lower pole left kidney measuring 1.8 x 1.4 x 1.5 cm. Study otherwise unremarkable. Electronically Signed   By: Lowella Grip III M.D.   On: 10/17/2020 15:28   PERIPHERAL VASCULAR CATHETERIZATION  Result Date: 10/16/2020 Successful intravascular lithotripsy, PTCA and stenting     7.0X39 mm balloon expandable Viabahn VBX stent Rt common iliac artery     7.0X60 mm self expanding Absolute Pro stent Rt distal iliac artery No named vessels at ankle level in  both lower extremities. Will consult vascular surgery for Rt fem-to-left profunda bypass given left foot critical limb ischemia. Nigel Mormon, MD Pager: [phone] Office: [phone]   DG CHEST PORT 1 VIEW  Result Date: 11/11/2020 CLINICAL DATA:  Fever and chest pain. EXAM: PORTABLE CHEST 1 VIEW COMPARISON:  October 30, 2020 FINDINGS: Cardiomediastinal silhouette is normal. Mediastinal contours appear intact. Mild streaky airspace opacities in the lung bases. Osseous structures are without acute abnormality. Soft tissues are grossly normal. IMPRESSION: Mild streaky airspace opacities in the lung bases may represent atelectasis or atypical/viral pneumonia. Electronically Signed   By: Fidela Salisbury M.D.   On: 11/11/2020 10:28   DG Chest Portable 1 View  Result Date: 10/30/2020 CLINICAL DATA:  Chest pain EXAM: PORTABLE CHEST 1 VIEW COMPARISON:  08/03/2020 FINDINGS: Single frontal view of the chest demonstrates stenting artifact from external cardiac device. Cardiac silhouette is unremarkable. No airspace disease, effusion, or pneumothorax. No acute bony abnormalities. IMPRESSION: 1. No acute intrathoracic process. Electronically Signed   By: Randa Ngo M.D.   On: 10/30/2020 17:01   DG Abd Portable 1V  Result Date: 11/12/2020 CLINICAL DATA:  Abdominal pain EXAM: PORTABLE ABDOMEN - 1 VIEW COMPARISON:  None. FINDINGS: Gas in nondilated stomach and colon. No bowel obstruction. There is stool and gas in the rectum. No bowel wall edema. Negative for urinary tract calcifications. No skeletal abnormality. Right iliac stents noted. IMPRESSION: Normal bowel gas pattern. Electronically Signed   By: Franchot Gallo M.D.   On: 11/12/2020 18:00   VAS US CAROTID  Result Date: 10/17/2020 Carotid Arterial Duplex Study Indications:  Pre-surgical evaluation. Risk Factors: Hypertension, hyperlipidemia, current smoker, coronary artery               disease, PAD. Performing Technologist: Maudry Mayhew MHA, RDMS, RVT, RDCS  Examination Guidelines: A complete evaluation includes B-mode imaging, spectral Doppler, color Doppler, and power Doppler as needed of all accessible portions of each vessel. Bilateral testing is considered an integral part of a complete examination. Limited examinations for reoccurring indications may be performed as noted.  Right Carotid Findings: +----------+--------+--------+--------+-----------------------+--------+           PSV cm/sEDV cm/sStenosisPlaque Description     Comments +----------+--------+--------+--------+-----------------------+--------+ CCA Prox  186                     smooth and heterogenous         +----------+--------+--------+--------+-----------------------+--------+ CCA Distal87      16              smooth and heterogenous         +----------+--------+--------+--------+-----------------------+--------+ ICA Prox  56      13              smooth and heterogenous         +----------+--------+--------+--------+-----------------------+--------+  ICA Distal70      15                                              +----------+--------+--------+--------+-----------------------+--------+ ECA       139     7               smooth and heterogenous         +----------+--------+--------+--------+-----------------------+--------+ +----------+--------+-------+----------------+-------------------+           PSV cm/sEDV cmsDescribe        Arm Pressure (mmHG) +----------+--------+-------+----------------+-------------------+ [id number]            Multiphasic, WNL                    +----------+--------+-------+----------------+-------------------+ +---------+--------+--+--------+--+---------+ VertebralPSV cm/s78EDV cm/s29Antegrade +---------+--------+--+--------+--+---------+  Left Carotid Findings: +----------+--------+--------+--------+-----------------------+--------+           PSV cm/sEDV cm/sStenosisPlaque  Description     Comments +----------+--------+--------+--------+-----------------------+--------+ CCA Prox  207     10                                              +----------+--------+--------+--------+-----------------------+--------+ CCA [id number]     14              smooth and heterogenous         +----------+--------+--------+--------+-----------------------+--------+ ICA Prox  81      17              smooth and heterogenous         +----------+--------+--------+--------+-----------------------+--------+ ICA Distal109     29                                              +----------+--------+--------+--------+-----------------------+--------+ ECA       240     14              smooth and heterogenous         +----------+--------+--------+--------+-----------------------+--------+ +----------+--------+--------+--------+-------------------+           PSV cm/sEDV cm/sDescribeArm Pressure (mmHG) +----------+--------+--------+--------+-------------------+ Subclavian310                                         +----------+--------+--------+--------+-------------------+ +---------+--------+---+--------+--+---------------------------------+ VertebralPSV cm/s293EDV cm/s47>50% cervical stenosis, antegrade +---------+--------+---+--------+--+---------------------------------+   Summary: Right Carotid: Velocities in the right ICA are consistent with a 1-39% stenosis. Left Carotid: Velocities in the left ICA are consistent with a 1-39% stenosis. Vertebrals:  Bilateral vertebral arteries demonstrate antegrade flow. Left >50%              cervical stenosis. Subclavians: Normal flow hemodynamics were seen in bilateral subclavian              arteries. *See table(s) above for measurements and observations.  Electronically signed by Harold Barban MD on 10/17/2020 at 7:38:49 PM.    Final      Time Spent in minutes: [place] DO on 11/14/2020 at 7:43  AM

## 2020-11-14 NOTE — Plan of Care (Signed)
  Problem: Education: Goal: Knowledge of General Education information will improve Description Including pain rating scale, medication(s)/side effects and non-pharmacologic comfort measures Outcome: Progressing   

## 2020-11-14 NOTE — Progress Notes (Signed)
ABI has been completed.   Preliminary results in CV Proc.   Mike [name] 11/14/2020 9:00 AM

## 2020-11-15 DIAGNOSIS — Z66 Do not resuscitate: Secondary | ICD-10-CM | POA: Diagnosis not present

## 2020-11-15 DIAGNOSIS — Z8249 Family history of ischemic heart disease and other diseases of the circulatory system: Secondary | ICD-10-CM | POA: Diagnosis not present

## 2020-11-15 DIAGNOSIS — I959 Hypotension, unspecified: Secondary | ICD-10-CM | POA: Diagnosis not present

## 2020-11-15 DIAGNOSIS — R739 Hyperglycemia, unspecified: Secondary | ICD-10-CM | POA: Diagnosis not present

## 2020-11-15 DIAGNOSIS — Z955 Presence of coronary angioplasty implant and graft: Secondary | ICD-10-CM | POA: Diagnosis not present

## 2020-11-15 DIAGNOSIS — I70229 Atherosclerosis of native arteries of extremities with rest pain, unspecified extremity: Secondary | ICD-10-CM | POA: Diagnosis not present

## 2020-11-15 DIAGNOSIS — L309 Dermatitis, unspecified: Secondary | ICD-10-CM | POA: Diagnosis not present

## 2020-11-15 DIAGNOSIS — I280 Arteriovenous fistula of pulmonary vessels: Secondary | ICD-10-CM | POA: Diagnosis not present

## 2020-11-15 DIAGNOSIS — Z7401 Bed confinement status: Secondary | ICD-10-CM | POA: Diagnosis not present

## 2020-11-15 DIAGNOSIS — R52 Pain, unspecified: Secondary | ICD-10-CM | POA: Diagnosis not present

## 2020-11-15 DIAGNOSIS — R29810 Facial weakness: Secondary | ICD-10-CM | POA: Diagnosis not present

## 2020-11-15 DIAGNOSIS — R21 Rash and other nonspecific skin eruption: Secondary | ICD-10-CM | POA: Diagnosis not present

## 2020-11-15 DIAGNOSIS — Z87891 Personal history of nicotine dependence: Secondary | ICD-10-CM | POA: Diagnosis not present

## 2020-11-15 DIAGNOSIS — E1165 Type 2 diabetes mellitus with hyperglycemia: Secondary | ICD-10-CM | POA: Diagnosis not present

## 2020-11-15 DIAGNOSIS — I6789 Other cerebrovascular disease: Secondary | ICD-10-CM | POA: Diagnosis not present

## 2020-11-15 DIAGNOSIS — R4781 Slurred speech: Secondary | ICD-10-CM | POA: Diagnosis not present

## 2020-11-15 DIAGNOSIS — I6523 Occlusion and stenosis of bilateral carotid arteries: Secondary | ICD-10-CM | POA: Diagnosis not present

## 2020-11-15 DIAGNOSIS — R2689 Other abnormalities of gait and mobility: Secondary | ICD-10-CM | POA: Diagnosis not present

## 2020-11-15 DIAGNOSIS — R4182 Altered mental status, unspecified: Secondary | ICD-10-CM | POA: Diagnosis not present

## 2020-11-15 DIAGNOSIS — Z833 Family history of diabetes mellitus: Secondary | ICD-10-CM | POA: Diagnosis not present

## 2020-11-15 DIAGNOSIS — L89323 Pressure ulcer of left buttock, stage 3: Secondary | ICD-10-CM | POA: Diagnosis not present

## 2020-11-15 DIAGNOSIS — R29705 NIHSS score 5: Secondary | ICD-10-CM | POA: Diagnosis not present

## 2020-11-15 DIAGNOSIS — R339 Retention of urine, unspecified: Secondary | ICD-10-CM | POA: Diagnosis not present

## 2020-11-15 DIAGNOSIS — Z20822 Contact with and (suspected) exposure to covid-19: Secondary | ICD-10-CM | POA: Diagnosis not present

## 2020-11-15 DIAGNOSIS — I110 Hypertensive heart disease with heart failure: Secondary | ICD-10-CM | POA: Diagnosis not present

## 2020-11-15 DIAGNOSIS — M2550 Pain in unspecified joint: Secondary | ICD-10-CM | POA: Diagnosis not present

## 2020-11-15 DIAGNOSIS — R29818 Other symptoms and signs involving the nervous system: Secondary | ICD-10-CM | POA: Diagnosis not present

## 2020-11-15 DIAGNOSIS — I2510 Atherosclerotic heart disease of native coronary artery without angina pectoris: Secondary | ICD-10-CM | POA: Diagnosis not present

## 2020-11-15 DIAGNOSIS — I5042 Chronic combined systolic (congestive) and diastolic (congestive) heart failure: Secondary | ICD-10-CM | POA: Diagnosis not present

## 2020-11-15 DIAGNOSIS — I63411 Cerebral infarction due to embolism of right middle cerebral artery: Secondary | ICD-10-CM | POA: Diagnosis not present

## 2020-11-15 DIAGNOSIS — I255 Ischemic cardiomyopathy: Secondary | ICD-10-CM | POA: Diagnosis not present

## 2020-11-15 DIAGNOSIS — I739 Peripheral vascular disease, unspecified: Secondary | ICD-10-CM | POA: Diagnosis not present

## 2020-11-15 DIAGNOSIS — Z4781 Encounter for orthopedic aftercare following surgical amputation: Secondary | ICD-10-CM | POA: Diagnosis not present

## 2020-11-15 DIAGNOSIS — Z720 Tobacco use: Secondary | ICD-10-CM | POA: Diagnosis not present

## 2020-11-15 DIAGNOSIS — G8191 Hemiplegia, unspecified affecting right dominant side: Secondary | ICD-10-CM | POA: Diagnosis not present

## 2020-11-15 DIAGNOSIS — Q211 Atrial septal defect: Secondary | ICD-10-CM | POA: Diagnosis not present

## 2020-11-15 DIAGNOSIS — R000 Tachycardia, unspecified: Secondary | ICD-10-CM | POA: Diagnosis not present

## 2020-11-15 DIAGNOSIS — I639 Cerebral infarction, unspecified: Secondary | ICD-10-CM | POA: Diagnosis present

## 2020-11-15 DIAGNOSIS — E782 Mixed hyperlipidemia: Secondary | ICD-10-CM | POA: Diagnosis not present

## 2020-11-15 DIAGNOSIS — I429 Cardiomyopathy, unspecified: Secondary | ICD-10-CM | POA: Diagnosis not present

## 2020-11-15 DIAGNOSIS — I6340 Cerebral infarction due to embolism of unspecified cerebral artery: Secondary | ICD-10-CM | POA: Diagnosis not present

## 2020-11-15 DIAGNOSIS — I25119 Atherosclerotic heart disease of native coronary artery with unspecified angina pectoris: Secondary | ICD-10-CM | POA: Diagnosis not present

## 2020-11-15 DIAGNOSIS — S78112A Complete traumatic amputation at level between left hip and knee, initial encounter: Secondary | ICD-10-CM | POA: Diagnosis not present

## 2020-11-15 DIAGNOSIS — R338 Other retention of urine: Secondary | ICD-10-CM | POA: Diagnosis not present

## 2020-11-15 DIAGNOSIS — I6330 Cerebral infarction due to thrombosis of unspecified cerebral artery: Secondary | ICD-10-CM | POA: Diagnosis not present

## 2020-11-15 DIAGNOSIS — R079 Chest pain, unspecified: Secondary | ICD-10-CM | POA: Diagnosis not present

## 2020-11-15 DIAGNOSIS — M6281 Muscle weakness (generalized): Secondary | ICD-10-CM | POA: Diagnosis not present

## 2020-11-15 DIAGNOSIS — X58XXXA Exposure to other specified factors, initial encounter: Secondary | ICD-10-CM | POA: Diagnosis not present

## 2020-11-15 DIAGNOSIS — Z89612 Acquired absence of left leg above knee: Secondary | ICD-10-CM | POA: Diagnosis not present

## 2020-11-15 DIAGNOSIS — E785 Hyperlipidemia, unspecified: Secondary | ICD-10-CM | POA: Diagnosis not present

## 2020-11-15 DIAGNOSIS — Z9889 Other specified postprocedural states: Secondary | ICD-10-CM | POA: Diagnosis not present

## 2020-11-15 DIAGNOSIS — G8194 Hemiplegia, unspecified affecting left nondominant side: Secondary | ICD-10-CM | POA: Diagnosis not present

## 2020-11-15 DIAGNOSIS — I6389 Other cerebral infarction: Secondary | ICD-10-CM | POA: Diagnosis not present

## 2020-11-15 DIAGNOSIS — I672 Cerebral atherosclerosis: Secondary | ICD-10-CM | POA: Diagnosis not present

## 2020-11-15 DIAGNOSIS — I701 Atherosclerosis of renal artery: Secondary | ICD-10-CM | POA: Diagnosis not present

## 2020-11-15 DIAGNOSIS — R601 Generalized edema: Secondary | ICD-10-CM | POA: Diagnosis not present

## 2020-11-15 DIAGNOSIS — F17200 Nicotine dependence, unspecified, uncomplicated: Secondary | ICD-10-CM | POA: Diagnosis not present

## 2020-11-15 DIAGNOSIS — N401 Enlarged prostate with lower urinary tract symptoms: Secondary | ICD-10-CM | POA: Diagnosis not present

## 2020-11-15 DIAGNOSIS — I5022 Chronic systolic (congestive) heart failure: Secondary | ICD-10-CM | POA: Diagnosis not present

## 2020-11-15 DIAGNOSIS — I6502 Occlusion and stenosis of left vertebral artery: Secondary | ICD-10-CM | POA: Diagnosis not present

## 2020-11-15 DIAGNOSIS — I6621 Occlusion and stenosis of right posterior cerebral artery: Secondary | ICD-10-CM | POA: Diagnosis not present

## 2020-11-15 DIAGNOSIS — Z8674 Personal history of sudden cardiac arrest: Secondary | ICD-10-CM | POA: Diagnosis not present

## 2020-11-15 DIAGNOSIS — F17210 Nicotine dependence, cigarettes, uncomplicated: Secondary | ICD-10-CM | POA: Diagnosis present

## 2020-11-15 DIAGNOSIS — T8131XA Disruption of external operation (surgical) wound, not elsewhere classified, initial encounter: Secondary | ICD-10-CM | POA: Diagnosis not present

## 2020-11-15 DIAGNOSIS — G546 Phantom limb syndrome with pain: Secondary | ICD-10-CM | POA: Diagnosis not present

## 2020-11-15 DIAGNOSIS — Z23 Encounter for immunization: Secondary | ICD-10-CM | POA: Diagnosis not present

## 2020-11-15 DIAGNOSIS — I252 Old myocardial infarction: Secondary | ICD-10-CM | POA: Diagnosis not present

## 2020-11-15 DIAGNOSIS — Z8673 Personal history of transient ischemic attack (TIA), and cerebral infarction without residual deficits: Secondary | ICD-10-CM | POA: Diagnosis not present

## 2020-11-15 DIAGNOSIS — R270 Ataxia, unspecified: Secondary | ICD-10-CM | POA: Diagnosis not present

## 2020-11-15 DIAGNOSIS — R414 Neurologic neglect syndrome: Secondary | ICD-10-CM | POA: Diagnosis not present

## 2020-11-15 LAB — CBC WITH DIFFERENTIAL/PLATELET
Abs Immature Granulocytes: 0.06 10*3/uL (ref 0.00–0.07)
Basophils Absolute: 0 10*3/uL (ref 0.0–0.1)
Basophils Relative: 0 %
Eosinophils Absolute: 0.5 10*3/uL (ref 0.0–0.5)
Eosinophils Relative: 5 %
HCT: 22.2 % — ABNORMAL LOW (ref 39.0–52.0)
Hemoglobin: 7 g/dL — ABNORMAL LOW (ref 13.0–17.0)
Immature Granulocytes: 1 %
Lymphocytes Relative: 15 %
Lymphs Abs: 1.4 10*3/uL (ref 0.7–4.0)
MCH: 28.5 pg (ref 26.0–34.0)
MCHC: 31.5 g/dL (ref 30.0–36.0)
MCV: 90.2 fL (ref 80.0–100.0)
Monocytes Absolute: 0.6 10*3/uL (ref 0.1–1.0)
Monocytes Relative: 7 %
Neutro Abs: 6.7 10*3/uL (ref 1.7–7.7)
Neutrophils Relative %: 72 %
Platelets: 269 10*3/uL (ref 150–400)
RBC: 2.46 MIL/uL — ABNORMAL LOW (ref 4.22–5.81)
RDW: 18 % — ABNORMAL HIGH (ref 11.5–15.5)
WBC: 9.3 10*3/uL (ref 4.0–10.5)
nRBC: 0 % (ref 0.0–0.2)

## 2020-11-15 LAB — GLUCOSE, CAPILLARY
Glucose-Capillary: 106 mg/dL — ABNORMAL HIGH (ref 70–99)
Glucose-Capillary: 110 mg/dL — ABNORMAL HIGH (ref 70–99)
Glucose-Capillary: 88 mg/dL (ref 70–99)
Glucose-Capillary: 90 mg/dL (ref 70–99)

## 2020-11-15 LAB — COMPREHENSIVE METABOLIC PANEL
ALT: 61 U/L — ABNORMAL HIGH (ref 0–44)
AST: 37 U/L (ref 15–41)
Albumin: 1.8 g/dL — ABNORMAL LOW (ref 3.5–5.0)
Alkaline Phosphatase: 62 U/L (ref 38–126)
Anion gap: 7 (ref 5–15)
BUN: 21 mg/dL (ref 8–23)
CO2: 23 mmol/L (ref 22–32)
Calcium: 7.8 mg/dL — ABNORMAL LOW (ref 8.9–10.3)
Chloride: 110 mmol/L (ref 98–111)
Creatinine, Ser: 0.77 mg/dL (ref 0.61–1.24)
GFR, Estimated: >60 mL/min (ref >60)
Glucose, Bld: 99 mg/dL (ref 70–99)
Potassium: 3.3 mmol/L — ABNORMAL LOW (ref 3.5–5.1)
Sodium: 140 mmol/L (ref 135–145)
Total Bilirubin: 0.4 mg/dL (ref 0.3–1.2)
Total Protein: 5 g/dL — ABNORMAL LOW (ref 6.5–8.1)

## 2020-11-15 MED ORDER — GABAPENTIN 300 MG PO CAPS: 300.0000 mg | ORAL_CAPSULE | Freq: Two times a day (BID) | ORAL | 0 refills | Status: AC

## 2020-11-15 MED ORDER — SULFAMETHOXAZOLE-TRIMETHOPRIM 800-160 MG PO TABS
1.0000 | ORAL_TABLET | Freq: Two times a day (BID) | ORAL | 0 refills | Status: DC
Start: 2020-11-15 — End: 2020-12-11

## 2020-11-15 MED ORDER — OXYCODONE HCL 5 MG PO TABS: 5.0000 mg | ORAL_TABLET | Freq: Four times a day (QID) | ORAL | 0 refills | Status: DC | PRN

## 2020-11-15 MED ORDER — COLLAGENASE 250 UNIT/GM EX OINT
TOPICAL_OINTMENT | Freq: Every day | CUTANEOUS | 0 refills | Status: DC
Start: 2020-11-15 — End: 2020-12-11

## 2020-11-15 MED ORDER — OXYCODONE HCL 5 MG PO TABS: 5.0000 mg | ORAL_TABLET | Freq: Four times a day (QID) | ORAL | 0 refills | Status: AC | PRN

## 2020-11-15 MED ORDER — METRONIDAZOLE 500 MG PO TABS
500.0000 mg | ORAL_TABLET | Freq: Three times a day (TID) | ORAL | 0 refills | Status: DC
Start: 2020-11-15 — End: 2020-12-11

## 2020-11-15 MED ORDER — GERHARDT'S BUTT CREAM: 1.0000 "application " | TOPICAL_CREAM | Freq: Two times a day (BID) | CUTANEOUS | 0 refills | Status: DC

## 2020-11-15 MED ORDER — ROSUVASTATIN CALCIUM 10 MG PO TABS
10.0000 mg | ORAL_TABLET | Freq: Every day | ORAL | 0 refills | Status: DC
Start: 2020-11-15 — End: 2021-01-08

## 2020-11-15 MED ORDER — CLOPIDOGREL BISULFATE 75 MG PO TABS
75.0000 mg | ORAL_TABLET | Freq: Every day | ORAL | 0 refills | Status: DC
Start: 2020-11-15 — End: 2021-01-08

## 2020-11-15 NOTE — Progress Notes (Signed)
Pt Foley was removed on 11/17 at 17:30 and he has not voided bladder scan showed 309 ml and he is refusing I&O cath. Dr. Farrel Conners.

## 2020-11-15 NOTE — Progress Notes (Signed)
Physical Therapy Treatment Patient Details Name: Mike [name] MRN: [id number] DOB: [birth year] Today's Date: 11/15/2020    History of Present Illness 59 year old man admitted on 10/30/20 with SOB and inability to walk with severe L LE pain. Pt with critical L LE ischemia. Underwent L AKA 11/06/20. PMH: STEMI 07/2020 with moderate MR due to torsade post PCI, L LE ischemia with R iliac revascularization, CKD IV, CHF, DM.     PT Comments    Pt supine in bed on arrival this session.  He is agreeable to PT session.  Required min assistance to come to sitting and mod assistance to achieve standing.  He does report dizziness in sitting.  Performed BP assessment in sitting and standing and no signs of orthostasis.  Pt reports urge to have BM so sat down on bed pan.  This position was not helpful to complete BM so moved from bed to bedside commode where he was left in care of NT.  Plan for SNF remains appropriate.      Follow Up Recommendations  SNF     Equipment Recommendations  Other (comment) (TBA at next LOC.)    Recommendations for Other Services Rehab consult     Precautions / Restrictions Precautions Precautions: Fall Precaution Comments: sacral wound, mod fall Restrictions Weight Bearing Restrictions: Yes LLE Weight Bearing: Non weight bearing    Mobility  Bed Mobility Overal bed mobility: Needs Assistance Bed Mobility: Supine to Sit     Supine to sit: Min assist     General bed mobility comments: Pt able to advance LEs to edge of bed with cues to hook RLE on edge of bed to aide in trunk elevation.  Transfers Overall transfer level: Needs assistance Equipment used: Rolling walker (2 wheeled) Transfers: Sit to/from [facility] Sit to Stand: Mod assist Stand pivot transfers: Mod assist       General transfer comment: Pt performed several sit to stand transfers, Pt with high urgency to have BM.  Attempted sitting on bed pan but could not get  comfortable.  Pt then moved from bed to bed side commode.  Once on commode patient wanting to sit longer to complete BM.  Pt left with NT present.  Ambulation/Gait Ambulation/Gait assistance:  (NT due to need for BM.)               Stairs             Wheelchair Mobility    Modified Rankin (Stroke Patients Only)       Balance Overall balance assessment: Needs assistance   Sitting balance-Leahy Scale: Good       Standing balance-Leahy Scale: Poor Standing balance comment: Heavy use of RW and external assistance.                            Cognition Arousal/Alertness: Awake/alert Behavior During Therapy: WFL for tasks assessed/performed Overall Cognitive Status: Within Functional Limits for tasks assessed Area of Impairment: Following commands;Problem solving;Safety/judgement                       Following Commands: Follows one step commands with increased time Safety/Judgement: Decreased awareness of safety;Decreased awareness of deficits   Problem Solving: Requires verbal cues;Requires tactile cues        Exercises      General Comments        Pertinent Vitals/Pain Pain Assessment: Faces Faces Pain Scale: Hurts whole  lot Pain Location: abdominal cramping/pain while having bowel movement at edge of bed. Pain Descriptors / Indicators: Grimacing;Moaning Pain Intervention(s): Repositioned    Home Living                      Prior Function            PT Goals (current goals can now be found in the care plan section) Acute Rehab PT Goals Patient Stated Goal: to go to rehab then home Potential to Achieve Goals: Good Progress towards PT goals: Progressing toward goals    Frequency    Min 2X/week      PT Plan Current plan remains appropriate    Co-evaluation              AM-PAC PT "6 Clicks" Mobility   Outcome Measure  Help needed turning from your back to your side while in a flat bed without using  bedrails?: None Help needed moving from lying on your back to sitting on the side of a flat bed without using bedrails?: A Little Help needed moving to and from a bed to a chair (including a wheelchair)?: A Lot Help needed standing up from a chair using your arms (e.g., wheelchair or bedside chair)?: A Lot Help needed to walk in hospital room?: A Lot Help needed climbing 3-5 steps with a railing? : A Lot 6 Click Score: 15    End of Session Equipment Utilized During Treatment: Gait belt Activity Tolerance: Patient tolerated treatment well;Other (comment) (Pt limited due to need for BM and seated on commode for increased time.) Patient left: with nursing/sitter in room (sitting on BSC at side of bed.) Nurse Communication: Mobility status PT Visit Diagnosis: Pain;Difficulty in walking, not elsewhere classified (R26.2);Other abnormalities of gait and mobility (R26.89);Unsteadiness on feet (R26.81);Muscle weakness (generalized) (M62.81) Pain - Right/Left: Left Pain - part of body: Leg     Time: 0174-9449 PT Time Calculation (min) (ACUTE ONLY): 25 min  Charges:  $Therapeutic Activity: 23-37 mins                     Erasmo Leventhal , PTA Acute Rehabilitation Services Pager [phone] Office [phone]     Micki Cassel Eli Hose 11/15/2020, 10:29 AM

## 2020-11-15 NOTE — Progress Notes (Signed)
Pt alert and oriented, no shortness of breath and no pain. Surgical site clean, dry and intact. Discharge paperwork reviewed and belongings checked. Report given to receiving facility, pt all set for discharge.

## 2020-11-15 NOTE — Progress Notes (Signed)
Foley catheter placed due to urinary retention per order.

## 2020-11-15 NOTE — TOC Transition Note (Signed)
Transition of Care [facility]) - CM/SW Discharge Note   Patient Details  Name: Mike [name] MRN: [id number] Date of Birth: 1961-01-01  Transition of Care [facility] CM/SW Contact:  [address]ta [address], [place] Phone Number: 11/15/2020, 8:54 AM   Clinical Narrative:    Patient will DC to: [place] date: 11/15/20 Family notified: Kandyce Rud Transport by: Corey Harold   Per MD patient ready for DC to [facility]. RN to call report prior to discharge [phone], room 119A. RN, patient, patient's family, and facility notified of DC. Discharge Summary and FL2 sent to facility. DC packet on chart. Ambulance transport requested for patient.   CSW will sign off for now as social work intervention is no longer needed. Please consult [place] again if new needs arise.     Final next level of care: Skilled Nursing Facility Barriers to Discharge: Barriers Resolved   Patient Goals and CMS Choice Patient states their goals for this hospitalization and ongoing recovery are:: to go to SNF CMS Medicare.gov Compare Post Acute Care list provided to:: Patient Choice offered to / list presented to : Patient, Sibling  Discharge Placement PASRR number recieved: 11/01/20            Patient chooses bed at: [facility] Patient to be transferred to facility by: [place] Name of family member notified: Kandyce Rud Patient and family notified of of transfer: 11/15/20  Discharge Plan and Services                                     Social Determinants of Health (SDOH) Interventions     Readmission Risk Interventions No flowsheet data found.

## 2020-11-15 NOTE — Discharge Summary (Addendum)
Physician Discharge Summary  Mike [name] FHL:[id number] DOB: Dec 25, 1961 DOA: 10/30/2020  PCP: Patient, No Pcp Per  Admit date: 10/30/2020 Discharge date: 11/15/2020  Admitted From: Home Disposition:  SNF  Recommendations for Outpatient Follow-up:  1. Follow up with PCP in 1-2 weeks 2. Follow up with vascular surgery as scheduled for post op care 3. Follow-up with urology as scheduled  Discharge Condition:Stable  CODE STATUS:DNR  Diet recommendation:  As tolerated, low salt, low fat, cardiac diet  Brief/Interim Summary: Mike [name] is a 59 year old male with medical history significant for STEMI (3/73/4287) complicated by cardiac arrest secondary to sides post PCI with chronic ST elevations in inferior leads since, severe bilateral renal artery stenosis, PAD with history of critical limb ischemia requiring right iliac revascularization of left lower extremity, chronic systolic CHF related to ischemic cardiomyopathy who presented on 11/2 with complaints of shortness of breath decrease ability to walk due to severe leg pain found to have critical limbischemia of left leg.   Patient has had ongoing issues with atherosclerotic vascular disease of the left leg but given his comorbidities (recent STEMI still requiring aspirin and Brilinta, CKD, depressed EF patient was too high risk for surgical revascularization per vascular surgery, but due to current hospitalization and further decompensation of symptoms after surgery elected to do above-knee amputation to deviate his rest pain.  Patient was transitioned from aspirin Brilinta aspirin and Plavix and underwent left AKA by Dr. Donnetta Hutching 11/9.  Patient tolerated procedure well, given ongoing amatory dysfunction need for ongoing physical therapy, gait training and to improve mobility postoperation PT recommending ongoing physical therapy at SNF, patient now accepted and otherwise stable and agreeable for discharge to SNF for ongoing physical therapy as  outlined above.  Patient will continue on aspirin Plavix and statin per vascular surgery postoperatively.  Close follow-up with PCP and vascular surgery as scheduled, repeat CBC and BMP in the next 3 to 5 days to follow-up on postoperative labs to ensure improvement and acute postop on chronic anemia.  Of note patient did have history of urinary outlet obstruction requiring in and out cath, during hospitalization postoperatively patient has had multiple episodes of urinary retention, Foley catheter now indwelling will be discharged with Foley, will need urology follow-up in the outpatient setting per urology office schedule.   Discharge Diagnoses:  Active Problems:   Coronary artery disease involving native coronary artery of native heart without angina pectoris   Mixed hyperlipidemia   Tobacco dependence   H/O cardiac arrest   Rest pain of left upper extremity due to atherosclerosis (HCC)   PAD (peripheral artery disease) (HCC)   Ischemic cardiomyopathy   Critical limb ischemia with history of revascularization of same extremity (HCC)   Hyperglycemia   Tobacco abuse   Leukocytosis   Chronic combined systolic and diastolic CHF (congestive heart failure) (HCC)   Chest pain   Acute on chronic HFrEF (heart failure with reduced ejection fraction) (HCC)   Goals of care, counseling/discussion   Palliative care by specialist   DNR (do not resuscitate)   Normal anion gap metabolic acidosis   Hypotension   Acute urinary retention   Pressure injury of skin    Discharge Instructions  Discharge Instructions    Call MD for:  redness, tenderness, or signs of infection (pain, swelling, redness, odor or green/yellow discharge around incision site)   Complete by: As directed    Call MD for:  severe uncontrolled pain   Complete by: As directed    Diet -  low sodium heart healthy   Complete by: As directed    Discharge wound care:   Complete by: As directed    Wound care  Every shift Comments:  Gently clean buttocks with soap and water, pat dry. Apply Gerhardt's butt cream to the entire excoriated area twice daily or PRN soiling.   Increase activity slowly   Complete by: As directed      Allergies as of 11/15/2020      Reactions   Chlorhexidine       Medication List    STOP taking these medications   BiDil 20-37.5 MG tablet Generic drug: isosorbide-hydrALAZINE   Corlanor 5 MG Tabs tablet Generic drug: ivabradine   furosemide 20 MG tablet Commonly known as: LASIX   oxyCODONE-acetaminophen 5-325 MG tablet Commonly known as: Percocet   ticagrelor 90 MG Tabs tablet Commonly known as: BRILINTA     TAKE these medications   aspirin 81 MG EC tablet Take 1 tablet (81 mg total) by mouth daily. Swallow whole. What changed: Another medication with the same name was removed. Continue taking this medication, and follow the directions you see here.   clopidogrel 75 MG tablet Commonly known as: PLAVIX Take 1 tablet (75 mg total) by mouth daily.   collagenase ointment Commonly known as: SANTYL Apply topically daily.   gabapentin 300 MG capsule Commonly known as: NEURONTIN Take 1 capsule (300 mg total) by mouth 2 (two) times daily.   Gerhardt's butt cream Crea Apply 1 application topically 2 (two) times daily.   liver oil-zinc oxide 40 % ointment Commonly known as: DESITIN Apply 1 application topically as needed for irritation.   metroNIDAZOLE 500 MG tablet Commonly known as: FLAGYL Take 1 tablet (500 mg total) by mouth every 8 (eight) hours.   nicotine 7 mg/24hr patch Commonly known as: NICODERM CQ - dosed in mg/24 hr Place 1 patch (7 mg total) onto the skin daily.   nitroGLYCERIN 0.4 MG SL tablet Commonly known as: NITROSTAT Place 1 tablet (0.4 mg total) under the tongue every 5 (five) minutes as needed for chest pain.   oxyCODONE 5 MG immediate release tablet Commonly known as: Oxy IR/ROXICODONE Take 1 tablet (5 mg total) by mouth every 6 (six) hours as  needed for up to 5 days for moderate pain.   rosuvastatin 10 MG tablet Commonly known as: CRESTOR Take 1 tablet (10 mg total) by mouth daily. What changed:   medication strength  how much to take   sulfamethoxazole-trimethoprim 800-160 MG tablet Commonly known as: BACTRIM DS Take 1 tablet by mouth every 12 (twelve) hours.            Discharge Care Instructions  (From admission, onward)         Start     Ordered   11/15/20 0000  Discharge wound care:       Comments: Wound care  Every shift Comments: Gently clean buttocks with soap and water, pat dry. Apply Gerhardt's butt cream to the entire excoriated area twice daily or PRN soiling.   11/15/20 0750          Follow-up Information    Nigel Mormon, MD. Call on 11/09/2020.   Specialties: Cardiology, Radiology Contact information: [place] [address] [phone]        Rosetta Posner, MD Follow up.   Specialties: Vascular Surgery, Cardiology Contact information: [address] [place]  [address] [phone]  Allergies  Allergen Reactions  . Chlorhexidine     Consultations:  Vascular surgery   Procedures/Studies: US RENAL  Result Date: 11/08/2020 CLINICAL DATA:  Urinary retention EXAM: RENAL / URINARY TRACT ULTRASOUND COMPLETE COMPARISON:  10/17/2020 FINDINGS: Right Kidney: Renal measurements: 11.8 x 5.4 x 5.8 cm = volume: 194 mL. Echogenicity within normal limits. No mass or hydronephrosis visualized. Left Kidney: Renal measurements: 10.4 x 4.7 x 4.6 cm = volume: 119 mL. Echogenicity within normal limits. Incidental simple cyst measuring 1.5 cm. No mass or hydronephrosis visualized. Bladder: Prevoid urinary bladder volume 635 mL. Patient is unable to void at the time of examination. Other: None. IMPRESSION: 1. No hydronephrosis. 2. Prevoid urinary bladder volume 635 mL. Patient is unable to void at the time of examination. Nursing staff  notified by sonographer. Electronically Signed   By: Eddie Candle M.D.   On: 11/08/2020 18:29   US RENAL  Result Date: 10/17/2020 CLINICAL DATA:  Acute kidney injury EXAM: RENAL / URINARY TRACT ULTRASOUND COMPLETE COMPARISON:  None. FINDINGS: Right Kidney: Renal measurements: 11.8 x 5.6 x 5.7 cm = volume: 197.9 mL. Echogenicity and renal cortical thickness are within normal limits. No mass, perinephric fluid, or hydronephrosis visualized. No sonographically demonstrable calculus or ureterectasis. Left Kidney: Renal measurements: 10.7 x 4.7 x 4.1 cm = volume: 109.5 mL. Echogenicity and renal cortical thickness are within normal limits. No perinephric fluid or hydronephrosis visualized. There is a cyst arising from the lower pole left kidney measuring 1.8 x 1.4 x 1.5 cm. No sonographically demonstrable calculus or ureterectasis. Bladder: Appears normal for degree of bladder distention. Other: None. IMPRESSION: Cyst arising from lower pole left kidney measuring 1.8 x 1.4 x 1.5 cm. Study otherwise unremarkable. Electronically Signed   By: Lowella Grip III M.D.   On: 10/17/2020 15:28   PERIPHERAL VASCULAR CATHETERIZATION  Result Date: 10/16/2020 Successful intravascular lithotripsy, PTCA and stenting     7.0X39 mm balloon expandable Viabahn VBX stent Rt common iliac artery     7.0X60 mm self expanding Absolute Pro stent Rt distal iliac artery No named vessels at ankle level in both lower extremities. Will consult vascular surgery for Rt fem-to-left profunda bypass given left foot critical limb ischemia. Nigel Mormon, MD Pager: [phone] Office: [phone]   DG CHEST PORT 1 VIEW  Result Date: 11/11/2020 CLINICAL DATA:  Fever and chest pain. EXAM: PORTABLE CHEST 1 VIEW COMPARISON:  October 30, 2020 FINDINGS: Cardiomediastinal silhouette is normal. Mediastinal contours appear intact. Mild streaky airspace opacities in the lung bases. Osseous structures are without acute abnormality. Soft  tissues are grossly normal. IMPRESSION: Mild streaky airspace opacities in the lung bases may represent atelectasis or atypical/viral pneumonia. Electronically Signed   By: Fidela Salisbury M.D.   On: 11/11/2020 10:28   DG Chest Portable 1 View  Result Date: 10/30/2020 CLINICAL DATA:  Chest pain EXAM: PORTABLE CHEST 1 VIEW COMPARISON:  08/03/2020 FINDINGS: Single frontal view of the chest demonstrates stenting artifact from external cardiac device. Cardiac silhouette is unremarkable. No airspace disease, effusion, or pneumothorax. No acute bony abnormalities. IMPRESSION: 1. No acute intrathoracic process. Electronically Signed   By: Randa Ngo M.D.   On: 10/30/2020 17:01   DG Abd Portable 1V  Result Date: 11/12/2020 CLINICAL DATA:  Abdominal pain EXAM: PORTABLE ABDOMEN - 1 VIEW COMPARISON:  None. FINDINGS: Gas in nondilated stomach and colon. No bowel obstruction. There is stool and gas in the rectum. No bowel wall edema. Negative for urinary tract calcifications. No skeletal abnormality. Right  iliac stents noted. IMPRESSION: Normal bowel gas pattern. Electronically Signed   By: Franchot Gallo M.D.   On: 11/12/2020 18:00   VAS [place] ABI WITH/WO TBI  Result Date: 11/14/2020 LOWER EXTREMITY DOPPLER STUDY Indications: Peripheral artery disease, and Post iliac artery stenting. High Risk Factors: Hypertension, hyperlipidemia, coronary artery disease.  Vascular Interventions: Lt AKA 11/06/20. Comparison Study: no prior Performing Technologist: Abram Sander RVS  Examination Guidelines: A complete evaluation includes at minimum, Doppler waveform signals and systolic blood pressure reading at the level of bilateral brachial, anterior tibial, and posterior tibial arteries, when vessel segments are accessible. Bilateral testing is considered an integral part of a complete examination. Photoelectric Plethysmograph (PPG) waveforms and toe systolic pressure readings are included as required and additional duplex  testing as needed. Limited examinations for reoccurring indications may be performed as noted.  ABI Findings: +--------+------------------+-----+----------+--------+ Right   Rt Pressure (mmHg)IndexWaveform  Comment  +--------+------------------+-----+----------+--------+ [id number]                    triphasic          +--------+------------------+-----+----------+--------+ PTA     48                0.35 monophasic         +--------+------------------+-----+----------+--------+ DP      48                0.35 monophasic         +--------+------------------+-----+----------+--------+ +--------+------------------+-----+---------+-------+ Left    Lt Pressure (mmHg)IndexWaveform Comment +--------+------------------+-----+---------+-------+ [id number]                    triphasic        +--------+------------------+-----+---------+-------+ +-------+-----------+-----------+------------+------------+ ABI/TBIToday's ABIToday's TBIPrevious ABIPrevious TBI +-------+-----------+-----------+------------+------------+ Right  0.35                                           +-------+-----------+-----------+------------+------------+  Summary: Right: Resting right ankle-brachial index indicates severe right lower extremity arterial disease. Left: AKA.  *See table(s) above for measurements and observations.  Electronically signed by Harold Barban MD on 11/14/2020 at 8:19:23 PM.   Final    VAS US CAROTID  Result Date: 10/17/2020 Carotid Arterial Duplex Study Indications:  Pre-surgical evaluation. Risk Factors: Hypertension, hyperlipidemia, current smoker, coronary artery               disease, PAD. Performing Technologist: Maudry Mayhew MHA, RDMS, RVT, RDCS  Examination Guidelines: A complete evaluation includes B-mode imaging, spectral Doppler, color Doppler, and power Doppler as needed of all accessible portions of each vessel. Bilateral testing is considered an integral  part of a complete examination. Limited examinations for reoccurring indications may be performed as noted.  Right Carotid Findings: +----------+--------+--------+--------+-----------------------+--------+           PSV cm/sEDV cm/sStenosisPlaque Description     Comments +----------+--------+--------+--------+-----------------------+--------+ CCA Prox  186                     smooth and heterogenous         +----------+--------+--------+--------+-----------------------+--------+ CCA Distal87      16              smooth and heterogenous         +----------+--------+--------+--------+-----------------------+--------+ ICA Prox  56      13              smooth  and heterogenous         +----------+--------+--------+--------+-----------------------+--------+ ICA Distal70      15                                              +----------+--------+--------+--------+-----------------------+--------+ ECA       139     7               smooth and heterogenous         +----------+--------+--------+--------+-----------------------+--------+ +----------+--------+-------+----------------+-------------------+           PSV cm/sEDV cmsDescribe        Arm Pressure (mmHG) +----------+--------+-------+----------------+-------------------+ [id number]            Multiphasic, WNL                    +----------+--------+-------+----------------+-------------------+ +---------+--------+--+--------+--+---------+ VertebralPSV cm/s78EDV cm/s29Antegrade +---------+--------+--+--------+--+---------+  Left Carotid Findings: +----------+--------+--------+--------+-----------------------+--------+           PSV cm/sEDV cm/sStenosisPlaque Description     Comments +----------+--------+--------+--------+-----------------------+--------+ CCA Prox  207     10                                              +----------+--------+--------+--------+-----------------------+--------+ CCA  [id number]     14              smooth and heterogenous         +----------+--------+--------+--------+-----------------------+--------+ ICA Prox  81      17              smooth and heterogenous         +----------+--------+--------+--------+-----------------------+--------+ ICA Distal109     29                                              +----------+--------+--------+--------+-----------------------+--------+ ECA       240     14              smooth and heterogenous         +----------+--------+--------+--------+-----------------------+--------+ +----------+--------+--------+--------+-------------------+           PSV cm/sEDV cm/sDescribeArm Pressure (mmHG) +----------+--------+--------+--------+-------------------+ Subclavian310                                         +----------+--------+--------+--------+-------------------+ +---------+--------+---+--------+--+---------------------------------+ VertebralPSV cm/s293EDV cm/s47>50% cervical stenosis, antegrade +---------+--------+---+--------+--+---------------------------------+   Summary: Right Carotid: Velocities in the right ICA are consistent with a 1-39% stenosis. Left Carotid: Velocities in the left ICA are consistent with a 1-39% stenosis. Vertebrals:  Bilateral vertebral arteries demonstrate antegrade flow. Left >50%              cervical stenosis. Subclavians: Normal flow hemodynamics were seen in bilateral subclavian              arteries. *See table(s) above for measurements and observations.  Electronically signed by Harold Barban MD on 10/17/2020 at 7:38:49 PM.    Final       Subjective: No acute issues or events overnight, pain well controlled, patient denies chest pain, shortness of breath, nausea, vomiting, diarrhea,  constipation, headache, fevers, chills.   Discharge Exam: Vitals:   11/14/20 1955 11/15/20 0408  BP: 128/64 125/63  Pulse: 75 63  Resp: 16 18  Temp: 98.6 F (37 C) 98.3 F  (36.8 C)  SpO2: 98% 97%   Vitals:   11/14/20 1118 11/14/20 1955 11/15/20 0011 11/15/20 0408  BP: 122/62 128/64  125/63  Pulse: 67 75  63  Resp: 20 16  18   Temp: 98.2 F (36.8 C) 98.6 F (37 C)  98.3 F (36.8 C)  TempSrc: Oral Oral  Oral  SpO2: 99% 98%  97%  Weight:   65.8 kg   Height:        General: Pt is alert, awake, not in acute distress Cardiovascular: RRR, S1/S2 +, no rubs, no gallops Respiratory: CTA bilaterally, no wheezing, no rhonchi Abdominal: Soft, NT, ND, bowel sounds + Extremities: no edema, no cyanosis, left lower extremity postoperative changes noted without bleeding, bandage clean dry intact    The results of significant diagnostics from this hospitalization (including imaging, microbiology, ancillary and laboratory) are listed below for reference.     Microbiology: Recent Results (from the past 240 hour(s))  Surgical PCR screen     Status: None   Collection Time: 11/06/20  2:40 AM   Specimen: Nasal Mucosa; Nasal Swab  Result Value Ref Range Status   MRSA, PCR NEGATIVE NEGATIVE Final   Staphylococcus aureus NEGATIVE NEGATIVE Final    Comment: (NOTE) The Xpert SA Assay (FDA approved for NASAL specimens in patients 71 years of age and older), is one component of a comprehensive surveillance program. It is not intended to diagnose infection nor to guide or monitor treatment. Performed at [facility], [place] [address], [place], [place] [address]   Culture, blood (routine x 2)     Status: None (Preliminary result)   Collection Time: 11/11/20  8:23 AM   Specimen: BLOOD  Result Value Ref Range Status   Specimen Description BLOOD RIGHT ANTECUBITAL  Final   Special Requests   Final    BOTTLES DRAWN AEROBIC AND ANAEROBIC Blood Culture results may not be optimal due to an inadequate volume of blood received in culture bottles   Culture   Final    NO GROWTH 3 DAYS Performed at [facility], [place] [address] [place], [place] [address]    Report  Status PENDING  Incomplete  Culture, blood (routine x 2)     Status: None (Preliminary result)   Collection Time: 11/11/20  8:28 AM   Specimen: BLOOD  Result Value Ref Range Status   Specimen Description BLOOD LEFT ANTECUBITAL  Final   Special Requests   Final    BOTTLES DRAWN AEROBIC ONLY Blood Culture results may not be optimal due to an inadequate volume of blood received in culture bottles   Culture   Final    NO GROWTH 3 DAYS Performed at [facility], [place] [address], [place], [place] [address]    Report Status PENDING  Incomplete  Culture, Urine     Status: None   Collection Time: 11/11/20  8:35 AM   Specimen: Urine, Catheterized  Result Value Ref Range Status   Specimen Description URINE, CATHETERIZED  Final   Special Requests NONE  Final   Culture   Final    NO GROWTH Performed at [facility], [address] [place], [place] [address]    Report Status 11/12/2020 FINAL  Final  SARS Coronavirus 2 by RT PCR (hospital  order, performed in [facility] hospital lab) Nasopharyngeal Nasopharyngeal Swab     Status: None   Collection Time: 11/12/20 10:19 AM   Specimen: Nasopharyngeal Swab  Result Value Ref Range Status   SARS Coronavirus 2 NEGATIVE NEGATIVE Final    Comment: (NOTE) SARS-CoV-2 target nucleic acids are NOT DETECTED.  The SARS-CoV-2 RNA is generally detectable in upper and lower respiratory specimens during the acute phase of infection. The lowest concentration of SARS-CoV-2 viral copies this assay can detect is 250 copies / mL. A negative result does not preclude SARS-CoV-2 infection and should not be used as the sole basis for treatment or other patient management decisions.  A negative result may occur with improper specimen collection / handling, submission of specimen other than nasopharyngeal swab, presence of viral mutation(s) within the areas targeted by this assay, and inadequate number of viral copies (<250 copies / mL). A negative result  must be combined with clinical observations, patient history, and epidemiological information.  Fact Sheet for Patients:   StrictlyIdeas.no  Fact Sheet for Healthcare Providers: BankingDealers.co.za  This test is not yet approved or  cleared by the [place] FDA and has been authorized for detection and/or diagnosis of SARS-CoV-2 by FDA under an Emergency Use Authorization (EUA).  This EUA will remain in effect (meaning this test can be used) for the duration of the COVID-19 declaration under Section 564(b)(1) of the Act, 21 U.S.C. section 360bbb-3(b)(1), unless the authorization is terminated or revoked sooner.  Performed at [facility], [place] [address] [place], [place] [address]   SARS Coronavirus 2 by RT PCR (hospital order, performed in [facility] hospital lab) Nasopharyngeal Nasopharyngeal Swab     Status: None   Collection Time: 11/14/20 11:29 AM   Specimen: Nasopharyngeal Swab  Result Value Ref Range Status   SARS Coronavirus 2 NEGATIVE NEGATIVE Final    Comment: (NOTE) SARS-CoV-2 target nucleic acids are NOT DETECTED.  The SARS-CoV-2 RNA is generally detectable in upper and lower respiratory specimens during the acute phase of infection. The lowest concentration of SARS-CoV-2 viral copies this assay can detect is 250 copies / mL. A negative result does not preclude SARS-CoV-2 infection and should not be used as the sole basis for treatment or other patient management decisions.  A negative result may occur with improper specimen collection / handling, submission of specimen other than nasopharyngeal swab, presence of viral mutation(s) within the areas targeted by this assay, and inadequate number of viral copies (<250 copies / mL). A negative result must be combined with clinical observations, patient history, and epidemiological information.  Fact Sheet for Patients:    StrictlyIdeas.no  Fact Sheet for Healthcare Providers: BankingDealers.co.za  This test is not yet approved or  cleared by the [place] FDA and has been authorized for detection and/or diagnosis of SARS-CoV-2 by FDA under an Emergency Use Authorization (EUA).  This EUA will remain in effect (meaning this test can be used) for the duration of the COVID-19 declaration under Section 564(b)(1) of the Act, 21 U.S.C. section 360bbb-3(b)(1), unless the authorization is terminated or revoked sooner.  Performed at [facility], [place] [address], [place], [place] [address]      Labs: BNP (last 3 results) Recent Labs    08/03/20 0644 10/22/20 1054 10/30/20 1820  BNP 751.4* 261.5* 540.0*   Basic Metabolic Panel: Recent Labs  Lab 11/09/20 0343 11/10/20 0544 11/11/20 0141 11/12/20 0601 11/13/20 0409 11/14/20 0236 11/15/20 0338  NA   < >  --  137 139 141 141 140  K   < > 4.0 3.4* 3.8 3.5 3.2* 3.3*  CL   < >  --  104 107 109 112* 110  CO2   < >  --  24 23 24 22 23   GLUCOSE   < >  --  128* 111* 105* 96 99  BUN   < >  --  24* 22 23 18 21   CREATININE   < >  --  1.06 1.13 0.89 0.76 0.77  CALCIUM   < >  --  8.3* 8.3* 8.3* 8.0* 7.8*  MG  --  1.8 1.7  --   --   --   --    < > = values in this interval not displayed.   Liver Function Tests: Recent Labs  Lab 11/12/20 1505 11/13/20 0409 11/14/20 0236 11/15/20 0338  AST 55* 50* 47* 37  ALT 72* 75* 70* 61*  ALKPHOS 51 55 54 62  BILITOT 0.6 0.6 0.5 0.4  PROT 5.7* 5.5* 5.0* 5.0*  ALBUMIN 2.0* 1.9* 1.8* 1.8*   No results for input(s): LIPASE, AMYLASE in the last 168 hours. Recent Labs  Lab 11/11/20 0828  AMMONIA 14   CBC: Recent Labs  Lab 11/11/20 0141 11/12/20 0601 11/13/20 0409 11/14/20 0236 11/15/20 0338  WBC 12.3* 11.5* 11.1* 8.7 9.3  NEUTROABS 10.4* 9.4* 8.4* 6.3 6.7  HGB 7.8* 7.5* 7.6* 7.1* 7.0*  HCT 25.1* 23.8* 23.9* 22.9* 22.2*  MCV 89.3 90.5 90.9 90.2  90.2  PLT 226 220 242 242 269   Cardiac Enzymes: No results for input(s): CKTOTAL, CKMB, CKMBINDEX, TROPONINI in the last 168 hours. BNP: Invalid input(s): POCBNP CBG: Recent Labs  Lab 11/14/20 1608 11/14/20 1933 11/15/20 0008 11/15/20 0335 11/15/20 0728  GLUCAP 145* 117* 110* 106* 88   D-Dimer No results for input(s): DDIMER in the last 72 hours. Hgb A1c No results for input(s): HGBA1C in the last 72 hours. Lipid Profile No results for input(s): CHOL, HDL, LDLCALC, TRIG, CHOLHDL, LDLDIRECT in the last 72 hours. Thyroid function studies No results for input(s): TSH, T4TOTAL, T3FREE, THYROIDAB in the last 72 hours.  Invalid input(s): FREET3 Anemia work up No results for input(s): VITAMINB12, FOLATE, FERRITIN, TIBC, IRON, RETICCTPCT in the last 72 hours. Urinalysis    Component Value Date/Time   COLORURINE YELLOW 11/11/2020 0935   APPEARANCEUR CLEAR 11/11/2020 0935   LABSPEC 1.017 11/11/2020 0935   PHURINE 8.0 11/11/2020 0935   GLUCOSEU NEGATIVE 11/11/2020 0935   HGBUR NEGATIVE 11/11/2020 0935   BILIRUBINUR NEGATIVE 11/11/2020 0935   KETONESUR NEGATIVE 11/11/2020 0935   PROTEINUR 30 (A) 11/11/2020 0935   NITRITE NEGATIVE 11/11/2020 0935   LEUKOCYTESUR SMALL (A) 11/11/2020 0935   Sepsis Labs Invalid input(s): PROCALCITONIN,  WBC,  LACTICIDVEN Microbiology Recent Results (from the past 240 hour(s))  Surgical PCR screen     Status: None   Collection Time: 11/06/20  2:40 AM   Specimen: Nasal Mucosa; Nasal Swab  Result Value Ref Range Status   MRSA, PCR NEGATIVE NEGATIVE Final   Staphylococcus aureus NEGATIVE NEGATIVE Final    Comment: (NOTE) The Xpert SA Assay (FDA approved for NASAL specimens in patients 60 years of age and older), is one component of a comprehensive surveillance program. It is not intended to diagnose infection nor to guide or monitor treatment. Performed at [facility], [place] [address], [place], [place] [address]   Culture, blood  (routine x 2)     Status: None (Preliminary result)  Collection Time: 11/11/20  8:23 AM   Specimen: BLOOD  Result Value Ref Range Status   Specimen Description BLOOD RIGHT ANTECUBITAL  Final   Special Requests   Final    BOTTLES DRAWN AEROBIC AND ANAEROBIC Blood Culture results may not be optimal due to an inadequate volume of blood received in culture bottles   Culture   Final    NO GROWTH 3 DAYS Performed at [facility], [place] [address] [place], [place] [address]    Report Status PENDING  Incomplete  Culture, blood (routine x 2)     Status: None (Preliminary result)   Collection Time: 11/11/20  8:28 AM   Specimen: BLOOD  Result Value Ref Range Status   Specimen Description BLOOD LEFT ANTECUBITAL  Final   Special Requests   Final    BOTTLES DRAWN AEROBIC ONLY Blood Culture results may not be optimal due to an inadequate volume of blood received in culture bottles   Culture   Final    NO GROWTH 3 DAYS Performed at [facility], [place] [address] [place], [place] [address]    Report Status PENDING  Incomplete  Culture, Urine     Status: None   Collection Time: 11/11/20  8:35 AM   Specimen: Urine, Catheterized  Result Value Ref Range Status   Specimen Description URINE, CATHETERIZED  Final   Special Requests NONE  Final   Culture   Final    NO GROWTH Performed at [facility], [address] [place], [place] [address]    Report Status 11/12/2020 FINAL  Final  SARS Coronavirus 2 by RT PCR (hospital order, performed in [facility] hospital lab) Nasopharyngeal Nasopharyngeal Swab     Status: None   Collection Time: 11/12/20 10:19 AM   Specimen: Nasopharyngeal Swab  Result Value Ref Range Status   SARS Coronavirus 2 NEGATIVE NEGATIVE Final    Comment: (NOTE) SARS-CoV-2 target nucleic acids are NOT DETECTED.  The SARS-CoV-2 RNA is generally detectable in upper and lower respiratory specimens during the acute phase of infection. The lowest concentration of  SARS-CoV-2 viral copies this assay can detect is 250 copies / mL. A negative result does not preclude SARS-CoV-2 infection and should not be used as the sole basis for treatment or other patient management decisions.  A negative result may occur with improper specimen collection / handling, submission of specimen other than nasopharyngeal swab, presence of viral mutation(s) within the areas targeted by this assay, and inadequate number of viral copies (<250 copies / mL). A negative result must be combined with clinical observations, patient history, and epidemiological information.  Fact Sheet for Patients:   StrictlyIdeas.no  Fact Sheet for Healthcare Providers: BankingDealers.co.za  This test is not yet approved or  cleared by the [place] FDA and has been authorized for detection and/or diagnosis of SARS-CoV-2 by FDA under an Emergency Use Authorization (EUA).  This EUA will remain in effect (meaning this test can be used) for the duration of the COVID-19 declaration under Section 564(b)(1) of the Act, 21 U.S.C. section 360bbb-3(b)(1), unless the authorization is terminated or revoked sooner.  Performed at [facility], [place] [address], [place], [place] [address]   SARS Coronavirus 2 by RT PCR (hospital order, performed in [facility] hospital lab) Nasopharyngeal Nasopharyngeal Swab     Status: None   Collection Time: 11/14/20 11:29 AM   Specimen: Nasopharyngeal Swab  Result Value Ref Range Status   SARS Coronavirus 2 NEGATIVE  NEGATIVE Final    Comment: (NOTE) SARS-CoV-2 target nucleic acids are NOT DETECTED.  The SARS-CoV-2 RNA is generally detectable in upper and lower respiratory specimens during the acute phase of infection. The lowest concentration of SARS-CoV-2 viral copies this assay can detect is 250 copies / mL. A negative result does not preclude SARS-CoV-2 infection and should not be used as the sole basis  for treatment or other patient management decisions.  A negative result may occur with improper specimen collection / handling, submission of specimen other than nasopharyngeal swab, presence of viral mutation(s) within the areas targeted by this assay, and inadequate number of viral copies (<250 copies / mL). A negative result must be combined with clinical observations, patient history, and epidemiological information.  Fact Sheet for Patients:   StrictlyIdeas.no  Fact Sheet for Healthcare Providers: BankingDealers.co.za  This test is not yet approved or  cleared by the [place] FDA and has been authorized for detection and/or diagnosis of SARS-CoV-2 by FDA under an Emergency Use Authorization (EUA).  This EUA will remain in effect (meaning this test can be used) for the duration of the COVID-19 declaration under Section 564(b)(1) of the Act, 21 U.S.C. section 360bbb-3(b)(1), unless the authorization is terminated or revoked sooner.  Performed at [facility], [place] [address] [place], [place] [address]      Time coordinating discharge: Over 30 minutes  SIGNED:   Little Ishikawa, DO [facility] 11/15/2020, 7:50 AM Pager   If 7PM-7AM, please contact night-coverage [email or web address]

## 2020-11-16 LAB — CULTURE, BLOOD (ROUTINE X 2)
Culture: NO GROWTH
Culture: NO GROWTH

## 2020-11-20 DIAGNOSIS — R339 Retention of urine, unspecified: Secondary | ICD-10-CM | POA: Diagnosis not present

## 2020-11-20 DIAGNOSIS — N401 Enlarged prostate with lower urinary tract symptoms: Secondary | ICD-10-CM | POA: Diagnosis not present

## 2020-11-20 DIAGNOSIS — G546 Phantom limb syndrome with pain: Secondary | ICD-10-CM | POA: Diagnosis not present

## 2020-11-20 DIAGNOSIS — I739 Peripheral vascular disease, unspecified: Secondary | ICD-10-CM | POA: Diagnosis not present

## 2020-11-26 DIAGNOSIS — Z89612 Acquired absence of left leg above knee: Secondary | ICD-10-CM | POA: Diagnosis not present

## 2020-11-26 DIAGNOSIS — R339 Retention of urine, unspecified: Secondary | ICD-10-CM | POA: Diagnosis not present

## 2020-11-26 DIAGNOSIS — N401 Enlarged prostate with lower urinary tract symptoms: Secondary | ICD-10-CM | POA: Diagnosis not present

## 2020-11-27 DIAGNOSIS — N401 Enlarged prostate with lower urinary tract symptoms: Secondary | ICD-10-CM | POA: Diagnosis not present

## 2020-11-27 DIAGNOSIS — R339 Retention of urine, unspecified: Secondary | ICD-10-CM | POA: Diagnosis not present

## 2020-11-28 ENCOUNTER — Other Ambulatory Visit: Payer: Self-pay

## 2020-11-28 ENCOUNTER — Encounter (HOSPITAL_BASED_OUTPATIENT_CLINIC_OR_DEPARTMENT_OTHER): Payer: BC Managed Care – PPO | Attending: Physician Assistant | Admitting: Physician Assistant

## 2020-11-28 DIAGNOSIS — I252 Old myocardial infarction: Secondary | ICD-10-CM | POA: Insufficient documentation

## 2020-11-28 DIAGNOSIS — Z89612 Acquired absence of left leg above knee: Secondary | ICD-10-CM | POA: Insufficient documentation

## 2020-11-28 DIAGNOSIS — I701 Atherosclerosis of renal artery: Secondary | ICD-10-CM | POA: Diagnosis not present

## 2020-11-28 DIAGNOSIS — T8131XA Disruption of external operation (surgical) wound, not elsewhere classified, initial encounter: Secondary | ICD-10-CM | POA: Insufficient documentation

## 2020-11-28 DIAGNOSIS — X58XXXA Exposure to other specified factors, initial encounter: Secondary | ICD-10-CM | POA: Insufficient documentation

## 2020-11-28 DIAGNOSIS — Z833 Family history of diabetes mellitus: Secondary | ICD-10-CM | POA: Insufficient documentation

## 2020-11-28 DIAGNOSIS — I255 Ischemic cardiomyopathy: Secondary | ICD-10-CM | POA: Diagnosis not present

## 2020-11-28 DIAGNOSIS — L89323 Pressure ulcer of left buttock, stage 3: Secondary | ICD-10-CM | POA: Insufficient documentation

## 2020-11-28 DIAGNOSIS — I739 Peripheral vascular disease, unspecified: Secondary | ICD-10-CM | POA: Diagnosis not present

## 2020-11-28 DIAGNOSIS — Z8673 Personal history of transient ischemic attack (TIA), and cerebral infarction without residual deficits: Secondary | ICD-10-CM | POA: Insufficient documentation

## 2020-11-28 DIAGNOSIS — Z955 Presence of coronary angioplasty implant and graft: Secondary | ICD-10-CM | POA: Insufficient documentation

## 2020-11-28 DIAGNOSIS — I5022 Chronic systolic (congestive) heart failure: Secondary | ICD-10-CM | POA: Diagnosis not present

## 2020-11-28 DIAGNOSIS — Z8674 Personal history of sudden cardiac arrest: Secondary | ICD-10-CM | POA: Diagnosis not present

## 2020-11-28 DIAGNOSIS — Z8249 Family history of ischemic heart disease and other diseases of the circulatory system: Secondary | ICD-10-CM | POA: Diagnosis not present

## 2020-11-28 DIAGNOSIS — I2510 Atherosclerotic heart disease of native coronary artery without angina pectoris: Secondary | ICD-10-CM | POA: Insufficient documentation

## 2020-11-28 DIAGNOSIS — Z87891 Personal history of nicotine dependence: Secondary | ICD-10-CM | POA: Insufficient documentation

## 2020-11-28 NOTE — Progress Notes (Signed)
Mike [name] ([id number]) Visit Report for 11/28/2020 Allergy List Details Patient Name: Date of Service: Mike [name]. 11/28/2020 1:15 PM Medical Record Number: [id number] Patient Account Number: [id number] Date of Birth/Sex: Treating RN: 1961/07/22 (59 y.o. [name] Primary Care Reyanne Hussar: PA Haig Prophet, NO Other Clinician: Referring Dane Kopke: Treating Nicoletta Hush/Extender: Worthy Keeler PA TWA RDHA N, MA NISH Weeks in Treatment: 0 Allergies Active Allergies No Known Drug Allergies Allergy Notes Electronic Signature(s) Signed: 11/28/2020 5:34:45 PM By: Deon Pilling Entered By: Deon Pilling on 11/28/2020 13:51:29 -------------------------------------------------------------------------------- Arrival Information Details Patient Name: Date of Service: Mike Dike HN S. 11/28/2020 1:15 PM Medical Record Number: [id number] Patient Account Number: [id number] Date of Birth/Sex: Treating RN: [birth year] (59 y.o. [name] Primary Care Bekim Werntz: PA Haig Prophet, NO Other Clinician: Referring Donyale Falcon: Treating Camiah Humm/Extender: Worthy Keeler PA TWA RDHA N, MA NISH Weeks in Treatment: 0 Visit Information Patient Arrived: Wheel Chair Arrival Time: 13:45 Accompanied By: sister Transfer Assistance: None Patient Identification Verified: Yes Secondary Verification Process Completed: Yes Patient Requires Transmission-Based Precautions: No Patient Has Alerts: Yes Patient Alerts: Patient on Blood Thinner Electronic Signature(s) Signed: 11/28/2020 5:34:45 PM By: Deon Pilling Entered By: Deon Pilling on 11/28/2020 13:47:39 -------------------------------------------------------------------------------- Clinic Level of Care Assessment Details Patient Name: Date of Service: Mike [name] 11/28/2020 1:15 PM Medical Record Number: [id number] Patient Account Number: [id number] Date of Birth/Sex: Treating RN: 05-Mar-1961 (59 y.o. [name] Primary Care Fallou Hulbert:  PA Haig Prophet, NO Other Clinician: Referring Madisin Hasan: Treating Jeston Junkins/Extender: Worthy Keeler PA TWA RDHA N, MA NISH Weeks in Treatment: 0 Clinic Level of Care Assessment Items TOOL 2 Quantity Score []  - 0 Use when only an EandM is performed on the INITIAL visit ASSESSMENTS - Nursing Assessment / Reassessment X- 1 20 General Physical Exam (combine w/ comprehensive assessment (listed just below) when performed on new pt. evals) X- 1 25 Comprehensive Assessment (HX, ROS, Risk Assessments, Wounds Hx, etc.) ASSESSMENTS - Wound and Skin A ssessment / Reassessment []  - 0 Simple Wound Assessment / Reassessment - one wound X- 2 5 Complex Wound Assessment / Reassessment - multiple wounds []  - 0 Dermatologic / Skin Assessment (not related to wound area) ASSESSMENTS - Ostomy and/or Continence Assessment and Care []  - 0 Incontinence Assessment and Management []  - 0 Ostomy Care Assessment and Management (repouching, etc.) PROCESS - Coordination of Care X - Simple Patient / Family Education for ongoing care 1 15 []  - 0 Complex (extensive) Patient / Family Education for ongoing care X- 1 10 Staff obtains Programmer, systems, Records, T Results / Process Orders est X- 1 10 Staff telephones HHA, Nursing Homes / Clarify orders / etc []  - 0 Routine Transfer to another Facility (non-emergent condition) []  - 0 Routine Hospital Admission (non-emergent condition) []  - 0 New Admissions / Biomedical engineer / Ordering NPWT Apligraf, etc. , []  - 0 Emergency Hospital Admission (emergent condition) X- 1 10 Simple Discharge Coordination []  - 0 Complex (extensive) Discharge Coordination PROCESS - Special Needs []  - 0 Pediatric / Minor Patient Management []  - 0 Isolation Patient Management []  - 0 Hearing / Language / Visual special needs []  - 0 Assessment of Community assistance (transportation, D/C planning, etc.) []  - 0 Additional assistance / Altered mentation []  - 0 Support Surface(s)  Assessment (bed, cushion, seat, etc.) INTERVENTIONS - Wound Cleansing / Measurement X- 1 5 Wound Imaging (photographs - any number of wounds) []  - 0 Wound Tracing (instead of photographs) []  - 0 Simple Wound Measurement -  one wound X- 2 5 Complex Wound Measurement - multiple wounds []  - 0 Simple Wound Cleansing - one wound X- 2 5 Complex Wound Cleansing - multiple wounds INTERVENTIONS - Wound Dressings X - Small Wound Dressing one or multiple wounds 2 10 []  - 0 Medium Wound Dressing one or multiple wounds []  - 0 Large Wound Dressing one or multiple wounds []  - 0 Application of Medications - injection INTERVENTIONS - Miscellaneous []  - 0 External ear exam []  - 0 Specimen Collection (cultures, biopsies, blood, body fluids, etc.) []  - 0 Specimen(s) / Culture(s) sent or taken to Lab for analysis []  - 0 Patient Transfer (multiple staff / Civil Service fast streamer / Similar devices) []  - 0 Simple Staple / Suture removal (25 or less) []  - 0 Complex Staple / Suture removal (26 or more) []  - 0 Hypo / Hyperglycemic Management (close monitor of Blood Glucose) []  - 0 Ankle / Brachial Index (ABI) - do not check if billed separately Has the patient been seen at the hospital within the last three years: Yes Total Score: 145 Level Of Care: New/Established - Level 4 Electronic Signature(s) Signed: 11/28/2020 5:21:24 PM By: Baruch Gouty RN, [name] Entered By: Baruch Gouty on 11/28/2020 14:32:41 -------------------------------------------------------------------------------- Encounter Discharge Information Details Patient Name: Date of Service: Mike Dike HN S. 11/28/2020 1:15 PM Medical Record Number: [id number] Patient Account Number: [id number] Date of Birth/Sex: Treating RN: 28-Jul-1961 (59 y.o. Mike [name]) Carlene Coria Primary Care Wayland Baik: PA Haig Prophet, NO Other Clinician: Referring Gilmar Bua: Treating Aaren Atallah/Extender: Worthy Keeler PA TWA RDHA N, MA NISH Weeks in Treatment: 0 Encounter  Discharge Information Items Discharge Condition: Stable Ambulatory Status: Wheelchair Discharge Destination: Home Transportation: Private Auto Accompanied By: sister Schedule Follow-up Appointment: Yes Clinical Summary of Care: Patient Declined Electronic Signature(s) Signed: 11/28/2020 4:46:53 PM By: Carlene Coria RN Entered By: Carlene Coria on 11/28/2020 14:56:46 -------------------------------------------------------------------------------- Lower Extremity Assessment Details Patient Name: Date of Service: Mike [name] 11/28/2020 1:15 PM Medical Record Number: [id number] Patient Account Number: [id number] Date of Birth/Sex: Treating RN: 06-02-1961 (59 y.o. [name] Primary Care Uva Runkel: PA Darnelle Spangle Other Clinician: Referring Kerria Sapien: Treating Daziyah Cogan/Extender: Worthy Keeler PA TWA RDHA N, MA NISH Weeks in Treatment: 0 Electronic Signature(s) Signed: 11/28/2020 5:34:45 PM By: Deon Pilling Entered By: Deon Pilling on 11/28/2020 13:54:38 -------------------------------------------------------------------------------- [place] Details Patient Name: Date of Service: Mike Dike HN S. 11/28/2020 1:15 PM Medical Record Number: [id number] Patient Account Number: [id number] Date of Birth/Sex: Treating RN: 15-Jun-1961 (59 y.o. [name] Primary Care Roosevelt Eimers: PA Haig Prophet, NO Other Clinician: Referring Romona Murdy: Treating Etana Beets/Extender: Worthy Keeler PA TWA RDHA N, MA NISH Weeks in Treatment: 0 Active Inactive Pressure Nursing Diagnoses: Knowledge deficit related to management of pressures ulcers Potential for impaired tissue integrity related to pressure, friction, moisture, and shear Goals: Patient/caregiver will verbalize understanding of pressure ulcer management Date Initiated: 11/28/2020 Target Resolution Date: 12/26/2020 Goal Status: Active Interventions: Assess: immobility, friction, shearing, incontinence upon  admission and as needed Assess offloading mechanisms upon admission and as needed Assess potential for pressure ulcer upon admission and as needed Provide education on pressure ulcers Notes: Wound/Skin Impairment Nursing Diagnoses: Impaired tissue integrity Knowledge deficit related to ulceration/compromised skin integrity Goals: Patient/caregiver will verbalize understanding of skin care regimen Date Initiated: 11/28/2020 Target Resolution Date: 12/26/2020 Goal Status: Active Ulcer/skin breakdown will have a volume reduction of 30% by week 4 Date Initiated: 11/28/2020 Target Resolution Date: 12/26/2020 Goal Status: Active Interventions: Assess patient/caregiver ability to obtain necessary  supplies Assess patient/caregiver ability to perform ulcer/skin care regimen upon admission and as needed Assess ulceration(s) every visit Provide education on ulcer and skin care Treatment Activities: Skin care regimen initiated : 11/28/2020 Topical wound management initiated : 11/28/2020 Notes: Electronic Signature(s) Signed: 11/28/2020 5:21:24 PM By: Baruch Gouty RN, [name] Entered By: Baruch Gouty on 11/28/2020 14:12:42 -------------------------------------------------------------------------------- Pain Assessment Details Patient Name: Date of Service: Mike Dike HN S. 11/28/2020 1:15 PM Medical Record Number: [id number] Patient Account Number: [id number] Date of Birth/Sex: Treating RN: Nov 25, 1961 (59 y.o. [name] Primary Care Rani Sisney: PA Haig Prophet, NO Other Clinician: Referring Darrel Gloss: Treating Asriel Westrup/Extender: Worthy Keeler PA TWA RDHA N, MA NISH Weeks in Treatment: 0 Active Problems Location of Pain Severity and Description of Pain Patient Has Paino No Site Locations Rate the pain. Current Pain Level: 0 Pain Management and Medication Current Pain Management: Medication: No Cold Application: No Rest: No Massage: No Activity: No T.E.N.S.: No Heat  Application: No Leg drop or elevation: No Is the Current Pain Management Adequate: Adequate How does your wound impact your activities of daily livingo Sleep: No Bathing: No Appetite: No Relationship With Others: No Bladder Continence: No Emotions: No Bowel Continence: No Work: No Toileting: No Drive: No Dressing: No Hobbies: No Electronic Signature(s) Signed: 11/28/2020 5:34:45 PM By: Deon Pilling Entered By: Deon Pilling on 11/28/2020 13:54:50 -------------------------------------------------------------------------------- Patient/Caregiver Education Details Patient Name: Date of Service: Mike [name] 12/1/2021andnbsp1:15 PM Medical Record Number: [id number] Patient Account Number: [id number] Date of Birth/Gender: Treating RN: Apr 20, 1961 (59 y.o. [name] Primary Care Physician: PA Haig Prophet, NO Other Clinician: Referring Physician: Treating Physician/Extender: Worthy Keeler PA TWA RDHA N, MA NISH Weeks in Treatment: 0 Education Assessment Education Provided To: Patient Education Topics Provided Pressure: Handouts: Pressure Ulcers: Care and Offloading, Pressure Ulcers: Care and Offloading 2 Methods: Explain/Verbal, Printed Responses: Reinforcements needed, State content correctly [place]: o Handouts: Welcome T [facility] [place] o Methods: Explain/Verbal, Printed Responses: Reinforcements needed, State content correctly Wound/Skin Impairment: Handouts: Caring for Your Ulcer, Skin Care Do's and Dont's Methods: Explain/Verbal, Printed Responses: Reinforcements needed, State content correctly Electronic Signature(s) Signed: 11/28/2020 5:21:24 PM By: Baruch Gouty RN, [name] Entered By: Baruch Gouty on 11/28/2020 14:14:17 -------------------------------------------------------------------------------- Wound Assessment Details Patient Name: Date of Service: Mike Dike HN S. 11/28/2020 1:15 PM Medical Record  Number: [id number] Patient Account Number: [id number] Date of Birth/Sex: Treating RN: Jan 01, 1961 (59 y.o. Lorette Ang, Meta.Reding Primary Care Kojo Liby: PA TIENT, NO Other Clinician: Referring Birtie Fellman: Treating Jahni Nazar/Extender: Worthy Keeler PA TWA RDHA N, MA NISH Weeks in Treatment: 0 Wound Status Wound Number: 1 Primary Pressure Ulcer Etiology: Wound Location: Left, Proximal Gluteus Wound Open Wounding Event: Pressure Injury Status: Date Acquired: 10/17/2020 Comorbid Sleep Apnea, Coronary Artery Disease, Hypotension, Myocardial Weeks Of Treatment: 0 History: Infarction, Peripheral Arterial Disease Clustered Wound: No Wound Measurements Length: (cm) 0.3 Width: (cm) 0.4 Depth: (cm) 0.1 Area: (cm) 0.094 Volume: (cm) 0.009 % Reduction in Area: % Reduction in Volume: Epithelialization: None Tunneling: No Undermining: No Wound Description Classification: Category/Stage II Wound Margin: Distinct, outline attached Exudate Amount: Medium Exudate Type: Serosanguineous Exudate Color: red, brown Foul Odor After Cleansing: No Slough/Fibrino No Wound Bed Granulation Amount: Large (67-100%) Exposed Structure Granulation Quality: Red, Pink Fascia Exposed: No Necrotic Amount: None Present (0%) Fat Layer (Subcutaneous Tissue) Exposed: Yes Tendon Exposed: No Muscle Exposed: No Joint Exposed: No Bone Exposed: No Treatment Notes Wound #1 (Left, Proximal Gluteus) 1. Cleanse With Wound Cleanser  3. Primary Dressing Applied Collegen AG 4. Secondary Dressing Dry Gauze 5. Secured With Tape Notes normal saline to moisten collagen Electronic Signature(s) Signed: 11/28/2020 5:34:45 PM By: Deon Pilling Entered By: Deon Pilling on 11/28/2020 14:07:31 -------------------------------------------------------------------------------- Wound Assessment Details Patient Name: Date of Service: Mike Dike HN S. 11/28/2020 1:15 PM Medical Record Number: [id number] Patient Account  Number: [id number] Date of Birth/Sex: Treating RN: 1961/04/19 (59 y.o. Lorette Ang, Meta.Reding Primary Care Darwin Guastella: PA TIENT, NO Other Clinician: Referring Cypher Paule: Treating Dearius Hoffmann/Extender: Worthy Keeler PA TWA RDHA N, MA NISH Weeks in Treatment: 0 Wound Status Wound Number: 2 Primary Pressure Ulcer Etiology: Wound Location: Left, Distal Gluteus Wound Open Wounding Event: Pressure Injury Status: Date Acquired: 10/17/2020 Comorbid Sleep Apnea, Coronary Artery Disease, Hypotension, Myocardial Weeks Of Treatment: 0 History: Infarction, Peripheral Arterial Disease Clustered Wound: No Wound Measurements Length: (cm) 0.6 Width: (cm) 0.3 Depth: (cm) 0.1 Area: (cm) 0.141 Volume: (cm) 0.014 % Reduction in Area: 0% % Reduction in Volume: 0% Epithelialization: None Tunneling: No Undermining: No Wound Description Classification: Category/Stage III Wound Margin: Distinct, outline attached Exudate Amount: Medium Exudate Type: Serosanguineous Exudate Color: red, brown Foul Odor After Cleansing: No Slough/Fibrino Yes Wound Bed Granulation Amount: Large (67-100%) Exposed Structure Granulation Quality: Red, Pink Fascia Exposed: No Necrotic Amount: Small (1-33%) Fat Layer (Subcutaneous Tissue) Exposed: Yes Necrotic Quality: Adherent Slough Tendon Exposed: No Muscle Exposed: No Joint Exposed: No Bone Exposed: No Treatment Notes Wound #2 (Left, Distal Gluteus) 1. Cleanse With Wound Cleanser 3. Primary Dressing Applied Collegen AG 4. Secondary Dressing Dry Gauze 5. Secured With Tape Notes normal saline to moisten collagen Electronic Signature(s) Signed: 11/28/2020 5:34:45 PM By: Deon Pilling Entered By: Deon Pilling on 11/28/2020 14:28:48 -------------------------------------------------------------------------------- Vitals Details Patient Name: Date of Service: [name], JO HN S. 11/28/2020 1:15 PM Medical Record Number: [id number] Patient Account Number:  [id number] Date of Birth/Sex: Treating RN: January 27, 1961 (59 y.o. Lorette Ang, Meta.Reding Primary Care Javonta Gronau: PA TIENT, NO Other Clinician: Referring Pearlie Lafosse: Treating Keeven Matty/Extender: Worthy Keeler PA TWA RDHA N, MA NISH Weeks in Treatment: 0 Vital Signs Time Taken: 13:48 Temperature (F): 97.5 Height (in): 72 Pulse (bpm): 87 Source: Stated Respiratory Rate (breaths/min): 18 Weight (lbs): 135 Blood Pressure (mmHg): 126/79 Source: Stated Reference Range: 80 - 120 mg / dl Body Mass Index (BMI): 18.3 Electronic Signature(s) Signed: 11/28/2020 5:34:45 PM By: Deon Pilling Entered By: Deon Pilling on 11/28/2020 13:51:20

## 2020-11-28 NOTE — Progress Notes (Signed)
Mike [name] ([id number]) Visit Report for 11/28/2020 Chief Complaint Document Details Patient Name: Date of Service: Mike [name]. 11/28/2020 1:15 PM Medical Record Number: [id number] Patient Account Number: [id number] Date of Birth/Sex: Treating RN: [birth year] (59 y.o. [name] Primary Care Provider: PA Haig Prophet, NO Other Clinician: Referring Provider: Treating Provider/Extender: Worthy Keeler PA TWA Sasser, MA NISH Weeks in Treatment: 0 Information Obtained from: Patient Chief Complaint Left gluteal pressure ulcer Electronic Signature(s) Signed: 11/28/2020 2:28:17 PM By: Worthy Keeler PA-C Entered By: Worthy Keeler on 11/28/2020 14:28:16 -------------------------------------------------------------------------------- HPI Details Patient Name: Date of Service: STRICKLA ND, JO HN S. 11/28/2020 1:15 PM Medical Record Number: [id number] Patient Account Number: [id number] Date of Birth/Sex: Treating RN: [birth year] (59 y.o. [name] Primary Care Provider: PA Haig Prophet, NO Other Clinician: Referring Provider: Treating Provider/Extender: Worthy Keeler PA TWA RDHA N, MA NISH Weeks in Treatment: 0 History of Present Illness HPI Description: 11/28/2020 patient presents for initial evaluation here in our clinic concerning issues that he has been having with wounds actually in the left gluteal region. These occurred following his surgery and he actually had surgery which included a left above-knee amputation. Unfortunately this was necessitated by the fact that the patient had a fairly complicated recent vascular and cardiac history including a STEMI on 08/17/2020. This was complicated by cardiac arrest secondary to chronic ST elevations in the inferior leads, severe bilateral renal artery stenosis, peripheral arterial disease with critical limb ischemia requiring right iliac revascularization of the left lower extremity, chronic congestive heart failure systolic with  ischemic cardiomyopathy with complaints of shortness of breath, decreased ability to walk due to severe leg pain and again the patient had acute critical limb ischemia. He was too high of a risk being that he was on aspirin and Brilinta due to the recent STEMI and therefore was not a candidate for revascularization. Because he was decompensating so quickly it was elected to do an above-knee amputation to treat the progressing gangrene prior to this worsening significantly. This was done up by Dr. Donnetta Hutching on November 06, 2020. He did have a Foley catheter during the time that he was in the hospital. He was there from October 30, 2020 through November 15, 2020. It was during this time that he developed the wounds in the gluteal region on the left. The wounds appear to be in somewhat of a linear pattern that makes me in the suspect that he may have developed these as a result of having sat on a tubing such as a catheter tubing or something of the sort for too long causing a pressure injury although I cannot know that for sure. Not this far out. Currently the patient is on aspirin and Plavix and is currently in skilled nursing at [facility]. Electronic Signature(s) Signed: 11/28/2020 4:29:30 PM By: Worthy Keeler PA-C Entered By: Worthy Keeler on 11/28/2020 16:29:30 -------------------------------------------------------------------------------- Physical Exam Details Patient Name: Date of Service: Mike [name]. 11/28/2020 1:15 PM Medical Record Number: [id number] Patient Account Number: [id number] Date of Birth/Sex: Treating RN: 10-22-1961 (59 y.o. [name] Primary Care Provider: PA Haig Prophet, NO Other Clinician: Referring Provider: Treating Provider/Extender: Worthy Keeler PA TWA RDHA N, MA NISH Weeks in Treatment: 0 Constitutional sitting or standing blood pressure is within target range for patient.. pulse regular and within target range for patient.Marland Kitchen respirations regular,  non-labored and within target range for patient.Marland Kitchen temperature within target range for patient.. Well-nourished and well-hydrated  in no acute distress. Eyes conjunctiva clear no eyelid edema noted. pupils equal round and reactive to light and accommodation. Ears, Nose, Mouth, and Throat no gross abnormality of ear auricles or external auditory canals. normal hearing noted during conversation. mucus membranes moist. Respiratory normal breathing without difficulty. Cardiovascular 1+ dorsalis pedis/posterior tibialis pulses right LE. no clubbing, cyanosis, significant edema, <3 sec cap refill right LE. Musculoskeletal Patient unable to walk Secondary to left above-knee amputation.Marland Kitchen Psychiatric this patient is able to make decisions and demonstrates good insight into disease process. Alert and Oriented x 3. pleasant and cooperative. Notes Upon inspection patient's wounds actually appear to be fairly minimal and not really too deep he has a scattered almost linear path of wounds currently in the left gluteal region which are not too deep and I think will likely do well with appropriate treatment. I think a collagen-based dressing would be appropriate in this case. Electronic Signature(s) Signed: 11/28/2020 4:30:25 PM By: Worthy Keeler PA-C Entered By: Worthy Keeler on 11/28/2020 16:30:24 -------------------------------------------------------------------------------- Physician Orders Details Patient Name: Date of Service: [name] 11/28/2020 1:15 PM Medical Record Number: [id number] Patient Account Number: [id number] Date of Birth/Sex: Treating RN: [birth year] (59 y.o. [name] Primary Care Provider: PA Haig Prophet, NO Other Clinician: Referring Provider: Treating Provider/Extender: Worthy Keeler PA TWA RDHA N, MA NISH Weeks in Treatment: 0 Verbal / Phone Orders: No Diagnosis Coding ICD-10 Coding Code Description [phone] Pressure ulcer of left buttock, stage 3 I25.10  Atherosclerotic heart disease of native coronary artery without angina pectoris I73.89 Other specified peripheral vascular diseases I10 Essential (primary) hypertension Follow-up Appointments Return Appointment in 2 weeks. Dressing Change Frequency Wound #1 Left,Proximal Gluteus Change Dressing every other day. - or as needed if soiled Wound #2 Left,Distal Gluteus Change Dressing every other day. - or as needed if soiled Wound Cleansing May shower and wash wound with soap and water. - with dressing changes Primary Wound Dressing Wound #1 Left,Proximal Gluteus Silver Collagen Wound #2 Left,Distal Gluteus Silver Collagen Secondary Dressing Wound #1 Left,Proximal Gluteus Foam Border - or gauze secured with tape Wound #2 Left,Distal Gluteus Foam Border - or gauze secured with tape Off-Loading Turn and reposition every 2 hours - Need to stand, lift up with arms or reposition every 1-2 hours while sitting in chair Additional Orders / Instructions Follow Nutritious Diet - increase protein intake Electronic Signature(s) Signed: 11/28/2020 4:34:22 PM By: Worthy Keeler PA-C Signed: 11/28/2020 5:21:24 PM By: Baruch Gouty RN, [name] Entered By: Baruch Gouty on 11/28/2020 14:46:15 -------------------------------------------------------------------------------- Problem List Details Patient Name: Date of Service: [name] 11/28/2020 1:15 PM Medical Record Number: [id number] Patient Account Number: [id number] Date of Birth/Sex: Treating RN: 1961/09/26 (59 y.o. [name] Primary Care Provider: PA Haig Prophet, NO Other Clinician: Referring Provider: Treating Provider/Extender: Worthy Keeler PA TWA RDHA N, MA NISH Weeks in Treatment: 0 Active Problems ICD-10 Encounter Code Description Active Date MDM Diagnosis [phone] Pressure ulcer of left buttock, stage 3 11/28/2020 No Yes I25.10 Atherosclerotic heart disease of native coronary artery without angina pectoris  11/28/2020 No Yes I73.89 Other specified peripheral vascular diseases 11/28/2020 No Yes I10 Essential (primary) hypertension 11/28/2020 No Yes Inactive Problems Resolved Problems Electronic Signature(s) Signed: 11/28/2020 2:23:39 PM By: Worthy Keeler PA-C Entered By: Worthy Keeler on 11/28/2020 14:23:38 -------------------------------------------------------------------------------- Progress Note Details Patient Name: Date of Service: [name] 11/28/2020 1:15 PM Medical Record Number: [id number] Patient Account Number: [id number] Date of Birth/Sex:  Treating RN: [birth year] (59 y.o. [name] Primary Care Provider: PA Haig Prophet, NO Other Clinician: Referring Provider: Treating Provider/Extender: Worthy Keeler PA TWA RDHA N, MA NISH Weeks in Treatment: 0 Subjective Chief Complaint Information obtained from Patient Left gluteal pressure ulcer History of Present Illness (HPI) 11/28/2020 patient presents for initial evaluation here in our clinic concerning issues that he has been having with wounds actually in the left gluteal region. These occurred following his surgery and he actually had surgery which included a left above-knee amputation. Unfortunately this was necessitated by the fact that the patient had a fairly complicated recent vascular and cardiac history including a STEMI on 08/17/2020. This was complicated by cardiac arrest secondary to chronic ST elevations in the inferior leads, severe bilateral renal artery stenosis, peripheral arterial disease with critical limb ischemia requiring right iliac revascularization of the left lower extremity, chronic congestive heart failure systolic with ischemic cardiomyopathy with complaints of shortness of breath, decreased ability to walk due to severe leg pain and again the patient had acute critical limb ischemia. He was too high of a risk being that he was on aspirin and Brilinta due to the recent STEMI and therefore was  not a candidate for revascularization. Because he was decompensating so quickly it was elected to do an above-knee amputation to treat the progressing gangrene prior to this worsening significantly. This was done up by Dr. Donnetta Hutching on November 06, 2020. He did have a Foley catheter during the time that he was in the hospital. He was there from October 30, 2020 through November 15, 2020. It was during this time that he developed the wounds in the gluteal region on the left. The wounds appear to be in somewhat of a linear pattern that makes me in the suspect that he may have developed these as a result of having sat on a tubing such as a catheter tubing or something of the sort for too long causing a pressure injury although I cannot know that for sure. Not this far out. Currently the patient is on aspirin and Plavix and is currently in skilled nursing at [facility]. Patient History Information obtained from Patient. Allergies No Known Drug Allergies Family History Diabetes - Father, Heart Disease - Mother,Father,Maternal Grandparents,Paternal Grandparents, Hypertension - Paternal Grandparents,Maternal Grandparents,Father,Mother, Lung Disease - Father,Mother, No family history of Hereditary Spherocytosis, Kidney Disease, Seizures, Stroke, Thyroid Problems, Tuberculosis. Social History Former smoker - quit 08/01/2020, Marital Status - Divorced, Alcohol Use - Never, Drug Use - No History, Caffeine Use - Daily - soda. Medical History Eyes Denies history of Cataracts, Glaucoma, Optic Neuritis Ear/Nose/Mouth/Throat Denies history of Chronic sinus problems/congestion, Middle ear problems Hematologic/Lymphatic Denies history of Anemia, Hemophilia, Human Immunodeficiency Virus, Lymphedema, Sickle Cell Disease Respiratory Patient has history of Sleep Apnea - no CPAP Denies history of Aspiration, Asthma, Chronic Obstructive Pulmonary Disease (COPD), Pneumothorax Cardiovascular Patient has history of  Coronary Artery Disease - x5 stents-x3 heart/x2 leg, Hypotension, Myocardial Infarction - 07/2020, Peripheral Arterial Disease Denies history of Angina, Arrhythmia, Congestive Heart Failure, Deep Vein Thrombosis, Hypertension, Peripheral Venous Disease, Phlebitis, Vasculitis Gastrointestinal Denies history of Cirrhosis , Colitis, Crohnoos, Hepatitis A, Hepatitis B, Hepatitis C Endocrine Denies history of Type I Diabetes, Type II Diabetes Genitourinary Denies history of End Stage Renal Disease Immunological Denies history of Lupus Erythematosus, Raynaudoos, Scleroderma Integumentary (Skin) Denies history of History of Burn Musculoskeletal Denies history of Gout, Rheumatoid Arthritis, Osteoarthritis, Osteomyelitis Neurologic Denies history of Dementia, Neuropathy, Quadriplegia, Paraplegia, Seizure Disorder Oncologic Denies history of  Received Chemotherapy, Received Radiation Psychiatric Denies history of Anorexia/bulimia, Confinement Anxiety Hospitalization/Surgery History - left AKA 119//2021. - [place] 08/01/2020. Medical A Surgical History Notes nd Musculoskeletal left AKA 11/9 Review of Systems (ROS) Constitutional Symptoms (General Health) Denies complaints or symptoms of Fatigue, Fever, Chills, Marked Weight Change. Eyes Complains or has symptoms of Glasses / Contacts - readers. Denies complaints or symptoms of Dry Eyes, Vision Changes. Ear/Nose/Mouth/Throat Denies complaints or symptoms of Chronic sinus problems or rhinitis. Respiratory Denies complaints or symptoms of Chronic or frequent coughs, Shortness of Breath. Cardiovascular Denies complaints or symptoms of Chest pain. Gastrointestinal Denies complaints or symptoms of Frequent diarrhea, Nausea, Vomiting. Endocrine Denies complaints or symptoms of Heat/cold intolerance. Genitourinary Denies complaints or symptoms of Frequent urination. Integumentary (Skin) Complains or has symptoms of Wounds - currently  buttock. Musculoskeletal Denies complaints or symptoms of Muscle Pain, Muscle Weakness. Neurologic Denies complaints or symptoms of Numbness/parasthesias. Psychiatric Denies complaints or symptoms of Claustrophobia, Suicidal. Objective Constitutional sitting or standing blood pressure is within target range for patient.. pulse regular and within target range for patient.Marland Kitchen respirations regular, non-labored and within target range for patient.Marland Kitchen temperature within target range for patient.. Well-nourished and well-hydrated in no acute distress. Vitals Time Taken: 1:48 PM, Height: 72 in, Source: Stated, Weight: 135 lbs, Source: Stated, BMI: 18.3, Temperature: 97.5 F, Pulse: 87 bpm, Respiratory Rate: 18 breaths/min, Blood Pressure: 126/79 mmHg. Eyes conjunctiva clear no eyelid edema noted. pupils equal round and reactive to light and accommodation. Ears, Nose, Mouth, and Throat no gross abnormality of ear auricles or external auditory canals. normal hearing noted during conversation. mucus membranes moist. Respiratory normal breathing without difficulty. Cardiovascular 1+ dorsalis pedis/posterior tibialis pulses right LE. no clubbing, cyanosis, significant edema, Musculoskeletal Patient unable to walk Secondary to left above-knee amputation.Marland Kitchen Psychiatric this patient is able to make decisions and demonstrates good insight into disease process. Alert and Oriented x 3. pleasant and cooperative. General Notes: Upon inspection patient's wounds actually appear to be fairly minimal and not really too deep he has a scattered almost linear path of wounds currently in the left gluteal region which are not too deep and I think will likely do well with appropriate treatment. I think a collagen-based dressing would be appropriate in this case. Integumentary (Hair, Skin) Wound #1 status is Open. Original cause of wound was Pressure Injury. The wound is located on the Left,Proximal Gluteus. The wound  measures 0.3cm length x 0.4cm width x 0.1cm depth; 0.094cm^2 area and 0.009cm^3 volume. There is Fat Layer (Subcutaneous Tissue) exposed. There is no tunneling or undermining noted. There is a medium amount of serosanguineous drainage noted. The wound margin is distinct with the outline attached to the wound base. There is large (67-100%) red, pink granulation within the wound bed. There is no necrotic tissue within the wound bed. Wound #2 status is Open. Original cause of wound was Pressure Injury. The wound is located on the Left,Distal Gluteus. The wound measures 0.6cm length x 0.3cm width x 0.1cm depth; 0.141cm^2 area and 0.014cm^3 volume. There is Fat Layer (Subcutaneous Tissue) exposed. There is no tunneling or undermining noted. There is a medium amount of serosanguineous drainage noted. The wound margin is distinct with the outline attached to the wound base. There is large (67-100%) red, pink granulation within the wound bed. There is a small (1-33%) amount of necrotic tissue within the wound bed including Adherent Slough. Assessment Active Problems ICD-10 Pressure ulcer of left buttock, stage 3 Atherosclerotic heart disease of native coronary artery without  angina pectoris Other specified peripheral vascular diseases Essential (primary) hypertension Plan Follow-up Appointments: Return Appointment in 2 weeks. Dressing Change Frequency: Wound #1 Left,Proximal Gluteus: Change Dressing every other day. - or as needed if soiled Wound #2 Left,Distal Gluteus: Change Dressing every other day. - or as needed if soiled Wound Cleansing: May shower and wash wound with soap and water. - with dressing changes Primary Wound Dressing: Wound #1 Left,Proximal Gluteus: Silver Collagen Wound #2 Left,Distal Gluteus: Silver Collagen Secondary Dressing: Wound #1 Left,Proximal Gluteus: Foam Border - or gauze secured with tape Wound #2 Left,Distal Gluteus: Foam Border - or gauze secured with  tape Off-Loading: Turn and reposition every 2 hours - Need to stand, lift up with arms or reposition every 1-2 hours while sitting in chair Additional Orders / Instructions: Follow Nutritious Diet - increase protein intake 1. I would recommend at this time that we actually go ahead and initiate treatment currently with a silver collagen dressing to the open wound locations along with a border foam dressing to cover I think this will help with allowing this to epithelialize and hopefully close more quickly. 2. I am also can recommend appropriate offloading repositioning every 1-2 hours and using his arms to lift up and allow some blood flow if he sitting in his chair this will also help build up the strength in his arms as far as helping him to move around to get around until he gets his prosthesis. 3. I am also can recommend that he increase protein intake but discussion also was fairly think the lead discussed with him today. The patient voiced understanding. His sister was present during the entire evaluation today as well. We will see patient back for reevaluation in 2 weeks here in the clinic. If anything worsens or changes patient will contact our office for additional recommendations. Electronic Signature(s) Signed: 11/28/2020 4:33:44 PM By: Worthy Keeler PA-C Entered By: Worthy Keeler on 11/28/2020 16:33:44 -------------------------------------------------------------------------------- HxROS Details Patient Name: Date of Service: STRICKLA ND, JO HN S. 11/28/2020 1:15 PM Medical Record Number: [id number] Patient Account Number: [id number] Date of Birth/Sex: Treating RN: 1961-07-20 (59 y.o. [name] Primary Care Provider: PA Haig Prophet, NO Other Clinician: Referring Provider: Treating Provider/Extender: Worthy Keeler PA TWA RDHA N, MA NISH Weeks in Treatment: 0 Information Obtained From Patient Constitutional Symptoms (General Health) Complaints and Symptoms: Negative for:  Fatigue; Fever; Chills; Marked Weight Change Eyes Complaints and Symptoms: Positive for: Glasses / Contacts - readers Negative for: Dry Eyes; Vision Changes Medical History: Negative for: Cataracts; Glaucoma; Optic Neuritis Ear/Nose/Mouth/Throat Complaints and Symptoms: Negative for: Chronic sinus problems or rhinitis Medical History: Negative for: Chronic sinus problems/congestion; Middle ear problems Respiratory Complaints and Symptoms: Negative for: Chronic or frequent coughs; Shortness of Breath Medical History: Positive for: Sleep Apnea - no CPAP Negative for: Aspiration; Asthma; Chronic Obstructive Pulmonary Disease (COPD); Pneumothorax Cardiovascular Complaints and Symptoms: Negative for: Chest pain Medical History: Positive for: Coronary Artery Disease - x5 stents-x3 heart/x2 leg; Hypotension; Myocardial Infarction - 07/2020; Peripheral Arterial Disease Negative for: Angina; Arrhythmia; Congestive Heart Failure; Deep Vein Thrombosis; Hypertension; Peripheral Venous Disease; Phlebitis; Vasculitis Gastrointestinal Complaints and Symptoms: Negative for: Frequent diarrhea; Nausea; Vomiting Medical History: Negative for: Cirrhosis ; Colitis; Crohns; Hepatitis A; Hepatitis B; Hepatitis C Endocrine Complaints and Symptoms: Negative for: Heat/cold intolerance Medical History: Negative for: Type I Diabetes; Type II Diabetes Genitourinary Complaints and Symptoms: Negative for: Frequent urination Medical History: Negative for: End Stage Renal Disease Integumentary (Skin) Complaints and Symptoms: Positive for:  Wounds - currently buttock Medical History: Negative for: History of Burn Musculoskeletal Complaints and Symptoms: Negative for: Muscle Pain; Muscle Weakness Medical History: Negative for: Gout; Rheumatoid Arthritis; Osteoarthritis; Osteomyelitis Past Medical History Notes: left AKA 11/9 Neurologic Complaints and Symptoms: Negative for:  Numbness/parasthesias Medical History: Negative for: Dementia; Neuropathy; Quadriplegia; Paraplegia; Seizure Disorder Psychiatric Complaints and Symptoms: Negative for: Claustrophobia; Suicidal Medical History: Negative for: Anorexia/bulimia; Confinement Anxiety Hematologic/Lymphatic Medical History: Negative for: Anemia; Hemophilia; Human Immunodeficiency Virus; Lymphedema; Sickle Cell Disease Immunological Medical History: Negative for: Lupus Erythematosus; Raynauds; Scleroderma Oncologic Medical History: Negative for: Received Chemotherapy; Received Radiation Immunizations Pneumococcal Vaccine: Received Pneumococcal Vaccination: No Implantable Devices None Hospitalization / Surgery History Type of Hospitalization/Surgery left AKA 119//2021 [place] 08/01/2020 Family and Social History Diabetes: Yes - Father; Heart Disease: Yes - Mother,Father,Maternal Grandparents,Paternal Grandparents; Hereditary Spherocytosis: No; Hypertension: Yes - Paternal Grandparents,Maternal Grandparents,Father,Mother; Kidney Disease: No; Lung Disease: Yes - Father,Mother; Seizures: No; Stroke: No; Thyroid Problems: No; Tuberculosis: No; Former smoker - quit 08/01/2020; Marital Status - Divorced; Alcohol Use: Never; Drug Use: No History; Caffeine Use: Daily - soda; Financial Concerns: No; Food, Clothing or Shelter Needs: No; Support System Lacking: No; Transportation Concerns: No Electronic Signature(s) Signed: 11/28/2020 4:34:22 PM By: Worthy Keeler PA-C Signed: 11/28/2020 5:34:45 PM By: Deon Pilling Entered By: Deon Pilling on 11/28/2020 14:03:44 -------------------------------------------------------------------------------- SuperBill Details Patient Name: Date of Service: [name] 11/28/2020 Medical Record Number: [id number] Patient Account Number: [id number] Date of Birth/Sex: Treating RN: 1961/09/01 (59 y.o. [name] Primary Care Provider: PA Haig Prophet, NO Other  Clinician: Referring Provider: Treating Provider/Extender: Worthy Keeler PA TWA RDHA N, MA NISH Weeks in Treatment: 0 Diagnosis Coding ICD-10 Codes Code Description [id number] Pressure ulcer of left buttock, stage 3 I25.10 Atherosclerotic heart disease of native coronary artery without angina pectoris I73.89 Other specified peripheral vascular diseases I10 Essential (primary) hypertension Facility Procedures CPT4 Code: [id number] Description: 99214 - WOUND CARE VISIT-LEV 4 EST PT Modifier: Quantity: 1 Physician Procedures : CPT4 Code Description Modifier [id number] WC PHYS LEVEL 3 NEW PT ICD-10 Diagnosis Description L89.323 Pressure ulcer of left buttock, stage 3 I25.10 Atherosclerotic heart disease of native coronary artery without angina pectoris I73.89 Other specified  peripheral vascular diseases I10 Essential (primary) hypertension Quantity: 1 Electronic Signature(s) Signed: 11/28/2020 4:34:01 PM By: Worthy Keeler PA-C Entered By: Worthy Keeler on 11/28/2020 16:34:00

## 2020-11-28 NOTE — Progress Notes (Signed)
Mike [name], Mike [name] ([id number]) Visit Report for 11/28/2020 Abuse/Suicide Risk Screen Details Patient Name: Date of Service: Mike [name]. 11/28/2020 1:15 PM Medical Record Number: [id number] Patient Account Number: [id number] Date of Birth/Sex: Treating RN: [birth year] (59 y.o. Mike [name] Primary Care Illeana Edick: PA Haig Prophet, NO Other Clinician: Referring Damoney Julia: Treating Tavonte Seybold/Extender: Worthy Keeler PA TWA RDHA N, MA NISH Weeks in Treatment: 0 Abuse/Suicide Risk Screen Items Answer ABUSE RISK SCREEN: Has anyone close to you tried to hurt or harm you recentlyo No Do you feel uncomfortable with anyone in your familyo No Has anyone forced you do things that you didnt want to doo No Electronic Signature(s) Signed: 11/28/2020 5:34:45 PM By: Deon Pilling Entered By: Deon Pilling on 11/28/2020 13:51:39 -------------------------------------------------------------------------------- Activities of Daily Living Details Patient Name: Date of Service: Mike [name] 11/28/2020 1:15 PM Medical Record Number: [id number] Patient Account Number: [id number] Date of Birth/Sex: Treating RN: May 12, 1961 (59 y.o. Mike [name] Primary Care Bessy Reaney: PA Haig Prophet, NO Other Clinician: Referring Abbiegail Landgren: Treating Dominiqua Cooner/Extender: Worthy Keeler PA TWA RDHA N, MA NISH Weeks in Treatment: 0 Activities of Daily Living Items Answer Activities of Daily Living (Please select one for each item) Drive Automobile Not Able T Medications ake Completely Able Use T elephone Completely Able Care for Appearance Completely Able Use T oilet Need Assistance Bath / Shower Need Assistance Dress Self Completely Able Feed Self Completely Able Walk Not Able Get In / Out Bed Need Assistance Housework Not Able Prepare Meals Not Able Handle Money Need Assistance Shop for Self Not Able Electronic Signature(s) Signed: 11/28/2020 5:34:45 PM By: Deon Pilling Entered By: Deon Pilling on 11/28/2020  13:52:57 -------------------------------------------------------------------------------- Education Screening Details Patient Name: Date of Service: Mike Dike HN S. 11/28/2020 1:15 PM Medical Record Number: [id number] Patient Account Number: [id number] Date of Birth/Sex: Treating RN: [birth year] (59 y.o. Mike [name] Primary Care Reiner Loewen: PA Haig Prophet, NO Other Clinician: Referring Cydnie Deason: Treating Mikyla Schachter/Extender: Worthy Keeler PA TWA RDHA N, MA NISH Weeks in Treatment: 0 Primary Learner Assessed: Patient Learning Preferences/Education Level/Primary Language Learning Preference: Explanation, Demonstration, Printed Material Highest Education Level: College or Above Preferred Language: English Cognitive Barrier Language Barrier: No Translator Needed: No Memory Deficit: No Emotional Barrier: No Cultural/Religious Beliefs Affecting Medical Care: No Physical Barrier Impaired Vision: Yes Glasses, reading Impaired Hearing: No Decreased Hand dexterity: No Knowledge/Comprehension Knowledge Level: High Comprehension Level: High Ability to understand written instructions: High Ability to understand verbal instructions: High Motivation Anxiety Level: Calm Cooperation: Cooperative Education Importance: Acknowledges Need Interest in Health Problems: Asks Questions Perception: Coherent Willingness to Engage in Self-Management High Activities: Readiness to Engage in Self-Management High Activities: Electronic Signature(s) Signed: 11/28/2020 5:34:45 PM By: Deon Pilling Entered By: Deon Pilling on 11/28/2020 13:53:29 -------------------------------------------------------------------------------- Fall Risk Assessment Details Patient Name: Date of Service: Mike Dike HN S. 11/28/2020 1:15 PM Medical Record Number: [id number] Patient Account Number: [id number] Date of Birth/Sex: Treating RN: 09-Nov-1961 (59 y.o. Mike [name], Mike [name] Primary Care Trendon Zaring: PA TIENT,  NO Other Clinician: Referring Hali Balgobin: Treating Tayshawn Purnell/Extender: Worthy Keeler PA TWA RDHA N, MA NISH Weeks in Treatment: 0 Fall Risk Assessment Items Have you had 2 or more falls in the last 12 monthso 0 No Have you had any fall that resulted in injury in the last 12 monthso 0 No FALLS RISK SCREEN History of falling - immediate or within 3 months 0 No Secondary diagnosis (Do you have 2 or more medical diagnoseso) 0 No Ambulatory aid None/bed rest/wheelchair/nurse  0 Yes Crutches/cane/walker 0 No Furniture 0 No Intravenous therapy Access/Saline/Heparin Lock 0 No Gait/Transferring Normal/ bed rest/ wheelchair 0 Yes Weak (short steps with or without shuffle, stooped but able to lift head while walking, may seek 0 No support from furniture) Impaired (short steps with shuffle, may have difficulty arising from chair, head down, impaired 0 No balance) Mental Status Oriented to own ability 0 No Electronic Signature(s) Signed: 11/28/2020 5:34:45 PM By: Deon Pilling Entered By: Deon Pilling on 11/28/2020 13:53:42 -------------------------------------------------------------------------------- Foot Assessment Details Patient Name: Date of Service: Mike Dike HN S. 11/28/2020 1:15 PM Medical Record Number: [id number] Patient Account Number: [id number] Date of Birth/Sex: Treating RN: 10/16/1961 (59 y.o. Mike [name] Primary Care Mahek Schlesinger: PA TIENT, NO Other Clinician: Referring Emmalin Jaquess: Treating Sierra Spargo/Extender: Worthy Keeler PA TWA RDHA N, MA NISH Weeks in Treatment: 0 Foot Assessment Items Site Locations + = Sensation present, - = Sensation absent, C = Callus, U = Ulcer R = Redness, W = Warmth, M = Maceration, PU = Pre-ulcerative lesion F = Fissure, S = Swelling, D = Dryness Assessment Right: Left: Other Deformity: No No Prior Foot Ulcer: No No Prior Amputation: No No Charcot Joint: No No Ambulatory Status: Gait: Notes no BLE wounds. Electronic  Signature(s) Signed: 11/28/2020 5:34:45 PM By: Deon Pilling Entered By: Deon Pilling on 11/28/2020 13:54:32 -------------------------------------------------------------------------------- Nutrition Risk Screening Details Patient Name: Date of Service: Mike [name] 11/28/2020 1:15 PM Medical Record Number: [id number] Patient Account Number: [id number] Date of Birth/Sex: Treating RN: 1961/05/21 (59 y.o. Mike [name], Mike [name] Primary Care Randalyn Ahmed: PA TIENT, NO Other Clinician: Referring Clella Mckeel: Treating Jeanne Terrance/Extender: Worthy Keeler PA TWA RDHA N, MA NISH Weeks in Treatment: 0 Height (in): 72 Weight (lbs): 135 Body Mass Index (BMI): 18.3 Nutrition Risk Screening Items Score Screening NUTRITION RISK SCREEN: I have an illness or condition that made me change the kind and/or amount of food I eat 2 Yes I eat fewer than two meals per day 0 No I eat few fruits and vegetables, or milk products 0 No I have three or more drinks of beer, liquor or wine almost every day 0 No I have tooth or mouth problems that make it hard for me to eat 0 No I don't always have enough money to buy the food I need 0 No I eat alone most of the time 0 No I take three or more different prescribed or over-the-counter drugs a day 1 Yes Without wanting to, I have lost or gained 10 pounds in the last six months 0 No I am not always physically able to shop, cook and/or feed myself 0 No Nutrition Protocols Good Risk Protocol Moderate Risk Protocol 0 Provide education on nutrition High Risk Proctocol Risk Level: Moderate Risk Score: 3 Electronic Signature(s) Signed: 11/28/2020 5:34:45 PM By: Deon Pilling Entered By: Deon Pilling on 11/28/2020 13:54:23

## 2020-11-29 DIAGNOSIS — R21 Rash and other nonspecific skin eruption: Secondary | ICD-10-CM | POA: Diagnosis not present

## 2020-11-29 DIAGNOSIS — R601 Generalized edema: Secondary | ICD-10-CM | POA: Diagnosis not present

## 2020-11-29 DIAGNOSIS — Z89612 Acquired absence of left leg above knee: Secondary | ICD-10-CM | POA: Diagnosis not present

## 2020-11-29 DIAGNOSIS — L309 Dermatitis, unspecified: Secondary | ICD-10-CM | POA: Diagnosis not present

## 2020-12-07 ENCOUNTER — Other Ambulatory Visit: Payer: Self-pay

## 2020-12-07 ENCOUNTER — Ambulatory Visit (INDEPENDENT_AMBULATORY_CARE_PROVIDER_SITE_OTHER): Payer: Self-pay | Admitting: Physician Assistant

## 2020-12-07 VITALS — BP 129/75 | HR 90 | Temp 98.1°F | Resp 20 | Ht 72.0 in

## 2020-12-07 DIAGNOSIS — I739 Peripheral vascular disease, unspecified: Secondary | ICD-10-CM

## 2020-12-07 NOTE — Progress Notes (Signed)
POST OPERATIVE OFFICE NOTE    CC:  F/u for surgery  HPI:  This is a 59 y.o. male who is s/p AKA on 11/06/20  by Dr. Donnetta Hutching.  He has an irritated area with scab along the medial AKA incision.  He denise pain and drainage from the are.  He states things get caught on the staples.  He denise fever and chills.  He is followed by out patient wound care and has an appointment next week.    He is followed by Dr. Virgina Jock [facility] for his right LE 10/16/2020 s/p  Successful intravascular lithotripsy, PTCA and stenting     7.0X39 mm balloon expandable Viabahn VBX stent Rt common iliac artery      7.0X60 mm self expanding Absolute Pro stent Rt distal iliac artery   Pt returns today for follow up.    Allergies  Allergen Reactions  . Chlorhexidine     Current Outpatient Medications  Medication Sig Dispense Refill  . aspirin 81 MG EC tablet Take 1 tablet (81 mg total) by mouth daily. Swallow whole. 90 tablet 3  . clopidogrel (PLAVIX) 75 MG tablet Take 1 tablet (75 mg total) by mouth daily. 30 tablet 0  . collagenase (SANTYL) ointment Apply topically daily. 15 g 0  . ENTRESTO 49-51 MG Take 1 tablet by mouth 2 (two) times daily.    Marland Kitchen gabapentin (NEURONTIN) 300 MG capsule Take 1 capsule (300 mg total) by mouth 2 (two) times daily. 60 capsule 0  . liver oil-zinc oxide (DESITIN) 40 % ointment Apply 1 application topically as needed for irritation.    . metroNIDAZOLE (FLAGYL) 500 MG tablet Take 1 tablet (500 mg total) by mouth every 8 (eight) hours. 13 tablet 0  . nicotine (NICODERM CQ - DOSED IN MG/24 HR) 7 mg/24hr patch Place 1 patch (7 mg total) onto the skin daily. 30 patch 1  . Nystatin (GERHARDT'S BUTT CREAM) CREA Apply 1 application topically 2 (two) times daily. 60 each 0  . rosuvastatin (CRESTOR) 10 MG tablet Take 1 tablet (10 mg total) by mouth daily. 30 tablet 0  . sulfamethoxazole-trimethoprim (BACTRIM DS) 800-160 MG tablet Take 1 tablet by mouth every 12 (twelve) hours. 9 tablet 0   . nitroGLYCERIN (NITROSTAT) 0.4 MG SL tablet Place 1 tablet (0.4 mg total) under the tongue every 5 (five) minutes as needed for chest pain. 30 tablet 1   No current facility-administered medications for this visit.     ROS:  See HPI  Physical Exam:  Today's Vitals   12/07/20 0943  BP: 129/75  Pulse: 90  Resp: 20  Temp: 98.1 F (36.7 C)  TempSrc: Temporal  SpO2: 99%  Height: 6' (1.829 m)  PainSc: 2    Body mass index is 19.67 kg/m.   Incision:  Stump is warm incision without opening or drainage.  Medial scab formation with staple irritation and mild erythema. Extremities:  Right LE without wounds, warm to touch and doppler signals monophasic PT/peroneal Lungs non labored breathing   Assessment/Plan:  This is a 59 y.o. male who is s/p:Left AKA by Dr. Donnetta Hutching 11/06/20   Staples were removed today.  He will wash with soap and water daily.  He will be ready for prosthetic advancement once the medial incision has healed.  He has a retention sock provided by [employer].  I have advised them to call for a follow up appointment after the holidays and once the scab has healed to make an appointment with them.  He  will f/u with Dr. Virgina Jock for right LE surveillance.  If there is a need for vascular intervention he will let us know.  Cont. ASA, Plavix and Statin.  Roxy Horseman PA-C [facility] [phone]  Clinic MD:  Donzetta Matters  The pt has a Above Knee Amputation The patient requires a prosthesis to improve their mobility and has the ability to function with the prosthesis to maintain a healthy lifestyle and perform ADLs. The patient is a new amputee and thus he does not have a prosthesis. The patient verbally communicates a strong desire to get a prosthesis. The patient is currently using a walker to hop around and a wheelchair for mobility aids and is not expected to do so with the new prosthesis.  Patient is a functional K2 level ambulator. They hop around  their house with their walker, do single leg sit and stands, and is exhibiting strength, motivation, potential and ability to ambulate with a walking aid safely and independently.

## 2020-12-11 ENCOUNTER — Encounter (HOSPITAL_COMMUNITY): Payer: Self-pay | Admitting: Emergency Medicine

## 2020-12-11 ENCOUNTER — Inpatient Hospital Stay (HOSPITAL_COMMUNITY): Payer: BC Managed Care – PPO

## 2020-12-11 ENCOUNTER — Emergency Department (HOSPITAL_COMMUNITY): Payer: BC Managed Care – PPO

## 2020-12-11 ENCOUNTER — Inpatient Hospital Stay (HOSPITAL_COMMUNITY)
Admission: EM | Admit: 2020-12-11 | Discharge: 2020-12-14 | DRG: 062 | Disposition: A | Discharge status: Home Health | Payer: BC Managed Care – PPO | Attending: Neurology | Admitting: Neurology

## 2020-12-11 ENCOUNTER — Other Ambulatory Visit: Payer: Self-pay

## 2020-12-11 DIAGNOSIS — I5022 Chronic systolic (congestive) heart failure: Secondary | ICD-10-CM | POA: Diagnosis present

## 2020-12-11 DIAGNOSIS — Z888 Allergy status to other drugs, medicaments and biological substances status: Secondary | ICD-10-CM

## 2020-12-11 DIAGNOSIS — Z20822 Contact with and (suspected) exposure to covid-19: Secondary | ICD-10-CM | POA: Diagnosis not present

## 2020-12-11 DIAGNOSIS — R29810 Facial weakness: Secondary | ICD-10-CM | POA: Diagnosis present

## 2020-12-11 DIAGNOSIS — I6340 Cerebral infarction due to embolism of unspecified cerebral artery: Secondary | ICD-10-CM | POA: Diagnosis not present

## 2020-12-11 DIAGNOSIS — I672 Cerebral atherosclerosis: Secondary | ICD-10-CM | POA: Diagnosis not present

## 2020-12-11 DIAGNOSIS — R4781 Slurred speech: Secondary | ICD-10-CM | POA: Diagnosis not present

## 2020-12-11 DIAGNOSIS — Z23 Encounter for immunization: Secondary | ICD-10-CM

## 2020-12-11 DIAGNOSIS — R4182 Altered mental status, unspecified: Secondary | ICD-10-CM | POA: Diagnosis not present

## 2020-12-11 DIAGNOSIS — F17210 Nicotine dependence, cigarettes, uncomplicated: Secondary | ICD-10-CM | POA: Diagnosis present

## 2020-12-11 DIAGNOSIS — D649 Anemia, unspecified: Secondary | ICD-10-CM | POA: Diagnosis not present

## 2020-12-11 DIAGNOSIS — I6330 Cerebral infarction due to thrombosis of unspecified cerebral artery: Secondary | ICD-10-CM | POA: Insufficient documentation

## 2020-12-11 DIAGNOSIS — R339 Retention of urine, unspecified: Secondary | ICD-10-CM | POA: Diagnosis present

## 2020-12-11 DIAGNOSIS — I6523 Occlusion and stenosis of bilateral carotid arteries: Secondary | ICD-10-CM | POA: Diagnosis not present

## 2020-12-11 DIAGNOSIS — F17200 Nicotine dependence, unspecified, uncomplicated: Secondary | ICD-10-CM

## 2020-12-11 DIAGNOSIS — I429 Cardiomyopathy, unspecified: Secondary | ICD-10-CM | POA: Diagnosis not present

## 2020-12-11 DIAGNOSIS — R29705 NIHSS score 5: Secondary | ICD-10-CM | POA: Diagnosis not present

## 2020-12-11 DIAGNOSIS — I517 Cardiomegaly: Secondary | ICD-10-CM | POA: Diagnosis not present

## 2020-12-11 DIAGNOSIS — Q211 Atrial septal defect: Secondary | ICD-10-CM | POA: Diagnosis not present

## 2020-12-11 DIAGNOSIS — I639 Cerebral infarction, unspecified: Secondary | ICD-10-CM | POA: Diagnosis not present

## 2020-12-11 DIAGNOSIS — I70229 Atherosclerosis of native arteries of extremities with rest pain, unspecified extremity: Secondary | ICD-10-CM | POA: Diagnosis present

## 2020-12-11 DIAGNOSIS — R911 Solitary pulmonary nodule: Secondary | ICD-10-CM | POA: Diagnosis not present

## 2020-12-11 DIAGNOSIS — I280 Arteriovenous fistula of pulmonary vessels: Secondary | ICD-10-CM | POA: Diagnosis not present

## 2020-12-11 DIAGNOSIS — R270 Ataxia, unspecified: Secondary | ICD-10-CM | POA: Diagnosis not present

## 2020-12-11 DIAGNOSIS — I110 Hypertensive heart disease with heart failure: Secondary | ICD-10-CM | POA: Diagnosis present

## 2020-12-11 DIAGNOSIS — Z955 Presence of coronary angioplasty implant and graft: Secondary | ICD-10-CM | POA: Diagnosis not present

## 2020-12-11 DIAGNOSIS — R414 Neurologic neglect syndrome: Secondary | ICD-10-CM | POA: Diagnosis present

## 2020-12-11 DIAGNOSIS — I2510 Atherosclerotic heart disease of native coronary artery without angina pectoris: Secondary | ICD-10-CM | POA: Diagnosis present

## 2020-12-11 DIAGNOSIS — I6789 Other cerebrovascular disease: Secondary | ICD-10-CM | POA: Diagnosis not present

## 2020-12-11 DIAGNOSIS — G8191 Hemiplegia, unspecified affecting right dominant side: Secondary | ICD-10-CM | POA: Diagnosis not present

## 2020-12-11 DIAGNOSIS — I340 Nonrheumatic mitral (valve) insufficiency: Secondary | ICD-10-CM | POA: Diagnosis not present

## 2020-12-11 DIAGNOSIS — R000 Tachycardia, unspecified: Secondary | ICD-10-CM | POA: Diagnosis not present

## 2020-12-11 DIAGNOSIS — I63411 Cerebral infarction due to embolism of right middle cerebral artery: Principal | ICD-10-CM | POA: Diagnosis present

## 2020-12-11 DIAGNOSIS — Z89612 Acquired absence of left leg above knee: Secondary | ICD-10-CM

## 2020-12-11 DIAGNOSIS — I739 Peripheral vascular disease, unspecified: Secondary | ICD-10-CM

## 2020-12-11 DIAGNOSIS — Z79899 Other long term (current) drug therapy: Secondary | ICD-10-CM

## 2020-12-11 DIAGNOSIS — R2990 Unspecified symptoms and signs involving the nervous system: Secondary | ICD-10-CM

## 2020-12-11 DIAGNOSIS — I252 Old myocardial infarction: Secondary | ICD-10-CM

## 2020-12-11 DIAGNOSIS — E785 Hyperlipidemia, unspecified: Secondary | ICD-10-CM | POA: Diagnosis not present

## 2020-12-11 DIAGNOSIS — G8194 Hemiplegia, unspecified affecting left nondominant side: Secondary | ICD-10-CM | POA: Diagnosis not present

## 2020-12-11 DIAGNOSIS — Z66 Do not resuscitate: Secondary | ICD-10-CM | POA: Diagnosis not present

## 2020-12-11 DIAGNOSIS — I25119 Atherosclerotic heart disease of native coronary artery with unspecified angina pectoris: Secondary | ICD-10-CM

## 2020-12-11 DIAGNOSIS — R29818 Other symptoms and signs involving the nervous system: Secondary | ICD-10-CM | POA: Diagnosis not present

## 2020-12-11 DIAGNOSIS — Z8674 Personal history of sudden cardiac arrest: Secondary | ICD-10-CM

## 2020-12-11 DIAGNOSIS — I6389 Other cerebral infarction: Secondary | ICD-10-CM | POA: Diagnosis not present

## 2020-12-11 DIAGNOSIS — Z7902 Long term (current) use of antithrombotics/antiplatelets: Secondary | ICD-10-CM

## 2020-12-11 DIAGNOSIS — R531 Weakness: Secondary | ICD-10-CM | POA: Diagnosis not present

## 2020-12-11 DIAGNOSIS — E782 Mixed hyperlipidemia: Secondary | ICD-10-CM | POA: Diagnosis not present

## 2020-12-11 DIAGNOSIS — I6621 Occlusion and stenosis of right posterior cerebral artery: Secondary | ICD-10-CM | POA: Diagnosis not present

## 2020-12-11 DIAGNOSIS — Z8249 Family history of ischemic heart disease and other diseases of the circulatory system: Secondary | ICD-10-CM

## 2020-12-11 DIAGNOSIS — Z7982 Long term (current) use of aspirin: Secondary | ICD-10-CM

## 2020-12-11 DIAGNOSIS — I6502 Occlusion and stenosis of left vertebral artery: Secondary | ICD-10-CM | POA: Diagnosis not present

## 2020-12-11 DIAGNOSIS — I5042 Chronic combined systolic (congestive) and diastolic (congestive) heart failure: Secondary | ICD-10-CM | POA: Diagnosis not present

## 2020-12-11 DIAGNOSIS — S78112A Complete traumatic amputation at level between left hip and knee, initial encounter: Secondary | ICD-10-CM | POA: Diagnosis not present

## 2020-12-11 HISTORY — DX: Cerebral infarction, unspecified: I63.9

## 2020-12-11 LAB — DIFFERENTIAL
Abs Immature Granulocytes: 0.03 10*3/uL (ref 0.00–0.07)
Basophils Absolute: 0 10*3/uL (ref 0.0–0.1)
Basophils Relative: 0 %
Eosinophils Absolute: 0.6 10*3/uL — ABNORMAL HIGH (ref 0.0–0.5)
Eosinophils Relative: 8 %
Immature Granulocytes: 0 %
Lymphocytes Relative: 19 %
Lymphs Abs: 1.4 10*3/uL (ref 0.7–4.0)
Monocytes Absolute: 0.5 10*3/uL (ref 0.1–1.0)
Monocytes Relative: 6 %
Neutro Abs: 5.1 10*3/uL (ref 1.7–7.7)
Neutrophils Relative %: 67 %

## 2020-12-11 LAB — COMPREHENSIVE METABOLIC PANEL
ALT: 10 U/L (ref 0–44)
AST: 17 U/L (ref 15–41)
Albumin: 3 g/dL — ABNORMAL LOW (ref 3.5–5.0)
Alkaline Phosphatase: 76 U/L (ref 38–126)
Anion gap: 9 (ref 5–15)
BUN: 11 mg/dL (ref 8–23)
CO2: 24 mmol/L (ref 22–32)
Calcium: 8.9 mg/dL (ref 8.9–10.3)
Chloride: 110 mmol/L (ref 98–111)
Creatinine, Ser: 1.03 mg/dL (ref 0.61–1.24)
GFR, Estimated: >60 mL/min (ref >60)
Glucose, Bld: 95 mg/dL (ref 70–99)
Potassium: 3.6 mmol/L (ref 3.5–5.1)
Sodium: 143 mmol/L (ref 135–145)
Total Bilirubin: 0.8 mg/dL (ref 0.3–1.2)
Total Protein: 6.5 g/dL (ref 6.5–8.1)

## 2020-12-11 LAB — RESP PANEL BY RT-PCR (FLU A&B, COVID) ARPGX2
Influenza A by PCR: NEGATIVE
Influenza B by PCR: NEGATIVE
SARS Coronavirus 2 by RT PCR: NEGATIVE

## 2020-12-11 LAB — URINALYSIS, ROUTINE W REFLEX MICROSCOPIC
Bilirubin Urine: NEGATIVE
Glucose, UA: NEGATIVE mg/dL
Hgb urine dipstick: NEGATIVE
Ketones, ur: NEGATIVE mg/dL
Leukocytes,Ua: NEGATIVE
Nitrite: NEGATIVE
Protein, ur: NEGATIVE mg/dL
Specific Gravity, Urine: 1.016 (ref 1.005–1.030)
pH: 7 (ref 5.0–8.0)

## 2020-12-11 LAB — I-STAT CHEM 8, ED
BUN: 11 mg/dL (ref 8–23)
Calcium, Ion: 1.15 mmol/L (ref 1.15–1.40)
Chloride: 109 mmol/L (ref 98–111)
Creatinine, Ser: 0.9 mg/dL (ref 0.61–1.24)
Glucose, Bld: 101 mg/dL — ABNORMAL HIGH (ref 70–99)
HCT: 37 % — ABNORMAL LOW (ref 39.0–52.0)
Hemoglobin: 12.6 g/dL — ABNORMAL LOW (ref 13.0–17.0)
Potassium: 3.6 mmol/L (ref 3.5–5.1)
Sodium: 144 mmol/L (ref 135–145)
TCO2: 23 mmol/L (ref 22–32)

## 2020-12-11 LAB — PROTIME-INR
INR: 1 (ref 0.8–1.2)
Prothrombin Time: 12.9 seconds (ref 11.4–15.2)

## 2020-12-11 LAB — RAPID URINE DRUG SCREEN, HOSP PERFORMED
Amphetamines: NOT DETECTED
Barbiturates: NOT DETECTED
Benzodiazepines: NOT DETECTED
Cocaine: NOT DETECTED
Opiates: NOT DETECTED
Tetrahydrocannabinol: NOT DETECTED

## 2020-12-11 LAB — CBC
HCT: 40.3 % (ref 39.0–52.0)
Hemoglobin: 12 g/dL — ABNORMAL LOW (ref 13.0–17.0)
MCH: 28.2 pg (ref 26.0–34.0)
MCHC: 29.8 g/dL — ABNORMAL LOW (ref 30.0–36.0)
MCV: 94.6 fL (ref 80.0–100.0)
Platelets: 221 10*3/uL (ref 150–400)
RBC: 4.26 MIL/uL (ref 4.22–5.81)
RDW: 17 % — ABNORMAL HIGH (ref 11.5–15.5)
WBC: 7.7 10*3/uL (ref 4.0–10.5)
nRBC: 0 % (ref 0.0–0.2)

## 2020-12-11 LAB — CBG MONITORING, ED: Glucose-Capillary: 92 mg/dL (ref 70–99)

## 2020-12-11 LAB — MRSA PCR SCREENING: MRSA by PCR: NEGATIVE

## 2020-12-11 LAB — ECHOCARDIOGRAM COMPLETE
Height: 72 in
Weight: 2246.93 oz

## 2020-12-11 LAB — APTT: aPTT: 29 seconds (ref 24–36)

## 2020-12-11 LAB — ETHANOL: Alcohol, Ethyl (B): <10 mg/dL (ref <10)

## 2020-12-11 MED ORDER — INFLUENZA VAC SPLIT QUAD 0.5 ML IM SUSY
0.5000 mL | PREFILLED_SYRINGE | INTRAMUSCULAR | Status: AC
  Administered 2020-12-12: 0.5 mL via INTRAMUSCULAR
  Filled 2020-12-11: qty 0.5

## 2020-12-11 MED ORDER — IOHEXOL 350 MG/ML SOLN
75.0000 mL | Freq: Once | INTRAVENOUS | Status: AC | PRN
  Administered 2020-12-11: 75 mL via INTRAVENOUS

## 2020-12-11 MED ORDER — ACETAMINOPHEN 325 MG PO TABS: 650.0000 mg | ORAL_TABLET | ORAL | Status: DC | PRN

## 2020-12-11 MED ORDER — ACETAMINOPHEN 650 MG RE SUPP: 650.0000 mg | RECTAL | Status: DC | PRN

## 2020-12-11 MED ORDER — SENNOSIDES-DOCUSATE SODIUM 8.6-50 MG PO TABS: 1.0000 | ORAL_TABLET | Freq: Every evening | ORAL | Status: DC | PRN

## 2020-12-11 MED ORDER — STROKE: EARLY STAGES OF RECOVERY BOOK
Freq: Once | Status: AC
  Administered 2020-12-11: 1
  Filled 2020-12-11: qty 1

## 2020-12-11 MED ORDER — SODIUM CHLORIDE 0.9 % IV SOLN
50.0000 mL | Freq: Once | INTRAVENOUS | Status: AC
  Administered 2020-12-11: 50 mL via INTRAVENOUS

## 2020-12-11 MED ORDER — PANTOPRAZOLE SODIUM 40 MG IV SOLR
40.0000 mg | Freq: Every day | INTRAVENOUS | Status: DC
  Administered 2020-12-11: 40 mg via INTRAVENOUS
  Filled 2020-12-11: qty 40

## 2020-12-11 MED ORDER — ACETAMINOPHEN 160 MG/5ML PO SOLN: 650.0000 mg | ORAL | Status: DC | PRN

## 2020-12-11 MED ORDER — SODIUM CHLORIDE 0.9 % IV SOLN: INTRAVENOUS | Status: DC

## 2020-12-11 MED ORDER — ALTEPLASE (STROKE) FULL DOSE INFUSION
0.9000 mg/kg | Freq: Once | INTRAVENOUS | Status: AC
  Administered 2020-12-11: 57.3 mg via INTRAVENOUS
  Filled 2020-12-11: qty 100

## 2020-12-11 NOTE — Progress Notes (Addendum)
CSW spoke with Claiborne Billings at [facility] regarding this patient - Claiborne Billings reports the patient was scheduled for discharge from the facility for this Friday, 12/14/20 and that his family desires to take him home. Claiborne Billings reports the patient will likely need [facility] services and DME prior to discharge.  Madilyn Fireman, MSW, LCSW-A Transitions of Care  Clinical Social Worker I [facility] Emergency Departments  Medical ICU [phone]

## 2020-12-11 NOTE — ED Notes (Signed)
Pt transferred to ECHO.

## 2020-12-11 NOTE — Progress Notes (Signed)
  Echocardiogram 2D Echocardiogram has been performed.  Mike [name] 12/11/2020, 3:59 PM

## 2020-12-11 NOTE — ED Provider Notes (Signed)
[facility] EMERGENCY DEPARTMENT Provider Note   CSN: [id number] Arrival date & time: 12/11/20  1019     History Chief Complaint  Patient presents with  . Code Stroke    Mike [name] is a 59 y.o. male PMHx with MI, HTN, PAD, HFrEF, and s/p Left AKA 11/06/20 presents with stroke like symptoms.   Last known normal was just prior to 930-945am. Patient was in rehab inpatient facility due to recovering from knee surgery when therapist notice left sided facial droop, slurred and confused speech. EMS was called and patient presented to ED for code stroke.   Initial additional symptoms per stroke team were left sided pronator drift, left inferior quandrantopia, and left sensory deficit.   Patient endorses compliance with DAPT and antihypertensive medication.      Past Medical History:  Diagnosis Date  . CAD (coronary artery disease)   . Heart attack ([place])   . Hyperlipidemia   . Hypertension   . PAD (peripheral artery disease) [facility])    Patient Active Problem List   Diagnosis Date Noted  . Pressure injury of skin 11/12/2020  . Acute urinary retention 11/09/2020  . Normal anion gap metabolic acidosis 95/62/1308  . Hypotension 11/07/2020  . Chest pain 10/31/2020  . Acute on chronic HFrEF (heart failure with reduced ejection fraction) ([place])   . Goals of care, counseling/discussion   . Palliative care by specialist   . DNR (do not resuscitate)   . Critical limb ischemia with history of revascularization of same extremity ([place]) 10/30/2020  . Hyperglycemia 10/30/2020  . Tobacco abuse 10/30/2020  . Leukocytosis 10/30/2020  . Chronic combined systolic and diastolic CHF (congestive heart failure) ([place]) 10/30/2020  . Ischemic cardiomyopathy 10/24/2020  . AKI (acute kidney injury) ([place]) 10/17/2020  . PAD (peripheral artery disease) ([place]) 10/16/2020  . Critical lower limb ischemia ([place]) 10/05/2020  . Rest pain of left upper extremity due to atherosclerosis ([place])  10/05/2020  . Severe claudication ([place]) 09/20/2020  . Paresthesia of both lower extremities 09/20/2020  . H/O cardiac arrest 09/20/2020  . H/O acute myocardial infarction 08/14/2020  . Snoring 08/14/2020  . Sinus pause 08/14/2020  . Mixed hyperlipidemia 08/14/2020  . Tobacco dependence 08/14/2020  . Coronary artery disease involving native coronary artery of native heart without angina pectoris 08/10/2020  . HFrEF (heart failure with reduced ejection fraction) ([place]) 08/10/2020  . Moderate mitral regurgitation 08/10/2020  . STEMI (ST elevation myocardial infarction) ([place]) 08/02/2020  . Accelerated hypertension 08/01/2020    Past Surgical History:  Procedure Laterality Date  . ABDOMINAL AORTOGRAM W/LOWER EXTREMITY N/A 10/16/2020   Procedure: ABDOMINAL AORTOGRAM W/LOWER EXTREMITY;  Surgeon: Nigel Mormon, MD;  Location: [facility];  Service: Cardiovascular;  Laterality: N/A;  . AMPUTATION Left 11/06/2020   Procedure: AMPUTATION ABOVE KNEE;  Surgeon: Rosetta Posner, MD;  Location: [facility];  Service: Vascular;  Laterality: Left;  . CORONARY STENT INTERVENTION N/A 08/03/2020   Procedure: CORONARY STENT INTERVENTION;  Surgeon: Nigel Mormon, MD;  Location: [facility];  Service: Cardiovascular;  Laterality: N/A;  . CORONARY/GRAFT ACUTE MI REVASCULARIZATION N/A 08/01/2020   Procedure: Coronary/Graft Acute MI Revascularization;  Surgeon: Nigel Mormon, MD;  Location: [facility];  Service: Cardiovascular;  Laterality: N/A;  . INTRAVASCULAR LITHOTRIPSY Right 10/16/2020   Procedure: INTRAVASCULAR LITHOTRIPSY;  Surgeon: Nigel Mormon, MD;  Location: [facility];  Service: Cardiovascular;  Laterality: Right;  Common and External Iliac  . LEFT HEART CATH  AND CORONARY ANGIOGRAPHY N/A 08/01/2020   Procedure: LEFT HEART CATH AND CORONARY ANGIOGRAPHY;  Surgeon: Nigel Mormon, MD;  Location: [facility];  Service: Cardiovascular;  Laterality:  N/A;  . LEFT HEART CATH AND CORONARY ANGIOGRAPHY N/A 08/03/2020   Procedure: LEFT HEART CATH AND CORONARY ANGIOGRAPHY;  Surgeon: Nigel Mormon, MD;  Location: [facility];  Service: Cardiovascular;  Laterality: N/A;  . PERIPHERAL VASCULAR INTERVENTION Right 10/16/2020   Procedure: PERIPHERAL VASCULAR INTERVENTION;  Surgeon: Nigel Mormon, MD;  Location: [facility];  Service: Cardiovascular;  Laterality: Right;  Common and Iliac Stent      Family History  Problem Relation Age of Onset  . Heart disease Mother   . Heart disease Father   . Cancer Brother     Social History   Tobacco Use  . Smoking status: Current Some Day Smoker    Packs/day: 0.50    Years: 40.00    Pack years: 20.00    Types: Cigarettes  . Smokeless tobacco: Never Used  Vaping Use  . Vaping Use: Never used  Substance Use Topics  . Alcohol use: Yes    Alcohol/week: 10.0 standard drinks    Types: 10 Cans of beer per week    Comment: occ  . Drug use: Never    Home Medications Prior to Admission medications   Medication Sig Start Date End Date Taking? Authorizing Provider  aspirin 81 MG EC tablet Take 1 tablet (81 mg total) by mouth daily. Swallow whole. 08/14/20   Patwardhan, Reynold Bowen, MD  clopidogrel (PLAVIX) 75 MG tablet Take 1 tablet (75 mg total) by mouth daily. 11/15/20   Little Ishikawa, MD  collagenase (SANTYL) ointment Apply topically daily. 11/15/20   Little Ishikawa, MD  ENTRESTO 49-51 MG Take 1 tablet by mouth 2 (two) times daily. 11/04/20   [provider]  gabapentin (NEURONTIN) 300 MG capsule Take 1 capsule (300 mg total) by mouth 2 (two) times daily. 11/15/20   Little Ishikawa, MD  liver oil-zinc oxide (DESITIN) 40 % ointment Apply 1 application topically as needed for irritation.    [provider]  metroNIDAZOLE (FLAGYL) 500 MG tablet Take 1 tablet (500 mg total) by mouth every 8 (eight) hours. 11/15/20   Little Ishikawa, MD   nicotine (NICODERM CQ - DOSED IN MG/24 HR) 7 mg/24hr patch Place 1 patch (7 mg total) onto the skin daily. 08/04/20 08/04/21  Patwardhan, Reynold Bowen, MD  nitroGLYCERIN (NITROSTAT) 0.4 MG SL tablet Place 1 tablet (0.4 mg total) under the tongue every 5 (five) minutes as needed for chest pain. 08/14/20 11/12/20  Patwardhan, Reynold Bowen, MD  Nystatin (GERHARDT'S BUTT CREAM) CREA Apply 1 application topically 2 (two) times daily. 11/15/20 12/15/20  Little Ishikawa, MD  rosuvastatin (CRESTOR) 10 MG tablet Take 1 tablet (10 mg total) by mouth daily. 11/15/20   Little Ishikawa, MD  sulfamethoxazole-trimethoprim (BACTRIM DS) 800-160 MG tablet Take 1 tablet by mouth every 12 (twelve) hours. 11/15/20   Little Ishikawa, MD    Allergies    Chlorhexidine  Review of Systems   Review of Systems  Constitutional: Positive for activity change. Negative for fever.  HENT: Negative for drooling and tinnitus.   Respiratory: Negative for shortness of breath.   Cardiovascular: Negative for chest pain.  Gastrointestinal: Negative for abdominal pain.  Neurological: Positive for facial asymmetry, speech difficulty and numbness. Negative for weakness.    Physical Exam Updated Vital Signs Ht  6' (1.829 m)   Wt 63.7 kg   BMI 19.05 kg/m   Physical Exam Constitutional:      General: He is not in acute distress.    Appearance: Normal appearance. He is not ill-appearing or diaphoretic.  HENT:     Head: Normocephalic.     Nose: Nose normal.  Eyes:     General: No visual field deficit.    Extraocular Movements: Extraocular movements intact.     Conjunctiva/sclera: Conjunctivae normal.  Cardiovascular:     Rate and Rhythm: Normal rate and regular rhythm.     Pulses: Normal pulses.     Heart sounds: Normal heart sounds.  Pulmonary:     Effort: Pulmonary effort is normal.     Breath sounds: Normal breath sounds.  Abdominal:     General: Abdomen is flat. Bowel sounds are normal.     Palpations: Abdomen  is soft.     Tenderness: There is no abdominal tenderness. There is no guarding.  Neurological:     Mental Status: He is alert and oriented to person, place, and time.     Cranial Nerves: No dysarthria or facial asymmetry.     Sensory: Sensory deficit present.     Motor: Motor function is intact. No weakness or pronator drift.     Coordination: Finger-Nose-Finger Test abnormal.     Comments: Mild left U+L ext sensory deficit. Minor dysmetria with finger to nose test.     ED Results / Procedures / Treatments   Labs (all labs ordered are listed, but only abnormal results are displayed) Labs Reviewed  I-STAT CHEM 8, ED - Abnormal; Notable for the following components:      Result Value   Glucose, Bld 101 (*)    Hemoglobin 12.6 (*)    HCT 37.0 (*)    All other components within normal limits  ETHANOL  PROTIME-INR  APTT  CBC  DIFFERENTIAL  COMPREHENSIVE METABOLIC PANEL  RAPID URINE DRUG SCREEN, HOSP PERFORMED  URINALYSIS, ROUTINE W REFLEX MICROSCOPIC  CBG MONITORING, ED    EKG None  Radiology CT Code Stroke CTA Head W/WO contrast  Result Date: 12/11/2020 CLINICAL DATA:  Left facial droop, weakness EXAM: CT ANGIOGRAPHY HEAD AND NECK TECHNIQUE: Multidetector CT imaging of the head and neck was performed using the standard protocol during bolus administration of intravenous contrast. Multiplanar CT image reconstructions and MIPs were obtained to evaluate the vascular anatomy. Carotid stenosis measurements (when applicable) are obtained utilizing NASCET criteria, using the distal internal carotid diameter as the denominator. CONTRAST:  15mL OMNIPAQUE IOHEXOL 350 MG/ML SOLN COMPARISON:  None. FINDINGS: CTA NECK Aortic arch: Mixed plaque along the arch and great vessel origins. Right carotid system: Patent. Atherosclerotic wall thickening and irregularity along the common carotid. Mild calcified plaque along the proximal internal carotid with less than 50% stenosis. Left carotid system:  Patent. Noncalcified plaque at the common carotid origin causes approximately 50% stenosis. Atherosclerotic wall thickening and irregularity along the common carotid. Mild mixed plaque at the ICA origin without measurable stenosis. Vertebral arteries: Extracranial right vertebral artery is patent. There is small caliber of the extracranial left vertebral artery with diminished opacification. Calcified plaque along V1 segment. Skeleton: Degenerative changes of the included spine. Other neck: No mass or adenopathy. Upper chest: Emphysema. Review of the MIP images confirms the above findings CTA HEAD Anterior circulation: Intracranial internal carotid arteries are patent with mixed plaque causing up to moderate stenosis on the right mild stenosis on left. Anterior cerebral arteries are  patent. 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 Anterior communicating artery is present. Middle cerebral arteries are patent. Atherosclerotic irregularity of distal branches bilaterally including suspected high-grade stenosis of a distal right M2 MCA branch. Posterior circulation: Intracranial right vertebral artery is patent. Intracranial left vertebral artery is patent with atherosclerotic irregularity and focal calcified plaque proximally causing marked stenosis. Basilar artery is patent. Posterior cerebral arteries are patent. Moderate stenosis of the distal left P2 PCA. Venous sinuses: Patent as allowed by contrast bolus timing. Review of the MIP images confirms the above findings IMPRESSION: Atherosclerosis without large vessel occlusion. No hemodynamically significant stenosis at the ICA origins. Likely chronically small caliber extracranial left vertebral artery with diminished opacification. Atherosclerotic irregularity of distal ACA and MCA branches including suspected high-grade stenosis of distal right M2 MCA branch. Intracranial left vertebral artery atherosclerosis with focal marked stenosis proximally. Moderate stenosis of  distal left P2 PCA. Electronically Signed   By: Macy Mis M.D.   On: 12/11/2020 11:22   CT Code Stroke CTA Neck W/WO contrast  Result Date: 12/11/2020 CLINICAL DATA:  Left facial droop, weakness EXAM: CT ANGIOGRAPHY HEAD AND NECK TECHNIQUE: Multidetector CT imaging of the head and neck was performed using the standard protocol during bolus administration of intravenous contrast. Multiplanar CT image reconstructions and MIPs were obtained to evaluate the vascular anatomy. Carotid stenosis measurements (when applicable) are obtained utilizing NASCET criteria, using the distal internal carotid diameter as the denominator. CONTRAST:  68mL OMNIPAQUE IOHEXOL 350 MG/ML SOLN COMPARISON:  None. FINDINGS: CTA NECK Aortic arch: Mixed plaque along the arch and great vessel origins. Right carotid system: Patent. Atherosclerotic wall thickening and irregularity along the common carotid. Mild calcified plaque along the proximal internal carotid with less than 50% stenosis. Left carotid system: Patent. Noncalcified plaque at the common carotid origin causes approximately 50% stenosis. Atherosclerotic wall thickening and irregularity along the common carotid. Mild mixed plaque at the ICA origin without measurable stenosis. Vertebral arteries: Extracranial right vertebral artery is patent. There is small caliber of the extracranial left vertebral artery with diminished opacification. Calcified plaque along V1 segment. Skeleton: Degenerative changes of the included spine. Other neck: No mass or adenopathy. Upper chest: Emphysema. Review of the MIP images confirms the above findings CTA HEAD Anterior circulation: Intracranial internal carotid arteries are patent with mixed plaque causing up to moderate stenosis on the right mild stenosis on left. Anterior cerebral arteries are patent. 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 Anterior communicating artery is present. Middle cerebral arteries are patent. Atherosclerotic irregularity  of distal branches bilaterally including suspected high-grade stenosis of a distal right M2 MCA branch. Posterior circulation: Intracranial right vertebral artery is patent. Intracranial left vertebral artery is patent with atherosclerotic irregularity and focal calcified plaque proximally causing marked stenosis. Basilar artery is patent. Posterior cerebral arteries are patent. Moderate stenosis of the distal left P2 PCA. Venous sinuses: Patent as allowed by contrast bolus timing. Review of the MIP images confirms the above findings IMPRESSION: Atherosclerosis without large vessel occlusion. No hemodynamically significant stenosis at the ICA origins. Likely chronically small caliber extracranial left vertebral artery with diminished opacification. Atherosclerotic irregularity of distal ACA and MCA branches including suspected high-grade stenosis of distal right M2 MCA branch. Intracranial left vertebral artery atherosclerosis with focal marked stenosis proximally. Moderate stenosis of distal left P2 PCA. Electronically Signed   By: Macy Mis M.D.   On: 12/11/2020 11:22   CT HEAD CODE STROKE WO CONTRAST  Result Date: 12/11/2020 CLINICAL DATA:  Code stroke. Neuro  deficit. Left facial weakness slurred speech EXAM: CT HEAD WITHOUT CONTRAST TECHNIQUE: Contiguous axial images were obtained from the base of the skull through the vertex without intravenous contrast. COMPARISON:  None. FINDINGS: Brain: Hypodensity in the left anterior frontal lobe compatible with infarction of indeterminate age. Scattered small white matter hypodensities bilaterally, mild in degree. Ventricle size normal. No acute hemorrhage or mass lesion. Vascular: Negative for hyperdense vessel Skull: Negative Sinuses/Orbits: Paranasal sinuses clear.  Negative orbit. Other: None ASPECTS ([place] Stroke Program Early CT Score) - Ganglionic level infarction (caudate, lentiform nuclei, internal capsule, insula, M1-M3 cortex): 7 - Supraganglionic  infarction (M4-M6 cortex): 3 Total score (0-10 with 10 being normal): 10 IMPRESSION: 1. Hypodensity left anterior frontal lobe compatible with infarct in the ACA territory. This is likely chronic but could be subacute. No acute hemorrhage. 2. ASPECTS is 10 3. These results were called by telephone at the time of interpretation on 12/11/2020 at 10:33 am to provider Bhagat, who verbally acknowledged these results. Electronically Signed   By: Franchot Gallo M.D.   On: 12/11/2020 10:35    Medications Ordered in ED Medications  alteplase (ACTIVASE) 1 mg/mL infusion 57.3 mg (has no administration in time range)    Followed by  0.9 %  sodium chloride infusion (has no administration in time range)  iohexol (OMNIPAQUE) 350 MG/ML injection 75 mL (75 mLs Intravenous Contrast Given 12/11/20 1043)   ED Course  I have reviewed the triage vital signs and the nursing notes.  Pertinent labs & imaging results that were available during my care of the patient were reviewed by me and considered in my medical decision making (see chart for details).  Review CT imaging above which showed likely chronic infarct in ACA territory; in addition multiple smaller territories of stenosis w/o large vessel occlusion    MDM Rules/Calculators/A&P                          JAQUE DACY is a 59 y.o. male PMHx with MI, HTN, PAD, HFrEF, and s/p Left AKA 11/06/20 presents as a code stroke.   Patient initially hypertensive but otherwise stable and taken to CT. CT consistent with likely non-acute infarct. Stroke team work up was initiated and patient given tPA for ongoing sensory and vision deficits.   Upon my examination vision deficit resolved but continued to have lingering left side sensory deficits. Alert and oriented x4 and able to follow all commands.   Spoke with Neuro team and they will admit for ongoing work up. Due to resolution of symptom they did not think ICU was appropriate for patient's status at this time.     Final Clinical Impression(s) / ED Diagnoses Final diagnoses:  Stroke-like symptoms    Rx / DC Orders ED Discharge Orders    None       Gerlene Fee, DO 12/11/20 1341    Elnora Morrison, MD 12/12/20 1600

## 2020-12-11 NOTE — Progress Notes (Signed)
PHARMACIST CODE STROKE RESPONSE  Notified to mix tPA at 1047 by Dr. Erlinda Hong Delivered tPA to RN at 1051  tPA dose = 5.7mg  bolus over 1 minute followed by 51.6mg  for a total dose of 57.3mg  over 1 hour  Issues/delays encountered (if applicable): n/a  Cristela Felt, PharmD Clinical Pharmacist  12/11/20 10:53 AM

## 2020-12-11 NOTE — H&P (Addendum)
Neurology H&P  CC: CODE STROKE  History is obtained from: Patient, EMS  HPI: Mike [name] is a 59 y.o. 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 Donnetta Hutching on ASA and Plavix, HTN, HLD, CAD s/p STEMI with stenting 08/18/19, CHF, and tobacco abuse.  Mike [name] presents as a CODE STROKE today by EMS from inpatient rehab at [facility]. He was in PT this am and the therapist noticed a left facial droop and slurred speech.   Pt is awake, alert and oriented on arrival. EMS states he had above sx plus left arm weakness and left arm sensory loss with left arm drift. In the ED, pt has no sensation in his LUE, but normal elsewhere. No aphasia or dysarthria noted. + slight left facial droop. Strength was normal on arrival. EMS stated this was already an improvement as to what they saw on transport. NIHSS on arrival was 5.   Pt was taken immediately to CT scanner. No acute CVA noted on CT. Pt was noted after futher exam to have left downward hemianopia and left sided neglect. CTA head and neck showed no LVO. TPA was given at 1051.   By the end of procedure, pt was already improving. With tPA infusing, his NIHSS was down to 2. His vision had corrected and sensation was back intact although slightly decreased to light touch LUE.   Dr. Erlinda [name] present for event.    LKW: 0930 tpa given?: Yes 3532 IR Thrombectomy? No, no LVO Modified Rankin: 2 NIHSS: 5 down to 2.   ROS: A complete ROS was performed and is negative except as noted in the HPI.   Past Medical History:  Diagnosis Date  . CAD (coronary artery disease)   . Heart attack ([place])   . Hyperlipidemia   . Hypertension   . PAD (peripheral artery disease) ([facility])   CHF   Family History  Problem Relation Age of Onset  . Heart disease Mother   . Heart disease Father   . Cancer Brother     Social History:  reports that he has been smoking cigarettes. He has a 20.00 pack-year smoking history. He has never used smokeless tobacco. He  reports current alcohol use of about 10.0 standard drinks of alcohol per week. He reports that he does not use drugs.   Prior to Admission medications   Medication Sig Start Date End Date Taking? Authorizing Provider  aspirin 81 MG EC tablet Take 1 tablet (81 mg total) by mouth daily. Swallow whole. 08/14/20   Patwardhan, Reynold Bowen, MD  clopidogrel (PLAVIX) 75 MG tablet Take 1 tablet (75 mg total) by mouth daily. 11/15/20   Little Ishikawa, MD  collagenase (SANTYL) ointment Apply topically daily. 11/15/20   Little Ishikawa, MD  ENTRESTO 49-51 MG Take 1 tablet by mouth 2 (two) times daily. 11/04/20   [provider]  gabapentin (NEURONTIN) 300 MG capsule Take 1 capsule (300 mg total) by mouth 2 (two) times daily. 11/15/20   Little Ishikawa, MD  liver oil-zinc oxide (DESITIN) 40 % ointment Apply 1 application topically as needed for irritation.    [provider]  metroNIDAZOLE (FLAGYL) 500 MG tablet Take 1 tablet (500 mg total) by mouth every 8 (eight) hours. 11/15/20   Little Ishikawa, MD  nicotine (NICODERM CQ - DOSED IN MG/24 HR) 7 mg/24hr patch Place 1 patch (7 mg total) onto the skin daily. 08/04/20 08/04/21  Patwardhan, Reynold Bowen, MD  nitroGLYCERIN (NITROSTAT) 0.4  MG SL tablet Place 1 tablet (0.4 mg total) under the tongue every 5 (five) minutes as needed for chest pain. 08/14/20 11/12/20  Patwardhan, Reynold Bowen, MD  Nystatin (GERHARDT'S BUTT CREAM) CREA Apply 1 application topically 2 (two) times daily. 11/15/20 12/15/20  Little Ishikawa, MD  rosuvastatin (CRESTOR) 10 MG tablet Take 1 tablet (10 mg total) by mouth daily. 11/15/20   Little Ishikawa, MD  sulfamethoxazole-trimethoprim (BACTRIM DS) 800-160 MG tablet Take 1 tablet by mouth every 12 (twelve) hours. 11/15/20   Little Ishikawa, MD    Exam: Current vital signs: BP 127/76   Pulse 81   Temp 98.2 F (36.8 C) (Oral)   Resp (!) 21   Ht 6' (1.829 m)   Wt 63.7 kg   SpO2 99%   BMI 19.05  kg/m   Physical Exam  Constitutional: Appears well-developed and well-nourished.  Psych: Affect appropriate to situation Eyes: No scleral injection HENT: No OP obstrucion Head: Normocephalic.  Cardiovascular: Normal rate and regular rhythm.  Respiratory: Effort normal GI: Soft.   Skin: WDI Extremities: Left AKA  Neuro: Mental Status: Patient is awake, alert, oriented to person, place, month, year, and situation. Patient is able to give a clear and coherent history. No signs of aphasia. Left sided neglect Speech/Language: Cranial Nerves: II: Visual Fields with cut to left eye. Left downward gaze hemianopia, later cleared. Visual acuity intact with reading. Pupils are equal, round, and reactive to light.  III,IV, VI: EOMI without ptosis or diploplia.  V: Facial sensation decreased slightly to left side to light touch, later resolved.  VII: Facial movement with slight left facial droop.  VIII: hearing is intact to voice X: Uvula elevates symmetrically XI: Shoulder shrug is symmetric. XII: tongue is slightly to the left. No atrophy or fasciculations.  Motor: Tone is normal. Bulk is normal. 5/5 strength was present in all four extremities.  Sensory: Sensation is absent to LUE on arrival, later just decreased slightly.  Plantars: Toes are downgoing right. Left AKA.  Cerebellar: FNF slightly ataxic on left, later improved. Can not do HKS but to left AKA.    I have reviewed labs in epic and the pertinent results are: Creat .90. Hgb 12.6, CBG 92.   I have reviewed the images obtained: NCT head showed 1. Hypodensity left anterior frontal lobe compatible with infarct in the ACA territory. This is likely chronic but could be subacute. No acute hemorrhage. 2. ASPECTS is 10 3. These results were called by telephone at the time of interpretation on 12/11/2020 at 10:33 am to provider Bhagat, who verbally acknowledged these results.  CTA head and neck showed Atherosclerosis without  large vessel occlusion.  No hemodynamically significant stenosis at the ICA origins. Likely chronically small caliber extracranial left vertebral artery with diminished opacification.  Atherosclerotic irregularity of distal ACA and MCA branches including suspected high-grade stenosis of distal right M2 MCA branch.  Intracranial left vertebral artery atherosclerosis with focal marked stenosis proximally. Moderate stenosis of distal left P2 PCA.   Primary Diagnosis:  CVA  Secondary Diagnosis: 1. HTN 2. HLD 3. Tobacco abuse 4. PAD 5. CAD s/p STEMI with stenting.  Assessment: Mike [name] is a 59 y.o. male PMHx significant for PAD, STEMI, CAD, tobacco abuse and HLD. Mike [name] presented as a code stroke. CT showed subacute to chronic left frontal ACA territory infarct, but no acute infarct. CTA with high grade stenosis of distal right M2 MCA and LVA atherosclerosis with stenosis proximally. Stenosis of distal left P2  PCA. Pt treated with tPA and had improvement in his sx.    Already on Plavix/ASA at home.   Impression: CVA with multiple vascular risk factors s/p tPA.   Plan: - MRI brain without contrast. -Optimist trial.  -post tPA protocol.  - Recommend TTE. - Recommend labs: HbA1c, lipid panel, TSH. - Recommend Statin if LDL > 70 - Aspirin 81mg  daily. - Clopidogrel 75mg  daily per cardiology recommendations after STEMI in August.  - SBP goal - Permissive hypertension first 24 h < 180/105 after tPA - Telemetry monitoring for arrhythmia. - Recommend bedside Swallow screen. - Recommend Stroke education. - Recommend PT/OT/SLP consult. - Recommend metabolic/infectious workup with CBC, CMP, UA with UCx, CXR, CK, serum lactate. -Discussed with Dr. Erlinda [name] and MD in ED that if pt remains stable he can go to 3W instead of ICU.    Electronically signed by: Clance Boll, MSN, APN-BC, nurse practitioner Pt seen with Dr. Erlinda [name] who will edit note/plan as needed.  12/11/2020, 11:40 AM    ATTENDING NOTE: I reviewed above note and agree with the assessment and plan. Pt was seen and examined.   59 yo M with PMH of PAD with left AKA on 11/06/2020, CHF, CAD/MI s/p stenting 07/2020, HTN, HLD, smoker, currently at rehab post [facility] presented to ED for left facial droop, left arm numbness, left ataxia. Time onset 9:35am while working with PT/OT. Initial NIHSS = 6 with left facial droop, left lower quadrantanopia, left arm loss sensation, left FTN ataxia with left sensory neglect. CT no acute finding but chronic left ACA infarct. CTA head and neck no LVO. After CT, pt NIHSS=3 with only left lower quadrantanopia and left arm light touch sensation and left arm sensory neglect. Although symptoms improving, but with disabling symptoms, discussed with pt about tPA benefit and risk and pt would like to proceed. BP 170s/90, no contraindication for tPA. tPA was given. Repeat NIHSS post tPA showed slight left arm decreased sensation only. Will admit to floor and will consider to enroll into OPTIMISTmain trial if NIHSS still low at 2hrs. Continue post tPA care and will do MRI at 24h of tPA.  I have explained to the patient the nature of the patient's condition, the use of tPA fibrinolytic agent, and the benefits to be reasonably expected compared with alternative approaches. However, there is a small but real risk of complication with this medication, including (if applicable) but not limited to permanent neurologic injury (paralysis, coma, etc.), bleeding in the brain or other part of the body, need for blood product transfusion, drug reactions, and even loss of life. I have also indicated that with any procedure there is always the possibility of an unexpected complication. All questions were answered prior to initiating therapy and patient express understanding of the treatment plan and consent to the treatment.  - Admit for routine post IV tPA care  - Check blood pressure and NIHSS every 15 min for 2 h,  then every 30 min for 6 h, and finally every hour for 16 h - BP goal < 180/105 - CT or MRI brain 24 hours post tPA - Stat CT head without contrast if acute neuro changes - NPO until swallowing screen performed and passed - No antiplatelet agents or anticoagulants (including heparin for DVT prophylaxis) in first 24 hours - No Foley catheter, nasogastric tube, arterial catheter or central venous catheter for 24 hours, unless absolutely necessary - Telemetry - Euglycemia  - Avoid hyperthermia, PRN acetaminophen - DVT prophylaxis with SCDs  Mike Hawking, MD PhD Stroke Neurology 12/11/2020 3:21 PM  This patient is critically ill and at significant risk of neurological worsening, death and care requires constant monitoring of vital signs, hemodynamics,respiratory and cardiac monitoring, neurological assessment, discussion with family, other specialists and medical decision making of high complexity. I spent 50 minutes of neurocritical care time  in the care of  this patient. This was time spent independent of any time provided by nurse practitioner or PA.

## 2020-12-11 NOTE — ED Notes (Signed)
Pt continue on ECHO.

## 2020-12-11 NOTE — Code Documentation (Signed)
Stroke Response Nurse Documentation Code Documentation  Mike [name] is a 59 y.o. male arriving to [place] ED via [facility] on 12/14 with past medical hx of left leg amputation, PAD, STEMI, CAD, tobacco abuse and HLD. Code stroke was activated by EMS. Patient from [place] where he was LKW at [phone] and now complaining of left facial droop, left ataxia, and left sensory decreased.  Stroke team at the bedside on patient arrival. Patient to CT with team. NIHSS 6 , see documentation for details and code stroke times. Patient with left partial hemianopnia left facial droop, left limb ataxia, left decreased sensation and Visual  neglect on exam. The following imaging was completed:  CT, CTA head and neck. Patient is a candidate for tPA. Care/Plan - Post-tPA monitoring low intensity monitoring ordered by provider: Q15 mNIHSS/VS x 2 hours, Q2 mNIHSS/VS x 8 hours, Q4 mNIHSS/VS x 16 hours. Bedside handoff with ED RN Garlon Hatchet.     Kathrin Greathouse  Stroke Response RN

## 2020-12-11 NOTE — ED Triage Notes (Signed)
BIB EMS for sudden left side facial droop, dysarthria, left side numbness, left side weakness. Pt was working with Physical therapist when this happened. Per EMS LKW was 0935 this morning. Pt at rehabilitation s/p left leg AKA.

## 2020-12-12 ENCOUNTER — Encounter (HOSPITAL_BASED_OUTPATIENT_CLINIC_OR_DEPARTMENT_OTHER): Payer: BC Managed Care – PPO | Admitting: Physician Assistant

## 2020-12-12 ENCOUNTER — Encounter (HOSPITAL_COMMUNITY): Payer: Self-pay | Admitting: *Deleted

## 2020-12-12 ENCOUNTER — Inpatient Hospital Stay (HOSPITAL_COMMUNITY): Payer: BC Managed Care – PPO

## 2020-12-12 DIAGNOSIS — I6340 Cerebral infarction due to embolism of unspecified cerebral artery: Secondary | ICD-10-CM

## 2020-12-12 DIAGNOSIS — E785 Hyperlipidemia, unspecified: Secondary | ICD-10-CM

## 2020-12-12 LAB — CBC
HCT: 36.3 % — ABNORMAL LOW (ref 39.0–52.0)
Hemoglobin: 11 g/dL — ABNORMAL LOW (ref 13.0–17.0)
MCH: 28.3 pg (ref 26.0–34.0)
MCHC: 30.3 g/dL (ref 30.0–36.0)
MCV: 93.3 fL (ref 80.0–100.0)
Platelets: 214 10*3/uL (ref 150–400)
RBC: 3.89 MIL/uL — ABNORMAL LOW (ref 4.22–5.81)
RDW: 17 % — ABNORMAL HIGH (ref 11.5–15.5)
WBC: 8.2 10*3/uL (ref 4.0–10.5)
nRBC: 0 % (ref 0.0–0.2)

## 2020-12-12 LAB — BASIC METABOLIC PANEL
Anion gap: 9 (ref 5–15)
BUN: 13 mg/dL (ref 8–23)
CO2: 24 mmol/L (ref 22–32)
Calcium: 8.6 mg/dL — ABNORMAL LOW (ref 8.9–10.3)
Chloride: 110 mmol/L (ref 98–111)
Creatinine, Ser: 0.98 mg/dL (ref 0.61–1.24)
GFR, Estimated: >60 mL/min (ref >60)
Glucose, Bld: 105 mg/dL — ABNORMAL HIGH (ref 70–99)
Potassium: 3.7 mmol/L (ref 3.5–5.1)
Sodium: 143 mmol/L (ref 135–145)

## 2020-12-12 LAB — LIPID PANEL
Cholesterol: 130 mg/dL (ref 0–200)
HDL: 42 mg/dL (ref >40)
LDL Cholesterol: 74 mg/dL (ref 0–99)
Total CHOL/HDL Ratio: 3.1 RATIO
Triglycerides: 71 mg/dL (ref <150)
VLDL: 14 mg/dL (ref 0–40)

## 2020-12-12 LAB — HEMOGLOBIN A1C
Hgb A1c MFr Bld: 4.7 % — ABNORMAL LOW (ref 4.8–5.6)
Mean Plasma Glucose: 88.19 mg/dL

## 2020-12-12 MED ORDER — RIVAROXABAN 2.5 MG PO TABS
2.5000 mg | ORAL_TABLET | Freq: Two times a day (BID) | ORAL | Status: DC
  Administered 2020-12-12 – 2020-12-13 (×4): 2.5 mg via ORAL
  Filled 2020-12-12 (×5): qty 1

## 2020-12-12 MED ORDER — SACUBITRIL-VALSARTAN 24-26 MG PO TABS
1.0000 | ORAL_TABLET | Freq: Two times a day (BID) | ORAL | Status: DC
  Administered 2020-12-12 – 2020-12-13 (×3): 1 via ORAL
  Filled 2020-12-12 (×4): qty 1

## 2020-12-12 MED ORDER — GABAPENTIN 300 MG PO CAPS
300.0000 mg | ORAL_CAPSULE | Freq: Two times a day (BID) | ORAL | Status: DC
  Administered 2020-12-12 – 2020-12-14 (×6): 300 mg via ORAL
  Filled 2020-12-12 (×6): qty 1

## 2020-12-12 MED ORDER — ASPIRIN EC 81 MG PO TBEC
81.0000 mg | DELAYED_RELEASE_TABLET | Freq: Every day | ORAL | Status: DC
  Administered 2020-12-12 – 2020-12-14 (×3): 81 mg via ORAL
  Filled 2020-12-12 (×3): qty 1

## 2020-12-12 MED ORDER — PANTOPRAZOLE SODIUM 40 MG PO TBEC
40.0000 mg | DELAYED_RELEASE_TABLET | Freq: Every day | ORAL | Status: DC
  Administered 2020-12-12 – 2020-12-13 (×2): 40 mg via ORAL
  Filled 2020-12-12 (×3): qty 1

## 2020-12-12 MED ORDER — ROSUVASTATIN CALCIUM 20 MG PO TABS
20.0000 mg | ORAL_TABLET | Freq: Every day | ORAL | Status: DC
  Administered 2020-12-12 – 2020-12-14 (×3): 20 mg via ORAL
  Filled 2020-12-12 (×3): qty 1

## 2020-12-12 MED ORDER — TAMSULOSIN HCL 0.4 MG PO CAPS
0.4000 mg | ORAL_CAPSULE | Freq: Every day | ORAL | Status: DC
  Administered 2020-12-12 – 2020-12-14 (×3): 0.4 mg via ORAL
  Filled 2020-12-12 (×3): qty 1

## 2020-12-12 MED ORDER — CLOPIDOGREL BISULFATE 75 MG PO TABS
75.0000 mg | ORAL_TABLET | Freq: Every day | ORAL | Status: DC
  Administered 2020-12-12 – 2020-12-14 (×3): 75 mg via ORAL
  Filled 2020-12-12 (×3): qty 1

## 2020-12-12 NOTE — Progress Notes (Signed)
PT Cancellation Note  Patient Details Name: Mike [name] MRN: [id number] DOB: 04-03-1961   Cancelled Treatment:    Reason Eval/Treat Not Completed: Medical issues which prohibited therapy;Patient not medically ready;Active bedrest order Pt received tPA at 10:51on 12/11/20. Hold PT until > 24 hours after tPA administration. Active bedrest orders. Will follow-up as able/appropriate.  Moishe Spice, PT, DPT Acute Rehabilitation Services  Pager: [phone] Office: Agency 12/12/2020, 8:14 AM

## 2020-12-12 NOTE — H&P (View-Only) (Signed)
CARDIOLOGY CONSULT NOTE  Patient ID: CORDEL DREWES MRN: [id number] DOB/AGE: [birth year] 59 y.o.  Admit date: 12/11/2020 Referring Physician: Rosalin Hawking, MD Reason for Consultation:  Stroke  HPI:   59 y.o.Caucasian male with CAD, culprit (RCA) and nonculprit (LCx) PCI after STEMI 07/2020, HFrEF, moderate MR, h/o cardiac arrest 2/2 [place] post PCI during index hospitalization, PAD with critical limb ischemia s/p Rt iliac revascularization, and s/p Lt AKA amputation for critical limb ischemia,  severe bilateral renal artery stenoses w/h/o CIN, now admitted with stroke s/p tPA with resolution of neurodeficit, multifocal stroke on MRI suspicious for cardioembolic source.  Patient is doing well and denies any complaints at this time.He was doing "phenomenal" in rehab, and was about to discharge when he developed acute onset left arm weakness and sensory loss on 12/14. He underwent tPA with resolution of neurodeficit.    Past Medical History:  Diagnosis Date  . CAD (coronary artery disease)   . Heart attack ([place])   . Hyperlipidemia   . Hypertension   . PAD (peripheral artery disease) ([place])      Past Surgical History:  Procedure Laterality Date  . ABDOMINAL AORTOGRAM W/LOWER EXTREMITY N/A 10/16/2020   Procedure: ABDOMINAL AORTOGRAM W/LOWER EXTREMITY;  Surgeon: Nigel Mormon, MD;  Location: [facility];  Service: Cardiovascular;  Laterality: N/A;  . AMPUTATION Left 11/06/2020   Procedure: AMPUTATION ABOVE KNEE;  Surgeon: Rosetta Posner, MD;  Location: [facility];  Service: Vascular;  Laterality: Left;  . CORONARY STENT INTERVENTION N/A 08/03/2020   Procedure: CORONARY STENT INTERVENTION;  Surgeon: Nigel Mormon, MD;  Location: [facility];  Service: Cardiovascular;  Laterality: N/A;  . CORONARY/GRAFT ACUTE MI REVASCULARIZATION N/A 08/01/2020   Procedure: Coronary/Graft Acute MI Revascularization;  Surgeon: Nigel Mormon, MD;  Location: [facility];  Service:  Cardiovascular;  Laterality: N/A;  . INTRAVASCULAR LITHOTRIPSY Right 10/16/2020   Procedure: INTRAVASCULAR LITHOTRIPSY;  Surgeon: Nigel Mormon, MD;  Location: [facility];  Service: Cardiovascular;  Laterality: Right;  Common and External Iliac  . LEFT HEART CATH AND CORONARY ANGIOGRAPHY N/A 08/01/2020   Procedure: LEFT HEART CATH AND CORONARY ANGIOGRAPHY;  Surgeon: Nigel Mormon, MD;  Location: [facility];  Service: Cardiovascular;  Laterality: N/A;  . LEFT HEART CATH AND CORONARY ANGIOGRAPHY N/A 08/03/2020   Procedure: LEFT HEART CATH AND CORONARY ANGIOGRAPHY;  Surgeon: Nigel Mormon, MD;  Location: [facility];  Service: Cardiovascular;  Laterality: N/A;  . PERIPHERAL VASCULAR INTERVENTION Right 10/16/2020   Procedure: PERIPHERAL VASCULAR INTERVENTION;  Surgeon: Nigel Mormon, MD;  Location: [facility];  Service: Cardiovascular;  Laterality: Right;  Common and Iliac Stent      Family History  Problem Relation Age of Onset  . Heart disease Mother   . Heart disease Father   . Cancer Brother      Social History: Social History   Socioeconomic History  . Marital status: Single    Spouse name: Not on file  . Number of children: 1  . Years of education: Not on file  . Highest education level: Not on file  Occupational History  . Not on file  Tobacco Use  . Smoking status: Current Some Day Smoker    Packs/day: 0.50    Years: 40.00    Pack years: 20.00    Types: Cigarettes  . Smokeless tobacco: Never Used  Vaping Use  . Vaping Use: Never used  Substance and Sexual  Activity  . Alcohol use: Yes    Alcohol/week: 10.0 standard drinks    Types: 10 Cans of beer per week    Comment: occ  . Drug use: Never  . Sexual activity: Not on file  Other Topics Concern  . Not on file  Social History Narrative  . Not on file   Social Determinants of Health   Financial Resource Strain: Not on file  Food Insecurity: Not on file   Transportation Needs: Not on file  Physical Activity: Not on file  Stress: Not on file  Social Connections: Not on file  Intimate Partner Violence: Not on file     Medications Prior to Admission  Medication Sig Dispense Refill Last Dose  . aspirin 81 MG EC tablet Take 1 tablet (81 mg total) by mouth daily. Swallow whole. 90 tablet 3 12/11/2020 at 0800  . clopidogrel (PLAVIX) 75 MG tablet Take 1 tablet (75 mg total) by mouth daily. 30 tablet 0 12/11/2020 at 0800  . furosemide (LASIX) 20 MG tablet Take 20 mg by mouth daily.   12/11/2020 at Unknown time  . gabapentin (NEURONTIN) 300 MG capsule Take 1 capsule (300 mg total) by mouth 2 (two) times daily. (Patient taking differently: Take 300-600 mg by mouth See admin instructions. Take 1 capsule (300 mg totally) by mouth in the morning; take 2 capsules (600 mg totally) by mouth at bed time) 60 capsule 0 12/11/2020 at Unknown time  . liver oil-zinc oxide (DESITIN) 40 % ointment Apply 1 application topically daily as needed for irritation.   unk at prn  . nitroGLYCERIN (NITROSTAT) 0.4 MG SL tablet Place 1 tablet (0.4 mg total) under the tongue every 5 (five) minutes as needed for chest pain. (Patient taking differently: Place 0.4 mg under the tongue every 5 (five) minutes as needed for chest pain (max 3 doses).) 30 tablet 1 unk at prn  . potassium chloride (KLOR-CON) 10 MEQ tablet Take 10 mEq by mouth daily.   12/11/2020 at Unknown time  . rosuvastatin (CRESTOR) 10 MG tablet Take 1 tablet (10 mg total) by mouth daily. 30 tablet 0 12/10/2020 at Unknown time  . tamsulosin (FLOMAX) 0.4 MG CAPS capsule Take 0.4 mg by mouth daily.   12/11/2020 at Unknown time  . triamcinolone (KENALOG) 0.1 % Apply 1 application topically 2 (two) times daily.   12/10/2020 at Unknown time  . Zinc Oxide 16 % OINT Apply 1 application topically 2 (two) times daily.   12/10/2020 at Unknown time  . nicotine (NICODERM CQ - DOSED IN MG/24 HR) 7 mg/24hr patch Place 1 patch (7 mg  total) onto the skin daily. (Patient not taking: Reported on 12/11/2020) 30 patch 1 Not Taking at Unknown time    Review of Systems  Constitutional: Negative for decreased appetite, malaise/fatigue, weight gain and weight loss.  HENT: Negative for congestion.   Eyes: Negative for visual disturbance.  Cardiovascular: Negative for chest pain, dyspnea on exertion, leg swelling, palpitations and syncope.  Respiratory: Negative for cough.   Endocrine: Negative for cold intolerance.  Hematologic/Lymphatic: Does not bruise/bleed easily.  Skin: Negative for itching and rash.  Musculoskeletal: Negative for myalgias.  Gastrointestinal: Negative for abdominal pain, nausea and vomiting.  Genitourinary: Negative for dysuria.  Neurological: Negative for dizziness and weakness.  Psychiatric/Behavioral: The patient is not nervous/anxious.   All other systems reviewed and are negative.     Physical Exam: Physical Exam Vitals and nursing note reviewed.  Constitutional:      General: He is  not in acute distress.    Appearance: He is well-developed.  HENT:     Head: Normocephalic and atraumatic.  Eyes:     Conjunctiva/sclera: Conjunctivae normal.     Pupils: Pupils are equal, round, and reactive to light.  Neck:     Vascular: No JVD.  Cardiovascular:     Rate and Rhythm: Normal rate and regular rhythm.     Pulses: Intact distal pulses.          Femoral pulses are 2+ on the right side and 1+ on the left side.      Dorsalis pedis pulses are 0 on the right side.       Posterior tibial pulses are 0 on the right side.     Heart sounds: No murmur heard.     Comments: Lt AKA Mild eschar at the amputation wound Pulmonary:     Effort: Pulmonary effort is normal.     Breath sounds: Normal breath sounds. No wheezing or rales.  Abdominal:     General: Bowel sounds are normal.     Palpations: Abdomen is soft.     Tenderness: There is no rebound.  Musculoskeletal:        General: No tenderness.  Normal range of motion.     Left lower leg: No edema.  Lymphadenopathy:     Cervical: No cervical adenopathy.  Skin:    General: Skin is warm and dry.  Neurological:     Mental Status: He is alert and oriented to person, place, and time.     Cranial Nerves: No cranial nerve deficit.      Labs:   Lab Results  Component Value Date   WBC 8.2 12/12/2020   HGB 11.0 (L) 12/12/2020   HCT 36.3 (L) 12/12/2020   MCV 93.3 12/12/2020   PLT 214 12/12/2020    Recent Labs  Lab 12/11/20 1020 12/11/20 1027 12/12/20 0130  NA 143   < > 143  K 3.6   < > 3.7  CL 110   < > 110  CO2 24  --  24  BUN 11   < > 13  CREATININE 1.03   < > 0.98  CALCIUM 8.9  --  8.6*  PROT 6.5  --   --   BILITOT 0.8  --   --   ALKPHOS 76  --   --   ALT 10  --   --   AST 17  --   --   GLUCOSE 95   < > 105*   < > = values in this interval not displayed.    Lipid Panel     Component Value Date/Time   CHOL 130 12/12/2020 0130   CHOL 95 (L) 09/11/2020 0806   TRIG 71 12/12/2020 0130   HDL 42 12/12/2020 0130   HDL 30 (L) 09/11/2020 0806   CHOLHDL 3.1 12/12/2020 0130   VLDL 14 12/12/2020 0130   LDLCALC 74 12/12/2020 0130   LDLCALC 49 09/11/2020 0806    BNP (last 3 results) Recent Labs    08/03/20 0644 10/22/20 1054 10/30/20 1820  BNP 751.4* 261.5* 488.4*    HEMOGLOBIN A1C Lab Results  Component Value Date   HGBA1C 4.7 (L) 12/12/2020   MPG 88.19 12/12/2020    Cardiac Panel (last 3 results) Recent Labs    10/17/20 1413  CKTOTAL 486*    Lab Results  Component Value Date   CKTOTAL 486 (H) 10/17/2020     TSH Recent Labs  10/31/20 0429 11/10/20 0544  TSH 10.137* 4.755*      Radiology: CT Code Stroke CTA Head W/WO contrast  Result Date: 12/11/2020 CLINICAL DATA:  Left facial droop, weakness EXAM: CT ANGIOGRAPHY HEAD AND NECK TECHNIQUE: Multidetector CT imaging of the head and neck was performed using the standard protocol during bolus administration of intravenous contrast.  Multiplanar CT image reconstructions and MIPs were obtained to evaluate the vascular anatomy. Carotid stenosis measurements (when applicable) are obtained utilizing NASCET criteria, using the distal internal carotid diameter as the denominator. CONTRAST:  67mL OMNIPAQUE IOHEXOL 350 MG/ML SOLN COMPARISON:  None. FINDINGS: CTA NECK Aortic arch: Mixed plaque along the arch and great vessel origins. Right carotid system: Patent. Atherosclerotic wall thickening and irregularity along the common carotid. Mild calcified plaque along the proximal internal carotid with less than 50% stenosis. Left carotid system: Patent. Noncalcified plaque at the common carotid origin causes approximately 50% stenosis. Atherosclerotic wall thickening and irregularity along the common carotid. Mild mixed plaque at the ICA origin without measurable stenosis. Vertebral arteries: Extracranial right vertebral artery is patent. There is small caliber of the extracranial left vertebral artery with diminished opacification. Calcified plaque along V1 segment. Skeleton: Degenerative changes of the included spine. Other neck: No mass or adenopathy. Upper chest: Emphysema. Review of the MIP images confirms the above findings CTA HEAD Anterior circulation: Intracranial internal carotid arteries are patent with mixed plaque causing up to moderate stenosis on the right mild stenosis on left. Anterior cerebral arteries are patent. 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 Anterior communicating artery is present. Middle cerebral arteries are patent. Atherosclerotic irregularity of distal branches bilaterally including suspected high-grade stenosis of a distal right M2 MCA branch. Posterior circulation: Intracranial right vertebral artery is patent. Intracranial left vertebral artery is patent with atherosclerotic irregularity and focal calcified plaque proximally causing marked stenosis. Basilar artery is patent. Posterior cerebral arteries are patent. Moderate  stenosis of the distal left P2 PCA. Venous sinuses: Patent as allowed by contrast bolus timing. Review of the MIP images confirms the above findings IMPRESSION: Atherosclerosis without large vessel occlusion. No hemodynamically significant stenosis at the ICA origins. Likely chronically small caliber extracranial left vertebral artery with diminished opacification. Atherosclerotic irregularity of distal ACA and MCA branches including suspected high-grade stenosis of distal right M2 MCA branch. Intracranial left vertebral artery atherosclerosis with focal marked stenosis proximally. Moderate stenosis of distal left P2 PCA. Electronically Signed   By: Macy Mis M.D.   On: 12/11/2020 11:22   CT Code Stroke CTA Neck W/WO contrast  Result Date: 12/11/2020 CLINICAL DATA:  Left facial droop, weakness EXAM: CT ANGIOGRAPHY HEAD AND NECK TECHNIQUE: Multidetector CT imaging of the head and neck was performed using the standard protocol during bolus administration of intravenous contrast. Multiplanar CT image reconstructions and MIPs were obtained to evaluate the vascular anatomy. Carotid stenosis measurements (when applicable) are obtained utilizing NASCET criteria, using the distal internal carotid diameter as the denominator. CONTRAST:  61mL OMNIPAQUE IOHEXOL 350 MG/ML SOLN COMPARISON:  None. FINDINGS: CTA NECK Aortic arch: Mixed plaque along the arch and great vessel origins. Right carotid system: Patent. Atherosclerotic wall thickening and irregularity along the common carotid. Mild calcified plaque along the proximal internal carotid with less than 50% stenosis. Left carotid system: Patent. Noncalcified plaque at the common carotid origin causes approximately 50% stenosis. Atherosclerotic wall thickening and irregularity along the common carotid. Mild mixed plaque at the ICA origin without measurable stenosis. Vertebral arteries: Extracranial right vertebral artery is patent. There is  small caliber of the  extracranial left vertebral artery with diminished opacification. Calcified plaque along V1 segment. Skeleton: Degenerative changes of the included spine. Other neck: No mass or adenopathy. Upper chest: Emphysema. Review of the MIP images confirms the above findings CTA HEAD Anterior circulation: Intracranial internal carotid arteries are patent with mixed plaque causing up to moderate stenosis on the right mild stenosis on left. Anterior cerebral arteries are patent. 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 Anterior communicating artery is present. Middle cerebral arteries are patent. Atherosclerotic irregularity of distal branches bilaterally including suspected high-grade stenosis of a distal right M2 MCA branch. Posterior circulation: Intracranial right vertebral artery is patent. Intracranial left vertebral artery is patent with atherosclerotic irregularity and focal calcified plaque proximally causing marked stenosis. Basilar artery is patent. Posterior cerebral arteries are patent. Moderate stenosis of the distal left P2 PCA. Venous sinuses: Patent as allowed by contrast bolus timing. Review of the MIP images confirms the above findings IMPRESSION: Atherosclerosis without large vessel occlusion. No hemodynamically significant stenosis at the ICA origins. Likely chronically small caliber extracranial left vertebral artery with diminished opacification. Atherosclerotic irregularity of distal ACA and MCA branches including suspected high-grade stenosis of distal right M2 MCA branch. Intracranial left vertebral artery atherosclerosis with focal marked stenosis proximally. Moderate stenosis of distal left P2 PCA. Electronically Signed   By: Macy Mis M.D.   On: 12/11/2020 11:22   MR BRAIN WO CONTRAST  Result Date: 12/12/2020 CLINICAL DATA:  Stroke, follow-up. EXAM: MRI HEAD WITHOUT CONTRAST TECHNIQUE: Multiplanar, multiecho pulse sequences of the brain and surrounding structures were obtained without  intravenous contrast. COMPARISON:  Noncontrast head CT 12/11/2020, CT angiogram head/neck 12/11/2020. FINDINGS: Brain: Mild cerebral atrophy. 3.2 cm cortically based infarct within the paramedian left frontal lobe (ACA vascular territory). There is some diffusion-weighted hyperintensity at this site with predominant T2 shine through on the [facility] map. This infarct is late subacute or chronic. Confluent and patchy acute cortical infarcts within the right MCA vascular territory within the right frontoparietal operculum and right parietal lobe more posteriorly and superiorly. Additional small patchy acute cortically based right PCA territory infarcts within the right parietooccipital lobes. 12 mm acute infarct within the right callosal splenium. No significant mass effect at this time. No hemorrhagic conversion. Diffusion-weighted hyperintensity at site of a lacunar infarct within the mid right frontal lobe (series 4, image 36). However, there is T2 shine through at this site on the [facility] map and this is likely late subacute. Background moderate multifocal T2/FLAIR hyperintensity within the cerebral white matter is nonspecific, but compatible with chronic small vessel ischemic disease. Small chronic left thalamic lacunar infarct. No evidence of intracranial mass. No extra-axial fluid collection. No midline shift. Vascular: Signal abnormality within the left vertebral artery at the level of the skull base corresponding with the high-grade stenosis demonstrated on the CT a performed earlier today. Otherwise, expected proximal arterial flow voids. Skull and upper cervical spine: No focal marrow lesion. Sinuses/Orbits: Visualized orbits show no acute finding. No significant paranasal sinus disease at the imaged levels. IMPRESSION: Acute right MCA territory cortical infarcts within the right frontoparietal operculum and more posteriorly and superiorly within the right parietal lobe. Acute right PCA territory cortical infarcts  within the right parietooccipital lobes. Additional 12 mm acute infarct within the right callosal splenium. Late subacute or chronic cortical infarct within the paramedian left frontal lobe (ACA vascular territory). Late subacute lacunar infarct within the mid right frontal lobe white matter. Background moderate cerebral white matter chronic small  vessel ischemic disease. Small chronic left thalamic lacunar infarct. Mild cerebral atrophy. Electronically Signed   By: Kellie Simmering DO   On: 12/12/2020 11:56   ECHOCARDIOGRAM COMPLETE  Result Date: 12/11/2020    ECHOCARDIOGRAM REPORT   Patient Name:   KHAMERON GRUENWALD Date of Exam: 12/11/2020 Medical Rec #:  [id number]         Height: Accession #:    [id number]        Weight: Date of Birth:  Jun 18, 1961          BSA: Patient Age:    77 years          BP:           116/62 mmHg Patient Gender: M                 HR:           75 bpm. Exam Location:  Inpatient Procedure: 2D Echo Indications:    stroke 434.91  History:        Patient has prior history of Echocardiogram examinations, most                 recent 08/02/2020. CAD; Risk Factors:Current Smoker.  Sonographer:    Johny Chess Referring Phys: [id number] [place] XU IMPRESSIONS  1. Left ventricular ejection fraction, by estimation, is 30 to 35%. The left ventricle has moderately decreased function. There is severe hypokinesis of the entire inferior and inferolateral walls. The rest of the LV segments are moderately hypokinetic.  The left ventricular internal cavity size was mildly dilated. There is mild concentric left ventricular hypertrophy. Left ventricular diastolic parameters are consistent with Grade II diastolic dysfunction (pseudonormalization).  2. Right ventricular systolic function is normal. The right ventricular size is normal.  3. The mitral valve is normal in structure. Mild mitral valve regurgitation.  4. The aortic valve is tricuspid. There is mild calcification of the aortic valve. There is mild  thickening of the aortic valve. Aortic valve regurgitation is not visualized.  5. The inferior vena cava is normal in size with greater than 50% respiratory variability, suggesting right atrial pressure of 3 mmHg. Comparison(s): Compared to prior echo on 08/02/2020, there is no significant change. Conclusion(s)/Recommendation(s): No intracardiac source of embolism detected on this transthoracic study. A transesophageal echocardiogram is recommended to exclude cardiac source of embolism if clinically indicated. FINDINGS  Left Ventricle: Left ventricular ejection fraction, by estimation, is 30 to 35%. The left ventricle has moderately decreased function. The left ventricle demonstrates regional wall motion abnormalities. Severe hypokinesis of the entire inferior wall and  inferolateral wall. The rest of the LV segments are moderately hypokinetic. The left ventricular internal cavity size was mildly dilated. There is mild concentric left ventricular hypertrophy. Left ventricular diastolic parameters are consistent with Grade II diastolic dysfunction (pseudonormalization). Right Ventricle: The right ventricular size is normal. No increase in right ventricular wall thickness. Right ventricular systolic function is normal. Left Atrium: Left atrial size was normal in size. Right Atrium: Right atrial size was normal in size. Pericardium: There is no evidence of pericardial effusion. Mitral Valve: The mitral valve is normal in structure. There is mild thickening of the mitral valve leaflet(s). Mild mitral annular calcification. Mild mitral valve regurgitation. Tricuspid Valve: The tricuspid valve is normal in structure. Tricuspid valve regurgitation is trivial. Aortic Valve: The aortic valve is tricuspid. There is mild calcification of the aortic valve. There is mild thickening of the aortic valve. Aortic valve regurgitation is not visualized. Pulmonic  Valve: The pulmonic valve was normal in structure. Pulmonic valve  regurgitation is trivial. Aorta: The aortic root and ascending aorta are structurally normal, with no evidence of dilitation. Venous: The inferior vena cava is normal in size with greater than 50% respiratory variability, suggesting right atrial pressure of 3 mmHg. IAS/Shunts: No atrial level shunt detected by color flow Doppler. Gwyndolyn Kaufman MD Electronically signed by Gwyndolyn Kaufman MD Signature Date/Time: 12/11/2020/5:50:11 PM    Final    CT HEAD CODE STROKE WO CONTRAST  Result Date: 12/11/2020 CLINICAL DATA:  Code stroke. Neuro deficit. Left facial weakness slurred speech EXAM: CT HEAD WITHOUT CONTRAST TECHNIQUE: Contiguous axial images were obtained from the base of the skull through the vertex without intravenous contrast. COMPARISON:  None. FINDINGS: Brain: Hypodensity in the left anterior frontal lobe compatible with infarction of indeterminate age. Scattered small white matter hypodensities bilaterally, mild in degree. Ventricle size normal. No acute hemorrhage or mass lesion. Vascular: Negative for hyperdense vessel Skull: Negative Sinuses/Orbits: Paranasal sinuses clear.  Negative orbit. Other: None ASPECTS ([place] Stroke Program Early CT Score) - Ganglionic level infarction (caudate, lentiform nuclei, internal capsule, insula, M1-M3 cortex): 7 - Supraganglionic infarction (M4-M6 cortex): 3 Total score (0-10 with 10 being normal): 10 IMPRESSION: 1. Hypodensity left anterior frontal lobe compatible with infarct in the ACA territory. This is likely chronic but could be subacute. No acute hemorrhage. 2. ASPECTS is 10 3. These results were called by telephone at the time of interpretation on 12/11/2020 at 10:33 am to provider Bhagat, who verbally acknowledged these results. Electronically Signed   By: Franchot Gallo M.D.   On: 12/11/2020 10:35    Scheduled Meds: . aspirin EC  81 mg Oral Daily  . clopidogrel  75 mg Oral Daily  . gabapentin  300 mg Oral BID  . pantoprazole  40 mg Oral QHS   . rivaroxaban  2.5 mg Oral BID  . rosuvastatin  20 mg Oral Daily  . tamsulosin  0.4 mg Oral Daily   Continuous Infusions: PRN Meds:.acetaminophen **OR** acetaminophen (TYLENOL) oral liquid 160 mg/5 mL **OR** acetaminophen, senna-docusate  CARDIAC STUDIES:  Echocardiogram 12/11/2020: 1. Left ventricular ejection fraction, by estimation, is 30 to 35%. The  left ventricle has moderately decreased function. There is severe  hypokinesis of the entire inferior and inferolateral walls. The rest of  the LV segments are moderately hypokinetic.  The left ventricular internal cavity size was mildly dilated. There is  mild concentric left ventricular hypertrophy. Left ventricular diastolic  parameters are consistent with Grade II diastolic dysfunction  (pseudonormalization).  2. Right ventricular systolic function is normal. The right ventricular  size is normal.  3. The mitral valve is normal in structure. Mild mitral valve  regurgitation.  4. The aortic valve is tricuspid. There is mild calcification of the  aortic valve. There is mild thickening of the aortic valve. Aortic valve  regurgitation is not visualized.  5. The inferior vena cava is normal in size with greater than 50%  respiratory variability, suggesting right atrial pressure of 3 mmHg.  No significant change compared to previous study on 08/02/2020  EKG11/01/2020: Sinus rhythm 73 bpm Nonspecific ST elevation inferior leads. Not STEMI  PV intervention 10/16/2020: Successful intravascular lithotripsy, PTCA and stenting 7.0X39 mm balloon expandable Viabahn VBX stent Rt common iliac artery  7.0X60 mm self expanding Absolute Pro stent Rt distal iliac artery  No named vessels at ankle level in both lower extremities.  Will consult vascular surgery for Rt fem-to-left profunda bypass given  left foot critical limb ischemia. Coronary intervention 08/03/2020: LM: Distal 40% stenosis LAD:Mid 30% disease LCx:  Subtotally occluded OM2, prox LCx 70% stenosis Successful percutaneous coronary intervention OM2-Prox LCx PTCA and overlapping stents placement  2.5 X 38 mm and 2.5 X 18 mm Resolute Onyx drug-eluting stents 100%--->0% stenosis. TIMI flow 0-->III Small caliber distal vessel with moderate diffuse disease RCA: Prox 30% stenosis. Patent mid RCA sttent 2.5 X 26 mm Resolute Onyx drug-eluting stent  LVEDP 42 mmHg  Echocardiogram 08/02/2020: 1. Moderate global and severe inferolateral hypokinesis. Left ventricular  ejection fraction, by estimation, is 30 to 35%. The left ventricle has  normal function. The left ventricle demonstrates regional wall motion  abnormalities (see scoring  diagram/findings for description). There is mild left ventricular  hypertrophy. Left ventricular diastolic parameters are consistent with  Grade II diastolic dysfunction (pseudonormalization).  2. Right ventricular systolic function is low normal. The right  ventricular size is normal.  3. Left atrial size was mildly dilated.  4. The mitral valve is grossly normal. Moderate mitral valve  regurgitation.  5. The aortic valve is tricuspid. Aortic valve regurgitation is not  visualized. Mild aortic valve sclerosis is present, with no evidence of  aortic valve stenosis.  6. The inferior vena cava is dilated in size with >50% respiratory  variability, suggesting right atrial pressure of 8 mmHg.   Coronary intervention 08/01/2020: LM: Distal 30% stenosis LAD:Mid 30% disease LCx: Subtotally occluded OM2, bridging left-to-left and right-to-left collaterals RCA: Prox 30% stenosis. Mid 100% occlusion Successful percutaneous coronary intervention mid RCA PTCA and stent placement 2.5 X 26 mm Resolute Onyx drug-eluting stent 100%--->0% stenosis. TIMI flow 0-->III  Assessment & Recommendations:  59 y.o.Caucasian male with CAD, culprit (RCA) and  nonculprit (LCx) PCI after STEMI 07/2020, HFrEF, moderate MR, h/o cardiac arrest 2/2 Torsades post PCI during index hospitalization, PAD with critical limb ischemia s/p Rt iliac revascularization, and s/p Lt AKA amputation for critical limb ischemia,  severe bilateral renal artery stenoses w/h/o CIN, now admitted with stroke s/p tPA with resolution of neurodeficit, multifocal stroke on MRI suspicious for cardioembolic source  Stroke: Suspicion for multifocal stroke. Recommend TEE, scheduled for 12/14/2020. Will arrange outpatient external monitor. With possibility of ICD placement in the future, will hold off loop recorder placement for now.   CAD: S/p STEMI 07/2020. Currently no angina symptoms Conitnue Aspirin/plavix, Crestor. Given severe CAD, PAD, added Xarelto 2.5 mg bid (COMPASS trial) Okay to resume Crestor at 10 mg, will up titrate outpatient.  Continue carvedilol 3/125 mg bid  HFrEF: Euvolumic. EF stable at 30-35%. Not on beta blocker due to h/o sinus pauses.  While inpatient, will challenge with low dose Entresto, previously stopped due to AKI in the past. Will monitor renal function while inpatient.   PAD: Successful revascularization of right common iliac and right external iliac artery. Now s/p Lt AKA. Recommend wound consult.     Nigel Mormon, MD Pager: [phone] Office: [phone]

## 2020-12-12 NOTE — Consult Note (Signed)
CARDIOLOGY CONSULT NOTE  Patient ID: Mike [name] MRN: [id number] DOB/AGE: 1961/08/05 59 y.o.  Admit date: 12/11/2020 Referring Physician: Rosalin Hawking, MD Reason for Consultation:  Stroke  HPI:   59 y.o.Caucasian male with CAD, culprit (RCA) and nonculprit (LCx) PCI after STEMI 07/2020, HFrEF, moderate MR, h/o cardiac arrest 2/2 [place] post PCI during index hospitalization, PAD with critical limb ischemia s/p Rt iliac revascularization, and s/p Lt AKA amputation for critical limb ischemia,  severe bilateral renal artery stenoses w/h/o CIN, now admitted with stroke s/p tPA with resolution of neurodeficit, multifocal stroke on MRI suspicious for cardioembolic source.  Patient is doing well and denies any complaints at this time.He was doing "phenomenal" in rehab, and was about to discharge when he developed acute onset left arm weakness and sensory loss on 12/14. He underwent tPA with resolution of neurodeficit.    Past Medical History:  Diagnosis Date  . CAD (coronary artery disease)   . Heart attack ([place])   . Hyperlipidemia   . Hypertension   . PAD (peripheral artery disease) ([place])      Past Surgical History:  Procedure Laterality Date  . ABDOMINAL AORTOGRAM W/LOWER EXTREMITY N/A 10/16/2020   Procedure: ABDOMINAL AORTOGRAM W/LOWER EXTREMITY;  Surgeon: Nigel Mormon, MD;  Location: [place] [facility];  Service: Cardiovascular;  Laterality: N/A;  . AMPUTATION Left 11/06/2020   Procedure: AMPUTATION ABOVE KNEE;  Surgeon: Rosetta Posner, MD;  Location: [place];  Service: Vascular;  Laterality: Left;  . CORONARY STENT INTERVENTION N/A 08/03/2020   Procedure: CORONARY STENT INTERVENTION;  Surgeon: Nigel Mormon, MD;  Location: [place] [facility];  Service: Cardiovascular;  Laterality: N/A;  . CORONARY/GRAFT ACUTE MI REVASCULARIZATION N/A 08/01/2020   Procedure: Coronary/Graft Acute MI Revascularization;  Surgeon: Nigel Mormon, MD;  Location: [place] [facility];  Service:  Cardiovascular;  Laterality: N/A;  . INTRAVASCULAR LITHOTRIPSY Right 10/16/2020   Procedure: INTRAVASCULAR LITHOTRIPSY;  Surgeon: Nigel Mormon, MD;  Location: [place] [facility];  Service: Cardiovascular;  Laterality: Right;  Common and External Iliac  . LEFT HEART CATH AND CORONARY ANGIOGRAPHY N/A 08/01/2020   Procedure: LEFT HEART CATH AND CORONARY ANGIOGRAPHY;  Surgeon: Nigel Mormon, MD;  Location: [place] [facility];  Service: Cardiovascular;  Laterality: N/A;  . LEFT HEART CATH AND CORONARY ANGIOGRAPHY N/A 08/03/2020   Procedure: LEFT HEART CATH AND CORONARY ANGIOGRAPHY;  Surgeon: Nigel Mormon, MD;  Location: [place] [facility];  Service: Cardiovascular;  Laterality: N/A;  . PERIPHERAL VASCULAR INTERVENTION Right 10/16/2020   Procedure: PERIPHERAL VASCULAR INTERVENTION;  Surgeon: Nigel Mormon, MD;  Location: [place] [facility];  Service: Cardiovascular;  Laterality: Right;  Common and Iliac Stent      Family History  Problem Relation Age of Onset  . Heart disease Mother   . Heart disease Father   . Cancer Brother      Social History: Social History   Socioeconomic History  . Marital status: Single    Spouse name: Not on file  . Number of children: 1  . Years of education: Not on file  . Highest education level: Not on file  Occupational History  . Not on file  Tobacco Use  . Smoking status: Current Some Day Smoker    Packs/day: 0.50    Years: 40.00    Pack years: 20.00    Types: Cigarettes  . Smokeless tobacco: Never Used  Vaping Use  . Vaping Use: Never used  Substance and Sexual  Activity  . Alcohol use: Yes    Alcohol/week: 10.0 standard drinks    Types: 10 Cans of beer per week    Comment: occ  . Drug use: Never  . Sexual activity: Not on file  Other Topics Concern  . Not on file  Social History Narrative  . Not on file   Social Determinants of Health   Financial Resource Strain: Not on file  Food Insecurity: Not on file   Transportation Needs: Not on file  Physical Activity: Not on file  Stress: Not on file  Social Connections: Not on file  Intimate Partner Violence: Not on file     Medications Prior to Admission  Medication Sig Dispense Refill Last Dose  . aspirin 81 MG EC tablet Take 1 tablet (81 mg total) by mouth daily. Swallow whole. 90 tablet 3 12/11/2020 at 0800  . clopidogrel (PLAVIX) 75 MG tablet Take 1 tablet (75 mg total) by mouth daily. 30 tablet 0 12/11/2020 at 0800  . furosemide (LASIX) 20 MG tablet Take 20 mg by mouth daily.   12/11/2020 at Unknown time  . gabapentin (NEURONTIN) 300 MG capsule Take 1 capsule (300 mg total) by mouth 2 (two) times daily. (Patient taking differently: Take 300-600 mg by mouth See admin instructions. Take 1 capsule (300 mg totally) by mouth in the morning; take 2 capsules (600 mg totally) by mouth at bed time) 60 capsule 0 12/11/2020 at Unknown time  . liver oil-zinc oxide (DESITIN) 40 % ointment Apply 1 application topically daily as needed for irritation.   unk at prn  . nitroGLYCERIN (NITROSTAT) 0.4 MG SL tablet Place 1 tablet (0.4 mg total) under the tongue every 5 (five) minutes as needed for chest pain. (Patient taking differently: Place 0.4 mg under the tongue every 5 (five) minutes as needed for chest pain (max 3 doses).) 30 tablet 1 unk at prn  . potassium chloride (KLOR-CON) 10 MEQ tablet Take 10 mEq by mouth daily.   12/11/2020 at Unknown time  . rosuvastatin (CRESTOR) 10 MG tablet Take 1 tablet (10 mg total) by mouth daily. 30 tablet 0 12/10/2020 at Unknown time  . tamsulosin (FLOMAX) 0.4 MG CAPS capsule Take 0.4 mg by mouth daily.   12/11/2020 at Unknown time  . triamcinolone (KENALOG) 0.1 % Apply 1 application topically 2 (two) times daily.   12/10/2020 at Unknown time  . Zinc Oxide 16 % OINT Apply 1 application topically 2 (two) times daily.   12/10/2020 at Unknown time  . nicotine (NICODERM CQ - DOSED IN MG/24 HR) 7 mg/24hr patch Place 1 patch (7 mg  total) onto the skin daily. (Patient not taking: Reported on 12/11/2020) 30 patch 1 Not Taking at Unknown time    Review of Systems  Constitutional: Negative for decreased appetite, malaise/fatigue, weight gain and weight loss.  HENT: Negative for congestion.   Eyes: Negative for visual disturbance.  Cardiovascular: Negative for chest pain, dyspnea on exertion, leg swelling, palpitations and syncope.  Respiratory: Negative for cough.   Endocrine: Negative for cold intolerance.  Hematologic/Lymphatic: Does not bruise/bleed easily.  Skin: Negative for itching and rash.  Musculoskeletal: Negative for myalgias.  Gastrointestinal: Negative for abdominal pain, nausea and vomiting.  Genitourinary: Negative for dysuria.  Neurological: Negative for dizziness and weakness.  Psychiatric/Behavioral: The patient is not nervous/anxious.   All other systems reviewed and are negative.     Physical Exam: Physical Exam Vitals and nursing note reviewed.  Constitutional:      General: He is  not in acute distress.    Appearance: He is well-developed.  HENT:     Head: Normocephalic and atraumatic.  Eyes:     Conjunctiva/sclera: Conjunctivae normal.     Pupils: Pupils are equal, round, and reactive to light.  Neck:     Vascular: No JVD.  Cardiovascular:     Rate and Rhythm: Normal rate and regular rhythm.     Pulses: Intact distal pulses.          Femoral pulses are 2+ on the right side and 1+ on the left side.      Dorsalis pedis pulses are 0 on the right side.       Posterior tibial pulses are 0 on the right side.     Heart sounds: No murmur heard.     Comments: Lt AKA Mild eschar at the amputation wound Pulmonary:     Effort: Pulmonary effort is normal.     Breath sounds: Normal breath sounds. No wheezing or rales.  Abdominal:     General: Bowel sounds are normal.     Palpations: Abdomen is soft.     Tenderness: There is no rebound.  Musculoskeletal:        General: No tenderness.  Normal range of motion.     Left lower leg: No edema.  Lymphadenopathy:     Cervical: No cervical adenopathy.  Skin:    General: Skin is warm and dry.  Neurological:     Mental Status: He is alert and oriented to person, place, and time.     Cranial Nerves: No cranial nerve deficit.      Labs:   Lab Results  Component Value Date   WBC 8.2 12/12/2020   HGB 11.0 (L) 12/12/2020   HCT 36.3 (L) 12/12/2020   MCV 93.3 12/12/2020   PLT 214 12/12/2020    Recent Labs  Lab 12/11/20 1020 12/11/20 1027 12/12/20 0130  NA 143   < > 143  K 3.6   < > 3.7  CL 110   < > 110  CO2 24  --  24  BUN 11   < > 13  CREATININE 1.03   < > 0.98  CALCIUM 8.9  --  8.6*  PROT 6.5  --   --   BILITOT 0.8  --   --   ALKPHOS 76  --   --   ALT 10  --   --   AST 17  --   --   GLUCOSE 95   < > 105*   < > = values in this interval not displayed.    Lipid Panel     Component Value Date/Time   CHOL 130 12/12/2020 0130   CHOL 95 (L) 09/11/2020 0806   TRIG 71 12/12/2020 0130   HDL 42 12/12/2020 0130   HDL 30 (L) 09/11/2020 0806   CHOLHDL 3.1 12/12/2020 0130   VLDL 14 12/12/2020 0130   LDLCALC 74 12/12/2020 0130   LDLCALC 49 09/11/2020 0806    BNP (last 3 results) Recent Labs    08/03/20 0644 10/22/20 1054 10/30/20 1820  BNP 751.4* 261.5* 488.4*    HEMOGLOBIN A1C Lab Results  Component Value Date   HGBA1C 4.7 (L) 12/12/2020   MPG 88.19 12/12/2020    Cardiac Panel (last 3 results) Recent Labs    10/17/20 1413  CKTOTAL 486*    Lab Results  Component Value Date   CKTOTAL 486 (H) 10/17/2020     TSH Recent Labs  10/31/20 0429 11/10/20 0544  TSH 10.137* 4.755*      Radiology: CT Code Stroke CTA Head W/WO contrast  Result Date: 12/11/2020 CLINICAL DATA:  Left facial droop, weakness EXAM: CT ANGIOGRAPHY HEAD AND NECK TECHNIQUE: Multidetector CT imaging of the head and neck was performed using the standard protocol during bolus administration of intravenous contrast.  Multiplanar CT image reconstructions and MIPs were obtained to evaluate the vascular anatomy. Carotid stenosis measurements (when applicable) are obtained utilizing NASCET criteria, using the distal internal carotid diameter as the denominator. CONTRAST:  39mL OMNIPAQUE IOHEXOL 350 MG/ML SOLN COMPARISON:  None. FINDINGS: CTA NECK Aortic arch: Mixed plaque along the arch and great vessel origins. Right carotid system: Patent. Atherosclerotic wall thickening and irregularity along the common carotid. Mild calcified plaque along the proximal internal carotid with less than 50% stenosis. Left carotid system: Patent. Noncalcified plaque at the common carotid origin causes approximately 50% stenosis. Atherosclerotic wall thickening and irregularity along the common carotid. Mild mixed plaque at the ICA origin without measurable stenosis. Vertebral arteries: Extracranial right vertebral artery is patent. There is small caliber of the extracranial left vertebral artery with diminished opacification. Calcified plaque along V1 segment. Skeleton: Degenerative changes of the included spine. Other neck: No mass or adenopathy. Upper chest: Emphysema. Review of the MIP images confirms the above findings CTA HEAD Anterior circulation: Intracranial internal carotid arteries are patent with mixed plaque causing up to moderate stenosis on the right mild stenosis on left. Anterior cerebral arteries are patent. 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 Anterior communicating artery is present. Middle cerebral arteries are patent. Atherosclerotic irregularity of distal branches bilaterally including suspected high-grade stenosis of a distal right M2 MCA branch. Posterior circulation: Intracranial right vertebral artery is patent. Intracranial left vertebral artery is patent with atherosclerotic irregularity and focal calcified plaque proximally causing marked stenosis. Basilar artery is patent. Posterior cerebral arteries are patent. Moderate  stenosis of the distal left P2 PCA. Venous sinuses: Patent as allowed by contrast bolus timing. Review of the MIP images confirms the above findings IMPRESSION: Atherosclerosis without large vessel occlusion. No hemodynamically significant stenosis at the ICA origins. Likely chronically small caliber extracranial left vertebral artery with diminished opacification. Atherosclerotic irregularity of distal ACA and MCA branches including suspected high-grade stenosis of distal right M2 MCA branch. Intracranial left vertebral artery atherosclerosis with focal marked stenosis proximally. Moderate stenosis of distal left P2 PCA. Electronically Signed   By: Macy Mis M.D.   On: 12/11/2020 11:22   CT Code Stroke CTA Neck W/WO contrast  Result Date: 12/11/2020 CLINICAL DATA:  Left facial droop, weakness EXAM: CT ANGIOGRAPHY HEAD AND NECK TECHNIQUE: Multidetector CT imaging of the head and neck was performed using the standard protocol during bolus administration of intravenous contrast. Multiplanar CT image reconstructions and MIPs were obtained to evaluate the vascular anatomy. Carotid stenosis measurements (when applicable) are obtained utilizing NASCET criteria, using the distal internal carotid diameter as the denominator. CONTRAST:  14mL OMNIPAQUE IOHEXOL 350 MG/ML SOLN COMPARISON:  None. FINDINGS: CTA NECK Aortic arch: Mixed plaque along the arch and great vessel origins. Right carotid system: Patent. Atherosclerotic wall thickening and irregularity along the common carotid. Mild calcified plaque along the proximal internal carotid with less than 50% stenosis. Left carotid system: Patent. Noncalcified plaque at the common carotid origin causes approximately 50% stenosis. Atherosclerotic wall thickening and irregularity along the common carotid. Mild mixed plaque at the ICA origin without measurable stenosis. Vertebral arteries: Extracranial right vertebral artery is patent. There is  small caliber of the  extracranial left vertebral artery with diminished opacification. Calcified plaque along V1 segment. Skeleton: Degenerative changes of the included spine. Other neck: No mass or adenopathy. Upper chest: Emphysema. Review of the MIP images confirms the above findings CTA HEAD Anterior circulation: Intracranial internal carotid arteries are patent with mixed plaque causing up to moderate stenosis on the right mild stenosis on left. Anterior cerebral arteries are patent. 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 Anterior communicating artery is present. Middle cerebral arteries are patent. Atherosclerotic irregularity of distal branches bilaterally including suspected high-grade stenosis of a distal right M2 MCA branch. Posterior circulation: Intracranial right vertebral artery is patent. Intracranial left vertebral artery is patent with atherosclerotic irregularity and focal calcified plaque proximally causing marked stenosis. Basilar artery is patent. Posterior cerebral arteries are patent. Moderate stenosis of the distal left P2 PCA. Venous sinuses: Patent as allowed by contrast bolus timing. Review of the MIP images confirms the above findings IMPRESSION: Atherosclerosis without large vessel occlusion. No hemodynamically significant stenosis at the ICA origins. Likely chronically small caliber extracranial left vertebral artery with diminished opacification. Atherosclerotic irregularity of distal ACA and MCA branches including suspected high-grade stenosis of distal right M2 MCA branch. Intracranial left vertebral artery atherosclerosis with focal marked stenosis proximally. Moderate stenosis of distal left P2 PCA. Electronically Signed   By: Macy Mis M.D.   On: 12/11/2020 11:22   MR BRAIN WO CONTRAST  Result Date: 12/12/2020 CLINICAL DATA:  Stroke, follow-up. EXAM: MRI HEAD WITHOUT CONTRAST TECHNIQUE: Multiplanar, multiecho pulse sequences of the brain and surrounding structures were obtained without  intravenous contrast. COMPARISON:  Noncontrast head CT 12/11/2020, CT angiogram head/neck 12/11/2020. FINDINGS: Brain: Mild cerebral atrophy. 3.2 cm cortically based infarct within the paramedian left frontal lobe (ACA vascular territory). There is some diffusion-weighted hyperintensity at this site with predominant T2 shine through on the [facility] map. This infarct is late subacute or chronic. Confluent and patchy acute cortical infarcts within the right MCA vascular territory within the right frontoparietal operculum and right parietal lobe more posteriorly and superiorly. Additional small patchy acute cortically based right PCA territory infarcts within the right parietooccipital lobes. 12 mm acute infarct within the right callosal splenium. No significant mass effect at this time. No hemorrhagic conversion. Diffusion-weighted hyperintensity at site of a lacunar infarct within the mid right frontal lobe (series 4, image 36). However, there is T2 shine through at this site on the [facility] map and this is likely late subacute. Background moderate multifocal T2/FLAIR hyperintensity within the cerebral white matter is nonspecific, but compatible with chronic small vessel ischemic disease. Small chronic left thalamic lacunar infarct. No evidence of intracranial mass. No extra-axial fluid collection. No midline shift. Vascular: Signal abnormality within the left vertebral artery at the level of the skull base corresponding with the high-grade stenosis demonstrated on the CT a performed earlier today. Otherwise, expected proximal arterial flow voids. Skull and upper cervical spine: No focal marrow lesion. Sinuses/Orbits: Visualized orbits show no acute finding. No significant paranasal sinus disease at the imaged levels. IMPRESSION: Acute right MCA territory cortical infarcts within the right frontoparietal operculum and more posteriorly and superiorly within the right parietal lobe. Acute right PCA territory cortical infarcts  within the right parietooccipital lobes. Additional 12 mm acute infarct within the right callosal splenium. Late subacute or chronic cortical infarct within the paramedian left frontal lobe (ACA vascular territory). Late subacute lacunar infarct within the mid right frontal lobe white matter. Background moderate cerebral white matter chronic small  vessel ischemic disease. Small chronic left thalamic lacunar infarct. Mild cerebral atrophy. Electronically Signed   By: Kellie Simmering DO   On: 12/12/2020 11:56   ECHOCARDIOGRAM COMPLETE  Result Date: 12/11/2020    ECHOCARDIOGRAM REPORT   Patient Name:   Mike [name] Date of Exam: 12/11/2020 Medical Rec #:  [id number]         Height: Accession #:    [id number]        Weight: Date of Birth:  [birth year]          BSA: Patient Age:    1 years          BP:           116/62 mmHg Patient Gender: M                 HR:           75 bpm. Exam Location:  Inpatient Procedure: 2D Echo Indications:    stroke 434.91  History:        Patient has prior history of Echocardiogram examinations, most                 recent 08/02/2020. CAD; Risk Factors:Current Smoker.  Sonographer:    Johny Chess Referring Phys: [id number] [place] XU IMPRESSIONS  1. Left ventricular ejection fraction, by estimation, is 30 to 35%. The left ventricle has moderately decreased function. There is severe hypokinesis of the entire inferior and inferolateral walls. The rest of the LV segments are moderately hypokinetic.  The left ventricular internal cavity size was mildly dilated. There is mild concentric left ventricular hypertrophy. Left ventricular diastolic parameters are consistent with Grade II diastolic dysfunction (pseudonormalization).  2. Right ventricular systolic function is normal. The right ventricular size is normal.  3. The mitral valve is normal in structure. Mild mitral valve regurgitation.  4. The aortic valve is tricuspid. There is mild calcification of the aortic valve. There is mild  thickening of the aortic valve. Aortic valve regurgitation is not visualized.  5. The inferior vena cava is normal in size with greater than 50% respiratory variability, suggesting right atrial pressure of 3 mmHg. Comparison(s): Compared to prior echo on 08/02/2020, there is no significant change. Conclusion(s)/Recommendation(s): No intracardiac source of embolism detected on this transthoracic study. A transesophageal echocardiogram is recommended to exclude cardiac source of embolism if clinically indicated. FINDINGS  Left Ventricle: Left ventricular ejection fraction, by estimation, is 30 to 35%. The left ventricle has moderately decreased function. The left ventricle demonstrates regional wall motion abnormalities. Severe hypokinesis of the entire inferior wall and  inferolateral wall. The rest of the LV segments are moderately hypokinetic. The left ventricular internal cavity size was mildly dilated. There is mild concentric left ventricular hypertrophy. Left ventricular diastolic parameters are consistent with Grade II diastolic dysfunction (pseudonormalization). Right Ventricle: The right ventricular size is normal. No increase in right ventricular wall thickness. Right ventricular systolic function is normal. Left Atrium: Left atrial size was normal in size. Right Atrium: Right atrial size was normal in size. Pericardium: There is no evidence of pericardial effusion. Mitral Valve: The mitral valve is normal in structure. There is mild thickening of the mitral valve leaflet(s). Mild mitral annular calcification. Mild mitral valve regurgitation. Tricuspid Valve: The tricuspid valve is normal in structure. Tricuspid valve regurgitation is trivial. Aortic Valve: The aortic valve is tricuspid. There is mild calcification of the aortic valve. There is mild thickening of the aortic valve. Aortic valve regurgitation is not visualized. Pulmonic  Valve: The pulmonic valve was normal in structure. Pulmonic valve  regurgitation is trivial. Aorta: The aortic root and ascending aorta are structurally normal, with no evidence of dilitation. Venous: The inferior vena cava is normal in size with greater than 50% respiratory variability, suggesting right atrial pressure of 3 mmHg. IAS/Shunts: No atrial level shunt detected by color flow Doppler. Gwyndolyn Kaufman MD Electronically signed by Gwyndolyn Kaufman MD Signature Date/Time: 12/11/2020/5:50:11 PM    Final    CT HEAD CODE STROKE WO CONTRAST  Result Date: 12/11/2020 CLINICAL DATA:  Code stroke. Neuro deficit. Left facial weakness slurred speech EXAM: CT HEAD WITHOUT CONTRAST TECHNIQUE: Contiguous axial images were obtained from the base of the skull through the vertex without intravenous contrast. COMPARISON:  None. FINDINGS: Brain: Hypodensity in the left anterior frontal lobe compatible with infarction of indeterminate age. Scattered small white matter hypodensities bilaterally, mild in degree. Ventricle size normal. No acute hemorrhage or mass lesion. Vascular: Negative for hyperdense vessel Skull: Negative Sinuses/Orbits: Paranasal sinuses clear.  Negative orbit. Other: None ASPECTS ([place] Stroke Program Early CT Score) - Ganglionic level infarction (caudate, lentiform nuclei, internal capsule, insula, M1-M3 cortex): 7 - Supraganglionic infarction (M4-M6 cortex): 3 Total score (0-10 with 10 being normal): 10 IMPRESSION: 1. Hypodensity left anterior frontal lobe compatible with infarct in the ACA territory. This is likely chronic but could be subacute. No acute hemorrhage. 2. ASPECTS is 10 3. These results were called by telephone at the time of interpretation on 12/11/2020 at 10:33 am to provider Bhagat, who verbally acknowledged these results. Electronically Signed   By: Franchot Gallo M.D.   On: 12/11/2020 10:35    Scheduled Meds: . aspirin EC  81 mg Oral Daily  . clopidogrel  75 mg Oral Daily  . gabapentin  300 mg Oral BID  . pantoprazole  40 mg Oral QHS   . rivaroxaban  2.5 mg Oral BID  . rosuvastatin  20 mg Oral Daily  . tamsulosin  0.4 mg Oral Daily   Continuous Infusions: PRN Meds:.acetaminophen **OR** acetaminophen (TYLENOL) oral liquid 160 mg/5 mL **OR** acetaminophen, senna-docusate  CARDIAC STUDIES:  Echocardiogram 12/11/2020: 1. Left ventricular ejection fraction, by estimation, is 30 to 35%. The  left ventricle has moderately decreased function. There is severe  hypokinesis of the entire inferior and inferolateral walls. The rest of  the LV segments are moderately hypokinetic.  The left ventricular internal cavity size was mildly dilated. There is  mild concentric left ventricular hypertrophy. Left ventricular diastolic  parameters are consistent with Grade II diastolic dysfunction  (pseudonormalization).  2. Right ventricular systolic function is normal. The right ventricular  size is normal.  3. The mitral valve is normal in structure. Mild mitral valve  regurgitation.  4. The aortic valve is tricuspid. There is mild calcification of the  aortic valve. There is mild thickening of the aortic valve. Aortic valve  regurgitation is not visualized.  5. The inferior vena cava is normal in size with greater than 50%  respiratory variability, suggesting right atrial pressure of 3 mmHg.  No significant change compared to previous study on 08/02/2020  EKG11/01/2020: Sinus rhythm 73 bpm Nonspecific ST elevation inferior leads. Not STEMI  PV intervention 10/16/2020: Successful intravascular lithotripsy, PTCA and stenting 7.0X39 mm balloon expandable Viabahn VBX stent Rt common iliac artery  7.0X60 mm self expanding Absolute Pro stent Rt distal iliac artery  No named vessels at ankle level in both lower extremities.  Will consult vascular surgery for Rt fem-to-left profunda bypass given  left foot critical limb ischemia. Coronary intervention 08/03/2020: LM: Distal 40% stenosis LAD:Mid 30% disease LCx:  Subtotally occluded OM2, prox LCx 70% stenosis Successful percutaneous coronary intervention OM2-Prox LCx PTCA and overlapping stents placement  2.5 X 38 mm and 2.5 X 18 mm Resolute Onyx drug-eluting stents 100%--->0% stenosis. TIMI flow 0-->III Small caliber distal vessel with moderate diffuse disease RCA: Prox 30% stenosis. Patent mid RCA sttent 2.5 X 26 mm Resolute Onyx drug-eluting stent  LVEDP 42 mmHg  Echocardiogram 08/02/2020: 1. Moderate global and severe inferolateral hypokinesis. Left ventricular  ejection fraction, by estimation, is 30 to 35%. The left ventricle has  normal function. The left ventricle demonstrates regional wall motion  abnormalities (see scoring  diagram/findings for description). There is mild left ventricular  hypertrophy. Left ventricular diastolic parameters are consistent with  Grade II diastolic dysfunction (pseudonormalization).  2. Right ventricular systolic function is low normal. The right  ventricular size is normal.  3. Left atrial size was mildly dilated.  4. The mitral valve is grossly normal. Moderate mitral valve  regurgitation.  5. The aortic valve is tricuspid. Aortic valve regurgitation is not  visualized. Mild aortic valve sclerosis is present, with no evidence of  aortic valve stenosis.  6. The inferior vena cava is dilated in size with >50% respiratory  variability, suggesting right atrial pressure of 8 mmHg.   Coronary intervention 08/01/2020: LM: Distal 30% stenosis LAD:Mid 30% disease LCx: Subtotally occluded OM2, bridging left-to-left and right-to-left collaterals RCA: Prox 30% stenosis. Mid 100% occlusion Successful percutaneous coronary intervention mid RCA PTCA and stent placement 2.5 X 26 mm Resolute Onyx drug-eluting stent 100%--->0% stenosis. TIMI flow 0-->III  Assessment & Recommendations:  59 y.o.Caucasian male with CAD, culprit (RCA) and  nonculprit (LCx) PCI after STEMI 07/2020, HFrEF, moderate MR, h/o cardiac arrest 2/2 Torsades post PCI during index hospitalization, PAD with critical limb ischemia s/p Rt iliac revascularization, and s/p Lt AKA amputation for critical limb ischemia,  severe bilateral renal artery stenoses w/h/o CIN, now admitted with stroke s/p tPA with resolution of neurodeficit, multifocal stroke on MRI suspicious for cardioembolic source  Stroke: Suspicion for multifocal stroke. Recommend TEE, scheduled for 12/14/2020. Will arrange outpatient external monitor. With possibility of ICD placement in the future, will hold off loop recorder placement for now.   CAD: S/p STEMI 07/2020. Currently no angina symptoms Conitnue Aspirin/plavix, Crestor. Given severe CAD, PAD, added Xarelto 2.5 mg bid (COMPASS trial) Okay to resume Crestor at 10 mg, will up titrate outpatient.  Continue carvedilol 3/125 mg bid  HFrEF: Euvolumic. EF stable at 30-35%. Not on beta blocker due to h/o sinus pauses.  While inpatient, will challenge with low dose Entresto, previously stopped due to AKI in the past. Will monitor renal function while inpatient.   PAD: Successful revascularization of right common iliac and right external iliac artery. Now s/p Lt AKA. Recommend wound consult.     Nigel Mormon, MD Pager: [phone] Office: [phone]

## 2020-12-12 NOTE — Research (Signed)
Patient meets criteria for the OPTIMISTmain trial. Spoke with patient about the trial and he is aware that his data will be collected. He agrees to complete questions at discharge and at 90 days. Given opportunity to opt out and declined.   Patient Information sheet left at bedside. Handoff given with ED RN Garlon Hatchet who is aware that patient is enrolled in low intensity monitoring starting at 1252.

## 2020-12-12 NOTE — Progress Notes (Signed)
SLP Cancellation Note  Patient Details Name: Mike [name] MRN: [id number] DOB: 21-Feb-1961   Cancelled treatment:       Reason Eval/Treat Not Completed: Patient at procedure or test/unavailable (Pt was previously off unit and is currently working with OT. SLP will f/u on subsequent date unless schedule allows later today.)  Sayge Brienza I. Hardin Negus, [place], [place] Office number [phone] Pager [place] 12/12/2020, 4:01 PM

## 2020-12-12 NOTE — Evaluation (Signed)
Physical Therapy Evaluation Patient Details Name: Mike [name] MRN: [id number] DOB: 1961-03-20 Today's Date: 12/12/2020   History of Present Illness  Pt is a 59 year old male who presented from inpatient rehab at [facility] when he presented with a L side facial droop, L neglect, L downward hemianopia, L arm weakness and sensory loss, and slurred speech. NIHSS on arrival was 5. Administered tPA to pt at 10:51 on 12/11/20. CT showed subacute to chronic L frontal ACA territory infarct but no acute infarct. CTA with high grade stenosis of distal right M2 MCA and LVA atherosclerosis with stenosis proximally. Stenosis of distal left P2 PCA. Awaiting MRI of brain. Pt has medical hx of PAD/gangrene s/p L AKA 11/06/20, HTN, CAD s/p STEMI with stenting 08/18/19, and CHF.  Clinical Impression  Pt recently received a L AKA 11/06/20 and has been receiving PT services at [facility] and he reports being a week away from d/c home when he had this condition arise. He reports and appears to be back at his baseline functional status as he was at [facility]. He denies any numbness/tingling in bilat legs and displays intact dynamic proprioception of his R ankle and intact R leg coordination. He demonstrates mild balance deficits in standing, mostly due to some general strength deficits and endurance deficits which appear to have been present prior to this admission. He required min guard for all transfers and for ambulating ~190 ft with a RW, with reliance on his UEs to provide stability and support with swing-through gait. Will continue to follow acutely. Recommending pt d/c home with [facility] PT as he is aware of his deficits and demonstrates safe management of his RW.   Follow Up Recommendations Home health PT;Supervision - Intermittent    Equipment Recommendations  Rolling walker with 5" wheels;3in1 (PT);Wheelchair (measurements PT);Wheelchair cushion (measurements PT)    Recommendations for Other Services        Precautions / Restrictions Precautions Precautions: Fall Precaution Comments: L AKA November 2021; HTN Restrictions Weight Bearing Restrictions: No      Mobility  Bed Mobility Overal bed mobility: Modified Independent             General bed mobility comments: Pt able to transition supine > sit L EOB safely with mod I with HOB elevated.    Transfers Overall transfer level: Needs assistance Equipment used: Rolling walker (2 wheeled) Transfers: Sit to/from Stand Sit to Stand: Min guard         General transfer comment: Sit to stand transfer safely with proper hand placement, min guard for safety but no overt LOB or significant trunk sway.  Ambulation/Gait Ambulation/Gait assistance: Min guard Gait Distance (Feet): 190 Feet Assistive device: Rolling walker (2 wheeled) Gait Pattern/deviations: Step-through pattern;Trunk flexed (Swing-through) Gait velocity: reduced Gait velocity interpretation: 1.31 - 2.62 ft/sec, indicative of limited community ambulator General Gait Details: Ambulates with swing-through pattern on R leg, displaying dependence on UE support on RW for R swing. No overt LOB but mild unsteadiness noted, min guard for safety. Pt fatigues and slows with increased distance. Appropriate management of RW during gait and turns.  Stairs            Wheelchair Mobility    Modified Rankin (Stroke Patients Only) Modified Rankin (Stroke Patients Only) Pre-Morbid Rankin Score: Moderately severe disability Modified Rankin: Moderately severe disability     Balance Overall balance assessment: Mild deficits observed, not formally tested  Pertinent Vitals/Pain Pain Assessment: No/denies pain    Home Living Family/patient expects to be discharged to:: Private residence Living Arrangements: Alone Available Help at Discharge: Family;Available PRN/intermittently Type of Home: Apartment Home  Access: Level entry;Ramped entrance     Home Layout: One level Home Equipment: Hand held shower head Additional Comments: As far as he is aware, no further equipment has been ordered other than a ramp and hand-held shower head being installed. He has recently been at [facility] inpatient rehab for therapy but plans to eventually return to home.    Prior Function Level of Independence: Independent         Comments: This is based on prior to AKA. With PT at facility he has ambulated up to ~175 ft with a RW and min guard assist from therapists. Has required min guard-supervision and intermittent independence for getting into/out of bed.     Hand Dominance   Dominant Hand: Left    Extremity/Trunk Assessment   Upper Extremity Assessment Upper Extremity Assessment: Defer to OT evaluation    Lower Extremity Assessment Lower Extremity Assessment: Overall WFL for tasks assessed (no reports of numbness/tingling in bilat legs, MMT scores grossly 4 to 5 bilat legs, coordination intact)    Cervical / Trunk Assessment Cervical / Trunk Assessment: Normal  Communication   Communication: No difficulties  Cognition Arousal/Alertness: Awake/alert Behavior During Therapy: WFL for tasks assessed/performed Overall Cognitive Status: Within Functional Limits for tasks assessed                                 General Comments: A&Ox4, reports feeling like he is at baseline.      General Comments General comments (skin integrity, edema, etc.): BP 141/84 supine in bed, 147/89 sitting EOB; HR 100s-127 with gait    Exercises     Assessment/Plan    PT Assessment Patient needs continued PT services  PT Problem List Decreased activity tolerance;Decreased balance;Decreased mobility;Decreased strength       PT Treatment Interventions DME instruction;Gait training;Functional mobility training;Therapeutic activities;Therapeutic exercise;Balance training;Neuromuscular  re-education;Patient/family education    PT Goals (Current goals can be found in the Care Plan section)  Acute Rehab PT Goals Patient Stated Goal: to go home PT Goal Formulation: With patient Time For Goal Achievement: 12/26/20 Potential to Achieve Goals: Good    Frequency Min 2X/week   Barriers to discharge        Co-evaluation               AM-PAC PT "6 Clicks" Mobility  Outcome Measure Help needed turning from your back to your side while in a flat bed without using bedrails?: None Help needed moving from lying on your back to sitting on the side of a flat bed without using bedrails?: None Help needed moving to and from a bed to a chair (including a wheelchair)?: A Little Help needed standing up from a chair using your arms (e.g., wheelchair or bedside chair)?: A Little Help needed to walk in hospital room?: A Little Help needed climbing 3-5 steps with a railing? : A Lot 6 Click Score: 19    End of Session Equipment Utilized During Treatment: Gait belt Activity Tolerance: Patient tolerated treatment well Patient left: in chair;with call bell/phone within reach;with chair alarm set;Other (comment) (with MD in room) Nurse Communication: Mobility status PT Visit Diagnosis: Unsteadiness on feet (R26.81);Other abnormalities of gait and mobility (R26.89);Difficulty in walking, not elsewhere classified (R26.2);Muscle  weakness (generalized) (M62.81)    Time: 5366-4403 PT Time Calculation (min) (ACUTE ONLY): 35 min   Charges:   PT Evaluation $PT Eval Moderate Complexity: 1 Mod PT Treatments $Gait Training: 8-22 mins        Moishe Spice, PT, DPT Acute Rehabilitation Services  Pager: [phone] Office: [phone]   Orvan Falconer 12/12/2020, 4:32 PM

## 2020-12-12 NOTE — Progress Notes (Signed)
STROKE TEAM PROGRESS NOTE   INTERVAL HISTORY No family is at the bedside.  Pt stated that he is back to his baseline and he felt fantastic. However, his MRI showed multifocal acute and subacute infarct. His EF 30-35% unchanged from 07/2020. Concerning for cardioembolic source. Discussed with Dr. Celso Sickle and will do TEE and 30 day cardiac monitoring. Will add Xarelto 2.5mg  bid in addition to the DAPT according to the [facility] trial.   OBJECTIVE Vitals:   12/12/20 0300 12/12/20 0355 12/12/20 0757 12/12/20 1204  BP:  129/82 122/77 140/79  Pulse: 66 62 70 66  Resp: 12 18 16 16   Temp:  98.5 F (36.9 C) 98.2 F (36.8 C) 97.7 F (36.5 C)  TempSrc:  Oral Oral Oral  SpO2: 97% 100% 100% 100%  Weight:  65.8 kg    Height:  6' (1.829 m)      CBC:  Recent Labs  Lab 12/11/20 1020 12/11/20 1027 12/12/20 0130  WBC 7.7  --  8.2  NEUTROABS 5.1  --   --   HGB 12.0* 12.6* 11.0*  HCT 40.3 37.0* 36.3*  MCV 94.6  --  93.3  PLT 221  --  563    Basic Metabolic Panel:  Recent Labs  Lab 12/11/20 1020 12/11/20 1027 12/12/20 0130  NA 143 144 143  K 3.6 3.6 3.7  CL 110 109 110  CO2 24  --  24  GLUCOSE 95 101* 105*  BUN 11 11 13   CREATININE 1.03 0.90 0.98  CALCIUM 8.9  --  8.6*    Lipid Panel:     Component Value Date/Time   CHOL 130 12/12/2020 0130   CHOL 95 (L) 09/11/2020 0806   TRIG 71 12/12/2020 0130   HDL 42 12/12/2020 0130   HDL 30 (L) 09/11/2020 0806   CHOLHDL 3.1 12/12/2020 0130   VLDL 14 12/12/2020 0130   LDLCALC 74 12/12/2020 0130   LDLCALC 49 09/11/2020 0806   HgbA1c:  Lab Results  Component Value Date   HGBA1C 4.7 (L) 12/12/2020   Urine Drug Screen:     Component Value Date/Time   LABOPIA NONE DETECTED 12/11/2020 1143   COCAINSCRNUR NONE DETECTED 12/11/2020 1143   LABBENZ NONE DETECTED 12/11/2020 1143   AMPHETMU NONE DETECTED 12/11/2020 1143   THCU NONE DETECTED 12/11/2020 1143   LABBARB NONE DETECTED 12/11/2020 1143    Alcohol Level     Component Value  Date/Time   ETH <10 12/11/2020 1020    IMAGING  CT Code Stroke CTA Head W/WO contrast CT Code Stroke CTA Neck W/WO contrast 12/11/2020 IMPRESSION:  Atherosclerosis without large vessel occlusion. No hemodynamically significant stenosis at the ICA origins. Likely chronically small caliber extracranial left vertebral artery with diminished opacification. Atherosclerotic irregularity of distal ACA and MCA branches including suspected high-grade stenosis of distal right M2 MCA branch. Intracranial left vertebral artery atherosclerosis with focal marked stenosis proximally. Moderate stenosis of distal left P2 PCA.   CT HEAD CODE STROKE WO CONTRAST 12/11/2020 IMPRESSION:  1. Hypodensity left anterior frontal lobe compatible with infarct in the ACA territory. This is likely chronic but could be subacute. No acute hemorrhage.  2. ASPECTS is 10    MR BRAIN WO CONTRAST 12/12/2020 at 1156 IMPRESSION:  Acute right MCA territory cortical infarcts within the right frontoparietal operculum and more posteriorly and superiorly within the right parietal lobe. Acute right PCA territory cortical infarcts within the right parietooccipital lobes. Additional 12 mm acute infarct within the right callosal splenium. Late subacute or  chronic cortical infarct within the paramedian left frontal lobe (ACA vascular territory). Late subacute lacunar infarct within the mid right frontal lobe white matter. Background moderate cerebral white matter chronic small vessel ischemic disease. Small chronic left thalamic lacunar infarct. Mild cerebral atrophy.   ECHOCARDIOGRAM COMPLETE 12/11/2020 IMPRESSIONS   1. Left ventricular ejection fraction, by estimation, is 30 to 35%. The left ventricle has moderately decreased function. There is severe hypokinesis of the entire inferior and inferolateral walls. The rest of the LV segments are moderately hypokinetic.  The left ventricular internal cavity size was mildly dilated. There is  mild concentric left ventricular hypertrophy. Left ventricular diastolic parameters are consistent with Grade II diastolic dysfunction (pseudonormalization).   2. Right ventricular systolic function is normal. The right ventricular size is normal.   3. The mitral valve is normal in structure. Mild mitral valve regurgitation.   4. The aortic valve is tricuspid. There is mild calcification of the aortic valve. There is mild thickening of the aortic valve. Aortic valve regurgitation is not visualized.   5. The inferior vena cava is normal in size with greater than 50% respiratory variability, suggesting right atrial pressure of 3 mmHg. Comparison(s): Compared to prior echo on 08/02/2020, there is no significant change. Conclusion(s)/Recommendation(s): No intracardiac source of embolism detected on this transthoracic study. A transesophageal echocardiogram is recommended to exclude cardiac source of embolism if clinically indicated.   ECG - SR rate 86 BPM. (See cardiology reading for complete details)  PHYSICAL EXAM  Temp:  [97.7 F (36.5 C)-99 F (37.2 C)] 97.7 F (36.5 C) (12/15 1204) Pulse Rate:  [58-95] 66 (12/15 1204) Resp:  [11-26] 16 (12/15 1204) BP: (112-152)/(62-84) 140/79 (12/15 1204) SpO2:  [95 %-100 %] 100 % (12/15 1204) Weight:  [62.7 kg-65.8 kg] 65.8 kg (12/15 0355)  General - Well nourished, well developed, in no apparent distress.  Ophthalmologic - fundi not visualized due to noncooperation.  Cardiovascular - Regular rhythm and rate.  Mental Status -  Level of arousal and orientation to time, place, and person were intact. Language including expression, naming, repetition, comprehension was assessed and found intact. Attention span and concentration were normal. Fund of Knowledge was assessed and was intact.  Cranial Nerves II - XII - II - Visual field intact OU. III, IV, VI - Extraocular movements intact. V - Facial sensation intact bilaterally. VII - Facial movement  intact bilaterally. VIII - Hearing & vestibular intact bilaterally. X - Palate elevates symmetrically. XI - Chin turning & shoulder shrug intact bilaterally. XII - Tongue protrusion intact.  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  Bulk was normal and fasciculations were absent.   Motor Tone - Muscle tone was assessed at the neck and appendages and was normal.  Reflexes - The patient's reflexes were symmetrical in all extremities and he had no pathological reflexes.  Sensory - Light touch, temperature/pinprick were assessed and were symmetrical.    Coordination - The patient had normal movements in the hands with no ataxia or dysmetria.  Tremor was absent.  Gait and Station - deferred.   ASSESSMENT/PLAN [name] is a 59 y.o. male with history of PAD/gangrene s/p left AKA 11/06/20 by Dr. Donnetta Hutching on ASA and Plavix, HTN, HLD, CAD s/p STEMI with stenting 08/18/19, CHF, and tobacco abuse presenting with LUE weakness and numbness, slurred speech and facial droop. The patient received IV t-PA Tues 12/14 at 1100.  Stroke:  Multifocal  small patchy Rt MCA and PCA acute and left ACA subacute/chronic infarcts - embolic - concerning for cardiac source. However, pt does have multifocal intracranial stenosis.  CT Head - Hypodensity left anterior frontal lobe compatible with infarct in the ACA territory. This is likely chronic but could be subacute. No acute hemorrhage. ASPECTS is 10    CTA H&N - Atherosclerotic irregularity of distal ACA and MCA branches including suspected high-grade stenosis of distal right M2 MCA branch. Intracranial left vertebral artery atherosclerosis with focal marked stenosis proximally. Moderate stenosis of distal left P2 PCA.   MRI head - Acute right MCA territory cortical infarcts within the right frontoparietal operculum and more posteriorly and superiorly within the right parietal lobe. Acute  right PCA territory cortical infarcts within the right parietooccipital lobes. Additional 12 mm acute infarct within the right callosal splenium. Late subacute or chronic cortical infarct within the paramedian left frontal lobe (ACA vascular territory). Late subacute lacunar infarct within the mid right frontal lobe white matter. Small chronic left thalamic lacunar infarct.  2D Echo - EF 30 - 35%. No cardiac source of emboli identified.   TEE pending  Lacey Jensen Virus 2 - negative  LDL - 74  HgbA1c - 4.7  UDS - negative  VTE prophylaxis - SCDs  aspirin 81 mg daily and clopidogrel 75 mg daily prior to admission, now on  ASA 81 mg and plavix DAPT. Will also add Xarelto 2.5mg  bid   Patient counseled to be compliant with his antithrombotic medications  Ongoing aggressive stroke risk factor management  Therapy recommendations:  pending  Disposition:  Pending  PAD S/p left AKA  Followed with vascular surgery  Left AKA surgery 10/2020  In rehab PTA for SNF post surgery  CAD/MI CHF  07/2020 STEMI  S/p stenting  On DAPT  EF 30-35% in 07/2020  Follows with Dr. Celso Sickle as outpt  Hypertension  Home BP meds: lasix  Current BP meds: none   Stable . Long-term BP goal normotensive  Hyperlipidemia  Home Lipid lowering medication: Crestor 10 mg daaily  LDL 74, goal < 70  Current on Crestor to 20 mg daily  Continue statin at discharge  Tobacco abuse  smoker before AKA  Smoking cessation counseling again provided  Pt is willing to quit  Other Stroke Risk Factors  Hx stroke/TIA by imaging  Other Active Problems, Findings and Recommendations  Code status - DNR   Hospital day # 1  Rosalin Hawking, MD PhD Stroke Neurology 12/12/2020 3:09 PM  To contact Stroke Continuity provider, please refer to [email or web address] After hours, contact General Neurology

## 2020-12-12 NOTE — Progress Notes (Signed)
OT Cancellation Note  Patient Details Name: Mike [name] MRN: [id number] DOB: 06-07-1961   Cancelled Treatment:    Reason Eval/Treat Not Completed: Patient not medically ready;Active bedrest order OT order received and appreciated however this conflicts with current bedrest order set. Please increase activity tolerance as appropriate and remove bedrest from orders. . Please contact OT at [phone] if bed rest order is discontinued. OT will hold evaluation at this time and will check back as time allows pending increased activity orders.   Billey Chang, OTR/L  Acute Rehabilitation Services Pager: [phone] Office: [phone] .  12/12/2020, 8:48 AM

## 2020-12-12 NOTE — Progress Notes (Signed)
Clarified Code status with Dr. Cheral Marker and patient. This RN was told on report that patient is a DNR. Previous DNR in chart. Patient was listed as full code. With Dr. Cheral Marker on phone, this RN went into patient's room and asked patient if he wished to be DNR, and then I clarified DNR meaning Do Not Resuscitate. Patient stated he wished to be DNR and to not resuscitate. Will continue to monitor.

## 2020-12-12 NOTE — Plan of Care (Addendum)
TEE is scheduled for Friday with Patwardhan, Reynold Bowen, MD cardiology.  Mikey Bussing PA-C [facility] Pager [phone] 12/12/2020, 3:23 PM

## 2020-12-13 ENCOUNTER — Other Ambulatory Visit (HOSPITAL_COMMUNITY): Payer: BC Managed Care – PPO

## 2020-12-13 DIAGNOSIS — S78112A Complete traumatic amputation at level between left hip and knee, initial encounter: Secondary | ICD-10-CM

## 2020-12-13 DIAGNOSIS — I252 Old myocardial infarction: Secondary | ICD-10-CM

## 2020-12-13 LAB — BASIC METABOLIC PANEL
Anion gap: 10 (ref 5–15)
BUN: 11 mg/dL (ref 8–23)
CO2: 21 mmol/L — ABNORMAL LOW (ref 22–32)
Calcium: 8.9 mg/dL (ref 8.9–10.3)
Chloride: 110 mmol/L (ref 98–111)
Creatinine, Ser: 1.11 mg/dL (ref 0.61–1.24)
GFR, Estimated: >60 mL/min (ref >60)
Glucose, Bld: 96 mg/dL (ref 70–99)
Potassium: 3.7 mmol/L (ref 3.5–5.1)
Sodium: 141 mmol/L (ref 135–145)

## 2020-12-13 NOTE — Evaluation (Signed)
Speech Language Pathology Evaluation Patient Details Name: Mike [name] MRN: [id number] DOB: 08/10/1961 Today's Date: 12/13/2020 Time: 4239-5320 SLP Time Calculation (min) (ACUTE ONLY): 15 min  Problem List:  Patient Active Problem List   Diagnosis Date Noted  . Cerebral thrombosis with cerebral infarction 12/11/2020  . Stroke (cerebrum) ([place]) 12/11/2020  . Pressure injury of skin 11/12/2020  . Acute urinary retention 11/09/2020  . Normal anion gap metabolic acidosis 23/34/3568  . Hypotension 11/07/2020  . Chest pain 10/31/2020  . Acute on chronic HFrEF (heart failure with reduced ejection fraction) ([place])   . Goals of care, counseling/discussion   . Palliative care by specialist   . DNR (do not resuscitate)   . Critical limb ischemia with history of revascularization of same extremity ([place]) 10/30/2020  . Hyperglycemia 10/30/2020  . Tobacco abuse 10/30/2020  . Leukocytosis 10/30/2020  . Chronic combined systolic and diastolic CHF (congestive heart failure) ([place]) 10/30/2020  . Ischemic cardiomyopathy 10/24/2020  . AKI (acute kidney injury) ([place]) 10/17/2020  . PAD (peripheral artery disease) ([place]) 10/16/2020  . Critical lower limb ischemia ([place]) 10/05/2020  . Rest pain of left upper extremity due to atherosclerosis ([place]) 10/05/2020  . Severe claudication ([place]) 09/20/2020  . Paresthesia of both lower extremities 09/20/2020  . H/O cardiac arrest 09/20/2020  . H/O acute myocardial infarction 08/14/2020  . Snoring 08/14/2020  . Sinus pause 08/14/2020  . Mixed hyperlipidemia 08/14/2020  . Tobacco dependence 08/14/2020  . Coronary artery disease involving native coronary artery of native heart without angina pectoris 08/10/2020  . HFrEF (heart failure with reduced ejection fraction) ([place]) 08/10/2020  . Moderate mitral regurgitation 08/10/2020  . STEMI (ST elevation myocardial infarction) ([place]) 08/02/2020  . Accelerated hypertension 08/01/2020   Past Medical History:  Past  Medical History:  Diagnosis Date  . CAD (coronary artery disease)   . Heart attack ([place])   . Hyperlipidemia   . Hypertension   . PAD (peripheral artery disease) ([place])    Past Surgical History:  Past Surgical History:  Procedure Laterality Date  . ABDOMINAL AORTOGRAM W/LOWER EXTREMITY N/A 10/16/2020   Procedure: ABDOMINAL AORTOGRAM W/LOWER EXTREMITY;  Surgeon: Nigel Mormon, MD;  Location: [facility];  Service: Cardiovascular;  Laterality: N/A;  . AMPUTATION Left 11/06/2020   Procedure: AMPUTATION ABOVE KNEE;  Surgeon: Rosetta Posner, MD;  Location: [facility];  Service: Vascular;  Laterality: Left;  . CORONARY STENT INTERVENTION N/A 08/03/2020   Procedure: CORONARY STENT INTERVENTION;  Surgeon: Nigel Mormon, MD;  Location: [place] [facility];  Service: Cardiovascular;  Laterality: N/A;  . CORONARY/GRAFT ACUTE MI REVASCULARIZATION N/A 08/01/2020   Procedure: Coronary/Graft Acute MI Revascularization;  Surgeon: Nigel Mormon, MD;  Location: [place];  Service: Cardiovascular;  Laterality: N/A;  . INTRAVASCULAR LITHOTRIPSY Right 10/16/2020   Procedure: INTRAVASCULAR LITHOTRIPSY;  Surgeon: Nigel Mormon, MD;  Location: [place] [facility];  Service: Cardiovascular;  Laterality: Right;  Common and External Iliac  . LEFT HEART CATH AND CORONARY ANGIOGRAPHY N/A 08/01/2020   Procedure: LEFT HEART CATH AND CORONARY ANGIOGRAPHY;  Surgeon: Nigel Mormon, MD;  Location: [facility];  Service: Cardiovascular;  Laterality: N/A;  . LEFT HEART CATH AND CORONARY ANGIOGRAPHY N/A 08/03/2020   Procedure: LEFT HEART CATH AND CORONARY ANGIOGRAPHY;  Surgeon: Nigel Mormon, MD;  Location: [facility];  Service: Cardiovascular;  Laterality: N/A;  . PERIPHERAL VASCULAR INTERVENTION Right 10/16/2020   Procedure: PERIPHERAL VASCULAR INTERVENTION;  Surgeon: Nigel Mormon, MD;  Location: [facility];  Service: Cardiovascular;  Laterality: Right;   Common and Iliac Stent   HPI:  Pt is a 59 y.o. 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 Donnetta Hutching on ASA and Plavix, HTN, HLD, CAD s/p STEMI with stenting 08/18/19, CHF, and tobacco abuse. He presented as a code stroke from [place] left facial droop and slurred speech noticed by PT. TPA was given. CT head: Hypodensity left anterior frontal lobe compatible with infarct in the ACA territory. This is likely chronic but could be subacute. No acute hemorrhage.  Pt is for TEE 12/17 and SLP speech eval ordered.   Assessment / Plan / Recommendation Clinical Impression  SLUMS administered to pt with him scoring 100%.  No focal CN deficits noted currently, pt states symptoms abated with TPA administration.  Pt's language and speech are fluent without difficulties. No SLP intervention warranted. Pt informed and agreeable.    SLP Assessment  SLP Recommendation/Assessment: Patient does not need any further Speech [facility] SLP Visit Diagnosis: Cognitive communication deficit (R41.841)    Follow Up Recommendations  None    Frequency and Duration    n/a     SLP Evaluation Cognition  Overall Cognitive Status: Within Functional Limits for tasks assessed Arousal/Alertness: Awake/alert Orientation Level: Oriented X4 Attention: Alternating Alternating Attention: Appears intact Memory: Appears intact (recalled 5/5 words independently) Awareness: Appears intact Problem Solving: Appears intact Safety/Judgment: Appears intact       Comprehension  Auditory Comprehension Overall Auditory Comprehension: Appears within functional limits for tasks assessed Yes/No Questions: Not tested Commands: Within Functional Limits Conversation: Complex Visual Recognition/Discrimination Discrimination: Within Function Limits Reading Comprehension Reading Status: Not tested    Expression Expression Primary Mode of Expression: Verbal Verbal Expression Overall  Verbal Expression: Appears within functional limits for tasks assessed Initiation: No impairment Repetition:  (dnt) Naming: Not tested Pragmatics: No impairment Written Expression Dominant Hand: Left Written Expression: Not tested   Oral / Motor  Oral Motor/Sensory Function Overall Oral Motor/Sensory Function: Within functional limits Motor Speech Overall Motor Speech: Appears within functional limits for tasks assessed Respiration: Within functional limits Phonation: Normal Resonance: Within functional limits Articulation: Within functional limitis Intelligibility: Intelligible Motor Planning: Witnin functional limits   GO                    Macario Golds 12/13/2020, 9:29 AM  Kathleen Lime, MS [facility] SLP Acute Rehab Services Office [phone] Pager [phone]

## 2020-12-13 NOTE — Plan of Care (Signed)
  Problem: Education: Goal: Knowledge of General Education information will improve Description: Including pain rating scale, medication(s)/side effects and non-pharmacologic comfort measures Outcome: Progressing   Problem: Health Behavior/Discharge Planning: Goal: Ability to manage health-related needs will improve Outcome: Progressing   Problem: Clinical Measurements: Goal: Ability to maintain clinical measurements within normal limits will improve Outcome: Progressing Goal: Will remain free from infection Outcome: Progressing Goal: Diagnostic test results will improve Outcome: Progressing Goal: Respiratory complications will improve Outcome: Progressing Goal: Cardiovascular complication will be avoided Outcome: Progressing   Problem: Activity: Goal: Risk for activity intolerance will decrease Outcome: Progressing   Problem: Nutrition: Goal: Adequate nutrition will be maintained Outcome: Progressing   Problem: Coping: Goal: Level of anxiety will decrease Outcome: Progressing   Problem: Elimination: Goal: Will not experience complications related to bowel motility Outcome: Progressing Goal: Will not experience complications related to urinary retention Outcome: Progressing   Problem: Pain Managment: Goal: General experience of comfort will improve Outcome: Progressing   Problem: Safety: Goal: Ability to remain free from injury will improve Outcome: Progressing   Problem: Education: Goal: Knowledge of disease or condition will improve Outcome: Progressing Goal: Knowledge of secondary prevention will improve Outcome: Progressing Goal: Knowledge of patient specific risk factors addressed and post discharge goals established will improve Outcome: Progressing Goal: Individualized Educational Video(s) Outcome: Progressing   Problem: Coping: Goal: Will verbalize positive feelings about self Outcome: Progressing Goal: Will identify appropriate support  needs Outcome: Progressing   Problem: Health Behavior/Discharge Planning: Goal: Ability to manage health-related needs will improve Outcome: Progressing   Problem: Nutrition: Goal: Risk of aspiration will decrease Outcome: Progressing Goal: Dietary intake will improve Outcome: Progressing   Problem: Ischemic Stroke/TIA Tissue Perfusion: Goal: Complications of ischemic stroke/TIA will be minimized Outcome: Progressing

## 2020-12-13 NOTE — Progress Notes (Signed)
Cardiologist on call paged that patient HR dropped into 30's alerted by CMT twice. No new orders at this time will continue to monitor. Arthor Captain LPN

## 2020-12-13 NOTE — Evaluation (Signed)
Occupational Therapy Evaluation Patient Details Name: Mike [name] MRN: [id number] DOB: 1961-09-26 Today's Date: 12/13/2020    History of Present Illness Pt is a 59 year old male presenting from rehab at [facility] with slurred speech, L facial droop, L sided weakness/neglect, and L sensory deficits s/p tPA. CT (+) subacute to chronic L frontal ACA CVA. No acute findings. PMHx significant for PAD, gangrene s/p L AKA 11/06/20, HTN, CAD s/p STEMI with stenting 08/18/19, and CHF.   Clinical Impression   Patient from short-term rehab at [facility] where he was nearing anticipated d/c home after previous L AKA on 11/06/20. Prior to AKA, patient was living alone and was independent with ADLs/IADLs. Patient currently functioning at supervision to [facility] guard level for ADL transfers, ADLs, and functional mobility with use of RW demonstrating good safety awareness. Patient presents with continued mild balance deficits indicating need for acute OT services to maximize safety and independence in prep for safe d/c home with recommendation for HHOT and 24hr supervision/assist from family. Patient states that his sister lives nearby and is semi-retired with ability to provide necessary level of assistance.     Follow Up Recommendations  Home health OT;Supervision/Assistance - 24 hour    Equipment Recommendations  Tub/shower bench;3 in 1 bedside commode;Wheelchair (measurements OT);Wheelchair cushion (measurements OT)    Recommendations for Other Services       Precautions / Restrictions Precautions Precautions: Fall Precaution Comments: L AKA November 2021; HTN Restrictions Weight Bearing Restrictions: No      Mobility Bed Mobility Overal bed mobility: Modified Independent                  Transfers Overall transfer level: Needs assistance Equipment used: Rolling walker (2 wheeled) Transfers: Sit to/from Stand Sit to Stand: Supervision         General transfer  comment: Demonstrates good safety awareness.    Balance Overall balance assessment: Needs assistance Sitting-balance support: Feet supported Sitting balance-Leahy Scale: Good Sitting balance - Comments: Able to doff/don R footwear seated EOB without LOB.   Standing balance support: Bilateral upper extremity supported;During functional activity Standing balance-Leahy Scale: Fair Standing balance comment: Able to manage clothing in standing with alternating UE support on RW.                           ADL either performed or assessed with clinical judgement   ADL                                               Vision Baseline Vision/History: Wears glasses Wears Glasses: Reading only Patient Visual Report: No change from baseline Vision Assessment?: No apparent visual deficits     Perception     Praxis      Pertinent Vitals/Pain Pain Assessment: No/denies pain     Hand Dominance Left   Extremity/Trunk Assessment Upper Extremity Assessment Upper Extremity Assessment: Overall WFL for tasks assessed   Lower Extremity Assessment Lower Extremity Assessment: Defer to PT evaluation   Cervical / Trunk Assessment Cervical / Trunk Assessment: Normal   Communication Communication Communication: No difficulties   Cognition Arousal/Alertness: Awake/alert Behavior During Therapy: WFL for tasks assessed/performed Overall Cognitive Status: Within Functional Limits for tasks assessed  General Comments       Exercises     Shoulder Instructions      Home Living Family/patient expects to be discharged to:: Private residence Living Arrangements: Alone Available Help at Discharge: Family;Available PRN/intermittently Type of Home: Apartment Home Access: Level entry;Ramped entrance     Home Layout: One level     Bathroom Shower/Tub: Teacher, early years/pre: Standard Bathroom  Accessibility: No (maybe walker could fit)   Home Equipment: Hand held shower head   Additional Comments: As far as he is aware, no further equipment has been ordered other than a ramp and hand-held shower head being installed. He has recently been at [facility] for rehab but plans to eventually return to home.  Lives With: Alone    Prior Functioning/Environment Level of Independence: Independent        Comments: This is based on prior to AKA. With PT at facility he has ambulated up to ~175 ft with a RW and min guard assist from therapists. Has required min guard-supervision and intermittent independence for getting into/out of bed.        OT Problem List: Impaired balance (sitting and/or standing)      OT Treatment/Interventions: Self-care/ADL training;Therapeutic exercise;Energy conservation;DME and/or AE instruction;Therapeutic activities;Patient/family education;Balance training    OT Goals(Current goals can be found in the care plan section) Acute Rehab OT Goals Patient Stated Goal: to go home OT Goal Formulation: With patient Time For Goal Achievement: 12/27/20 Potential to Achieve Goals: Good ADL Goals Pt Will Transfer to Toilet: with modified independence;ambulating Pt Will Perform Toileting - Clothing Manipulation and hygiene: with modified independence;sitting/lateral leans;sit to/from stand Pt Will Perform Tub/Shower Transfer: Tub transfer;with supervision;rolling walker  OT Frequency: Min 2X/week   Barriers to D/C:            Co-evaluation              AM-PAC OT "6 Clicks" Daily Activity     Outcome Measure Help from another person eating meals?: None Help from another person taking care of personal grooming?: A Little Help from another person toileting, which includes using toliet, bedpan, or urinal?: A Little Help from another person bathing (including washing, rinsing, drying)?: A Little Help from another person to put on and taking off regular  upper body clothing?: None Help from another person to put on and taking off regular lower body clothing?: A Little 6 Click Score: 20   End of Session Equipment Utilized During Treatment: Gait belt;Rolling walker  Activity Tolerance: Patient tolerated treatment well Patient left: in chair;with call bell/phone within reach;with chair alarm set  OT Visit Diagnosis: Unsteadiness on feet (R26.81)                Time: 8315-1761 OT Time Calculation (min): 25 min Charges:  OT General Charges $OT Visit: 1 Visit OT Evaluation $OT Eval Moderate Complexity: 1 Mod OT Treatments $Self Care/Home Management : 8-22 mins  Rahn Lacuesta H. OTR/L Supplemental OT, Department of rehab services [phone]  Patrizia Paule R H. 12/13/2020, 10:39 AM

## 2020-12-13 NOTE — Progress Notes (Signed)
STROKE TEAM PROGRESS NOTE   INTERVAL HISTORY no family at bedside, patient sitting in chair, no complaints.  Pending TEE tomorrow.  Currently on aspirin Plavix and low-dose Xarelto.  Tolerating well.  OBJECTIVE Vitals:   12/12/20 2001 12/12/20 2355 12/13/20 0404 12/13/20 0742  BP: 133/74 110/75 (!) 109/45 102/64  Pulse: 66 72 63 63  Resp: 17 18 20 18   Temp: 98.2 F (36.8 C) 98 F (36.7 C) 98 F (36.7 C) 98.3 F (36.8 C)  TempSrc: Oral Oral Oral Oral  SpO2: 99% 97% 99% 98%  Weight:      Height:       CBC:  Recent Labs  Lab 12/11/20 1020 12/11/20 1027 12/12/20 0130  WBC 7.7  --  8.2  NEUTROABS 5.1  --   --   HGB 12.0* 12.6* 11.0*  HCT 40.3 37.0* 36.3*  MCV 94.6  --  93.3  PLT 221  --  353   Basic Metabolic Panel:  Recent Labs  Lab 12/12/20 0130 12/13/20 0129  NA 143 141  K 3.7 3.7  CL 110 110  CO2 24 21*  GLUCOSE 105* 96  BUN 13 11  CREATININE 0.98 1.11  CALCIUM 8.6* 8.9   Lipid Panel:     Component Value Date/Time   CHOL 130 12/12/2020 0130   CHOL 95 (L) 09/11/2020 0806   TRIG 71 12/12/2020 0130   HDL 42 12/12/2020 0130   HDL 30 (L) 09/11/2020 0806   CHOLHDL 3.1 12/12/2020 0130   VLDL 14 12/12/2020 0130   LDLCALC 74 12/12/2020 0130   LDLCALC 49 09/11/2020 0806   HgbA1c:  Lab Results  Component Value Date   HGBA1C 4.7 (L) 12/12/2020   Urine Drug Screen:     Component Value Date/Time   LABOPIA NONE DETECTED 12/11/2020 1143   COCAINSCRNUR NONE DETECTED 12/11/2020 1143   LABBENZ NONE DETECTED 12/11/2020 1143   AMPHETMU NONE DETECTED 12/11/2020 1143   THCU NONE DETECTED 12/11/2020 1143   LABBARB NONE DETECTED 12/11/2020 1143    Alcohol Level     Component Value Date/Time   ETH <10 12/11/2020 1020    IMAGING  CT Code Stroke CTA Head W/WO contrast CT Code Stroke CTA Neck W/WO contrast 12/11/2020 IMPRESSION:  Atherosclerosis without large vessel occlusion. No hemodynamically significant stenosis at the ICA origins. Likely chronically  small caliber extracranial left vertebral artery with diminished opacification. Atherosclerotic irregularity of distal ACA and MCA branches including suspected high-grade stenosis of distal right M2 MCA branch. Intracranial left vertebral artery atherosclerosis with focal marked stenosis proximally. Moderate stenosis of distal left P2 PCA.   CT HEAD CODE STROKE WO CONTRAST 12/11/2020 IMPRESSION:  1. Hypodensity left anterior frontal lobe compatible with infarct in the ACA territory. This is likely chronic but could be subacute. No acute hemorrhage.  2. ASPECTS is 10    MR BRAIN WO CONTRAST 12/12/2020 at 1156 IMPRESSION:  Acute right MCA territory cortical infarcts within the right frontoparietal operculum and more posteriorly and superiorly within the right parietal lobe. Acute right PCA territory cortical infarcts within the right parietooccipital lobes. Additional 12 mm acute infarct within the right callosal splenium. Late subacute or chronic cortical infarct within the paramedian left frontal lobe (ACA vascular territory). Late subacute lacunar infarct within the mid right frontal lobe white matter. Background moderate cerebral white matter chronic small vessel ischemic disease. Small chronic left thalamic lacunar infarct. Mild cerebral atrophy.   ECHOCARDIOGRAM COMPLETE 12/11/2020 IMPRESSIONS   1. Left ventricular ejection fraction, by estimation, is  30 to 35%. The left ventricle has moderately decreased function. There is severe hypokinesis of the entire inferior and inferolateral walls. The rest of the LV segments are moderately hypokinetic.  The left ventricular internal cavity size was mildly dilated. There is mild concentric left ventricular hypertrophy. Left ventricular diastolic parameters are consistent with Grade II diastolic dysfunction (pseudonormalization).   2. Right ventricular systolic function is normal. The right ventricular size is normal.   3. The mitral valve is normal in  structure. Mild mitral valve regurgitation.   4. The aortic valve is tricuspid. There is mild calcification of the aortic valve. There is mild thickening of the aortic valve. Aortic valve regurgitation is not visualized.   5. The inferior vena cava is normal in size with greater than 50% respiratory variability, suggesting right atrial pressure of 3 mmHg. Comparison(s): Compared to prior echo on 08/02/2020, there is no significant change. Conclusion(s)/Recommendation(s): No intracardiac source of embolism detected on this transthoracic study. A transesophageal echocardiogram is recommended to exclude cardiac source of embolism if clinically indicated.   ECG - SR rate 86 BPM. (See cardiology reading for complete details)  PHYSICAL EXAM    Temp:  [97.7 F (36.5 C)-98.3 F (36.8 C)] 98.3 F (36.8 C) (12/16 0742) Pulse Rate:  [63-72] 63 (12/16 0742) Resp:  [16-20] 18 (12/16 0742) BP: (102-140)/(45-91) 102/64 (12/16 0742) SpO2:  [97 %-100 %] 98 % (12/16 0742)  General - Well nourished, well developed, in no apparent distress.  Ophthalmologic - fundi not visualized due to noncooperation.  Cardiovascular - Regular rhythm and rate.  Mental Status -  Level of arousal and orientation to time, place, and person were intact. Language including expression, naming, repetition, comprehension was assessed and found intact. Attention span and concentration were normal. Fund of Knowledge was assessed and was intact.  Cranial Nerves II - XII - II - Visual field intact OU. III, IV, VI - Extraocular movements intact. V - Facial sensation intact bilaterally. VII - Facial movement intact bilaterally. VIII - Hearing & vestibular intact bilaterally. X - Palate elevates symmetrically. XI - Chin turning & shoulder shrug intact bilaterally. XII - Tongue protrusion intact.  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  Bulk was normal  and fasciculations were absent.   Motor Tone - Muscle tone was assessed at the neck and appendages and was normal.  Reflexes - The patient's reflexes were symmetrical in all extremities and he had no pathological reflexes.  Sensory - Light touch, temperature/pinprick were assessed and were symmetrical.    Coordination - The patient had normal movements in the hands with no ataxia or dysmetria.  Tremor was absent.  Gait and Station - deferred.   ASSESSMENT/PLAN [name] is a 59 y.o. male with history of PAD/gangrene s/p left AKA 11/06/20 by Dr. Donnetta Hutching on ASA and Plavix, HTN, HLD, CAD s/p STEMI with stenting 08/18/19, CHF, and tobacco abuse presenting with LUE weakness and numbness, slurred speech and facial droop. The patient received IV t-PA Tues 12/14 at 1100.  Stroke:  Multifocal small patchy Rt MCA and PCA acute and left ACA subacute/chronic infarcts - embolic - concerning for cardiac source. However, pt does have multifocal intracranial stenosis.  CT Head - Hypodensity left anterior frontal lobe compatible with infarct in the ACA territory. This is likely chronic but could be subacute. No acute hemorrhage. ASPECTS is 10    CTA H&N - Atherosclerotic irregularity of distal  ACA and MCA branches including suspected high-grade stenosis of distal right M2 MCA branch. Intracranial left vertebral artery atherosclerosis with focal marked stenosis proximally. Moderate stenosis of distal left P2 PCA.   MRI head - Acute right MCA territory cortical infarcts within the right frontoparietal operculum and more posteriorly and superiorly within the right parietal lobe. Acute right PCA territory cortical infarcts within the right parietooccipital lobes. Additional 12 mm acute infarct within the right callosal splenium. Late subacute or chronic cortical infarct within the paramedian left frontal lobe (ACA vascular territory). Late subacute lacunar infarct within the mid right frontal lobe white  matter. Small chronic left thalamic lacunar infarct.  2D Echo - EF 30 - 35%. No cardiac source of emboli identified.   TEE 12/17 pending  Lacey Jensen Virus 2 - negative  LDL - 74  HgbA1c - 4.7  UDS - negative  VTE prophylaxis - SCDs  aspirin 81 mg daily and clopidogrel 75 mg daily prior to admission, now on  ASA 81 mg and plavix DAPT and Xarelto 2.5mg  bid   Therapy recommendations:  HH OT, HH PT, RW w/ 5" wheels, WC, WC cushion  Disposition:  Pending  PAD S/p left AKA  Followed with vascular surgery  Left AKA surgery 10/2020  In rehab PTA for SNF post surgery  CAD/MI CHF  07/2020 STEMI  S/p stenting  On DAPT  EF 30-35% in 07/2020  Follows with Dr. Celso Sickle as outpt  Hypertension  Home BP meds: lasix  Current BP meds: none   Stable . Long-term BP goal normotensive  Hyperlipidemia  Home Lipid lowering medication: Crestor 10 mg daaily  LDL 74, goal < 70  Current on Crestor to 20 mg daily  Continue statin at discharge  Tobacco abuse  smoker before AKA  Smoking cessation counseling again provided  Pt is willing to quit  Other Stroke Risk Factors  Hx stroke/TIA by imaging  Other Active Problems   Code status - DNR   Urinary retenion, on flomax  Hospital day # 2  Rosalin Hawking, MD PhD Stroke Neurology 12/13/2020 9:07 AM  To contact Stroke Continuity provider, please refer to [email or web address] After hours, contact General Neurology

## 2020-12-13 NOTE — Progress Notes (Cosign Needed)
Wheelchair:   Patient suffers from [place] which impairs their ability to perform daily activities like walking in the home. A walker will not resolve  issue with performing activities of daily living. A wheelchair will allow patient to safely perform daily activities. Patient is not able to propel themselves in the home using a standard weight wheelchair due to weakness. Patient can self propel in the lightweight wheelchair. Length of need lifetime.  Accessories: elevating leg rests (ELRs), wheel locks, extensions and anti-tippers.

## 2020-12-13 NOTE — Progress Notes (Signed)
CMT called heart rate dropped to 30's patient was found asleep after arousing him awake HR was 67. Will continue to monitor. Arthor Captain LPN

## 2020-12-13 NOTE — Anesthesia Preprocedure Evaluation (Addendum)
Anesthesia Evaluation  Patient identified by MRN, date of birth, ID band Patient awake    Reviewed: Allergy & Precautions, NPO status , Patient's Chart, lab work & pertinent test results  History of Anesthesia Complications Negative for: history of anesthetic complications  Airway Mallampati: II  TM Distance: >3 FB Neck ROM: Full    Dental  (+) Dental Advisory Given   Pulmonary Current Smoker and Patient abstained from smoking.,    Pulmonary exam normal        Cardiovascular hypertension, + CAD, + Past MI, + Cardiac Stents, + Peripheral Vascular Disease and +CHF  Normal cardiovascular exam   '21 TTE - EF 30 to 35%. Severe hypokinesis of the entire inferior and inferolateral walls. The rest of the LV segments are moderately hypokinetic. LV was mildly dilated. Mild concentric left ventricular hypertrophy. Left ventricular diastolic parameters are consistent with Grade II diastolic dysfunction (pseudonormalization). Mild MR.    Neuro/Psych CVA, No Residual Symptoms negative psych ROS   GI/Hepatic negative GI ROS, (+)     substance abuse  alcohol use,   Endo/Other  negative endocrine ROS  Renal/GU      Musculoskeletal negative musculoskeletal ROS (+)   Abdominal   Peds  Hematology  (+) anemia ,  On plavix    Anesthesia Other Findings Covid test negative   Reproductive/Obstetrics                           Anesthesia Physical Anesthesia Plan  ASA: IV  Anesthesia Plan: MAC   Post-op Pain Management:    Induction: Intravenous  PONV Risk Score and Plan: 0 and Propofol infusion and Treatment may vary due to age or medical condition  Airway Management Planned: Nasal Cannula and Natural Airway  Additional Equipment: None  Intra-op Plan:   Post-operative Plan:   Informed Consent: I have reviewed the patients History and Physical, chart, labs and discussed the procedure including the  risks, benefits and alternatives for the proposed anesthesia with the patient or authorized representative who has indicated his/her understanding and acceptance.   Patient has DNR.  Discussed DNR with patient and Suspend DNR.     Plan Discussed with: CRNA and Anesthesiologist  Anesthesia Plan Comments:        Anesthesia Quick Evaluation

## 2020-12-14 ENCOUNTER — Inpatient Hospital Stay (HOSPITAL_COMMUNITY): Payer: BC Managed Care – PPO | Admitting: Certified Registered"

## 2020-12-14 ENCOUNTER — Inpatient Hospital Stay (HOSPITAL_COMMUNITY): Payer: BC Managed Care – PPO

## 2020-12-14 ENCOUNTER — Encounter (HOSPITAL_COMMUNITY)
Admission: EM | Disposition: A | Discharge status: Home Health | Payer: Self-pay | Source: Home / Self Care | Attending: Neurology

## 2020-12-14 ENCOUNTER — Other Ambulatory Visit: Payer: Self-pay | Admitting: Cardiology

## 2020-12-14 ENCOUNTER — Encounter (HOSPITAL_COMMUNITY): Payer: Self-pay | Admitting: Neurology

## 2020-12-14 ENCOUNTER — Inpatient Hospital Stay (HOSPITAL_COMMUNITY): Admission: RE | Admit: 2020-12-14 | Payer: BC Managed Care – PPO | Source: Home / Self Care | Admitting: Cardiology

## 2020-12-14 DIAGNOSIS — Q2112 Patent foramen ovale: Secondary | ICD-10-CM

## 2020-12-14 DIAGNOSIS — I280 Arteriovenous fistula of pulmonary vessels: Secondary | ICD-10-CM

## 2020-12-14 DIAGNOSIS — Q211 Atrial septal defect: Secondary | ICD-10-CM

## 2020-12-14 DIAGNOSIS — I6340 Cerebral infarction due to embolism of unspecified cerebral artery: Secondary | ICD-10-CM

## 2020-12-14 HISTORY — DX: Patent foramen ovale: Q21.12

## 2020-12-14 HISTORY — DX: Atrial septal defect: Q21.1

## 2020-12-14 HISTORY — PX: BUBBLE STUDY: SHX6837

## 2020-12-14 HISTORY — DX: Arteriovenous fistula of pulmonary vessels: I28.0

## 2020-12-14 HISTORY — PX: TEE WITHOUT CARDIOVERSION: SHX5443

## 2020-12-14 LAB — BASIC METABOLIC PANEL
Anion gap: 8 (ref 5–15)
BUN: 13 mg/dL (ref 8–23)
CO2: 22 mmol/L (ref 22–32)
Calcium: 8.6 mg/dL — ABNORMAL LOW (ref 8.9–10.3)
Chloride: 110 mmol/L (ref 98–111)
Creatinine, Ser: 1.09 mg/dL (ref 0.61–1.24)
GFR, Estimated: >60 mL/min (ref >60)
Glucose, Bld: 100 mg/dL — ABNORMAL HIGH (ref 70–99)
Potassium: 3.9 mmol/L (ref 3.5–5.1)
Sodium: 140 mmol/L (ref 135–145)

## 2020-12-14 LAB — ECHOCARDIOGRAM LIMITED
Calc EF: 37.8 %
Height: 72 in
Single Plane A2C EF: 29.8 %
Single Plane A4C EF: 43.7 %
Weight: 2240 oz

## 2020-12-14 LAB — CBC
HCT: 35.5 % — ABNORMAL LOW (ref 39.0–52.0)
Hemoglobin: 11.6 g/dL — ABNORMAL LOW (ref 13.0–17.0)
MCH: 29.6 pg (ref 26.0–34.0)
MCHC: 32.7 g/dL (ref 30.0–36.0)
MCV: 90.6 fL (ref 80.0–100.0)
Platelets: 202 10*3/uL (ref 150–400)
RBC: 3.92 MIL/uL — ABNORMAL LOW (ref 4.22–5.81)
RDW: 16.2 % — ABNORMAL HIGH (ref 11.5–15.5)
WBC: 7.5 10*3/uL (ref 4.0–10.5)
nRBC: 0 % (ref 0.0–0.2)

## 2020-12-14 LAB — ECHO TEE
MV M vel: 4.84 m/s
MV Peak grad: 93.7 mmHg
Radius: 0.4 cm

## 2020-12-14 SURGERY — ECHOCARDIOGRAM, TRANSESOPHAGEAL
Anesthesia: Monitor Anesthesia Care

## 2020-12-14 MED ORDER — PROPOFOL 10 MG/ML IV BOLUS
INTRAVENOUS | Status: DC | PRN
  Administered 2020-12-14: 30 mg via INTRAVENOUS

## 2020-12-14 MED ORDER — LIDOCAINE 2% (20 MG/ML) 5 ML SYRINGE
INTRAMUSCULAR | Status: DC | PRN
  Administered 2020-12-14: 60 mg via INTRAVENOUS

## 2020-12-14 MED ORDER — PROPOFOL 500 MG/50ML IV EMUL
INTRAVENOUS | Status: DC | PRN
  Administered 2020-12-14: 100 ug/kg/min via INTRAVENOUS

## 2020-12-14 MED ORDER — PHENYLEPHRINE HCL (PRESSORS) 10 MG/ML IV SOLN
INTRAVENOUS | Status: DC | PRN
  Administered 2020-12-14 (×3): 80 ug via INTRAVENOUS

## 2020-12-14 MED ORDER — PERFLUTREN LIPID MICROSPHERE
1.0000 mL | INTRAVENOUS | Status: AC | PRN
  Administered 2020-12-14: 2 mL via INTRAVENOUS
  Filled 2020-12-14: qty 10

## 2020-12-14 MED ORDER — IOHEXOL 350 MG/ML SOLN
75.0000 mL | Freq: Once | INTRAVENOUS | Status: AC | PRN
  Administered 2020-12-14: 75 mL via INTRAVENOUS

## 2020-12-14 MED ORDER — SODIUM CHLORIDE 0.9 % IV SOLN: INTRAVENOUS | Status: AC

## 2020-12-14 MED ORDER — SODIUM CHLORIDE 0.9 % IV SOLN: INTRAVENOUS | Status: DC

## 2020-12-14 NOTE — Anesthesia Postprocedure Evaluation (Signed)
Anesthesia Post Note  Patient: Mike [name]  Procedure(s) Performed: TRANSESOPHAGEAL ECHOCARDIOGRAM (TEE) (N/A ) BUBBLE STUDY     Patient location during evaluation: PACU Anesthesia Type: MAC Level of consciousness: awake and alert Pain management: pain level controlled Vital Signs Assessment: post-procedure vital signs reviewed and stable Respiratory status: spontaneous breathing, nonlabored ventilation and respiratory function stable Cardiovascular status: stable and blood pressure returned to baseline Anesthetic complications: no   No complications documented.  Last Vitals:  Vitals:   12/14/20 0830 12/14/20 0840  BP: 108/67 119/66  Pulse: 70 (!) 58  Resp: 14 12  Temp:    SpO2: 97% 98%    Last Pain:  Vitals:   12/14/20 0840  TempSrc:   PainSc: 0-No pain                 Audry Pili

## 2020-12-14 NOTE — Anesthesia Procedure Notes (Signed)
Procedure Name: MAC Date/Time: 12/14/2020 7:42 AM Performed by: Amadeo Garnet, CRNA Pre-anesthesia Checklist: Patient identified, Emergency Drugs available, Suction available and Patient being monitored Patient Re-evaluated:Patient Re-evaluated prior to induction Oxygen Delivery Method: Nasal cannula Preoxygenation: Pre-oxygenation with 100% oxygen Induction Type: IV induction Placement Confirmation: positive ETCO2 Dental Injury: Teeth and Oropharynx as per pre-operative assessment

## 2020-12-14 NOTE — Interval H&P Note (Signed)
History and Physical Interval Note:  12/14/2020 7:36 AM  Mike [name]  has presented today for surgery, with the diagnosis of stroke.  The various methods of treatment have been discussed with the patient and family. After consideration of risks, benefits and other options for treatment, the patient has consented to  Procedure(s): TRANSESOPHAGEAL ECHOCARDIOGRAM (TEE) (N/A) as a surgical intervention.  The patient's history has been reviewed, patient examined, no change in status, stable for surgery.  I have reviewed the patient's chart and labs.  Questions were answered to the patient's satisfaction.      [place]

## 2020-12-14 NOTE — Progress Notes (Signed)
  Echocardiogram 2D Echocardiogram with definity has been performed.  Mike [name] M 12/14/2020, 3:30 PM

## 2020-12-14 NOTE — Transfer of Care (Signed)
Immediate Anesthesia Transfer of Care Note  Patient: Mike [name]  Procedure(s) Performed: TRANSESOPHAGEAL ECHOCARDIOGRAM (TEE) (N/A ) BUBBLE STUDY  Patient Location: Endoscopy Unit  Anesthesia Type:MAC  Level of Consciousness: awake, alert  and oriented  Airway & Oxygen Therapy: Patient Spontanous Breathing  Post-op Assessment: Report given to RN, Post -op Vital signs reviewed and stable and Patient moving all extremities  Post vital signs: Reviewed and stable  Last Vitals:  Vitals Value Taken Time  BP 100/58 12/14/20 0820  Temp    Pulse 69 12/14/20 0820  Resp 18 12/14/20 0820  SpO2 98 % 12/14/20 0820  Vitals shown include unvalidated device data.  Last Pain:  Vitals:   12/14/20 0718  TempSrc: Tympanic  PainSc: 0-No pain         Complications: No complications documented.

## 2020-12-14 NOTE — Progress Notes (Signed)
Pt discharged from hospital to home. Sister picked up patient. Pt left with cell phone, belongings, and personal wheelchair. Discharge instructions/education completed and understood by patient.

## 2020-12-14 NOTE — Progress Notes (Signed)
Physical Therapy Treatment Patient Details Name: Mike [name] MRN: [id number] DOB: August 26, 1961 Today's Date: 12/14/2020    History of Present Illness Pt is a 59 year old male who presented from inpatient rehab at [facility] when he presented with a L side facial droop, L neglect, L downward hemianopia, L arm weakness and sensory loss, and slurred speech. NIHSS on arrival was 5. Administered tPA to pt at 10:51 on 12/11/20. CT showed subacute to chronic L frontal ACA territory infarct but no acute infarct. CTA with high grade stenosis of distal right M2 MCA and LVA atherosclerosis with stenosis proximally. Stenosis of distal left P2 PCA. Awaiting MRI of brain. Pt has medical hx of PAD/gangrene s/p L AKA 11/06/20, HTN, CAD s/p STEMI with stenting 08/18/19, and CHF.    PT Comments    Pt aware of his deficits and displays appropriate safe management of RW during functional mob. No LOB throughout session this date. Provided cues to improve R toe push-off and R knee natural flexion during swing with gait bout, with mod success in correcting. Pt continues to display reliance on UEs to support him with gait. Reviewed how to manage parts of w/c and HEP of 4-way L hip AROM for prep to d/c home. Will continue to follow acutely. Current recommendations remain appropriate.    Follow Up Recommendations  Home health PT;Supervision - Intermittent     Equipment Recommendations  Rolling walker with 5" wheels;3in1 (PT);Wheelchair (measurements PT);Wheelchair cushion (measurements PT)    Recommendations for Other Services       Precautions / Restrictions Precautions Precautions: Fall Precaution Comments: L AKA November 2021; HTN Restrictions Weight Bearing Restrictions: No    Mobility  Bed Mobility Overal bed mobility: Modified Independent             General bed mobility comments: Pt able to transition supine > sit L EOB safely with mod I with HOB elevated.  Transfers Overall transfer  level: Needs assistance Equipment used: Rolling walker (2 wheeled) Transfers: Sit to/from Stand Sit to Stand: Min guard         General transfer comment: Sit to stand transfer safely with proper hand placement, min guard for safety but no overt LOB or significant trunk sway.  Ambulation/Gait Ambulation/Gait assistance: Min guard Gait Distance (Feet): 190 Feet Assistive device: Rolling walker (2 wheeled) Gait Pattern/deviations: Step-through pattern;Trunk flexed (Swing-through) Gait velocity: reduced Gait velocity interpretation: 1.31 - 2.62 ft/sec, indicative of limited community ambulator General Gait Details: Ambulates with swing-through pattern on R leg, displaying dependence on UE support on RW for R swing. No overt LOB but mild unsteadiness noted, min guard for safety. Pt fatigues and slows with increased distance. Appropriate management of RW during gait and turns. Cues to push off R toes and relax leg for more natureal R knee flexion in swing.   Stairs             Wheelchair Mobility    Modified Rankin (Stroke Patients Only) Modified Rankin (Stroke Patients Only) Pre-Morbid Rankin Score: Moderately severe disability Modified Rankin: Moderately severe disability     Balance Overall balance assessment: Mild deficits observed, not formally tested                                          Cognition Arousal/Alertness: Awake/alert Behavior During Therapy: WFL for tasks assessed/performed Overall Cognitive Status: Within Functional Limits for tasks assessed  Exercises      General Comments General comments (skin integrity, edema, etc.): Reviewed how to change parts on w/c and benefits of AD/AE recommended by PT; Reviewed HEP of 4-way hip AROM with L limb      Pertinent Vitals/Pain Pain Assessment: No/denies pain    Home Living                      Prior Function             PT Goals (current goals can now be found in the care plan section) Acute Rehab PT Goals Patient Stated Goal: to go home PT Goal Formulation: With patient Time For Goal Achievement: 12/26/20 Potential to Achieve Goals: Good Progress towards PT goals: Progressing toward goals    Frequency    Min 3X/week      PT Plan Frequency needs to be updated;Current plan remains appropriate    Co-evaluation              AM-PAC PT "6 Clicks" Mobility   Outcome Measure  Help needed turning from your back to your side while in a flat bed without using bedrails?: None Help needed moving from lying on your back to sitting on the side of a flat bed without using bedrails?: None Help needed moving to and from a bed to a chair (including a wheelchair)?: A Little Help needed standing up from a chair using your arms (e.g., wheelchair or bedside chair)?: A Little Help needed to walk in hospital room?: A Little Help needed climbing 3-5 steps with a railing? : A Lot 6 Click Score: 19    End of Session Equipment Utilized During Treatment: Gait belt Activity Tolerance: Patient tolerated treatment well Patient left: in chair;with call bell/phone within reach;with chair alarm set Nurse Communication: Mobility status PT Visit Diagnosis: Unsteadiness on feet (R26.81);Other abnormalities of gait and mobility (R26.89);Difficulty in walking, not elsewhere classified (R26.2);Muscle weakness (generalized) (M62.81)     Time: 9798-9211 PT Time Calculation (min) (ACUTE ONLY): 21 min  Charges:  $Gait Training: 8-22 mins                     Moishe Spice, PT, DPT Acute Rehabilitation Services  Pager: [phone] Office: [place] 12/14/2020, 2:27 PM

## 2020-12-14 NOTE — TOC Transition Note (Signed)
Transition of Care [facility]) - CM/SW Discharge Note   Patient Details  Name: Mike [name] MRN: [id number] Date of Birth: 05-15-1961  [facility] CM/SW Contact:  Pollie Friar, RN Phone Number: 12/14/2020, 3:54 PM   Clinical Narrative:    Home health arranged through [facility]. They state their PT will do PT/OT therapies.  Wheelchair and walker delivered to the room per [facility].  Insurance wont cover elevated commode seat. Pt to purchase outside the hospital. Pt has transport home when medically ready.   Final next level of care: Home w Home Health Services Barriers to Discharge: No Barriers Identified   Patient Goals and CMS Choice   CMS Medicare.gov Compare Post Acute Care list provided to:: Patient Choice offered to / list presented to : Patient  Discharge Placement                       Discharge Plan and Services                DME Arranged: Gilford Rile rolling,Wheelchair manual DME Agency: [facility] Date DME Agency Contacted: 12/14/20   Representative spoke with at DME Agency: Freda Munro [place]: PT [place]: [facility] Date [place]: 12/14/20      Social Determinants of Health ([place]) Interventions     Readmission Risk Interventions No flowsheet data found.

## 2020-12-14 NOTE — CV Procedure (Addendum)
TEE: Under deep sedation, monitored by anesthesiology, TEE was performed. LV: EF 30-35%. ?echolucency near posteior papillary muscle. Will get limited echo w/contrast RV: Normal LA: Normal. Left atrial appendage: Normal without thrombus. Normal function. PFO with strongly positive bubble study. Also, cannot exclude pulmonary AV fistula. PFO itself seems to be small for the amount of bubbles contrast. RA: Normal  MV: Mod MR. TV: Normal Trace TR AV: Normal. No AI or AS. PV: Normal. Trace PI.  Thoracic and ascending aorta: Moderate plaque seen  Deep sedation protocol was followed, monitored be anesthesiology.  At the end of TEE, I noticed blood pooled under his tongue. In spite of having bite block in place, it appears that he as a small laceration on the underside of his tongue. DAPT and low dose Xarelto is probably exacerbating the bleeding. Recommend leaving gauze in place. If bleeding continues, could consider ENT/dentist consult. Will stop Xarelto for now.  Also, consider CTA contrast to evaluate for pulmonary AV fistula. Would hold Entresto, in that case.    Nigel Mormon, MD Pager: [phone] Office: [phone]

## 2020-12-14 NOTE — Discharge Summary (Signed)
Patient ID: Mike [name]   MRN: [id number]      DOB: [birth year]  Date of Admission: 12/11/2020 Date of Discharge: 12/14/2020  Attending Physician:  Rosalin Hawking, MD, Stroke MD Consultant(s):    Cardiology Consult - Patwardhan, Reynold Bowen, MD Patient's PCP:  Patient, No Pcp Per  DISCHARGE DIAGNOSIS:   Cerebral thrombosis with cerebral infarction - Multifocal small patchy Rt MCA and PCA acute and left ACA subacute/chronic infarcts  Left AKA Nov 2021  Cardiomyopathy with EF 30 - 35% ;   PFO  Pulmonary AV Fistula  Coronary Artery Disease  Tobacco habituation   Dyslipidemia  Hypertension  Urinary retention   Past Medical History:  Diagnosis Date  . CAD (coronary artery disease)   . Heart attack ([place])   . Hyperlipidemia   . Hypertension   . PAD (peripheral artery disease) ([place])    Past Surgical History:  Procedure Laterality Date  . ABDOMINAL AORTOGRAM W/LOWER EXTREMITY N/A 10/16/2020   Procedure: ABDOMINAL AORTOGRAM W/LOWER EXTREMITY;  Surgeon: Nigel Mormon, MD;  Location: [place];  Service: Cardiovascular;  Laterality: N/A;  . AMPUTATION Left 11/06/2020   Procedure: AMPUTATION ABOVE KNEE;  Surgeon: Rosetta Posner, MD;  Location: [place];  Service: Vascular;  Laterality: Left;  . CORONARY STENT INTERVENTION N/A 08/03/2020   Procedure: CORONARY STENT INTERVENTION;  Surgeon: Nigel Mormon, MD;  Location: [facility];  Service: Cardiovascular;  Laterality: N/A;  . CORONARY/GRAFT ACUTE MI REVASCULARIZATION N/A 08/01/2020   Procedure: Coronary/Graft Acute MI Revascularization;  Surgeon: Nigel Mormon, MD;  Location: [facility];  Service: Cardiovascular;  Laterality: N/A;  . INTRAVASCULAR LITHOTRIPSY Right 10/16/2020   Procedure: INTRAVASCULAR LITHOTRIPSY;  Surgeon: Nigel Mormon, MD;  Location: [place] [facility];  Service: Cardiovascular;  Laterality: Right;  Common and External Iliac  . LEFT HEART CATH AND CORONARY  ANGIOGRAPHY N/A 08/01/2020   Procedure: LEFT HEART CATH AND CORONARY ANGIOGRAPHY;  Surgeon: Nigel Mormon, MD;  Location: [facility];  Service: Cardiovascular;  Laterality: N/A;  . LEFT HEART CATH AND CORONARY ANGIOGRAPHY N/A 08/03/2020   Procedure: LEFT HEART CATH AND CORONARY ANGIOGRAPHY;  Surgeon: Nigel Mormon, MD;  Location: [facility];  Service: Cardiovascular;  Laterality: N/A;  . PERIPHERAL VASCULAR INTERVENTION Right 10/16/2020   Procedure: PERIPHERAL VASCULAR INTERVENTION;  Surgeon: Nigel Mormon, MD;  Location: [facility];  Service: Cardiovascular;  Laterality: Right;  Common and Iliac Stent    Family History Family History  Problem Relation Age of Onset  . Heart disease Mother   . Heart disease Father   . Cancer Brother     Social History  reports that he has been smoking cigarettes. He has a 20.00 pack-year smoking history. He has never used smokeless tobacco. He reports current alcohol use of about 10.0 standard drinks of alcohol per week. He reports that he does not use drugs.  Allergies as of 12/14/2020      Reactions   Chlorhexidine Other (See Comments)   Unknown reaction - Per MAR      Medication List    STOP taking these medications   nicotine 7 mg/24hr patch Commonly known as: NICODERM CQ - dosed in mg/24 hr     TAKE these medications   aspirin 81 MG EC tablet Take 1 tablet (81 mg total) by mouth daily. Swallow whole.   clopidogrel 75 MG tablet Commonly known as: PLAVIX Take 1 tablet (  75 mg total) by mouth daily.   furosemide 20 MG tablet Commonly known as: LASIX Take 20 mg by mouth daily.   gabapentin 300 MG capsule Commonly known as: NEURONTIN Take 1 capsule (300 mg total) by mouth 2 (two) times daily. What changed:   how much to take  when to take this  additional instructions   liver oil-zinc oxide 40 % ointment Commonly known as: DESITIN Apply 1 application topically daily as needed for  irritation.   Zinc Oxide 16 % Oint Apply 1 application topically 2 (two) times daily.   nitroGLYCERIN 0.4 MG SL tablet Commonly known as: NITROSTAT Place 1 tablet (0.4 mg total) under the tongue every 5 (five) minutes as needed for chest pain. What changed: reasons to take this   potassium chloride 10 MEQ tablet Commonly known as: KLOR-CON Take 10 mEq by mouth daily.   rosuvastatin 10 MG tablet Commonly known as: CRESTOR Take 1 tablet (10 mg total) by mouth daily.   tamsulosin 0.4 MG Caps capsule Commonly known as: FLOMAX Take 0.4 mg by mouth daily.   triamcinolone 0.1 % Commonly known as: KENALOG Apply 1 application topically 2 (two) times daily.            Durable Medical Equipment  (From admission, onward)         Start     Ordered   12/13/20 1601  For home use only DME Eelevated commode seat  Once        12/13/20 1600   12/13/20 1558  For home use only DME Walker rolling  Once       Question Answer Comment  Walker: With [place]   Patient needs a walker to treat with the following condition Stroke [facility])      12/13/20 1558   12/13/20 1557  For home use only DME lightweight manual wheelchair with seat cushion  Once       Comments: Patient suffers from [facility] which impairs their ability to perform daily activities like walking in the home.  A walker will not resolve  issue with performing activities of daily living. A wheelchair will allow patient to safely perform daily activities. Patient is not able to propel themselves in the home using a standard weight wheelchair due to weakness. Patient can self propel in the lightweight wheelchair. Length of need lifetime. Accessories: elevating leg rests (ELRs), wheel locks, extensions and anti-tippers.   12/13/20 1558          LABORATORY STUDIES CBC    Component Value Date/Time   WBC 7.5 12/14/2020 0450   RBC 3.92 (L) 12/14/2020 0450   HGB 11.6 (L) 12/14/2020 0450   HCT 35.5 (L) 12/14/2020 0450   PLT 202  12/14/2020 0450   MCV 90.6 12/14/2020 0450   MCV 85.0 03/16/2016 1642   MCH 29.6 12/14/2020 0450   MCHC 32.7 12/14/2020 0450   RDW 16.2 (H) 12/14/2020 0450   LYMPHSABS 1.4 12/11/2020 1020   MONOABS 0.5 12/11/2020 1020   EOSABS 0.6 (H) 12/11/2020 1020   BASOSABS 0.0 12/11/2020 1020   CMP    Component Value Date/Time   NA 140 12/14/2020 0450   NA 135 10/26/2020 1004   K 3.9 12/14/2020 0450   CL 110 12/14/2020 0450   CO2 22 12/14/2020 0450   GLUCOSE 100 (H) 12/14/2020 0450   BUN 13 12/14/2020 0450   BUN 79 (HH) 10/26/2020 1004   CREATININE 1.09 12/14/2020 0450   CALCIUM 8.6 (L) 12/14/2020 0450  PROT 6.5 12/11/2020 1020   PROT 7.0 09/25/2020 0903   ALBUMIN 3.0 (L) 12/11/2020 1020   ALBUMIN 4.3 09/25/2020 0903   AST 17 12/11/2020 1020   ALT 10 12/11/2020 1020   ALKPHOS 76 12/11/2020 1020   BILITOT 0.8 12/11/2020 1020   BILITOT 0.8 09/25/2020 0903   GFRNONAA >60 12/14/2020 0450   GFRAA 12 (L) 10/26/2020 1004   COAGS Lab Results  Component Value Date   INR 1.0 12/11/2020   INR 1.2 10/30/2020   Lipid Panel    Component Value Date/Time   CHOL 130 12/12/2020 0130   CHOL 95 (L) 09/11/2020 0806   TRIG 71 12/12/2020 0130   HDL 42 12/12/2020 0130   HDL 30 (L) 09/11/2020 0806   CHOLHDL 3.1 12/12/2020 0130   VLDL 14 12/12/2020 0130   LDLCALC 74 12/12/2020 0130   LDLCALC 49 09/11/2020 0806   HgbA1C  Lab Results  Component Value Date   HGBA1C 4.7 (L) 12/12/2020   Urinalysis    Component Value Date/Time   COLORURINE STRAW (A) 12/11/2020 1143   APPEARANCEUR CLEAR 12/11/2020 1143   LABSPEC 1.016 12/11/2020 1143   PHURINE 7.0 12/11/2020 1143   GLUCOSEU NEGATIVE 12/11/2020 1143   HGBUR NEGATIVE 12/11/2020 1143   BILIRUBINUR NEGATIVE 12/11/2020 1143   KETONESUR NEGATIVE 12/11/2020 1143   PROTEINUR NEGATIVE 12/11/2020 1143   NITRITE NEGATIVE 12/11/2020 1143   LEUKOCYTESUR NEGATIVE 12/11/2020 1143   Urine Drug Screen     Component Value Date/Time   LABOPIA NONE  DETECTED 12/11/2020 1143   COCAINSCRNUR NONE DETECTED 12/11/2020 1143   LABBENZ NONE DETECTED 12/11/2020 1143   AMPHETMU NONE DETECTED 12/11/2020 1143   THCU NONE DETECTED 12/11/2020 1143   LABBARB NONE DETECTED 12/11/2020 1143    Alcohol Level    Component Value Date/Time   ETH <10 12/11/2020 1020     SIGNIFICANT DIAGNOSTIC STUDIES  CT Code Stroke CTA Head W/WO contrast CT Code Stroke CTA Neck W/WO contrast 12/11/2020 IMPRESSION:  Atherosclerosis without large vessel occlusion. No hemodynamically significant stenosis at the ICA origins. Likely chronically small caliber extracranial left vertebral artery with diminished opacification. Atherosclerotic irregularity of distal ACA and MCA branches including suspected high-grade stenosis of distal right M2 MCA branch. Intracranial left vertebral artery atherosclerosis with focal marked stenosis proximally. Moderate stenosis of distal left P2 PCA.   CT HEAD CODE STROKE WO CONTRAST 12/11/2020 IMPRESSION:  1. Hypodensity left anterior frontal lobe compatible with infarct in the ACA territory. This is likely chronic but could be subacute. No acute hemorrhage.  2. ASPECTS is 10    MR BRAIN WO CONTRAST 12/12/2020 at 1156 IMPRESSION:  Acute right MCA territory cortical infarcts within the right frontoparietal operculum and more posteriorly and superiorly within the right parietal lobe. Acute right PCA territory cortical infarcts within the right parietooccipital lobes. Additional 12 mm acute infarct within the right callosal splenium. Late subacute or chronic cortical infarct within the paramedian left frontal lobe (ACA vascular territory). Late subacute lacunar infarct within the mid right frontal lobe white matter. Background moderate cerebral white matter chronic small vessel ischemic disease. Small chronic left thalamic lacunar infarct. Mild cerebral atrophy.   ECHOCARDIOGRAM COMPLETE 12/11/2020 IMPRESSIONS   1. Left ventricular  ejection fraction, by estimation, is 30 to 35%. The left ventricle has moderately decreased function. There is severe hypokinesis of the entire inferior and inferolateral walls. The rest of the LV segments are moderately hypokinetic.  The left ventricular internal cavity size was mildly dilated. There is mild concentric  left ventricular hypertrophy. Left ventricular diastolic parameters are consistent with Grade II diastolic dysfunction (pseudonormalization).   2. Right ventricular systolic function is normal. The right ventricular size is normal.   3. The mitral valve is normal in structure. Mild mitral valve regurgitation.   4. The aortic valve is tricuspid. There is mild calcification of the aortic valve. There is mild thickening of the aortic valve. Aortic valve regurgitation is not visualized.   5. The inferior vena cava is normal in size with greater than 50% respiratory variability, suggesting right atrial pressure of 3 mmHg. Comparison(s): Compared to prior echo on 08/02/2020, there is no significant change. Conclusion(s)/Recommendation(s): No intracardiac source of embolism detected on this transthoracic study. A transesophageal echocardiogram is recommended to exclude cardiac source of embolism if clinically indicated.   ECG - SR rate 86 BPM. (See cardiology reading for complete details)  CT ANGIO CHEST PE W OR WO CONTRAST  Result Date: 12/14/2020 CLINICAL DATA:  Inpatient. Multifocal embolic stroke. Strongly positive bubble study on TEE today with small patent foramen ovale. Evaluate for pulmonary AV fistula. EXAM: CT ANGIOGRAPHY CHEST WITH CONTRAST TECHNIQUE: Multidetector CT imaging of the chest was performed using the standard protocol during bolus administration of intravenous contrast. Multiplanar CT image reconstructions and MIPs were obtained to evaluate the vascular anatomy. CONTRAST:  54mL OMNIPAQUE IOHEXOL 350 MG/ML SOLN COMPARISON:  None. FINDINGS: Cardiovascular: Mild cardiomegaly.  No significant pericardial effusion/thickening. Three-vessel coronary atherosclerosis. Atherosclerotic nonaneurysmal thoracic aorta. Normal caliber pulmonary arteries. No pulmonary emboli. There is a large pulmonary arteriovenous fistula in the medial left lower lobe measuring 3.1 x 1.9 cm (series 6/image 230 and series 8/images 68-77). Mediastinum/Nodes: No discrete thyroid nodules. Unremarkable esophagus. No pathologically enlarged axillary, mediastinal or hilar lymph nodes. Lungs/Pleura: No pneumothorax. No pleural effusion. Mild centrilobular emphysema. Right middle lobe 3 mm solid pulmonary nodule (series 7/image 59). No acute consolidative airspace disease, lung masses or additional significant pulmonary nodules. Upper abdomen: No acute abnormality. Musculoskeletal: No aggressive appearing focal osseous lesions. Mild thoracic spondylosis. Review of the MIP images confirms the above findings. IMPRESSION: 1. Large 3.1 x 1.9 cm pulmonary arteriovenous fistula in the medial left lower lobe. 2. Right middle lobe 3 mm solid pulmonary nodule. Follow-up noncontrast chest CT recommended in 12 months in this high risk patient.This recommendation follows the consensus statement: Guidelines for Management of Incidental Pulmonary Nodules Detected on CT Images: From the Fleischner Society 2017; Radiology 2017; 284:228-243. 3. Mild cardiomegaly. 4. Aortic Atherosclerosis (ICD10-I70.0) and Emphysema (ICD10-J43.9). Electronically Signed   By: Ilona Sorrel M.D.   On: 12/14/2020 20:01   ECHO TEE  Result Date: 12/14/2020    TRANSESOPHOGEAL ECHO REPORT   Patient Name:   Mike [name] Date of Exam: 12/14/2020 Medical Rec #:  [id number]         Height:       72.0 in Accession #:    [id number]        Weight:       140.0 lb Date of Birth:  Jan 03, 1961          BSA:          1.831 m Patient Age:    82 years          BP:           141/81 mmHg Patient Gender: M                 HR:           67 bpm. Exam  Location:  Inpatient  Procedure: Transesophageal Echo, 3D Echo, Saline Contrast Bubble Study, Color            Doppler and Cardiac Doppler Indications:     Stroke  History:         Patient has prior history of Echocardiogram examinations, most                  recent 12/11/2020. Previous Myocardial Infarction and CAD; Risk                  Factors:Hypertension and Dyslipidemia.  Sonographer:     Bernadene Person RDCS Referring Phys:  [id number] [facility] J PATWARDHAN Diagnosing Phys: Vernell Leep MD PROCEDURE: After discussion of the risks and benefits of a TEE, an informed consent was obtained from the patient. The transesophogeal probe was passed without difficulty through the esophogus of the patient. Sedation performed by different physician. The patient was monitored while under deep sedation. Anesthestetic sedation was provided intravenously by Anesthesiology: 176.05mg  of Propofol, 60mg  of Lidocaine. The patient developed no complications during the procedure. IMPRESSIONS  1. Left ventricular ejection fraction, by estimation, is 30 to 35%. The left ventricle has moderately decreased function. Mild global and moderate inferolateral hypokinesis.     Questionable echolucency next to posterior papillary muscle. See separate Definity contrast images performed same day for details.  2. Right ventricular systolic function is normal. The right ventricular size is normal.  3. Left atrial size was mildly dilated. No left atrial/left atrial appendage thrombus was detected.  4. The mitral valve is grossly normal. Moderate mitral valve regurgitation.  5. The aortic valve is tricuspid. Aortic valve regurgitation is not visualized.  6. There is mild (Grade II) plaque involving the ascending aorta.  7. Evidence of atrial level shunting detected by color flow Doppler. Small tunneled PFO, as well as small secundum ASD. Strongly positive bubble study out of proportion to the above interatrial shunts. Consider additional extra-cardiac shunting, such as   pulmonary AV malformation/fistula. FINDINGS  Left Ventricle: Left ventricular ejection fraction, by estimation, is 30 to 35%. The left ventricle has moderately decreased function. The left ventricle demonstrates regional wall motion abnormalities. The left ventricular internal cavity size was normal in size.  LV Wall Scoring: Mild global and moderate inferolateral hypokinesis. Questionable echolucency next to posterior papillary muscle. See separate Definity contrast images performed same day for details. Right Ventricle: The right ventricular size is normal. No increase in right ventricular wall thickness. Right ventricular systolic function is normal. Left Atrium: Left atrial size was mildly dilated. No left atrial/left atrial appendage thrombus was detected. Right Atrium: Right atrial size was normal in size. Pericardium: There is no evidence of pericardial effusion. Mitral Valve: The mitral valve is grossly normal. Moderate mitral valve regurgitation. Tricuspid Valve: The tricuspid valve is grossly normal. Tricuspid valve regurgitation is not demonstrated. Aortic Valve: The aortic valve is tricuspid. Aortic valve regurgitation is not visualized. Pulmonic Valve: The pulmonic valve was grossly normal. Pulmonic valve regurgitation is not visualized. Aorta: The aortic root is normal in size and structure. There is mild (Grade II) plaque involving the ascending aorta. Venous: A systolic blunting flow pattern is recorded from the left lower pulmonary vein. IAS/Shunts: Evidence of atrial level shunting detected by color flow Doppler. Agitated saline contrast was given intravenously to evaluate for intracardiac shunting. Small tunneled PFO, as well as small secundum ASD. Strongly positive bubble study out of  proportion to the above interatrial shunts. Consider additional extra-cardiac shunting, such as  pulmonary AV malformation/fistula.  MR Peak grad:    93.7 mmHg MR Mean grad:    57.0 mmHg MR Vmax:         484.00 cm/s  MR Vmean:        355.0 cm/s MR PISA:         1.01 cm MR PISA Eff ROA: 8 mm MR PISA Radius:  0.40 cm Vernell Leep MD Electronically signed by Vernell Leep MD Signature Date/Time: 12/14/2020/3:57:12 PM    Final    ECHOCARDIOGRAM LIMITED  Result Date: 12/14/2020    ECHOCARDIOGRAM LIMITED REPORT   Patient Name:   Mike [name] Date of Exam: 12/14/2020 Medical Rec #:  [id number]         Height:       72.0 in Accession #:    [id number]        Weight:       140.0 lb Date of Birth:  05/03/1961          BSA:          1.831 m Patient Age:    1 years          BP:           134/84 mmHg Patient Gender: M                 HR:           72 bpm. Exam Location:  Inpatient Procedure: Limited Echo and Intracardiac Opacification Agent Indications:     Stroke 434.91 / I163.9  History:         Patient has prior history of Echocardiogram examinations, most                  recent 12/11/2020. CHF, Previous Myocardial Infarction and CAD,                  PAD; Risk Factors:Hypertension and Dyslipidemia.  Sonographer:     Darlina Sicilian RDCS Referring Phys:  [id number] [employer] J PATWARDHAN Diagnosing Phys: Vernell Leep MD IMPRESSIONS  1. Left ventricular ejection fraction, by estimation, is 30 to 35%. The left ventricle has moderately decreased function. The left ventricle demonstrates regional wall motion abnormalities (see scoring diagram/findings for description). Mild global hypokinesis, severe inferolateral hypokinesis. No thrombus seen.  2. Right ventricular systolic function is normal. FINDINGS  Left Ventricle: Left ventricular ejection fraction, by estimation, is 30 to 35%. The left ventricle has moderately decreased function. The left ventricle demonstrates regional wall motion abnormalities. Definity contrast agent was given IV to delineate the left ventricular endocardial borders.  LV Wall Scoring: Mild global hypokinesis, severe inferolateral hypokinesis. No thrombus seen. Right Ventricle: No increase in right  ventricular wall thickness. Right ventricular systolic function is normal.  LV Volumes (MOD) LV vol d, MOD A2C: 159.5 ml LV vol d, MOD A4C: 175.0 ml LV vol s, MOD A2C: 112.0 ml LV vol s, MOD A4C: 98.6 ml LV SV MOD A2C:     47.5 ml LV SV MOD A4C:     175.0 ml LV SV MOD BP:      64.1 ml Manish Patwardhan MD Electronically signed by Vernell Leep MD Signature Date/Time: 12/14/2020/3:41:44 PM    Final        HISTORY OF PRESENT ILLNESS Mike [name] is a 59 y.o. 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 Donnetta Hutching on ASA and Plavix, HTN, HLD, CAD s/p STEMI with stenting 08/18/19, CHF, and tobacco abuse.  [name]  presents as a CODE STROKE today by EMS from inpatient rehab at [facility]. He was in PT this am and the therapist noticed a left facial droop and slurred speech.  Pt is awake, alert and oriented on arrival. EMS states he had above sx plus left arm weakness and left arm sensory loss with left arm drift. In the ED, pt has no sensation in his LUE, but normal elsewhere. No aphasia or dysarthria noted. + slight left facial droop. Strength was normal on arrival. EMS stated this was already an improvement as to what they saw on transport. NIHSS on arrival was 5.  Pt was taken immediately to CT scanner. No acute CVA noted on CT. Pt was noted after futher exam to have left downward hemianopia and left sided neglect. CTA head and neck showed no LVO. TPA was given at 1051.  By the end of procedure, pt was already improving. With tPA infusing, his NIHSS was down to 2. His vision had corrected and sensation was back intact although slightly decreased to light touch LUE.  LKW: 0930 tpa given?: Yes 7619 IR Thrombectomy? No, no LVO Modified Rankin: 2 NIHSS: 5 down to 2.   HOSPITAL COURSE Mike [name] is a 59 y.o. male with history of PAD/gangrene s/p left AKA 11/06/20 by Dr. Donnetta Hutching on ASA and Plavix, HTN, HLD, CAD s/p STEMI with stenting 08/18/19, CHF, and tobacco abuse presenting with  LUE weakness and numbness, slurred speech and facial droop. The patient received IV t-PA Tues 12/14 at 1100.  Stroke:  Multifocal small patchy Rt MCA and PCA acute and left ACA subacute/chronic infarcts - embolic - concerning for cardiac source. However, pt does have multifocal intracranial stenosis.  CT Head - Hypodensity left anterior frontal lobe compatible with infarct in the ACA territory. This is likely chronic but could be subacute. No acute hemorrhage. ASPECTS is 10    CTA H&N - Atherosclerotic irregularity of distal ACA and MCA branches including suspected high-grade stenosis of distal right M2 MCA branch. Intracranial left vertebral artery atherosclerosis with focal marked stenosis proximally. Moderate stenosis of distal left P2 PCA.   MRI head - Acute right MCA territory cortical infarcts within the right frontoparietal operculum and more posteriorly and superiorly within the right parietal lobe. Acute right PCA territory cortical infarcts within the right parietooccipital lobes. Additional 12 mm acute infarct within the right callosal splenium. Late subacute or chronic cortical infarct within the paramedian left frontal lobe (ACA vascular territory). Late subacute lacunar infarct within the mid right frontal lobe white matter. Small chronic left thalamic lacunar infarct.  2D Echo - EF 30 - 35%. No cardiac source of emboli identified.   TEE 12/17 PFO present, cannot rule out pulmonary AV fistula  Lacey Jensen Virus 2 - negative  LDL - 74  HgbA1c - 4.7  UDS - negative  VTE prophylaxis - SCDs  aspirin 81 mg daily and clopidogrel 75 mg daily prior to admission, now on  ASA 81 mg and plavix.  (Xarelto 2.5mg  bid discontinued secondary to oral bleeding following TEE)  Therapy recommendations:  HH OT, HH PT, RW w/ 5" wheels, WC, WC cushion  Disposition:  Pending  PAD S/p left AKA  Followed with vascular surgery  Left AKA surgery 10/2020  In rehab PTA for SNF post  surgery  CAD/MI CHF  07/2020 STEMI  S/p stenting  On DAPT  EF 30-35% in 07/2020  Follows with Dr. Celso Sickle as outpt  PFO  TEE showed positive PFO  We will consider PFO closure  Follow with Dr. Celso Sickle and Dr. Einar Gip  Pulmonary AV fistula  CTA chest showed large pulmonary AV fistula  Will refer to interventional radiology by Dr. Celso Sickle  Hypertension  Home BP meds: lasix  Current BP meds: none   Stable  Long-term BP goal normotensive  Hyperlipidemia  Home Lipid lowering medication: Crestor 10 mg daaily  LDL 74, goal < 70  Current on Crestor to 20 mg daily  Continue statin at discharge  Tobacco abuse  smoker before AKA  Smoking cessation counseling again provided  Pt is willing to quit  Other Stroke Risk Factors  Hx stroke/TIA by imaging  Other Active Problems   Code status - DNR   Urinary retenion, on flomax    RN Pressure Injury Documentation: Pressure Injury 11/09/20 Sacrum Right;Medial Stage 2 -  Partial thickness loss of dermis presenting as a shallow open injury with a red, pink wound bed without slough. pink. no bleeding or drainage (Active)  11/09/20 0127  Location: Sacrum  Location Orientation: Right;Medial  Staging: Stage 2 -  Partial thickness loss of dermis presenting as a shallow open injury with a red, pink wound bed without slough.  Wound Description (Comments): pink. no bleeding or drainage  Present on Admission: No    DISCHARGE EXAM Vitals:   12/14/20 0840 12/14/20 1035 12/14/20 1245 12/14/20 1559  BP: 119/66 133/65 134/84 107/73  Pulse: (!) 58 68 72 (!) 103  Resp: 12 18 16 16   Temp:  97.9 F (36.6 C)  98.6 F (37 C)  TempSrc:  Oral  Oral  SpO2: 98% 98% 100% 100%  Weight:      Height:       General - Well nourished, well developed, in no apparent distress.  Ophthalmologic - fundi not visualized due to noncooperation.  Cardiovascular - Regular rhythm and rate.  Mental Status -  Level of  arousal and orientation to time, place, and person were intact. Language including expression, naming, repetition, comprehension was assessed and found intact. Attention span and concentration were normal. Fund of Knowledge was assessed and was intact.  Cranial Nerves II - XII - II - Visual field intact OU. III, IV, VI - Extraocular movements intact. V - Facial sensation intact bilaterally. VII - Facial movement intact bilaterally. VIII - Hearing & vestibular intact bilaterally. X - Palate elevates symmetrically. XI - Chin turning & shoulder shrug intact bilaterally. XII - Tongue protrusion intact.  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  Bulk was normal and fasciculations were absent.   Motor Tone - Muscle tone was assessed at the neck and appendages and was normal.  Reflexes - The patient's reflexes were symmetrical in all extremities and he had no pathological reflexes.  Sensory - Light touch, temperature/pinprick were assessed and were symmetrical.    Coordination - The patient had normal movements in the hands with no ataxia or dysmetria.  Tremor was absent.  Gait and Station - deferred.  DISCHARGE INSTRUCTIONS GIVEN TO THE PATIENT 1. Home therapy will be scheduled. 2. A nurse will come to the home to evaluate your pressure ulcer and bleeding from tongue. The bleeding may need further treatment. 3. Aspirin and Plavix  4. It is very important that you quit smoking   DISCHARGE DIET   Diet Order            Diet Heart Room service appropriate? Yes; Fluid consistency: Thin  Diet effective now                liquids  DISCHARGE PLAN  Disposition: Discharge to home.  aspirin 81 mg daily and clopidogrel 75 mg daily for secondary stroke prevention.  Ongoing risk factor control by Primary Care Physician at time of discharge  Follow-up Patient, No Pcp Per in 2 weeks.  Follow-up in [facility] in 4 weeks, office to schedule an appointment.   Follow-up with Dr Virgina Jock on 12/20/20  40 minutes were spent preparing discharge.  Rosalin Hawking, MD PhD Stroke Neurology 12/14/2020 8:41 PM

## 2020-12-14 NOTE — Progress Notes (Signed)
  Echocardiogram Echocardiogram Transesophageal has been performed.  Fidel Levy 12/14/2020, 12:38 PM

## 2020-12-14 NOTE — Discharge Instructions (Signed)
  1. Home therapy will be scheduled. 2. A nurse will come to the home to evaluate your pressure ulcer and bleeding from tongue. The bleeding may need further treatment. 3. Continue Aspirin and Plavix  4. It is very important that you quit smoking 5. Follow up with Dr. Thomasenia Bottoms on 12/20/20

## 2020-12-16 ENCOUNTER — Encounter (HOSPITAL_COMMUNITY): Payer: Self-pay | Admitting: Cardiology

## 2020-12-18 ENCOUNTER — Other Ambulatory Visit: Payer: Self-pay | Admitting: *Deleted

## 2020-12-18 NOTE — Patient Instructions (Signed)
Stroke Prevention Some medical conditions and lifestyle choices can lead to a higher risk for a stroke. You can help to prevent a stroke by making nutrition, lifestyle, and other changes. What nutrition changes can be made?   Eat healthy foods. ? Choose foods that are high in fiber. These include:  Fresh fruits.  Fresh vegetables.  Whole grains. ? Eat at least 5 or more servings of fruits and vegetables each day. Try to fill half of your plate at each meal with fruits and vegetables. ? Choose lean protein foods. These include:  Lowfat (lean) cuts of meat.  Chicken without skin.  Fish.  Tofu.  Beans.  Nuts. ? Eat low-fat dairy products. ? Avoid foods that:  Are high in salt (sodium).  Have saturated fat.  Have trans fat.  Have cholesterol.  Are processed.  Are premade.  Follow eating guidelines as told by your doctor. These may include: ? Reducing how many calories you eat and drink each day. ? Limiting how much salt you eat or drink each day to 1,500 milligrams (mg). ? Using only healthy fats for cooking. These include:  Olive oil.  Canola oil.  Sunflower oil. ? Counting how many carbohydrates you eat and drink each day. What lifestyle changes can be made?  Try to stay at a healthy weight. Talk to your doctor about what a good weight is for you.  Get at least 30 minutes of moderate physical activity at least 5 days a week. This can include: ? Fast walking. ? Biking. ? Swimming.  Do not use any products that have nicotine or tobacco. This includes cigarettes and e-cigarettes. If you need help quitting, ask your doctor. Avoid being around tobacco smoke in general.  Limit how much alcohol you drink to no more than 1 drink a day for nonpregnant women and 2 drinks a day for men. One drink equals 12 oz of beer, 5 oz of wine, or 1 oz of hard liquor.  Do not use drugs.  Avoid taking birth control pills. Talk to your doctor about the risks of taking birth  control pills if: ? You are over 35 years old. ? You smoke. ? You get migraines. ? You have had a blood clot. What other changes can be made?  Manage your cholesterol. ? It is important to eat a healthy diet. ? If your cholesterol cannot be managed through your diet, you may also need to take medicines. Take medicines as told by your doctor.  Manage your diabetes. ? It is important to eat a healthy diet and to exercise regularly. ? If your blood sugar cannot be managed through diet and exercise, you may need to take medicines. Take medicines as told by your doctor.  Control your high blood pressure (hypertension). ? Try to keep your blood pressure below 130/80. This can help lower your risk of stroke. ? It is important to eat a healthy diet and to exercise regularly. ? If your blood pressure cannot be managed through diet and exercise, you may need to take medicines. Take medicines as told by your doctor. ? Ask your doctor if you should check your blood pressure at home. ? Have your blood pressure checked every year. Do this even if your blood pressure is normal.  Talk to your doctor about getting checked for a sleep disorder. Signs of this can include: ? Snoring a lot. ? Feeling very tired.  Take over-the-counter and prescription medicines only as told by your doctor. These may   include aspirin or blood thinners (antiplatelets or anticoagulants).  Make sure that any other medical conditions you have are managed. Where to find more information  American Stroke Association: www.strokeassociation.org  National Stroke Association: www.stroke.org Get help right away if:  You have any symptoms of stroke. "BE FAST" is an easy way to remember the main warning signs: ? B - Balance. Signs are dizziness, sudden trouble walking, or loss of balance. ? E - Eyes. Signs are trouble seeing or a sudden change in how you see. ? F - Face. Signs are sudden weakness or loss of feeling of the face,  or the face or eyelid drooping on one side. ? A - Arms. Signs are weakness or loss of feeling in an arm. This happens suddenly and usually on one side of the body. ? S - Speech. Signs are sudden trouble speaking, slurred speech, or trouble understanding what people say. ? T - Time. Time to call emergency services. Write down what time symptoms started.  You have other signs of stroke, such as: ? A sudden, very bad headache with no known cause. ? Feeling sick to your stomach (nausea). ? Throwing up (vomiting). ? Jerky movements you cannot control (seizure). These symptoms may represent a serious problem that is an emergency. Do not wait to see if the symptoms will go away. Get medical help right away. Call your local emergency services (911 in the [place] Do not drive yourself to the hospital. Summary  You can prevent a stroke by eating healthy, exercising, not smoking, drinking less alcohol, and treating other health problems, such as diabetes, high blood pressure, or high cholesterol.  Do not use any products that contain nicotine or tobacco, such as cigarettes and e-cigarettes.  Get help right away if you have any signs or symptoms of a stroke. This information is not intended to replace advice given to you by your health care provider. Make sure you discuss any questions you have with your health care provider. Document Revised: 02/10/2019 Document Reviewed: 03/18/2017 Elsevier Patient Education  [place] and Cholesterol Restricted Eating Plan Getting too much fat and cholesterol in your diet may cause health problems. Choosing the right foods helps keep your fat and cholesterol at normal levels. This can keep you from getting certain diseases. Your doctor may recommend an eating plan that includes:  Total fat: ______% or less of total calories a day.  Saturated fat: ______% or less of total calories a day.  Cholesterol: less than _________mg a day.  Fiber: ______g a  day. What are tips for following this plan? Meal planning  At meals, divide your plate into four equal parts: ? Fill one-half of your plate with vegetables and green salads. ? Fill one-fourth of your plate with whole grains. ? Fill one-fourth of your plate with low-fat (lean) protein foods.  Eat fish that is high in omega-3 fats at least two times a week. This includes mackerel, tuna, sardines, and salmon.  Eat foods that are high in fiber, such as whole grains, beans, apples, broccoli, carrots, peas, and barley. General tips   Work with your doctor to lose weight if you need to.  Avoid: ? Foods with added sugar. ? Fried foods. ? Foods with partially hydrogenated oils.  Limit alcohol intake to no more than 1 drink a day for nonpregnant women and 2 drinks a day for men. One drink equals 12 oz of beer, 5 oz of wine, or 1 oz of hard liquor. Reading  food labels  Check food labels for: ? Trans fats. ? Partially hydrogenated oils. ? Saturated fat (g) in each serving. ? Cholesterol (mg) in each serving. ? Fiber (g) in each serving.  Choose foods with healthy fats, such as: ? Monounsaturated fats. ? Polyunsaturated fats. ? Omega-3 fats.  Choose grain products that have whole grains. Look for the word "whole" as the first word in the ingredient list. Cooking  Cook foods using low-fat methods. These include baking, boiling, grilling, and broiling.  Eat more home-cooked foods. Eat at restaurants and buffets less often.  Avoid cooking using saturated fats, such as butter, cream, palm oil, palm kernel oil, and coconut oil. Recommended foods  Fruits  All fresh, canned (in natural juice), or frozen fruits. Vegetables  Fresh or frozen vegetables (raw, steamed, roasted, or grilled). Green salads. Grains  Whole grains, such as whole wheat or whole grain breads, crackers, cereals, and pasta. Unsweetened oatmeal, bulgur, barley, quinoa, or brown rice. Corn or whole wheat flour  tortillas. Meats and other protein foods  Ground beef (85% or leaner), grass-fed beef, or beef trimmed of fat. Skinless chicken or [place]. Ground chicken or [place]. Pork trimmed of fat. All fish and seafood. Egg whites. Dried beans, peas, or lentils. Unsalted nuts or seeds. Unsalted canned beans. Nut butters without added sugar or oil. Dairy  Low-fat or nonfat dairy products, such as skim or 1% milk, 2% or reduced-fat cheeses, low-fat and fat-free ricotta or cottage cheese, or plain low-fat and nonfat yogurt. Fats and oils  Tub margarine without trans fats. Light or reduced-fat mayonnaise and salad dressings. Avocado. Olive, canola, sesame, or safflower oils. The items listed above may not be a complete list of foods and beverages you can eat. Contact a dietitian for more information. Foods to avoid Fruits  Canned fruit in heavy syrup. Fruit in cream or butter sauce. Fried fruit. Vegetables  Vegetables cooked in cheese, cream, or butter sauce. Fried vegetables. Grains  White bread. White pasta. White rice. Cornbread. Bagels, pastries, and croissants. Crackers and snack foods that contain trans fat and hydrogenated oils. Meats and other protein foods  Fatty cuts of meat. Ribs, chicken wings, bacon, sausage, bologna, salami, chitterlings, fatback, hot dogs, bratwurst, and packaged lunch meats. Liver and organ meats. Whole eggs and egg yolks. Chicken and [place] with skin. Fried meat. Dairy  Whole or 2% milk, cream, half-and-half, and cream cheese. Whole milk cheeses. Whole-fat or sweetened yogurt. Full-fat cheeses. Nondairy creamers and whipped toppings. Processed cheese, cheese spreads, and cheese curds. Beverages  Alcohol. Sugar-sweetened drinks such as sodas, lemonade, and fruit drinks. Fats and oils  Butter, stick margarine, lard, shortening, ghee, or bacon fat. Coconut, palm kernel, and palm oils. Sweets and desserts  Corn syrup, sugars, honey, and molasses. Candy. Jam and jelly.  Syrup. Sweetened cereals. Cookies, pies, cakes, donuts, muffins, and ice cream. The items listed above may not be a complete list of foods and beverages you should avoid. Contact a dietitian for more information. Summary  Choosing the right foods helps keep your fat and cholesterol at normal levels. This can keep you from getting certain diseases.  At meals, fill one-half of your plate with vegetables and green salads.  Eat high-fiber foods, like whole grains, beans, apples, carrots, peas, and barley.  Limit added sugar, saturated fats, alcohol, and fried foods. This information is not intended to replace advice given to you by your health care provider. Make sure you discuss any questions you have with your health care provider.  Document Revised: 08/18/2018 Document Reviewed: 09/01/2017 Elsevier Patient Education  [place].

## 2020-12-18 NOTE — Patient Outreach (Signed)
[facility]) Care Management  12/18/2020  Mike [name] [birth year] [id number]   RED ON EMMI ALERT - Stroke Day # 1 Date: 12/17/2020 Red Alert Reason: Unfilled prescriptions and Not able to take every dose of medications   Outreach attempt #1, successful.  Identity verified.  This care manager introduced self and stated purpose of call.  [place] care management services explained.    Member report he is doing fair since discharge.  He lives alone but state he has a sister in the area that is able to help when needed.  Report he is independent in his care, has not tried to shower yet, only taking wash ups.  He is still waiting on DME to be delivered to take a shower.  Report he is managing his meals and medications independently.  Medications reviewed, despite above EMMI alert, he reports having all medications listed on chart and taking as instructed.    He has follow up appointments scheduled with cardiology on 12/23 and with neurology of 2/22.  He denies any residual deficits from stroke.  Verbalizes understanding of stroke signs/symptoms and when to call 911.  He denies that he is still smoking.  No PCP listed in chart, but state his sister has scheduled appointment with [facility] physicians for Feb.    Member has home health ordered, state he has received call from [facility], denies having initial home visit scheduled.  Conference call placed to [facility], message left to inquire about start date.  Will await call back.  Plan: RN CM will send member EMMI education and follow up within the next week.  Mike [name], [place], MSN [place] [phone]

## 2020-12-19 ENCOUNTER — Encounter (HOSPITAL_BASED_OUTPATIENT_CLINIC_OR_DEPARTMENT_OTHER): Payer: BC Managed Care – PPO | Admitting: Physician Assistant

## 2020-12-19 ENCOUNTER — Other Ambulatory Visit: Payer: Self-pay

## 2020-12-19 DIAGNOSIS — Z89612 Acquired absence of left leg above knee: Secondary | ICD-10-CM | POA: Diagnosis not present

## 2020-12-19 DIAGNOSIS — I701 Atherosclerosis of renal artery: Secondary | ICD-10-CM | POA: Diagnosis not present

## 2020-12-19 DIAGNOSIS — I255 Ischemic cardiomyopathy: Secondary | ICD-10-CM | POA: Diagnosis not present

## 2020-12-19 DIAGNOSIS — I5022 Chronic systolic (congestive) heart failure: Secondary | ICD-10-CM | POA: Diagnosis not present

## 2020-12-19 DIAGNOSIS — T8131XA Disruption of external operation (surgical) wound, not elsewhere classified, initial encounter: Secondary | ICD-10-CM | POA: Diagnosis not present

## 2020-12-19 DIAGNOSIS — L89323 Pressure ulcer of left buttock, stage 3: Secondary | ICD-10-CM | POA: Diagnosis not present

## 2020-12-19 DIAGNOSIS — Z833 Family history of diabetes mellitus: Secondary | ICD-10-CM | POA: Diagnosis not present

## 2020-12-19 DIAGNOSIS — Z87891 Personal history of nicotine dependence: Secondary | ICD-10-CM | POA: Diagnosis not present

## 2020-12-19 DIAGNOSIS — Z8674 Personal history of sudden cardiac arrest: Secondary | ICD-10-CM | POA: Diagnosis not present

## 2020-12-19 DIAGNOSIS — Z955 Presence of coronary angioplasty implant and graft: Secondary | ICD-10-CM | POA: Diagnosis not present

## 2020-12-19 DIAGNOSIS — I739 Peripheral vascular disease, unspecified: Secondary | ICD-10-CM | POA: Diagnosis not present

## 2020-12-19 DIAGNOSIS — X58XXXA Exposure to other specified factors, initial encounter: Secondary | ICD-10-CM | POA: Diagnosis not present

## 2020-12-19 DIAGNOSIS — Z8249 Family history of ischemic heart disease and other diseases of the circulatory system: Secondary | ICD-10-CM | POA: Diagnosis not present

## 2020-12-19 DIAGNOSIS — Z8673 Personal history of transient ischemic attack (TIA), and cerebral infarction without residual deficits: Secondary | ICD-10-CM | POA: Diagnosis not present

## 2020-12-19 DIAGNOSIS — I2510 Atherosclerotic heart disease of native coronary artery without angina pectoris: Secondary | ICD-10-CM | POA: Diagnosis not present

## 2020-12-19 DIAGNOSIS — I252 Old myocardial infarction: Secondary | ICD-10-CM | POA: Diagnosis not present

## 2020-12-19 NOTE — Progress Notes (Signed)
Mike [name], Mike [name] ([id number]) Visit Report for 12/19/2020 Arrival Information Details Patient Name: Date of Service: Mike [name]. 12/19/2020 12:30 PM Medical Record Number: [id number] Patient Account Number: [id number] Date of Birth/Sex: Treating RN: [birth year] (59 y.o. Mike [name] Primary Care Provider: PA Haig Prophet, NO Other Clinician: Referring Provider: Treating Provider/Extender: Mike [name], Mike [name]: 3 Visit Information History Since Last Visit Added or deleted any medications: No Patient Arrived: Wheel Chair Any new allergies or adverse reactions: No Arrival Time: 12:57 Had a fall or experienced change in No Accompanied By: alone activities of daily living that may affect Transfer Assistance: None risk of falls: Patient Identification Verified: Yes Signs or symptoms of abuse/neglect since last visito No Secondary Verification Process Completed: Yes Hospitalized since last visit: Yes Patient Requires Transmission-Based Precautions: No Implantable device outside of the clinic excluding No Patient Has Alerts: Yes cellular tissue based products placed in the center Patient Alerts: Patient on Blood Thinner since last visit: Has Dressing in Place as Prescribed: Yes Pain Present Now: No Electronic Signature(s) Signed: 12/19/2020 7:00:57 PM By: [name], [name] Entered By: [name] on 12/19/2020 12:57:31 -------------------------------------------------------------------------------- Encounter Discharge Information Details Patient Name: Date of Service: Mike Dike HN S. 12/19/2020 12:30 PM Medical Record Number: [id number] Patient Account Number: [id number] Date of Birth/Sex: Treating RN: Mike [name][birth year] (59 y.o. Mike [name] Primary Care Provider: PA Haig Prophet, NO Other Clinician: Referring Provider: Treating Provider/Extender: Mike [name], Mike [name]: 3 Encounter Discharge Information  Items Post Procedure Vitals Discharge Condition: Stable Temperature (F): 98.2 Ambulatory Status: Walker Pulse (bpm): 77 Discharge Destination: Home Respiratory Rate (breaths/min): 18 Transportation: Private Auto Blood Pressure (mmHg): 166/87 Accompanied By: self Schedule Follow-up Appointment: Yes Clinical Summary of Care: Patient Declined Electronic Signature(s) Signed: 12/19/2020 5:14:06 PM By: Carlene Coria RN Entered By: [name] on 12/19/2020 14:00:13 -------------------------------------------------------------------------------- [place] Details Patient Name: Date of Service: Mike Dike HN S. 12/19/2020 12:30 PM Medical Record Number: [id number] Patient Account Number: [id number] Date of Birth/Sex: Treating RN: [birth year]Mike [name] (59 y.o. Mike [name] Primary Care Provider: PA Haig Prophet, NO Other Clinician: Referring Provider: Treating Provider/Extender: Mike [name], Mike [name]: 3 Active Inactive Wound/Skin Impairment Nursing Diagnoses: Impaired tissue integrity Knowledge deficit related to ulceration/compromised skin integrity Goals: Patient/caregiver will verbalize understanding of skin care regimen Date Initiated: 11/28/2020 Target Resolution Date: 01/18/2021 Goal Status: Active Ulcer/skin breakdown will have a volume reduction of 30% by week 4 Date Initiated: 11/28/2020 Date Inactivated: 12/19/2020 Target Resolution Date: 12/26/2020 Goal Status: Met Interventions: Assess patient/caregiver ability to obtain necessary supplies Assess patient/caregiver ability to perform ulcer/skin care regimen upon admission and as needed Assess ulceration(s) every visit Provide education on ulcer and skin care [name] Activities: Skin care regimen initiated : 11/28/2020 Topical wound management initiated : 11/28/2020 Notes: Electronic Signature(s) Signed: 12/19/2020 7:00:57 PM By: [name], [name] Entered By: [name] on 12/19/2020 14:14:25 -------------------------------------------------------------------------------- Pain Assessment Details Patient Name: Date of Service: Mike Dike HN S. 12/19/2020 12:30 PM Medical Record Number: [id number] Patient Account Number: [id number] Date of Birth/Sex: Treating RN: [birth year] (59 y.o. Mike [name] Primary Care Provider: PA Haig Prophet, NO Other Clinician: Referring Provider: Treating Provider/Extender: Mike [name], Mike [name]: 3 Active Problems Location of Pain Severity and Description of Pain Patient Has Paino No Site Locations Pain Management and Medication Current Pain Management: Electronic Signature(s) Signed: 12/19/2020 7:00:57 PM By: [name], [name] Entered By: Mike [name],  Mike [name] on 12/19/2020 12:58:34 -------------------------------------------------------------------------------- Patient/Caregiver Education Details Patient Name: Date of Service: Mike [name] 12/22/2021andnbsp12:30 PM Medical Record Number: [id number] Patient Account Number: [id number] Date of Birth/Gender: Treating RN: [birth year] (59 y.o. Mike [name] Primary Care Physician: PA Haig Prophet, NO Other Clinician: Referring Physician: Treating Physician/Extender: Mike [name], Mike [name]: 3 Education Assessment Education Provided To: Patient Education Topics Provided Wound/Skin Impairment: Methods: Explain/Verbal Responses: State content correctly Electronic Signature(s) Signed: 12/19/2020 7:00:57 PM By: [name], [name] Entered By: [name] on 12/19/2020 14:14:35 -------------------------------------------------------------------------------- Wound Assessment Details Patient Name: Date of Service: Mike Dike HN S. 12/19/2020 12:30 PM Medical Record Number: [id number] Patient Account Number: [id number] Date of Birth/Sex: Treating RN: Mike [name], Mike [name] (59 y.o. Mike [name] Primary Care Provider: PA TIENT, NO Other Clinician: Referring Provider: Treating Provider/Extender: Mike [name], Mike [name]: 3 Wound Status Wound Number: 1 Primary Pressure Ulcer Etiology: Wound Location: Left, Proximal Gluteus Wound Healed - Epithelialized Wounding Event: Pressure Injury Status: Date Acquired: 10/17/2020 Comorbid Sleep Apnea, Coronary Artery Disease, Hypotension, Myocardial Weeks Of [name]: 3 History: Infarction, Peripheral Arterial Disease Clustered Wound: No Wound Measurements Length: (cm) Width: (cm) Depth: (cm) Area: (cm) Volume: (cm) 0 % Reduction in Area: 100% 0 % Reduction in Volume: 100% 0 Epithelialization: Large (67-100%) 0 Tunneling: No 0 Undermining: No Wound Description Classification: Category/Stage II Wound Margin: Distinct, outline attached Exudate Amount: None Present Foul Odor After Cleansing: No Slough/Fibrino No Wound Bed Granulation Amount: None Present (0%) Exposed Structure Necrotic Amount: None Present (0%) Fascia Exposed: No Fat Layer (Subcutaneous Tissue) Exposed: No Tendon Exposed: No Muscle Exposed: No Joint Exposed: No Bone Exposed: No Electronic Signature(s) Signed: 12/19/2020 7:00:57 PM By: [name], [name] Entered By: [name] on 12/19/2020 13:09:03 -------------------------------------------------------------------------------- Wound Assessment Details Patient Name: Date of Service: Mike Dike HN S. 12/19/2020 12:30 PM Medical Record Number: [id number] Patient Account Number: [id number] Date of Birth/Sex: Treating RN: [name]/[birth year]/Mike [name] (58 y.o. Mike [name] Primary Care Provider: PA TIENT, NO Other Clinician: Referring Provider: Treating Provider/Extender: Mike [name], Mike [name]: 3 Wound Status Wound Number: 2 Primary Pressure Ulcer Etiology: Wound Location: Left, Distal Gluteus Wound Healed -  Epithelialized Wounding Event: Pressure Injury Status: Date Acquired: 10/17/2020 Comorbid Sleep Apnea, Coronary Artery Disease, Hypotension, Myocardial Weeks Of [name]: 3 History: Infarction, Peripheral Arterial Disease Clustered Wound: No Wound Measurements Length: (cm) Width: (cm) Depth: (cm) Area: (cm) Volume: (cm) 0 % Reduction in Area: 100% 0 % Reduction in Volume: 100% 0 Epithelialization: Large (67-100%) 0 Tunneling: No 0 Undermining: No Wound Description Classification: Category/Stage III Wound Margin: Distinct, outline attached Exudate Amount: None Present Wound Bed Granulation Amount: None Present (0%) Necrotic Amount: None Present (0%) Foul Odor After Cleansing: No Slough/Fibrino No Exposed Structure Fascia Exposed: No Fat Layer (Subcutaneous Tissue) Exposed: No Tendon Exposed: No Muscle Exposed: No Joint Exposed: No Bone Exposed: No Electronic Signature(s) Signed: 12/19/2020 7:00:57 PM By: [name], [name] Entered By: [name] on 12/19/2020 13:09:14 -------------------------------------------------------------------------------- Wound Assessment Details Patient Name: Date of Service: Mike Dike HN S. 12/19/2020 12:30 PM Medical Record Number: [id number] Patient Account Number: [id number] Date of Birth/Sex: Treating RN: [birth year] Mike [name] (59 y.o. Mike [name] Primary Care Provider: PA Haig Prophet, NO Other Clinician: Referring Provider: Treating Provider/Extender: Mike [name], Mike [name]: 3 Wound Status Wound Number: 3 Primary Dehisced Wound Etiology: Wound Location: Left, Medial Amputation Site - Above Knee Wound Open Wounding Event: Surgical Injury  Status: Date Acquired: 11/06/2020 Comorbid Sleep Apnea, Coronary Artery Disease, Hypotension, Myocardial Weeks Of [name]: 0 History: Infarction, Peripheral Arterial Disease Clustered Wound: No Wound Measurements Length: (cm) 2.5 Width: (cm)  5.8 Depth: (cm) 1 Area: (cm) 11.388 Volume: (cm) 11.388 % Reduction in Area: 0% % Reduction in Volume: 0% Epithelialization: None Tunneling: No Undermining: No Wound Description Classification: Full Thickness Without Exposed Support Structures Wound Margin: Well defined, not attached Exudate Amount: Medium Exudate Type: Serosanguineous Exudate Color: red, brown Foul Odor After Cleansing: No Slough/Fibrino Yes Wound Bed Granulation Amount: Small (1-33%) Exposed Structure Granulation Quality: Pink Fascia Exposed: No Necrotic Amount: Large (67-100%) Fat Layer (Subcutaneous Tissue) Exposed: Yes Necrotic Quality: Eschar Tendon Exposed: No Muscle Exposed: No Joint Exposed: No Bone Exposed: No [name] Notes Wound #3 (Amputation Site - Above Knee) Wound Laterality: Left, Medial Cleanser Soap and Water Discharge Instruction: May shower and wash wound with dial antibacterial soap and water prior to dressing change. [employer] Ancillary Kit - 15 Day Supply Discharge Instruction: Use supplies as instructed; Kit contains: (15) Saline Bullets; (15) 3x3 Gauze; 15 pr Gloves Peri-Wound Care Topical Primary Dressing Santyl Ointment Discharge Instruction: Apply nickel thick amount to wound bed as instructed Saline moistened gauze Discharge Instruction: Apply saline moistened gauze over Santyl Secondary Dressing Woven Gauze Sponge, Non-Sterile 4x4 in Discharge Instruction: Apply over primary dressing as directed. Bordered Gauze, 6x6 (in/in) Discharge Instruction: Apply over primary dressing as directed. Secured With Compression Wrap Compression Stockings Environmental education officer) Signed: 12/19/2020 7:00:57 PM By: [name], [name] Entered By: [name] on 12/19/2020 13:36:23 -------------------------------------------------------------------------------- Vitals Details Patient Name: Date of Service: [name], JO HN S. 12/19/2020 12:30 PM Medical Record Number:  [id number] Patient Account Number: [id number] Date of Birth/Sex: Treating RN: [birth year] (59 y.o. Mike [name] Primary Care Luci Bellucci: PA TIENT, NO Other Clinician: Referring Tod Abrahamsen: Treating Janaiyah Blackard/Extender: Mike [name], Mike [name]: 3 Vital Signs Time Taken: 12:57 Temperature (F): 98.2 Height (in): 72 Pulse (bpm): 77 Weight (lbs): 135 Respiratory Rate (breaths/min): 18 Body Mass Index (BMI): 18.3 Blood Pressure (mmHg): 166/87 Reference Range: 80 - 120 mg / dl Electronic Signature(s) Signed: 12/19/2020 7:00:57 PM By: [name], [name] Entered By: [name] on 12/19/2020 12:57:50

## 2020-12-19 NOTE — Progress Notes (Signed)
DIERKS, WACH ([id number]) Visit Report for 12/19/2020 Chief Complaint Document Details Patient Name: Date of Service: Mike [name]. 12/19/2020 12:30 PM Medical Record Number: [id number] Patient Account Number: [id number] Date of Birth/Sex: Treating RN: 12/07/1961 (59 y.o. Mike [name] Primary Care Provider: PA Haig Prophet, NO Other Clinician: Referring Provider: Treating Provider/Extender: Mardella Layman, Manish Weeks in Treatment: 3 Information Obtained from: Patient Chief Complaint Left gluteal pressure ulcer Electronic Signature(s) Signed: 12/19/2020 1:07:10 PM By: Worthy Keeler PA-C Entered By: Worthy Keeler on 12/19/2020 13:07:09 -------------------------------------------------------------------------------- Debridement Details Patient Name: Date of Service: Mike Dike HN S. 12/19/2020 12:30 PM Medical Record Number: [id number] Patient Account Number: [id number] Date of Birth/Sex: Treating RN: 08-Nov-1961 (59 y.o. Mike [name] Primary Care Provider: PA Haig Prophet, NO Other Clinician: Referring Provider: Treating Provider/Extender: Mardella Layman, Manish Weeks in Treatment: 3 Debridement Performed for Assessment: Wound #3 Left,Medial Amputation Site - Above Knee Performed By: Clinician Carlene Coria, RN Debridement Type: Chemical/Enzymatic/Mechanical Agent Used: Santyl Level of Consciousness (Pre-procedure): Awake and Alert Pre-procedure Verification/Time Out Yes - 13:40 Taken: Start Time: 13:40 Bleeding: None End Time: 13:40 Procedural Pain: 0 Post Procedural Pain: 0 Response to Treatment: Procedure was tolerated well Level of Consciousness (Post- Awake and Alert procedure): Post Debridement Measurements of Total Wound Length: (cm) 2.5 Width: (cm) 5.8 Depth: (cm) 1 Volume: (cm) 11.388 Character of Wound/Ulcer Post Debridement: Requires Further Debridement Post Procedure Diagnosis Same as Pre-procedure Electronic  Signature(s) Signed: 12/19/2020 5:13:59 PM By: Worthy Keeler PA-C Signed: 12/19/2020 7:00:57 PM By: [name], [name] Entered By: [name] on 12/19/2020 13:37:15 -------------------------------------------------------------------------------- HPI Details Patient Name: Date of Service: Mike Dike HN S. 12/19/2020 12:30 PM Medical Record Number: [id number] Patient Account Number: [id number] Date of Birth/Sex: Treating RN: [birth year] (59 y.o. Mike [name] Primary Care Provider: PA Haig Prophet, NO Other Clinician: Referring Provider: Treating Provider/Extender: Mardella Layman, Manish Weeks in Treatment: 3 History of Present Illness HPI Description: 11/28/2020 patient presents for initial evaluation here in our clinic concerning issues that he has been having with wounds actually in the left gluteal region. These occurred following his surgery and he actually had surgery which included a left above-knee amputation. Unfortunately this was necessitated by the fact that the patient had a fairly complicated recent vascular and cardiac history including a STEMI on 08/17/2020. This was complicated by cardiac arrest secondary to chronic ST elevations in the inferior leads, severe bilateral renal artery stenosis, peripheral arterial disease with critical limb ischemia requiring right iliac revascularization of the left lower extremity, chronic congestive heart failure systolic with ischemic cardiomyopathy with complaints of shortness of breath, decreased ability to walk due to severe leg pain and again the patient had acute critical limb ischemia. He was too high of a risk being that he was on aspirin and Brilinta due to the recent STEMI and therefore was not a candidate for revascularization. Because he was decompensating so quickly it was elected to do an above-knee amputation to treat the progressing gangrene prior to this worsening significantly. This was done up by Dr.  Donnetta Hutching on November 06, 2020. He did have a Foley catheter during the time that he was in the hospital. He was there from October 30, 2020 through November 15, 2020. It was during this time that he developed the wounds in the gluteal region on the left. The wounds appear to be in somewhat of a linear pattern that makes me in the suspect that he may  have developed these as a result of having sat on a tubing such as a catheter tubing or something of the sort for too long causing a pressure injury although I cannot know that for sure. Not this far out. Currently the patient is on aspirin and Plavix and is currently in skilled nursing at [facility]. 12/19/2020 upon evaluation today patient appears to be doing well with regard to his gluteal wounds are completely healed. That is great news. The main issue is actually with his above-knee amputation stump on the left. He unfortunately is having trouble here with getting this to heal part of the medial aspect is eschar covered and I think this is good have to clear away in order to see this improve. I do believe however that we can initiate Santyl to try to help loosen this up in the future we should be able to debride this away much more effectively and quickly. Electronic Signature(s) Signed: 12/19/2020 1:40:47 PM By: Worthy Keeler PA-C Entered By: Worthy Keeler on 12/19/2020 13:40:47 -------------------------------------------------------------------------------- Physical Exam Details Patient Name: Date of Service: Mike Dike HN S. 12/19/2020 12:30 PM Medical Record Number: [id number] Patient Account Number: [id number] Date of Birth/Sex: Treating RN: 02-26-1961 (59 y.o. Mike [name] Primary Care Provider: PA Haig Prophet, NO Other Clinician: Referring Provider: Treating Provider/Extender: Mardella Layman, Manish Weeks in Treatment: 3 Constitutional Well-nourished and well-hydrated in no acute distress. Respiratory normal  breathing without difficulty. Psychiatric this patient is able to make decisions and demonstrates good insight into disease process. Alert and Oriented x 3. pleasant and cooperative. Notes Upon inspection patient's wound bed actually showed eschar on the amputation site of the left leg again the gluteal regions are healed. With this eschar it is very tightly adhered on condom worried that sharp debridement right now would cause more trauma than good. I would recommend that we probably use Santyl for couple weeks and see where things stand following. The patient is in agreement with that plan. Electronic Signature(s) Signed: 12/19/2020 1:41:10 PM By: Worthy Keeler PA-C Entered By: Worthy Keeler on 12/19/2020 13:41:10 -------------------------------------------------------------------------------- Physician Orders Details Patient Name: Date of Service: Mike Dike HN S. 12/19/2020 12:30 PM Medical Record Number: [id number] Patient Account Number: [id number] Date of Birth/Sex: Treating RN: [birth year] (59 y.o. Mike [name] Primary Care Provider: PA TIENT, NO Other Clinician: Referring Provider: Treating Provider/Extender: Mardella Layman, Manish Weeks in Treatment: 3 Verbal / Phone Orders: No Diagnosis Coding ICD-10 Coding Code Description T81.31XA Disruption of external operation (surgical) wound, not elsewhere classified, initial encounter L89.323 Pressure ulcer of left buttock, stage 3 I25.10 Atherosclerotic heart disease of native coronary artery without angina pectoris I73.89 Other specified peripheral vascular diseases I10 Essential (primary) hypertension Follow-up Appointments Return Appointment in 2 weeks. Bathing/ Shower/ Hygiene May shower and wash wound with soap and water. Wound Treatment Wound #3 - Amputation Site - Above Knee Wound Laterality: Left, Medial Cleanser: Soap and Water 1 x Per FXJ/88 Days Discharge Instructions: May shower and wash  wound with dial antibacterial soap and water prior to dressing change. Cleanser: Byram Ancillary Kit - 15 Day Supply (DME) (Generic) 1 x Per Day/15 Days Discharge Instructions: Use supplies as instructed; Kit contains: (15) Saline Bullets; (15) 3x3 Gauze; 15 pr Gloves Prim Dressing: Santyl Ointment 1 x Per Day/15 Days ary Discharge Instructions: Apply nickel thick amount to wound bed as instructed Prim Dressing: Saline moistened gauze 1 x Per Day/15 Days ary Discharge Instructions: Apply saline moistened  gauze over Santyl Secondary Dressing: Woven Gauze Sponge, Non-Sterile 4x4 in (DME) (Generic) 1 x Per Day/15 Days Discharge Instructions: Apply over primary dressing as directed. Secondary Dressing: Bordered Gauze, 6x6 (in/in) (DME) (Generic) 1 x Per Day/15 Days Discharge Instructions: Apply over primary dressing as directed. Patient Medications llergies: No Known Drug Allergies A Notifications Medication Indication Start End 12/19/2020 Santyl DOSE topical 250 unit/gram ointment - ointment topical Apply nickel thick daily to the wound bed and then cover with a dressing as directed in clinic Electronic Signature(s) Signed: 12/19/2020 5:13:59 PM By: Worthy Keeler PA-C Signed: 12/19/2020 7:00:57 PM By: [name], [name] Previous Signature: 12/19/2020 1:44:42 PM Version By: Worthy Keeler PA-C Previous Signature: 12/19/2020 1:44:42 PM Version By: Worthy Keeler PA-C Entered By: [name] on 12/19/2020 14:09:02 -------------------------------------------------------------------------------- Problem List Details Patient Name: Date of Service: Mike Dike HN S. 12/19/2020 12:30 PM Medical Record Number: [id number] Patient Account Number: [id number] Date of Birth/Sex: Treating RN: 03-28-1961 (59 y.o. Mike [name] Primary Care Provider: PA Haig Prophet, NO Other Clinician: Referring Provider: Treating Provider/Extender: Mardella Layman, Manish Weeks in  Treatment: 3 Active Problems ICD-10 Encounter Code Description Active Date MDM Diagnosis T81.31XA Disruption of external operation (surgical) wound, not elsewhere classified, 12/19/2020 No Yes initial encounter L89.323 Pressure ulcer of left buttock, stage 3 11/28/2020 No Yes I25.10 Atherosclerotic heart disease of native coronary artery without angina pectoris 11/28/2020 No Yes I73.89 Other specified peripheral vascular diseases 11/28/2020 No Yes I10 Essential (primary) hypertension 11/28/2020 No Yes Inactive Problems Resolved Problems Electronic Signature(s) Signed: 12/19/2020 1:31:12 PM By: Worthy Keeler PA-C Previous Signature: 12/19/2020 1:07:03 PM Version By: Worthy Keeler PA-C Entered By: Worthy Keeler on 12/19/2020 13:31:12 -------------------------------------------------------------------------------- Progress Note Details Patient Name: Date of Service: Mike Dike HN S. 12/19/2020 12:30 PM Medical Record Number: [id number] Patient Account Number: [id number] Date of Birth/Sex: Treating RN: May 03, 1961 (59 y.o. Mike [name] Primary Care Provider: PA Haig Prophet, NO Other Clinician: Referring Provider: Treating Provider/Extender: Mardella Layman, Manish Weeks in Treatment: 3 Subjective Chief Complaint Information obtained from Patient Left gluteal pressure ulcer History of Present Illness (HPI) 11/28/2020 patient presents for initial evaluation here in our clinic concerning issues that he has been having with wounds actually in the left gluteal region. These occurred following his surgery and he actually had surgery which included a left above-knee amputation. Unfortunately this was necessitated by the fact that the patient had a fairly complicated recent vascular and cardiac history including a STEMI on 08/17/2020. This was complicated by cardiac arrest secondary to chronic ST elevations in the inferior leads, severe bilateral renal artery stenosis,  peripheral arterial disease with critical limb ischemia requiring right iliac revascularization of the left lower extremity, chronic congestive heart failure systolic with ischemic cardiomyopathy with complaints of shortness of breath, decreased ability to walk due to severe leg pain and again the patient had acute critical limb ischemia. He was too high of a risk being that he was on aspirin and Brilinta due to the recent STEMI and therefore was not a candidate for revascularization. Because he was decompensating so quickly it was elected to do an above-knee amputation to treat the progressing gangrene prior to this worsening significantly. This was done up by Dr. Donnetta Hutching on November 06, 2020. He did have a Foley catheter during the time that he was in the hospital. He was there from October 30, 2020 through November 15, 2020. It was during this time that he developed the  wounds in the gluteal region on the left. The wounds appear to be in somewhat of a linear pattern that makes me in the suspect that he may have developed these as a result of having sat on a tubing such as a catheter tubing or something of the sort for too long causing a pressure injury although I cannot know that for sure. Not this far out. Currently the patient is on aspirin and Plavix and is currently in skilled nursing at [facility]. 12/19/2020 upon evaluation today patient appears to be doing well with regard to his gluteal wounds are completely healed. That is great news. The main issue is actually with his above-knee amputation stump on the left. He unfortunately is having trouble here with getting this to heal part of the medial aspect is eschar covered and I think this is good have to clear away in order to see this improve. I do believe however that we can initiate Santyl to try to help loosen this up in the future we should be able to debride this away much more effectively and quickly. Patient History Information  obtained from Patient. Family History Diabetes - Father, Heart Disease - Mother,Father,Maternal Grandparents,Paternal Grandparents, Hypertension - Paternal Grandparents,Maternal Grandparents,Father,Mother, Lung Disease - Father,Mother, No family history of Hereditary Spherocytosis, Kidney Disease, Seizures, Stroke, Thyroid Problems, Tuberculosis. Social History Former smoker - quit 08/01/2020, Marital Status - Divorced, Alcohol Use - Never, Drug Use - No History, Caffeine Use - Daily - soda. Medical History Eyes Denies history of Cataracts, Glaucoma, Optic Neuritis Ear/Nose/Mouth/Throat Denies history of Chronic sinus problems/congestion, Middle ear problems Hematologic/Lymphatic Denies history of Anemia, Hemophilia, Human Immunodeficiency Virus, Lymphedema, Sickle Cell Disease Respiratory Patient has history of Sleep Apnea - no CPAP Denies history of Aspiration, Asthma, Chronic Obstructive Pulmonary Disease (COPD), Pneumothorax Cardiovascular Patient has history of Coronary Artery Disease - x5 stents-x3 heart/x2 leg, Hypotension, Myocardial Infarction - 07/2020, Peripheral Arterial Disease Denies history of Angina, Arrhythmia, Congestive Heart Failure, Deep Vein Thrombosis, Hypertension, Peripheral Venous Disease, Phlebitis, Vasculitis Gastrointestinal Denies history of Cirrhosis , Colitis, Crohnoos, Hepatitis A, Hepatitis B, Hepatitis C Endocrine Denies history of Type I Diabetes, Type II Diabetes Genitourinary Denies history of End Stage Renal Disease Immunological Denies history of Lupus Erythematosus, Raynaudoos, Scleroderma Integumentary (Skin) Denies history of History of Burn Musculoskeletal Denies history of Gout, Rheumatoid Arthritis, Osteoarthritis, Osteomyelitis Neurologic Denies history of Dementia, Neuropathy, Quadriplegia, Paraplegia, Seizure Disorder Oncologic Denies history of Received Chemotherapy, Received Radiation Psychiatric Denies history of  Anorexia/bulimia, Confinement Anxiety Hospitalization/Surgery History - left AKA 119//2021. - MI 08/01/2020. - CVA 12/11/20. Medical A Surgical History Notes nd Musculoskeletal left AKA 11/9 Objective Constitutional Well-nourished and well-hydrated in no acute distress. Vitals Time Taken: 12:57 PM, Height: 72 in, Weight: 135 lbs, BMI: 18.3, Temperature: 98.2 F, Pulse: 77 bpm, Respiratory Rate: 18 breaths/min, Blood Pressure: 166/87 mmHg. Respiratory normal breathing without difficulty. Psychiatric this patient is able to make decisions and demonstrates good insight into disease process. Alert and Oriented x 3. pleasant and cooperative. General Notes: Upon inspection patient's wound bed actually showed eschar on the amputation site of the left leg again the gluteal regions are healed. With this eschar it is very tightly adhered on condom worried that sharp debridement right now would cause more trauma than good. I would recommend that we probably use Santyl for couple weeks and see where things stand following. The patient is in agreement with that plan. Integumentary (Hair, Skin) Wound #1 status is Healed - Epithelialized. Original cause of wound  was Pressure Injury. The wound is located on the Left,Proximal Gluteus. The wound measures 0cm length x 0cm width x 0cm depth; 0cm^2 area and 0cm^3 volume. There is no tunneling or undermining noted. There is a none present amount of drainage noted. The wound margin is distinct with the outline attached to the wound base. There is no granulation within the wound bed. There is no necrotic tissue within the wound bed. Wound #2 status is Healed - Epithelialized. Original cause of wound was Pressure Injury. The wound is located on the Left,Distal Gluteus. The wound measures 0cm length x 0cm width x 0cm depth; 0cm^2 area and 0cm^3 volume. There is no tunneling or undermining noted. There is a none present amount of drainage noted. The wound margin is  distinct with the outline attached to the wound base. There is no granulation within the wound bed. There is no necrotic tissue within the wound bed. Wound #3 status is Open. Original cause of wound was Surgical Injury. The wound is located on the Left,Medial Amputation Site - Above Knee. The wound measures 2.5cm length x 5.8cm width x 1cm depth; 11.388cm^2 area and 11.388cm^3 volume. There is Fat Layer (Subcutaneous Tissue) exposed. There is no tunneling or undermining noted. There is a medium amount of serosanguineous drainage noted. The wound margin is well defined and not attached to the wound base. There is small (1-33%) pink granulation within the wound bed. There is a large (67-100%) amount of necrotic tissue within the wound bed including Eschar. Assessment Active Problems ICD-10 Disruption of external operation (surgical) wound, not elsewhere classified, initial encounter Pressure ulcer of left buttock, stage 3 Atherosclerotic heart disease of native coronary artery without angina pectoris Other specified peripheral vascular diseases Essential (primary) hypertension Procedures Wound #3 Pre-procedure diagnosis of Wound #3 is a Dehisced Wound located on the Left,Medial Amputation Site - Above Knee . There was a Chemical/Enzymatic/Mechanical debridement performed by Carlene Coria, RN.Marland Kitchen Agent used was [employer]. A time out was conducted at 13:40, prior to the start of the procedure. There was no bleeding. The procedure was tolerated well with a pain level of 0 throughout and a pain level of 0 following the procedure. Post Debridement Measurements: 2.5cm length x 5.8cm width x 1cm depth; 11.388cm^3 volume. Character of Wound/Ulcer Post Debridement requires further debridement. Post procedure Diagnosis Wound #3: Same as Pre-Procedure Plan Follow-up Appointments: Return Appointment in 2 weeks. Bathing/ Shower/ Hygiene: May shower and wash wound with soap and water. The following  medication(s) was prescribed: Santyl topical 250 unit/gram ointment ointment topical Apply nickel thick daily to the wound bed and then cover with a dressing as directed in clinic starting 12/19/2020 WOUND #3: - Amputation Site - Above Knee Wound Laterality: Left, Medial Cleanser: Soap and Water 1 x Per Day/15 Days Discharge Instructions: May shower and wash wound with dial antibacterial soap and water prior to dressing change. Cleanser: Byram Ancillary Kit - 15 Day Supply (DME) (Generic) 1 x Per Day/15 Days Discharge Instructions: Use supplies as instructed; Kit contains: (15) Saline Bullets; (15) 3x3 Gauze; 15 pr Gloves Prim Dressing: Santyl Ointment 1 x Per Day/15 Days ary Discharge Instructions: Apply nickel thick amount to wound bed as instructed Prim Dressing: Saline moistened gauze 1 x Per Day/15 Days ary Discharge Instructions: Apply saline moistened gauze over Santyl Secondary Dressing: Woven Gauze Sponge, Non-Sterile 4x4 in (DME) (Generic) 1 x Per Day/15 Days Discharge Instructions: Apply over primary dressing as directed. Secondary Dressing: ComfortFoam Border, 6x6 in (silicone border) (DME) (Generic) 1  x Per KCM/03 Days Discharge Instructions: Apply over primary dressing as directed. 1. Would recommend at this time that we have the patient actually go ahead and initiate treatment with Santyl I think that is good to be the best way to go and he is in agreement with the plan. This will be used over the amputation site ulceration where he has significant eschar. 2. Also can recommend at this time that we have the patient continue to as much as possible keeping the swelling down. I am assuming he will continue with physical therapy on the outpatient basis now that he is at home I think that is appropriate. 3. With regard to the stroke that he had the good news is he got TPA an appropriate timeframe and he has no residual deficits from this that is excellent news I am very glad to hear  this. We will see patient back for reevaluation in 2 weeks here in the clinic. If anything worsens or changes patient will contact our office for additional recommendations. Electronic Signature(s) Signed: 12/19/2020 1:44:58 PM By: Worthy Keeler PA-C Entered By: Worthy Keeler on 12/19/2020 13:44:57 -------------------------------------------------------------------------------- HxROS Details Patient Name: Date of Service: Mike [name], Mike HN S. 12/19/2020 12:30 PM Medical Record Number: [id number] Patient Account Number: [id number] Date of Birth/Sex: Treating RN: [birth year] (59 y.o. Mike [name] Primary Care Provider: PA Haig Prophet, NO Other Clinician: Referring Provider: Treating Provider/Extender: Mardella Layman, Manish Weeks in Treatment: 3 Information Obtained From Patient Eyes Medical History: Negative for: Cataracts; Glaucoma; Optic Neuritis Ear/Nose/Mouth/Throat Medical History: Negative for: Chronic sinus problems/congestion; Middle ear problems Hematologic/Lymphatic Medical History: Negative for: Anemia; Hemophilia; Human Immunodeficiency Virus; Lymphedema; Sickle Cell Disease Respiratory Medical History: Positive for: Sleep Apnea - no CPAP Negative for: Aspiration; Asthma; Chronic Obstructive Pulmonary Disease (COPD); Pneumothorax Cardiovascular Medical History: Positive for: Coronary Artery Disease - x5 stents-x3 heart/x2 leg; Hypotension; Myocardial Infarction - 07/2020; Peripheral Arterial Disease Negative for: Angina; Arrhythmia; Congestive Heart Failure; Deep Vein Thrombosis; Hypertension; Peripheral Venous Disease; Phlebitis; Vasculitis Gastrointestinal Medical History: Negative for: Cirrhosis ; Colitis; Crohns; Hepatitis A; Hepatitis B; Hepatitis C Endocrine Medical History: Negative for: Type I Diabetes; Type II Diabetes Genitourinary Medical History: Negative for: End Stage Renal Disease Immunological Medical History: Negative for: Lupus  Erythematosus; Raynauds; Scleroderma Integumentary (Skin) Medical History: Negative for: History of Burn Musculoskeletal Medical History: Negative for: Gout; Rheumatoid Arthritis; Osteoarthritis; Osteomyelitis Past Medical History Notes: left AKA 11/9 Neurologic Medical History: Negative for: Dementia; Neuropathy; Quadriplegia; Paraplegia; Seizure Disorder Oncologic Medical History: Negative for: Received Chemotherapy; Received Radiation Psychiatric Medical History: Negative for: Anorexia/bulimia; Confinement Anxiety Immunizations Pneumococcal Vaccine: Received Pneumococcal Vaccination: No Implantable Devices None Hospitalization / Surgery History Type of Hospitalization/Surgery left AKA 119//2021 [place] 08/01/2020 CVA 12/11/20 Family and Social History Diabetes: Yes - Father; Heart Disease: Yes - Mother,Father,Maternal Grandparents,Paternal Grandparents; Hereditary Spherocytosis: No; Hypertension: Yes - Paternal Grandparents,Maternal Grandparents,Father,Mother; Kidney Disease: No; Lung Disease: Yes - Father,Mother; Seizures: No; Stroke: No; Thyroid Problems: No; Tuberculosis: No; Former smoker - quit 08/01/2020; Marital Status - Divorced; Alcohol Use: Never; Drug Use: No History; Caffeine Use: Daily - soda; Financial Concerns: No; Food, Clothing or Shelter Needs: No; Support System Lacking: No; Transportation Concerns: No Electronic Signature(s) Signed: 12/19/2020 5:13:59 PM By: Worthy Keeler PA-C Signed: 12/19/2020 7:00:57 PM By: [name], [name] Entered By: [name] on 12/19/2020 12:59:02 -------------------------------------------------------------------------------- [place] Details Patient Name: Date of Service: Mike [place] S. 12/19/2020 Medical Record Number: [id number] Patient Account Number: [id number] Date of Birth/Sex:  Treating RN: 1961/02/08 (59 y.o. Mike [name] Primary Care Provider: PA TIENT, NO Other Clinician: Referring  Provider: Treating Provider/Extender: Mardella Layman, Manish Weeks in Treatment: 3 Diagnosis Coding ICD-10 Codes Code Description T81.31XA Disruption of external operation (surgical) wound, not elsewhere classified, initial encounter L89.323 Pressure ulcer of left buttock, stage 3 I25.10 Atherosclerotic heart disease of native coronary artery without angina pectoris I73.89 Other specified peripheral vascular diseases I10 Essential (primary) hypertension Facility Procedures CPT4 Code: [id number] Description: [phone] - DEBRIDE W/O ANES NON SELECT Modifier: Quantity: 1 Physician Procedures : CPT4 Code Description Modifier [id number] - WC PHYS LEVEL 4 - EST PT ICD-10 Diagnosis Description T81.31XA Disruption of external operation (surgical) wound, not elsewhere classified, initial encounter L89.323 Pressure ulcer of left buttock, stage 3  I25.10 Atherosclerotic heart disease of native coronary artery without angina pectoris I73.89 Other specified peripheral vascular diseases Quantity: 1 Electronic Signature(s) Signed: 12/19/2020 1:45:13 PM By: Worthy Keeler PA-C Entered By: Worthy Keeler on 12/19/2020 13:45:13

## 2020-12-20 ENCOUNTER — Ambulatory Visit: Payer: BC Managed Care – PPO | Admitting: Cardiology

## 2020-12-20 ENCOUNTER — Inpatient Hospital Stay: Payer: BC Managed Care – PPO

## 2020-12-20 ENCOUNTER — Encounter: Payer: Self-pay | Admitting: Cardiology

## 2020-12-20 ENCOUNTER — Other Ambulatory Visit: Payer: Self-pay

## 2020-12-20 VITALS — BP 128/81 | HR 81 | Resp 16 | Ht 72.0 in | Wt 140.0 lb

## 2020-12-20 DIAGNOSIS — I739 Peripheral vascular disease, unspecified: Secondary | ICD-10-CM

## 2020-12-20 DIAGNOSIS — T8131XA Disruption of external operation (surgical) wound, not elsewhere classified, initial encounter: Secondary | ICD-10-CM | POA: Diagnosis not present

## 2020-12-20 DIAGNOSIS — N179 Acute kidney failure, unspecified: Secondary | ICD-10-CM | POA: Diagnosis not present

## 2020-12-20 DIAGNOSIS — Z720 Tobacco use: Secondary | ICD-10-CM | POA: Diagnosis not present

## 2020-12-20 DIAGNOSIS — I5020 Unspecified systolic (congestive) heart failure: Secondary | ICD-10-CM

## 2020-12-20 DIAGNOSIS — I6340 Cerebral infarction due to embolism of unspecified cerebral artery: Secondary | ICD-10-CM

## 2020-12-20 DIAGNOSIS — I340 Nonrheumatic mitral (valve) insufficiency: Secondary | ICD-10-CM | POA: Diagnosis not present

## 2020-12-20 DIAGNOSIS — I213 ST elevation (STEMI) myocardial infarction of unspecified site: Secondary | ICD-10-CM | POA: Diagnosis not present

## 2020-12-20 DIAGNOSIS — M6281 Muscle weakness (generalized): Secondary | ICD-10-CM | POA: Diagnosis not present

## 2020-12-20 DIAGNOSIS — I2510 Atherosclerotic heart disease of native coronary artery without angina pectoris: Secondary | ICD-10-CM

## 2020-12-20 DIAGNOSIS — R2689 Other abnormalities of gait and mobility: Secondary | ICD-10-CM | POA: Diagnosis not present

## 2020-12-20 DIAGNOSIS — I6309 Cerebral infarction due to thrombosis of other precerebral artery: Secondary | ICD-10-CM | POA: Diagnosis not present

## 2020-12-20 DIAGNOSIS — R2990 Unspecified symptoms and signs involving the nervous system: Secondary | ICD-10-CM | POA: Diagnosis not present

## 2020-12-20 DIAGNOSIS — E785 Hyperlipidemia, unspecified: Secondary | ICD-10-CM | POA: Diagnosis not present

## 2020-12-20 DIAGNOSIS — Z89612 Acquired absence of left leg above knee: Secondary | ICD-10-CM | POA: Diagnosis not present

## 2020-12-20 DIAGNOSIS — L84 Corns and callosities: Secondary | ICD-10-CM | POA: Diagnosis not present

## 2020-12-20 DIAGNOSIS — I10 Essential (primary) hypertension: Secondary | ICD-10-CM | POA: Diagnosis not present

## 2020-12-20 MED ORDER — RIVAROXABAN 2.5 MG PO TABS: 2.5000 mg | ORAL_TABLET | Freq: Two times a day (BID) | ORAL | 3 refills | Status: DC

## 2020-12-20 MED ORDER — METOPROLOL SUCCINATE ER 25 MG PO TB24
25.0000 mg | ORAL_TABLET | Freq: Every day | ORAL | 3 refills | Status: DC
Start: 2020-12-20 — End: 2021-12-24

## 2020-12-20 NOTE — Progress Notes (Addendum)
Follow up visit  Subjective:   Mike [name], male    DOB: 1961/09/12, 59 y.o.   MRN: [id number]     HPI   Chief Complaint  Patient presents with  . Hospitalization Follow-up    59 y.o.Caucasian male with CAD, culprit (RCA) and nonculprit (LCx) PCI after STEMI 07/2020, HFrEF, moderate MR, h/o cardiac arrest 2/2 Torsades post PCI during index hospitalization, PAD with critical limb ischemia s/p Rt iliac revascularization, and s/p Lt AKA amputation for critical limb ischemia,  severe bilateral renal artery stenoses w/h/o CIN (09/2020), stroke s/p tPA with resolution of neurodeficit (11/2020), multifocal stroke on MRI suspicious for cardioembolic source.  Patient underwent TEE which did show small PFO, but strongly positive bubble study out of proportion to small PFO/ possible secundum ASD. This led to CTA that showed large pulmonary AV fistula, details below.     Current Outpatient Medications on File Prior to Visit  Medication Sig Dispense Refill  . aspirin 81 MG EC tablet Take 1 tablet (81 mg total) by mouth daily. Swallow whole. 90 tablet 3  . clopidogrel (PLAVIX) 75 MG tablet Take 1 tablet (75 mg total) by mouth daily. 30 tablet 0  . furosemide (LASIX) 20 MG tablet Take 20 mg by mouth daily.    Marland Kitchen gabapentin (NEURONTIN) 300 MG capsule Take 1 capsule (300 mg total) by mouth 2 (two) times daily. (Patient taking differently: Take 300-600 mg by mouth See admin instructions. Take 1 capsule (300 mg totally) by mouth in the morning; take 2 capsules (600 mg totally) by mouth at bed time) 60 capsule 0  . nitroGLYCERIN (NITROSTAT) 0.4 MG SL tablet Place 1 tablet (0.4 mg total) under the tongue every 5 (five) minutes as needed for chest pain. (Patient taking differently: Place 0.4 mg under the tongue every 5 (five) minutes as needed for chest pain (max 3 doses).) 30 tablet 1  . potassium chloride (KLOR-CON) 10 MEQ tablet Take 10 mEq by mouth daily.    . rosuvastatin (CRESTOR) 10 MG tablet  Take 1 tablet (10 mg total) by mouth daily. 30 tablet 0   No current facility-administered medications on file prior to visit.    Cardiovascular & other pertient studies:  EKG 12/20/2020: Sinus rhythm 82 bpm Old inferior infarct Nonspecific ST depression   EKG 10/22/2020: Sinus tachycardia 103 bpm Old inferior infarct Persistent inferior ST elevations since MI in 07/2020 may represent aneurysmal changes.  PV intervention 10/16/2020: Successful intravascular lithotripsy, PTCA and stenting 7.0X39 mm balloon expandable Viabahn VBX stent Rt common iliac artery  7.0X60 mm self expanding Absolute Pro stent Rt distal iliac artery  No named vessels at ankle level in both lower extremities.  Will consult vascular surgery for Rt fem-to-left profunda bypass given left foot critical limb ischemia.  Abdominal Aortic Duplex 09/21/2020:  Moderate plaque noted in the proximal, mid and distal aorta. There is an  ulcerated plaque noted in the distal abdominal aorta. No AAA. Normal iliac  artery velocity.   Abdominal Aortic Duplex 09/21/2020:  Moderate plaque noted in the proximal, mid and distal aorta. There is an  ulcerated plaque noted in the distal abdominal aorta. No AAA. Normal iliac  artery velocity.  Lower Extremity Arterial Duplex 09/21/2020:  The right SFA is occluded in the proximal segment with reconstitution at  the level of the popliteal artery with diffuse monophasic waveform below  the knee. Right profunda femoral artery has >50% stenosis. There is  moderate mixed plaque noted throughout the right lower extremity.  Monophasic waveform throughout the left lower extremity, indicates  significant proximal disease (iliac artery).   Left SFA is occluded in the proximal segment and reconstitutes just above popliteal artery and diffuse dampened monophasic waveform throughout the lower extremity below the  knee. There is moderate mixed plaque throughout the left lower  extremity.   This exam reveals severely decreased perfusion of the right lower  extremity, noted at the dorsalis pedis artery level (ABI 0.42) and  critically decreased perfusion of the left lower extremity, noted at the  dorsalis pedis and post tibial artery level (ABI 0.03).   Echocardiogram 09/11/2020:  Left ventricle cavity is moderately dilated. Moderate concentric  hypertrophy of the left ventricle. Moderate global and severe  inferolateral hypokinesis. LVEF 15-20%. Grade 1 diastolic dysfunction.  Normal left atrial pressure. Calculated EF 15%.  Mild to moderate mitral regurgitation.  Inadequate TR jet to estimate pulmonary artery systolic pressure. Normal  right atrial pressure.  Compared to previous study in 03/2020, LVEF is reduced from 30-35%. Mitral  regurgitation is marginally improved.   EKG 08/14/2020: Sinus rhythm 81 bpm Old inferior and anterior infarct  Coronary intervention 08/03/2020: LM: Distal 40% stenosis LAD: Mid 30% disease LCx: Subtotally occluded OM2, prox LCx 70% stenosis Successful percutaneous coronary intervention OM2-Prox LCx PTCA and overlapping stents placement  2.5 X 38 mm and 2.5 X 18 mm Resolute Onyx drug-eluting stents 100%--->0% stenosis. TIMI flow 0-->III Small caliber distal vessel with moderate diffuse disease RCA: Prox 30% stenosis. Patent mid RCA sttent 2.5 X 26 mm Resolute Onyx drug-eluting stent  LVEDP 42 mmHg  Echocardiogram 08/02/2020: 1. Moderate global and severe inferolateral hypokinesis. Left ventricular  ejection fraction, by estimation, is 30 to 35%. The left ventricle has  normal function. The left ventricle demonstrates regional wall motion  abnormalities (see scoring  diagram/findings for description). There is mild left ventricular  hypertrophy. Left ventricular diastolic parameters are consistent with  Grade II diastolic dysfunction (pseudonormalization).  2. Right  ventricular systolic function is low normal. The right  ventricular size is normal.  3. Left atrial size was mildly dilated.  4. The mitral valve is grossly normal. Moderate mitral valve  regurgitation.  5. The aortic valve is tricuspid. Aortic valve regurgitation is not  visualized. Mild aortic valve sclerosis is present, with no evidence of  aortic valve stenosis.  6. The inferior vena cava is dilated in size with >50% respiratory  variability, suggesting right atrial pressure of 8 mmHg.   Coronary intervention 08/01/2020: LM: Distal 30% stenosis LAD: Mid 30% disease LCx: Subtotally occluded OM2, bridging left-to-left and right-to-left collaterals RCA: Prox 30% stenosis. Mid 100% occlusion Successful percutaneous coronary intervention mid RCA PTCA and stent placement 2.5 X 26 mm Resolute Onyx drug-eluting stent 100%--->0% stenosis. TIMI flow 0-->III   Recent labs: 12/14/2020: Glucose 100, [name]/Cr 13/1.09. EGFR >60. Na/K 140/3.9.  H/H 11.6/35.5. MCV 90.6. Platelets 202 HbA1C 4.6% Chol 130, TG 71, HDL 42, LDL 74  09/25/2020: Glucose 102, [name]/Cr 18/1.16. EGFR 68. Na/K 140/4.5.  NT pro BNP 2404  09/11/2020: Glucose 114, [name]/Cr 19/1.42. EGFR 53. Na/K 136/4.7.  Chol 95, TG 76, HDL 30, LDL 49  08/04/2020: Glucose 101, [name]/Cr 19/1.08. EGFR >60. Na/K 139/4.0. Rest of the CMP normal H/H 14/42. MCV 89. Platelets 184. WBC count 14k HbA1C 5.8% Chol 178, TG 75, HDL 40, LDL 123    Review of Systems  Cardiovascular: Positive for claudication and dyspnea on exertion. Negative for chest pain, leg swelling, palpitations and syncope.  Respiratory: Positive for shortness  of breath.          Vitals:   12/20/20 0904  BP: 128/81  Pulse: 81  Resp: 16  SpO2: 96%     Body mass index is 18.99 kg/m. Filed Weights   12/20/20 0904  Weight: 140 lb (63.5 kg)     Objective:   Physical Exam Vitals and nursing note reviewed.  Constitutional:      General:  He is not in acute distress. Neck:     Vascular: No JVD.  Cardiovascular:     Rate and Rhythm: Normal rate and regular rhythm.     Pulses:          Femoral pulses are 1+ on the right side and 1+ on the left side.      Dorsalis pedis pulses are 1+ on the right side and 1+ on the left side.       Posterior tibial pulses are 0 on the right side and 0 on the left side.     Heart sounds: Normal heart sounds. No murmur heard.     Comments: Lt AKA Pulmonary:     Effort: Pulmonary effort is normal.     Breath sounds: Normal breath sounds. No wheezing or rales.  Musculoskeletal:     Right lower leg: Edema (Trace) present.           Assessment & Recommendations:   59 y.o.Caucasian male with CAD, culprit (RCA) and nonculprit (LCx) PCI after STEMI 07/2020, HFrEF, moderate MR, h/o cardiac arrest 2/2 Torsades post PCI during index hospitalization, PAD with critical limb ischemia s/p Rt iliac revascularization, and s/p Lt AKA amputation for critical limb ischemia,  severe bilateral renal artery stenoses w/h/o CIN (09/2020), stroke s/p tPA with resolution of neurodeficit (11/2020), multifocal stroke on MRI suspicious for cardioembolic source, small PFO/secundum ASD and large pulmonary AV fistula  Stroke: Multifocal stroke with small PFO/secundum ASD but large pulmonary AV fistula I will refer her to interventional radiologist Dr. Kathlene Cote for consideration for closure. After closure, we could perform TCD or repeat TEE or TTE with bubble study to evaluate any residual shunting.   PAD: Successful revascularization of right common iliac and right external iliac artery (09/2020) S/p Lt AKA.  No critical limb ischemia, although he has pre-ulcerative calloses on right sole.   HFrEF: Euvolumic. EF stable at 30-35%. Will re-introduce low dose beta blocker metoprolol succinate 25 mg daily. He has had sinus pauses at night, but no asymptomatic pauses during daytime. Will hold Entresto given h/o CIN,  AKI with bilateral renal artery stenoses. Will recheck echocardiogram in 02/2021. If remains <35%, will consider ICD referral.  CAD: S/p STEMI 07/2020. Currently no angina symptoms Conitnue Aspirin/plavix, Crestor. Given severe CAD, PAD, added Xarelto 2.5 mg bid (COMPASS trial). Will give samples Increase Crestor 20 mg  F/u in 4 weeks  Time spent: 45 min   Nigel Mormon, MD Pager: [phone] Office: [phone]

## 2020-12-24 ENCOUNTER — Telehealth: Payer: Self-pay

## 2020-12-24 DIAGNOSIS — I6309 Cerebral infarction due to thrombosis of other precerebral artery: Secondary | ICD-10-CM | POA: Diagnosis not present

## 2020-12-24 DIAGNOSIS — M6281 Muscle weakness (generalized): Secondary | ICD-10-CM | POA: Diagnosis not present

## 2020-12-24 DIAGNOSIS — N179 Acute kidney failure, unspecified: Secondary | ICD-10-CM | POA: Diagnosis not present

## 2020-12-24 DIAGNOSIS — R2990 Unspecified symptoms and signs involving the nervous system: Secondary | ICD-10-CM | POA: Diagnosis not present

## 2020-12-24 DIAGNOSIS — Z89612 Acquired absence of left leg above knee: Secondary | ICD-10-CM | POA: Diagnosis not present

## 2020-12-24 DIAGNOSIS — Z720 Tobacco use: Secondary | ICD-10-CM | POA: Diagnosis not present

## 2020-12-24 DIAGNOSIS — E785 Hyperlipidemia, unspecified: Secondary | ICD-10-CM | POA: Diagnosis not present

## 2020-12-24 DIAGNOSIS — I340 Nonrheumatic mitral (valve) insufficiency: Secondary | ICD-10-CM | POA: Diagnosis not present

## 2020-12-24 DIAGNOSIS — R2689 Other abnormalities of gait and mobility: Secondary | ICD-10-CM | POA: Diagnosis not present

## 2020-12-24 DIAGNOSIS — I10 Essential (primary) hypertension: Secondary | ICD-10-CM | POA: Diagnosis not present

## 2020-12-24 DIAGNOSIS — I213 ST elevation (STEMI) myocardial infarction of unspecified site: Secondary | ICD-10-CM | POA: Diagnosis not present

## 2020-12-24 DIAGNOSIS — I739 Peripheral vascular disease, unspecified: Secondary | ICD-10-CM | POA: Diagnosis not present

## 2020-12-24 DIAGNOSIS — I5020 Unspecified systolic (congestive) heart failure: Secondary | ICD-10-CM | POA: Diagnosis not present

## 2020-12-24 NOTE — Telephone Encounter (Signed)
Spoke to patient regarding his Xarelto samples pt is either going to pick up today or tomorrow

## 2020-12-25 ENCOUNTER — Other Ambulatory Visit: Payer: Self-pay | Admitting: *Deleted

## 2020-12-25 NOTE — Patient Outreach (Signed)
[facility]) Care Management  12/25/2020  Mike [name] 02-28-1961 [id number]   Call placed to member to follow up on start of PT services and stroke recovery.  State he does not have any lingering effects from the stroke, denies headache or weakness.  Confirms that PT has started, twice a week, still has not received DME (shower bench) but expecting it to be delivered this week.  He was seen in the last week by cardiology and wound care.  Sacral wound has healed but he has a area of concern on his left stump.  Will start using Santyl for daily dressing changes and follow up with wound care for possible debridement on 1/5.  Cardiology office visit reportedly went well, had some medication changes.  He was taken off Entresto and placed on Metoprolol.  He was also placed on Xarelto and Crestor dose was increased.  He has to pick up the Xarelto samples from the office but report compliance with all other changes.  Does not monitor blood pressure daily but encouraged to do so.  He does not have machine, will have one mailed to the home.  Denies any urgent concerns, encouraged to contact this care manager with questions.  Agrees to follow up within the next 2 weeks.  Will plan to close case at that time.  [name], [place], MSN [place] [phone]

## 2020-12-26 DIAGNOSIS — R8271 Bacteriuria: Secondary | ICD-10-CM | POA: Diagnosis not present

## 2020-12-26 DIAGNOSIS — R350 Frequency of micturition: Secondary | ICD-10-CM | POA: Diagnosis not present

## 2020-12-26 DIAGNOSIS — R3912 Poor urinary stream: Secondary | ICD-10-CM | POA: Diagnosis not present

## 2020-12-31 ENCOUNTER — Other Ambulatory Visit: Payer: Self-pay | Admitting: *Deleted

## 2020-12-31 NOTE — Patient Outreach (Signed)
[place] [facility]) Care Management  12/31/2020  Mike [name] 1961-12-09 [id number]   RED ON EMMI ALERT - Stroke Day # 13 Date: 12/29/2020 Red Alert Reason: Problems refilling medications   Outreach attempt #1, successful.  Member report not being able to refill his Gabapentin due to pharmacy being out of stock.  He has not inquired about getting medication from other pharmacy or getting a different dose (example: he is taking 300 mg twice a day, potentially could take 3-100mg  tablets) until his prescribed dose is in stock.  Call placed to [facility] to inquire about refill.  Per pharmacy, medication wasn't available due to insurance coverage.  After further review, they are able to refill medication, will cost $2.72.  Member made aware and will go pick it up.     Plan: RN CM will follow up with member within the next week as planned, encouraged to contact this care manager with questions.  Mike [name], [place], MSN [place] [phone]

## 2021-01-02 ENCOUNTER — Encounter (HOSPITAL_BASED_OUTPATIENT_CLINIC_OR_DEPARTMENT_OTHER): Payer: BC Managed Care – PPO | Admitting: Physician Assistant

## 2021-01-07 ENCOUNTER — Ambulatory Visit: Payer: BC Managed Care – PPO | Admitting: Cardiology

## 2021-01-08 ENCOUNTER — Other Ambulatory Visit: Payer: Self-pay

## 2021-01-08 MED ORDER — CLOPIDOGREL BISULFATE 75 MG PO TABS: 75.0000 mg | ORAL_TABLET | Freq: Every day | ORAL | 0 refills | Status: DC

## 2021-01-08 MED ORDER — ROSUVASTATIN CALCIUM 10 MG PO TABS: 10.0000 mg | ORAL_TABLET | Freq: Every day | ORAL | 0 refills | Status: DC

## 2021-01-08 MED ORDER — ASPIRIN EC 81 MG PO TBEC: 81.0000 mg | DELAYED_RELEASE_TABLET | Freq: Every day | ORAL | 0 refills | Status: DC

## 2021-01-08 MED ORDER — POTASSIUM CHLORIDE ER 10 MEQ PO TBCR: 10.0000 meq | EXTENDED_RELEASE_TABLET | Freq: Every day | ORAL | 0 refills | Status: DC

## 2021-01-08 MED ORDER — FUROSEMIDE 20 MG PO TABS: 20.0000 mg | ORAL_TABLET | Freq: Every day | ORAL | 0 refills | Status: DC

## 2021-01-09 ENCOUNTER — Encounter (HOSPITAL_BASED_OUTPATIENT_CLINIC_OR_DEPARTMENT_OTHER): Payer: BC Managed Care – PPO | Attending: Physician Assistant | Admitting: Physician Assistant

## 2021-01-09 ENCOUNTER — Other Ambulatory Visit: Payer: Self-pay

## 2021-01-09 ENCOUNTER — Other Ambulatory Visit: Payer: Self-pay | Admitting: *Deleted

## 2021-01-09 DIAGNOSIS — I255 Ischemic cardiomyopathy: Secondary | ICD-10-CM | POA: Diagnosis not present

## 2021-01-09 DIAGNOSIS — I2510 Atherosclerotic heart disease of native coronary artery without angina pectoris: Secondary | ICD-10-CM | POA: Diagnosis not present

## 2021-01-09 DIAGNOSIS — I252 Old myocardial infarction: Secondary | ICD-10-CM | POA: Diagnosis not present

## 2021-01-09 DIAGNOSIS — Z89612 Acquired absence of left leg above knee: Secondary | ICD-10-CM | POA: Diagnosis not present

## 2021-01-09 DIAGNOSIS — I110 Hypertensive heart disease with heart failure: Secondary | ICD-10-CM | POA: Diagnosis not present

## 2021-01-09 DIAGNOSIS — Z8674 Personal history of sudden cardiac arrest: Secondary | ICD-10-CM | POA: Insufficient documentation

## 2021-01-09 DIAGNOSIS — I5022 Chronic systolic (congestive) heart failure: Secondary | ICD-10-CM | POA: Diagnosis not present

## 2021-01-09 DIAGNOSIS — I10 Essential (primary) hypertension: Secondary | ICD-10-CM | POA: Diagnosis not present

## 2021-01-09 DIAGNOSIS — T8781 Dehiscence of amputation stump: Secondary | ICD-10-CM | POA: Insufficient documentation

## 2021-01-09 DIAGNOSIS — I7389 Other specified peripheral vascular diseases: Secondary | ICD-10-CM | POA: Diagnosis not present

## 2021-01-09 DIAGNOSIS — Y835 Amputation of limb(s) as the cause of abnormal reaction of the patient, or of later complication, without mention of misadventure at the time of the procedure: Secondary | ICD-10-CM | POA: Diagnosis not present

## 2021-01-09 DIAGNOSIS — T8131XA Disruption of external operation (surgical) wound, not elsewhere classified, initial encounter: Secondary | ICD-10-CM | POA: Diagnosis not present

## 2021-01-09 NOTE — Patient Outreach (Signed)
[place]) Care Management  01/09/2021  Mike [name] 01-05-1961 [id number]   Outgoing call placed to member to follow up on ongoing stroke recovery.  State he is doing well, denies any lingering deficits.  Has follow ups with cardiology on 1/27, with neurologist on 2/22, and with new PCP also in February.  Denies any further needs, will close case at this time.  He will contact this care manager if he has any questions.  [name], [place], MSN [place] [phone]

## 2021-01-09 NOTE — Progress Notes (Addendum)
Mike [name] ([id number]) Visit Report for 01/09/2021 Chief Complaint Document Details Patient Name: Date of Service: Mike [name]. 01/09/2021 12:30 PM Medical Record Number: [id number] Patient Account Number: [id number] Date of Birth/Sex: Treating RN: 1961-02-07 (60 y.o. [name] Primary Care Provider: PA Haig Prophet, [place] Other Clinician: Referring Provider: Treating Provider/Extender: Mardella Layman, Manish Weeks in Treatment: 6 Information Obtained from: Patient Chief Complaint Left gluteal pressure ulcer Electronic Signature(s) Signed: 01/09/2021 12:54:15 PM By: Worthy Keeler PA-C Entered By: Worthy Keeler on 01/09/2021 12:54:14 -------------------------------------------------------------------------------- Debridement Details Patient Name: Date of Service: Mike Dike HN S. 01/09/2021 12:30 PM Medical Record Number: [id number] Patient Account Number: [id number] Date of Birth/Sex: Treating RN: Aug 29, 1961 (60 y.o. [name] Primary Care Provider: PA Haig Prophet, [place] Other Clinician: Referring Provider: Treating Provider/Extender: Mardella Layman, Manish Weeks in Treatment: 6 Debridement Performed for Assessment: Wound #3 Left,Medial Amputation Site - Above Knee Performed By: Physician Worthy Keeler, PA Debridement Type: Debridement Level of Consciousness (Pre-procedure): Awake and Alert Pre-procedure Verification/Time Out Yes - 12:55 Taken: Start Time: 12:56 Pain Control: Other : benzocaine 20% spray T Area Debrided (L x W): otal 4.8 (cm) x 2.4 (cm) = 11.52 (cm) Tissue and other material debrided: Viable, Non-Viable, Slough, Subcutaneous, Slough Level: Skin/Subcutaneous Tissue Debridement Description: Excisional Instrument: Curette Bleeding: Minimum Hemostasis Achieved: Pressure Procedural Pain: 4 Post Procedural Pain: 1 Response to Treatment: Procedure was tolerated well Level of Consciousness (Post- Awake and  Alert procedure): Post Debridement Measurements of Total Wound Length: (cm) 4.8 Width: (cm) 2.4 Depth: (cm) 0.8 Volume: (cm) 7.238 Character of Wound/Ulcer Post Debridement: Requires Further Debridement Post Procedure Diagnosis Same as Pre-procedure Electronic Signature(s) Signed: 01/09/2021 5:15:08 PM By: Worthy Keeler PA-C Signed: 01/09/2021 5:15:42 PM By: Baruch Gouty RN, [name] Entered By: Baruch Gouty on 01/09/2021 12:58:53 -------------------------------------------------------------------------------- HPI Details Patient Name: Date of Service: Mike Dike HN S. 01/09/2021 12:30 PM Medical Record Number: [id number] Patient Account Number: [id number] Date of Birth/Sex: Treating RN: [birth year] (60 y.o. [name] Primary Care Provider: PA Haig Prophet, [place] Other Clinician: Referring Provider: Treating Provider/Extender: Mardella Layman, Manish Weeks in Treatment: 6 History of Present Illness HPI Description: 11/28/2020 patient presents for initial evaluation here in our clinic concerning issues that he has been having with wounds actually in the left gluteal region. These occurred following his surgery and he actually had surgery which included a left above-knee amputation. Unfortunately this was necessitated by the fact that the patient had a fairly complicated recent vascular and cardiac history including a STEMI on 08/17/2020. This was complicated by cardiac arrest secondary to chronic ST elevations in the inferior leads, severe bilateral renal artery stenosis, peripheral arterial disease with critical limb ischemia requiring right iliac revascularization of the left lower extremity, chronic congestive heart failure systolic with ischemic cardiomyopathy with complaints of shortness of breath, decreased ability to walk due to severe leg pain and again the patient had acute critical limb ischemia. He was too high of a risk being that he was on aspirin and Brilinta  due to the recent STEMI and therefore was not a candidate for revascularization. Because he was decompensating so quickly it was elected to do an above-knee amputation to treat the progressing gangrene prior to this worsening significantly. This was done up by Dr. Donnetta Hutching on November 06, 2020. He did have a Foley catheter during the time that he was in the hospital. He was there from October 30, 2020 through November 15, 2020. It was during this time that he developed the wounds in the gluteal region on the left. The wounds appear to be in somewhat of a linear pattern that makes me in the suspect that he may have developed these as a result of having sat on a tubing such as a catheter tubing or something of the sort for too long causing a pressure injury although I cannot know that for sure. Not this far out. Currently the patient is on aspirin and Plavix and is currently in skilled nursing at [facility]. 12/19/2020 upon evaluation today patient appears to be doing well with regard to his gluteal wounds are completely healed. That is great news. The main issue is actually with his above-knee amputation stump on the left. He unfortunately is having trouble here with getting this to heal part of the medial aspect is eschar covered and I think this is good have to clear away in order to see this improve. I do believe however that we can initiate Santyl to try to help loosen this up in the future we should be able to debride this away much more effectively and quickly. 01/09/2021 upon evaluation today patient appears to be doing well with regard to his wound currently. He actually appears to be doing quite well and I do believe the Annitta Needs has been beneficial currently. There is much looser necrotic tissue noted in the wound bed we are going to need to clean this out I do think a wound VAC may likely be ideal for him in the situation Electronic Signature(s) Signed: 01/09/2021 1:08:01 PM By: Worthy Keeler  PA-C Entered By: Worthy Keeler on 01/09/2021 13:08:00 -------------------------------------------------------------------------------- Physical Exam Details Patient Name: Date of Service: Mike Dike HN S. 01/09/2021 12:30 PM Medical Record Number: [id number] Patient Account Number: [id number] Date of Birth/Sex: Treating RN: [birth year] (60 y.o. [name] Primary Care Provider: PA Haig Prophet, [place] Other Clinician: Referring Provider: Treating Provider/Extender: Mardella Layman, Manish Weeks in Treatment: 6 Constitutional Well-nourished and well-hydrated in no acute distress. Respiratory normal breathing without difficulty. Psychiatric this patient is able to make decisions and demonstrates good insight into disease process. Alert and Oriented x 3. pleasant and cooperative. Notes Upon inspection patient's wound bed actually showed signs of some necrotic tissue I did perform sharp debridement cleared away some of the dead tissue today he tolerated that without complication and postdebridement the wound bed actually appears to be doing much better which is great news. With that being said I think the Santyl continue for another week is ideal and then following that time I really would like to see about getting him started on a wound VAC as soon as possible. I think that is good to be the best thing to try to help fill this in as quickly as possible. Electronic Signature(s) Signed: 01/09/2021 1:08:32 PM By: Worthy Keeler PA-C Entered By: Worthy Keeler on 01/09/2021 13:08:32 -------------------------------------------------------------------------------- Physician Orders Details Patient Name: Date of Service: Mike Dike HN S. 01/09/2021 12:30 PM Medical Record Number: [id number] Patient Account Number: [id number] Date of Birth/Sex: Treating RN: 09-30-1961 (60 y.o. [name] Primary Care Provider: PA Haig Prophet, [place] Other Clinician: Referring Provider: Treating  Provider/Extender: Mardella Layman, Manish Weeks in Treatment: 6 Verbal / Phone Orders: No Diagnosis Coding ICD-10 Coding Code Description T81.31XA Disruption of external operation (surgical) wound, not elsewhere classified, initial encounter L89.323 Pressure ulcer of left buttock, stage 3 I25.10 Atherosclerotic heart disease of native coronary  artery without angina pectoris I73.89 Other specified peripheral vascular diseases I10 Essential (primary) hypertension Follow-up Appointments Return Appointment in 1 week. Bathing/ Shower/ Hygiene May shower and wash wound with soap and water. Negative Presssure Wound Therapy Wound Vac to wound continuously at 149m/hg pressure - to be placed by home health when available (wound center to order) [place] May utilize formulary equivalent dressing for wound treatment orders unless otherwise specified. - A [facility] for wound VAC changes 3 times per week (pt is active with [facility] PT) Dressing changes [place] / Wednesday / Friday [facility] Wound #3 - Amputation Site - Above Knee Wound Laterality: Left, Medial Cleanser: Soap and Water 1 x Per DSWF/09Days Discharge Instructions: May shower and wash wound with dial antibacterial soap and water prior to dressing change. Cleanser: Byram Ancillary Kit - 15 Day Supply (Generic) 1 x Per Day/15 Days Discharge Instructions: Use supplies as instructed; Kit contains: (15) Saline Bullets; (15) 3x3 Gauze; 15 pr Gloves Prim Dressing: Santyl Ointment 1 x Per Day/15 Days ary Discharge Instructions: Apply nickel thick amount to wound bed as instructed Prim Dressing: Saline moistened gauze 1 x Per Day/15 Days ary Discharge Instructions: Apply saline moistened gauze over Santyl Secondary Dressing: Woven Gauze Sponge, Non-Sterile 4x4 in (Generic) 1 x Per  Day/15 Days Discharge Instructions: Apply over primary dressing moistened with gauze over saline Secondary Dressing: ComfortFoam Border, 6x6 in (silicone border) 1 x Per Day/15 Days Discharge Instructions: Apply over primary dressing as directed. Secondary Dressing: Bordered Gauze, 6x6 (in/in) (Generic) 1 x Per Day/15 Days Discharge Instructions: Apply over primary dressing as directed. Electronic Signature(s) Signed: 01/09/2021 5:15:08 PM By: SWorthy KeelerPA-C Signed: 01/09/2021 5:15:42 PM By: BBaruch GoutyRN, [name] Entered By: BBaruch Goutyon 01/09/2021 13:57:11 -------------------------------------------------------------------------------- Problem List Details Patient Name: Date of Service: SLynnell DikeHN S. 01/09/2021 12:30 PM Medical Record Number: [id number] Account Number: [id number] of Birth/Sex: Treating RN: 810-11-1961(60y.o. MErnestene MentionPrimary Care Provider: PA THaig Prophet [place] Clinician: Referring Provider: Treating Provider/Extender: SMardella Layman Manish Weeks in Treatment: 6 Active Problems ICD-10 Encounter Code Description Active Date MDM Diagnosis T81.31XA Disruption of external operation (surgical) wound, not elsewhere classified, 12/19/2020 No Yes initial encounter L89.323 Pressure ulcer of left buttock, stage 3 11/28/2020 No Yes I25.10 Atherosclerotic heart disease of native coronary artery without angina pectoris 11/28/2020 No Yes I73.89 Other specified peripheral vascular diseases 11/28/2020 No Yes I10 Essential (primary) hypertension 11/28/2020 No Yes Inactive Problems Resolved Problems Electronic Signature(s) Signed: 01/09/2021 12:54:08 PM By: SWorthy KeelerPA-C Entered By: SWorthy Keeleron 01/09/2021 12:54:07 -------------------------------------------------------------------------------- Progress Note Details Patient Name: Date of Service: SLynnell DikeHN S. 01/09/2021 12:30 PM Medical Record Number:  [id number] Account Number: [id number] of Birth/Sex: Treating RN: 8March 25, 1962(60y.o. MErnestene MentionPrimary Care Provider: PA THaig Prophet NO Other Clinician: Referring Provider: Treating Provider/Extender: SMardella Layman Manish Weeks in Treatment: 6 Subjective Chief Complaint Information obtained from Patient Left gluteal pressure ulcer History of Present Illness (HPI) 11/28/2020 patient presents for initial evaluation here in our clinic concerning issues that he has been having with wounds actually in the left gluteal region. These occurred following his surgery and he actually had surgery which included a left above-knee amputation. Unfortunately this was necessitated by the fact that the patient had a  fairly complicated recent vascular and cardiac history including a STEMI on 08/17/2020. This was complicated by cardiac arrest secondary to chronic ST elevations in the inferior leads, severe bilateral renal artery stenosis, peripheral arterial disease with critical limb ischemia requiring right iliac revascularization of the left lower extremity, chronic congestive heart failure systolic with ischemic cardiomyopathy with complaints of shortness of breath, decreased ability to walk due to severe leg pain and again the patient had acute critical limb ischemia. He was too high of a risk being that he was on aspirin and Brilinta due to the recent STEMI and therefore was not a candidate for revascularization. Because he was decompensating so quickly it was elected to do an above-knee amputation to treat the progressing gangrene prior to this worsening significantly. This was done up by Dr. Donnetta Hutching on November 06, 2020. He did have a Foley catheter during the time that he was in the hospital. He was there from October 30, 2020 through November 15, 2020. It was during this time that he developed the wounds in the gluteal region on the left. The wounds appear to be in somewhat of a  linear pattern that makes me in the suspect that he may have developed these as a result of having sat on a tubing such as a catheter tubing or something of the sort for too long causing a pressure injury although I cannot know that for sure. Not this far out. Currently the patient is on aspirin and Plavix and is currently in skilled nursing at [facility]. 12/19/2020 upon evaluation today patient appears to be doing well with regard to his gluteal wounds are completely healed. That is great news. The main issue is actually with his above-knee amputation stump on the left. He unfortunately is having trouble here with getting this to heal part of the medial aspect is eschar covered and I think this is good have to clear away in order to see this improve. I do believe however that we can initiate Santyl to try to help loosen this up in the future we should be able to debride this away much more effectively and quickly. 01/09/2021 upon evaluation today patient appears to be doing well with regard to his wound currently. He actually appears to be doing quite well and I do believe the Annitta Needs has been beneficial currently. There is much looser necrotic tissue noted in the wound bed we are going to need to clean this out I do think a wound VAC may likely be ideal for him in the situation Objective Constitutional Well-nourished and well-hydrated in no acute distress. Vitals Time Taken: 12:39 PM, Height: 72 in, Weight: 135 lbs, BMI: 18.3, Temperature: 97.9 F, Pulse: 87 bpm, Respiratory Rate: 16 breaths/min, Blood Pressure: 149/88 mmHg. Respiratory normal breathing without difficulty. Psychiatric this patient is able to make decisions and demonstrates good insight into disease process. Alert and Oriented x 3. pleasant and cooperative. General Notes: Upon inspection patient's wound bed actually showed signs of some necrotic tissue I did perform sharp debridement cleared away some of the dead tissue  today he tolerated that without complication and postdebridement the wound bed actually appears to be doing much better which is great news. With that being said I think the Santyl continue for another week is ideal and then following that time I really would like to see about getting him started on a wound VAC as soon as possible. I think that is good to be the best thing to try to help  fill this in as quickly as possible. Integumentary (Hair, Skin) Wound #3 status is Open. Original cause of wound was Surgical Injury. The wound is located on the Left,Medial Amputation Site - Above Knee. The wound measures 4.8cm length x 2.4cm width x 0.8cm depth; 9.048cm^2 area and 7.238cm^3 volume. There is Fat Layer (Subcutaneous Tissue) exposed. There is no tunneling or undermining noted. There is a medium amount of serosanguineous drainage noted. The wound margin is well defined and not attached to the wound base. There is small (1-33%) pink granulation within the wound bed. There is a large (67-100%) amount of necrotic tissue within the wound bed including Adherent Slough. Assessment Active Problems ICD-10 Disruption of external operation (surgical) wound, not elsewhere classified, initial encounter Pressure ulcer of left buttock, stage 3 Atherosclerotic heart disease of native coronary artery without angina pectoris Other specified peripheral vascular diseases Essential (primary) hypertension Procedures Wound #3 Pre-procedure diagnosis of Wound #3 is a Dehisced Wound located on the Left,Medial Amputation Site - Above Knee . There was a Excisional Skin/Subcutaneous Tissue Debridement with a total area of 11.52 sq cm performed by Worthy Keeler, PA. With the following instrument(s): Curette to remove Viable and Non-Viable tissue/material. Material removed includes Subcutaneous Tissue and Slough and after achieving pain control using Other (benzocaine 20% spray). No specimens were taken. A time out was  conducted at 12:55, prior to the start of the procedure. A Minimum amount of bleeding was controlled with Pressure. The procedure was tolerated well with a pain level of 4 throughout and a pain level of 1 following the procedure. Post Debridement Measurements: 4.8cm length x 2.4cm width x 0.8cm depth; 7.238cm^3 volume. Character of Wound/Ulcer Post Debridement requires further debridement. Post procedure Diagnosis Wound #3: Same as Pre-Procedure Plan Follow-up Appointments: Return Appointment in 1 week. Bathing/ Shower/ Hygiene: May shower and wash wound with soap and water. Negative Presssure Wound Therapy: Wound Vac to wound continuously at 130m/hg pressure - to be placed by home health when available (wound center to order) [place] [facility]. May utilize formulary equivalent dressing for wound treatment orders unless otherwise specified. - [facility] for wound VAC changes 3 times per week (pt is active with [facility] PT) Dressing changes to be completed by [place] / Wednesday / Friday except when [facility] WOUND #3: - Amputation Site - Above Knee Wound Laterality: Left, Medial Cleanser: Soap and Water 1 x Per Day/15 Days Discharge Instructions: May shower and wash wound with dial antibacterial soap and water prior to dressing change. Cleanser: Byram Ancillary Kit - 15 Day Supply (Generic) 1 x Per Day/15 Days Discharge Instructions: Use supplies as instructed; Kit contains: (15) Saline Bullets; (15) 3x3 Gauze; 15 pr Gloves Prim Dressing: Santyl Ointment 1 x Per Day/15 Days ary Discharge Instructions: Apply nickel thick amount to wound bed as instructed Prim Dressing: Saline moistened gauze 1 x Per Day/15 Days ary Discharge Instructions: Apply saline moistened gauze over Santyl Secondary Dressing: Woven Gauze Sponge, Non-Sterile 4x4 in (Generic) 1 x Per Day/15 Days Discharge Instructions: Apply  over primary dressing moistened with gauze over saline Secondary Dressing: ComfortFoam Border, 6x6 in (silicone border) 1 x Per Day/15 Days Discharge Instructions: Apply over primary dressing as directed. Secondary Dressing: Bordered Gauze, 6x6 (in/in) (Generic) 1 x Per Day/15 Days Discharge Instructions: Apply over primary dressing as directed. 1. I am going to recommend currently that we continue with the Santyl and the  patient is in agreement with the plan. 2. I am also can recommend at this point that the patient continue to monitor for any evidence of infection. Right now I see nothing and I think that he is at an ideal place to see about initiating the wound VAC as soon as possible. 3. I would also recommend the patient continue to elevate his amputation site in order to prevent any excessive swelling again right now I do not see any evidence of significant swelling which is great news. We will see patient back for reevaluation in 1 week here in the clinic. If anything worsens or changes patient will contact our office for additional recommendations. Electronic Signature(s) Signed: 01/09/2021 5:15:08 PM By: Worthy Keeler PA-C Signed: 01/09/2021 5:15:42 PM By: Baruch Gouty RN, [name] Previous Signature: 01/09/2021 1:09:19 PM Version By: Worthy Keeler PA-C Entered By: Baruch Gouty on 01/09/2021 13:58:19 -------------------------------------------------------------------------------- SuperBill Details Patient Name: Date of Service: [name] 01/09/2021 Medical Record Number: [id number] Patient Account Number: [id number] Date of Birth/Sex: Treating RN: January 16, 1961 (60 y.o. [name] Primary Care Provider: PA Haig Prophet, [place] Other Clinician: Referring Provider: Treating Provider/Extender: Mardella Layman, Manish Weeks in Treatment: 6 Diagnosis Coding ICD-10 Codes Code Description T81.31XA Disruption of external operation (surgical) wound, not elsewhere  classified, initial encounter L89.323 Pressure ulcer of left buttock, stage 3 I25.10 Atherosclerotic heart disease of native coronary artery without angina pectoris I73.89 Other specified peripheral vascular diseases I10 Essential (primary) hypertension Facility Procedures CPT4 Code: [id number] Description: 16606 - DEB SUBQ TISSUE 20 SQ CM/< ICD-10 Diagnosis Description T81.31XA Disruption of external operation (surgical) wound, not elsewhere classified, init Modifier: ial encounter Quantity: 1 Physician Procedures : CPT4 Code Description Modifier [id number] - WC PHYS LEVEL 4 - EST PT 25 ICD-10 Diagnosis Description T81.31XA Disruption of external operation (surgical) wound, not elsewhere classified, initial encounter L89.323 Pressure ulcer of left buttock,  stage 3 I25.10 Atherosclerotic heart disease of native coronary artery without angina pectoris I73.89 Other specified peripheral vascular diseases Quantity: 1 : [id number] 11042 - WC PHYS SUBQ TISS 20 SQ CM ICD-10 Diagnosis Description T81.31XA Disruption of external operation (surgical) wound, not elsewhere classified, initial encounter Quantity: 1 Electronic Signature(s) Signed: 01/09/2021 1:09:36 PM By: Worthy Keeler PA-C Entered By: Worthy Keeler on 01/09/2021 13:09:35

## 2021-01-10 NOTE — Progress Notes (Signed)
OWENN, ROTHERMEL ([id number]) Visit Report for 01/09/2021 Arrival Information Details Patient Name: Date of Service: Mike [name]. 01/09/2021 12:30 PM Medical Record Number: [id number] Patient Account Number: [id number] Date of Birth/Sex: Treating RN: May 31, 1961 (60 y.o. Mike [name] Primary Care Kahlie Deutscher: PA TIENT, NO Other Clinician: Referring Kaymarie Wynn: Treating Tamarcus Condie/Extender: Mardella Layman, Manish Weeks in Treatment: 6 Visit Information History Since Last Visit Added or deleted any medications: No Patient Arrived: Ambulatory Any new allergies or adverse reactions: No Arrival Time: 12:36 Had a fall or experienced change in No Accompanied By: sister activities of daily living that may affect Transfer Assistance: None risk of falls: Patient Identification Verified: Yes Signs or symptoms of abuse/neglect since last visito No Secondary Verification Process Completed: Yes Hospitalized since last visit: No Patient Requires Transmission-Based Precautions: No Implantable device outside of the clinic excluding No Patient Has Alerts: Yes cellular tissue based products placed in the center Patient Alerts: Patient on Blood Thinner since last visit: Has Dressing in Place as Prescribed: Yes Pain Present Now: No Electronic Signature(s) Signed: 01/10/2021 5:28:50 PM By: [name], [name] Entered By: [name] on 01/09/2021 12:39:59 -------------------------------------------------------------------------------- Encounter Discharge Information Details Patient Name: Date of Service: Mike Dike HN S. 01/09/2021 12:30 PM Medical Record Number: [id number] Patient Account Number: [id number] Date of Birth/Sex: Treating RN: [birth year] (60 y.o. Mike [name] Primary Care Clela Hagadorn: PA Haig Prophet, NO Other Clinician: Referring Dajuan Turnley: Treating Tali Coster/Extender: Mardella Layman, Manish Weeks in Treatment: 6 Encounter Discharge Information Items  Post Procedure Vitals Discharge Condition: Stable Temperature (F): 97.9 Ambulatory Status: Wheelchair Pulse (bpm): 87 Discharge Destination: Home Respiratory Rate (breaths/min): 18 Transportation: Private Auto Blood Pressure (mmHg): 149/88 Accompanied By: family member Schedule Follow-up Appointment: Yes Clinical Summary of Care: Electronic Signature(s) Signed: 01/09/2021 4:35:23 PM By: Deon Pilling Entered By: Deon Pilling on 01/09/2021 13:13:12 -------------------------------------------------------------------------------- [place] Details Patient Name: Date of Service: Mike Dike HN S. 01/09/2021 12:30 PM Medical Record Number: [id number] Patient Account Number: [id number] Date of Birth/Sex: Treating RN: 1961/03/09 (60 y.o. Mike [name] Primary Care Elim Economou: PA Haig Prophet, NO Other Clinician: Referring Freda Jaquith: Treating Bonita Brindisi/Extender: Mardella Layman, Manish Weeks in Treatment: 6 Active Inactive Wound/Skin Impairment Nursing Diagnoses: Impaired tissue integrity Knowledge deficit related to ulceration/compromised skin integrity Goals: Patient/caregiver will verbalize understanding of skin care regimen Date Initiated: 11/28/2020 Target Resolution Date: 01/18/2021 Goal Status: Active Ulcer/skin breakdown will have a volume reduction of 30% by week 4 Date Initiated: 11/28/2020 Date Inactivated: 12/19/2020 Target Resolution Date: 12/26/2020 Goal Status: Met Interventions: Assess patient/caregiver ability to obtain necessary supplies Assess patient/caregiver ability to perform ulcer/skin care regimen upon admission and as needed Assess ulceration(s) every visit Provide education on ulcer and skin care Treatment Activities: Skin care regimen initiated : 11/28/2020 Topical wound management initiated : 11/28/2020 Notes: Electronic Signature(s) Signed: 01/09/2021 5:15:42 PM By: Baruch Gouty RN, [name] Entered By: Baruch Gouty on  01/09/2021 12:43:09 -------------------------------------------------------------------------------- Pain Assessment Details Patient Name: Date of Service: Mike Dike HN S. 01/09/2021 12:30 PM Medical Record Number: [id number] Patient Account Number: [id number] Date of Birth/Sex: Treating RN: [birth year] (60 y.o. Mike [name] Primary Care Lavender Stanke: PA Haig Prophet, NO Other Clinician: Referring Nancyann Cotterman: Treating Burr Soffer/Extender: Mardella Layman, Manish Weeks in Treatment: 6 Active Problems Location of Pain Severity and Description of Pain Patient Has Paino No Site Locations Pain Management and Medication Current Pain Management: Electronic Signature(s) Signed: 01/10/2021 5:28:50 PM By: [name], [name] Entered By: [name] on 01/09/2021  12:39:45 -------------------------------------------------------------------------------- Patient/Caregiver Education Details Patient Name: Date of Service: Mike [name] 1/12/2022andnbsp12:30 PM Medical Record Number: [id number] Patient Account Number: [id number] Date of Birth/Gender: Treating RN: 1961/02/27 (60 y.o. Mike [name] Primary Care Physician: PA Haig Prophet, NO Other Clinician: Referring Physician: Treating Physician/Extender: Mardella Layman, Manish Weeks in Treatment: 6 Education Assessment Education Provided To: Patient Education Topics Provided Wound/Skin Impairment: Methods: Explain/Verbal Responses: Reinforcements needed, State content correctly Electronic Signature(s) Signed: 01/09/2021 5:15:42 PM By: Baruch Gouty RN, [name] Entered By: Baruch Gouty on 01/09/2021 12:43:38 -------------------------------------------------------------------------------- Wound Assessment Details Patient Name: Date of Service: Mike Dike HN S. 01/09/2021 12:30 PM Medical Record Number: [id number] Patient Account Number: [id number] Date of Birth/Sex: Treating RN: Nov 16, 1961 (60 y.o. Mike [name] Primary Care Caliana Spires: PA TIENT, NO Other Clinician: Referring Tekoa Amon: Treating Lynnsie Linders/Extender: Mardella Layman, Manish Weeks in Treatment: 6 Wound Status Wound Number: 3 Primary Dehisced Wound Etiology: Wound Location: Left, Medial Amputation Site - Above Knee Wound Open Wounding Event: Surgical Injury Status: Date Acquired: 11/06/2020 Comorbid Sleep Apnea, Coronary Artery Disease, Hypotension, Myocardial Weeks Of Treatment: 3 History: Infarction, Peripheral Arterial Disease Clustered Wound: No Photos Photo Uploaded By: Mikeal Hawthorne on 01/10/2021 13:07:17 Wound Measurements Length: (cm) 4.8 Width: (cm) 2.4 Depth: (cm) 0.8 Area: (cm) 9.048 Volume: (cm) 7.238 % Reduction in Area: 20.5% % Reduction in Volume: 36.4% Epithelialization: Small (1-33%) Tunneling: No Undermining: No Wound Description Classification: Full Thickness Without Exposed Support Structures Wound Margin: Well defined, not attached Exudate Amount: Medium Exudate Type: Serosanguineous Exudate Color: red, brown Foul Odor After Cleansing: No Slough/Fibrino Yes Wound Bed Granulation Amount: Small (1-33%) Exposed Structure Granulation Quality: Pink Fascia Exposed: No Necrotic Amount: Large (67-100%) Fat Layer (Subcutaneous Tissue) Exposed: Yes Necrotic Quality: Adherent Slough Tendon Exposed: No Muscle Exposed: No Joint Exposed: No Bone Exposed: No Treatment Notes Wound #3 (Amputation Site - Above Knee) Wound Laterality: Left, Medial Cleanser Soap and Water Discharge Instruction: May shower and wash wound with dial antibacterial soap and water prior to dressing change. [place] Ancillary Kit - 15 Day Supply Discharge Instruction: Use supplies as instructed; Kit contains: (15) Saline Bullets; (15) 3x3 Gauze; 15 pr Gloves Peri-Wound Care Topical Primary Dressing Santyl Ointment Discharge Instruction: Apply nickel thick amount to wound bed as instructed Saline  moistened gauze Discharge Instruction: Apply saline moistened gauze over Santyl Secondary Dressing Woven Gauze Sponge, Non-Sterile 4x4 in Discharge Instruction: Apply over primary dressing moistened with gauze over saline ComfortFoam Border, 6x6 in (silicone border) Discharge Instruction: Apply over primary dressing as directed. Bordered Gauze, 6x6 (in/in) Discharge Instruction: Apply over primary dressing as directed. Secured With Compression Wrap Compression Stockings Environmental education officer) Signed: 01/10/2021 5:28:50 PM By: [name], [name] Entered By: [name] on 01/09/2021 12:49:23 -------------------------------------------------------------------------------- [place] Details Patient Name: Date of Service: [name], JO HN S. 01/09/2021 12:30 PM Medical Record Number: [id number] Patient Account Number: [id number] Date of Birth/Sex: Treating RN: 29-Sep-1961 (60 y.o. Mike [name] Primary Care Nyeli Holtmeyer: PA TIENT, NO Other Clinician: Referring Odetta Forness: Treating Georgette Helmer/Extender: Mardella Layman, Manish Weeks in Treatment: 6 Vital Signs Time Taken: 12:39 Temperature (F): 97.9 Height (in): 72 Pulse (bpm): 87 Weight (lbs): 135 Respiratory Rate (breaths/min): 16 Body Mass Index (BMI): 18.3 Blood Pressure (mmHg): 149/88 Reference Range: 80 - 120 mg / dl Electronic Signature(s) Signed: 01/10/2021 5:28:50 PM By: [name], [name] Entered By: [name] on 01/09/2021 12:39:25

## 2021-01-11 DIAGNOSIS — I6340 Cerebral infarction due to embolism of unspecified cerebral artery: Secondary | ICD-10-CM | POA: Diagnosis not present

## 2021-01-16 ENCOUNTER — Encounter (HOSPITAL_BASED_OUTPATIENT_CLINIC_OR_DEPARTMENT_OTHER): Payer: BC Managed Care – PPO | Admitting: Physician Assistant

## 2021-01-16 ENCOUNTER — Other Ambulatory Visit: Payer: Self-pay

## 2021-01-16 DIAGNOSIS — Z8674 Personal history of sudden cardiac arrest: Secondary | ICD-10-CM | POA: Diagnosis not present

## 2021-01-16 DIAGNOSIS — Y835 Amputation of limb(s) as the cause of abnormal reaction of the patient, or of later complication, without mention of misadventure at the time of the procedure: Secondary | ICD-10-CM | POA: Diagnosis not present

## 2021-01-16 DIAGNOSIS — I110 Hypertensive heart disease with heart failure: Secondary | ICD-10-CM | POA: Diagnosis not present

## 2021-01-16 DIAGNOSIS — I252 Old myocardial infarction: Secondary | ICD-10-CM | POA: Diagnosis not present

## 2021-01-16 DIAGNOSIS — I10 Essential (primary) hypertension: Secondary | ICD-10-CM | POA: Diagnosis not present

## 2021-01-16 DIAGNOSIS — I5022 Chronic systolic (congestive) heart failure: Secondary | ICD-10-CM | POA: Diagnosis not present

## 2021-01-16 DIAGNOSIS — Z89612 Acquired absence of left leg above knee: Secondary | ICD-10-CM | POA: Diagnosis not present

## 2021-01-16 DIAGNOSIS — I255 Ischemic cardiomyopathy: Secondary | ICD-10-CM | POA: Diagnosis not present

## 2021-01-16 DIAGNOSIS — I7389 Other specified peripheral vascular diseases: Secondary | ICD-10-CM | POA: Diagnosis not present

## 2021-01-16 DIAGNOSIS — I2510 Atherosclerotic heart disease of native coronary artery without angina pectoris: Secondary | ICD-10-CM | POA: Diagnosis not present

## 2021-01-16 DIAGNOSIS — T8781 Dehiscence of amputation stump: Secondary | ICD-10-CM | POA: Diagnosis not present

## 2021-01-16 DIAGNOSIS — T8131XA Disruption of external operation (surgical) wound, not elsewhere classified, initial encounter: Secondary | ICD-10-CM | POA: Diagnosis not present

## 2021-01-16 NOTE — Progress Notes (Addendum)
[name], Mike ([id number]) Visit Report for 01/16/2021 Chief Complaint Document Details Patient Name: Date of Service: Mike [name]. 01/16/2021 2:15 PM Medical Record Number: [id number] Patient Account Number: [id number] Date of Birth/Sex: Treating RN: 1961-11-05 (60 y.o. Mike [name] Primary Care Provider: PA Haig Prophet, [place] Other Clinician: Referring Provider: Treating Provider/Extender: Mardella Layman, Manish Weeks in Treatment: 7 Information Obtained from: Patient Chief Complaint Left gluteal pressure ulcer Electronic Signature(s) Signed: 01/16/2021 2:56:09 PM By: Worthy Keeler PA-C Entered By: Worthy Keeler on 01/16/2021 14:56:08 -------------------------------------------------------------------------------- Debridement Details Patient Name: Date of Service: Mike [name] HN S. 01/16/2021 2:15 PM Medical Record Number: [id number] Patient Account Number: [id number] Date of Birth/Sex: Treating RN: [birth year] (60 y.o. Mike [name] Primary Care Provider: PA Haig Prophet, [place] Other Clinician: Referring Provider: Treating Provider/Extender: Mardella Layman, Manish Weeks in Treatment: 7 Debridement Performed for Assessment: Wound #3 Left,Medial Amputation Site - Above Knee Performed By: Physician Worthy Keeler, PA Debridement Type: Debridement Level of Consciousness (Pre-procedure): Awake and Alert Pre-procedure Verification/Time Out Yes - 15:15 Taken: Start Time: 15:16 Pain Control: Lidocaine 5% topical ointment T Area Debrided (L x W): otal 4.1 (cm) x 2 (cm) = 8.2 (cm) Tissue and other material debrided: Viable, Non-Viable, Slough, Subcutaneous, Slough Level: Skin/Subcutaneous Tissue Debridement Description: Excisional Instrument: Curette Bleeding: Minimum Hemostasis Achieved: Pressure End Time: 15:20 Procedural Pain: 0 Post Procedural Pain: 0 Response to Treatment: Procedure was tolerated well Level of Consciousness (Post- Awake  and Alert procedure): Post Debridement Measurements of Total Wound Length: (cm) 4.1 Width: (cm) 2 Depth: (cm) 0.7 Volume: (cm) 4.508 Character of Wound/Ulcer Post Debridement: Requires Further Debridement Post Procedure Diagnosis Same as Pre-procedure Electronic Signature(s) Signed: 01/16/2021 5:51:30 PM By: Baruch Gouty RN, [name] Signed: 01/16/2021 6:05:28 PM By: Worthy Keeler PA-C Entered By: Baruch Gouty on 01/16/2021 15:21:12 -------------------------------------------------------------------------------- HPI Details Patient Name: Date of Service: Mike [name], JO HN S. 01/16/2021 2:15 PM Medical Record Number: [id number] Patient Account Number: [id number] Date of Birth/Sex: Treating RN: [birth year] (60 y.o. Mike [name] Primary Care Provider: PA Haig Prophet, [place] Other Clinician: Referring Provider: Treating Provider/Extender: Mardella Layman, Manish Weeks in Treatment: 7 History of Present Illness HPI Description: 11/28/2020 patient presents for initial evaluation here in our clinic concerning issues that he has been having with wounds actually in the left gluteal region. These occurred following his surgery and he actually had surgery which included a left above-knee amputation. Unfortunately this was necessitated by the fact that the patient had a fairly complicated recent vascular and cardiac history including a STEMI on 08/17/2020. This was complicated by cardiac arrest secondary to chronic ST elevations in the inferior leads, severe bilateral renal artery stenosis, peripheral arterial disease with critical limb ischemia requiring right iliac revascularization of the left lower extremity, chronic congestive heart failure systolic with ischemic cardiomyopathy with complaints of shortness of breath, decreased ability to walk due to severe leg pain and again the patient had acute critical limb ischemia. He was too high of a risk being that he was on aspirin and Brilinta  due to the recent STEMI and therefore was not a candidate for revascularization. Because he was decompensating so quickly it was elected to do an above-knee amputation to treat the progressing gangrene prior to this worsening significantly. This was done up by Dr. Donnetta Hutching on November 06, 2020. He did have a Foley catheter during the time that he was in the hospital. He was there from October 30, 2020 through  November 15, 2020. It was during this time that he developed the wounds in the gluteal region on the left. The wounds appear to be in somewhat of a linear pattern that makes me in the suspect that he may have developed these as a result of having sat on a tubing such as a catheter tubing or something of the sort for too long causing a pressure injury although I cannot know that for sure. Not this far out. Currently the patient is on aspirin and Plavix and is currently in skilled nursing at [facility]. 12/19/2020 upon evaluation today patient appears to be doing well with regard to his gluteal wounds are completely healed. That is great news. The main issue is actually with his above-knee amputation stump on the left. He unfortunately is having trouble here with getting this to heal part of the medial aspect is eschar covered and I think this is good have to clear away in order to see this improve. I do believe however that we can initiate Santyl to try to help loosen this up in the future we should be able to debride this away much more effectively and quickly. 01/09/2021 upon evaluation today patient appears to be doing well with regard to his wound currently. He actually appears to be doing quite well and I do believe the Annitta Needs has been beneficial currently. There is much looser necrotic tissue noted in the wound bed we are going to need to clean this out I do think a wound VAC may likely be ideal for him in the situation 01/16/2021 upon evaluation today patient appears to be doing well with  regard to his wound. In fact the slough seems to be loosening up quite significantly today and the Santyl has really been doing a good job. He has gotten approval for the wound VAC and that should be delivered hopefully tomorrow Electronic Signature(s) Signed: 01/16/2021 3:26:18 PM By: Worthy Keeler PA-C Entered By: Worthy Keeler on 01/16/2021 15:26:18 -------------------------------------------------------------------------------- Physical Exam Details Patient Name: Date of Service: [name] 01/16/2021 2:15 PM Medical Record Number: [id number] Patient Account Number: [id number] Date of Birth/Sex: Treating RN: 1961-03-17 (60 y.o. Mike [name] Primary Care Provider: PA Haig Prophet, [place] Other Clinician: Referring Provider: Treating Provider/Extender: Mardella Layman, Manish Weeks in Treatment: 7 Constitutional Well-nourished and well-hydrated in no acute distress. Respiratory normal breathing without difficulty. Psychiatric this patient is able to make decisions and demonstrates good insight into disease process. Alert and Oriented x 3. pleasant and cooperative. Notes Upon inspection patient's wound bed actually showed signs of good granulation at this time. There was some slough noted I did warm sharp debridement to remove some of the necrotic debris. He tolerated that without complication postdebridement the wound bed appears to be doing excellent and I am very pleased. No excessive bleeding was noted. I think he is definitely ready for the wound VAC today Electronic Signature(s) Signed: 01/16/2021 3:26:38 PM By: Worthy Keeler PA-C Entered By: Worthy Keeler on 01/16/2021 15:26:37 -------------------------------------------------------------------------------- Physician Orders Details Patient Name: Date of Service: Mike [name] HN S. 01/16/2021 2:15 PM Medical Record Number: [id number] Patient Account Number: [id number] Date of Birth/Sex: Treating  RN: [birth year] (60 y.o. Mike [name] Primary Care Provider: PA Haig Prophet, [place] Other Clinician: Referring Provider: Treating Provider/Extender: Mardella Layman, Manish Weeks in Treatment: 7 Verbal / Phone Orders: No Diagnosis Coding ICD-10 Coding Code Description T81.31XA Disruption of external operation (surgical) wound, not elsewhere classified, initial  encounter [phone] Pressure ulcer of left buttock, stage 3 I25.10 Atherosclerotic heart disease of native coronary artery without angina pectoris I73.89 Other specified peripheral vascular diseases I10 Essential (primary) hypertension Follow-up Appointments Return Appointment in 1 week. Bathing/ Shower/ Hygiene May shower and wash wound with soap and water. Negative Presssure Wound Therapy Wound Vac to wound continuously at 197mm/hg pressure - to be placed by home health when available ([facility] to deliver to pt's home 1/20) [place] No change in wound care orders this week; continue Home Health for wound care. May utilize formulary equivalent dressing for wound treatment orders unless otherwise specified. Dressing changes to be completed by [place] on Monday / Wednesday / Friday except when patient has scheduled visit at [facility]. Other Home Health Orders/Instructions: - Interim Wound Treatment Wound #3 - Amputation Site - Above Knee Wound Laterality: Left, Medial Cleanser: Soap and Water 1 x Per JXB/14 Days Discharge Instructions: May shower and wash wound with dial antibacterial soap and water prior to dressing change. Prim Dressing: Santyl Ointment 1 x Per Day/15 Days ary Discharge Instructions: Apply nickel thick amount to wound bed. Discontinue once VAC applied. Prim Dressing: Saline moistened gauze 1 x Per Day/15 Days ary Discharge Instructions: Apply saline moistened gauze over Santyl Secondary Dressing: Woven Gauze Sponge, Non-Sterile 4x4 in (Generic) 1 x Per Day/15 Days Discharge  Instructions: Apply over primary dressing moistened with gauze over saline Secondary Dressing: ComfortFoam Border, 6x6 in (silicone border) 1 x Per Day/15 Days Discharge Instructions: Apply over primary dressing as directed. Secondary Dressing: Bordered Gauze, 6x6 (in/in) (Generic) 1 x Per Day/15 Days Discharge Instructions: Apply over primary dressing as directed. Electronic Signature(s) Signed: 01/16/2021 5:51:30 PM By: Baruch Gouty RN, [name] Signed: 01/16/2021 6:05:28 PM By: Worthy Keeler PA-C Entered By: Baruch Gouty on 01/16/2021 15:24:47 -------------------------------------------------------------------------------- Problem List Details Patient Name: Date of Service: [name] 01/16/2021 2:15 PM Medical Record Number: [id number] Patient Account Number: [id number] Date of Birth/Sex: Treating RN: 1961-11-29 (60 y.o. Mike [name] Primary Care Provider: PA Haig Prophet, [place] Other Clinician: Referring Provider: Treating Provider/Extender: Mardella Layman, Manish Weeks in Treatment: 7 Active Problems ICD-10 Encounter Code Description Active Date MDM Diagnosis [phone] Acquired absence of left leg above knee 01/16/2021 No Yes T81.31XA Disruption of external operation (surgical) wound, not elsewhere classified, 12/19/2020 No Yes initial encounter I25.10 Atherosclerotic heart disease of native coronary artery without angina pectoris 11/28/2020 No Yes I73.89 Other specified peripheral vascular diseases 11/28/2020 No Yes I10 Essential (primary) hypertension 11/28/2020 No Yes Inactive Problems Resolved Problems ICD-10 Code Description Active Date Resolved Date L89.323 Pressure ulcer of left buttock, stage 3 11/28/2020 11/28/2020 Electronic Signature(s) Signed: 01/16/2021 5:43:48 PM By: Worthy Keeler PA-C Previous Signature: 01/16/2021 5:16:39 PM Version By: Worthy Keeler PA-C Previous Signature: 01/16/2021 2:56:03 PM Version By: Worthy Keeler PA-C Entered By:  Worthy Keeler on 01/16/2021 17:43:48 -------------------------------------------------------------------------------- Progress Note Details Patient Name: Date of Service: Mike [name] HN S. 01/16/2021 2:15 PM Medical Record Number: [id number] Patient Account Number: [id number] Date of Birth/Sex: Treating RN: November 13, 1961 (60 y.o. Mike [name] Primary Care Provider: PA Haig Prophet, [place] Other Clinician: Referring Provider: Treating Provider/Extender: Mardella Layman, Manish Weeks in Treatment: 7 Subjective Chief Complaint Information obtained from Patient Left gluteal pressure ulcer History of Present Illness (HPI) 11/28/2020 patient presents for initial evaluation here in our clinic concerning issues that he has been having with wounds actually in the left gluteal region. These occurred following  his surgery and he actually had surgery which included a left above-knee amputation. Unfortunately this was necessitated by the fact that the patient had a fairly complicated recent vascular and cardiac history including a STEMI on 08/17/2020. This was complicated by cardiac arrest secondary to chronic ST elevations in the inferior leads, severe bilateral renal artery stenosis, peripheral arterial disease with critical limb ischemia requiring right iliac revascularization of the left lower extremity, chronic congestive heart failure systolic with ischemic cardiomyopathy with complaints of shortness of breath, decreased ability to walk due to severe leg pain and again the patient had acute critical limb ischemia. He was too high of a risk being that he was on aspirin and Brilinta due to the recent STEMI and therefore was not a candidate for revascularization. Because he was decompensating so quickly it was elected to do an above-knee amputation to treat the progressing gangrene prior to this worsening significantly. This was done up by Dr. Donnetta Hutching on November 06, 2020. He did have a Foley  catheter during the time that he was in the hospital. He was there from October 30, 2020 through November 15, 2020. It was during this time that he developed the wounds in the gluteal region on the left. The wounds appear to be in somewhat of a linear pattern that makes me in the suspect that he may have developed these as a result of having sat on a tubing such as a catheter tubing or something of the sort for too long causing a pressure injury although I cannot know that for sure. Not this far out. Currently the patient is on aspirin and Plavix and is currently in skilled nursing at [facility]. 12/19/2020 upon evaluation today patient appears to be doing well with regard to his gluteal wounds are completely healed. That is great news. The main issue is actually with his above-knee amputation stump on the left. He unfortunately is having trouble here with getting this to heal part of the medial aspect is eschar covered and I think this is good have to clear away in order to see this improve. I do believe however that we can initiate Santyl to try to help loosen this up in the future we should be able to debride this away much more effectively and quickly. 01/09/2021 upon evaluation today patient appears to be doing well with regard to his wound currently. He actually appears to be doing quite well and I do believe the Annitta Needs has been beneficial currently. There is much looser necrotic tissue noted in the wound bed we are going to need to clean this out I do think a wound VAC may likely be ideal for him in the situation 01/16/2021 upon evaluation today patient appears to be doing well with regard to his wound. In fact the slough seems to be loosening up quite significantly today and the Santyl has really been doing a good job. He has gotten approval for the wound VAC and that should be delivered hopefully tomorrow Objective Constitutional Well-nourished and well-hydrated in no acute  distress. Vitals Time Taken: 2:30 PM, Height: 72 in, Weight: 135 lbs, BMI: 18.3, Temperature: 98.3 F, Pulse: 86 bpm, Respiratory Rate: 16 breaths/min, Blood Pressure: 157/87 mmHg. Respiratory normal breathing without difficulty. Psychiatric this patient is able to make decisions and demonstrates good insight into disease process. Alert and Oriented x 3. pleasant and cooperative. General Notes: Upon inspection patient's wound bed actually showed signs of good granulation at this time. There was some slough noted I  did warm sharp debridement to remove some of the necrotic debris. He tolerated that without complication postdebridement the wound bed appears to be doing excellent and I am very pleased. No excessive bleeding was noted. I think he is definitely ready for the wound VAC today Integumentary (Hair, Skin) Wound #3 status is Open. Original cause of wound was Surgical Injury. The wound is located on the Left,Medial Amputation Site - Above Knee. The wound measures 4.1cm length x 2cm width x 0.7cm depth; 6.44cm^2 area and 4.508cm^3 volume. There is Fat Layer (Subcutaneous Tissue) exposed. There is no tunneling or undermining noted. There is a large amount of serosanguineous drainage noted. The wound margin is well defined and not attached to the wound base. There is medium (34-66%) pink granulation within the wound bed. There is a medium (34-66%) amount of necrotic tissue within the wound bed including Adherent Slough. Assessment Active Problems ICD-10 Acquired absence of left leg above knee Disruption of external operation (surgical) wound, not elsewhere classified, initial encounter Atherosclerotic heart disease of native coronary artery without angina pectoris Other specified peripheral vascular diseases Essential (primary) hypertension Procedures Wound #3 Pre-procedure diagnosis of Wound #3 is a Dehisced Wound located on the Left,Medial Amputation Site - Above Knee . There was a  Excisional Skin/Subcutaneous Tissue Debridement with a total area of 8.2 sq cm performed by Worthy Keeler, PA. With the following instrument(s): Curette to remove Viable and Non-Viable tissue/material. Material removed includes Subcutaneous Tissue and Slough and after achieving pain control using Lidocaine 5% topical ointment. No specimens were taken. A time out was conducted at 15:15, prior to the start of the procedure. A Minimum amount of bleeding was controlled with Pressure. The procedure was tolerated well with a pain level of 0 throughout and a pain level of 0 following the procedure. Post Debridement Measurements: 4.1cm length x 2cm width x 0.7cm depth; 4.508cm^3 volume. Character of Wound/Ulcer Post Debridement requires further debridement. Post procedure Diagnosis Wound #3: Same as Pre-Procedure Plan Follow-up Appointments: Return Appointment in 1 week. Bathing/ Shower/ Hygiene: May shower and wash wound with soap and water. Negative Presssure Wound Therapy: Wound Vac to wound continuously at 165mm/hg pressure - to be placed by home health when available ([facility] to deliver to pt's home 1/20) [place]: No change in wound care orders this week; continue Home Health for wound care. May utilize formulary equivalent dressing for wound treatment orders unless otherwise specified. Dressing changes to be completed by [place] on Monday / Wednesday / Friday except when patient has scheduled visit at [name]. Other Home Health Orders/Instructions: - Interim WOUND #3: - Amputation Site - Above Knee Wound Laterality: Left, Medial Cleanser: Soap and Water 1 x Per VVO/16 Days Discharge Instructions: May shower and wash wound with dial antibacterial soap and water prior to dressing change. Prim Dressing: Santyl Ointment 1 x Per Day/15 Days ary Discharge Instructions: Apply nickel thick amount to wound bed. Discontinue once VAC applied. Prim Dressing: Saline moistened  gauze 1 x Per Day/15 Days ary Discharge Instructions: Apply saline moistened gauze over Santyl Secondary Dressing: Woven Gauze Sponge, Non-Sterile 4x4 in (Generic) 1 x Per Day/15 Days Discharge Instructions: Apply over primary dressing moistened with gauze over saline Secondary Dressing: ComfortFoam Border, 6x6 in (silicone border) 1 x Per Day/15 Days Discharge Instructions: Apply over primary dressing as directed. Secondary Dressing: Bordered Gauze, 6x6 (in/in) (Generic) 1 x Per Day/15 Days Discharge Instructions: Apply over primary dressing as directed. 1. Would recommend currently that  we go ahead and continue with the Santyl until the patient receives the wound VAC and home health initiate treatment with this. 2. I do think start the wound VAC as soon as possible is ideal. Hopefully that should be delivered tomorrow for initiation Friday. 3. I am also can recommend the patient continue with protecting the area as far as the pressure is concerned although I am not really too concerned about that due to location on the medial aspect of his leg I do not think is likely to receive a lot of pressure to this location. We will see patient back for reevaluation in 1 week here in the clinic. If anything worsens or changes patient will contact our office for additional recommendations. Electronic Signature(s) Signed: 01/16/2021 5:44:02 PM By: Worthy Keeler PA-C Previous Signature: 01/16/2021 3:27:17 PM Version By: Worthy Keeler PA-C Entered By: Worthy Keeler on 01/16/2021 17:44:01 -------------------------------------------------------------------------------- SuperBill Details Patient Name: Date of Service: Mike [name], Ashok Croon S. 01/16/2021 Medical Record Number: [id number] Patient Account Number: [id number] Date of Birth/Sex: Treating RN: [birth year] (60 y.o. Mike [name] Primary Care Provider: PA TIENT, [place] Other Clinician: Referring Provider: Treating Provider/Extender: Mardella Layman, Manish Weeks in Treatment: 7 Diagnosis Coding ICD-10 Codes Code Description T81.31XA Disruption of external operation (surgical) wound, not elsewhere classified, initial encounter L89.323 Pressure ulcer of left buttock, stage 3 I25.10 Atherosclerotic heart disease of native coronary artery without angina pectoris I73.89 Other specified peripheral vascular diseases I10 Essential (primary) hypertension Facility Procedures CPT4 Code: [id number] Description: [id number] - DEB SUBQ TISSUE 20 SQ CM/< ICD-10 Diagnosis Description T81.31XA Disruption of external operation (surgical) wound, not elsewhere classified, init Modifier: ial encounter Quantity: 1 Physician Procedures : CPT4 Code Description Modifier [id number] - WC PHYS SUBQ TISS 20 SQ CM ICD-10 Diagnosis Description T81.31XA Disruption of external operation (surgical) wound, not elsewhere classified, initial encounter Quantity: 1 Electronic Signature(s) Signed: 01/16/2021 3:27:46 PM By: Worthy Keeler PA-C Entered By: Worthy Keeler on 01/16/2021 15:27:46

## 2021-01-16 NOTE — Progress Notes (Signed)
Mike [name] ([id number]) Visit Report for 01/16/2021 Arrival Information Details Patient Name: Date of Service: Mike [name]. 01/16/2021 2:15 PM Medical Record Number: [id number] Patient Account Number: [id number] Date of Birth/Sex: Treating RN: October 28, 1961 (60 y.o. Mike [name] Primary Care Mike [name]: PA TIENT, [name] Other Clinician: Referring Mike [name]: Treating Mike [name]/Extender: Mike [name], Mike [name] in Treatment: 7 Visit Information History Since Last Visit Added or deleted any medications: [name] Patient Arrived: Mike [name] Arrival Time: 14:28 Had a fall or experienced change in [name] Accompanied By: self activities of daily living that may affect Transfer Assistance: None risk of falls: Patient Identification Verified: Yes Signs or symptoms of abuse/neglect since last visito [name] Secondary Verification Process Completed: Yes Hospitalized since last visit: [name] Patient Requires Transmission-Based Precautions: [name] Implantable device outside of the clinic excluding [name] Patient Has Alerts: Yes cellular tissue based products placed in the center Patient Alerts: Patient on Blood Thinner since last visit: Has Dressing in Place as Prescribed: Yes Pain Present Now: [name] Notes per patient wound vac to arrive tomorrow. Electronic Signature(s) Signed: 01/16/2021 5:28:00 PM By: Mike [name] Entered By: Mike [name] on 01/16/2021 14:30:05 -------------------------------------------------------------------------------- Encounter Discharge Information Details Patient Name: Date of Service: Mike Dike HN S. 01/16/2021 2:15 PM Medical Record Number: [id number] Patient Account Number: [id number] Date of Birth/Sex: Treating RN: [birth year] (60 y.o. Mike [name] Primary Care Mike [name]: PA Mike [name], [name] Other Clinician: Referring Mike [name]: Treating Mike [name]/Extender: Mike [name], Mike [name] in Treatment:  7 Encounter Discharge Information Items Post Procedure Vitals Discharge Condition: Stable Temperature (F): 98.3 Ambulatory Status: Walker Pulse (bpm): 86 Discharge Destination: Home Respiratory Rate (breaths/min): 17 Transportation: Private Auto Blood Pressure (mmHg): 157/87 Accompanied By: Heriberto Antigua Schedule Follow-up Appointment: Yes Clinical Summary of Care: Patient Declined Electronic Signature(s) Signed: 01/16/2021 5:25:13 PM By: Mike Hammock RN Entered By: Mike [name] on 01/16/2021 15:55:34 -------------------------------------------------------------------------------- Lower Extremity Assessment Details Patient Name: Date of Service: Mike Dike HN S. 01/16/2021 2:15 PM Medical Record Number: [id number] Patient Account Number: [id number] Date of Birth/Sex: Treating RN: 09/21/1961 (60 y.o. Mike [name] Primary Care Mike [name]: PA Mike [name], [name] Other Clinician: Referring Mike [name]: Treating Mike [name]/Extender: Mike [name], Mike [name] in Treatment: 7 Electronic Signature(s) Signed: 01/16/2021 5:28:00 PM By: Mike [name] Entered By: Mike [name] on 01/16/2021 14:32:02 -------------------------------------------------------------------------------- [place] Details Patient Name: Date of Service: Mike Dike HN S. 01/16/2021 2:15 PM Medical Record Number: [id number] Patient Account Number: [id number] Date of Birth/Sex: Treating RN: July 09, 1961 (60 y.o. Mike [name] Primary Care Akaisha Truman: PA Mike [name], [name] Other Clinician: Referring Mike [name]: Treating Mike [name]/Extender: Mike [name], Mike [name] in Treatment: 7 Active Inactive Wound/Skin Impairment Nursing Diagnoses: Impaired tissue integrity Knowledge deficit related to ulceration/compromised skin integrity Goals: Patient/caregiver will verbalize understanding of skin care regimen Date Initiated: 11/28/2020 Target Resolution Date: 02/13/2021 Goal Status:  Active Ulcer/skin breakdown will have a volume reduction of 30% by week 4 Date Initiated: 11/28/2020 Date Inactivated: 12/19/2020 Target Resolution Date: 12/26/2020 Goal Status: Met Interventions: Assess patient/caregiver ability to obtain necessary supplies Assess patient/caregiver ability to perform ulcer/skin care regimen upon admission and as needed Assess ulceration(s) every visit Provide education on ulcer and skin care Treatment Activities: Skin care regimen initiated : 11/28/2020 Topical wound management initiated : 11/28/2020 Notes: Electronic Signature(s) Signed: 01/16/2021 5:51:30 PM By: Mike Gouty RN, [name] Entered By: Mike [name] on 01/16/2021 15:15:00 -------------------------------------------------------------------------------- Pain Assessment Details Patient Name: Date of Service: Mike Dike HN S. 01/16/2021  2:15 PM Medical Record Number: [id number] Patient Account Number: [id number] Date of Birth/Sex: Treating RN: 04/15/1961 (60 y.o. Mike [name] Primary Care Makinsey Pepitone: PA Mike [name], [name] Other Clinician: Referring Mike [name]: Treating Mike [name]/Extender: Mike [name], Mike [name] in Treatment: 7 Active Problems Location of Pain Severity and Description of Pain Patient Has Paino [name] Site Locations Rate the pain. Current Pain Level: 0 Pain Management and Medication Current Pain Management: Medication: [name] Cold Application: [name] Rest: [name] Massage: [name] Activity: [name] T.E.N.S.: [name] Heat Application: [name] Leg drop or elevation: [name] Is the Current Pain Management Adequate: Adequate How does your wound impact your activities of daily livingo Sleep: [name] Bathing: [name] Appetite: [name] Relationship With Others: [name] Bladder Continence: [name] Emotions: [name] Bowel Continence: [name] Work: [name] Toileting: [name] Drive: [name] Dressing: [name] Hobbies: [name] Electronic Signature(s) Signed: 01/16/2021 5:28:00 PM By: Mike [name] Entered By: Mike [name] on 01/16/2021  14:31:55 -------------------------------------------------------------------------------- Patient/Caregiver Education Details Patient Name: Date of Service: Mike [name] 1/19/2022andnbsp2:15 PM Medical Record Number: [id number] Patient Account Number: [id number] Date of Birth/Gender: Treating RN: 04/24/1961 (60 y.o. Mike [name] Primary Care Physician: PA Mike [name], [name] Other Clinician: Referring Physician: Treating Physician/Extender: Mike [name], Mike [name] in Treatment: 7 Education Assessment Education Provided To: Patient Education Topics Provided Wound/Skin Impairment: Methods: Explain/Verbal Responses: Reinforcements needed, State content correctly Electronic Signature(s) Signed: 01/16/2021 5:51:30 PM By: Mike Gouty RN, [name] Entered By: Mike [name] on 01/16/2021 15:15:58 -------------------------------------------------------------------------------- Wound Assessment Details Patient Name: Date of Service: Mike Dike HN S. 01/16/2021 2:15 PM Medical Record Number: [id number] Patient Account Number: [id number] Date of Birth/Sex: Treating RN: [birth year] (60 y.o. Mike [name] Primary Care Prudie Guthridge: PA TIENT, [name] Other Clinician: Referring Keawe Marcello: Treating Ezio Wieck/Extender: Mike [name], Mike [name] in Treatment: 7 Wound Status Wound Number: 3 Primary Dehisced Wound Etiology: Wound Location: Left, Medial Amputation Site - Above Knee Wound Open Wounding Event: Surgical Injury Status: Date Acquired: 11/06/2020 Comorbid Sleep Apnea, Coronary Artery Disease, Hypotension, Myocardial [name] Of Treatment: 4 History: Infarction, Peripheral Arterial Disease Clustered Wound: [name] Wound Measurements Length: (cm) 4.1 Width: (cm) 2 Depth: (cm) 0.7 Area: (cm) 6.44 Volume: (cm) 4.508 % Reduction in Area: 43.4% % Reduction in Volume: 60.4% Epithelialization: Small (1-33%) Tunneling: [name] Undermining: [name] Wound  Description Classification: Full Thickness Without Exposed Support Structures Wound Margin: Well defined, not attached Exudate Amount: Large Exudate Type: Serosanguineous Exudate Color: red, brown Foul Odor After Cleansing: [name] Slough/Fibrino Yes Wound Bed Granulation Amount: Medium (34-66%) Exposed Structure Granulation Quality: Pink Fascia Exposed: [name] Necrotic Amount: Medium (34-66%) Fat Layer (Subcutaneous Tissue) Exposed: Yes Necrotic Quality: Adherent Slough Tendon Exposed: [name] Muscle Exposed: [name] Joint Exposed: [name] Bone Exposed: [name] Treatment Notes Wound #3 (Amputation Site - Above Knee) Wound Laterality: Left, Medial Cleanser Soap and Water Discharge Instruction: May shower and wash wound with dial antibacterial soap and water prior to dressing change. Peri-Wound Care Topical Primary Dressing Santyl Ointment Discharge Instruction: Apply nickel thick amount to wound bed. Discontinue once VAC applied. Saline moistened gauze Discharge Instruction: Apply saline moistened gauze over Santyl Secondary Dressing Woven Gauze Sponge, Non-Sterile 4x4 in Discharge Instruction: Apply over primary dressing moistened with gauze over saline ComfortFoam Border, 6x6 in (silicone border) Discharge Instruction: Apply over primary dressing as directed. Bordered Gauze, 6x6 (in/in) Discharge Instruction: Apply over primary dressing as directed. Secured With Compression Wrap Compression Stockings Environmental education officer) Signed: 01/16/2021 5:28:00 PM By: Mike [name] Entered By: Mike [name] on 01/16/2021 14:33:54 -------------------------------------------------------------------------------- Vitals Details Patient Name: Date of  Service: [name] 01/16/2021 2:15 PM Medical Record Number: [id number] Patient Account Number: [id number] Date of Birth/Sex: Treating RN: 03/16/1961 (60 y.o. Mike [name] Primary Care Dat Derksen: PA TIENT, [name] Other Clinician: Referring  Claud Gowan: Treating Yicel Shannon/Extender: Mike [name], Mike [name] in Treatment: 7 Vital Signs Time Taken: 14:30 Temperature (F): 98.3 Height (in): 72 Pulse (bpm): 86 Weight (lbs): 135 Respiratory Rate (breaths/min): 16 Body Mass Index (BMI): 18.3 Blood Pressure (mmHg): 157/87 Reference Range: 80 - 120 mg / dl Electronic Signature(s) Signed: 01/16/2021 5:28:00 PM By: Mike [name] Entered By: Mike [name] on 01/16/2021 14:31:39

## 2021-01-17 DIAGNOSIS — T8189XA Other complications of procedures, not elsewhere classified, initial encounter: Secondary | ICD-10-CM | POA: Diagnosis not present

## 2021-01-18 DIAGNOSIS — M6281 Muscle weakness (generalized): Secondary | ICD-10-CM | POA: Diagnosis not present

## 2021-01-18 DIAGNOSIS — R2990 Unspecified symptoms and signs involving the nervous system: Secondary | ICD-10-CM | POA: Diagnosis not present

## 2021-01-18 DIAGNOSIS — N179 Acute kidney failure, unspecified: Secondary | ICD-10-CM | POA: Diagnosis not present

## 2021-01-18 DIAGNOSIS — Z720 Tobacco use: Secondary | ICD-10-CM | POA: Diagnosis not present

## 2021-01-18 DIAGNOSIS — I340 Nonrheumatic mitral (valve) insufficiency: Secondary | ICD-10-CM | POA: Diagnosis not present

## 2021-01-18 DIAGNOSIS — I739 Peripheral vascular disease, unspecified: Secondary | ICD-10-CM | POA: Diagnosis not present

## 2021-01-18 DIAGNOSIS — I5020 Unspecified systolic (congestive) heart failure: Secondary | ICD-10-CM | POA: Diagnosis not present

## 2021-01-18 DIAGNOSIS — Z89612 Acquired absence of left leg above knee: Secondary | ICD-10-CM | POA: Diagnosis not present

## 2021-01-18 DIAGNOSIS — R2689 Other abnormalities of gait and mobility: Secondary | ICD-10-CM | POA: Diagnosis not present

## 2021-01-18 DIAGNOSIS — I6309 Cerebral infarction due to thrombosis of other precerebral artery: Secondary | ICD-10-CM | POA: Diagnosis not present

## 2021-01-18 DIAGNOSIS — I10 Essential (primary) hypertension: Secondary | ICD-10-CM | POA: Diagnosis not present

## 2021-01-18 DIAGNOSIS — I213 ST elevation (STEMI) myocardial infarction of unspecified site: Secondary | ICD-10-CM | POA: Diagnosis not present

## 2021-01-18 DIAGNOSIS — T8189XA Other complications of procedures, not elsewhere classified, initial encounter: Secondary | ICD-10-CM | POA: Diagnosis not present

## 2021-01-18 DIAGNOSIS — E785 Hyperlipidemia, unspecified: Secondary | ICD-10-CM | POA: Diagnosis not present

## 2021-01-19 DIAGNOSIS — T8189XA Other complications of procedures, not elsewhere classified, initial encounter: Secondary | ICD-10-CM | POA: Diagnosis not present

## 2021-01-20 DIAGNOSIS — T8189XA Other complications of procedures, not elsewhere classified, initial encounter: Secondary | ICD-10-CM | POA: Diagnosis not present

## 2021-01-21 ENCOUNTER — Other Ambulatory Visit: Payer: Self-pay

## 2021-01-21 ENCOUNTER — Ambulatory Visit: Payer: BC Managed Care – PPO

## 2021-01-21 DIAGNOSIS — R2689 Other abnormalities of gait and mobility: Secondary | ICD-10-CM | POA: Diagnosis not present

## 2021-01-21 DIAGNOSIS — I6309 Cerebral infarction due to thrombosis of other precerebral artery: Secondary | ICD-10-CM | POA: Diagnosis not present

## 2021-01-21 DIAGNOSIS — I10 Essential (primary) hypertension: Secondary | ICD-10-CM | POA: Diagnosis not present

## 2021-01-21 DIAGNOSIS — I5020 Unspecified systolic (congestive) heart failure: Secondary | ICD-10-CM | POA: Diagnosis not present

## 2021-01-21 DIAGNOSIS — Z89612 Acquired absence of left leg above knee: Secondary | ICD-10-CM | POA: Diagnosis not present

## 2021-01-21 DIAGNOSIS — I340 Nonrheumatic mitral (valve) insufficiency: Secondary | ICD-10-CM | POA: Diagnosis not present

## 2021-01-21 DIAGNOSIS — R2990 Unspecified symptoms and signs involving the nervous system: Secondary | ICD-10-CM | POA: Diagnosis not present

## 2021-01-21 DIAGNOSIS — T8189XA Other complications of procedures, not elsewhere classified, initial encounter: Secondary | ICD-10-CM | POA: Diagnosis not present

## 2021-01-21 DIAGNOSIS — I213 ST elevation (STEMI) myocardial infarction of unspecified site: Secondary | ICD-10-CM | POA: Diagnosis not present

## 2021-01-21 DIAGNOSIS — M6281 Muscle weakness (generalized): Secondary | ICD-10-CM | POA: Diagnosis not present

## 2021-01-21 DIAGNOSIS — N179 Acute kidney failure, unspecified: Secondary | ICD-10-CM | POA: Diagnosis not present

## 2021-01-21 DIAGNOSIS — I739 Peripheral vascular disease, unspecified: Secondary | ICD-10-CM | POA: Diagnosis not present

## 2021-01-21 DIAGNOSIS — Z720 Tobacco use: Secondary | ICD-10-CM | POA: Diagnosis not present

## 2021-01-21 DIAGNOSIS — E785 Hyperlipidemia, unspecified: Secondary | ICD-10-CM | POA: Diagnosis not present

## 2021-01-22 DIAGNOSIS — T8189XA Other complications of procedures, not elsewhere classified, initial encounter: Secondary | ICD-10-CM | POA: Diagnosis not present

## 2021-01-23 ENCOUNTER — Encounter (HOSPITAL_BASED_OUTPATIENT_CLINIC_OR_DEPARTMENT_OTHER): Payer: BC Managed Care – PPO | Admitting: Physician Assistant

## 2021-01-23 ENCOUNTER — Ambulatory Visit (INDEPENDENT_AMBULATORY_CARE_PROVIDER_SITE_OTHER): Payer: BC Managed Care – PPO | Admitting: Podiatry

## 2021-01-23 ENCOUNTER — Other Ambulatory Visit: Payer: Self-pay

## 2021-01-23 DIAGNOSIS — Z8674 Personal history of sudden cardiac arrest: Secondary | ICD-10-CM | POA: Diagnosis not present

## 2021-01-23 DIAGNOSIS — Y835 Amputation of limb(s) as the cause of abnormal reaction of the patient, or of later complication, without mention of misadventure at the time of the procedure: Secondary | ICD-10-CM | POA: Diagnosis not present

## 2021-01-23 DIAGNOSIS — I255 Ischemic cardiomyopathy: Secondary | ICD-10-CM | POA: Diagnosis not present

## 2021-01-23 DIAGNOSIS — Z9889 Other specified postprocedural states: Secondary | ICD-10-CM | POA: Diagnosis not present

## 2021-01-23 DIAGNOSIS — R2689 Other abnormalities of gait and mobility: Secondary | ICD-10-CM | POA: Diagnosis not present

## 2021-01-23 DIAGNOSIS — I340 Nonrheumatic mitral (valve) insufficiency: Secondary | ICD-10-CM | POA: Diagnosis not present

## 2021-01-23 DIAGNOSIS — M6281 Muscle weakness (generalized): Secondary | ICD-10-CM | POA: Diagnosis not present

## 2021-01-23 DIAGNOSIS — I110 Hypertensive heart disease with heart failure: Secondary | ICD-10-CM | POA: Diagnosis not present

## 2021-01-23 DIAGNOSIS — T8189XA Other complications of procedures, not elsewhere classified, initial encounter: Secondary | ICD-10-CM | POA: Diagnosis not present

## 2021-01-23 DIAGNOSIS — T8781 Dehiscence of amputation stump: Secondary | ICD-10-CM | POA: Diagnosis not present

## 2021-01-23 DIAGNOSIS — I5022 Chronic systolic (congestive) heart failure: Secondary | ICD-10-CM | POA: Diagnosis not present

## 2021-01-23 DIAGNOSIS — I5020 Unspecified systolic (congestive) heart failure: Secondary | ICD-10-CM | POA: Diagnosis not present

## 2021-01-23 DIAGNOSIS — N179 Acute kidney failure, unspecified: Secondary | ICD-10-CM | POA: Diagnosis not present

## 2021-01-23 DIAGNOSIS — I7389 Other specified peripheral vascular diseases: Secondary | ICD-10-CM | POA: Diagnosis not present

## 2021-01-23 DIAGNOSIS — I252 Old myocardial infarction: Secondary | ICD-10-CM | POA: Diagnosis not present

## 2021-01-23 DIAGNOSIS — I739 Peripheral vascular disease, unspecified: Secondary | ICD-10-CM

## 2021-01-23 DIAGNOSIS — S78112A Complete traumatic amputation at level between left hip and knee, initial encounter: Secondary | ICD-10-CM | POA: Diagnosis not present

## 2021-01-23 DIAGNOSIS — Z720 Tobacco use: Secondary | ICD-10-CM | POA: Diagnosis not present

## 2021-01-23 DIAGNOSIS — I6309 Cerebral infarction due to thrombosis of other precerebral artery: Secondary | ICD-10-CM | POA: Diagnosis not present

## 2021-01-23 DIAGNOSIS — E785 Hyperlipidemia, unspecified: Secondary | ICD-10-CM | POA: Diagnosis not present

## 2021-01-23 DIAGNOSIS — L97822 Non-pressure chronic ulcer of other part of left lower leg with fat layer exposed: Secondary | ICD-10-CM | POA: Diagnosis not present

## 2021-01-23 DIAGNOSIS — I2510 Atherosclerotic heart disease of native coronary artery without angina pectoris: Secondary | ICD-10-CM | POA: Diagnosis not present

## 2021-01-23 DIAGNOSIS — I213 ST elevation (STEMI) myocardial infarction of unspecified site: Secondary | ICD-10-CM | POA: Diagnosis not present

## 2021-01-23 DIAGNOSIS — Z89612 Acquired absence of left leg above knee: Secondary | ICD-10-CM | POA: Diagnosis not present

## 2021-01-23 DIAGNOSIS — R2990 Unspecified symptoms and signs involving the nervous system: Secondary | ICD-10-CM | POA: Diagnosis not present

## 2021-01-23 DIAGNOSIS — T8131XA Disruption of external operation (surgical) wound, not elsewhere classified, initial encounter: Secondary | ICD-10-CM | POA: Diagnosis not present

## 2021-01-23 DIAGNOSIS — I10 Essential (primary) hypertension: Secondary | ICD-10-CM | POA: Diagnosis not present

## 2021-01-23 NOTE — Progress Notes (Signed)
   HPI: 60 y.o. male presenting today as a new patient for evaluation of right lower extremity exam.  Patient has a known history of peripheral arterial disease and stroke.  Patient states he was a smoker for 40 years.  Most recently approximately 2 months ago he underwent above-knee amputation.  He also had stents placed in his right lower extremity.  Patient presents today to have his right lower extremity evaluated and to establish care  Past Medical History:  Diagnosis Date  . CAD (coronary artery disease)   . Heart attack ([place])   . Hyperlipidemia   . Hypertension   . PAD (peripheral artery disease) ([place])      Physical Exam: General: The patient is alert and oriented x3 in no acute distress.  Dermatology: Skin is warm, dry and supple bilateral lower extremities. Negative for open lesions or macerations.  Hyperkeratotic dystrophic nails noted 1-5 of the right lower extremity  Vascular: Diminished, however palpable pedal pulses right lower extremity. No edema or erythema noted.  Capillary refill delayed right lower extremity Neurological: Epicritic and protective threshold diminished bilaterally.   Musculoskeletal Exam: No pedal deformity right lower extremity.  AKA LLE  Assessment: 1. PAD B/L LE 2. H/o AKA LLE  Plan of Care:  1. Patient evaluated.  2.  Comprehensive foot exam was performed today. 3.  Recommend good foot hygiene at home.  Recommend not going barefoot 4.  Explained to the patient that the goal is to prevent any ulcers or wounds to develop to the right lower extremity that could cause complications that may lead up to amputation 5.  Recommend returning to the clinic in 3 months for routine establish foot care      Edrick Kins, DPM [facility]  Dr. Edrick Kins, DPM    [address], [place] [address]                Office [phone]  Fax [phone]

## 2021-01-23 NOTE — Progress Notes (Addendum)
Mike, [name] ([id number]) Visit Report for 01/23/2021 Chief Complaint Document Details Patient Name: Date of Service: Mike [name]. 01/23/2021 3:30 PM Medical Record Number: [id number] Patient Account Number: [id number] Date of Birth/Sex: Treating RN: June 28, 1961 (60 y.o. [name] Primary Care Provider: PA Haig Prophet, [place] Other Clinician: Referring Provider: Treating Provider/Extender: Mardella Layman, Manish Weeks in Treatment: 8 Information Obtained from: Patient Chief Complaint Left gluteal pressure ulcer Electronic Signature(s) Signed: 01/23/2021 3:41:54 PM By: Worthy Keeler PA-C Entered By: Worthy Keeler on 01/23/2021 15:41:54 -------------------------------------------------------------------------------- HPI Details Patient Name: Date of Service: Mike [name] HN S. 01/23/2021 3:30 PM Medical Record Number: [id number] Patient Account Number: [id number] Date of Birth/Sex: Treating RN: [birth year] (60 y.o. [name] Primary Care Provider: PA Haig Prophet, [place] Other Clinician: Referring Provider: Treating Provider/Extender: Mardella Layman, Manish Weeks in Treatment: 8 History of Present Illness HPI Description: 11/28/2020 patient presents for initial evaluation here in our clinic concerning issues that he has been having with wounds actually in the left gluteal region. These occurred following his surgery and he actually had surgery which included a left above-knee amputation. Unfortunately this was necessitated by the fact that the patient had a fairly complicated recent vascular and cardiac history including a STEMI on 08/17/2020. This was complicated by cardiac arrest secondary to chronic ST elevations in the inferior leads, severe bilateral renal artery stenosis, peripheral arterial disease with critical limb ischemia requiring right iliac revascularization of the left lower extremity, chronic congestive heart failure systolic with  ischemic cardiomyopathy with complaints of shortness of breath, decreased ability to walk due to severe leg pain and again the patient had acute critical limb ischemia. He was too high of a risk being that he was on aspirin and Brilinta due to the recent STEMI and therefore was not a candidate for revascularization. Because he was decompensating so quickly it was elected to do an above-knee amputation to treat the progressing gangrene prior to this worsening significantly. This was done up by Dr. Donnetta Hutching on November 06, 2020. He did have a Foley catheter during the time that he was in the hospital. He was there from October 30, 2020 through November 15, 2020. It was during this time that he developed the wounds in the gluteal region on the left. The wounds appear to be in somewhat of a linear pattern that makes me in the suspect that he may have developed these as a result of having sat on a tubing such as a catheter tubing or something of the sort for too long causing a pressure injury although I cannot know that for sure. Not this far out. Currently the patient is on aspirin and Plavix and is currently in skilled nursing at [facility]. 12/19/2020 upon evaluation today patient appears to be doing well with regard to his gluteal wounds are completely healed. That is great news. The main issue is actually with his above-knee amputation stump on the left. He unfortunately is having trouble here with getting this to heal part of the medial aspect is eschar covered and I think this is good have to clear away in order to see this improve. I do believe however that we can initiate Santyl to try to help loosen this up in the future we should be able to debride this away much more effectively and quickly. 01/09/2021 upon evaluation today patient appears to be doing well with regard to his wound currently. He actually appears to be doing quite well  and I do believe the Santyl has been beneficial currently.  There is much looser necrotic tissue noted in the wound bed we are going to need to clean this out I do think a wound VAC may likely be ideal for him in the situation 01/16/2021 upon evaluation today patient appears to be doing well with regard to his wound. In fact the slough seems to be loosening up quite significantly today and the Santyl has really been doing a good job. He has gotten approval for the wound VAC and that should be delivered hopefully tomorrow 01/23/2021 on evaluation today patient actually appears to be doing quite well in regard to his wound. I do believe the wound VAC has done extremely well for him he said that since Friday and already just over the weekend he has noticed a lot of improvement even the home health nurse was surprised by how much improvement. There does not appear to be any evidence of active infection at this time. No fevers, chills, nausea, vomiting, or diarrhea. Electronic Signature(s) Signed: 01/23/2021 4:54:13 PM By: Worthy Keeler PA-C Entered By: Worthy Keeler on 01/23/2021 16:54:13 -------------------------------------------------------------------------------- Physical Exam Details Patient Name: Date of Service: Mike [name] S. 01/23/2021 3:30 PM Medical Record Number: [id number] Patient Account Number: [id number] Date of Birth/Sex: Treating RN: 23-Dec-1961 (60 y.o. [name] Primary Care Provider: PA Haig Prophet, [place] Other Clinician: Referring Provider: Treating Provider/Extender: Mardella Layman, Manish Weeks in Treatment: 8 Constitutional Well-nourished and well-hydrated in no acute distress. Respiratory normal breathing without difficulty. Psychiatric this patient is able to make decisions and demonstrates good insight into disease process. Alert and Oriented x 3. pleasant and cooperative. Notes Inspection patient's wound did not require sharp debridement I did clean this just with saline and gauze mechanically he tolerated  that without complication post debridement the wound bed appears to be very clean and looks well. Electronic Signature(s) Signed: 01/23/2021 4:54:27 PM By: Worthy Keeler PA-C Entered By: Worthy Keeler on 01/23/2021 16:54:26 -------------------------------------------------------------------------------- Physician Orders Details Patient Name: Date of Service: Mike [name] S. 01/23/2021 3:30 PM Medical Record Number: [id number] Patient Account Number: [id number] Date of Birth/Sex: Treating RN: [birth year] (60 y.o. [name] Primary Care Provider: PA TIENT, NO Other Clinician: Referring Provider: Treating Provider/Extender: Mardella Layman, Manish Weeks in Treatment: 8 Verbal / Phone Orders: No Diagnosis Coding ICD-10 Coding Code Description [phone] Acquired absence of left leg above knee T81.31XA Disruption of external operation (surgical) wound, not elsewhere classified, initial encounter I25.10 Atherosclerotic heart disease of native coronary artery without angina pectoris I73.89 Other specified peripheral vascular diseases I10 Essential (primary) hypertension Follow-up Appointments Return Appointment in 2 weeks. Bathing/ Shower/ Hygiene May shower and wash wound with soap and water. Negative Presssure Wound Therapy Wound Vac to wound continuously at 120mm/hg pressure Black Foam Home Health No change in wound care orders this week; continue Home Health for wound care. May utilize formulary equivalent dressing for wound treatment orders unless otherwise specified. Other Home Health Orders/Instructions: - Interim Wound Treatment Wound #3 - Amputation Site - Above Knee Wound Laterality: Left, Medial Cleanser: Wound Cleanser 1 x Per Day/15 Days Discharge Instructions: Cleanse the wound with wound cleanser prior to applying a clean dressing using gauze sponges, not tissue or cotton balls. Prim Dressing: Saline moistened gauze 1 x Per Day/15  Days ary Discharge Instructions: Apply saline moistened gauze in clinic Secondary Dressing: ComfortFoam Border, 6x6 in (silicone border) 1 x Per Day/15 Days Discharge  Instructions: Apply over primary dressing in clinic Electronic Signature(s) Signed: 01/23/2021 5:03:34 PM By: Worthy Keeler PA-C Signed: 01/23/2021 6:09:51 PM By: Baruch Gouty RN, [name] Entered By: Baruch Gouty on 01/23/2021 16:55:42 -------------------------------------------------------------------------------- Problem List Details Patient Name: Date of Service: Mike [name] S. 01/23/2021 3:30 PM Medical Record Number: [id number] Patient Account Number: [id number] Date of Birth/Sex: Treating RN: Aug 29, 1961 (60 y.o. [name] Primary Care Provider: PA Haig Prophet, [place] Other Clinician: Referring Provider: Treating Provider/Extender: Mardella Layman, Manish Weeks in Treatment: 8 Active Problems ICD-10 Encounter Code Description Active Date MDM Diagnosis [id number] Acquired absence of left leg above knee 01/16/2021 No Yes T81.31XA Disruption of external operation (surgical) wound, not elsewhere classified, 12/19/2020 No Yes initial encounter I25.10 Atherosclerotic heart disease of native coronary artery without angina pectoris 11/28/2020 No Yes I73.89 Other specified peripheral vascular diseases 11/28/2020 No Yes I10 Essential (primary) hypertension 11/28/2020 No Yes Inactive Problems Resolved Problems ICD-10 Code Description Active Date Resolved Date L89.323 Pressure ulcer of left buttock, stage 3 11/28/2020 11/28/2020 Electronic Signature(s) Signed: 01/23/2021 3:41:47 PM By: Worthy Keeler PA-C Entered By: Worthy Keeler on 01/23/2021 15:41:46 -------------------------------------------------------------------------------- Progress Note Details Patient Name: Date of Service: Mike [name] HN S. 01/23/2021 3:30 PM Medical Record Number: [id number] Patient Account Number: [id number] Date of  Birth/Sex: Treating RN: September 08, 1961 (60 y.o. [name] Primary Care Provider: PA Haig Prophet, [place] Other Clinician: Referring Provider: Treating Provider/Extender: Mardella Layman, Manish Weeks in Treatment: 8 Subjective Chief Complaint Information obtained from Patient Left gluteal pressure ulcer History of Present Illness (HPI) 11/28/2020 patient presents for initial evaluation here in our clinic concerning issues that he has been having with wounds actually in the left gluteal region. These occurred following his surgery and he actually had surgery which included a left above-knee amputation. Unfortunately this was necessitated by the fact that the patient had a fairly complicated recent vascular and cardiac history including a STEMI on 08/17/2020. This was complicated by cardiac arrest secondary to chronic ST elevations in the inferior leads, severe bilateral renal artery stenosis, peripheral arterial disease with critical limb ischemia requiring right iliac revascularization of the left lower extremity, chronic congestive heart failure systolic with ischemic cardiomyopathy with complaints of shortness of breath, decreased ability to walk due to severe leg pain and again the patient had acute critical limb ischemia. He was too high of a risk being that he was on aspirin and Brilinta due to the recent STEMI and therefore was not a candidate for revascularization. Because he was decompensating so quickly it was elected to do an above-knee amputation to treat the progressing gangrene prior to this worsening significantly. This was done up by Dr. Donnetta Hutching on November 06, 2020. He did have a Foley catheter during the time that he was in the hospital. He was there from October 30, 2020 through November 15, 2020. It was during this time that he developed the wounds in the gluteal region on the left. The wounds appear to be in somewhat of a linear pattern that makes me in the suspect that he may  have developed these as a result of having sat on a tubing such as a catheter tubing or something of the sort for too long causing a pressure injury although I cannot know that for sure. Not this far out. Currently the patient is on aspirin and Plavix and is currently in skilled nursing at [facility]. 12/19/2020 upon evaluation today patient appears to be doing well with regard  to his gluteal wounds are completely healed. That is great news. The main issue is actually with his above-knee amputation stump on the left. He unfortunately is having trouble here with getting this to heal part of the medial aspect is eschar covered and I think this is good have to clear away in order to see this improve. I do believe however that we can initiate Santyl to try to help loosen this up in the future we should be able to debride this away much more effectively and quickly. 01/09/2021 upon evaluation today patient appears to be doing well with regard to his wound currently. He actually appears to be doing quite well and I do believe the Annitta Needs has been beneficial currently. There is much looser necrotic tissue noted in the wound bed we are going to need to clean this out I do think a wound VAC may likely be ideal for him in the situation 01/16/2021 upon evaluation today patient appears to be doing well with regard to his wound. In fact the slough seems to be loosening up quite significantly today and the Santyl has really been doing a good job. He has gotten approval for the wound VAC and that should be delivered hopefully tomorrow 01/23/2021 on evaluation today patient actually appears to be doing quite well in regard to his wound. I do believe the wound VAC has done extremely well for him he said that since Friday and already just over the weekend he has noticed a lot of improvement even the home health nurse was surprised by how much improvement. There does not appear to be any evidence of active infection at  this time. No fevers, chills, nausea, vomiting, or diarrhea. Objective Constitutional Well-nourished and well-hydrated in no acute distress. Vitals Time Taken: 4:24 PM, Height: 72 in, Weight: 135 lbs, BMI: 18.3, Temperature: 98.3 F, Pulse: 71 bpm, Respiratory Rate: 16 breaths/min, Blood Pressure: 138/88 mmHg. Respiratory normal breathing without difficulty. Psychiatric this patient is able to make decisions and demonstrates good insight into disease process. Alert and Oriented x 3. pleasant and cooperative. General Notes: Inspection patient's wound did not require sharp debridement I did clean this just with saline and gauze mechanically he tolerated that without complication post debridement the wound bed appears to be very clean and looks well. Integumentary (Hair, Skin) Wound #3 status is Open. Original cause of wound was Surgical Injury. The wound is located on the Left,Medial Amputation Site - Above Knee. The wound measures 3.1cm length x 1.5cm width x 0.7cm depth; 3.652cm^2 area and 2.556cm^3 volume. There is Fat Layer (Subcutaneous Tissue) exposed. There is no tunneling or undermining noted. There is a large amount of serosanguineous drainage noted. The wound margin is well defined and not attached to the wound base. There is large (67-100%) pink granulation within the wound bed. There is a small (1-33%) amount of necrotic tissue within the wound bed including Adherent Slough. Assessment Active Problems ICD-10 Acquired absence of left leg above knee Disruption of external operation (surgical) wound, not elsewhere classified, initial encounter Atherosclerotic heart disease of native coronary artery without angina pectoris Other specified peripheral vascular diseases Essential (primary) hypertension Plan Follow-up Appointments: Return Appointment in 1 week. Bathing/ Shower/ Hygiene: May shower and wash wound with soap and water. Negative Presssure Wound Therapy: Wound Vac to  wound continuously at 17mm/hg pressure Black Foam Home Health: No change in wound care orders this week; continue Home Health for wound care. May utilize formulary equivalent dressing for wound treatment orders unless  otherwise specified. Other Home Health Orders/Instructions: - Interim WOUND #3: - Amputation Site - Above Knee Wound Laterality: Left, Medial Cleanser: Wound Cleanser 1 x Per Day/15 Days Discharge Instructions: Cleanse the wound with wound cleanser prior to applying a clean dressing using gauze sponges, not tissue or cotton balls. Prim Dressing: Saline moistened gauze 1 x Per Day/15 Days ary Discharge Instructions: Apply saline moistened gauze in clinic Secondary Dressing: ComfortFoam Border, 6x6 in (silicone border) 1 x Per Day/15 Days Discharge Instructions: Apply over primary dressing in clinic Number I would recommend that we going to continue with the wound VAC I think the negative pressure wound therapy is the right thing to do and does seem to be making a excellent improvement for him. 2. I am also can recommend that the patient continue to monitor for any signs of infection or anything worsening if he develops any issues he should let me know soon as possible. We will see patient back for reevaluation in 2 weeks here in the clinic. If anything worsens or changes patient will contact our office for additional recommendations. Electronic Signature(s) Signed: 01/23/2021 4:54:53 PM By: Worthy Keeler PA-C Entered By: Worthy Keeler on 01/23/2021 16:54:53 -------------------------------------------------------------------------------- SuperBill Details Patient Name: Date of Service: Mike [name] S. 01/23/2021 Medical Record Number: [id number] Patient Account Number: [id number] Date of Birth/Sex: Treating RN: October 31, 1961 (60 y.o. [name] Primary Care Provider: PA Haig Prophet, [place] Other Clinician: Referring Provider: Treating Provider/Extender: Mardella Layman, Manish Weeks in Treatment: 8 Diagnosis Coding ICD-10 Codes Code Description [phone] Acquired absence of left leg above knee T81.31XA Disruption of external operation (surgical) wound, not elsewhere classified, initial encounter I25.10 Atherosclerotic heart disease of native coronary artery without angina pectoris I73.89 Other specified peripheral vascular diseases I10 Essential (primary) hypertension Facility Procedures CPT4 Code: [id number] Description: 99213 - WOUND CARE VISIT-LEV 3 EST PT Modifier: Quantity: 1 Physician Procedures : CPT4 Code Description Modifier [id number] - WC PHYS LEVEL 4 - EST PT ICD-10 Diagnosis Description Z89.612 Acquired absence of left leg above knee T81.31XA Disruption of external operation (surgical) wound, not elsewhere classified, initial encounter  I25.10 Atherosclerotic heart disease of native coronary artery without angina pectoris I73.89 Other specified peripheral vascular diseases Quantity: 1 Electronic Signature(s) Signed: 01/23/2021 4:55:11 PM By: Worthy Keeler PA-C Entered By: Worthy Keeler on 01/23/2021 16:55:10

## 2021-01-24 ENCOUNTER — Other Ambulatory Visit: Payer: Self-pay | Admitting: Cardiology

## 2021-01-24 ENCOUNTER — Encounter: Payer: Self-pay | Admitting: Cardiology

## 2021-01-24 ENCOUNTER — Ambulatory Visit: Payer: BC Managed Care – PPO | Admitting: Cardiology

## 2021-01-24 VITALS — BP 135/72 | HR 69 | Ht 72.0 in | Wt 140.0 lb

## 2021-01-24 DIAGNOSIS — I340 Nonrheumatic mitral (valve) insufficiency: Secondary | ICD-10-CM

## 2021-01-24 DIAGNOSIS — I2510 Atherosclerotic heart disease of native coronary artery without angina pectoris: Secondary | ICD-10-CM | POA: Diagnosis not present

## 2021-01-24 DIAGNOSIS — I739 Peripheral vascular disease, unspecified: Secondary | ICD-10-CM

## 2021-01-24 DIAGNOSIS — I5020 Unspecified systolic (congestive) heart failure: Secondary | ICD-10-CM | POA: Diagnosis not present

## 2021-01-24 DIAGNOSIS — I6340 Cerebral infarction due to embolism of unspecified cerebral artery: Secondary | ICD-10-CM

## 2021-01-24 DIAGNOSIS — T8189XA Other complications of procedures, not elsewhere classified, initial encounter: Secondary | ICD-10-CM | POA: Diagnosis not present

## 2021-01-24 DIAGNOSIS — I280 Arteriovenous fistula of pulmonary vessels: Secondary | ICD-10-CM

## 2021-01-24 DIAGNOSIS — Q2730 Arteriovenous malformation, site unspecified: Secondary | ICD-10-CM

## 2021-01-24 IMAGING — CT CT ANGIO CHEST
2 of 6 series · 18 of 46 positions shown · IV contrast (omnipaque)
Comparison: None.

CLINICAL DATA: Inpatient. Multifocal embolic stroke. Strongly
positive bubble study on IGGY today with small patent foramen ovale.
Evaluate for pulmonary AV fistula.

EXAM:
CT ANGIOGRAPHY CHEST WITH CONTRAST
TECHNIQUE: Multidetector CT imaging of the chest was performed using the
standard protocol during bolus administration of intravenous
contrast. Multiplanar CT image reconstructions and MIPs were
obtained to evaluate the vascular anatomy.
CONTRAST:  75mL OMNIPAQUE IOHEXOL 350 MG/ML SOLN

[Series 6: thins · axial · 0.72mm/px · z∈[-430,-169]mm · 15 of 287 slices shown]
[im 13/287  lung]
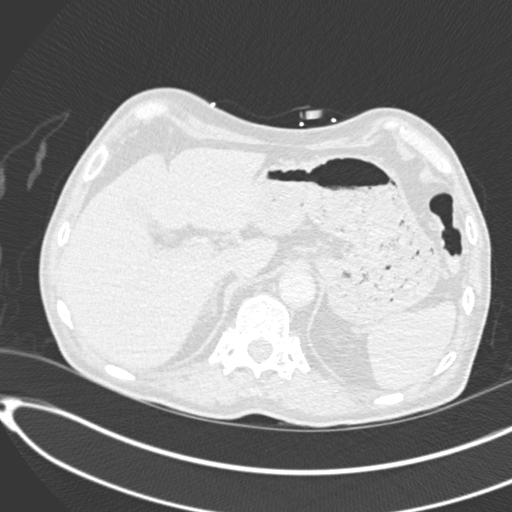
[im 38/287  soft-tissue]
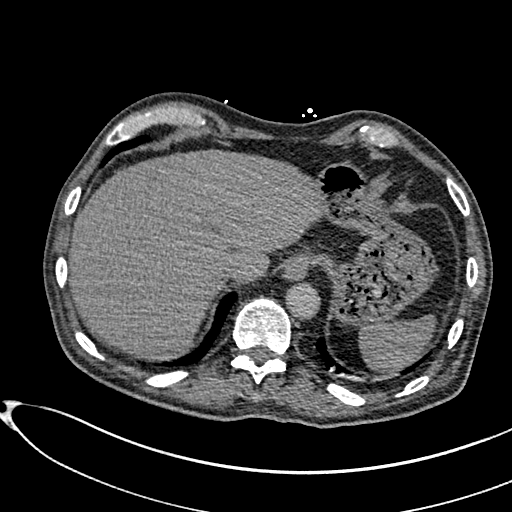
[im 50/287  lung]
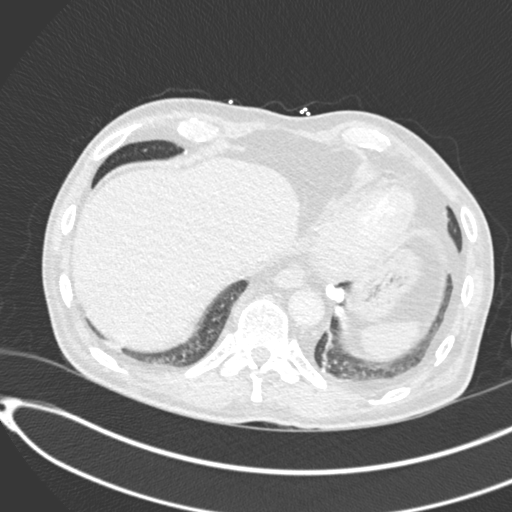
[im 75/287  soft-tissue]
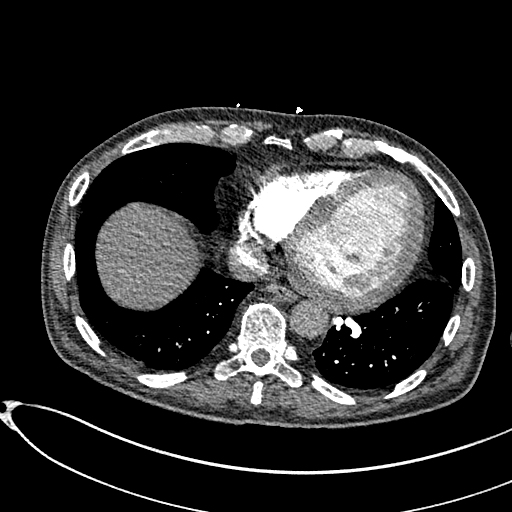
[im 88/287  lung]
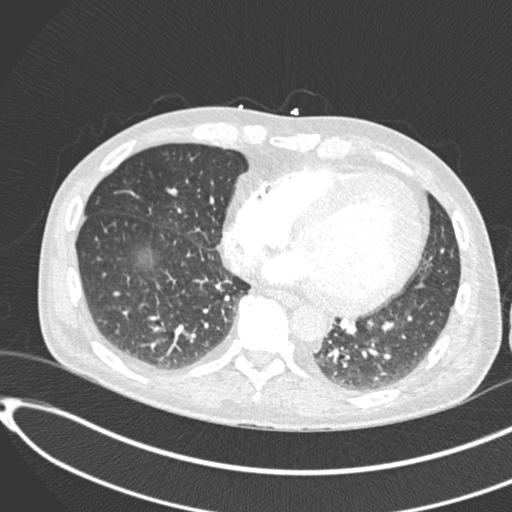
[im 112/287  soft-tissue]
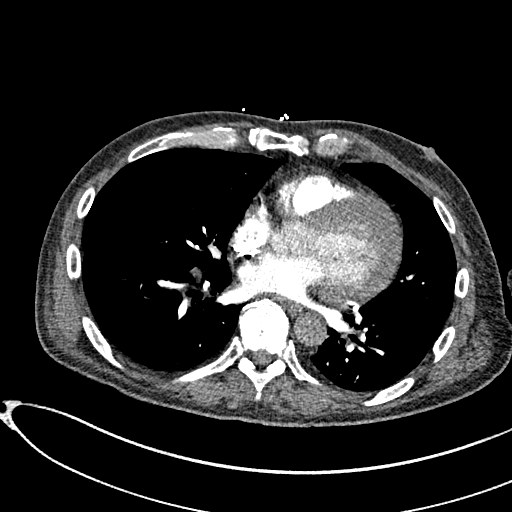
[im 125/287  lung]
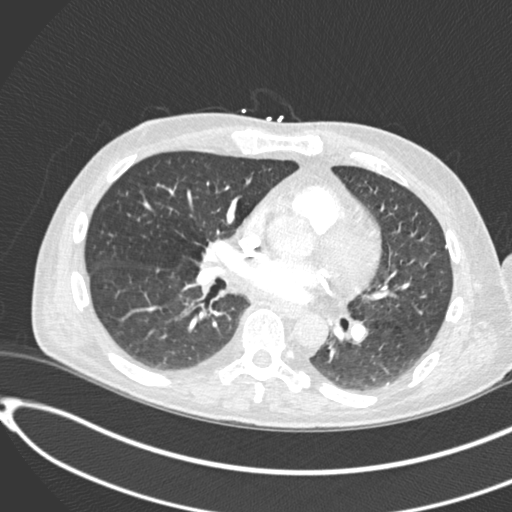
[im 150/287  soft-tissue]
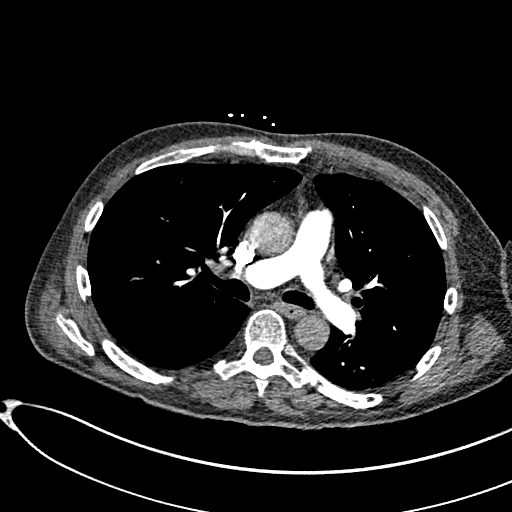
[im 162/287  lung]
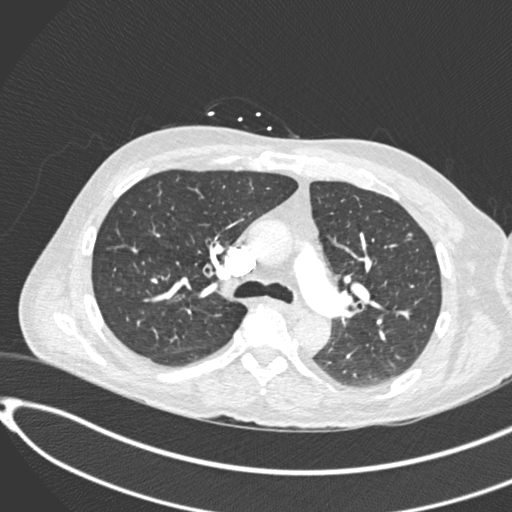
[im 175/287  soft-tissue]
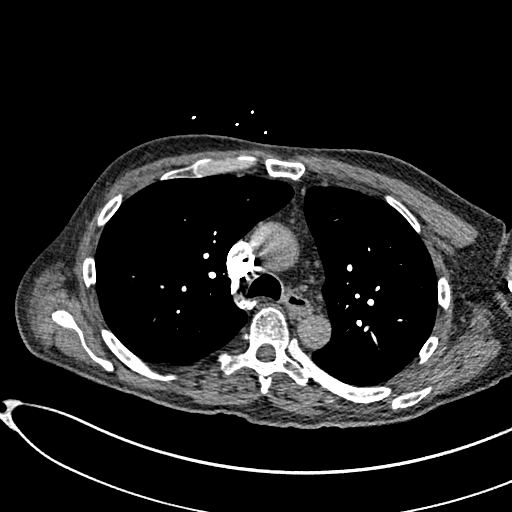
[im 199/287  lung]
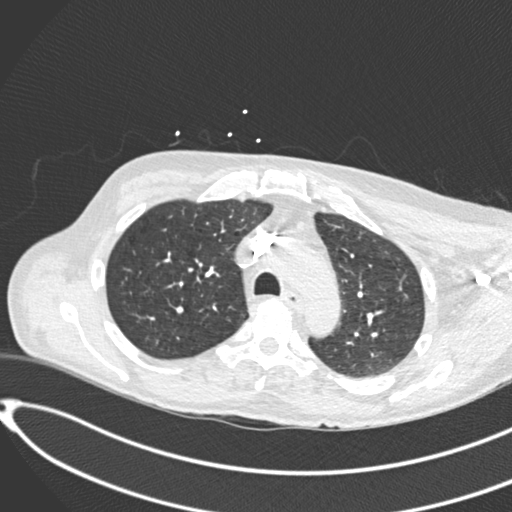
[im 212/287  soft-tissue]
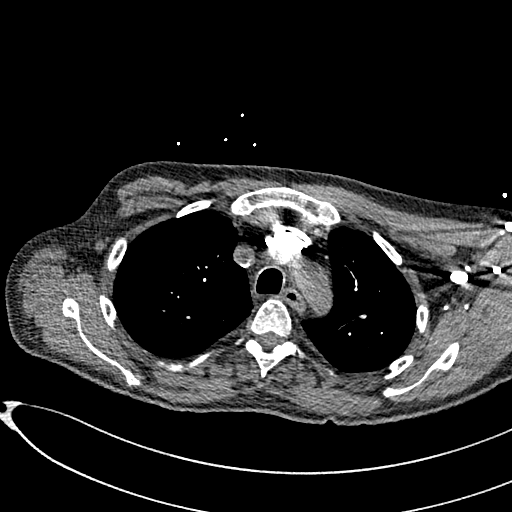
[im 237/287  lung]
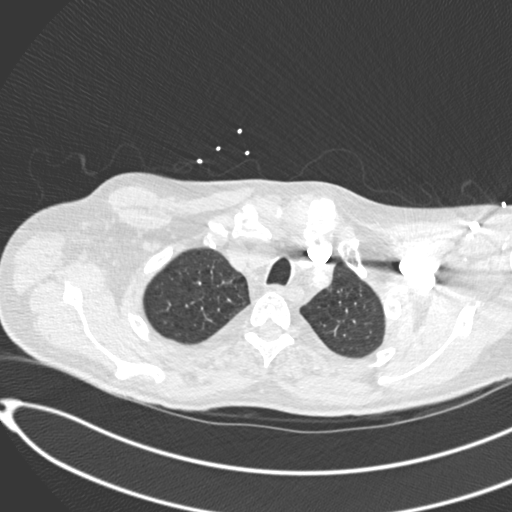
[im 249/287  soft-tissue]
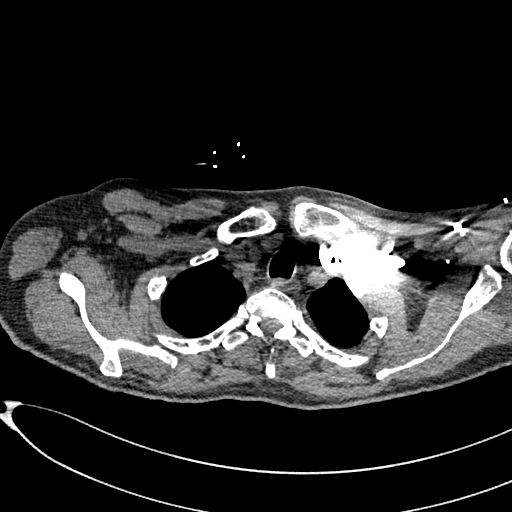
[im 274/287  lung]
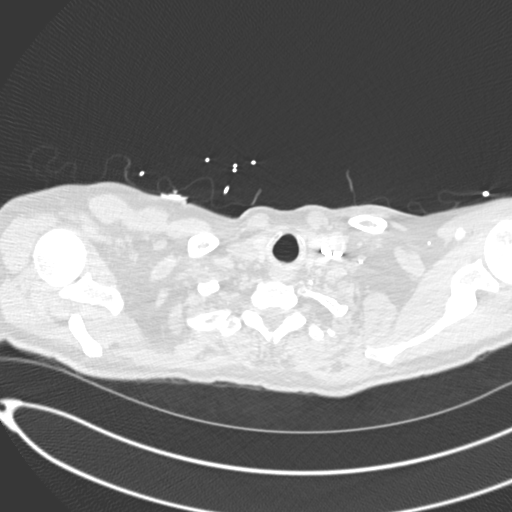

[Series 8: coronal mpr · coronal · 0.59mm/px · 3 of 116 slices shown]
[im 29/116  soft-tissue]
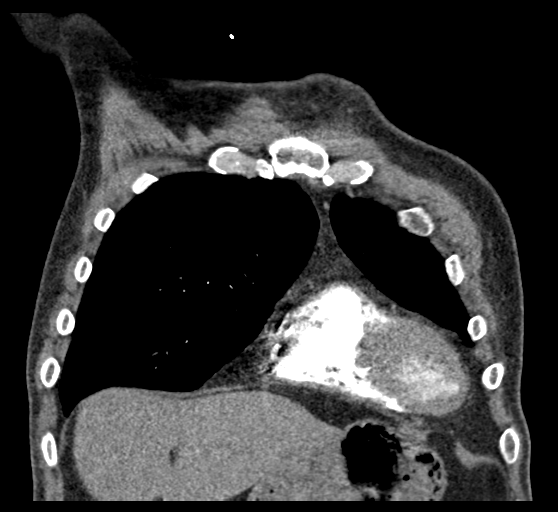
[im 58/116  soft-tissue]
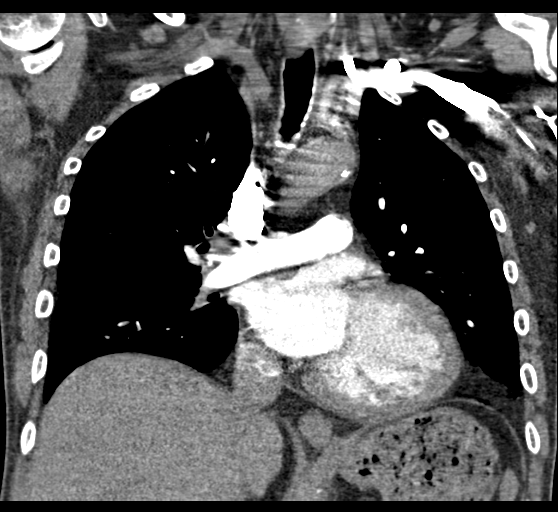
[im 87/116  soft-tissue]
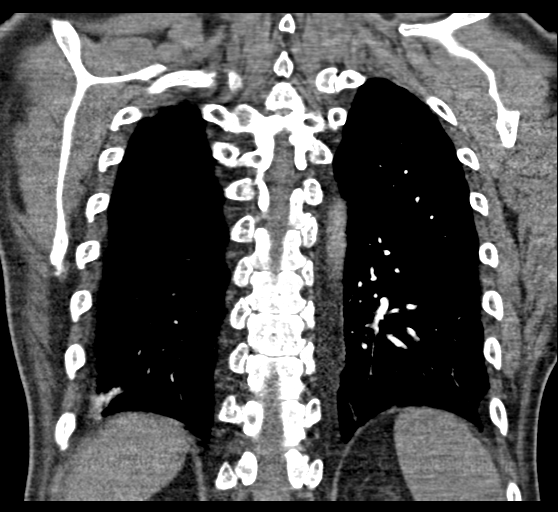

[18 of 46 positions shown; findings below may reference images not displayed]

FINDINGS: Cardiovascular: Mild cardiomegaly. No significant pericardial
effusion/thickening. Three-vessel coronary atherosclerosis.
Atherosclerotic nonaneurysmal thoracic aorta. Normal caliber
pulmonary arteries. No pulmonary emboli. There is a large pulmonary
arteriovenous fistula in the medial left lower lobe measuring 3.1 x
1.9 cm (series 6/image 230 and series 8/images 68-77).

Mediastinum/Nodes: No discrete thyroid nodules. Unremarkable
esophagus. No pathologically enlarged axillary, mediastinal or hilar
lymph nodes.

Lungs/Pleura: No pneumothorax. No pleural effusion. Mild
centrilobular emphysema. Right middle lobe 3 mm solid pulmonary
nodule (series 7/image 59). No acute consolidative airspace disease,
lung masses or additional significant pulmonary nodules.

Upper abdomen: No acute abnormality.

Musculoskeletal: No aggressive appearing focal osseous lesions. Mild
thoracic spondylosis.

Review of the MIP images confirms the above findings.
IMPRESSION: 1. Large 3.1 x 1.9 cm pulmonary arteriovenous fistula in the medial
left lower lobe.
2. Right middle lobe 3 mm solid pulmonary nodule. Follow-up
noncontrast chest CT recommended in 12 months in this high risk
patient.This recommendation follows the consensus statement:
Guidelines for Management of Incidental Pulmonary Nodules Detected
3. Mild cardiomegaly.
4. Aortic Atherosclerosis (ZLADX-E5P.P) and Emphysema (ZLADX-2OH.L).

## 2021-01-24 MED ORDER — ENTRESTO 24-26 MG PO TABS
1.0000 | ORAL_TABLET | Freq: Two times a day (BID) | ORAL | 1 refills | Status: DC
Start: 2021-01-24 — End: 2021-02-20

## 2021-01-24 NOTE — Progress Notes (Addendum)
Follow up visit  Subjective:   Mike [name], male    DOB: Mar 04, 1961, 60 y.o.   MRN: [id number]     HPI   Chief Complaint  Patient presents with  . heart failure with reduced ejection fraction  . Follow-up  . Results  . pt needs refils    60 y.o.Caucasian male with CAD, culprit (RCA) and nonculprit (LCx) PCI after STEMI 07/2020, HFrEF, moderate MR, h/o cardiac arrest 2/2 Torsades post PCI during index hospitalization, PAD with critical limb ischemia s/p Rt iliac revascularization, and s/p Lt AKA amputation for critical limb ischemia,  severe bilateral renal artery stenoses w/h/o CIN (09/2020), multifocal stroke s/p tPA with resolution of neurodeficit (11/2020), large AV pulmonary fistula and small PFO.  Recent echocardiogram in 12/2020 showed IF marginally improved to 35-40%, basal inferior akinesis and moderate to severe secondary mitral regurgitation. Recent cardiac telemetry results also discussed with the patient, details below.   Patient has been doing well, and denies chest pain, shortness of breath, palpitations, leg edema, orthopnea, PND, TIA/syncope. He has regular follow up with wound care. He currently using a wound vac, with which the healing of amputation site has been satisfactory.    Current Outpatient Medications on File Prior to Visit  Medication Sig Dispense Refill  . aspirin EC 81 MG tablet Take 1 tablet (81 mg total) by mouth daily. Swallow whole. 90 tablet 0  . clopidogrel (PLAVIX) 75 MG tablet Take 1 tablet (75 mg total) by mouth daily. 30 tablet 0  . furosemide (LASIX) 20 MG tablet Take 1 tablet (20 mg total) by mouth daily. 30 tablet 0  . gabapentin (NEURONTIN) 300 MG capsule Take 1 capsule (300 mg total) by mouth 2 (two) times daily. (Patient taking differently: Take 300-600 mg by mouth See admin instructions. Take 1 capsule (300 mg totally) by mouth in the morning; take 2 capsules (600 mg totally) by mouth at bed time) 60 capsule 0  . metoprolol  succinate (TOPROL-XL) 25 MG 24 hr tablet Take 1 tablet (25 mg total) by mouth daily. Take with or immediately following a meal. 90 tablet 3  . potassium chloride (KLOR-CON) 10 MEQ tablet Take 1 tablet (10 mEq total) by mouth daily. 30 tablet 0  . rivaroxaban (XARELTO) 2.5 MG TABS tablet Take 1 tablet (2.5 mg total) by mouth 2 (two) times daily. 60 tablet 3  . rosuvastatin (CRESTOR) 10 MG tablet Take 1 tablet (10 mg total) by mouth daily. 30 tablet 0  . nitroGLYCERIN (NITROSTAT) 0.4 MG SL tablet Place 1 tablet (0.4 mg total) under the tongue every 5 (five) minutes as needed for chest pain. (Patient taking differently: Place 0.4 mg under the tongue every 5 (five) minutes as needed for chest pain (max 3 doses).) 30 tablet 1   No current facility-administered medications on file prior to visit.    Cardiovascular & other pertient studies:  Mobile cardiac telemetry 14 days 12/20/2020 - 01/03/2021:  Dominant rhythm: Sinus.  HR 31-167 bpm. Avg HR 76 bpm.  14 episodes of SVT/atrial tachycardia, fastest at 167 bpm, longest for 18.7 sec  <1%% isolated SVE, couplet/triplets.  6 episodes of VT/idioventricular rhythm, fastest at 156 bpm, longest for 10 beats  8% isolated VE, 40 sec bigeminy, 63m17 sec trigeminy.  2 episodes of sinus pauses, 4-4.6 sec noted during sleep hours.  No atrial fibrillation/atrial flutter//high grade AV block  0 patient triggered events.   Echocardiogram 01/21/2021:  Left ventricle cavity is normal in size. Mild concentric hypertrophy  of  the left ventricle. Left ventricle regional wall motion findings: Basal  inferolateral and Basal inferior akinesis, mild global hypokinesis.   Moderately depressed LV systolic function with visual EF 35-40%. Doppler  evidence of grade I (impaired) diastolic dysfunction, normal LAP.  Left atrial cavity is moderately dilated.  Structurally normal mitral valve. No evidence of mitral stenosis. Moderate  to severe secondary mitral regurgitation  due basal inferior akinesis.  Mild tricuspid regurgitation.  No evidence of pulmonary hypertension.  Compared to previous study on 09/11/2020, LVEF appears improved from  15-20%. Mitral regurgitation is increased in severity from mild to  moderate.   EKG 12/20/2020: Sinus rhythm 82 bpm Old inferior infarct Nonspecific ST depression   CTA Chest 12/14/2020: 1. Large 3.1 x 1.9 cm pulmonary arteriovenous fistula in the medial left lower lobe. 2. Right middle lobe 3 mm solid pulmonary nodule. Follow-up noncontrast chest CT recommended in 12 months in this high risk patient.This recommendation follows the consensus statement: Guidelines for Management of Incidental Pulmonary Nodules Detected on CT Images: From the Fleischner Society 2017; Radiology 2017; 284:228-243. 3. Mild cardiomegaly. 4. Aortic Atherosclerosis (ICD10-I70.0) and Emphysema (ICD10-J43.9).   PV intervention 10/16/2020: Successful intravascular lithotripsy, PTCA and stenting 7.0X39 mm balloon expandable Viabahn VBX stent Rt common iliac artery  7.0X60 mm self expanding Absolute Pro stent Rt distal iliac artery  No named vessels at ankle level in both lower extremities.  Will consult vascular surgery for Rt fem-to-left profunda bypass given left foot critical limb ischemia.  Abdominal Aortic Duplex 09/21/2020:  Moderate plaque noted in the proximal, mid and distal aorta. There is an  ulcerated plaque noted in the distal abdominal aorta. No AAA. Normal iliac  artery velocity.  Lower Extremity Arterial Duplex 09/21/2020:  The right SFA is occluded in the proximal segment with reconstitution at  the level of the popliteal artery with diffuse monophasic waveform below  the knee. Right profunda femoral artery has >50% stenosis. There is  moderate mixed plaque noted throughout the right lower extremity.  Monophasic waveform throughout the left lower extremity, indicates  significant proximal disease (iliac  artery).   Left SFA is occluded in the proximal segment and reconstitutes just above popliteal artery and diffuse dampened monophasic waveform throughout the lower extremity below the  knee. There is moderate mixed plaque throughout the left lower extremity.   This exam reveals severely decreased perfusion of the right lower  extremity, noted at the dorsalis pedis artery level (ABI 0.42) and  critically decreased perfusion of the left lower extremity, noted at the  dorsalis pedis and post tibial artery level (ABI 0.03).   Coronary intervention 08/03/2020: LM: Distal 40% stenosis LAD: Mid 30% disease LCx: Subtotally occluded OM2, prox LCx 70% stenosis Successful percutaneous coronary intervention OM2-Prox LCx PTCA and overlapping stents placement  2.5 X 38 mm and 2.5 X 18 mm Resolute Onyx drug-eluting stents 100%--->0% stenosis. TIMI flow 0-->III Small caliber distal vessel with moderate diffuse disease RCA: Prox 30% stenosis. Patent mid RCA sttent 2.5 X 26 mm Resolute Onyx drug-eluting stent  LVEDP 42 mmHg  Coronary intervention 08/01/2020: LM: Distal 30% stenosis LAD: Mid 30% disease LCx: Subtotally occluded OM2, bridging left-to-left and right-to-left collaterals RCA: Prox 30% stenosis. Mid 100% occlusion Successful percutaneous coronary intervention mid RCA PTCA and stent placement 2.5 X 26 mm Resolute Onyx drug-eluting stent 100%--->0% stenosis. TIMI flow 0-->III   Recent labs: 12/14/2020: Glucose 100, BUN/Cr 13/1.09. EGFR >60. Na/K 140/3.9.  H/H 11.6/35.5. MCV 90.6. Platelets 202 HbA1C 4.6% Chol 130,  TG 71, HDL 42, LDL 74  09/25/2020: Glucose 102, BUN/Cr 18/1.16. EGFR 68. Na/K 140/4.5.  NT pro BNP 2404  09/11/2020: Glucose 114, BUN/Cr 19/1.42. EGFR 53. Na/K 136/4.7.  Chol 95, TG 76, HDL 30, LDL 49  08/04/2020: Glucose 101, BUN/Cr 19/1.08. EGFR >60. Na/K 139/4.0. Rest of the CMP normal H/H 14/42. MCV  89. Platelets 184. WBC count 14k HbA1C 5.8% Chol 178, TG 75, HDL 40, LDL 123    Review of Systems  Cardiovascular: Negative for chest pain, claudication, dyspnea on exertion, leg swelling, palpitations and syncope.  Respiratory: Negative for shortness of breath.          Vitals:   01/24/21 1022  BP: 135/72  Pulse: 69  SpO2: 97%     Body mass index is 18.99 kg/m. Filed Weights   01/24/21 1022  Weight: 140 lb (63.5 kg)     Objective:   Physical Exam Vitals and nursing note reviewed.  Constitutional:      General: He is not in acute distress. Neck:     Vascular: No JVD.  Cardiovascular:     Rate and Rhythm: Normal rate and regular rhythm.     Pulses:          Femoral pulses are 1+ on the right side and 1+ on the left side.      Dorsalis pedis pulses are 1+ on the right side and 1+ on the left side.       Posterior tibial pulses are 0 on the right side and 0 on the left side.     Heart sounds: Normal heart sounds. No murmur heard.   Pulmonary:     Effort: Pulmonary effort is normal.     Breath sounds: Normal breath sounds. No wheezing or rales.  Musculoskeletal:     Right lower leg: Edema (Trace) present.           Assessment & Recommendations:   60 y.o.Caucasian male with CAD, culprit (RCA) and nonculprit (LCx) PCI after STEMI 07/2020, HFrEF, moderate MR, h/o cardiac arrest 2/2 Torsades post PCI during index hospitalization, PAD with critical limb ischemia s/p Rt iliac revascularization, and s/p Lt AKA amputation for critical limb ischemia,  severe bilateral renal artery stenoses w/h/o CIN (09/2020), stroke s/p tPA with resolution of neurodeficit (11/2020), multifocal stroke on MRI suspicious for cardioembolic source, small PFO/secundum ASD and large pulmonary AV fistula  Stroke: Multifocal stroke with small PFO/secundum ASD but large pulmonary AV fistula (11/2020) I will refer her to interventional radiologist Dr. Kathlene Cote for consideration for closure.  After closure, we could perform TCD or repeat TEE or TTE with bubble study to evaluate any residual shunting.   PAD: Successful revascularization of right common iliac and right external iliac artery (09/2020) S/p Lt AKA. Currently has wound vac. Encouraged him to f/u w/Dr. Stephens Shire office re: prosthesis. No critical limb ischemia, although he has pre-ulcerative calloses on right sole.   HFrEF, secondary mitral regurgitation: Euvolumic. EF 35-40%. No ICD indicated. Will re-introduce Entresto 24-26 mg bid. Check BMP in 7-10 days. I would hope to up tirate Entresto to up to 49-51 mg bid Conitnue metoprolol succinate 25 mg daily. He has had sinus pauses at night, but no asymptomatic pauses during daytime. Will recheck echocardiogram in 05/2021.   CAD: S/p STEMI 07/2020. Currently no angina symptoms Conitnue Aspirin/plavix, Crestor. Given severe CAD, PAD, added Xarelto 2.5 mg bid (COMPASS trial). Will give samples Continue Crestor 20 mg  F/u in 2-3 weeks  Time spent: 45 min  Nigel Mormon, MD Pager: [phone] Office: [phone]

## 2021-01-24 NOTE — Progress Notes (Signed)
DANNEL, RAFTER ([id number]) Visit Report for 01/23/2021 Arrival Information Details Patient Name: Date of Service: Mike [name] 01/23/2021 3:30 PM Medical Record Number: [id number] Patient Account Number: [id number] Date of Birth/Sex: Treating RN: [birth year], Mike [name] (60 y.o. Mike [name] Primary Care Provider: PA Haig Prophet, NO Other Clinician: Referring Provider: Treating Provider/Extender: Mardella Layman, Manish Weeks in Treatment: 8 Visit Information History Since Last Visit Added or deleted any medications: No Patient Arrived: Wheel Chair Any new allergies or adverse reactions: No Arrival Time: 16:22 Had a fall or experienced change in No Accompanied By: wife activities of daily living that may affect Transfer Assistance: None risk of falls: Patient Identification Verified: Yes Signs or symptoms of abuse/neglect since last visito No Secondary Verification Process Completed: Yes Hospitalized since last visit: No Patient Requires Transmission-Based Precautions: No Implantable device outside of the clinic excluding No Patient Has Alerts: Yes cellular tissue based products placed in the center Patient Alerts: Patient on Blood Thinner since last visit: Has Dressing in Place as Prescribed: Yes Pain Present Now: No Electronic Signature(s) Signed: 01/24/2021 3:17:01 PM By: Sandre Kitty Entered By: Sandre Kitty on 01/23/2021 16:24:28 -------------------------------------------------------------------------------- Clinic Level of Care Assessment Details Patient Name: Date of Service: Mike [name] 01/23/2021 3:30 PM Medical Record Number: [id number] Patient Account Number: [id number] Date of Birth/Sex: Treating RN: Mike [name][birth year] (60 y.o. Mike [name] Primary Care Provider: PA TIENT, NO Other Clinician: Referring Provider: Treating Provider/Extender: Mardella Layman, Manish Weeks in Treatment: 8 Clinic Level of Care Assessment  Items TOOL 4 Quantity Score [] - 0 Use when only an EandM is performed on FOLLOW-UP visit ASSESSMENTS - Nursing Assessment / Reassessment X- 1 10 Reassessment of Co-morbidities (includes updates in patient status) X- 1 5 Reassessment of Adherence to Treatment Plan ASSESSMENTS - Wound and Skin A ssessment / Reassessment X - Simple Wound Assessment / Reassessment - one wound 1 5 [] - 0 Complex Wound Assessment / Reassessment - multiple wounds [] - 0 Dermatologic / Skin Assessment (not related to wound area) ASSESSMENTS - Focused Assessment [] - 0 Circumferential Edema Measurements - multi extremities [] - 0 Nutritional Assessment / Counseling / Intervention [] - 0 Lower Extremity Assessment (monofilament, tuning fork, pulses) [] - 0 Peripheral Arterial Disease Assessment (using hand held doppler) ASSESSMENTS - Ostomy and/or Continence Assessment and Care [] - 0 Incontinence Assessment and Management [] - 0 Ostomy Care Assessment and Management (repouching, etc.) PROCESS - Coordination of Care X - Simple Patient / Family Education for ongoing care 1 15 [] - 0 Complex (extensive) Patient / Family Education for ongoing care X- 1 10 Staff obtains Programmer, systems, Records, T Results / Process Orders est X- 1 10 Staff telephones HHA, Nursing Homes / Clarify orders / etc [] - 0 Routine Transfer to another Facility (non-emergent condition) [] - 0 Routine Hospital Admission (non-emergent condition) [] - 0 New Admissions / Biomedical engineer / Ordering NPWT Apligraf, etc. , [] - 0 Emergency Hospital Admission (emergent condition) X- 1 10 Simple Discharge Coordination [] - 0 Complex (extensive) Discharge Coordination PROCESS - Special Needs [] - 0 Pediatric / Minor Patient Management [] - 0 Isolation Patient Management [] - 0 Hearing / Language / Visual special needs [] - 0 Assessment of Community assistance (transportation, D/C planning, etc.) [] - 0 Additional  assistance / Altered mentation [] - 0 Support Surface(s) Assessment (bed, cushion, seat, etc.) INTERVENTIONS - Wound Cleansing / Measurement [] - 0 Simple Wound Cleansing - one  wound [] - 0 Complex Wound Cleansing - multiple wounds [] - 0 Wound Imaging (photographs - any number of wounds) [] - 0 Wound Tracing (instead of photographs) [] - 0 Simple Wound Measurement - one wound [] - 0 Complex Wound Measurement - multiple wounds INTERVENTIONS - Wound Dressings X - Small Wound Dressing one or multiple wounds 1 10 [] - 0 Medium Wound Dressing one or multiple wounds [] - 0 Large Wound Dressing one or multiple wounds X- 1 5 Application of Medications - topical [] - 0 Application of Medications - injection INTERVENTIONS - Miscellaneous [] - 0 External ear exam [] - 0 Specimen Collection (cultures, biopsies, blood, body fluids, etc.) [] - 0 Specimen(s) / Culture(s) sent or taken to Lab for analysis [] - 0 Patient Transfer (multiple staff / Civil Service fast streamer / Similar devices) [] - 0 Simple Staple / Suture removal (25 or less) [] - 0 Complex Staple / Suture removal (26 or more) [] - 0 Hypo / Hyperglycemic Management (close monitor of Blood Glucose) [] - 0 Ankle / Brachial Index (ABI) - do not check if billed separately X- 1 5 Vital Signs Has the patient been seen at the hospital within the last three years: Yes Total Score: 85 Level Of Care: New/Established - Level 3 Electronic Signature(s) Signed: 01/23/2021 6:09:51 PM By: Baruch Gouty RN, [name] Entered By: Baruch Gouty on 01/23/2021 16:43:[name] -------------------------------------------------------------------------------- Encounter Discharge Information Details Patient Name: Date of Service: Mike Dike HN S. 01/23/2021 3:30 PM Medical Record Number: [id number] Patient Account Number: [id number] Date of Birth/Sex: Treating RN: [birth year] (60 y.o. Mike [name] Primary Care Provider: PA Haig Prophet, NO Other  Clinician: Referring Provider: Treating Provider/Extender: Mardella Layman, Manish Weeks in Treatment: 8 Encounter Discharge Information Items Discharge Condition: Stable Ambulatory Status: Wheelchair Discharge Destination: Home Transportation: Private Auto Accompanied By: sister Schedule Follow-up Appointment: Yes Clinical Summary of Care: Patient Declined Electronic Signature(s) Signed: 01/23/2021 6:09:51 PM By: Baruch Gouty RN, [name] Entered By: Baruch Gouty on 01/23/2021 16:53:31 -------------------------------------------------------------------------------- Lower Extremity Assessment Details Patient Name: Date of Service: Mike Dike HN S. 01/23/2021 3:30 PM Medical Record Number: [id number] Patient Account Number: [id number] Date of Birth/Sex: Treating RN: Mike [name], Mike [name] (60 y.o. Mike [name] Primary Care Provider: PA Haig Prophet, NO Other Clinician: Referring Provider: Treating Provider/Extender: Mardella Layman, Manish Weeks in Treatment: 8 Electronic Signature(s) Signed: 01/23/2021 6:11:14 PM By: Deon Pilling Entered By: Deon Pilling on 01/23/2021 16:30:45 -------------------------------------------------------------------------------- [place] Details Patient Name: Date of Service: Mike Dike HN S. 01/23/2021 3:30 PM Medical Record Number: [id number] Patient Account Number: [id number] Date of Birth/Sex: Treating RN: [birth year] (60 y.o. Mike [name] Primary Care Provider: PA Haig Prophet, NO Other Clinician: Referring Provider: Treating Provider/Extender: Mardella Layman, Manish Weeks in Treatment: 8 Active Inactive Wound/Skin Impairment Nursing Diagnoses: Impaired tissue integrity Knowledge deficit related to ulceration/compromised skin integrity Goals: Patient/caregiver will verbalize understanding of skin care regimen Date Initiated: 11/28/2020 Target Resolution Date: 02/13/2021 Goal Status:  Active Ulcer/skin breakdown will have a volume reduction of 30% by week 4 Date Initiated: 11/28/2020 Date Inactivated: 12/19/2020 Target Resolution Date: 12/26/2020 Goal Status: Met Interventions: Assess patient/caregiver ability to obtain necessary supplies Assess patient/caregiver ability to perform ulcer/skin care regimen upon admission and as needed Assess ulceration(s) every visit Provide education on ulcer and skin care Treatment Activities: Skin care regimen initiated : 11/28/2020 Topical wound management initiated : 11/28/2020 Notes: Electronic Signature(s) Signed: 01/23/2021 6:09:51 PM By: Baruch Gouty RN, [name] Entered By: Johna Roles,  Linda on 01/23/2021 16:37:58 -------------------------------------------------------------------------------- Pain Assessment Details Patient Name: Date of Service: Mike [name]. 01/23/2021 3:30 PM Medical Record Number: [id number] Patient Account Number: [id number] Date of Birth/Sex: Treating RN: Mike [name] (60 y.o. Mike [name] Primary Care Monty Spicher: PA Haig Prophet, NO Other Clinician: Referring Asako Saliba: Treating Dari Carpenito/Extender: Mardella Layman, Manish Weeks in Treatment: 8 Active Problems Location of Pain Severity and Description of Pain Patient Has Paino No Site Locations Pain Management and Medication Current Pain Management: Electronic Signature(s) Signed: 01/23/2021 6:09:51 PM By: Baruch Gouty RN, [name] Signed: 01/24/2021 3:17:01 PM By: Sandre Kitty Entered By: Sandre Kitty on 01/23/2021 16:24:50 -------------------------------------------------------------------------------- Patient/Caregiver Education Details Patient Name: Date of Service: Mike [name] 1/26/2022andnbsp3:30 PM Medical Record Number: [id number] Patient Account Number: [id number] Date of Birth/Gender: Treating RN: [birth year]Mike [name] (60 y.o. Mike [name] Primary Care Physician: PA Haig Prophet, NO Other Clinician: Referring  Physician: Treating Physician/Extender: Mardella Layman, Manish Weeks in Treatment: 8 Education Assessment Education Provided To: Patient Education Topics Provided Wound/Skin Impairment: Methods: Explain/Verbal Responses: Reinforcements needed, State content correctly Electronic Signature(s) Signed: 01/23/2021 6:09:51 PM By: Baruch Gouty RN, [name] Entered By: Baruch Gouty on 01/23/2021 16:38:18 -------------------------------------------------------------------------------- Wound Assessment Details Patient Name: Date of Service: Mike Dike HN S. 01/23/2021 3:30 PM Medical Record Number: [id number] Patient Account Number: [id number] Date of Birth/Sex: Treating RN: Mike [name], Mike [name] (60 y.o. Mike [name] Primary Care Hedi Barkan: PA TIENT, NO Other Clinician: Referring Kelaiah Escalona: Treating Fareed Fung/Extender: Mardella Layman, Manish Weeks in Treatment: 8 Wound Status Wound Number: 3 Primary Dehisced Wound Etiology: Wound Location: Left, Medial Amputation Site - Above Knee Wound Open Wounding Event: Surgical Injury Status: Date Acquired: 11/06/2020 Comorbid Sleep Apnea, Coronary Artery Disease, Hypotension, Myocardial Weeks Of Treatment: 5 History: Infarction, Peripheral Arterial Disease Clustered Wound: No Wound Measurements Length: (cm) 3.1 Width: (cm) 1.5 Depth: (cm) 0.7 Area: (cm) 3.652 Volume: (cm) 2.556 % Reduction in Area: 67.9% % Reduction in Volume: 77.6% Epithelialization: Small (1-33%) Tunneling: No Undermining: No Wound Description Classification: Full Thickness Without Exposed Support Structures Wound Margin: Well defined, not attached Exudate Amount: Large Exudate Type: Serosanguineous Exudate Color: red, brown Foul Odor After Cleansing: No Slough/Fibrino Yes Wound Bed Granulation Amount: Large (67-100%) Exposed Structure Granulation Quality: Pink Fascia Exposed: No Necrotic Amount: Small (1-33%) Fat Layer (Subcutaneous  Tissue) Exposed: Yes Necrotic Quality: Adherent Slough Tendon Exposed: No Muscle Exposed: No Joint Exposed: No Bone Exposed: No Treatment Notes Wound #3 (Amputation Site - Above Knee) Wound Laterality: Left, Medial Cleanser Wound Cleanser Discharge Instruction: Cleanse the wound with wound cleanser prior to applying a clean dressing using gauze sponges, not tissue or cotton balls. Peri-Wound Care Topical Primary Dressing Saline moistened gauze Discharge Instruction: Apply saline moistened gauze in clinic Secondary Dressing ComfortFoam Border, 6x6 in (silicone border) Discharge Instruction: Apply over primary dressing in clinic Secured With Compression Wrap Compression Stockings Add-Ons Electronic Signature(s) Signed: 01/23/2021 6:11:14 PM By: Deon Pilling Entered By: Deon Pilling on 01/23/2021 16:31:22 -------------------------------------------------------------------------------- Vitals Details Patient Name: Date of Service: Mike [place] S. 01/23/2021 3:30 PM Medical Record Number: [id number] Patient Account Number: [id number] Date of Birth/Sex: Treating RN: [birth year] [name] (60 y.o. Mike [name] Primary Care Marchella Hibbard: PA TIENT, NO Other Clinician: Referring Burnie Therien: Treating Quindon Denker/Extender: Mardella Layman, Manish Weeks in Treatment: 8 Vital Signs Time Taken: 16:24 Temperature (F): 98.3 Height (in): 72 Pulse (bpm): 71 Weight (lbs): 135 Respiratory Rate (breaths/min): 16 Body Mass Index (BMI): 18.3 Blood Pressure (mmHg): 138/88 Reference Range: 80 - 120 mg /  dl Electronic Signature(s) Signed: 01/24/2021 3:17:01 PM By: Sandre Kitty Entered By: Sandre Kitty on 01/23/2021 16:24:45

## 2021-01-25 DIAGNOSIS — T8189XA Other complications of procedures, not elsewhere classified, initial encounter: Secondary | ICD-10-CM | POA: Diagnosis not present

## 2021-01-25 DIAGNOSIS — E785 Hyperlipidemia, unspecified: Secondary | ICD-10-CM | POA: Diagnosis not present

## 2021-01-25 DIAGNOSIS — I10 Essential (primary) hypertension: Secondary | ICD-10-CM | POA: Diagnosis not present

## 2021-01-25 DIAGNOSIS — I6309 Cerebral infarction due to thrombosis of other precerebral artery: Secondary | ICD-10-CM | POA: Diagnosis not present

## 2021-01-25 DIAGNOSIS — I739 Peripheral vascular disease, unspecified: Secondary | ICD-10-CM | POA: Diagnosis not present

## 2021-01-25 DIAGNOSIS — Z89612 Acquired absence of left leg above knee: Secondary | ICD-10-CM | POA: Diagnosis not present

## 2021-01-25 DIAGNOSIS — Z720 Tobacco use: Secondary | ICD-10-CM | POA: Diagnosis not present

## 2021-01-25 DIAGNOSIS — M6281 Muscle weakness (generalized): Secondary | ICD-10-CM | POA: Diagnosis not present

## 2021-01-25 DIAGNOSIS — I340 Nonrheumatic mitral (valve) insufficiency: Secondary | ICD-10-CM | POA: Diagnosis not present

## 2021-01-25 DIAGNOSIS — I213 ST elevation (STEMI) myocardial infarction of unspecified site: Secondary | ICD-10-CM | POA: Diagnosis not present

## 2021-01-25 DIAGNOSIS — R2990 Unspecified symptoms and signs involving the nervous system: Secondary | ICD-10-CM | POA: Diagnosis not present

## 2021-01-25 DIAGNOSIS — N179 Acute kidney failure, unspecified: Secondary | ICD-10-CM | POA: Diagnosis not present

## 2021-01-25 DIAGNOSIS — I5020 Unspecified systolic (congestive) heart failure: Secondary | ICD-10-CM | POA: Diagnosis not present

## 2021-01-25 DIAGNOSIS — R2689 Other abnormalities of gait and mobility: Secondary | ICD-10-CM | POA: Diagnosis not present

## 2021-01-26 DIAGNOSIS — T8189XA Other complications of procedures, not elsewhere classified, initial encounter: Secondary | ICD-10-CM | POA: Diagnosis not present

## 2021-01-27 DIAGNOSIS — T8189XA Other complications of procedures, not elsewhere classified, initial encounter: Secondary | ICD-10-CM | POA: Diagnosis not present

## 2021-01-28 DIAGNOSIS — T8189XA Other complications of procedures, not elsewhere classified, initial encounter: Secondary | ICD-10-CM | POA: Diagnosis not present

## 2021-01-28 DIAGNOSIS — Z720 Tobacco use: Secondary | ICD-10-CM | POA: Diagnosis not present

## 2021-01-28 DIAGNOSIS — I10 Essential (primary) hypertension: Secondary | ICD-10-CM | POA: Diagnosis not present

## 2021-01-28 DIAGNOSIS — I6309 Cerebral infarction due to thrombosis of other precerebral artery: Secondary | ICD-10-CM | POA: Diagnosis not present

## 2021-01-28 DIAGNOSIS — Z89612 Acquired absence of left leg above knee: Secondary | ICD-10-CM | POA: Diagnosis not present

## 2021-01-28 DIAGNOSIS — N179 Acute kidney failure, unspecified: Secondary | ICD-10-CM | POA: Diagnosis not present

## 2021-01-28 DIAGNOSIS — I5020 Unspecified systolic (congestive) heart failure: Secondary | ICD-10-CM | POA: Diagnosis not present

## 2021-01-28 DIAGNOSIS — E785 Hyperlipidemia, unspecified: Secondary | ICD-10-CM | POA: Diagnosis not present

## 2021-01-28 DIAGNOSIS — I340 Nonrheumatic mitral (valve) insufficiency: Secondary | ICD-10-CM | POA: Diagnosis not present

## 2021-01-28 DIAGNOSIS — I213 ST elevation (STEMI) myocardial infarction of unspecified site: Secondary | ICD-10-CM | POA: Diagnosis not present

## 2021-01-28 DIAGNOSIS — M6281 Muscle weakness (generalized): Secondary | ICD-10-CM | POA: Diagnosis not present

## 2021-01-28 DIAGNOSIS — R2990 Unspecified symptoms and signs involving the nervous system: Secondary | ICD-10-CM | POA: Diagnosis not present

## 2021-01-28 DIAGNOSIS — I739 Peripheral vascular disease, unspecified: Secondary | ICD-10-CM | POA: Diagnosis not present

## 2021-01-28 DIAGNOSIS — R2689 Other abnormalities of gait and mobility: Secondary | ICD-10-CM | POA: Diagnosis not present

## 2021-01-29 DIAGNOSIS — T8189XA Other complications of procedures, not elsewhere classified, initial encounter: Secondary | ICD-10-CM | POA: Diagnosis not present

## 2021-01-29 DIAGNOSIS — Z0000 Encounter for general adult medical examination without abnormal findings: Secondary | ICD-10-CM | POA: Diagnosis not present

## 2021-01-30 ENCOUNTER — Ambulatory Visit
Admission: RE | Admit: 2021-01-30 | Discharge: 2021-01-30 | Disposition: A | Discharge status: Home / Self Care | Payer: BC Managed Care – PPO | Source: Ambulatory Visit | Attending: Cardiology | Admitting: Cardiology

## 2021-01-30 ENCOUNTER — Encounter: Payer: Self-pay | Admitting: *Deleted

## 2021-01-30 ENCOUNTER — Other Ambulatory Visit: Payer: Self-pay

## 2021-01-30 DIAGNOSIS — I340 Nonrheumatic mitral (valve) insufficiency: Secondary | ICD-10-CM | POA: Diagnosis not present

## 2021-01-30 DIAGNOSIS — R2689 Other abnormalities of gait and mobility: Secondary | ICD-10-CM | POA: Diagnosis not present

## 2021-01-30 DIAGNOSIS — I213 ST elevation (STEMI) myocardial infarction of unspecified site: Secondary | ICD-10-CM | POA: Diagnosis not present

## 2021-01-30 DIAGNOSIS — E785 Hyperlipidemia, unspecified: Secondary | ICD-10-CM | POA: Diagnosis not present

## 2021-01-30 DIAGNOSIS — I5020 Unspecified systolic (congestive) heart failure: Secondary | ICD-10-CM | POA: Diagnosis not present

## 2021-01-30 DIAGNOSIS — I6309 Cerebral infarction due to thrombosis of other precerebral artery: Secondary | ICD-10-CM | POA: Diagnosis not present

## 2021-01-30 DIAGNOSIS — I10 Essential (primary) hypertension: Secondary | ICD-10-CM | POA: Diagnosis not present

## 2021-01-30 DIAGNOSIS — Q2730 Arteriovenous malformation, site unspecified: Secondary | ICD-10-CM

## 2021-01-30 DIAGNOSIS — N179 Acute kidney failure, unspecified: Secondary | ICD-10-CM | POA: Diagnosis not present

## 2021-01-30 DIAGNOSIS — M6281 Muscle weakness (generalized): Secondary | ICD-10-CM | POA: Diagnosis not present

## 2021-01-30 DIAGNOSIS — T8189XA Other complications of procedures, not elsewhere classified, initial encounter: Secondary | ICD-10-CM | POA: Diagnosis not present

## 2021-01-30 DIAGNOSIS — Z89612 Acquired absence of left leg above knee: Secondary | ICD-10-CM | POA: Diagnosis not present

## 2021-01-30 DIAGNOSIS — R2990 Unspecified symptoms and signs involving the nervous system: Secondary | ICD-10-CM | POA: Diagnosis not present

## 2021-01-30 DIAGNOSIS — I739 Peripheral vascular disease, unspecified: Secondary | ICD-10-CM | POA: Diagnosis not present

## 2021-01-30 DIAGNOSIS — Z720 Tobacco use: Secondary | ICD-10-CM | POA: Diagnosis not present

## 2021-01-30 HISTORY — PX: IR RADIOLOGIST EVAL & MGMT: IMG5224

## 2021-01-30 NOTE — Consult Note (Signed)
Chief Complaint: Patient was consulted remotely today (TeleHealth) for occlusion of a pulmonary AVM/AV fistula at the request of Patwardhan,Manish J.    Referring Physician(s): Patwardhan,Manish J  History of Present Illness: Mike [name] is a 60 y.o. male with a recent multifocal stroke in December which presented when he had symptoms of slurred speech, facial droop and weakness while undergoing physical therapy in rehab after leg amputation.  MRI demonstrated multifocal infarcts with acute infarcts in the right MCA and PCA territories and late subacute infarcts in the left frontal lobe and right frontal white matter.  Echocardiography demonstrated reduced left ventricular ejection fraction of 30-35%.  TEE demonstrated atrial shunting with a small PFO and a small secundum ASD.  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  Mike [name] was given IV tPA at the time of his acute stroke and made an excellent recovery.  He currently states that he has no neurologic deficits or persistent symptoms.  He has a history of coronary artery disease, prior PCI, prior MI and severe peripheral vascular disease.  He is status post prior right iliac arterial revascularization by Dr. Virgina Jock on 10/16/20 with stenting of the right common and external iliac arteries.  A left above-knee amputation was performed by Dr. Donnetta Hutching on 11/06/2020 due to progressive gangrene of the left foot.    Mike [name] still has a wound VAC on the left above-knee amputation site.  He is walking with a walker.  He denies significant dyspnea.  He has had no new neurologic symptoms or deficits.   Past Medical History:  Diagnosis Date  . CAD (coronary artery disease)   . Heart attack ([place])   . Hyperlipidemia   . Hypertension   . PAD (peripheral artery  disease) ([place])     Past Surgical History:  Procedure Laterality Date  . ABDOMINAL AORTOGRAM W/LOWER EXTREMITY N/A 10/16/2020   Procedure: ABDOMINAL AORTOGRAM W/LOWER EXTREMITY;  Surgeon: Nigel Mormon, MD;  Location: [facility];  Service: Cardiovascular;  Laterality: N/A;  . AMPUTATION Left 11/06/2020   Procedure: AMPUTATION ABOVE KNEE;  Surgeon: Rosetta Posner, MD;  Location: [facility];  Service: Vascular;  Laterality: Left;  . BUBBLE STUDY  12/14/2020   Procedure: BUBBLE STUDY;  Surgeon: Nigel Mormon, MD;  Location: [facility];  Service: Cardiovascular;;  . CORONARY STENT INTERVENTION N/A 08/03/2020   Procedure: CORONARY STENT INTERVENTION;  Surgeon: Nigel Mormon, MD;  Location: [facility];  Service: Cardiovascular;  Laterality: N/A;  . CORONARY/GRAFT ACUTE MI REVASCULARIZATION N/A 08/01/2020   Procedure: Coronary/Graft Acute MI Revascularization;  Surgeon: Nigel Mormon, MD;  Location: [facility];  Service: Cardiovascular;  Laterality: N/A;  . INTRAVASCULAR LITHOTRIPSY Right 10/16/2020   Procedure: INTRAVASCULAR LITHOTRIPSY;  Surgeon: Nigel Mormon, MD;  Location: [facility];  Service: Cardiovascular;  Laterality: Right;  Common and External Iliac  . LEFT HEART CATH AND CORONARY ANGIOGRAPHY N/A 08/01/2020   Procedure: LEFT HEART CATH AND CORONARY ANGIOGRAPHY;  Surgeon: Nigel Mormon, MD;  Location: [facility];  Service: Cardiovascular;  Laterality: N/A;  . LEFT HEART CATH AND CORONARY ANGIOGRAPHY N/A 08/03/2020   Procedure: LEFT HEART CATH AND CORONARY ANGIOGRAPHY;  Surgeon: Nigel Mormon, MD;  Location: [facility];  Service: Cardiovascular;  Laterality: N/A;  . PERIPHERAL  VASCULAR INTERVENTION Right 10/16/2020   Procedure: PERIPHERAL VASCULAR INTERVENTION;  Surgeon: Nigel Mormon, MD;  Location: [facility];  Service: Cardiovascular;  Laterality: Right;  Common and Iliac Stent  . TEE  WITHOUT CARDIOVERSION N/A 12/14/2020   Procedure: TRANSESOPHAGEAL ECHOCARDIOGRAM (TEE);  Surgeon: Nigel Mormon, MD;  Location: [facility];  Service: Cardiovascular;  Laterality: N/A;    Allergies: Chlorhexidine  Medications: Prior to Admission medications   Medication Sig Start Date End Date Taking? Authorizing Provider  aspirin EC 81 MG tablet Take 1 tablet (81 mg total) by mouth daily. Swallow whole. 01/08/21   Patwardhan, Reynold Bowen, MD  clopidogrel (PLAVIX) 75 MG tablet Take 1 tablet (75 mg total) by mouth daily. 01/08/21   Patwardhan, Reynold Bowen, MD  furosemide (LASIX) 20 MG tablet Take 1 tablet (20 mg total) by mouth daily. 01/08/21   Patwardhan, Reynold Bowen, MD  gabapentin (NEURONTIN) 300 MG capsule Take 1 capsule (300 mg total) by mouth 2 (two) times daily. Patient taking differently: Take 300-600 mg by mouth See admin instructions. Take 1 capsule (300 mg totally) by mouth in the morning; take 2 capsules (600 mg totally) by mouth at bed time 11/15/20   Little Ishikawa, MD  metoprolol succinate (TOPROL-XL) 25 MG 24 hr tablet Take 1 tablet (25 mg total) by mouth daily. Take with or immediately following a meal. 12/20/20 03/20/21  Patwardhan, Reynold Bowen, MD  nitroGLYCERIN (NITROSTAT) 0.4 MG SL tablet Place 1 tablet (0.4 mg total) under the tongue every 5 (five) minutes as needed for chest pain. Patient taking differently: Place 0.4 mg under the tongue every 5 (five) minutes as needed for chest pain (max 3 doses). 08/14/20 11/12/20  Patwardhan, Reynold Bowen, MD  rivaroxaban (XARELTO) 2.5 MG TABS tablet Take 1 tablet (2.5 mg total) by mouth 2 (two) times daily. 12/20/20   Patwardhan, Reynold Bowen, MD  rosuvastatin (CRESTOR) 10 MG tablet Take 1 tablet (10 mg total) by mouth daily. 01/08/21   Patwardhan, Reynold Bowen, MD  sacubitril-valsartan (ENTRESTO) 24-26 MG Take 1 tablet by mouth 2 (two) times daily. 01/24/21   Patwardhan, Reynold Bowen, MD     Family History  Problem Relation Age of Onset  . Heart  disease Mother   . Heart disease Father   . Cancer Brother     Social History   Socioeconomic History  . Marital status: Single    Spouse name: Not on file  . Number of children: 1  . Years of education: Not on file  . Highest education level: Not on file  Occupational History  . Not on file  Tobacco Use  . Smoking status: Current Some Day Smoker    Packs/day: 0.50    Years: 40.00    Pack years: 20.00    Types: Cigarettes  . Smokeless tobacco: Never Used  Vaping Use  . Vaping Use: Never used  Substance and Sexual Activity  . Alcohol use: Yes    Alcohol/week: 10.0 standard drinks    Types: 10 Cans of beer per week    Comment: occ  . Drug use: Never  . Sexual activity: Not on file  Other Topics Concern  . Not on file  Social History Narrative  . Not on file   Social Determinants of Health   Financial Resource Strain: Not on file  Food Insecurity: Not on file  Transportation Needs: Not on file  Physical Activity: Not on file  Stress: Not on file  Social Connections: Not on file  Review of Systems  Constitutional: Negative.   Respiratory: Negative.   Cardiovascular: Negative.   Gastrointestinal: Negative.   Genitourinary: Negative.   Musculoskeletal: Negative.   Neurological: Negative.     Review of Systems: A 12 point ROS discussed and pertinent positives are indicated in the HPI above.  All other systems are negative.  Physical Exam No direct physical exam was performed (except for noted visual exam findings with Video Visits).   Vital Signs: There were no vitals taken for this visit.  Imaging: PCV ECHOCARDIOGRAM COMPLETE  Result Date: 01/22/2021 Echocardiogram 01/21/2021: Left ventricle cavity is normal in size. Mild concentric hypertrophy of the left ventricle. Left ventricle regional wall motion findings: Basal inferolateral and Basal inferior akinesis, mild global hypokinesis.  Moderately depressed LV systolic function with visual EF 35-40%.  Doppler evidence of grade I (impaired) diastolic dysfunction, normal LAP. Left atrial cavity is moderately dilated. Structurally normal mitral valve. No evidence of mitral stenosis. Moderate to severe secondary mitral regurgitation due basal inferior akinesis. Mild tricuspid regurgitation. No evidence of pulmonary hypertension. Compared to previous study on 09/11/2020, LVEF appears improved from 15-20%. Mitral regurgitation is increased in severity from mild to moderate.   LONG TERM MONITOR-LIVE TELEMETRY (3-14 DAYS)  Result Date: 01/21/2021 Mobile cardiac telemetry 14 days 12/20/2020 - 01/03/2021: Dominant rhythm: Sinus. HR 31-167 bpm. Avg HR 76 bpm. 14 episodes of SVT/atrial tachycardia, fastest at 167 bpm, longest for 18.7 sec <1%% isolated SVE, couplet/triplets. 6 episodes of VT/idioventricular rhythm, fastest at 156 bpm, longest for 10 beats 8% isolated VE, 40 sec bigeminy, 55m 17 sec trigeminy. 2 episodes of sinus pauses, 4-4.6 sec noted during sleep hours. No atrial fibrillation/atrial flutter//high grade AV block 0 patient triggered events.    Labs:  CBC: Recent Labs    11/15/20 0338 12/11/20 1020 12/11/20 1027 12/12/20 0130 12/14/20 0450  WBC 9.3 7.7  --  8.2 7.5  HGB 7.0* 12.0* 12.6* 11.0* 11.6*  HCT 22.2* 40.3 37.0* 36.3* 35.5*  PLT 269 221  --  214 202    COAGS: Recent Labs    10/30/20 1822 12/11/20 1020  INR 1.2 1.0  APTT 31 29    BMP: Recent Labs    09/11/20 0806 09/25/20 0903 10/17/20 0825 10/22/20 1054 10/26/20 1004 10/30/20 1652 12/11/20 1020 12/11/20 1027 12/12/20 0130 12/13/20 0129 12/14/20 0450  NA 136 140   < > CANCELED 135   < > 143 144 143 141 140  K 4.7 4.5   < > CANCELED 3.9   < > 3.6 3.6 3.7 3.7 3.9  CL 101 108*   < > CANCELED 98   < > 110 109 110 110 110  CO2 18* 18*   < > CANCELED 11*   < > 24  --  24 21* 22  GLUCOSE 114* 102*   < > 135* 210*   < > 95 101* 105* 96 100*  BUN 19 15   < > 67* 79*   < > 11 11 13 11 13   CALCIUM 9.5 9.4   < >  CANCELED 9.1   < > 8.9  --  8.6* 8.9 8.6*  CREATININE 1.42* 1.16   < > 6.34* 5.36*   < > 1.03 0.90 0.98 1.11 1.09  GFRNONAA 53* 68   < > 9* 11*   < > >60  --  >60 >60 >60  GFRAA 61 78  --  10* 12*  --   --   --   --   --   --    < > =  values in this interval not displayed.    LIVER FUNCTION TESTS: Recent Labs    11/13/20 0409 11/14/20 0236 11/15/20 0338 12/11/20 1020  BILITOT 0.6 0.5 0.4 0.8  AST 50* 47* 37 17  ALT 75* 70* 61* 10  ALKPHOS 55 54 62 76  PROT 5.5* 5.0* 5.0* 6.5  ALBUMIN 1.9* 1.8* 1.8* 3.0*     Assessment and Plan:  I reviewed a CTA of the chest dated 12/14/2020.  This demonstrates a tortuous AV fistula/malformation of the medial left lower lobe with a supplying pulmonary arterial branch measuring up 5-6 mm in caliber, a tortuous nidus/varix region and a prominent draining vein that ascends into the inferior left pulmonary vein.  No other AV malformations are identified.  CTA of the head performed in December did not demonstrate any evidence of intracranial AV malformations or aneurysms.  I discussed potential treatment options for the pulmonary AV fistula with Mike [name] which would consist of a transcatheter occlusion procedure utilizing an occluder device and potential additional embolization coils.  This is the more likely cause of cerebral infarction than the interatrial shunts, but the PFO and ASD may have to be addressed and treated following treatment of the pulmonary AV fistula.  I discussed technical details of the procedure with Mike [name].  Pulmonary embolization is usually performed under general anesthesia as catheterization can sometimes be difficult with movement and breathing and anesthesia allows for a much more controlled setting.  He did tolerate general anesthesia for his amputation in November.  I will ask Dr. Virgina Jock if cardiac clearance is necessary for general anesthesia in the upcoming weeks due to his significant cardiac history.  The  patient is currently on aspirin, Plavix and Xarelto and will be left on antiplatelet medication and anticoagulation even leading up to the procedure as the vascular access is at the level of the femoral vein.  We may hold his Xarelto for just 1 day prior to the procedure.  Currently, given that he may need overnight observation following an occlusion procedure under general anesthesia, we may be restricted in immediate scheduling due to the COVID pandemic and restriction of procedures requiring admission.  I told Mike [name] that I would certainly predict that these restrictions will be lifted in the next few weeks and we could likely perform the procedure in about a month.  In the meantime, we will see if Dr. Virgina Jock will require any testing for cardiac clearance prior to administration of general anesthesia.  Thank you for this interesting consult.  I greatly enjoyed meeting Mike [name] and look forward to participating in their care.  A copy of this report was sent to the requesting provider on this date.  Electronically Signed: Azzie Roup 01/30/2021, 3:03 PM   I spent a total of 30 Minutes in remote  clinical consultation, greater than 50% of which was counseling/coordinating care for a left lung pulmonary AV fistula/malformation.    Visit type: Audio only (telephone). Audio (no video) only due to patient's lack of internet/smartphone capability. Alternative for in-person consultation at [facility], [address] [place], [place], [place]. This visit type was conducted due to national recommendations for restrictions regarding the COVID-19 Pandemic (e.g. social distancing).  This format is felt to be most appropriate for this patient at this time.  All issues noted in this document were discussed and addressed.

## 2021-01-31 DIAGNOSIS — T8189XA Other complications of procedures, not elsewhere classified, initial encounter: Secondary | ICD-10-CM | POA: Diagnosis not present

## 2021-01-31 DIAGNOSIS — I5020 Unspecified systolic (congestive) heart failure: Secondary | ICD-10-CM | POA: Diagnosis not present

## 2021-02-01 DIAGNOSIS — M6281 Muscle weakness (generalized): Secondary | ICD-10-CM | POA: Diagnosis not present

## 2021-02-01 DIAGNOSIS — I6309 Cerebral infarction due to thrombosis of other precerebral artery: Secondary | ICD-10-CM | POA: Diagnosis not present

## 2021-02-01 DIAGNOSIS — I10 Essential (primary) hypertension: Secondary | ICD-10-CM | POA: Diagnosis not present

## 2021-02-01 DIAGNOSIS — Z89612 Acquired absence of left leg above knee: Secondary | ICD-10-CM | POA: Diagnosis not present

## 2021-02-01 DIAGNOSIS — T8189XA Other complications of procedures, not elsewhere classified, initial encounter: Secondary | ICD-10-CM | POA: Diagnosis not present

## 2021-02-01 DIAGNOSIS — R2990 Unspecified symptoms and signs involving the nervous system: Secondary | ICD-10-CM | POA: Diagnosis not present

## 2021-02-01 DIAGNOSIS — R2689 Other abnormalities of gait and mobility: Secondary | ICD-10-CM | POA: Diagnosis not present

## 2021-02-01 DIAGNOSIS — N179 Acute kidney failure, unspecified: Secondary | ICD-10-CM | POA: Diagnosis not present

## 2021-02-01 DIAGNOSIS — E785 Hyperlipidemia, unspecified: Secondary | ICD-10-CM | POA: Diagnosis not present

## 2021-02-01 DIAGNOSIS — Z720 Tobacco use: Secondary | ICD-10-CM | POA: Diagnosis not present

## 2021-02-01 DIAGNOSIS — I213 ST elevation (STEMI) myocardial infarction of unspecified site: Secondary | ICD-10-CM | POA: Diagnosis not present

## 2021-02-01 DIAGNOSIS — I340 Nonrheumatic mitral (valve) insufficiency: Secondary | ICD-10-CM | POA: Diagnosis not present

## 2021-02-01 DIAGNOSIS — I5020 Unspecified systolic (congestive) heart failure: Secondary | ICD-10-CM | POA: Diagnosis not present

## 2021-02-01 DIAGNOSIS — I739 Peripheral vascular disease, unspecified: Secondary | ICD-10-CM | POA: Diagnosis not present

## 2021-02-01 LAB — BASIC METABOLIC PANEL
BUN/Creatinine Ratio: 22 (ref 10–24)
BUN: 22 mg/dL (ref 8–27)
CO2: 20 mmol/L (ref 20–29)
Calcium: 8.9 mg/dL (ref 8.6–10.2)
Chloride: 108 mmol/L — ABNORMAL HIGH (ref 96–106)
Creatinine, Ser: 1 mg/dL (ref 0.76–1.27)
GFR calc Af Amer: 93 mL/min/{1.73_m2} (ref >59)
GFR calc non Af Amer: 81 mL/min/{1.73_m2} (ref >59)
Glucose: 118 mg/dL — ABNORMAL HIGH (ref 65–99)
Potassium: 3.8 mmol/L (ref 3.5–5.2)
Sodium: 145 mmol/L — ABNORMAL HIGH (ref 134–144)

## 2021-02-02 DIAGNOSIS — T8189XA Other complications of procedures, not elsewhere classified, initial encounter: Secondary | ICD-10-CM | POA: Diagnosis not present

## 2021-02-03 DIAGNOSIS — T8189XA Other complications of procedures, not elsewhere classified, initial encounter: Secondary | ICD-10-CM | POA: Diagnosis not present

## 2021-02-04 DIAGNOSIS — T8189XA Other complications of procedures, not elsewhere classified, initial encounter: Secondary | ICD-10-CM | POA: Diagnosis not present

## 2021-02-05 DIAGNOSIS — T8189XA Other complications of procedures, not elsewhere classified, initial encounter: Secondary | ICD-10-CM | POA: Diagnosis not present

## 2021-02-06 ENCOUNTER — Other Ambulatory Visit: Payer: Self-pay

## 2021-02-06 ENCOUNTER — Encounter (HOSPITAL_BASED_OUTPATIENT_CLINIC_OR_DEPARTMENT_OTHER): Payer: BC Managed Care – PPO | Attending: Physician Assistant | Admitting: Physician Assistant

## 2021-02-06 DIAGNOSIS — L89323 Pressure ulcer of left buttock, stage 3: Secondary | ICD-10-CM | POA: Diagnosis not present

## 2021-02-06 DIAGNOSIS — T8131XA Disruption of external operation (surgical) wound, not elsewhere classified, initial encounter: Secondary | ICD-10-CM | POA: Diagnosis not present

## 2021-02-06 DIAGNOSIS — T8189XA Other complications of procedures, not elsewhere classified, initial encounter: Secondary | ICD-10-CM | POA: Diagnosis not present

## 2021-02-06 NOTE — Progress Notes (Addendum)
Mike, [name] ([id number]) Visit Report for 02/06/2021 Chief Complaint Document Details Patient Name: Date of Service: Mike [name]. 02/06/2021 10:00 A M Medical Record Number: [id number] Patient Account Number: [id number] Date of Birth/Sex: Treating RN: Dec 02, 1961 (60 y.o. [name] Primary Care Provider: PA Haig Prophet, [place] Other Clinician: Referring Provider: Treating Provider/Extender: Mardella Layman, Manish Weeks in Treatment: 10 Information Obtained from: Patient Chief Complaint Left gluteal pressure ulcer Electronic Signature(s) Signed: 02/06/2021 10:20:23 AM By: Worthy Keeler PA-C Entered By: Worthy Keeler on 02/06/2021 10:20:22 -------------------------------------------------------------------------------- HPI Details Patient Name: Date of Service: Mike [name] HN S. 02/06/2021 10:00 A M Medical Record Number: [id number] Patient Account Number: [id number] Date of Birth/Sex: Treating RN: 08/28/1961 (60 y.o. [name] Primary Care Provider: PA Haig Prophet, [place] Other Clinician: Referring Provider: Treating Provider/Extender: Mardella Layman, Manish Weeks in Treatment: 10 History of Present Illness HPI Description: 11/28/2020 patient presents for initial evaluation here in our clinic concerning issues that he has been having with wounds actually in the left gluteal region. These occurred following his surgery and he actually had surgery which included a left above-knee amputation. Unfortunately this was necessitated by the fact that the patient had a fairly complicated recent vascular and cardiac history including a STEMI on 08/17/2020. This was complicated by cardiac arrest secondary to chronic ST elevations in the inferior leads, severe bilateral renal artery stenosis, peripheral arterial disease with critical limb ischemia requiring right iliac revascularization of the left lower extremity, chronic congestive heart failure systolic with  ischemic cardiomyopathy with complaints of shortness of breath, decreased ability to walk due to severe leg pain and again the patient had acute critical limb ischemia. He was too high of a risk being that he was on aspirin and Brilinta due to the recent STEMI and therefore was not a candidate for revascularization. Because he was decompensating so quickly it was elected to do an above-knee amputation to treat the progressing gangrene prior to this worsening significantly. This was done up by Dr. Donnetta Hutching on November 06, 2020. He did have a Foley catheter during the time that he was in the hospital. He was there from October 30, 2020 through November 15, 2020. It was during this time that he developed the wounds in the gluteal region on the left. The wounds appear to be in somewhat of a linear pattern that makes me in the suspect that he may have developed these as a result of having sat on a tubing such as a catheter tubing or something of the sort for too long causing a pressure injury although I cannot know that for sure. Not this far out. Currently the patient is on aspirin and Plavix and is currently in skilled nursing at [facility]. 12/19/2020 upon evaluation today patient appears to be doing well with regard to his gluteal wounds are completely healed. That is great news. The main issue is actually with his above-knee amputation stump on the left. He unfortunately is having trouble here with getting this to heal part of the medial aspect is eschar covered and I think this is good have to clear away in order to see this improve. I do believe however that we can initiate Santyl to try to help loosen this up in the future we should be able to debride this away much more effectively and quickly. 01/09/2021 upon evaluation today patient appears to be doing well with regard to his wound currently. He actually appears to be doing  quite well and I do believe the Santyl has been beneficial currently.  There is much looser necrotic tissue noted in the wound bed we are going to need to clean this out I do think a wound VAC may likely be ideal for him in the situation 01/16/2021 upon evaluation today patient appears to be doing well with regard to his wound. In fact the slough seems to be loosening up quite significantly today and the Santyl has really been doing a good job. He has gotten approval for the wound VAC and that should be delivered hopefully tomorrow 01/23/2021 on evaluation today patient actually appears to be doing quite well in regard to his wound. I do believe the wound VAC has done extremely well for him he said that since Friday and already just over the weekend he has noticed a lot of improvement even the home health nurse was surprised by how much improvement. There does not appear to be any evidence of active infection at this time. No fevers, chills, nausea, vomiting, or diarrhea. 02/06/2021 on evaluation today patient's wound actually appears to be doing quite well. This is good news. He did have a lot of pain over the weekend in the past week in general with his wound VAC that was removed and just a wet-to-dry dressing applied. He tells me that without the wound VAC on there is no pain. He tells me that he is really been having pain all along with the wound VAC nonetheless I do feel like this has done a great job for him as far as getting the wound to fill-in we have seen overall about a 75% improvement during the time we have been utilizing this. Electronic Signature(s) Signed: 02/06/2021 11:34:18 AM By: Worthy Keeler PA-C Entered By: Worthy Keeler on 02/06/2021 11:34:17 -------------------------------------------------------------------------------- Physical Exam Details Patient Name: Date of Service: Mike [name] HN S. 02/06/2021 10:00 A M Medical Record Number: [id number] Patient Account Number: [id number] Date of Birth/Sex: Treating RN: 1961/10/10 (60 y.o. [name] Primary Care Provider: PA Haig Prophet, [place] Other Clinician: Referring Provider: Treating Provider/Extender: Mardella Layman, Manish Weeks in Treatment: 10 Constitutional Well-nourished and well-hydrated in no acute distress. Respiratory normal breathing without difficulty. Psychiatric this patient is able to make decisions and demonstrates good insight into disease process. Alert and Oriented x 3. pleasant and cooperative. Notes Upon inspection patient's wound bed actually showed signs of good granulation epithelization at this point. There does not appear to be any evidence of active infection which is great news overall I am extremely pleased with where things stand today. No fevers, chills, nausea, vomiting, or diarrhea. Electronic Signature(s) Signed: 02/06/2021 11:34:37 AM By: Worthy Keeler PA-C Entered By: Worthy Keeler on 02/06/2021 11:34:36 -------------------------------------------------------------------------------- Physician Orders Details Patient Name: Date of Service: Mike [name] HN S. 02/06/2021 10:00 A M Medical Record Number: [id number] Patient Account Number: [id number] Date of Birth/Sex: Treating RN: [birth year] (60 y.o. [name] Primary Care Provider: PA TIENT, [place] Other Clinician: Referring Provider: Treating Provider/Extender: Mardella Layman, Manish Weeks in Treatment: 10 Verbal / Phone Orders: No Diagnosis Coding ICD-10 Coding Code Description [phone] Acquired absence of left leg above knee T81.31XA Disruption of external operation (surgical) wound, not elsewhere classified, initial encounter I25.10 Atherosclerotic heart disease of native coronary artery without angina pectoris I73.89 Other specified peripheral vascular diseases I10 Essential (primary) hypertension Follow-up Appointments Return Appointment in 1 week. Bathing/ Shower/ Hygiene May shower and wash wound with soap and  water. Negative Presssure Wound  Therapy Wound Vac to wound continuously at 12mm/hg pressure - Hold VAC tis week [place] wound care orders this week; continue Home Health for wound care. May utilize formulary equivalent dressing for wound treatment orders unless otherwise specified. - Hold VAC, use hydrofera blue classic Other Home Health Orders/Instructions: - Interim Wound Treatment Wound #3 - Amputation Site - Above Knee Wound Laterality: Left, Medial Cleanser: Wound Cleanser 3 x Per Week/15 Days Discharge Instructions: Cleanse the wound with wound cleanser prior to applying a clean dressing using gauze sponges, not tissue or cotton balls. Prim Dressing: Hydrofera Blue Classic Foam, 2x2 in (Home Health) 3 x Per Week/15 Days ary Discharge Instructions: Moisten with saline prior to applying to wound bed, cut to fit inside wound edges to fill space Prim Dressing: Saline moistened gauze 3 x Per Week/15 Days ary Discharge Instructions: Apply saline moistened gauze in clinic Secondary Dressing: ComfortFoam Border, 6x6 in (silicone border) ([place]) 3 x Per Week/15 Days Discharge Instructions: Apply over primary dressing in clinic Electronic Signature(s) Signed: 02/06/2021 5:10:26 PM By: Baruch Gouty RN, [name] Signed: 02/06/2021 6:51:30 PM By: Worthy Keeler PA-C Entered By: Baruch Gouty on 02/06/2021 11:00:16 -------------------------------------------------------------------------------- Problem List Details Patient Name: Date of Service: Mike [name] HN S. 02/06/2021 10:00 A M Medical Record Number: [id number] Patient Account Number: [id number] Date of Birth/Sex: Treating RN: [birth year] (60 y.o. [name] Primary Care Provider: PA Haig Prophet, [place] Other Clinician: Referring Provider: Treating Provider/Extender: Mardella Layman, Manish Weeks in Treatment: 10 Active Problems ICD-10 Encounter Code Description Active Date MDM Diagnosis [phone] Acquired absence of left leg  above knee 01/16/2021 No Yes T81.31XA Disruption of external operation (surgical) wound, not elsewhere classified, 12/19/2020 No Yes initial encounter I25.10 Atherosclerotic heart disease of native coronary artery without angina pectoris 11/28/2020 No Yes I73.89 Other specified peripheral vascular diseases 11/28/2020 No Yes I10 Essential (primary) hypertension 11/28/2020 No Yes Inactive Problems Resolved Problems ICD-10 Code Description Active Date Resolved Date L89.323 Pressure ulcer of left buttock, stage 3 11/28/2020 11/28/2020 Electronic Signature(s) Signed: 02/06/2021 10:20:17 AM By: Worthy Keeler PA-C Entered By: Worthy Keeler on 02/06/2021 10:20:17 -------------------------------------------------------------------------------- Progress Note Details Patient Name: Date of Service: Mike [name] HN S. 02/06/2021 10:00 A M Medical Record Number: [id number] Patient Account Number: [id number] Date of Birth/Sex: Treating RN: [birth year] (60 y.o. [name] Primary Care Provider: PA Haig Prophet, [place] Other Clinician: Referring Provider: Treating Provider/Extender: Mardella Layman, Manish Weeks in Treatment: 10 Subjective Chief Complaint Information obtained from Patient Left gluteal pressure ulcer History of Present Illness (HPI) 11/28/2020 patient presents for initial evaluation here in our clinic concerning issues that he has been having with wounds actually in the left gluteal region. These occurred following his surgery and he actually had surgery which included a left above-knee amputation. Unfortunately this was necessitated by the fact that the patient had a fairly complicated recent vascular and cardiac history including a STEMI on 08/17/2020. This was complicated by cardiac arrest secondary to chronic ST elevations in the inferior leads, severe bilateral renal artery stenosis, peripheral arterial disease with critical limb ischemia requiring right iliac revascularization  of the left lower extremity, chronic congestive heart failure systolic with ischemic cardiomyopathy with complaints of shortness of breath, decreased ability to walk due to severe leg pain and again the patient had acute critical limb ischemia. He was too high of a risk being that he was on aspirin and Brilinta due to the  recent STEMI and therefore was not a candidate for revascularization. Because he was decompensating so quickly it was elected to do an above-knee amputation to treat the progressing gangrene prior to this worsening significantly. This was done up by Dr. Donnetta Hutching on November 06, 2020. He did have a Foley catheter during the time that he was in the hospital. He was there from October 30, 2020 through November 15, 2020. It was during this time that he developed the wounds in the gluteal region on the left. The wounds appear to be in somewhat of a linear pattern that makes me in the suspect that he may have developed these as a result of having sat on a tubing such as a catheter tubing or something of the sort for too long causing a pressure injury although I cannot know that for sure. Not this far out. Currently the patient is on aspirin and Plavix and is currently in skilled nursing at [facility]. 12/19/2020 upon evaluation today patient appears to be doing well with regard to his gluteal wounds are completely healed. That is great news. The main issue is actually with his above-knee amputation stump on the left. He unfortunately is having trouble here with getting this to heal part of the medial aspect is eschar covered and I think this is good have to clear away in order to see this improve. I do believe however that we can initiate Santyl to try to help loosen this up in the future we should be able to debride this away much more effectively and quickly. 01/09/2021 upon evaluation today patient appears to be doing well with regard to his wound currently. He actually appears to be doing  quite well and I do believe the Annitta Needs has been beneficial currently. There is much looser necrotic tissue noted in the wound bed we are going to need to clean this out I do think a wound VAC may likely be ideal for him in the situation 01/16/2021 upon evaluation today patient appears to be doing well with regard to his wound. In fact the slough seems to be loosening up quite significantly today and the Santyl has really been doing a good job. He has gotten approval for the wound VAC and that should be delivered hopefully tomorrow 01/23/2021 on evaluation today patient actually appears to be doing quite well in regard to his wound. I do believe the wound VAC has done extremely well for him he said that since Friday and already just over the weekend he has noticed a lot of improvement even the home health nurse was surprised by how much improvement. There does not appear to be any evidence of active infection at this time. No fevers, chills, nausea, vomiting, or diarrhea. 02/06/2021 on evaluation today patient's wound actually appears to be doing quite well. This is good news. He did have a lot of pain over the weekend in the past week in general with his wound VAC that was removed and just a wet-to-dry dressing applied. He tells me that without the wound VAC on there is no pain. He tells me that he is really been having pain all along with the wound VAC nonetheless I do feel like this has done a great job for him as far as getting the wound to fill-in we have seen overall about a 75% improvement during the time we have been utilizing this. Objective Constitutional Well-nourished and well-hydrated in no acute distress. Vitals Time Taken: 10:26 AM, Height: 72 in, Weight: 135  lbs, BMI: 18.3, Temperature: 98.7 F, Pulse: 80 bpm, Respiratory Rate: 16 breaths/min, Blood Pressure: 117/74 mmHg. Respiratory normal breathing without difficulty. Psychiatric this patient is able to make decisions and  demonstrates good insight into disease process. Alert and Oriented x 3. pleasant and cooperative. General Notes: Upon inspection patient's wound bed actually showed signs of good granulation epithelization at this point. There does not appear to be any evidence of active infection which is great news overall I am extremely pleased with where things stand today. No fevers, chills, nausea, vomiting, or diarrhea. Integumentary (Hair, Skin) Wound #3 status is Open. Original cause of wound was Surgical Injury. The wound is located on the Left,Medial Amputation Site - Above Knee. The wound measures 1cm length x 3.5cm width x 0.7cm depth; 2.749cm^2 area and 1.924cm^3 volume. There is Fat Layer (Subcutaneous Tissue) exposed. There is no tunneling or undermining noted. There is a large amount of serosanguineous drainage noted. The wound margin is well defined and not attached to the wound base. There is large (67-100%) pink granulation within the wound bed. There is a small (1-33%) amount of necrotic tissue within the wound bed including Adherent Slough. Assessment Active Problems ICD-10 Acquired absence of left leg above knee Disruption of external operation (surgical) wound, not elsewhere classified, initial encounter Atherosclerotic heart disease of native coronary artery without angina pectoris Other specified peripheral vascular diseases Essential (primary) hypertension Plan Follow-up Appointments: Return Appointment in 1 week. Bathing/ Shower/ Hygiene: May shower and wash wound with soap and water. Negative Presssure Wound Therapy: Wound Vac to wound continuously at 151mm/hg pressure - Hold VAC tis week Black Foam Home Health: New wound care orders this week; continue Home Health for wound care. May utilize formulary equivalent dressing for wound treatment orders unless otherwise specified. - Hold VAC, use hydrofera blue classic Other Home Health Orders/Instructions: - Interim WOUND #3: -  Amputation Site - Above Knee Wound Laterality: Left, Medial Cleanser: Wound Cleanser 3 x Per Week/15 Days Discharge Instructions: Cleanse the wound with wound cleanser prior to applying a clean dressing using gauze sponges, not tissue or cotton balls. Prim Dressing: Hydrofera Blue Classic Foam, 2x2 in (Home Health) 3 x Per Week/15 Days ary Discharge Instructions: Moisten with saline prior to applying to wound bed, cut to fit inside wound edges to fill space Prim Dressing: Saline moistened gauze 3 x Per Week/15 Days ary Discharge Instructions: Apply saline moistened gauze in clinic Secondary Dressing: ComfortFoam Border, 6x6 in (silicone border) ([place]) 3 x Per Week/15 Days Discharge Instructions: Apply over primary dressing in clinic 1. Would recommend currently that we put the wound VAC on hold for 1 more week just to see how things do. I am going to use a Hydrofera Blue dressing to try to see if we can help with continuing the granulation and healing that we have been seeing. 2. I am also can recommend that the patient continue to monitor for any infection of the zone of sleep he has any increased pain, drainage, or other significant issues he should let me know soon as possible. 3. I am also going to suggest we cover this [facility] with a border foam dressing that should catch any drainage that occurs. We will see patient back for reevaluation in 1 week here in the clinic. If anything worsens or changes patient will contact our office for additional recommendations. We will make a determination next week on whether or not to send the wound VAC back and discontinue altogether or if  he wants to start this back up. Electronic Signature(s) Signed: 02/06/2021 11:35:46 AM By: Worthy Keeler PA-C Entered By: Worthy Keeler on 02/06/2021 11:35:45 -------------------------------------------------------------------------------- SuperBill Details Patient Name: Date of Service: Mike [place] S. 02/06/2021 Medical Record Number: [id number] Patient Account Number: [id number] Date of Birth/Sex: Treating RN: 10-24-1961 (60 y.o. [name] Primary Care Provider: PA Haig Prophet, [place] Other Clinician: Referring Provider: Treating Provider/Extender: Mardella Layman, Manish Weeks in Treatment: 10 Diagnosis Coding ICD-10 Codes Code Description [phone] Acquired absence of left leg above knee T81.31XA Disruption of external operation (surgical) wound, not elsewhere classified, initial encounter I25.10 Atherosclerotic heart disease of native coronary artery without angina pectoris I73.89 Other specified peripheral vascular diseases I10 Essential (primary) hypertension Facility Procedures CPT4 Code: [id number] Description: 99213 - WOUND CARE VISIT-LEV 3 EST PT Modifier: Quantity: 1 Physician Procedures : CPT4 Code Description Modifier [id number] - WC PHYS LEVEL 3 - EST PT ICD-10 Diagnosis Description Z89.612 Acquired absence of left leg above knee T81.31XA Disruption of external operation (surgical) wound, not elsewhere classified, initial encounter  I25.10 Atherosclerotic heart disease of native coronary artery without angina pectoris I73.89 Other specified peripheral vascular diseases Quantity: 1 Electronic Signature(s) Signed: 02/06/2021 11:35:58 AM By: Worthy Keeler PA-C Entered By: Worthy Keeler on 02/06/2021 11:35:58

## 2021-02-07 ENCOUNTER — Other Ambulatory Visit: Payer: Self-pay | Admitting: Cardiology

## 2021-02-07 DIAGNOSIS — T8189XA Other complications of procedures, not elsewhere classified, initial encounter: Secondary | ICD-10-CM | POA: Diagnosis not present

## 2021-02-07 NOTE — Progress Notes (Signed)
Mike [name] ([id number]) Visit Report for 02/06/2021 Arrival Information Details Patient Name: Date of Service: Mike [name]. 02/06/2021 10:00 A M Medical Record Number: [id number] Patient Account Number: [id number] Date of Birth/Sex: Treating RN: 01-12-1961 (60 y.o. [name] Primary Care Alonnah Lampkins: PA TIENT, NO Other Clinician: Referring Kalsey Lull: Treating Sary Bogie/Extender: Mardella Layman, Manish Weeks in Treatment: 10 Visit Information History Since Last Visit Added or deleted any medications: No Patient Arrived: Walker Any new allergies or adverse reactions: No Arrival Time: 10:24 Had a fall or experienced change in No Accompanied By: wife activities of daily living that may affect Transfer Assistance: None risk of falls: Patient Identification Verified: Yes Signs or symptoms of abuse/neglect since last visito No Secondary Verification Process Completed: Yes Hospitalized since last visit: No Patient Requires Transmission-Based Precautions: No Implantable device outside of the clinic excluding No Patient Has Alerts: Yes cellular tissue based products placed in the center Patient Alerts: Patient on Blood Thinner since last visit: Has Dressing in Place as Prescribed: Yes Pain Present Now: No Electronic Signature(s) Signed: 02/07/2021 8:34:45 AM By: Sandre Kitty Entered By: Sandre Kitty on 02/06/2021 10:26:01 -------------------------------------------------------------------------------- Clinic Level of Care Assessment Details Patient Name: Date of Service: Mike [name]. 02/06/2021 10:00 A M Medical Record Number: [id number] Patient Account Number: [id number] Date of Birth/Sex: Treating RN: [birth year] (60 y.o. [name] Primary Care Landen Knoedler: PA Haig Prophet, NO Other Clinician: Referring Ngina Royer: Treating Rachid Parham/Extender: Mardella Layman, Manish Weeks in Treatment: 10 Clinic Level of Care Assessment Items TOOL  4 Quantity Score '[]'  - 0 Use when only an EandM is performed on FOLLOW-UP visit ASSESSMENTS - Nursing Assessment / Reassessment X- 1 10 Reassessment of Co-morbidities (includes updates in patient status) X- 1 5 Reassessment of Adherence to Treatment Plan ASSESSMENTS - Wound and Skin A ssessment / Reassessment X - Simple Wound Assessment / Reassessment - one wound 1 5 '[]'  - 0 Complex Wound Assessment / Reassessment - multiple wounds '[]'  - 0 Dermatologic / Skin Assessment (not related to wound area) ASSESSMENTS - Focused Assessment '[]'  - 0 Circumferential Edema Measurements - multi extremities '[]'  - 0 Nutritional Assessment / Counseling / Intervention '[]'  - 0 Lower Extremity Assessment (monofilament, tuning fork, pulses) '[]'  - 0 Peripheral Arterial Disease Assessment (using hand held doppler) ASSESSMENTS - Ostomy and/or Continence Assessment and Care '[]'  - 0 Incontinence Assessment and Management '[]'  - 0 Ostomy Care Assessment and Management (repouching, etc.) PROCESS - Coordination of Care X - Simple Patient / Family Education for ongoing care 1 15 '[]'  - 0 Complex (extensive) Patient / Family Education for ongoing care X- 1 10 Staff obtains Programmer, systems, Records, T Results / Process Orders est X- 1 10 Staff telephones HHA, Nursing Homes / Clarify orders / etc '[]'  - 0 Routine Transfer to another Facility (non-emergent condition) '[]'  - 0 Routine Hospital Admission (non-emergent condition) '[]'  - 0 New Admissions / Biomedical engineer / Ordering NPWT Apligraf, etc. , '[]'  - 0 Emergency Hospital Admission (emergent condition) X- 1 10 Simple Discharge Coordination '[]'  - 0 Complex (extensive) Discharge Coordination PROCESS - Special Needs '[]'  - 0 Pediatric / Minor Patient Management '[]'  - 0 Isolation Patient Management '[]'  - 0 Hearing / Language / Visual special needs '[]'  - 0 Assessment of Community assistance (transportation, D/C planning, etc.) '[]'  - 0 Additional assistance /  Altered mentation '[]'  - 0 Support Surface(s) Assessment (bed, cushion, seat, etc.) INTERVENTIONS - Wound Cleansing / Measurement X - Simple Wound Cleansing - one  wound 1 5 '[]'  - 0 Complex Wound Cleansing - multiple wounds X- 1 5 Wound Imaging (photographs - any number of wounds) '[]'  - 0 Wound Tracing (instead of photographs) X- 1 5 Simple Wound Measurement - one wound '[]'  - 0 Complex Wound Measurement - multiple wounds INTERVENTIONS - Wound Dressings X - Small Wound Dressing one or multiple wounds 1 10 '[]'  - 0 Medium Wound Dressing one or multiple wounds '[]'  - 0 Large Wound Dressing one or multiple wounds X- 1 5 Application of Medications - topical '[]'  - 0 Application of Medications - injection INTERVENTIONS - Miscellaneous '[]'  - 0 External ear exam '[]'  - 0 Specimen Collection (cultures, biopsies, blood, body fluids, etc.) '[]'  - 0 Specimen(s) / Culture(s) sent or taken to Lab for analysis '[]'  - 0 Patient Transfer (multiple staff / Civil Service fast streamer / Similar devices) '[]'  - 0 Simple Staple / Suture removal (25 or less) '[]'  - 0 Complex Staple / Suture removal (26 or more) '[]'  - 0 Hypo / Hyperglycemic Management (close monitor of Blood Glucose) '[]'  - 0 Ankle / Brachial Index (ABI) - do not check if billed separately X- 1 5 Vital Signs Has the patient been seen at the hospital within the last three years: Yes Total Score: 100 Level Of Care: New/Established - Level 3 Electronic Signature(s) Signed: 02/06/2021 5:10:26 PM By: Baruch Gouty RN, [name] Entered By: Baruch Gouty on 02/06/2021 10:54:40 -------------------------------------------------------------------------------- Encounter Discharge Information Details Patient Name: Date of Service: Mike [name] HN S. 02/06/2021 10:00 A M Medical Record Number: [id number] Patient Account Number: [id number] Date of Birth/Sex: Treating RN: September 25, 1961 (60 y.o. Erie Noe Primary Care Mikia Delaluz: PA Haig Prophet, NO Other Clinician: Referring  Elmer Merwin: Treating Mike [name]/Extender: Mardella Layman, Manish Weeks in Treatment: 10 Encounter Discharge Information Items Discharge Condition: Stable Ambulatory Status: Walker Discharge Destination: Home Transportation: Private Auto Accompanied By: self Schedule Follow-up Appointment: Yes Clinical Summary of Care: Patient Declined Electronic Signature(s) Signed: 02/06/2021 5:02:07 PM By: Rhae Hammock RN Entered By: Rhae Hammock on 02/06/2021 11:08:31 -------------------------------------------------------------------------------- [place] Details Patient Name: Date of Service: Mike [name] HN S. 02/06/2021 10:00 A M Medical Record Number: [id number] Patient Account Number: [id number] Date of Birth/Sex: Treating RN: 16-Apr-1961 (60 y.o. [name] Primary Care Ondra Deboard: PA Haig Prophet, NO Other Clinician: Referring Donald Jacque: Treating Lyndol Vanderheiden/Extender: Mardella Layman, Manish Weeks in Treatment: 10 Active Inactive Wound/Skin Impairment Nursing Diagnoses: Impaired tissue integrity Knowledge deficit related to ulceration/compromised skin integrity Goals: Patient/caregiver will verbalize understanding of skin care regimen Date Initiated: 11/28/2020 Target Resolution Date: 02/13/2021 Goal Status: Active Ulcer/skin breakdown will have a volume reduction of 30% by week 4 Date Initiated: 11/28/2020 Date Inactivated: 12/19/2020 Target Resolution Date: 12/26/2020 Goal Status: Met Interventions: Assess patient/caregiver ability to obtain necessary supplies Assess patient/caregiver ability to perform ulcer/skin care regimen upon admission and as needed Assess ulceration(s) every visit Provide education on ulcer and skin care Treatment Activities: Skin care regimen initiated : 11/28/2020 Topical wound management initiated : 11/28/2020 Notes: Electronic Signature(s) Signed: 02/06/2021 5:10:26 PM By: Baruch Gouty RN, [name] Entered By:  Baruch Gouty on 02/06/2021 10:47:23 -------------------------------------------------------------------------------- Pain Assessment Details Patient Name: Date of Service: Mike [name] HN S. 02/06/2021 10:00 A M Medical Record Number: [id number] Patient Account Number: [id number] Date of Birth/Sex: Treating RN: 28-Mar-1961 (61 y.o. [name] Primary Care Alia Parsley: PA Haig Prophet, NO Other Clinician: Referring Gershon Shorten: Treating Escarlet Saathoff/Extender: Mardella Layman, Manish Weeks in Treatment: 10 Active Problems Location of Pain Severity and Description of  Pain Patient Has Paino No Site Locations Pain Management and Medication Current Pain Management: Electronic Signature(s) Signed: 02/06/2021 5:10:26 PM By: Baruch Gouty RN, [name] Signed: 02/07/2021 8:34:45 AM By: Sandre Kitty Entered By: Sandre Kitty on 02/06/2021 10:26:23 -------------------------------------------------------------------------------- Patient/Caregiver Education Details Patient Name: Date of Service: Mike [name] 2/9/2022andnbsp10:00 A M Medical Record Number: [id number] Patient Account Number: [id number] Date of Birth/Gender: Treating RN: 05-06-1961 (60 y.o. [name] Primary Care Physician: PA Haig Prophet, NO Other Clinician: Referring Physician: Treating Physician/Extender: Mardella Layman, Manish Weeks in Treatment: 10 Education Assessment Education Provided To: Patient Education Topics Provided Wound/Skin Impairment: Methods: Explain/Verbal Responses: Reinforcements needed, State content correctly [employer]) Signed: 02/06/2021 5:10:26 PM By: Baruch Gouty RN, [name] Entered By: Baruch Gouty on 02/06/2021 10:47:39 -------------------------------------------------------------------------------- Wound Assessment Details Patient Name: Date of Service: Mike [name] HN S. 02/06/2021 10:00 A M Medical Record Number: [id number] Patient Account  Number: [id number] Date of Birth/Sex: Treating RN: [birth year] (60 y.o. [name] Primary Care [name] [name]: PA Haig Prophet, NO Other Clinician: Referring Tephanie Escorcia: Treating Makaylin Carlo/Extender: Mardella Layman, Manish Weeks in Treatment: 10 Wound Status Wound Number: 3 Primary Dehisced Wound Etiology: Wound Location: Left, Medial Amputation Site - Above Knee Wound Open Wounding Event: Surgical Injury Status: Date Acquired: 11/06/2020 Comorbid Sleep Apnea, Coronary Artery Disease, Hypotension, Myocardial Weeks Of Treatment: 7 History: Infarction, Peripheral Arterial Disease Clustered Wound: No Wound Measurements Length: (cm) 1 Width: (cm) 3.5 Depth: (cm) 0.7 Area: (cm) 2.749 Volume: (cm) 1.924 % Reduction in Area: 75.9% % Reduction in Volume: 83.1% Epithelialization: Small (1-33%) Tunneling: No Undermining: No Wound Description Classification: Full Thickness Without Exposed Support Structures Wound Margin: Well defined, not attached Exudate Amount: Large Exudate Type: Serosanguineous Exudate Color: red, brown Foul Odor After Cleansing: No Slough/Fibrino Yes Wound Bed Granulation Amount: Large (67-100%) Exposed Structure Granulation Quality: Pink Fascia Exposed: No Necrotic Amount: Small (1-33%) Fat Layer (Subcutaneous Tissue) Exposed: Yes Necrotic Quality: Adherent Slough Tendon Exposed: No Muscle Exposed: No Joint Exposed: No Bone Exposed: No Treatment Notes Wound #3 (Amputation Site - Above Knee) Wound Laterality: Left, Medial Cleanser Wound Cleanser Discharge Instruction: Cleanse the wound with wound cleanser prior to applying a clean dressing using gauze sponges, not tissue or cotton balls. Peri-Wound Care Topical Primary Dressing Hydrofera Blue Classic Foam, 2x2 in Discharge Instruction: Moisten with saline prior to applying to wound bed, cut to fit inside wound edges to fill space Saline moistened gauze Discharge Instruction: Apply saline  moistened gauze in clinic Secondary Dressing ComfortFoam Border, 6x6 in (silicone border) Discharge Instruction: Apply over primary dressing in clinic Secured With Compression Wrap Compression Stockings Add-Ons Electronic Signature(s) Signed: 02/06/2021 5:02:07 PM By: Rhae Hammock RN Signed: 02/06/2021 5:10:26 PM By: Baruch Gouty RN, [name] Entered By: Rhae Hammock on 02/06/2021 10:35:58 -------------------------------------------------------------------------------- [place] Details Patient Name: Date of Service: Mike [name] HN S. 02/06/2021 10:00 A M Medical Record Number: [id number] Patient Account Number: [id number] Date of Birth/Sex: Treating RN: 12-Dec-1961 (60 y.o. [name] Primary Care Chantay Whitelock: PA TIENT, NO Other Clinician: Referring Shakevia Sarris: Treating Mazie Fencl/Extender: Mardella Layman, Manish Weeks in Treatment: 10 Vital Signs Time Taken: 10:26 Temperature (F): 98.7 Height (in): 72 Pulse (bpm): 80 Weight (lbs): 135 Respiratory Rate (breaths/min): 16 Body Mass Index (BMI): 18.3 Blood Pressure (mmHg): 117/74 Reference Range: 80 - 120 mg / dl Electronic Signature(s) Signed: 02/07/2021 8:34:45 AM By: Sandre Kitty Entered By: Sandre Kitty on 02/06/2021 10:26:16

## 2021-02-08 DIAGNOSIS — I340 Nonrheumatic mitral (valve) insufficiency: Secondary | ICD-10-CM | POA: Diagnosis not present

## 2021-02-08 DIAGNOSIS — T8189XA Other complications of procedures, not elsewhere classified, initial encounter: Secondary | ICD-10-CM | POA: Diagnosis not present

## 2021-02-08 DIAGNOSIS — R2689 Other abnormalities of gait and mobility: Secondary | ICD-10-CM | POA: Diagnosis not present

## 2021-02-08 DIAGNOSIS — I739 Peripheral vascular disease, unspecified: Secondary | ICD-10-CM | POA: Diagnosis not present

## 2021-02-08 DIAGNOSIS — I5020 Unspecified systolic (congestive) heart failure: Secondary | ICD-10-CM | POA: Diagnosis not present

## 2021-02-08 DIAGNOSIS — I213 ST elevation (STEMI) myocardial infarction of unspecified site: Secondary | ICD-10-CM | POA: Diagnosis not present

## 2021-02-08 DIAGNOSIS — I6309 Cerebral infarction due to thrombosis of other precerebral artery: Secondary | ICD-10-CM | POA: Diagnosis not present

## 2021-02-08 DIAGNOSIS — Z720 Tobacco use: Secondary | ICD-10-CM | POA: Diagnosis not present

## 2021-02-08 DIAGNOSIS — M6281 Muscle weakness (generalized): Secondary | ICD-10-CM | POA: Diagnosis not present

## 2021-02-08 DIAGNOSIS — N179 Acute kidney failure, unspecified: Secondary | ICD-10-CM | POA: Diagnosis not present

## 2021-02-08 DIAGNOSIS — R2990 Unspecified symptoms and signs involving the nervous system: Secondary | ICD-10-CM | POA: Diagnosis not present

## 2021-02-08 DIAGNOSIS — Z89612 Acquired absence of left leg above knee: Secondary | ICD-10-CM | POA: Diagnosis not present

## 2021-02-08 DIAGNOSIS — E785 Hyperlipidemia, unspecified: Secondary | ICD-10-CM | POA: Diagnosis not present

## 2021-02-08 DIAGNOSIS — I10 Essential (primary) hypertension: Secondary | ICD-10-CM | POA: Diagnosis not present

## 2021-02-09 DIAGNOSIS — T8189XA Other complications of procedures, not elsewhere classified, initial encounter: Secondary | ICD-10-CM | POA: Diagnosis not present

## 2021-02-10 DIAGNOSIS — T8189XA Other complications of procedures, not elsewhere classified, initial encounter: Secondary | ICD-10-CM | POA: Diagnosis not present

## 2021-02-11 DIAGNOSIS — Z720 Tobacco use: Secondary | ICD-10-CM | POA: Diagnosis not present

## 2021-02-11 DIAGNOSIS — I5020 Unspecified systolic (congestive) heart failure: Secondary | ICD-10-CM | POA: Diagnosis not present

## 2021-02-11 DIAGNOSIS — I213 ST elevation (STEMI) myocardial infarction of unspecified site: Secondary | ICD-10-CM | POA: Diagnosis not present

## 2021-02-11 DIAGNOSIS — M6281 Muscle weakness (generalized): Secondary | ICD-10-CM | POA: Diagnosis not present

## 2021-02-11 DIAGNOSIS — T8189XA Other complications of procedures, not elsewhere classified, initial encounter: Secondary | ICD-10-CM | POA: Diagnosis not present

## 2021-02-11 DIAGNOSIS — I6309 Cerebral infarction due to thrombosis of other precerebral artery: Secondary | ICD-10-CM | POA: Diagnosis not present

## 2021-02-11 DIAGNOSIS — R2689 Other abnormalities of gait and mobility: Secondary | ICD-10-CM | POA: Diagnosis not present

## 2021-02-11 DIAGNOSIS — N179 Acute kidney failure, unspecified: Secondary | ICD-10-CM | POA: Diagnosis not present

## 2021-02-11 DIAGNOSIS — Z89612 Acquired absence of left leg above knee: Secondary | ICD-10-CM | POA: Diagnosis not present

## 2021-02-11 DIAGNOSIS — I10 Essential (primary) hypertension: Secondary | ICD-10-CM | POA: Diagnosis not present

## 2021-02-11 DIAGNOSIS — I739 Peripheral vascular disease, unspecified: Secondary | ICD-10-CM | POA: Diagnosis not present

## 2021-02-11 DIAGNOSIS — E785 Hyperlipidemia, unspecified: Secondary | ICD-10-CM | POA: Diagnosis not present

## 2021-02-11 DIAGNOSIS — R2990 Unspecified symptoms and signs involving the nervous system: Secondary | ICD-10-CM | POA: Diagnosis not present

## 2021-02-11 DIAGNOSIS — I340 Nonrheumatic mitral (valve) insufficiency: Secondary | ICD-10-CM | POA: Diagnosis not present

## 2021-02-12 DIAGNOSIS — T8189XA Other complications of procedures, not elsewhere classified, initial encounter: Secondary | ICD-10-CM | POA: Diagnosis not present

## 2021-02-13 ENCOUNTER — Other Ambulatory Visit: Payer: Self-pay | Admitting: Physician Assistant

## 2021-02-13 ENCOUNTER — Other Ambulatory Visit: Payer: Self-pay

## 2021-02-13 ENCOUNTER — Encounter (HOSPITAL_BASED_OUTPATIENT_CLINIC_OR_DEPARTMENT_OTHER): Payer: BC Managed Care – PPO | Admitting: Physician Assistant

## 2021-02-13 DIAGNOSIS — T8131XA Disruption of external operation (surgical) wound, not elsewhere classified, initial encounter: Secondary | ICD-10-CM | POA: Diagnosis not present

## 2021-02-13 DIAGNOSIS — L89323 Pressure ulcer of left buttock, stage 3: Secondary | ICD-10-CM | POA: Diagnosis not present

## 2021-02-13 DIAGNOSIS — T8189XA Other complications of procedures, not elsewhere classified, initial encounter: Secondary | ICD-10-CM | POA: Diagnosis not present

## 2021-02-13 NOTE — Progress Notes (Addendum)
Mike, [name] ([id number]) Visit Report for 02/13/2021 Chief Complaint Document Details Patient Name: Date of Service: Mike [name] 02/13/2021 3:15 PM Medical Record Number: [id number] Patient Account Number: [id number] Date of Birth/Sex: Treating RN: [birth year] (60 y.o. [name] Primary Care Provider: PA Haig Prophet, [place] Other Clinician: Referring Provider: Treating Provider/Extender: Mardella Layman, Manish Weeks in Treatment: 11 Information Obtained from: Patient Chief Complaint Left gluteal pressure ulcer Electronic Signature(s) Signed: 02/13/2021 3:36:10 PM By: Worthy Keeler PA-C Entered By: Worthy Keeler on 02/13/2021 15:36:10 -------------------------------------------------------------------------------- HPI Details Patient Name: Date of Service: Mike [name] S. 02/13/2021 3:15 PM Medical Record Number: [id number] Patient Account Number: [id number] Date of Birth/Sex: Treating RN: 23-Aug-1961 (60 y.o. [name] Primary Care Provider: PA Haig Prophet, [place] Other Clinician: Referring Provider: Treating Provider/Extender: Mardella Layman, Manish Weeks in Treatment: 11 History of Present Illness HPI Description: 11/28/2020 patient presents for initial evaluation here in our clinic concerning issues that he has been having with wounds actually in the left gluteal region. These occurred following his surgery and he actually had surgery which included a left above-knee amputation. Unfortunately this was necessitated by the fact that the patient had a fairly complicated recent vascular and cardiac history including a STEMI on 08/17/2020. This was complicated by cardiac arrest secondary to chronic ST elevations in the inferior leads, severe bilateral renal artery stenosis, peripheral arterial disease with critical limb ischemia requiring right iliac revascularization of the left lower extremity, chronic congestive heart failure systolic with  ischemic cardiomyopathy with complaints of shortness of breath, decreased ability to walk due to severe leg pain and again the patient had acute critical limb ischemia. He was too high of a risk being that he was on aspirin and Brilinta due to the recent STEMI and therefore was not a candidate for revascularization. Because he was decompensating so quickly it was elected to do an above-knee amputation to treat the progressing gangrene prior to this worsening significantly. This was done up by Dr. Donnetta Hutching on November 06, 2020. He did have a Foley catheter during the time that he was in the hospital. He was there from October 30, 2020 through November 15, 2020. It was during this time that he developed the wounds in the gluteal region on the left. The wounds appear to be in somewhat of a linear pattern that makes me in the suspect that he may have developed these as a result of having sat on a tubing such as a catheter tubing or something of the sort for too long causing a pressure injury although I cannot know that for sure. Not this far out. Currently the patient is on aspirin and Plavix and is currently in skilled nursing at [facility]. 12/19/2020 upon evaluation today patient appears to be doing well with regard to his gluteal wounds are completely healed. That is great news. The main issue is actually with his above-knee amputation stump on the left. He unfortunately is having trouble here with getting this to heal part of the medial aspect is eschar covered and I think this is good have to clear away in order to see this improve. I do believe however that we can initiate Santyl to try to help loosen this up in the future we should be able to debride this away much more effectively and quickly. 01/09/2021 upon evaluation today patient appears to be doing well with regard to his wound currently. He actually appears to be doing quite well  and I do believe the Santyl has been beneficial currently.  There is much looser necrotic tissue noted in the wound bed we are going to need to clean this out I do think a wound VAC may likely be ideal for him in the situation 01/16/2021 upon evaluation today patient appears to be doing well with regard to his wound. In fact the slough seems to be loosening up quite significantly today and the Santyl has really been doing a good job. He has gotten approval for the wound VAC and that should be delivered hopefully tomorrow 01/23/2021 on evaluation today patient actually appears to be doing quite well in regard to his wound. I do believe the wound VAC has done extremely well for him he said that since Friday and already just over the weekend he has noticed a lot of improvement even the home health nurse was surprised by how much improvement. There does not appear to be any evidence of active infection at this time. No fevers, chills, nausea, vomiting, or diarrhea. 02/06/2021 on evaluation today patient's wound actually appears to be doing quite well. This is good news. He did have a lot of pain over the weekend in the past week in general with his wound VAC that was removed and just a wet-to-dry dressing applied. He tells me that without the wound VAC on there is no pain. He tells me that he is really been having pain all along with the wound VAC nonetheless I do feel like this has done a great job for him as far as getting the wound to fill-in we have seen overall about a 75% improvement during the time we have been utilizing this. 02/13/2021 on evaluation today patient's wound actually showed signs of fairly good improvement which is great news. There does not appear to be any signs of active infection at this time also good news. With that being said he has been using the [facility] and that is doing a great job. He is really felt like he would like to just discontinue wound VAC which I completely understand. Electronic Signature(s) Signed: 02/13/2021 5:05:50 PM  By: Worthy Keeler PA-C Entered By: Worthy Keeler on 02/13/2021 17:05:49 -------------------------------------------------------------------------------- Physical Exam Details Patient Name: Date of Service: [name] 02/13/2021 3:15 PM Medical Record Number: [id number] Patient Account Number: [id number] Date of Birth/Sex: Treating RN: [birth year] (60 y.o. [name] Primary Care Provider: PA Haig Prophet, [place] Other Clinician: Referring Provider: Treating Provider/Extender: Mardella Layman, Manish Weeks in Treatment: 69 Constitutional Well-nourished and well-hydrated in no acute distress. Respiratory normal breathing without difficulty. Psychiatric this patient is able to make decisions and demonstrates good insight into disease process. Alert and Oriented x 3. pleasant and cooperative. Notes Upon inspection patient's wound bed actually showed signs of good granulation at this time. There does not appear to be any signs of active infection which is great news and overall very pleased with where things stand today. I do think that the depth of the wound has coming tremendously and overall I think he is doing quite well. Electronic Signature(s) Signed: 02/13/2021 5:06:07 PM By: Worthy Keeler PA-C Entered By: Worthy Keeler on 02/13/2021 17:06:06 -------------------------------------------------------------------------------- Physician Orders Details Patient Name: Date of Service: Mike [name] S. 02/13/2021 3:15 PM Medical Record Number: [id number] Patient Account Number: [id number] Date of Birth/Sex: Treating RN: 1961-04-17 (60 y.o. [name] Primary Care Provider: PA Haig Prophet, [place] Other Clinician: Referring Provider: Treating Provider/Extender: Melburn Hake,  Okey Dupre, Manish Weeks in Treatment: 11 Verbal / Phone Orders: No Diagnosis Coding ICD-10 Coding Code Description [phone] Acquired absence of left leg above knee T81.31XA Disruption of  external operation (surgical) wound, not elsewhere classified, initial encounter I25.10 Atherosclerotic heart disease of native coronary artery without angina pectoris I73.89 Other specified peripheral vascular diseases I10 Essential (primary) hypertension Follow-up Appointments Return Appointment in 2 weeks. Bathing/ Shower/ Hygiene May shower and wash wound with soap and water. Negative Presssure Wound Therapy Wound Vac to wound continuously at 148mm/hg pressure - Discontinue VAC Home Health No change in wound care orders this week; continue Home Health for wound care. May utilize formulary equivalent dressing for wound treatment orders unless otherwise specified. Other Home Health Orders/Instructions: - Interim Wound Treatment Wound #3 - Amputation Site - Above Knee Wound Laterality: Left, Medial Cleanser: Wound Cleanser 3 x Per Week/30 Days Discharge Instructions: Cleanse the wound with wound cleanser prior to applying a clean dressing using gauze sponges, not tissue or cotton balls. Prim Dressing: Hydrofera Blue Classic Foam, 2x2 in (Home Health) 3 x Per Week/30 Days ary Discharge Instructions: Moisten with saline prior to applying to wound bed, cut to fit inside wound edges to fill space Prim Dressing: Saline moistened gauze 3 x Per Week/30 Days ary Discharge Instructions: Apply saline moistened gauze in clinic Secondary Dressing: ComfortFoam Border, 6x6 in (silicone border) ([place]) 3 x Per Week/30 Days Discharge Instructions: Apply over primary dressing in clinic Electronic Signature(s) Signed: 02/13/2021 5:28:15 PM By: Worthy Keeler PA-C Signed: 02/13/2021 6:01:36 PM By: Baruch Gouty RN, [name] Entered By: Baruch Gouty on 02/13/2021 17:04:20 -------------------------------------------------------------------------------- Problem List Details Patient Name: Date of Service: [name] 02/13/2021 3:15 PM Medical Record Number: [id number] Patient Account  Number: [id number] Date of Birth/Sex: Treating RN: Mar 03, 1961 (60 y.o. [name] Primary Care Provider: PA Haig Prophet, [place] Other Clinician: Referring Provider: Treating Provider/Extender: Mardella Layman, Manish Weeks in Treatment: 11 Active Problems ICD-10 Encounter Code Description Active Date MDM Diagnosis [phone] Acquired absence of left leg above knee 01/16/2021 No Yes T81.31XA Disruption of external operation (surgical) wound, not elsewhere classified, 12/19/2020 No Yes initial encounter I25.10 Atherosclerotic heart disease of native coronary artery without angina pectoris 11/28/2020 No Yes I73.89 Other specified peripheral vascular diseases 11/28/2020 No Yes I10 Essential (primary) hypertension 11/28/2020 No Yes Inactive Problems Resolved Problems ICD-10 Code Description Active Date Resolved Date L89.323 Pressure ulcer of left buttock, stage 3 11/28/2020 11/28/2020 Electronic Signature(s) Signed: 02/13/2021 3:36:05 PM By: Worthy Keeler PA-C Entered By: Worthy Keeler on 02/13/2021 15:36:05 -------------------------------------------------------------------------------- Progress Note Details Patient Name: Date of Service: [name] 02/13/2021 3:15 PM Medical Record Number: [id number] Patient Account Number: [id number] Date of Birth/Sex: Treating RN: Oct 26, 1961 (60 y.o. [name] Primary Care Provider: PA Haig Prophet, [place] Other Clinician: Referring Provider: Treating Provider/Extender: Mardella Layman, Manish Weeks in Treatment: 11 Subjective Chief Complaint Information obtained from Patient Left gluteal pressure ulcer History of Present Illness (HPI) 11/28/2020 patient presents for initial evaluation here in our clinic concerning issues that he has been having with wounds actually in the left gluteal region. These occurred following his surgery and he actually had surgery which included a left above-knee amputation. Unfortunately this  was necessitated by the fact that the patient had a fairly complicated recent vascular and cardiac history including a STEMI on 08/17/2020. This was complicated by cardiac arrest secondary to chronic ST elevations in the inferior leads, severe bilateral renal artery stenosis,  peripheral arterial disease with critical limb ischemia requiring right iliac revascularization of the left lower extremity, chronic congestive heart failure systolic with ischemic cardiomyopathy with complaints of shortness of breath, decreased ability to walk due to severe leg pain and again the patient had acute critical limb ischemia. He was too high of a risk being that he was on aspirin and Brilinta due to the recent STEMI and therefore was not a candidate for revascularization. Because he was decompensating so quickly it was elected to do an above-knee amputation to treat the progressing gangrene prior to this worsening significantly. This was done up by Dr. Donnetta Hutching on November 06, 2020. He did have a Foley catheter during the time that he was in the hospital. He was there from October 30, 2020 through November 15, 2020. It was during this time that he developed the wounds in the gluteal region on the left. The wounds appear to be in somewhat of a linear pattern that makes me in the suspect that he may have developed these as a result of having sat on a tubing such as a catheter tubing or something of the sort for too long causing a pressure injury although I cannot know that for sure. Not this far out. Currently the patient is on aspirin and Plavix and is currently in skilled nursing at [facility]. 12/19/2020 upon evaluation today patient appears to be doing well with regard to his gluteal wounds are completely healed. That is great news. The main issue is actually with his above-knee amputation stump on the left. He unfortunately is having trouble here with getting this to heal part of the medial aspect is eschar covered  and I think this is good have to clear away in order to see this improve. I do believe however that we can initiate Santyl to try to help loosen this up in the future we should be able to debride this away much more effectively and quickly. 01/09/2021 upon evaluation today patient appears to be doing well with regard to his wound currently. He actually appears to be doing quite well and I do believe the [facility] has been beneficial currently. There is much looser necrotic tissue noted in the wound bed we are going to need to clean this out I do think a wound VAC may likely be ideal for him in the situation 01/16/2021 upon evaluation today patient appears to be doing well with regard to his wound. In fact the slough seems to be loosening up quite significantly today and the Santyl has really been doing a good job. He has gotten approval for the wound VAC and that should be delivered hopefully tomorrow 01/23/2021 on evaluation today patient actually appears to be doing quite well in regard to his wound. I do believe the wound VAC has done extremely well for him he said that since Friday and already just over the weekend he has noticed a lot of improvement even the home health nurse was surprised by how much improvement. There does not appear to be any evidence of active infection at this time. No fevers, chills, nausea, vomiting, or diarrhea. 02/06/2021 on evaluation today patient's wound actually appears to be doing quite well. This is good news. He did have a lot of pain over the weekend in the past week in general with his wound VAC that was removed and just a wet-to-dry dressing applied. He tells me that without the wound VAC on there is no pain. He tells me that he is  really been having pain all along with the wound VAC nonetheless I do feel like this has done a great job for him as far as getting the wound to fill-in we have seen overall about a 75% improvement during the time we have been utilizing  this. 02/13/2021 on evaluation today patient's wound actually showed signs of fairly good improvement which is great news. There does not appear to be any signs of active infection at this time also good news. With that being said he has been using the [facility] and that is doing a great job. He is really felt like he would like to just discontinue wound VAC which I completely understand. Objective Constitutional Well-nourished and well-hydrated in no acute distress. Vitals Time Taken: 3:34 PM, Height: 72 in, Weight: 135 lbs, BMI: 18.3, Temperature: 97.6 F, Pulse: 77 bpm, Respiratory Rate: 17 breaths/min, Blood Pressure: 132/80 mmHg. Respiratory normal breathing without difficulty. Psychiatric this patient is able to make decisions and demonstrates good insight into disease process. Alert and Oriented x 3. pleasant and cooperative. General Notes: Upon inspection patient's wound bed actually showed signs of good granulation at this time. There does not appear to be any signs of active infection which is great news and overall very pleased with where things stand today. I do think that the depth of the wound has coming tremendously and overall I think he is doing quite well. Integumentary (Hair, Skin) Wound #3 status is Open. Original cause of wound was Surgical Injury. The wound is located on the Left,Medial Amputation Site - Above Knee. The wound measures 0.8cm length x 2.9cm width x 0.7cm depth; 1.822cm^2 area and 1.275cm^3 volume. There is Fat Layer (Subcutaneous Tissue) exposed. There is no tunneling or undermining noted. There is a large amount of serosanguineous drainage noted. The wound margin is well defined and not attached to the wound base. There is large (67-100%) pink granulation within the wound bed. There is a small (1-33%) amount of necrotic tissue within the wound bed including Adherent Slough. Assessment Active Problems ICD-10 Acquired absence of left leg above  knee Disruption of external operation (surgical) wound, not elsewhere classified, initial encounter Atherosclerotic heart disease of native coronary artery without angina pectoris Other specified peripheral vascular diseases Essential (primary) hypertension Plan Follow-up Appointments: Return Appointment in 2 weeks. Bathing/ Shower/ Hygiene: May shower and wash wound with soap and water. Negative Presssure Wound Therapy: Wound Vac to wound continuously at 15mm/hg pressure - Discontinue VAC Home Health: No change in wound care orders this week; continue Home Health for wound care. May utilize formulary equivalent dressing for wound treatment orders unless otherwise specified. Other Home Health Orders/Instructions: - Interim WOUND #3: - Amputation Site - Above Knee Wound Laterality: Left, Medial Cleanser: Wound Cleanser 3 x Per Week/30 Days Discharge Instructions: Cleanse the wound with wound cleanser prior to applying a clean dressing using gauze sponges, not tissue or cotton balls. Prim Dressing: Hydrofera Blue Classic Foam, 2x2 in (Home Health) 3 x Per Week/30 Days ary Discharge Instructions: Moisten with saline prior to applying to wound bed, cut to fit inside wound edges to fill space Prim Dressing: Saline moistened gauze 3 x Per Week/30 Days ary Discharge Instructions: Apply saline moistened gauze in clinic Secondary Dressing: ComfortFoam Border, 6x6 in (silicone border) ([place]) 3 x Per Week/30 Days Discharge Instructions: Apply over primary dressing in clinic 1. Would recommend that we continue with the [facility] we will discontinue the wound VAC at this point. 2. Also can recommend  covering this with a protective dressing. 3. We will also have the patient follow-up in 2 weeks see where things stand. If anything changes or worsens in the meantime to contact the office and let me know. We will see patient back for reevaluation in 2 weeks here in the clinic. If anything  worsens or changes patient will contact our office for additional recommendations. Electronic Signature(s) Signed: 02/13/2021 5:06:38 PM By: Worthy Keeler PA-C Entered By: Worthy Keeler on 02/13/2021 17:06:38 -------------------------------------------------------------------------------- SuperBill Details Patient Name: Date of Service: Mike [name] S. 02/13/2021 Medical Record Number: [id number] Patient Account Number: [id number] Date of Birth/Sex: Treating RN: 1961/03/06 (60 y.o. [name] Primary Care Provider: PA Haig Prophet, [place] Other Clinician: Referring Provider: Treating Provider/Extender: Mardella Layman, Manish Weeks in Treatment: 11 Diagnosis Coding ICD-10 Codes Code Description [phone] Acquired absence of left leg above knee T81.31XA Disruption of external operation (surgical) wound, not elsewhere classified, initial encounter I25.10 Atherosclerotic heart disease of native coronary artery without angina pectoris I73.89 Other specified peripheral vascular diseases I10 Essential (primary) hypertension Facility Procedures CPT4 Code: [id number] Description: 99213 - WOUND CARE VISIT-LEV 3 EST PT Modifier: Quantity: 1 Physician Procedures : CPT4 Code Description Modifier [id number] 20233 - WC PHYS LEVEL 3 - EST PT ICD-10 Diagnosis Description Z89.612 Acquired absence of left leg above knee T81.31XA Disruption of external operation (surgical) wound, not elsewhere classified, initial encounter  I25.10 Atherosclerotic heart disease of native coronary artery without angina pectoris I73.89 Other specified peripheral vascular diseases Quantity: 1 Electronic Signature(s) Signed: 02/13/2021 5:06:51 PM By: Worthy Keeler PA-C Entered By: Worthy Keeler on 02/13/2021 17:06:51

## 2021-02-15 DIAGNOSIS — I6309 Cerebral infarction due to thrombosis of other precerebral artery: Secondary | ICD-10-CM | POA: Diagnosis not present

## 2021-02-15 DIAGNOSIS — E785 Hyperlipidemia, unspecified: Secondary | ICD-10-CM | POA: Diagnosis not present

## 2021-02-15 DIAGNOSIS — N179 Acute kidney failure, unspecified: Secondary | ICD-10-CM | POA: Diagnosis not present

## 2021-02-15 DIAGNOSIS — I340 Nonrheumatic mitral (valve) insufficiency: Secondary | ICD-10-CM | POA: Diagnosis not present

## 2021-02-15 DIAGNOSIS — I5020 Unspecified systolic (congestive) heart failure: Secondary | ICD-10-CM | POA: Diagnosis not present

## 2021-02-15 DIAGNOSIS — Z89612 Acquired absence of left leg above knee: Secondary | ICD-10-CM | POA: Diagnosis not present

## 2021-02-15 DIAGNOSIS — R2689 Other abnormalities of gait and mobility: Secondary | ICD-10-CM | POA: Diagnosis not present

## 2021-02-15 DIAGNOSIS — I739 Peripheral vascular disease, unspecified: Secondary | ICD-10-CM | POA: Diagnosis not present

## 2021-02-15 DIAGNOSIS — Z720 Tobacco use: Secondary | ICD-10-CM | POA: Diagnosis not present

## 2021-02-15 DIAGNOSIS — M6281 Muscle weakness (generalized): Secondary | ICD-10-CM | POA: Diagnosis not present

## 2021-02-15 DIAGNOSIS — I10 Essential (primary) hypertension: Secondary | ICD-10-CM | POA: Diagnosis not present

## 2021-02-15 DIAGNOSIS — I213 ST elevation (STEMI) myocardial infarction of unspecified site: Secondary | ICD-10-CM | POA: Diagnosis not present

## 2021-02-15 DIAGNOSIS — R2990 Unspecified symptoms and signs involving the nervous system: Secondary | ICD-10-CM | POA: Diagnosis not present

## 2021-02-15 NOTE — Progress Notes (Signed)
Mike [name] ([id number]) Visit Report for 02/13/2021 Arrival Information Details Patient Name: Date of Service: Mike [name] 02/13/2021 3:15 PM Medical Record Number: [id number] Patient Account Number: [id number] Date of Birth/Sex: Treating RN: 1961/03/02 (60 y.o. Mike [name], [name] Primary Care Kita Neace: PA TIENT, NO Other Clinician: Referring Balraj Brayfield: Treating Evelyn Moch/Extender: Mardella Layman, Manish Weeks in Treatment: 11 Visit Information History Since Last Visit Added or deleted any medications: No Patient Arrived: Wheel Chair Any new allergies or adverse reactions: No Arrival Time: 15:29 Had a fall or experienced change in No Accompanied By: self activities of daily living that may affect Transfer Assistance: None risk of falls: Patient Identification Verified: Yes Signs or symptoms of abuse/neglect since last visito No Secondary Verification Process Completed: Yes Hospitalized since last visit: No Patient Requires Transmission-Based Precautions: No Implantable device outside of the clinic excluding No Patient Has Alerts: Yes cellular tissue based products placed in the center Patient Alerts: Patient on Blood Thinner since last visit: Has Dressing in Place as Prescribed: Yes Pain Present Now: No Electronic Signature(s) Signed: 02/15/2021 5:09:47 PM By: Rhae Hammock RN Entered By: Rhae Hammock on 02/13/2021 15:31:56 -------------------------------------------------------------------------------- Clinic Level of Care Assessment Details Patient Name: Date of Service: Mike [name] 02/13/2021 3:15 PM Medical Record Number: [id number] Patient Account Number: [id number] Date of Birth/Sex: Treating RN: July 19, 1961 (60 y.o. Mike [name] Primary Care Shandale Malak: PA Haig Prophet, NO Other Clinician: Referring Arcangel Minion: Treating Beatrice Ziehm/Extender: Mardella Layman, Manish Weeks in Treatment: 11 Clinic Level of Care Assessment  Items TOOL 4 Quantity Score _0  - 0 Use when only an EandM is performed on FOLLOW-UP visit ASSESSMENTS - Nursing Assessment / Reassessment X- 1 10 Reassessment of Co-morbidities (includes updates in patient status) X- 1 5 Reassessment of Adherence to Treatment Plan ASSESSMENTS - Wound and Skin A ssessment / Reassessment X - Simple Wound Assessment / Reassessment - one wound 1 5 _1  - 0 Complex Wound Assessment / Reassessment - multiple wounds _2  - 0 Dermatologic / Skin Assessment (not related to wound area) ASSESSMENTS - Focused Assessment _3  - 0 Circumferential Edema Measurements - multi extremities _4  - 0 Nutritional Assessment / Counseling / Intervention _5  - 0 Lower Extremity Assessment (monofilament, tuning fork, pulses) _6  - 0 Peripheral Arterial Disease Assessment (using hand held doppler) ASSESSMENTS - Ostomy and/or Continence Assessment and Care _7  - 0 Incontinence Assessment and Management _8  - 0 Ostomy Care Assessment and Management (repouching, etc.) PROCESS - Coordination of Care X - Simple Patient / Family Education for ongoing care 1 15 _9  - 0 Complex (extensive) Patient / Family Education for ongoing care X- 1 10 Staff obtains Programmer, systems, Records, T Results / Process Orders est X- 1 10 Staff telephones HHA, Nursing Homes / Clarify orders / etc _10  - 0 Routine Transfer to another Facility (non-emergent condition) _11  - 0 Routine Hospital Admission (non-emergent condition) _12  - 0 New Admissions / Biomedical engineer / Ordering NPWT Apligraf, etc. , _13  - 0 Emergency Hospital Admission (emergent condition) X- 1 10 Simple Discharge Coordination _14  - 0 Complex (extensive) Discharge Coordination PROCESS - Special Needs _15  - 0 Pediatric / Minor Patient Management _16  - 0 Isolation Patient Management _17  - 0 Hearing / Language / Visual special needs _18  - 0 Assessment of Community assistance (transportation, D/C planning, etc.) _19  - 0 Additional  assistance / Altered mentation _20  - 0 Support Surface(s) Assessment (bed, cushion, seat, etc.) INTERVENTIONS - Wound Cleansing / Measurement X - Simple Wound Cleansing - one  wound 1 5 _0  - 0 Complex Wound Cleansing - multiple wounds X- 1 5 Wound Imaging (photographs - any number of wounds) _1  - 0 Wound Tracing (instead of photographs) X- 1 5 Simple Wound Measurement - one wound _2  - 0 Complex Wound Measurement - multiple wounds INTERVENTIONS - Wound Dressings X - Small Wound Dressing one or multiple wounds 1 10 _3  - 0 Medium Wound Dressing one or multiple wounds _4  - 0 Large Wound Dressing one or multiple wounds X- 1 5 Application of Medications - topical <[id number]>_8<\/EXHBZJIRCVELFYBO>_1  - 0 Application of Medications - injection INTERVENTIONS - Miscellaneous _6  - 0 External ear exam _7  - 0 Specimen Collection (cultures, biopsies, blood, body fluids, etc.) _8  - 0 Specimen(s) / Culture(s) sent or taken to Lab for analysis _9  - 0 Patient Transfer (multiple staff / Civil Service fast streamer / Similar devices) _10  - 0 Simple Staple / Suture removal (25 or less) _11  - 0 Complex Staple / Suture removal (26 or more) _12  - 0 Hypo / Hyperglycemic Management (close monitor of Blood Glucose) _13  - 0 Ankle / Brachial Index (ABI) - do not check if billed separately X- 1 5 Vital Signs Has the patient been seen at the hospital within the last three years: Yes Total Score: 100 Level Of Care: New/Established - Level 3 Electronic Signature(s) Signed: 02/13/2021 6:01:36 PM By: Baruch Gouty RN, [name] Entered By: Baruch Gouty on 02/13/2021 17:00:55 -------------------------------------------------------------------------------- Encounter Discharge Information Details Patient Name: Date of Service: [name] 02/13/2021 3:15 PM Medical Record Number: [id number] Patient Account Number: [id number] Date of Birth/Sex: Treating RN: [birth year] (60 y.o. Erie Noe Primary Care Tekelia Kareem: PA Haig Prophet, NO Other  Clinician: Referring Kriston Mckinnie: Treating Mariluz Crespo/Extender: Mardella Layman, Manish Weeks in Treatment: 11 Encounter Discharge Information Items Discharge Condition: Stable Ambulatory Status: Wheelchair Discharge Destination: Home Transportation: Private Auto Accompanied By: self Schedule Follow-up Appointment: Yes Clinical Summary of Care: Patient Declined Electronic Signature(s) Signed: 02/15/2021 5:09:47 PM By: Rhae Hammock RN Entered By: Rhae Hammock on 02/13/2021 17:21:38 -------------------------------------------------------------------------------- Lower Extremity Assessment Details Patient Name: Date of Service: [name] 02/13/2021 3:15 PM Medical Record Number: [id number] Patient Account Number: [id number] Date of Birth/Sex: Treating RN: [birth year] (60 y.o. Erie Noe Primary Care Antawan Mchugh: PA Haig Prophet, NO Other Clinician: Referring Braelynn Benning: Treating Sheryle Vice/Extender: Mardella Layman, Manish Weeks in Treatment: 11 Electronic Signature(s) Signed: 02/15/2021 5:09:47 PM By: Rhae Hammock RN Entered By: Rhae Hammock on 02/13/2021 15:34:44 -------------------------------------------------------------------------------- Multi-Disciplinary Care Plan Details Patient Name: Date of Service: [name] 02/13/2021 3:15 PM Medical Record Number: [id number] Patient Account Number: [id number] Date of Birth/Sex: Treating RN: [birth year] (60 y.o. Mike [name] Primary Care Dejion Grillo: PA Haig Prophet, NO Other Clinician: Referring Cherilyn Sautter: Treating Myalynn Lingle/Extender: Mardella Layman, Manish Weeks in Treatment: 11 Active Inactive Wound/Skin Impairment Nursing Diagnoses: Impaired tissue integrity Knowledge deficit related to ulceration/compromised skin integrity Goals: Patient/caregiver will verbalize understanding of skin care regimen Date Initiated: 11/28/2020 Target Resolution Date: 03/13/2021 Goal  Status: Active Ulcer/skin breakdown will have a volume reduction of 30% by week 4 Date Initiated: 11/28/2020 Date Inactivated: 12/19/2020 Target Resolution Date: 12/26/2020 Goal Status: Met Interventions: Assess patient/caregiver ability to obtain necessary supplies Assess patient/caregiver ability to perform ulcer/skin care regimen upon admission and as needed Assess ulceration(s) every visit Provide education on ulcer and skin care Treatment Activities: Skin care regimen initiated : 11/28/2020 Topical wound management initiated : 11/28/2020 Notes: Electronic Signature(s) Signed: 02/13/2021 6:01:36 PM By: Baruch Gouty RN, [name] Entered  By: Baruch Gouty on 02/13/2021 16:59:53 -------------------------------------------------------------------------------- Pain Assessment Details Patient Name: Date of Service: Mike [name]. 02/13/2021 3:15 PM Medical Record Number: [id number] Patient Account Number: [id number] Date of Birth/Sex: Treating RN: [birth year] (60 y.o. Mike [name], [name] Primary Care Jeyren Danowski: PA Haig Prophet, NO Other Clinician: Referring Jerron Niblack: Treating Gwendola Hornaday/Extender: Mardella Layman, Manish Weeks in Treatment: 11 Active Problems Location of Pain Severity and Description of Pain Patient Has Paino No Site Locations Pain Management and Medication Current Pain Management: Electronic Signature(s) Signed: 02/15/2021 5:09:47 PM By: Rhae Hammock RN Entered By: Rhae Hammock on 02/13/2021 15:34:38 -------------------------------------------------------------------------------- Patient/Caregiver Education Details Patient Name: Date of Service: Mike [name] 2/16/2022andnbsp3:15 PM Medical Record Number: [id number] Patient Account Number: [id number] Date of Birth/Gender: Treating RN: [birth year] (60 y.o. Mike [name] Primary Care Physician: PA Haig Prophet, NO Other Clinician: Referring Physician: Treating Physician/Extender: Mardella Layman, Manish Weeks in Treatment: 11 Education Assessment Education Provided To: Patient Education Topics Provided Wound/Skin Impairment: Methods: Explain/Verbal Responses: Reinforcements needed, State content correctly [employer]) Signed: 02/13/2021 6:01:36 PM By: Baruch Gouty RN, [name] Entered By: Baruch Gouty on 02/13/2021 17:00:09 -------------------------------------------------------------------------------- Wound Assessment Details Patient Name: Date of Service: [name] 02/13/2021 3:15 PM Medical Record Number: [id number] Patient Account Number: [id number] Date of Birth/Sex: Treating RN: 11/16/1961 (60 y.o. Mike [name], [name] Primary Care Lynze Reddy: PA TIENT, NO Other Clinician: Referring Rakwon Letourneau: Treating Talaysha Freeberg/Extender: Mardella Layman, Manish Weeks in Treatment: 11 Wound Status Wound Number: 3 Primary Dehisced Wound Etiology: Wound Location: Left, Medial Amputation Site - Above Knee Wound Open Wounding Event: Surgical Injury Status: Date Acquired: 11/06/2020 Comorbid Sleep Apnea, Coronary Artery Disease, Hypotension, Myocardial Weeks Of Treatment: 8 History: Infarction, Peripheral Arterial Disease Clustered Wound: No Wound Measurements Length: (cm) 0.8 Width: (cm) 2.9 Depth: (cm) 0.7 Area: (cm) 1.822 Volume: (cm) 1.275 % Reduction in Area: 84% % Reduction in Volume: 88.8% Epithelialization: Small (1-33%) Tunneling: No Undermining: No Wound Description Classification: Full Thickness Without Exposed Support Structures Wound Margin: Well defined, not attached Exudate Amount: Large Exudate Type: Serosanguineous Exudate Color: red, brown Foul Odor After Cleansing: No Slough/Fibrino Yes Wound Bed Granulation Amount: Large (67-100%) Exposed Structure Granulation Quality: Pink Fascia Exposed: No Necrotic Amount: Small (1-33%) Fat Layer (Subcutaneous Tissue) Exposed: Yes Necrotic Quality:  Adherent Slough Tendon Exposed: No Muscle Exposed: No Joint Exposed: No Bone Exposed: No Treatment Notes Wound #3 (Amputation Site - Above Knee) Wound Laterality: Left, Medial Cleanser Wound Cleanser Discharge Instruction: Cleanse the wound with wound cleanser prior to applying a clean dressing using gauze sponges, not tissue or cotton balls. Peri-Wound Care Topical Primary Dressing Hydrofera Blue Classic Foam, 2x2 in Discharge Instruction: Moisten with saline prior to applying to wound bed, cut to fit inside wound edges to fill space Saline moistened gauze Discharge Instruction: Apply saline moistened gauze in clinic Secondary Dressing ComfortFoam Border, 6x6 in (silicone border) Discharge Instruction: Apply over primary dressing in clinic Secured With Compression Wrap Compression Stockings Add-Ons Electronic Signature(s) Signed: 02/15/2021 5:09:47 PM By: Rhae Hammock RN Entered By: Rhae Hammock on 02/13/2021 15:37:39 -------------------------------------------------------------------------------- [place] Details Patient Name: Date of Service: [name] 02/13/2021 3:15 PM Medical Record Number: [id number] Patient Account Number: [id number] Date of Birth/Sex: Treating RN: Jan 07, 1961 (60 y.o. Erie Noe Primary Care Tommi Crepeau: PA Haig Prophet, NO Other Clinician: Referring Nery Kalisz: Treating Takeyla Million/Extender: Mardella Layman, Manish Weeks in Treatment: 11 Vital Signs Time Taken: 15:34 Temperature (F): 97.6 Height (in): 72 Pulse (bpm): 77 Weight (  lbs): 135 Respiratory Rate (breaths/min): 17 Body Mass Index (BMI): 18.3 Blood Pressure (mmHg): 132/80 Reference Range: 80 - 120 mg / dl Electronic Signature(s) Signed: 02/15/2021 5:09:47 PM By: Rhae Hammock RN Entered By: Rhae Hammock on 02/13/2021 15:34:32

## 2021-02-18 DIAGNOSIS — E785 Hyperlipidemia, unspecified: Secondary | ICD-10-CM | POA: Diagnosis not present

## 2021-02-18 DIAGNOSIS — R2689 Other abnormalities of gait and mobility: Secondary | ICD-10-CM | POA: Diagnosis not present

## 2021-02-18 DIAGNOSIS — I213 ST elevation (STEMI) myocardial infarction of unspecified site: Secondary | ICD-10-CM | POA: Diagnosis not present

## 2021-02-18 DIAGNOSIS — M6281 Muscle weakness (generalized): Secondary | ICD-10-CM | POA: Diagnosis not present

## 2021-02-18 DIAGNOSIS — R2990 Unspecified symptoms and signs involving the nervous system: Secondary | ICD-10-CM | POA: Diagnosis not present

## 2021-02-18 DIAGNOSIS — Z4801 Encounter for change or removal of surgical wound dressing: Secondary | ICD-10-CM | POA: Diagnosis not present

## 2021-02-18 DIAGNOSIS — I340 Nonrheumatic mitral (valve) insufficiency: Secondary | ICD-10-CM | POA: Diagnosis not present

## 2021-02-18 DIAGNOSIS — T8131XD Disruption of external operation (surgical) wound, not elsewhere classified, subsequent encounter: Secondary | ICD-10-CM | POA: Diagnosis not present

## 2021-02-18 DIAGNOSIS — Z89612 Acquired absence of left leg above knee: Secondary | ICD-10-CM | POA: Diagnosis not present

## 2021-02-18 DIAGNOSIS — Z720 Tobacco use: Secondary | ICD-10-CM | POA: Diagnosis not present

## 2021-02-18 DIAGNOSIS — I739 Peripheral vascular disease, unspecified: Secondary | ICD-10-CM | POA: Diagnosis not present

## 2021-02-18 DIAGNOSIS — I10 Essential (primary) hypertension: Secondary | ICD-10-CM | POA: Diagnosis not present

## 2021-02-18 DIAGNOSIS — N179 Acute kidney failure, unspecified: Secondary | ICD-10-CM | POA: Diagnosis not present

## 2021-02-18 DIAGNOSIS — I6309 Cerebral infarction due to thrombosis of other precerebral artery: Secondary | ICD-10-CM | POA: Diagnosis not present

## 2021-02-18 DIAGNOSIS — I5020 Unspecified systolic (congestive) heart failure: Secondary | ICD-10-CM | POA: Diagnosis not present

## 2021-02-19 ENCOUNTER — Inpatient Hospital Stay: Payer: BC Managed Care – PPO | Admitting: Neurology

## 2021-02-20 ENCOUNTER — Encounter: Payer: Self-pay | Admitting: Cardiology

## 2021-02-20 ENCOUNTER — Telehealth: Payer: BC Managed Care – PPO | Admitting: Cardiology

## 2021-02-20 ENCOUNTER — Other Ambulatory Visit: Payer: Self-pay

## 2021-02-20 VITALS — BP 138/73 | Ht 72.0 in | Wt 140.0 lb

## 2021-02-20 DIAGNOSIS — E785 Hyperlipidemia, unspecified: Secondary | ICD-10-CM | POA: Diagnosis not present

## 2021-02-20 DIAGNOSIS — T8131XD Disruption of external operation (surgical) wound, not elsewhere classified, subsequent encounter: Secondary | ICD-10-CM | POA: Diagnosis not present

## 2021-02-20 DIAGNOSIS — I739 Peripheral vascular disease, unspecified: Secondary | ICD-10-CM | POA: Diagnosis not present

## 2021-02-20 DIAGNOSIS — I5020 Unspecified systolic (congestive) heart failure: Secondary | ICD-10-CM

## 2021-02-20 DIAGNOSIS — Z89612 Acquired absence of left leg above knee: Secondary | ICD-10-CM | POA: Diagnosis not present

## 2021-02-20 DIAGNOSIS — I340 Nonrheumatic mitral (valve) insufficiency: Secondary | ICD-10-CM | POA: Diagnosis not present

## 2021-02-20 DIAGNOSIS — I6340 Cerebral infarction due to embolism of unspecified cerebral artery: Secondary | ICD-10-CM | POA: Diagnosis not present

## 2021-02-20 DIAGNOSIS — I10 Essential (primary) hypertension: Secondary | ICD-10-CM | POA: Diagnosis not present

## 2021-02-20 DIAGNOSIS — M6281 Muscle weakness (generalized): Secondary | ICD-10-CM | POA: Diagnosis not present

## 2021-02-20 DIAGNOSIS — I2510 Atherosclerotic heart disease of native coronary artery without angina pectoris: Secondary | ICD-10-CM | POA: Diagnosis not present

## 2021-02-20 DIAGNOSIS — Z4801 Encounter for change or removal of surgical wound dressing: Secondary | ICD-10-CM | POA: Diagnosis not present

## 2021-02-20 DIAGNOSIS — R2689 Other abnormalities of gait and mobility: Secondary | ICD-10-CM | POA: Diagnosis not present

## 2021-02-20 DIAGNOSIS — I280 Arteriovenous fistula of pulmonary vessels: Secondary | ICD-10-CM | POA: Insufficient documentation

## 2021-02-20 DIAGNOSIS — N179 Acute kidney failure, unspecified: Secondary | ICD-10-CM | POA: Diagnosis not present

## 2021-02-20 DIAGNOSIS — R2990 Unspecified symptoms and signs involving the nervous system: Secondary | ICD-10-CM | POA: Diagnosis not present

## 2021-02-20 DIAGNOSIS — Z720 Tobacco use: Secondary | ICD-10-CM | POA: Diagnosis not present

## 2021-02-20 DIAGNOSIS — I6309 Cerebral infarction due to thrombosis of other precerebral artery: Secondary | ICD-10-CM | POA: Diagnosis not present

## 2021-02-20 DIAGNOSIS — I213 ST elevation (STEMI) myocardial infarction of unspecified site: Secondary | ICD-10-CM | POA: Diagnosis not present

## 2021-02-20 MED ORDER — ENTRESTO 49-51 MG PO TABS: 1.0000 | ORAL_TABLET | Freq: Two times a day (BID) | ORAL | 2 refills | Status: DC

## 2021-02-20 NOTE — Progress Notes (Signed)
Follow up visit  Subjective:   Mike [name], male    DOB: [birth year], 60 y.o.   MRN: [id number]   I connected with the patient on 02/20/2021 by a video enabled telemedicine application and verified that I am speaking with the correct person using two identifiers.     I discussed the limitations of evaluation and management by telemedicine and the availability of in person appointments. The patient expressed understanding and agreed to proceed.   This visit type was conducted due to national recommendations for restrictions regarding the COVID-19 Pandemic (e.g. social distancing).  This format is felt to be most appropriate for this patient at this time.  All issues noted in this document were discussed and addressed.  No physical exam was performed (except for noted visual exam findings with Tele health visits).  The patient has consented to conduct a Tele health visit and understands insurance will be billed.    HPI   Chief Complaint  Patient presents with  . HFrEF  . Follow-up    2-3 week    60 y.o.Caucasian male with CAD, culprit (RCA) and nonculprit (LCx) PCI after STEMI 07/2020, HFrEF, moderate MR, h/o cardiac arrest 2/2 Torsades post PCI during index hospitalization, PAD with critical limb ischemia s/p Rt iliac revascularization, and s/p Lt AKA amputation for critical limb ischemia,  severe bilateral renal artery stenoses w/h/o CIN (09/2020), multifocal stroke s/p tPA with resolution of neurodeficit (11/2020), large AV pulmonary fistula and small PFO.  Patient is doing well. He has tolerate Entresto well without any lightheadedness symptoms. Reviewed recent lab results with the patient, details below. No bleeding issues. He is awaiting further communication from Dr Margaretmary Dys office re: pulmonary AV fistula closure.   His wound vac is now off. He has regular f/u w/wound clinic, and hopes to get the prosthesis next week.    Current Outpatient Medications on File Prior to Visit   Medication Sig Dispense Refill  . aspirin EC 81 MG tablet Take 1 tablet (81 mg total) by mouth daily. Swallow whole. 90 tablet 0  . clopidogrel (PLAVIX) 75 MG tablet TAKE 1 TABLET(75 MG) BY MOUTH DAILY 30 tablet 6  . furosemide (LASIX) 20 MG tablet TAKE 1 TABLET(20 MG) BY MOUTH DAILY 30 tablet 0  . gabapentin (NEURONTIN) 300 MG capsule Take 1 capsule (300 mg total) by mouth 2 (two) times daily. (Patient taking differently: Take 300-600 mg by mouth See admin instructions. Take 1 capsule (300 mg totally) by mouth in the morning; take 2 capsules (600 mg totally) by mouth at bed time) 60 capsule 0  . metoprolol succinate (TOPROL-XL) 25 MG 24 hr tablet Take 1 tablet (25 mg total) by mouth daily. Take with or immediately following a meal. 90 tablet 3  . nitroGLYCERIN (NITROSTAT) 0.4 MG SL tablet Place 1 tablet (0.4 mg total) under the tongue every 5 (five) minutes as needed for chest pain. (Patient taking differently: Place 0.4 mg under the tongue every 5 (five) minutes as needed for chest pain (max 3 doses).) 30 tablet 1  . rivaroxaban (XARELTO) 2.5 MG TABS tablet Take 1 tablet (2.5 mg total) by mouth 2 (two) times daily. 60 tablet 3  . rosuvastatin (CRESTOR) 10 MG tablet TAKE 1 TABLET(10 MG) BY MOUTH DAILY 30 tablet 0  . sacubitril-valsartan (ENTRESTO) 24-26 MG Take 1 tablet by mouth 2 (two) times daily. 60 tablet 1   No current facility-administered medications on file prior to visit.    Cardiovascular & other pertient studies:  Mobile cardiac telemetry 14 days 12/20/2020 - 01/03/2021:  Dominant rhythm: Sinus.  HR 31-167 bpm. Avg HR 76 bpm.  14 episodes of SVT/atrial tachycardia, fastest at 167 bpm, longest for 18.7 sec  <1%% isolated SVE, couplet/triplets.  6 episodes of VT/idioventricular rhythm, fastest at 156 bpm, longest for 10 beats  8% isolated VE, 40 sec bigeminy, 24m17 sec trigeminy.  2 episodes of sinus pauses, 4-4.6 sec noted during sleep hours.  No atrial fibrillation/atrial  flutter//high grade AV block  0 patient triggered events.   Echocardiogram 01/21/2021:  Left ventricle cavity is normal in size. Mild concentric hypertrophy of  the left ventricle. Left ventricle regional wall motion findings: Basal  inferolateral and Basal inferior akinesis, mild global hypokinesis.   Moderately depressed LV systolic function with visual EF 35-40%. Doppler  evidence of grade I (impaired) diastolic dysfunction, normal LAP.  Left atrial cavity is moderately dilated.  Structurally normal mitral valve. No evidence of mitral stenosis. Moderate  to severe secondary mitral regurgitation due basal inferior akinesis.  Mild tricuspid regurgitation.  No evidence of pulmonary hypertension.  Compared to previous study on 09/11/2020, LVEF appears improved from  15-20%. Mitral regurgitation is increased in severity from mild to  moderate.   EKG 12/20/2020: Sinus rhythm 82 bpm Old inferior infarct Nonspecific ST depression   CTA Chest 12/14/2020: 1. Large 3.1 x 1.9 cm pulmonary arteriovenous fistula in the medial left lower lobe. 2. Right middle lobe 3 mm solid pulmonary nodule. Follow-up noncontrast chest CT recommended in 12 months in this high risk patient.This recommendation follows the consensus statement: Guidelines for Management of Incidental Pulmonary Nodules Detected on CT Images: From the Fleischner Society 2017; Radiology 2017; 284:228-243. 3. Mild cardiomegaly. 4. Aortic Atherosclerosis (ICD10-I70.0) and Emphysema (ICD10-J43.9).   PV intervention 10/16/2020: Successful intravascular lithotripsy, PTCA and stenting 7.0X39 mm balloon expandable Viabahn VBX stent Rt common iliac artery  7.0X60 mm self expanding Absolute Pro stent Rt distal iliac artery  No named vessels at ankle level in both lower extremities.  Will consult vascular surgery for Rt fem-to-left profunda bypass given left foot critical limb ischemia.  Abdominal Aortic Duplex 09/21/2020:   Moderate plaque noted in the proximal, mid and distal aorta. There is an  ulcerated plaque noted in the distal abdominal aorta. No AAA. Normal iliac  artery velocity.  Lower Extremity Arterial Duplex 09/21/2020:  The right SFA is occluded in the proximal segment with reconstitution at  the level of the popliteal artery with diffuse monophasic waveform below  the knee. Right profunda femoral artery has >50% stenosis. There is  moderate mixed plaque noted throughout the right lower extremity.  Monophasic waveform throughout the left lower extremity, indicates  significant proximal disease (iliac artery).   Left SFA is occluded in the proximal segment and reconstitutes just above popliteal artery and diffuse dampened monophasic waveform throughout the lower extremity below the  knee. There is moderate mixed plaque throughout the left lower extremity.   This exam reveals severely decreased perfusion of the right lower  extremity, noted at the dorsalis pedis artery level (ABI 0.42) and  critically decreased perfusion of the left lower extremity, noted at the  dorsalis pedis and post tibial artery level (ABI 0.03).   Coronary intervention 08/03/2020: LM: Distal 40% stenosis LAD: Mid 30% disease LCx: Subtotally occluded OM2, prox LCx 70% stenosis Successful percutaneous coronary intervention OM2-Prox LCx PTCA and overlapping stents placement  2.5 X 38 mm and 2.5 X 18 mm Resolute Onyx drug-eluting stents 100%--->0% stenosis. TIMI flow  0-->III Small caliber distal vessel with moderate diffuse disease RCA: Prox 30% stenosis. Patent mid RCA sttent 2.5 X 26 mm Resolute Onyx drug-eluting stent  LVEDP 42 mmHg  Coronary intervention 08/01/2020: LM: Distal 30% stenosis LAD: Mid 30% disease LCx: Subtotally occluded OM2, bridging left-to-left and right-to-left collaterals RCA: Prox 30% stenosis. Mid 100% occlusion Successful percutaneous  coronary intervention mid RCA PTCA and stent placement 2.5 X 26 mm Resolute Onyx drug-eluting stent 100%--->0% stenosis. TIMI flow 0-->III   Recent labs: 01/31/2021: Glucose 118, BUN/Cr 22/1.0. EGFR 81. Na/K 145/3.8.   12/14/2020: Glucose 100, BUN/Cr 13/1.09. EGFR >60. Na/K 140/3.9.  H/H 11.6/35.5. MCV 90.6. Platelets 202 HbA1C 4.6% Chol 130, TG 71, HDL 42, LDL 74  09/25/2020: Glucose 102, BUN/Cr 18/1.16. EGFR 68. Na/K 140/4.5.  NT pro BNP 2404  09/11/2020: Glucose 114, BUN/Cr 19/1.42. EGFR 53. Na/K 136/4.7.  Chol 95, TG 76, HDL 30, LDL 49  08/04/2020: Glucose 101, BUN/Cr 19/1.08. EGFR >60. Na/K 139/4.0. Rest of the CMP normal H/H 14/42. MCV 89. Platelets 184. WBC count 14k HbA1C 5.8% Chol 178, TG 75, HDL 40, LDL 123    Review of Systems  Cardiovascular: Negative for chest pain, claudication, dyspnea on exertion, leg swelling, palpitations and syncope.  Respiratory: Negative for shortness of breath.          Vitals:   02/19/21 0918  BP: 138/73     Body mass index is 18.99 kg/m. Filed Weights   02/19/21 0918 02/20/21 0910  Weight: 140 lb (63.5 kg) 140 lb (63.5 kg)     Objective:   Physical Exam Vitals and nursing note reviewed.  Constitutional:      General: He is not in acute distress.    Appearance: He is well-developed and well-nourished.  Pulmonary:     Effort: Pulmonary effort is normal.  Musculoskeletal:     Right lower leg: No edema.  Neurological:     Mental Status: He is alert and oriented to person, place, and time.  Psychiatric:        Mood and Affect: Mood and affect normal.           Assessment & Recommendations:   60 y.o.Caucasian male with CAD, culprit (RCA) and nonculprit (LCx) PCI after STEMI 07/2020, HFrEF, moderate MR, h/o cardiac arrest 2/2 Torsades post PCI during index hospitalization, PAD with critical limb ischemia s/p Rt iliac revascularization, and s/p Lt AKA amputation for critical limb ischemia,  severe  bilateral renal artery stenoses w/h/o CIN (09/2020), stroke s/p tPA with resolution of neurodeficit (11/2020), multifocal stroke on MRI suspicious for cardioembolic source, small PFO/secundum ASD and large pulmonary AV fistula  Stroke: Multifocal stroke with small PFO/secundum ASD but large pulmonary AV fistula (11/2020) Seen by interventional radiologist Dr. Kathlene Cote, awaiting pulmonary AV fistula closure. After closure, we could perform TCD or repeat TEE or TTE with bubble study to evaluate any residual shunting.   PAD: Successful revascularization of right common iliac and right external iliac artery (09/2020) S/p Lt AKA. Currently has wound vac. Encouraged him to f/u w/Dr. Stephens Shire office re: prosthesis. No critical limb ischemia, although he has pre-ulcerative calloses on right sole.   HFrEF, secondary mitral regurgitation: Euvolumic. EF 35-40%. No ICD indicated. Currently on Entresto 24-26 mg bid, metoprolol succinate 25 mg daily. He has had sinus pauses at night, but no asymptomatic pauses during daytime. Increase Entresto to 49-51 mg bid. Check BMP in 7-10 days Will recheck echocardiogram in 05/2021.   CAD: S/p STEMI 07/2020. Currently no angina symptoms Conitnue  Aspirin/plavix, Crestor. Given severe CAD, PAD, added Xarelto 2.5 mg bid (COMPASS trial). Will give samples Continue Crestor 20 mg  F/u in 6 weeks   Nigel Mormon, MD Pager: [phone] Office: [phone]

## 2021-02-22 DIAGNOSIS — R2689 Other abnormalities of gait and mobility: Secondary | ICD-10-CM | POA: Diagnosis not present

## 2021-02-22 DIAGNOSIS — I213 ST elevation (STEMI) myocardial infarction of unspecified site: Secondary | ICD-10-CM | POA: Diagnosis not present

## 2021-02-22 DIAGNOSIS — I739 Peripheral vascular disease, unspecified: Secondary | ICD-10-CM | POA: Diagnosis not present

## 2021-02-22 DIAGNOSIS — M6281 Muscle weakness (generalized): Secondary | ICD-10-CM | POA: Diagnosis not present

## 2021-02-22 DIAGNOSIS — Z89612 Acquired absence of left leg above knee: Secondary | ICD-10-CM | POA: Diagnosis not present

## 2021-02-22 DIAGNOSIS — E785 Hyperlipidemia, unspecified: Secondary | ICD-10-CM | POA: Diagnosis not present

## 2021-02-22 DIAGNOSIS — I6309 Cerebral infarction due to thrombosis of other precerebral artery: Secondary | ICD-10-CM | POA: Diagnosis not present

## 2021-02-22 DIAGNOSIS — T8131XD Disruption of external operation (surgical) wound, not elsewhere classified, subsequent encounter: Secondary | ICD-10-CM | POA: Diagnosis not present

## 2021-02-22 DIAGNOSIS — R2990 Unspecified symptoms and signs involving the nervous system: Secondary | ICD-10-CM | POA: Diagnosis not present

## 2021-02-22 DIAGNOSIS — Z4801 Encounter for change or removal of surgical wound dressing: Secondary | ICD-10-CM | POA: Diagnosis not present

## 2021-02-22 DIAGNOSIS — I5020 Unspecified systolic (congestive) heart failure: Secondary | ICD-10-CM | POA: Diagnosis not present

## 2021-02-22 DIAGNOSIS — N179 Acute kidney failure, unspecified: Secondary | ICD-10-CM | POA: Diagnosis not present

## 2021-02-22 DIAGNOSIS — I10 Essential (primary) hypertension: Secondary | ICD-10-CM | POA: Diagnosis not present

## 2021-02-22 DIAGNOSIS — Z720 Tobacco use: Secondary | ICD-10-CM | POA: Diagnosis not present

## 2021-02-22 DIAGNOSIS — I340 Nonrheumatic mitral (valve) insufficiency: Secondary | ICD-10-CM | POA: Diagnosis not present

## 2021-02-25 DIAGNOSIS — I340 Nonrheumatic mitral (valve) insufficiency: Secondary | ICD-10-CM | POA: Diagnosis not present

## 2021-02-25 DIAGNOSIS — Z4801 Encounter for change or removal of surgical wound dressing: Secondary | ICD-10-CM | POA: Diagnosis not present

## 2021-02-25 DIAGNOSIS — R2689 Other abnormalities of gait and mobility: Secondary | ICD-10-CM | POA: Diagnosis not present

## 2021-02-25 DIAGNOSIS — T8131XD Disruption of external operation (surgical) wound, not elsewhere classified, subsequent encounter: Secondary | ICD-10-CM | POA: Diagnosis not present

## 2021-02-25 DIAGNOSIS — Z720 Tobacco use: Secondary | ICD-10-CM | POA: Diagnosis not present

## 2021-02-25 DIAGNOSIS — I5020 Unspecified systolic (congestive) heart failure: Secondary | ICD-10-CM | POA: Diagnosis not present

## 2021-02-25 DIAGNOSIS — M6281 Muscle weakness (generalized): Secondary | ICD-10-CM | POA: Diagnosis not present

## 2021-02-25 DIAGNOSIS — R2990 Unspecified symptoms and signs involving the nervous system: Secondary | ICD-10-CM | POA: Diagnosis not present

## 2021-02-25 DIAGNOSIS — N179 Acute kidney failure, unspecified: Secondary | ICD-10-CM | POA: Diagnosis not present

## 2021-02-25 DIAGNOSIS — Z89612 Acquired absence of left leg above knee: Secondary | ICD-10-CM | POA: Diagnosis not present

## 2021-02-25 DIAGNOSIS — I739 Peripheral vascular disease, unspecified: Secondary | ICD-10-CM | POA: Diagnosis not present

## 2021-02-25 DIAGNOSIS — E785 Hyperlipidemia, unspecified: Secondary | ICD-10-CM | POA: Diagnosis not present

## 2021-02-25 DIAGNOSIS — I6309 Cerebral infarction due to thrombosis of other precerebral artery: Secondary | ICD-10-CM | POA: Diagnosis not present

## 2021-02-25 DIAGNOSIS — I10 Essential (primary) hypertension: Secondary | ICD-10-CM | POA: Diagnosis not present

## 2021-02-25 DIAGNOSIS — I213 ST elevation (STEMI) myocardial infarction of unspecified site: Secondary | ICD-10-CM | POA: Diagnosis not present

## 2021-02-27 ENCOUNTER — Encounter (HOSPITAL_BASED_OUTPATIENT_CLINIC_OR_DEPARTMENT_OTHER): Payer: BC Managed Care – PPO | Attending: Physician Assistant | Admitting: Physician Assistant

## 2021-02-27 ENCOUNTER — Other Ambulatory Visit: Payer: Self-pay

## 2021-02-27 DIAGNOSIS — Z09 Encounter for follow-up examination after completed treatment for conditions other than malignant neoplasm: Secondary | ICD-10-CM | POA: Diagnosis not present

## 2021-02-27 DIAGNOSIS — I7389 Other specified peripheral vascular diseases: Secondary | ICD-10-CM | POA: Diagnosis not present

## 2021-02-27 DIAGNOSIS — I252 Old myocardial infarction: Secondary | ICD-10-CM | POA: Diagnosis not present

## 2021-02-27 DIAGNOSIS — Z89612 Acquired absence of left leg above knee: Secondary | ICD-10-CM | POA: Diagnosis not present

## 2021-02-27 DIAGNOSIS — Y835 Amputation of limb(s) as the cause of abnormal reaction of the patient, or of later complication, without mention of misadventure at the time of the procedure: Secondary | ICD-10-CM | POA: Diagnosis not present

## 2021-02-27 DIAGNOSIS — I2510 Atherosclerotic heart disease of native coronary artery without angina pectoris: Secondary | ICD-10-CM | POA: Diagnosis not present

## 2021-02-27 DIAGNOSIS — T8781 Dehiscence of amputation stump: Secondary | ICD-10-CM | POA: Diagnosis not present

## 2021-02-28 NOTE — Progress Notes (Signed)
TYDARIUS, YAWN ([id number]) Visit Report for 02/27/2021 SuperBill Details Patient Name: Date of Service: [name] 02/27/2021 Medical Record Number: [id number] Patient Account Number: [id number] Date of Birth/Sex: Treating RN: 02/16/1961 (61 y.o. [name] Primary Care Provider: PA TIENT, NO Other Clinician: Referring Provider: Treating Provider/Extender: Mardella Layman, Manish Weeks in Treatment: 13 Diagnosis Coding ICD-10 Codes Code Description [phone] Acquired absence of left leg above knee T81.31XA Disruption of external operation (surgical) wound, not elsewhere classified, initial encounter I25.10 Atherosclerotic heart disease of native coronary artery without angina pectoris I73.89 Other specified peripheral vascular diseases I10 Essential (primary) hypertension Facility Procedures CPT4 Code Description Modifier Quantity [id number] - WOUND CARE VISIT-LEV 3 EST PT 1 Electronic Signature(s) Signed: 02/27/2021 1:43:55 PM By: Baruch Gouty RN, [name] Signed: 02/28/2021 8:24:08 AM By: Worthy Keeler PA-C Entered By: Baruch Gouty on 02/27/2021 10:39:04

## 2021-02-28 NOTE — Progress Notes (Signed)
Mike [name] ([id number]) Visit Report for 02/27/2021 Arrival Information Details Patient Name: Date of Service: Mike [name]. 02/27/2021 9:45 A M Medical Record Number: [id number] Patient Account Number: [id number] Date of Birth/Sex: Treating RN: 1961-04-08 (60 y.o. Mike [name] Primary Care Demia Viera: PA Haig Prophet, NO Other Clinician: Referring Rica Heather: Treating Sher Hellinger/Extender: Mardella Layman, Manish Weeks in Treatment: 13 Visit Information History Since Last Visit Added or deleted any medications: No Patient Arrived: Wheel Chair Any new allergies or adverse reactions: No Arrival Time: 10:13 Had a fall or experienced change in No Accompanied By: wife activities of daily living that may affect Transfer Assistance: None risk of falls: Patient Identification Verified: Yes Signs or symptoms of abuse/neglect since last visito No Secondary Verification Process Completed: Yes Hospitalized since last visit: No Patient Requires Transmission-Based Precautions: No Implantable device outside of the clinic excluding No Patient Has Alerts: Yes cellular tissue based products placed in the center Patient Alerts: Patient on Blood Thinner since last visit: Has Dressing in Place as Prescribed: Yes Pain Present Now: No Electronic Signature(s) Signed: 02/28/2021 5:30:41 PM By: Sandre Kitty Entered By: Sandre Kitty on 02/27/2021 10:13:22 -------------------------------------------------------------------------------- Clinic Level of Care Assessment Details Patient Name: Date of Service: Mike [name] 02/27/2021 9:45 A M Medical Record Number: [id number] Patient Account Number: [id number] Date of Birth/Sex: Treating RN: 25-May-1961 (60 y.o. Mike [name] Primary Care Careli Luzader: PA Haig Prophet, NO Other Clinician: Referring Anoop Hemmer: Treating Johnattan Strassman/Extender: Mardella Layman, Manish Weeks in Treatment: 13 Clinic Level of Care Assessment  Items TOOL 4 Quantity Score []  - 0 Use when only an EandM is performed on FOLLOW-UP visit ASSESSMENTS - Nursing Assessment / Reassessment X- 1 10 Reassessment of Co-morbidities (includes updates in patient status) X- 1 5 Reassessment of Adherence to Treatment Plan ASSESSMENTS - Wound and Skin A ssessment / Reassessment X - Simple Wound Assessment / Reassessment - one wound 1 5 []  - 0 Complex Wound Assessment / Reassessment - multiple wounds []  - 0 Dermatologic / Skin Assessment (not related to wound area) ASSESSMENTS - Focused Assessment []  - 0 Circumferential Edema Measurements - multi extremities []  - 0 Nutritional Assessment / Counseling / Intervention []  - 0 Lower Extremity Assessment (monofilament, tuning fork, pulses) []  - 0 Peripheral Arterial Disease Assessment (using hand held doppler) ASSESSMENTS - Ostomy and/or Continence Assessment and Care []  - 0 Incontinence Assessment and Management []  - 0 Ostomy Care Assessment and Management (repouching, etc.) PROCESS - Coordination of Care X - Simple Patient / Family Education for ongoing care 1 15 []  - 0 Complex (extensive) Patient / Family Education for ongoing care X- 1 10 Staff obtains Programmer, systems, Records, T Results / Process Orders est X- 1 10 Staff telephones HHA, Nursing Homes / Clarify orders / etc []  - 0 Routine Transfer to another Facility (non-emergent condition) []  - 0 Routine Hospital Admission (non-emergent condition) []  - 0 New Admissions / Biomedical engineer / Ordering NPWT Apligraf, etc. , []  - 0 Emergency Hospital Admission (emergent condition) X- 1 10 Simple Discharge Coordination []  - 0 Complex (extensive) Discharge Coordination PROCESS - Special Needs []  - 0 Pediatric / Minor Patient Management []  - 0 Isolation Patient Management []  - 0 Hearing / Language / Visual special needs []  - 0 Assessment of Community assistance (transportation, D/C planning, etc.) []  - 0 Additional  assistance / Altered mentation []  - 0 Support Surface(s) Assessment (bed, cushion, seat, etc.) INTERVENTIONS - Wound Cleansing / Measurement X - Simple Wound Cleansing -  one wound 1 5 []  - 0 Complex Wound Cleansing - multiple wounds []  - 0 Wound Imaging (photographs - any number of wounds) []  - 0 Wound Tracing (instead of photographs) []  - 0 Simple Wound Measurement - one wound []  - 0 Complex Wound Measurement - multiple wounds INTERVENTIONS - Wound Dressings X - Small Wound Dressing one or multiple wounds 1 10 []  - 0 Medium Wound Dressing one or multiple wounds []  - 0 Large Wound Dressing one or multiple wounds X- 1 5 Application of Medications - topical []  - 0 Application of Medications - injection INTERVENTIONS - Miscellaneous []  - 0 External ear exam []  - 0 Specimen Collection (cultures, biopsies, blood, body fluids, etc.) []  - 0 Specimen(s) / Culture(s) sent or taken to Lab for analysis []  - 0 Patient Transfer (multiple staff / Civil Service fast streamer / Similar devices) []  - 0 Simple Staple / Suture removal (25 or less) []  - 0 Complex Staple / Suture removal (26 or more) []  - 0 Hypo / Hyperglycemic Management (close monitor of Blood Glucose) []  - 0 Ankle / Brachial Index (ABI) - do not check if billed separately X- 1 5 Vital Signs Has the patient been seen at the hospital within the last three years: Yes Total Score: 90 Level Of Care: New/Established - Level 3 Electronic Signature(s) Signed: 02/27/2021 1:43:55 PM By: Baruch Gouty RN, [name] Entered By: Baruch Gouty on 02/27/2021 10:37:24 -------------------------------------------------------------------------------- Encounter Discharge Information Details Patient Name: Date of Service: Mike [name] HN S. 02/27/2021 9:45 A M Medical Record Number: [id number] Patient Account Number: [id number] Date of Birth/Sex: Treating RN: 1961/12/04 (60 y.o. Mike [name] Primary Care Eutimio Gharibian: PA Haig Prophet, NO Other  Clinician: Referring Scout Guyett: Treating Jamise Pentland/Extender: Mardella Layman, Manish Weeks in Treatment: 13 Encounter Discharge Information Items Discharge Condition: Stable Ambulatory Status: Wheelchair Discharge Destination: Home Transportation: Private Auto Accompanied By: sister Schedule Follow-up Appointment: Yes Clinical Summary of Care: Patient Declined Electronic Signature(s) Signed: 02/27/2021 1:43:55 PM By: Baruch Gouty RN, [name] Entered By: Baruch Gouty on 02/27/2021 10:38:25 -------------------------------------------------------------------------------- Patient/Caregiver Education Details Patient Name: Date of Service: Mike [name] 3/2/2022andnbsp9:45 A M Medical Record Number: [id number] Patient Account Number: [id number] Date of Birth/Gender: Treating RN: 1961/08/27 (59 y.o. Mike [name] Primary Care Physician: PA Haig Prophet, NO Other Clinician: Referring Physician: Treating Physician/Extender: Mardella Layman, Manish Weeks in Treatment: 13 Education Assessment Education Provided To: Patient Education Topics Provided Wound/Skin Impairment: Methods: Explain/Verbal Responses: Reinforcements needed, State content correctly [employer]) Signed: 02/27/2021 1:43:55 PM By: Baruch Gouty RN, [name] Entered By: Baruch Gouty on 02/27/2021 10:38:04 -------------------------------------------------------------------------------- Wound Assessment Details Patient Name: Date of Service: Mike [name] HN S. 02/27/2021 9:45 A M Medical Record Number: [id number] Patient Account Number: [id number] Date of Birth/Sex: Treating RN: [birth year] (60 y.o. Mike [name] Primary Care Arles Rumbold: PA Haig Prophet, NO Other Clinician: Referring Crayton Savarese: Treating Eljay Lave/Extender: Mardella Layman, Manish Weeks in Treatment: 13 Wound Status Wound Number: 3 Primary Dehisced Wound Etiology: Wound Location: Left, Medial Amputation Site  - Above Knee Wound Open Wounding Event: Surgical Injury Status: Date Acquired: 11/06/2020 Comorbid Sleep Apnea, Coronary Artery Disease, Hypotension, Myocardial Weeks Of Treatment: 10 History: Infarction, Peripheral Arterial Disease Clustered Wound: No Wound Measurements Length: (cm) 0.6 Width: (cm) 2.9 Depth: (cm) 0.3 Area: (cm) 1.367 Volume: (cm) 0.41 % Reduction in Area: 88% % Reduction in Volume: 96.4% Epithelialization: Small (1-33%) Tunneling: No Undermining: No Wound Description Classification: Full Thickness Without Exposed Support Structures Wound Margin: Well defined, not attached  Exudate Amount: Medium Exudate Type: Serosanguineous Exudate Color: red, brown Foul Odor After Cleansing: No Slough/Fibrino Yes Wound Bed Granulation Amount: Large (67-100%) Exposed Structure Granulation Quality: Red Fascia Exposed: No Necrotic Amount: None Present (0%) Fat Layer (Subcutaneous Tissue) Exposed: Yes Tendon Exposed: No Muscle Exposed: No Joint Exposed: No Bone Exposed: No Treatment Notes Wound #3 (Amputation Site - Above Knee) Wound Laterality: Left, Medial Cleanser Wound Cleanser Discharge Instruction: Cleanse the wound with wound cleanser prior to applying a clean dressing using gauze sponges, not tissue or cotton balls. Peri-Wound Care Topical Primary Dressing Hydrofera Blue Classic Foam, 2x2 in Discharge Instruction: Moisten with saline prior to applying to wound bed, cut to fit inside wound edges to fill space Saline moistened gauze Discharge Instruction: Apply saline moistened gauze in clinic Secondary Dressing ComfortFoam Border, 6x6 in (silicone border) Discharge Instruction: Apply over primary dressing in clinic Secured With Compression Wrap Compression Stockings Add-Ons Electronic Signature(s) Signed: 02/27/2021 1:43:55 PM By: Baruch Gouty RN, [name] Entered By: Baruch Gouty on 02/27/2021  10:36:42 -------------------------------------------------------------------------------- [place] Details Patient Name: Date of Service: Mike [name] HN S. 02/27/2021 9:45 A M Medical Record Number: [id number] Patient Account Number: [id number] Date of Birth/Sex: Treating RN: 11/06/1961 (60 y.o. Mike [name] Primary Care Connee Ikner: PA TIENT, NO Other Clinician: Referring Chas Axel: Treating Deanne Bedgood/Extender: Mardella Layman, Manish Weeks in Treatment: 13 Vital Signs Time Taken: 10:13 Temperature (F): 98.5 Height (in): 72 Pulse (bpm): 71 Weight (lbs): 135 Respiratory Rate (breaths/min): 17 Body Mass Index (BMI): 18.3 Blood Pressure (mmHg): 109/69 Reference Range: 80 - 120 mg / dl Electronic Signature(s) Signed: 02/28/2021 5:30:41 PM By: Sandre Kitty Entered By: Sandre Kitty on 02/27/2021 10:13:41

## 2021-03-01 DIAGNOSIS — Z4801 Encounter for change or removal of surgical wound dressing: Secondary | ICD-10-CM | POA: Diagnosis not present

## 2021-03-01 DIAGNOSIS — I739 Peripheral vascular disease, unspecified: Secondary | ICD-10-CM | POA: Diagnosis not present

## 2021-03-01 DIAGNOSIS — I340 Nonrheumatic mitral (valve) insufficiency: Secondary | ICD-10-CM | POA: Diagnosis not present

## 2021-03-01 DIAGNOSIS — M6281 Muscle weakness (generalized): Secondary | ICD-10-CM | POA: Diagnosis not present

## 2021-03-01 DIAGNOSIS — T8131XD Disruption of external operation (surgical) wound, not elsewhere classified, subsequent encounter: Secondary | ICD-10-CM | POA: Diagnosis not present

## 2021-03-01 DIAGNOSIS — I5020 Unspecified systolic (congestive) heart failure: Secondary | ICD-10-CM | POA: Diagnosis not present

## 2021-03-01 DIAGNOSIS — Z720 Tobacco use: Secondary | ICD-10-CM | POA: Diagnosis not present

## 2021-03-01 DIAGNOSIS — R2990 Unspecified symptoms and signs involving the nervous system: Secondary | ICD-10-CM | POA: Diagnosis not present

## 2021-03-01 DIAGNOSIS — I213 ST elevation (STEMI) myocardial infarction of unspecified site: Secondary | ICD-10-CM | POA: Diagnosis not present

## 2021-03-01 DIAGNOSIS — N179 Acute kidney failure, unspecified: Secondary | ICD-10-CM | POA: Diagnosis not present

## 2021-03-01 DIAGNOSIS — E785 Hyperlipidemia, unspecified: Secondary | ICD-10-CM | POA: Diagnosis not present

## 2021-03-01 DIAGNOSIS — I6309 Cerebral infarction due to thrombosis of other precerebral artery: Secondary | ICD-10-CM | POA: Diagnosis not present

## 2021-03-01 DIAGNOSIS — I10 Essential (primary) hypertension: Secondary | ICD-10-CM | POA: Diagnosis not present

## 2021-03-01 DIAGNOSIS — R2689 Other abnormalities of gait and mobility: Secondary | ICD-10-CM | POA: Diagnosis not present

## 2021-03-01 DIAGNOSIS — Z89612 Acquired absence of left leg above knee: Secondary | ICD-10-CM | POA: Diagnosis not present

## 2021-03-04 DIAGNOSIS — Z720 Tobacco use: Secondary | ICD-10-CM | POA: Diagnosis not present

## 2021-03-04 DIAGNOSIS — I739 Peripheral vascular disease, unspecified: Secondary | ICD-10-CM | POA: Diagnosis not present

## 2021-03-04 DIAGNOSIS — R2689 Other abnormalities of gait and mobility: Secondary | ICD-10-CM | POA: Diagnosis not present

## 2021-03-04 DIAGNOSIS — I10 Essential (primary) hypertension: Secondary | ICD-10-CM | POA: Diagnosis not present

## 2021-03-04 DIAGNOSIS — Z4801 Encounter for change or removal of surgical wound dressing: Secondary | ICD-10-CM | POA: Diagnosis not present

## 2021-03-04 DIAGNOSIS — R2990 Unspecified symptoms and signs involving the nervous system: Secondary | ICD-10-CM | POA: Diagnosis not present

## 2021-03-04 DIAGNOSIS — I213 ST elevation (STEMI) myocardial infarction of unspecified site: Secondary | ICD-10-CM | POA: Diagnosis not present

## 2021-03-04 DIAGNOSIS — N179 Acute kidney failure, unspecified: Secondary | ICD-10-CM | POA: Diagnosis not present

## 2021-03-04 DIAGNOSIS — I6309 Cerebral infarction due to thrombosis of other precerebral artery: Secondary | ICD-10-CM | POA: Diagnosis not present

## 2021-03-04 DIAGNOSIS — E785 Hyperlipidemia, unspecified: Secondary | ICD-10-CM | POA: Diagnosis not present

## 2021-03-04 DIAGNOSIS — M6281 Muscle weakness (generalized): Secondary | ICD-10-CM | POA: Diagnosis not present

## 2021-03-04 DIAGNOSIS — Z89612 Acquired absence of left leg above knee: Secondary | ICD-10-CM | POA: Diagnosis not present

## 2021-03-04 DIAGNOSIS — I340 Nonrheumatic mitral (valve) insufficiency: Secondary | ICD-10-CM | POA: Diagnosis not present

## 2021-03-04 DIAGNOSIS — T8131XD Disruption of external operation (surgical) wound, not elsewhere classified, subsequent encounter: Secondary | ICD-10-CM | POA: Diagnosis not present

## 2021-03-04 DIAGNOSIS — I5020 Unspecified systolic (congestive) heart failure: Secondary | ICD-10-CM | POA: Diagnosis not present

## 2021-03-07 ENCOUNTER — Other Ambulatory Visit (HOSPITAL_COMMUNITY): Payer: Self-pay | Admitting: Interventional Radiology

## 2021-03-07 DIAGNOSIS — I280 Arteriovenous fistula of pulmonary vessels: Secondary | ICD-10-CM

## 2021-03-08 DIAGNOSIS — Z720 Tobacco use: Secondary | ICD-10-CM | POA: Diagnosis not present

## 2021-03-08 DIAGNOSIS — R2689 Other abnormalities of gait and mobility: Secondary | ICD-10-CM | POA: Diagnosis not present

## 2021-03-08 DIAGNOSIS — I340 Nonrheumatic mitral (valve) insufficiency: Secondary | ICD-10-CM | POA: Diagnosis not present

## 2021-03-08 DIAGNOSIS — N179 Acute kidney failure, unspecified: Secondary | ICD-10-CM | POA: Diagnosis not present

## 2021-03-08 DIAGNOSIS — I739 Peripheral vascular disease, unspecified: Secondary | ICD-10-CM | POA: Diagnosis not present

## 2021-03-08 DIAGNOSIS — T8131XD Disruption of external operation (surgical) wound, not elsewhere classified, subsequent encounter: Secondary | ICD-10-CM | POA: Diagnosis not present

## 2021-03-08 DIAGNOSIS — Z89612 Acquired absence of left leg above knee: Secondary | ICD-10-CM | POA: Diagnosis not present

## 2021-03-08 DIAGNOSIS — E785 Hyperlipidemia, unspecified: Secondary | ICD-10-CM | POA: Diagnosis not present

## 2021-03-08 DIAGNOSIS — I6309 Cerebral infarction due to thrombosis of other precerebral artery: Secondary | ICD-10-CM | POA: Diagnosis not present

## 2021-03-08 DIAGNOSIS — I213 ST elevation (STEMI) myocardial infarction of unspecified site: Secondary | ICD-10-CM | POA: Diagnosis not present

## 2021-03-08 DIAGNOSIS — Z4801 Encounter for change or removal of surgical wound dressing: Secondary | ICD-10-CM | POA: Diagnosis not present

## 2021-03-08 DIAGNOSIS — M6281 Muscle weakness (generalized): Secondary | ICD-10-CM | POA: Diagnosis not present

## 2021-03-08 DIAGNOSIS — R2990 Unspecified symptoms and signs involving the nervous system: Secondary | ICD-10-CM | POA: Diagnosis not present

## 2021-03-08 DIAGNOSIS — I10 Essential (primary) hypertension: Secondary | ICD-10-CM | POA: Diagnosis not present

## 2021-03-08 DIAGNOSIS — I5020 Unspecified systolic (congestive) heart failure: Secondary | ICD-10-CM | POA: Diagnosis not present

## 2021-03-10 ENCOUNTER — Other Ambulatory Visit: Payer: Self-pay | Admitting: Cardiology

## 2021-03-11 DIAGNOSIS — Z720 Tobacco use: Secondary | ICD-10-CM | POA: Diagnosis not present

## 2021-03-11 DIAGNOSIS — I739 Peripheral vascular disease, unspecified: Secondary | ICD-10-CM | POA: Diagnosis not present

## 2021-03-11 DIAGNOSIS — I5020 Unspecified systolic (congestive) heart failure: Secondary | ICD-10-CM | POA: Diagnosis not present

## 2021-03-11 DIAGNOSIS — N179 Acute kidney failure, unspecified: Secondary | ICD-10-CM | POA: Diagnosis not present

## 2021-03-11 DIAGNOSIS — M6281 Muscle weakness (generalized): Secondary | ICD-10-CM | POA: Diagnosis not present

## 2021-03-11 DIAGNOSIS — I6309 Cerebral infarction due to thrombosis of other precerebral artery: Secondary | ICD-10-CM | POA: Diagnosis not present

## 2021-03-11 DIAGNOSIS — I340 Nonrheumatic mitral (valve) insufficiency: Secondary | ICD-10-CM | POA: Diagnosis not present

## 2021-03-11 DIAGNOSIS — I213 ST elevation (STEMI) myocardial infarction of unspecified site: Secondary | ICD-10-CM | POA: Diagnosis not present

## 2021-03-11 DIAGNOSIS — E785 Hyperlipidemia, unspecified: Secondary | ICD-10-CM | POA: Diagnosis not present

## 2021-03-11 DIAGNOSIS — R2990 Unspecified symptoms and signs involving the nervous system: Secondary | ICD-10-CM | POA: Diagnosis not present

## 2021-03-11 DIAGNOSIS — Z89612 Acquired absence of left leg above knee: Secondary | ICD-10-CM | POA: Diagnosis not present

## 2021-03-11 DIAGNOSIS — T8131XD Disruption of external operation (surgical) wound, not elsewhere classified, subsequent encounter: Secondary | ICD-10-CM | POA: Diagnosis not present

## 2021-03-11 DIAGNOSIS — Z4801 Encounter for change or removal of surgical wound dressing: Secondary | ICD-10-CM | POA: Diagnosis not present

## 2021-03-11 DIAGNOSIS — I10 Essential (primary) hypertension: Secondary | ICD-10-CM | POA: Diagnosis not present

## 2021-03-11 DIAGNOSIS — R2689 Other abnormalities of gait and mobility: Secondary | ICD-10-CM | POA: Diagnosis not present

## 2021-03-13 ENCOUNTER — Encounter (HOSPITAL_BASED_OUTPATIENT_CLINIC_OR_DEPARTMENT_OTHER): Payer: BC Managed Care – PPO | Admitting: Physician Assistant

## 2021-03-13 ENCOUNTER — Other Ambulatory Visit: Payer: Self-pay

## 2021-03-13 DIAGNOSIS — T8781 Dehiscence of amputation stump: Secondary | ICD-10-CM | POA: Diagnosis not present

## 2021-03-13 DIAGNOSIS — Z89612 Acquired absence of left leg above knee: Secondary | ICD-10-CM | POA: Diagnosis not present

## 2021-03-13 DIAGNOSIS — T8131XA Disruption of external operation (surgical) wound, not elsewhere classified, initial encounter: Secondary | ICD-10-CM | POA: Diagnosis not present

## 2021-03-13 DIAGNOSIS — I7389 Other specified peripheral vascular diseases: Secondary | ICD-10-CM | POA: Diagnosis not present

## 2021-03-13 DIAGNOSIS — Y835 Amputation of limb(s) as the cause of abnormal reaction of the patient, or of later complication, without mention of misadventure at the time of the procedure: Secondary | ICD-10-CM | POA: Diagnosis not present

## 2021-03-13 DIAGNOSIS — I252 Old myocardial infarction: Secondary | ICD-10-CM | POA: Diagnosis not present

## 2021-03-13 DIAGNOSIS — I2510 Atherosclerotic heart disease of native coronary artery without angina pectoris: Secondary | ICD-10-CM | POA: Diagnosis not present

## 2021-03-13 DIAGNOSIS — Z09 Encounter for follow-up examination after completed treatment for conditions other than malignant neoplasm: Secondary | ICD-10-CM | POA: Diagnosis not present

## 2021-03-13 NOTE — Patient Outreach (Signed)
[facility]) Care Management  03/13/2021  Mike [name] 02-19-1961 [id number]   Telephone outreach to patient to obtain mRS was successfully completed. MRS= 2   Thank you, [name] Care Management Assistant

## 2021-03-13 NOTE — Progress Notes (Addendum)
OLUWATOMIWA, KINYON ([id number]) Visit Report for 03/13/2021 Chief Complaint Document Details Patient Name: Date of Service: [name]. 03/13/2021 10:00 A M Medical Record Number: [id number] Patient Account Number: [id number] Date of Birth/Sex: Treating RN: 04-23-1961 (60 y.o. [name] Primary Care Provider: PA Haig Prophet, [place] Other Clinician: Referring Provider: Treating Provider/Extender: Mardella Layman, Manish Weeks in Treatment: 15 Information Obtained from: Patient Chief Complaint Left gluteal pressure ulcer Electronic Signature(s) Signed: 03/13/2021 10:10:31 AM By: Worthy Keeler PA-C Entered By: Worthy Keeler on 03/13/2021 10:10:31 -------------------------------------------------------------------------------- HPI Details Patient Name: Date of Service: [name] 03/13/2021 10:00 A M Medical Record Number: [id number] Patient Account Number: [id number] Date of Birth/Sex: Treating RN: 03-Aug-1961 (60 y.o. [name] Primary Care Provider: PA Haig Prophet, [place] Other Clinician: Referring Provider: Treating Provider/Extender: Mardella Layman, Manish Weeks in Treatment: 15 History of Present Illness HPI Description: 11/28/2020 patient presents for initial evaluation here in our clinic concerning issues that he has been having with wounds actually in the left gluteal region. These occurred following his surgery and he actually had surgery which included a left above-knee amputation. Unfortunately this was necessitated by the fact that the patient had a fairly complicated recent vascular and cardiac history including a STEMI on 08/17/2020. This was complicated by cardiac arrest secondary to chronic ST elevations in the inferior leads, severe bilateral renal artery stenosis, peripheral arterial disease with critical limb ischemia requiring right iliac revascularization of the left lower extremity, chronic congestive heart failure systolic with  ischemic cardiomyopathy with complaints of shortness of breath, decreased ability to walk due to severe leg pain and again the patient had acute critical limb ischemia. He was too high of a risk being that he was on aspirin and Brilinta due to the recent STEMI and therefore was not a candidate for revascularization. Because he was decompensating so quickly it was elected to do an above-knee amputation to treat the progressing gangrene prior to this worsening significantly. This was done up by Dr. Donnetta Hutching on November 06, 2020. He did have a Foley catheter during the time that he was in the hospital. He was there from October 30, 2020 through November 15, 2020. It was during this time that he developed the wounds in the gluteal region on the left. The wounds appear to be in somewhat of a linear pattern that makes me in the suspect that he may have developed these as a result of having sat on a tubing such as a catheter tubing or something of the sort for too long causing a pressure injury although I cannot know that for sure. Not this far out. Currently the patient is on aspirin and Plavix and is currently in skilled nursing at [facility]. 12/19/2020 upon evaluation today patient appears to be doing well with regard to his gluteal wounds are completely healed. That is great news. The main issue is actually with his above-knee amputation stump on the left. He unfortunately is having trouble here with getting this to heal part of the medial aspect is eschar covered and I think this is good have to clear away in order to see this improve. I do believe however that we can initiate Santyl to try to help loosen this up in the future we should be able to debride this away much more effectively and quickly. 01/09/2021 upon evaluation today patient appears to be doing well with regard to his wound currently. He actually appears to be doing  quite well and I do believe the Santyl has been beneficial currently.  There is much looser necrotic tissue noted in the wound bed we are going to need to clean this out I do think a wound VAC may likely be ideal for him in the situation 01/16/2021 upon evaluation today patient appears to be doing well with regard to his wound. In fact the slough seems to be loosening up quite significantly today and the Santyl has really been doing a good job. He has gotten approval for the wound VAC and that should be delivered hopefully tomorrow 01/23/2021 on evaluation today patient actually appears to be doing quite well in regard to his wound. I do believe the wound VAC has done extremely well for him he said that since Friday and already just over the weekend he has noticed a lot of improvement even the home health nurse was surprised by how much improvement. There does not appear to be any evidence of active infection at this time. No fevers, chills, nausea, vomiting, or diarrhea. 02/06/2021 on evaluation today patient's wound actually appears to be doing quite well. This is good news. He did have a lot of pain over the weekend in the past week in general with his wound VAC that was removed and just a wet-to-dry dressing applied. He tells me that without the wound VAC on there is no pain. He tells me that he is really been having pain all along with the wound VAC nonetheless I do feel like this has done a great job for him as far as getting the wound to fill-in we have seen overall about a 75% improvement during the time we have been utilizing this. 02/13/2021 on evaluation today patient's wound actually showed signs of fairly good improvement which is great news. There does not appear to be any signs of active infection at this time also good news. With that being said he has been using the [facility] and that is doing a great job. He is really felt like he would like to just discontinue wound VAC which I completely understand. 03/13/2021 on evaluation today patient actually  appears to be doing extremely well with regard to his wound. Overall he has been using the [facility] and he is down to just 2 very tiny areas of opening there is really no depth whatsoever and in general his main issue is he is frustrated because he was hoping it would be completely healed today. I think he is doing excellent however. Electronic Signature(s) Signed: 03/13/2021 10:42:13 AM By: Worthy Keeler PA-C Entered By: Worthy Keeler on 03/13/2021 10:42:12 -------------------------------------------------------------------------------- Physical Exam Details Patient Name: Date of Service: [name] 03/13/2021 10:00 A M Medical Record Number: [id number] Patient Account Number: [id number] Date of Birth/Sex: Treating RN: [birth year] (60 y.o. [name] Primary Care Provider: PA Haig Prophet, [place] Other Clinician: Referring Provider: Treating Provider/Extender: Mardella Layman, Manish Weeks in Treatment: 20 Constitutional Well-nourished and well-hydrated in no acute distress. Respiratory normal breathing without difficulty. Psychiatric this patient is able to make decisions and demonstrates good insight into disease process. Alert and Oriented x 3. pleasant and cooperative. Notes Upon inspection patient's wound bed showed signs of good granulation and epithelization at this point. There does not appear to be any evidence of infection which is great news. I think the Hydrofera Blue has done well however I think he may actually do well switch over to collagen at this point. I discussed that  with him today and were going to proceed as such. There was no need again for [place] to debride anything away and again I think that this is significantly smaller compared to previous. Electronic Signature(s) Signed: 03/13/2021 10:42:46 AM By: Worthy Keeler PA-C Entered By: Worthy Keeler on 03/13/2021  10:42:46 -------------------------------------------------------------------------------- Physician Orders Details Patient Name: Date of Service: [name] 03/13/2021 10:00 A M Medical Record Number: [id number] Patient Account Number: [id number] Date of Birth/Sex: Treating RN: [birth year] (60 y.o. [name] Primary Care Provider: PA TIENT, [place] Other Clinician: Referring Provider: Treating Provider/Extender: Mardella Layman, Manish Weeks in Treatment: 15 Verbal / Phone Orders: No Diagnosis Coding ICD-10 Coding Code Description [phone] Acquired absence of left leg above knee T81.31XA Disruption of external operation (surgical) wound, not elsewhere classified, initial encounter I25.10 Atherosclerotic heart disease of native coronary artery without angina pectoris I73.89 Other specified peripheral vascular diseases I10 Essential (primary) hypertension Follow-up Appointments Return Appointment in 2 weeks. Bathing/ Shower/ Hygiene May shower and wash wound with soap and water. Home Health New wound care orders this week; continue Home Health for wound care. May utilize formulary equivalent dressing for wound treatment orders unless otherwise specified. - change to silver collagen Other Home Health Orders/Instructions: - Interim Wound Treatment Wound #3 - Amputation Site - Above Knee Wound Laterality: Left, Medial Cleanser: Wound Cleanser 3 x Per Week/30 Days Discharge Instructions: Cleanse the wound with wound cleanser prior to applying a clean dressing using gauze sponges, not tissue or cotton balls. Prim Dressing: Promogran Prisma Matrix, 4.34 (sq in) (silver collagen) (Home Health) 3 x Per Week/30 Days ary Discharge Instructions: Moisten collagen with saline or hydrogel Secondary Dressing: ComfortFoam Border, 6x6 in (silicone border) ([place]) 3 x Per Week/30 Days Discharge Instructions: Apply over primary dressing in clinic Electronic  Signature(s) Signed: 03/13/2021 6:22:43 PM By: Worthy Keeler PA-C Signed: 03/14/2021 5:44:19 PM By: Baruch Gouty RN, [name] Entered By: Baruch Gouty on 03/13/2021 10:36:14 -------------------------------------------------------------------------------- Problem List Details Patient Name: Date of Service: [name] 03/13/2021 10:00 A M Medical Record Number: [id number] Patient Account Number: [id number] Date of Birth/Sex: Treating RN: 1961-04-25 (60 y.o. [name] Primary Care Provider: PA Haig Prophet, [place] Other Clinician: Referring Provider: Treating Provider/Extender: Mardella Layman, Manish Weeks in Treatment: 15 Active Problems ICD-10 Encounter Code Description Active Date MDM Diagnosis [phone] Acquired absence of left leg above knee 01/16/2021 No Yes T81.31XA Disruption of external operation (surgical) wound, not elsewhere classified, 12/19/2020 No Yes initial encounter I25.10 Atherosclerotic heart disease of native coronary artery without angina pectoris 11/28/2020 No Yes I73.89 Other specified peripheral vascular diseases 11/28/2020 No Yes I10 Essential (primary) hypertension 11/28/2020 No Yes Inactive Problems Resolved Problems ICD-10 Code Description Active Date Resolved Date L89.323 Pressure ulcer of left buttock, stage 3 11/28/2020 11/28/2020 Electronic Signature(s) Signed: 03/13/2021 10:10:26 AM By: Worthy Keeler PA-C Entered By: Worthy Keeler on 03/13/2021 10:10:26 -------------------------------------------------------------------------------- Progress Note Details Patient Name: Date of Service: [name] 03/13/2021 10:00 A M Medical Record Number: [id number] Patient Account Number: [id number] Date of Birth/Sex: Treating RN: 1961-09-25 (60 y.o. [name] Primary Care Provider: PA Haig Prophet, [place] Other Clinician: Referring Provider: Treating Provider/Extender: Mardella Layman, Manish Weeks in Treatment:  15 Subjective Chief Complaint Information obtained from Patient Left gluteal pressure ulcer History of Present Illness (HPI) 11/28/2020 patient presents for initial evaluation here in our clinic concerning issues that he has been having with wounds actually in  the left gluteal region. These occurred following his surgery and he actually had surgery which included a left above-knee amputation. Unfortunately this was necessitated by the fact that the patient had a fairly complicated recent vascular and cardiac history including a STEMI on 08/17/2020. This was complicated by cardiac arrest secondary to chronic ST elevations in the inferior leads, severe bilateral renal artery stenosis, peripheral arterial disease with critical limb ischemia requiring right iliac revascularization of the left lower extremity, chronic congestive heart failure systolic with ischemic cardiomyopathy with complaints of shortness of breath, decreased ability to walk due to severe leg pain and again the patient had acute critical limb ischemia. He was too high of a risk being that he was on aspirin and Brilinta due to the recent STEMI and therefore was not a candidate for revascularization. Because he was decompensating so quickly it was elected to do an above-knee amputation to treat the progressing gangrene prior to this worsening significantly. This was done up by Dr. Donnetta Hutching on November 06, 2020. He did have a Foley catheter during the time that he was in the hospital. He was there from October 30, 2020 through November 15, 2020. It was during this time that he developed the wounds in the gluteal region on the left. The wounds appear to be in somewhat of a linear pattern that makes me in the suspect that he may have developed these as a result of having sat on a tubing such as a catheter tubing or something of the sort for too long causing a pressure injury although I cannot know that for sure. Not this far out. Currently the  patient is on aspirin and Plavix and is currently in skilled nursing at [facility]. 12/19/2020 upon evaluation today patient appears to be doing well with regard to his gluteal wounds are completely healed. That is great news. The main issue is actually with his above-knee amputation stump on the left. He unfortunately is having trouble here with getting this to heal part of the medial aspect is eschar covered and I think this is good have to clear away in order to see this improve. I do believe however that we can initiate Santyl to try to help loosen this up in the future we should be able to debride this away much more effectively and quickly. 01/09/2021 upon evaluation today patient appears to be doing well with regard to his wound currently. He actually appears to be doing quite well and I do believe the [facility] has been beneficial currently. There is much looser necrotic tissue noted in the wound bed we are going to need to clean this out I do think a wound VAC may likely be ideal for him in the situation 01/16/2021 upon evaluation today patient appears to be doing well with regard to his wound. In fact the slough seems to be loosening up quite significantly today and the Santyl has really been doing a good job. He has gotten approval for the wound VAC and that should be delivered hopefully tomorrow 01/23/2021 on evaluation today patient actually appears to be doing quite well in regard to his wound. I do believe the wound VAC has done extremely well for him he said that since Friday and already just over the weekend he has noticed a lot of improvement even the home health nurse was surprised by how much improvement. There does not appear to be any evidence of active infection at this time. No fevers, chills, nausea, vomiting, or diarrhea. 02/06/2021  on evaluation today patient's wound actually appears to be doing quite well. This is good news. He did have a lot of pain over the weekend in the  past week in general with his wound VAC that was removed and just a wet-to-dry dressing applied. He tells me that without the wound VAC on there is no pain. He tells me that he is really been having pain all along with the wound VAC nonetheless I do feel like this has done a great job for him as far as getting the wound to fill-in we have seen overall about a 75% improvement during the time we have been utilizing this. 02/13/2021 on evaluation today patient's wound actually showed signs of fairly good improvement which is great news. There does not appear to be any signs of active infection at this time also good news. With that being said he has been using the [facility] and that is doing a great job. He is really felt like he would like to just discontinue wound VAC which I completely understand. 03/13/2021 on evaluation today patient actually appears to be doing extremely well with regard to his wound. Overall he has been using the [facility] and he is down to just 2 very tiny areas of opening there is really no depth whatsoever and in general his main issue is he is frustrated because he was hoping it would be completely healed today. I think he is doing excellent however. Objective Constitutional Well-nourished and well-hydrated in no acute distress. Vitals Time Taken: 10:04 AM, Height: 72 in, Weight: 135 lbs, BMI: 18.3, Temperature: 98.6 F, Pulse: 70 bpm, Respiratory Rate: 17 breaths/min, Blood Pressure: 120/71 mmHg. Respiratory normal breathing without difficulty. Psychiatric this patient is able to make decisions and demonstrates good insight into disease process. Alert and Oriented x 3. pleasant and cooperative. General Notes: Upon inspection patient's wound bed showed signs of good granulation and epithelization at this point. There does not appear to be any evidence of infection which is great news. I think the Hydrofera Blue has done well however I think he may actually do  well switch over to collagen at this point. I discussed that with him today and were going to proceed as such. There was no need again for [place] to debride anything away and again I think that this is significantly smaller compared to previous. Integumentary (Hair, Skin) Wound #3 status is Open. Original cause of wound was Surgical Injury. The date acquired was: 11/06/2020. The wound has been in treatment 12 weeks. The wound is located on the Left,Medial Amputation Site - Above Knee. The wound measures 0.3cm length x 0.4cm width x 0.1cm depth; 0.094cm^2 area and 0.009cm^3 volume. There is Fat Layer (Subcutaneous Tissue) exposed. There is no tunneling or undermining noted. There is a small amount of serosanguineous drainage noted. The wound margin is flat and intact. There is medium (34-66%) red granulation within the wound bed. There is a medium (34- 66%) amount of necrotic tissue within the wound bed including Adherent Slough. Assessment Active Problems ICD-10 Acquired absence of left leg above knee Disruption of external operation (surgical) wound, not elsewhere classified, initial encounter Atherosclerotic heart disease of native coronary artery without angina pectoris Other specified peripheral vascular diseases Essential (primary) hypertension Plan Follow-up Appointments: Return Appointment in 2 weeks. Bathing/ Shower/ Hygiene: May shower and wash wound with soap and water. Home Health: New wound care orders this week; continue Home Health for wound care. May utilize formulary equivalent dressing for wound  treatment orders unless otherwise specified. - change to silver collagen Other Home Health Orders/Instructions: - Interim WOUND #3: - Amputation Site - Above Knee Wound Laterality: Left, Medial Cleanser: Wound Cleanser 3 x Per Week/30 Days Discharge Instructions: Cleanse the wound with wound cleanser prior to applying a clean dressing using gauze sponges, not tissue or cotton  balls. Prim Dressing: Promogran Prisma Matrix, 4.34 (sq in) (silver collagen) (Home Health) 3 x Per Week/30 Days ary Discharge Instructions: Moisten collagen with saline or hydrogel Secondary Dressing: ComfortFoam Border, 6x6 in (silicone border) ([place]) 3 x Per Week/30 Days Discharge Instructions: Apply over primary dressing in clinic 1. I would recommend that we go ahead and continue with the wound care measures as before with regard to making sure to keep pressure and keeping things under control in that regard off of the area. He is doing a good and extremely good job with this and is having the home health nurse change this regularly. We will switch him over to the silver collagen dressing which I think will do better than the [facility] based on where he is at in the stage of healing. 2. Also did advise that he may should go ahead and call the prosthetics clinic in order to go ahead and make an appointment since I think he is close to healing he may not be 100% there but nonetheless I think there is something they can get started with him at least. He is in agreement with that plan. We will see patient back for reevaluation in 1 week here in the clinic. If anything worsens or changes patient will contact our office for additional recommendations. Electronic Signature(s) Signed: 03/13/2021 10:43:56 AM By: Worthy Keeler PA-C Entered By: Worthy Keeler on 03/13/2021 10:43:55 -------------------------------------------------------------------------------- SuperBill Details Patient Name: Date of Service: [name], Ashok Croon S. 03/13/2021 Medical Record Number: [id number] Patient Account Number: [id number] Date of Birth/Sex: Treating RN: 11-10-1961 (60 y.o. [name] Primary Care Provider: PA Haig Prophet, [place] Other Clinician: Referring Provider: Treating Provider/Extender: Mardella Layman, Manish Weeks in Treatment: 15 Diagnosis Coding ICD-10 Codes Code  Description [phone] Acquired absence of left leg above knee T81.31XA Disruption of external operation (surgical) wound, not elsewhere classified, initial encounter I25.10 Atherosclerotic heart disease of native coronary artery without angina pectoris I73.89 Other specified peripheral vascular diseases I10 Essential (primary) hypertension Facility Procedures CPT4 Code: [id number] Description: 99213 - WOUND CARE VISIT-LEV 3 EST PT Modifier: Quantity: 1 Physician Procedures : CPT4 Code Description Modifier [id number] - WC PHYS LEVEL 3 - EST PT ICD-10 Diagnosis Description Z89.612 Acquired absence of left leg above knee T81.31XA Disruption of external operation (surgical) wound, not elsewhere classified, initial encounter  I25.10 Atherosclerotic heart disease of native coronary artery without angina pectoris I73.89 Other specified peripheral vascular diseases Quantity: 1 Electronic Signature(s) Signed: 03/13/2021 10:44:22 AM By: Worthy Keeler PA-C Entered By: Worthy Keeler on 03/13/2021 10:44:22

## 2021-03-14 NOTE — Progress Notes (Signed)
Mike [name], Mike [name] ([id number]) Visit Report for 03/13/2021 Arrival Information Details Patient Name: Date of Service: Mike [name]. 03/13/2021 10:00 A M Medical Record Number: [id number] Patient Account Number: [id number] Date of Birth/Sex: Treating RN: February 14, 1961 (60 y.o. [name] Primary Care Zo Loudon: PA TIENT, NO Other Clinician: Referring Ellenor Wisniewski: Treating Elizabeth Haff/Extender: Mardella Layman, Manish Weeks in Treatment: 15 Visit Information History Since Last Visit Added or deleted any medications: No Patient Arrived: Wheel Chair Any new allergies or adverse reactions: No Arrival Time: 10:03 Had a fall or experienced change in No Accompanied By: sister activities of daily living that may affect Transfer Assistance: None risk of falls: Patient Identification Verified: Yes Signs or symptoms of abuse/neglect since last visito No Secondary Verification Process Completed: Yes Hospitalized since last visit: No Patient Requires Transmission-Based Precautions: No Implantable device outside of the clinic excluding No Patient Has Alerts: Yes cellular tissue based products placed in the center Patient Alerts: Patient on Blood Thinner since last visit: Has Dressing in Place as Prescribed: Yes Pain Present Now: No Electronic Signature(s) Signed: 03/14/2021 12:58:14 PM By: Sandre Kitty Entered By: Sandre Kitty on 03/13/2021 10:04:07 -------------------------------------------------------------------------------- [place] Details Patient Name: Date of Service: Mike Dike HN S. 03/13/2021 10:00 A M Medical Record Number: [id number] Patient Account Number: [id number] Date of Birth/Sex: Treating RN: 1961/06/29 (60 y.o. [name] Primary Care Cyrah Mclamb: PA Haig Prophet, NO Other Clinician: Referring [place] Doig: Treating Colum Colt/Extender: Mardella Layman, Manish Weeks in Treatment: [place] reviewed  with physician Active Inactive Wound/Skin Impairment Nursing Diagnoses: Impaired tissue integrity Knowledge deficit related to ulceration/compromised skin integrity Goals: Patient/caregiver will verbalize understanding of skin care regimen Date Initiated: 11/28/2020 Target Resolution Date: 04/10/2021 Goal Status: Active Ulcer/skin breakdown will have a volume reduction of 30% by week 4 Date Initiated: 11/28/2020 Date Inactivated: 12/19/2020 Target Resolution Date: 12/26/2020 Goal Status: Met Interventions: Assess patient/caregiver ability to obtain necessary supplies Assess patient/caregiver ability to perform ulcer/skin care regimen upon admission and as needed Assess ulceration(s) every visit Provide education on ulcer and skin care Treatment Activities: Skin care regimen initiated : 11/28/2020 Topical wound management initiated : 11/28/2020 Notes: Electronic Signature(s) Signed: 03/14/2021 5:44:19 PM By: Baruch Gouty RN, [name] Entered By: Baruch Gouty on 03/13/2021 10:26:59 -------------------------------------------------------------------------------- Pain Assessment Details Patient Name: Date of Service: Mike Dike HN S. 03/13/2021 10:00 A M Medical Record Number: [id number] Patient Account Number: [id number] Date of Birth/Sex: Treating RN: 11-04-1961 (60 y.o. [name] Primary Care Brienna Bass: PA Haig Prophet, [place] Other Clinician: Referring Faron Whitelock: Treating Mea Ozga/Extender: Mardella Layman, Manish Weeks in Treatment: 15 Active Problems Location of Pain Severity and Description of Pain Patient Has Paino No Site Locations Pain Management and Medication Current Pain Management: Electronic Signature(s) Signed: 03/14/2021 12:58:14 PM By: Sandre Kitty Signed: 03/14/2021 5:44:19 PM By: Baruch Gouty RN, [name] Entered By: Sandre Kitty on 03/13/2021  10:04:30 -------------------------------------------------------------------------------- Patient/Caregiver Education Details Patient Name: Date of Service: Mike [name] 3/16/2022andnbsp10:00 A M Medical Record Number: [id number] Patient Account Number: [id number] Date of Birth/Gender: Treating RN: [birth year] (60 y.o. [name] Primary Care Physician: PA Haig Prophet, NO Other Clinician: Referring Physician: Treating Physician/Extender: Mardella Layman, Manish Weeks in Treatment: 15 Education Assessment Education Provided To: Patient Education Topics Provided Wound/Skin Impairment: Methods: Explain/Verbal Responses: Reinforcements needed, State content correctly [employer]) Signed: 03/14/2021 5:44:19 PM By: Baruch Gouty RN, [name] Entered By: Baruch Gouty on 03/13/2021 10:27:29 -------------------------------------------------------------------------------- Wound Assessment Details Patient Name: Date of Service:  [place], JO HN S. 03/13/2021 10:00 A M Medical Record Number: [id number] Patient Account Number: [id number] Date of Birth/Sex: Treating RN: 05-30-1961 (60 y.o. [name] Primary Care Taj Arteaga: PA Haig Prophet, NO Other Clinician: Referring Terralyn Matsumura: Treating Adaiah Morken/Extender: Mardella Layman, Manish Weeks in Treatment: 15 Wound Status Wound Number: 3 Primary Dehisced Wound Etiology: Wound Location: Left, Medial Amputation Site - Above Knee Wound Open Wounding Event: Surgical Injury Status: Date Acquired: 11/06/2020 Comorbid Sleep Apnea, Coronary Artery Disease, Hypotension, Myocardial Weeks Of Treatment: 12 History: Infarction, Peripheral Arterial Disease Clustered Wound: No Photos Wound Measurements Length: (cm) 0.3 Width: (cm) 0.4 Depth: (cm) 0.1 Area: (cm) 0.094 Volume: (cm) 0.009 % Reduction in Area: 99.2% % Reduction in Volume: 99.9% Epithelialization: Medium (34-66%) Tunneling:  No Undermining: No Wound Description Classification: Full Thickness Without Exposed Support Structures Wound Margin: Flat and Intact Exudate Amount: Small Exudate Type: Serosanguineous Exudate Color: red, brown Foul Odor After Cleansing: No Slough/Fibrino Yes Wound Bed Granulation Amount: Medium (34-66%) Exposed Structure Granulation Quality: Red Fascia Exposed: No Necrotic Amount: Medium (34-66%) Fat Layer (Subcutaneous Tissue) Exposed: Yes Necrotic Quality: Adherent Slough Tendon Exposed: No Muscle Exposed: No Joint Exposed: No Bone Exposed: No Electronic Signature(s) Signed: 03/14/2021 12:58:14 PM By: Sandre Kitty Signed: 03/14/2021 5:44:19 PM By: Baruch Gouty RN, [name] Entered By: Sandre Kitty on 03/13/2021 17:06:36 -------------------------------------------------------------------------------- [place] Details Patient Name: Date of Service: Mike Dike HN S. 03/13/2021 10:00 A M Medical Record Number: [id number] Patient Account Number: [id number] Date of Birth/Sex: Treating RN: Feb 26, 1961 (60 y.o. [name] Primary Care Sosha Shepherd: PA TIENT, NO Other Clinician: Referring Enrica Corliss: Treating Niemah Schwebke/Extender: Mardella Layman, Manish Weeks in Treatment: 15 Vital Signs Time Taken: 10:04 Temperature (F): 98.6 Height (in): 72 Pulse (bpm): 70 Weight (lbs): 135 Respiratory Rate (breaths/min): 17 Body Mass Index (BMI): 18.3 Blood Pressure (mmHg): 120/71 Reference Range: 80 - 120 mg / dl Electronic Signature(s) Signed: 03/14/2021 12:58:14 PM By: Sandre Kitty Entered By: Sandre Kitty on 03/13/2021 10:04:24

## 2021-03-15 DIAGNOSIS — I340 Nonrheumatic mitral (valve) insufficiency: Secondary | ICD-10-CM | POA: Diagnosis not present

## 2021-03-15 DIAGNOSIS — I739 Peripheral vascular disease, unspecified: Secondary | ICD-10-CM | POA: Diagnosis not present

## 2021-03-15 DIAGNOSIS — R2990 Unspecified symptoms and signs involving the nervous system: Secondary | ICD-10-CM | POA: Diagnosis not present

## 2021-03-15 DIAGNOSIS — I6309 Cerebral infarction due to thrombosis of other precerebral artery: Secondary | ICD-10-CM | POA: Diagnosis not present

## 2021-03-15 DIAGNOSIS — M6281 Muscle weakness (generalized): Secondary | ICD-10-CM | POA: Diagnosis not present

## 2021-03-15 DIAGNOSIS — R2689 Other abnormalities of gait and mobility: Secondary | ICD-10-CM | POA: Diagnosis not present

## 2021-03-15 DIAGNOSIS — T8131XD Disruption of external operation (surgical) wound, not elsewhere classified, subsequent encounter: Secondary | ICD-10-CM | POA: Diagnosis not present

## 2021-03-15 DIAGNOSIS — E785 Hyperlipidemia, unspecified: Secondary | ICD-10-CM | POA: Diagnosis not present

## 2021-03-15 DIAGNOSIS — I5020 Unspecified systolic (congestive) heart failure: Secondary | ICD-10-CM | POA: Diagnosis not present

## 2021-03-15 DIAGNOSIS — N179 Acute kidney failure, unspecified: Secondary | ICD-10-CM | POA: Diagnosis not present

## 2021-03-15 DIAGNOSIS — Z720 Tobacco use: Secondary | ICD-10-CM | POA: Diagnosis not present

## 2021-03-15 DIAGNOSIS — Z4801 Encounter for change or removal of surgical wound dressing: Secondary | ICD-10-CM | POA: Diagnosis not present

## 2021-03-15 DIAGNOSIS — Z89612 Acquired absence of left leg above knee: Secondary | ICD-10-CM | POA: Diagnosis not present

## 2021-03-15 DIAGNOSIS — I10 Essential (primary) hypertension: Secondary | ICD-10-CM | POA: Diagnosis not present

## 2021-03-15 DIAGNOSIS — I213 ST elevation (STEMI) myocardial infarction of unspecified site: Secondary | ICD-10-CM | POA: Diagnosis not present

## 2021-03-18 ENCOUNTER — Encounter: Payer: Self-pay | Admitting: *Deleted

## 2021-03-18 ENCOUNTER — Other Ambulatory Visit (HOSPITAL_COMMUNITY)
Admission: RE | Admit: 2021-03-18 | Discharge: 2021-03-18 | Disposition: A | Discharge status: Home / Self Care | Payer: BC Managed Care – PPO | Source: Ambulatory Visit | Attending: Interventional Radiology | Admitting: Interventional Radiology

## 2021-03-18 DIAGNOSIS — E785 Hyperlipidemia, unspecified: Secondary | ICD-10-CM | POA: Diagnosis not present

## 2021-03-18 DIAGNOSIS — Z01812 Encounter for preprocedural laboratory examination: Secondary | ICD-10-CM | POA: Diagnosis not present

## 2021-03-18 DIAGNOSIS — R2990 Unspecified symptoms and signs involving the nervous system: Secondary | ICD-10-CM | POA: Diagnosis not present

## 2021-03-18 DIAGNOSIS — N179 Acute kidney failure, unspecified: Secondary | ICD-10-CM | POA: Diagnosis not present

## 2021-03-18 DIAGNOSIS — Z89612 Acquired absence of left leg above knee: Secondary | ICD-10-CM | POA: Diagnosis not present

## 2021-03-18 DIAGNOSIS — I739 Peripheral vascular disease, unspecified: Secondary | ICD-10-CM | POA: Diagnosis not present

## 2021-03-18 DIAGNOSIS — Z4801 Encounter for change or removal of surgical wound dressing: Secondary | ICD-10-CM | POA: Diagnosis not present

## 2021-03-18 DIAGNOSIS — T8131XD Disruption of external operation (surgical) wound, not elsewhere classified, subsequent encounter: Secondary | ICD-10-CM | POA: Diagnosis not present

## 2021-03-18 DIAGNOSIS — Z20822 Contact with and (suspected) exposure to covid-19: Secondary | ICD-10-CM | POA: Insufficient documentation

## 2021-03-18 DIAGNOSIS — I213 ST elevation (STEMI) myocardial infarction of unspecified site: Secondary | ICD-10-CM | POA: Diagnosis not present

## 2021-03-18 DIAGNOSIS — R2689 Other abnormalities of gait and mobility: Secondary | ICD-10-CM | POA: Diagnosis not present

## 2021-03-18 DIAGNOSIS — I340 Nonrheumatic mitral (valve) insufficiency: Secondary | ICD-10-CM | POA: Diagnosis not present

## 2021-03-18 DIAGNOSIS — Z720 Tobacco use: Secondary | ICD-10-CM | POA: Diagnosis not present

## 2021-03-18 DIAGNOSIS — M6281 Muscle weakness (generalized): Secondary | ICD-10-CM | POA: Diagnosis not present

## 2021-03-18 DIAGNOSIS — I10 Essential (primary) hypertension: Secondary | ICD-10-CM | POA: Diagnosis not present

## 2021-03-18 DIAGNOSIS — I5020 Unspecified systolic (congestive) heart failure: Secondary | ICD-10-CM | POA: Diagnosis not present

## 2021-03-18 DIAGNOSIS — I6309 Cerebral infarction due to thrombosis of other precerebral artery: Secondary | ICD-10-CM | POA: Diagnosis not present

## 2021-03-18 LAB — SARS CORONAVIRUS 2 (TAT 6-24 HRS): SARS Coronavirus 2: NEGATIVE

## 2021-03-18 NOTE — Progress Notes (Signed)
[facility] - 03/18/21 0900      Assessment    Assessment type Phone to patient    Is patient still in hospital? No    Date of hospital discharge after thrombolysis? 12/14/20      Final 90-Day Modified Rankin Score   Final 90-Day Modified Rankin Score: ([employer]) 0-No symptoms from stroke remain, but able to carry out all usual activities      [id number]   Mobility 2- slight problems in walking about    Self-care 2- slight problems washing or dressing oneself    Usual activities 2- slight problems with performing usual activities    Pain/discomfort 1- no pain or discomfort    Anxiety/Depression 1- not anxious or depressed    What number between 0-100 best describes the patient's health state today (100 means the best health; 0 means the worst health)? [facility] Admission   In the Past 3 months (since your initial hospitalisation for stroke), have you been admitted to hospital (including day-only procedures) for any reason? No      Doctor consultations   In the past 3 months (since your initial hospitalisation for stroke), have you seen any doctors or other health professional (for example physiotherapy, outpatient nurse, general practitioner) for any reason? Yes      a. Doctor consultations   a. Type of service Wound Care    a. Condition or purpose Ulcers    a. Date of appointment 12/19/20      b. Doctor consultations   b. Type of service Cardiology    b. Condition or purpose HFrEF    b. Date of appointment 12/20/20      c. Doctor consultations   c. Type of service Wound care    c. Condition or purpose ulcers    c. Date of appointment 01/09/21      d. Doctor consultation   d. Type of service Podiatry    d. Condition or purpose PAD    d. Date of appointment 01/24/20      e. Doctor consultation   e. Type of service Wound Care    e. Condition or purpose Ulcers    e. Date of appointment 01/23/21      f. Doctor consultation   f. Type of service Cardiology    f.  Condition or purpose HFrEF    f. Date of appointment 01/25/20

## 2021-03-18 NOTE — Progress Notes (Signed)
[facility] - 03/18/21 0900      Assessment    Assessment type Phone to patient    Is patient still in hospital? No    Date of hospital discharge after thrombolysis? 12/14/20      Final 90-Day Modified Rankin Score   Final 90-Day Modified Rankin Score: ([employer]) 0-No symptoms from stroke remain, but able to carry out all usual activities      [id number]-[id number]   Mobility 2- slight problems in walking about    Self-care 2- slight problems washing or dressing oneself    Usual activities 2- slight problems with performing usual activities    Pain/discomfort 1- no pain or discomfort    Anxiety/Depression 1- not anxious or depressed    What number between 0-100 best describes the patient's health state today (100 means the best health; 0 means the worst health)? [facility] Admission   In the Past 3 months (since your initial hospitalisation for stroke), have you been admitted to hospital (including day-only procedures) for any reason? No      Doctor consultations   In the past 3 months (since your initial hospitalisation for stroke), have you seen any doctors or other health professional (for example physiotherapy, outpatient nurse, general practitioner) for any reason? Yes      a. Doctor consultations   a. Type of service Wound Care    a. Condition or purpose Ulcers    a. Date of appointment 12/19/20      b. Doctor consultations   b. Type of service Cardiology    b. Condition or purpose HFrEF    b. Date of appointment 12/20/20      c. Doctor consultations   c. Type of service Wound care    c. Condition or purpose ulcers    c. Date of appointment 01/09/21      d. Doctor consultation   d. Type of service Podiatry    d. Condition or purpose PAD    d. Date of appointment 01/24/20      e. Doctor consultation   e. Type of service Wound Care    e. Condition or purpose Ulcers    e. Date of appointment 01/23/21      f. Doctor consultation   f. Type of service Cardiology    f.  Condition or purpose HFrEF    f. Date of appointment 01/25/20

## 2021-03-19 ENCOUNTER — Other Ambulatory Visit: Payer: Self-pay | Admitting: Radiology

## 2021-03-19 ENCOUNTER — Other Ambulatory Visit: Payer: Self-pay

## 2021-03-19 ENCOUNTER — Encounter: Payer: Self-pay | Admitting: Neurology

## 2021-03-19 ENCOUNTER — Encounter (HOSPITAL_COMMUNITY): Payer: Self-pay | Admitting: Interventional Radiology

## 2021-03-19 ENCOUNTER — Ambulatory Visit (INDEPENDENT_AMBULATORY_CARE_PROVIDER_SITE_OTHER): Payer: BC Managed Care – PPO | Admitting: Neurology

## 2021-03-19 VITALS — BP 107/69 | HR 84 | Ht 72.0 in | Wt 148.0 lb

## 2021-03-19 DIAGNOSIS — I6300 Cerebral infarction due to thrombosis of unspecified precerebral artery: Secondary | ICD-10-CM

## 2021-03-19 DIAGNOSIS — I639 Cerebral infarction, unspecified: Secondary | ICD-10-CM | POA: Diagnosis not present

## 2021-03-19 NOTE — Progress Notes (Signed)
Mike [name] denies chest pain or shortness of breath. Patient was tested for Covid and has been in quarantine since that time.  Mr. Cicero was told by Dr. Ozella Almond  Team to hold [place] after pm dose 03/18/21, continue ASA and Plavix until am of procedure.   [name] had a wound that home health is changing, MWF, patient will miss this change on Wednesday, his question: is there anyone who can change the dressing, [name] will bring all of the supplies, which is minimal. I told [name] that I would leave a note on front of his chart asking someone from Dr. Young Berry team to write an order to have it changed.

## 2021-03-19 NOTE — Progress Notes (Addendum)
Anesthesia Chart Review:  Case: [id number] Date/Time: 03/20/21 0845   Procedure: PULMONARY EMBOLIZATION (N/A )   Anesthesia type: General   Pre-op diagnosis: PULMONARY AVM   Location: [facility] RAD[facility] / [facility]   Surgeons: Aletta Edouard, MD      DISCUSSION: Patient is a 60 year old male scheduled for the above procedure.  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 Seen at the wound clinic for left gluteal pressure ulcer (last visit 03/13/21 by Jeri Cos, PA-C).  He was last evaluated by neurologist Dr. Leonie Man on 03/19/21 for follow-up recent cryptogenic CVA with PFO and pulmonary AVF. He plans to check a transcranial Doppler bubble study 1 month after his pulmonary AVF closure to re-evaluate PFO size and determine if elective closure would be warranted in the future. Given ROPE score of 4, he felt low probability that PFO caused his CVA. 3 month follow-up planned.  Last televisit with cardiologist Dr. Virgina Jock was on 02/20/21.  Mike [name] was doing well from a cardiac standpoint. No angina. Euvolemic. He was awaiting further communication with IR regarding timing of AV fistula closure. TCD or repeat echo planned after pulmonary AVF closure to evaluate for any residual shunting. History of sinus pause at night. EF 35-40%, no indication for ICD at that time. He is on Plavix, ASA, Xarelto (COMPASS trial) given at visit. Dr. Kathlene Cote is having him hold Xarelto for one day prior to procedure (last dose 03/18/20 PM) and continue  ASA/Plavix.   03/18/2021 preprocedure COVID-19 test negative.  He is a same-day work-up, so anesthesia team evaluation and labs on the day of surgery.   VS:  BP Readings from Last 3 Encounters:  03/19/21 107/69  02/19/21 138/73  01/24/21 135/72   Pulse Readings from Last 3 Encounters:  03/19/21 84  01/24/21 69  12/20/20 81    PROVIDERS: Ephriam Jenkins, PA is listed as PCP Vernell Leep, MD is cardiologist Antony Contras, MD is neurologist   LABS: For day of procedure. Currently, last lab results include: Lab Results  Component Value Date   WBC 7.5 12/14/2020   HGB 11.6 (L) 12/14/2020   HCT 35.5 (L) 12/14/2020   PLT 202 12/14/2020   GLUCOSE 118 (H) 01/31/2021   ALT 10 12/11/2020   AST 17 12/11/2020   NA 145 (H) 01/31/2021   K 3.8 01/31/2021   CL 108 (H) 01/31/2021   CREATININE 1.00 01/31/2021   BUN 22 01/31/2021   CO2 20 01/31/2021   TSH 4.755 (H) 11/10/2020   INR 1.0 12/11/2020   HGBA1C 4.7 (L) 12/12/2020     IMAGES: CTA Chest 12/14/2020: IMPRESSION: 1. Large 3.1 x 1.9 cm pulmonary arteriovenous fistula in the medial left lower lobe. 2. Right middle lobe 3 mm solid pulmonary nodule. Follow-up noncontrast chest CT recommended in 12 months in this high risk patient.This recommendation follows the consensus statement: Guidelines for Management of Incidental Pulmonary Nodules Detected on CT Images: From the Fleischner Society 2017; Radiology 2017; 284:228-243. 3. Mild cardiomegaly. 4. Aortic Atherosclerosis (ICD10-I70.0) and Emphysema (ICD10-J43.9).  MRI  Brain 12/12/2020: IMPRESSION: - Acute right MCA territory cortical infarcts within the right frontoparietal operculum and more posteriorly and superiorly within the right parietal lobe. - Acute right PCA territory cortical infarcts within the right parietooccipital lobes. Additional 12 mm acute infarct within the right callosal splenium. - Late subacute or chronic cortical infarct within the  paramedian left frontal lobe (ACA vascular territory). - Late subacute lacunar infarct within the mid right frontal lobe white matter. - Background moderate cerebral white matter chronic small vessel ischemic disease. Small chronic left thalamic lacunar infarct. - Mild cerebral atrophy.  CTA head/neck 12/11/2020: IMPRESSION: - Atherosclerosis without large vessel occlusion. - No hemodynamically significant stenosis at the ICA origins. Likely chronically small caliber extracranial left vertebral artery with diminished opacification. - Atherosclerotic irregularity of distal ACA and MCA branches including suspected high-grade stenosis of distal right M2 MCA branch. - Intracranial left vertebral artery atherosclerosis with focal marked stenosis proximally. Moderate stenosis of distal left P2 PCA.  1V PCXR 11/11/2020: IMPRESSION: Mild streaky airspace opacities in the lung bases may represent atelectasis or atypical/viral pneumonia.   EKG: EKG 12/20/2020: Sinus rhythm 82 bpm Old inferior infarct Nonspecific ST depression    CV: Echocardiogram/TTE 01/21/2021:  Left ventricle cavity is normal in size. Mild concentric hypertrophy of  the left ventricle. Left ventricle regional wall motion findings: Basal  inferolateral and Basal inferior akinesis, mild global hypokinesis.   Moderately depressed LV systolic function with visual EF 35-40%. Doppler  evidence of grade I (impaired) diastolic dysfunction, normal LAP.  Left atrial cavity is moderately dilated.  Structurally normal mitral valve. No evidence of mitral stenosis. Moderate  to severe secondary mitral regurgitation due basal inferior akinesis.  Mild tricuspid regurgitation.  No evidence of pulmonary hypertension.  Compared to previous study on 09/11/2020, LVEF appears improved from  15-20%. Mitral regurgitation is increased in severity from mild to  moderate.  (Comparison TTE 08/02/20: EF 30-35%, moderate global and severe  inferolateral hypokinesis, grade II DD, moderate MR; 09/11/20: mild-moderate MR EF 15-20%, moderate global and severe inferolateral hypokinesis, mild-moderate MR; 12/11/20: EF 30-35%, severe hypokinesis of the entire inferior and inferolateral walls, remaining LV segments moderately hypokinetic, grade II DD, mild MR; 12/14/20: EF 30-35% mild global hypokinesis, severe inferolateral hypokinesis, no thrombus seen normal RV systolic function)   Mobile cardiac telemetry 14 days 12/20/2020 - 01/03/2021:  Dominant rhythm: Sinus.  HR 31-167 bpm. Avg HR 76 bpm.  14 episodes of SVT/atrial tachycardia, fastest at 167 bpm, longest for 18.7 sec  <1%% isolated SVE, couplet/triplets.  6 episodes of VT/idioventricular rhythm, fastest at 156 bpm, longest for 10 beats  8% isolated VE, 40 sec bigeminy, 13m 17 sec trigeminy.  2 episodes of sinus pauses, 4-4.6 sec noted during sleep hours.  No atrial fibrillation/atrial flutter//high grade AV block  0 patient triggered events.    TEE 12/14/2020: IMPRESSIONS  1. Left ventricular ejection fraction, by estimation, is 30 to 35%. The  left ventricle has moderately decreased function. Mild global and moderate  inferolateral hypokinesis.   Questionable echolucency next to posterior papillary muscle. See  separate Definity contrast images performed same day for details.  2. Right ventricular systolic function is normal. The right ventricular  size is normal.  3. Left atrial size was mildly dilated. No left atrial/left atrial  appendage thrombus was detected.  4. The mitral valve is grossly normal. Moderate mitral valve  regurgitation.  5. The aortic valve is tricuspid. Aortic valve regurgitation is not  visualized.  6. There is mild (Grade  II) plaque involving the ascending aorta.  7. Evidence of atrial level shunting detected by color flow Doppler.  Small tunneled PFO, as well as small secundum ASD. Strongly positive  bubble study out of proportion to  the above interatrial shunts. Consider  additional extra-cardiac shunting, such as  pulmonary AV malformation/fistula.    Carotid US 10/17/2020: Summary:  - Right Carotid: Velocities in the right ICA are consistent with a 1-39% stenosis.  - Left Carotid: Velocities in the left ICA are consistent with a 1-39% stenosis.  - Vertebrals: Bilateral vertebral arteries demonstrate antegrade flow. Left >50% cervical stenosis.  - Subclavians: Normal flow hemodynamics were seen in bilateral subclavian arteries.   Abdominal Aortic Duplex 09/21/2020:  Moderate plaque noted in the proximal, mid and distal aorta. There is an  ulcerated plaque noted in the distal abdominal aorta. No AAA. Normal iliac  artery velocity.   Cardiac cath/PCI 08/03/2020: LM: Distal 40% stenosis LAD: Normal LCx: Subtotally occluded OM2, prox LCx 70% stenosis        Successful percutaneous coronary intervention OM2-Prox LCx        PTCA and overlapping stents placement         2.5 X 38 mm and 2.5 X 18 mm Resolute Onyx drug-eluting stents        100%--->0% stenosis. TIMI flow 0-->III        Small caliber distal vessel with moderate diffuse disease RCA: Prox 30% stenosis.  Patent mid RCA sttent 2.5 X 26 mm Resolute Onyx drug-eluting stent LVEDP 42 mmHg   Past Medical History:  Diagnosis Date  . Anxiety   . CAD (coronary artery disease)   . Heart attack ([place]) 08/01/2020  . Heart failure with reduced ejection fraction ([place])   . Hyperlipidemia   . Hypertension   . PAD (peripheral artery disease) ([place])    right CIA & right distal iliac artery stents 10/16/20; left AKA 11/06/20  . PFO (patent foramen ovale) 12/14/2020  . Pneumonia   . Pulmonary AV (arteriovenous) fistula ([place]) 12/14/2020  . Stroke ([place]) 12/11/2020   no residuial effects    Past Surgical History:  Procedure Laterality Date  . ABDOMINAL AORTOGRAM W/LOWER EXTREMITY N/A 10/16/2020   Procedure: ABDOMINAL AORTOGRAM W/LOWER EXTREMITY;  Surgeon:  Nigel Mormon, MD;  Location: [facility];  Service: Cardiovascular;  Laterality: N/A;  . AMPUTATION Left 11/06/2020   Procedure: AMPUTATION ABOVE KNEE;  Surgeon: Rosetta Posner, MD;  Location: [facility];  Service: Vascular;  Laterality: Left;  . BUBBLE STUDY  12/14/2020   Procedure: BUBBLE STUDY;  Surgeon: Nigel Mormon, MD;  Location: [facility];  Service: Cardiovascular;;  . CORONARY STENT INTERVENTION N/A 08/03/2020   Procedure: CORONARY STENT INTERVENTION;  Surgeon: Nigel Mormon, MD;  Location: [facility];  Service: Cardiovascular;  Laterality: N/A;  . CORONARY/GRAFT ACUTE MI REVASCULARIZATION N/A 08/01/2020   Procedure: Coronary/Graft Acute MI Revascularization;  Surgeon: Nigel Mormon, MD;  Location: [facility];  Service: Cardiovascular;  Laterality: N/A;  . INTRAVASCULAR LITHOTRIPSY Right 10/16/2020   Procedure: INTRAVASCULAR LITHOTRIPSY;  Surgeon: Nigel Mormon, MD;  Location: [facility];  Service: Cardiovascular;  Laterality: Right;  Common and External Iliac  . IR RADIOLOGIST EVAL & MGMT  01/30/2021  . LEFT HEART CATH AND CORONARY ANGIOGRAPHY N/A 08/01/2020   Procedure: LEFT HEART CATH AND CORONARY ANGIOGRAPHY;  Surgeon: Nigel Mormon, MD;  Location: [facility];  Service: Cardiovascular;  Laterality: N/A;  . LEFT HEART  CATH AND CORONARY ANGIOGRAPHY N/A 08/03/2020   Procedure: LEFT HEART CATH AND CORONARY ANGIOGRAPHY;  Surgeon: Nigel Mormon, MD;  Location: [facility];  Service: Cardiovascular;  Laterality: N/A;  . PERIPHERAL VASCULAR INTERVENTION Right 10/16/2020   Procedure: PERIPHERAL VASCULAR INTERVENTION;  Surgeon: Nigel Mormon, MD;  Location: [facility];  Service: Cardiovascular;  Laterality: Right;  Common and Iliac Stent  . TEE WITHOUT CARDIOVERSION N/A 12/14/2020   Procedure: TRANSESOPHAGEAL ECHOCARDIOGRAM (TEE);  Surgeon: Nigel Mormon, MD;  Location[facility];   Service: Cardiovascular;  Laterality: N/A;    MEDICATIONS: No current facility-administered medications for this encounter.   Marland Kitchen acetaminophen (TYLENOL) 500 MG tablet  . aspirin EC 81 MG tablet  . clopidogrel (PLAVIX) 75 MG tablet  . gabapentin (NEURONTIN) 300 MG capsule  . metoprolol succinate (TOPROL-XL) 25 MG 24 hr tablet  . nitroGLYCERIN (NITROSTAT) 0.4 MG SL tablet  . rivaroxaban (XARELTO) 2.5 MG TABS tablet  . rosuvastatin (CRESTOR) 10 MG tablet  . sacubitril-valsartan (ENTRESTO) 49-51 MG  . furosemide (LASIX) 20 MG tablet    Myra Gianotti, PA-C Surgical Short Stay/Anesthesiology [facility] Phone [phone] [facility] Phone [phone] 03/19/2021 4:34 PM

## 2021-03-19 NOTE — Anesthesia Preprocedure Evaluation (Addendum)
Anesthesia Evaluation  Patient identified by MRN, date of birth, ID band Patient awake    Reviewed: Allergy & Precautions, NPO status , Patient's Chart, lab work & pertinent test results  Airway Mallampati: I       Dental no notable dental hx.    Pulmonary Current Smoker and Patient abstained from smoking.,    Pulmonary exam normal        Cardiovascular hypertension, + CAD, + Past MI and + Cardiac Stents  Normal cardiovascular exam     Neuro/Psych    GI/Hepatic negative GI ROS, Neg liver ROS,   Endo/Other    Renal/GU      Musculoskeletal negative musculoskeletal ROS (+)   Abdominal Normal abdominal exam  (+)   Peds  Hematology negative hematology ROS (+)   Anesthesia Other Findings   Reproductive/Obstetrics                            Anesthesia Physical Anesthesia Plan  ASA: III  Anesthesia Plan: General   Post-op Pain Management:    Induction: Intravenous  PONV Risk Score and Plan: 2 and Ondansetron, Midazolam and Dexamethasone  Airway Management Planned: Oral ETT  Additional Equipment: None  Intra-op Plan:   Post-operative Plan: Extubation in OR  Informed Consent: I have reviewed the patients History and Physical, chart, labs and discussed the procedure including the risks, benefits and alternatives for the proposed anesthesia with the patient or authorized representative who has indicated his/her understanding and acceptance.     Dental advisory given  Plan Discussed with: CRNA  Anesthesia Plan Comments: (PAT note written 03/19/2021 by Myra Gianotti, PA-C. )       Anesthesia Quick Evaluation

## 2021-03-19 NOTE — Patient Instructions (Signed)
I had a long d/w patient about his recent cryptogenic strokes, patent foramen ovale and pulmonary AV fistula, risk for recurrent stroke/TIAs, personally independently reviewed imaging studies and stroke evaluation results and answered questions.Continue aspirin 81 mg daily, clopidogrel 75 mg daily and Xarelto 2.5 mg twice daily for his cardiac disease for secondary stroke prevention and maintain strict control of hypertension with blood pressure goal below 130/90, diabetes with hemoglobin A1c goal below 6.5% and lipids with LDL cholesterol goal below 70 mg/dL. I also advised the patient to eat a healthy diet with plenty of whole grains, cereals, fruits and vegetables, exercise regularly and maintain ideal body weight .recommend outpatient loop recorder for paroxysmal A. fib.  Patient counseled to quit smoking completely.  Check transcranial Doppler bubble study 1 month after his pulmonary AV fistula closure tomorrow to gauge idea about the size of his PFO to decide about elective PFO closure.  Followup in the future with me in 3 months or call earlier if necessary.  Stroke Prevention Some medical conditions and behaviors are associated with a higher chance of having a stroke. You can help prevent a stroke by making nutrition, lifestyle, and other changes, including managing any medical conditions you may have. What nutrition changes can be made?  Eat healthy foods. You can do this by: ? Choosing foods high in fiber, such as fresh fruits and vegetables and whole grains. ? Eating at least 5 or more servings of fruits and vegetables a day. Try to fill half of your plate at each meal with fruits and vegetables. ? Choosing lean protein foods, such as lean cuts of meat, poultry without skin, fish, tofu, beans, and nuts. ? Eating low-fat dairy products. ? Avoiding foods that are high in salt (sodium). This can help lower blood pressure. ? Avoiding foods that have saturated fat, trans fat, and cholesterol. This  can help prevent high cholesterol. ? Avoiding processed and premade foods.  Follow your health care provider's specific guidelines for losing weight, controlling high blood pressure (hypertension), lowering high cholesterol, and managing diabetes. These may include: ? Reducing your daily calorie intake. ? Limiting your daily sodium intake to 1,500 milligrams (mg). ? Using only healthy fats for cooking, such as olive oil, canola oil, or sunflower oil. ? Counting your daily carbohydrate intake.   What lifestyle changes can be made?  Maintain a healthy weight. Talk to your health care provider about your ideal weight.  Get at least 30 minutes of moderate physical activity at least 5 days a week. Moderate activity includes brisk walking, biking, and swimming.  Do not use any products that contain nicotine or tobacco, such as cigarettes and e-cigarettes. If you need help quitting, ask your health care provider. It may also be helpful to avoid exposure to secondhand smoke.  Limit alcohol intake to no more than 1 drink a day for nonpregnant women and 2 drinks a day for men. One drink equals 12 oz of beer, 5 oz of wine, or 1 oz of hard liquor.  Stop any illegal drug use.  Avoid taking birth control pills. Talk to your health care provider about the risks of taking birth control pills if: ? You are over 39 years old. ? You smoke. ? You get migraines. ? You have ever had a blood clot. What other changes can be made?  Manage your cholesterol levels. ? Eating a healthy diet is important for preventing high cholesterol. If cholesterol cannot be managed through diet alone, you may also need to  take medicines. ? Take any prescribed medicines to control your cholesterol as told by your health care provider.  Manage your diabetes. ? Eating a healthy diet and exercising regularly are important parts of managing your blood sugar. If your blood sugar cannot be managed through diet and exercise, you may  need to take medicines. ? Take any prescribed medicines to control your diabetes as told by your health care provider.  Control your hypertension. ? To reduce your risk of stroke, try to keep your blood pressure below 130/80. ? Eating a healthy diet and exercising regularly are an important part of controlling your blood pressure. If your blood pressure cannot be managed through diet and exercise, you may need to take medicines. ? Take any prescribed medicines to control hypertension as told by your health care provider. ? Ask your health care provider if you should monitor your blood pressure at home. ? Have your blood pressure checked every year, even if your blood pressure is normal. Blood pressure increases with age and some medical conditions.  Get evaluated for sleep disorders (sleep apnea). Talk to your health care provider about getting a sleep evaluation if you snore a lot or have excessive sleepiness.  Take over-the-counter and prescription medicines only as told by your health care provider. Aspirin or blood thinners (antiplatelets or anticoagulants) may be recommended to reduce your risk of forming blood clots that can lead to stroke.  Make sure that any other medical conditions you have, such as atrial fibrillation or atherosclerosis, are managed. What are the warning signs of a stroke? The warning signs of a stroke can be easily remembered as BEFAST.  B is for balance. Signs include: ? Dizziness. ? Loss of balance or coordination. ? Sudden trouble walking.  E is for eyes. Signs include: ? A sudden change in vision. ? Trouble seeing.  F is for face. Signs include: ? Sudden weakness or numbness of the face. ? The face or eyelid drooping to one side.  A is for arms. Signs include: ? Sudden weakness or numbness of the arm, usually on one side of the body.  S is for speech. Signs include: ? Trouble speaking (aphasia). ? Trouble understanding.  T is for time. ? These  symptoms may represent a serious problem that is an emergency. Do not wait to see if the symptoms will go away. Get medical help right away. Call your local emergency services (911 in the [place] Do not drive yourself to the hospital.  Other signs of stroke may include: ? A sudden, severe headache with no known cause. ? Nausea or vomiting. ? Seizure. Where to find more information For more information, visit:  American Stroke Association: www.strokeassociation.org  National Stroke Association: www.stroke.org Summary  You can prevent a stroke by eating healthy, exercising, not smoking, limiting alcohol intake, and managing any medical conditions you may have.  Do not use any products that contain nicotine or tobacco, such as cigarettes and e-cigarettes. If you need help quitting, ask your health care provider. It may also be helpful to avoid exposure to secondhand smoke.  Remember BEFAST for warning signs of stroke. Get help right away if you or a loved one has any of these signs. This information is not intended to replace advice given to you by your health care provider. Make sure you discuss any questions you have with your health care provider. Document Revised: 11/27/2017 Document Reviewed: 01/20/2017 Elsevier Patient Education  2021 [employer].

## 2021-03-19 NOTE — Progress Notes (Addendum)
[facility] [address] [place]. [place] [address] [phone]       OFFICE CONSULT NOTE  Mike [name] Date of Birth:  [birth year] Medical Record Number:  [id number]   Referring MD: Rosalin Hawking Reason for Referral: Stroke HPI: Mike [name] is a 60 year old Caucasian male seen today for initial office consultation visit for stroke.  History is obtained from the patient, review of electronic medical records and I personally reviewed pertinent available imaging films in PACS. 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  Hypertension, hyperlipidemia, coronary artery disease s/p STEMI with stenting and congestive heart failure and chronic tobacco abuse.  He presented as a code stroke on 12/11/2020 brought in by EMS from an inpatient rehab facility [place] where he was working with therapist who noticed sudden onset of left facial droop and slurred speech.  He had NIH stroke scale 5 on arrival CT angiogram of the head and neck showed no large vessel occlusion.  He was given IV TPA.  He started improving post TPA administration.  He admitted to intensive care unit with blood pressure tightly controlled and remained stable.  MRI scan of the brain showed acute right MCA territory cortical infarcts involving the right frontoparietal operculum and posterior superior right parietal lobe.  There are also acute right PCA territory cortical infarcts involving parieto-occipital lobes and splenium of corpus callosum.  2D echo showed ejection fraction of 30 to 35% which is actually improved from the previous 15 to 20%.  TEE showed right to left intra-atrial shunt and also surgery suggested pulmonary AV fistula which was subsequently confirmed on CT angiogram of the chest involving inferior medial left lobe of the lung.  LDL cholesterol 74 mg percent and hemoglobin A1c was 4.7.  Urine drug  screen was negative.  Patient was previously on aspirin 81 and Plavix 75 mg daily for coronary artery disease as well as Xarelto 2.5 mg twice daily which were continued.  Patient states his neurological deficits have completely resolved and is back to baseline.  Is tolerating his antiplatelets medications and Xarelto with easy bruising but no bleeding episode.  He is scheduled to undergo elective pulmonary AV fistula endovascular closure by Dr. Kathlene Cote tomorrow.  He had 2-week external cardiac monitor done which did not show evidence of atrial fibrillation.  He has cut back smoking but still smokes a few cigarettes a day and knows he has to quit.  His wound from his above-knee amputation in the left leg has still not fully healed hence he does not have a prosthetic leg and hence is not able to walk with a walker and will need a wheelchair for constant use.   ROS:   14 system review of systems is positive for left leg amputation, speech difficulty, aphasia, bruising all other systems negative PMH:  Past Medical History:  Diagnosis Date   CAD (coronary artery disease)    Heart attack ([place])    Hyperlipidemia    Hypertension    PAD (peripheral artery disease) ([facility])     Social History:  Social History   Socioeconomic History   Marital status: Single    Spouse name: Not on file   Number of children: 1   Years of education: BA   Highest education level: Not on file  Occupational History   Occupation: Disability  Tobacco Use   Smoking status: Current Some Day Smoker    Years: 40.00  Types: Cigarettes   Smokeless tobacco: Never Used   Tobacco comment: handful per day  Vaping Use   Vaping Use: Never used  Substance and Sexual Activity   Alcohol use: Yes    Alcohol/week: 10.0 standard drinks    Types: 10 Cans of beer per week    Comment: very moderate   Drug use: Never   Sexual activity: Not on file  Other Topics Concern   Not on file  Social History Narrative   Left handed    Caffeine use: 3-4 sodas per day, tea very rare.    Social Determinants of Health   Financial Resource Strain: Not on file  Food Insecurity: Not on file  Transportation Needs: Not on file  Physical Activity: Not on file  Stress: Not on file  Social Connections: Not on file  Intimate Partner Violence: Not on file    Medications:   Current Outpatient Medications on File Prior to Visit  Medication Sig Dispense Refill   acetaminophen (TYLENOL) 500 MG tablet Take 500-1,000 mg by mouth every 6 (six) hours as needed (pain).     aspirin EC 81 MG tablet Take 1 tablet (81 mg total) by mouth daily. Swallow whole. (Patient taking differently: Take 81 mg by mouth every evening. Swallow whole.) 90 tablet 0   clopidogrel (PLAVIX) 75 MG tablet TAKE 1 TABLET(75 MG) BY MOUTH DAILY (Patient taking differently: Take 75 mg by mouth every evening.) 30 tablet 6   furosemide (LASIX) 20 MG tablet TAKE 1 TABLET(20 MG) BY MOUTH DAILY 30 tablet 0   gabapentin (NEURONTIN) 300 MG capsule Take 1 capsule (300 mg total) by mouth 2 (two) times daily. (Patient taking differently: Take 300 mg by mouth in the morning and at bedtime.) 60 capsule 0   metoprolol succinate (TOPROL-XL) 25 MG 24 hr tablet Take 1 tablet (25 mg total) by mouth daily. Take with or immediately following a meal. (Patient taking differently: Take 25 mg by mouth every evening. Take with or immediately following a meal.) 90 tablet 3   rivaroxaban (XARELTO) 2.5 MG TABS tablet Take 1 tablet (2.5 mg total) by mouth 2 (two) times daily. 60 tablet 3   rosuvastatin (CRESTOR) 10 MG tablet TAKE 1 TABLET(10 MG) BY MOUTH DAILY (Patient taking differently: Take 10 mg by mouth every evening.) 30 tablet 0   sacubitril-valsartan (ENTRESTO) 49-51 MG Take 1 tablet by mouth 2 (two) times daily. 60 tablet 2   nitroGLYCERIN (NITROSTAT) 0.4 MG SL tablet Place 1 tablet (0.4 mg total) under the tongue every 5 (five) minutes as needed for chest pain. (Patient taking differently:  Place 0.4 mg under the tongue every 5 (five) minutes as needed for chest pain (max 3 doses).) 30 tablet 1   No current facility-administered medications on file prior to visit.    Allergies:   Allergies  Allergen Reactions   Chlorhexidine Other (See Comments)    Patient denies allergy    Physical Exam General: well developed, well nourished middle-aged Caucasian male, seated, in no evident distress Head: head normocephalic and atraumatic.   Neck: supple with no carotid or supraclavicular bruits Cardiovascular: regular rate and rhythm, no murmurs Musculoskeletal:   Left leg above-knee amputation Skin:  no rash/petichiae Vascular:  Normal pulses all extremities  Neurologic Exam Mental Status: Awake and fully alert. Oriented to place and time. Recent and remote memory intact. Attention span, concentration and fund of knowledge appropriate. Mood and affect appropriate.  Cranial Nerves: Fundoscopic exam reveals sharp disc margins. Pupils equal, briskly  reactive to light. Extraocular movements full without nystagmus. Visual fields full to confrontation. Hearing intact. Facial sensation intact. Face, tongue, palate moves normally and symmetrically.  Motor: Normal bulk and tone. Normal strength in all tested extremity muscles. Sensory.: intact to touch , pinprick , position and vibratory sensation.  Coordination: Rapid alternating movements normal in all extremities. Finger-to-nose and heel-to-shin performed accurately bilaterally. Gait and Station: Arises from chair without difficulty.  Uses a walker stance is normal. Gait demonstrates favoring right leg due to left leg amputation. Reflexes: 1+ and symmetric.  Left knee and ankle jerks cannot be tested.   Toes downgoing.   NIHSS  0 Modified Rankin  1   ASSESSMENT: 60 year old Caucasian male with by cerebral embolic infarcts in December 2021 of cryptogenic etiology with vascular risk factors of  CAD,congestive heart failure,  hyperlipidemia, smoking, peripheral vascular disease, PFO and pulmonary AV fistula     PLAN: I had a long d/w patient about his recent cryptogenic strokes, patent foramen ovale and pulmonary AV fistula, risk for recurrent stroke/TIAs, personally independently reviewed imaging studies and stroke evaluation results and answered questions.Continue aspirin 81 mg daily, clopidogrel 75 mg daily and Xarelto 2.5 mg twice daily for his cardiac disease for secondary stroke prevention and maintain strict control of hypertension with blood pressure goal below 130/90, diabetes with hemoglobin A1c goal below 6.5% and lipids with LDL cholesterol goal below 70 mg/dL. I also advised the patient to eat a healthy diet with plenty of whole grains, cereals, fruits and vegetables, exercise regularly and maintain ideal body weight .recommend outpatient loop recorder for paroxysmal A. fib.  Patient counseled to quit smoking completely.  Check transcranial Doppler bubble study 1 month after his pulmonary AV fistula closure tomorrow to gauge idea about the size of his PFO to decide about elective PFO closure.  He may also need outpatient prolonged cardiac loop monitor to look for paroxysmal A. fib and will discuss this with Dr. Virgina Jock. He has a ROPE score of 4 - so low probability that PFO is the cause of his stroke.  Patient will benefit visit using a wheelchair for ambulation since he does not have a prosthetic leg yet and has a wound which has not healed on his amputation site.  Followup in the future with me in 3 months or call earlier if necessary.  Greater than 50% time during this 45-minute visit was spent on counseling and coordination of care about his recent cryptogenic strokes discussion about PFO and pulmonary AV fistula and answering questions Antony Contras, MD  [facility] [address] [place], [place] [address]  Phone [phone] Fax [phone] Note: This document was  prepared with digital dictation and possible smart phrase technology. Any transcriptional errors that result from this process are unintentional.

## 2021-03-20 ENCOUNTER — Ambulatory Visit (HOSPITAL_COMMUNITY): Payer: BC Managed Care – PPO | Admitting: Vascular Surgery

## 2021-03-20 ENCOUNTER — Encounter (HOSPITAL_COMMUNITY)
Admission: RE | Disposition: A | Discharge status: Home / Self Care | Payer: Self-pay | Source: Home / Self Care | Attending: Interventional Radiology

## 2021-03-20 ENCOUNTER — Ambulatory Visit (HOSPITAL_COMMUNITY): Payer: BC Managed Care – PPO

## 2021-03-20 ENCOUNTER — Other Ambulatory Visit: Payer: Self-pay

## 2021-03-20 ENCOUNTER — Encounter (HOSPITAL_COMMUNITY): Payer: Self-pay | Admitting: Interventional Radiology

## 2021-03-20 ENCOUNTER — Observation Stay (HOSPITAL_COMMUNITY)
Admission: RE | Admit: 2021-03-20 | Discharge: 2021-03-21 | Disposition: A | Discharge status: Home / Self Care | Payer: BC Managed Care – PPO | Attending: Interventional Radiology | Admitting: Interventional Radiology

## 2021-03-20 ENCOUNTER — Ambulatory Visit (HOSPITAL_COMMUNITY)
Admission: RE | Admit: 2021-03-20 | Payer: BC Managed Care – PPO | Source: Ambulatory Visit | Admitting: Interventional Radiology

## 2021-03-20 DIAGNOSIS — I252 Old myocardial infarction: Secondary | ICD-10-CM | POA: Insufficient documentation

## 2021-03-20 DIAGNOSIS — I2510 Atherosclerotic heart disease of native coronary artery without angina pectoris: Secondary | ICD-10-CM | POA: Insufficient documentation

## 2021-03-20 DIAGNOSIS — Z7982 Long term (current) use of aspirin: Secondary | ICD-10-CM | POA: Diagnosis not present

## 2021-03-20 DIAGNOSIS — Z7901 Long term (current) use of anticoagulants: Secondary | ICD-10-CM | POA: Diagnosis not present

## 2021-03-20 DIAGNOSIS — Z8673 Personal history of transient ischemic attack (TIA), and cerebral infarction without residual deficits: Secondary | ICD-10-CM | POA: Insufficient documentation

## 2021-03-20 DIAGNOSIS — Q2730 Arteriovenous malformation, site unspecified: Secondary | ICD-10-CM | POA: Insufficient documentation

## 2021-03-20 DIAGNOSIS — Z01818 Encounter for other preprocedural examination: Secondary | ICD-10-CM | POA: Diagnosis not present

## 2021-03-20 DIAGNOSIS — I280 Arteriovenous fistula of pulmonary vessels: Principal | ICD-10-CM | POA: Insufficient documentation

## 2021-03-20 DIAGNOSIS — Z791 Long term (current) use of non-steroidal anti-inflammatories (NSAID): Secondary | ICD-10-CM | POA: Insufficient documentation

## 2021-03-20 DIAGNOSIS — R29810 Facial weakness: Secondary | ICD-10-CM | POA: Diagnosis not present

## 2021-03-20 DIAGNOSIS — I340 Nonrheumatic mitral (valve) insufficiency: Secondary | ICD-10-CM | POA: Diagnosis not present

## 2021-03-20 DIAGNOSIS — Z7902 Long term (current) use of antithrombotics/antiplatelets: Secondary | ICD-10-CM | POA: Diagnosis not present

## 2021-03-20 DIAGNOSIS — Z8679 Personal history of other diseases of the circulatory system: Secondary | ICD-10-CM | POA: Insufficient documentation

## 2021-03-20 DIAGNOSIS — Z79899 Other long term (current) drug therapy: Secondary | ICD-10-CM | POA: Diagnosis not present

## 2021-03-20 DIAGNOSIS — E782 Mixed hyperlipidemia: Secondary | ICD-10-CM | POA: Diagnosis not present

## 2021-03-20 DIAGNOSIS — Z89612 Acquired absence of left leg above knee: Secondary | ICD-10-CM | POA: Insufficient documentation

## 2021-03-20 DIAGNOSIS — Q2572 Congenital pulmonary arteriovenous malformation: Secondary | ICD-10-CM | POA: Diagnosis not present

## 2021-03-20 DIAGNOSIS — I739 Peripheral vascular disease, unspecified: Secondary | ICD-10-CM | POA: Insufficient documentation

## 2021-03-20 DIAGNOSIS — Q211 Atrial septal defect: Secondary | ICD-10-CM | POA: Diagnosis not present

## 2021-03-20 DIAGNOSIS — R4781 Slurred speech: Secondary | ICD-10-CM | POA: Diagnosis not present

## 2021-03-20 HISTORY — PX: IR ANGIOGRAM PULMONARY LEFT SELECTIVE: IMG662

## 2021-03-20 HISTORY — PX: RADIOLOGY WITH ANESTHESIA: SHX6223

## 2021-03-20 HISTORY — PX: IR ANGIOGRAM SELECTIVE EACH ADDITIONAL VESSEL: IMG667

## 2021-03-20 HISTORY — PX: IR EMBO ARTERIAL NOT HEMORR HEMANG INC GUIDE ROADMAPPING: IMG5448

## 2021-03-20 HISTORY — PX: IR US GUIDE VASC ACCESS RIGHT: IMG2390

## 2021-03-20 HISTORY — DX: Anxiety disorder, unspecified: F41.9

## 2021-03-20 HISTORY — DX: Pneumonia, unspecified organism: J18.9

## 2021-03-20 HISTORY — DX: Unspecified systolic (congestive) heart failure: I50.20

## 2021-03-20 LAB — BASIC METABOLIC PANEL
Anion gap: 7 (ref 5–15)
BUN: 19 mg/dL (ref 8–23)
CO2: 23 mmol/L (ref 22–32)
Calcium: 9 mg/dL (ref 8.9–10.3)
Chloride: 109 mmol/L (ref 98–111)
Creatinine, Ser: 1.05 mg/dL (ref 0.61–1.24)
GFR, Estimated: >60 mL/min (ref >60)
Glucose, Bld: 85 mg/dL (ref 70–99)
Potassium: 3.8 mmol/L (ref 3.5–5.1)
Sodium: 139 mmol/L (ref 135–145)

## 2021-03-20 LAB — CBC
HCT: 42.1 % (ref 39.0–52.0)
Hemoglobin: 13.5 g/dL (ref 13.0–17.0)
MCH: 26.9 pg (ref 26.0–34.0)
MCHC: 32.1 g/dL (ref 30.0–36.0)
MCV: 84 fL (ref 80.0–100.0)
Platelets: 212 10*3/uL (ref 150–400)
RBC: 5.01 MIL/uL (ref 4.22–5.81)
RDW: 17.3 % — ABNORMAL HIGH (ref 11.5–15.5)
WBC: 8.3 10*3/uL (ref 4.0–10.5)
nRBC: 0 % (ref 0.0–0.2)

## 2021-03-20 LAB — ABO/RH: ABO/RH(D): O POS

## 2021-03-20 LAB — TYPE AND SCREEN
ABO/RH(D): O POS
Antibody Screen: NEGATIVE

## 2021-03-20 LAB — PROTIME-INR
INR: 1 (ref 0.8–1.2)
Prothrombin Time: 13.2 seconds (ref 11.4–15.2)

## 2021-03-20 SURGERY — MRI WITH ANESTHESIA
Anesthesia: General

## 2021-03-20 MED ORDER — LACTATED RINGERS IV SOLN: INTRAVENOUS | Status: DC

## 2021-03-20 MED ORDER — CEFAZOLIN SODIUM-DEXTROSE 2-4 GM/100ML-% IV SOLN
2.0000 g | INTRAVENOUS | Status: AC
  Administered 2021-03-20: 2 g via INTRAVENOUS
  Filled 2021-03-20 (×2): qty 100

## 2021-03-20 MED ORDER — ACETAMINOPHEN 10 MG/ML IV SOLN: 1000.0000 mg | Freq: Once | INTRAVENOUS | Status: DC | PRN

## 2021-03-20 MED ORDER — EPHEDRINE SULFATE-NACL 50-0.9 MG/10ML-% IV SOSY
PREFILLED_SYRINGE | INTRAVENOUS | Status: DC | PRN
  Administered 2021-03-20: 10 mg via INTRAVENOUS

## 2021-03-20 MED ORDER — DOCUSATE SODIUM 100 MG PO CAPS
100.0000 mg | ORAL_CAPSULE | Freq: Two times a day (BID) | ORAL | Status: DC
  Administered 2021-03-20 – 2021-03-21 (×2): 100 mg via ORAL
  Filled 2021-03-20 (×2): qty 1

## 2021-03-20 MED ORDER — FUROSEMIDE 20 MG PO TABS
20.0000 mg | ORAL_TABLET | Freq: Every day | ORAL | Status: DC
  Administered 2021-03-21: 20 mg via ORAL
  Filled 2021-03-20: qty 1

## 2021-03-20 MED ORDER — HEPARIN SODIUM (PORCINE) 1000 UNIT/ML IJ SOLN
INTRAMUSCULAR | Status: DC | PRN
  Administered 2021-03-20: 3000 [IU] via INTRAVENOUS

## 2021-03-20 MED ORDER — SODIUM CHLORIDE 0.9 % IV SOLN: INTRAVENOUS | Status: DC

## 2021-03-20 MED ORDER — ONDANSETRON HCL 4 MG/2ML IJ SOLN
INTRAMUSCULAR | Status: DC | PRN
  Administered 2021-03-20 (×2): 4 mg via INTRAVENOUS

## 2021-03-20 MED ORDER — IOHEXOL 300 MG/ML  SOLN
150.0000 mL | Freq: Once | INTRAMUSCULAR | Status: AC | PRN
  Administered 2021-03-20: 40 mL via INTRA_ARTERIAL

## 2021-03-20 MED ORDER — LIDOCAINE 2% (20 MG/ML) 5 ML SYRINGE
INTRAMUSCULAR | Status: DC | PRN
  Administered 2021-03-20: 100 mg via INTRAVENOUS

## 2021-03-20 MED ORDER — HYDROCODONE-ACETAMINOPHEN 5-325 MG PO TABS: 1.0000 | ORAL_TABLET | ORAL | Status: DC | PRN

## 2021-03-20 MED ORDER — METOPROLOL SUCCINATE ER 25 MG PO TB24: 25.0000 mg | ORAL_TABLET | Freq: Once | ORAL | Status: DC

## 2021-03-20 MED ORDER — MEPERIDINE HCL 25 MG/ML IJ SOLN: 6.2500 mg | INTRAMUSCULAR | Status: DC | PRN

## 2021-03-20 MED ORDER — ONDANSETRON HCL 4 MG/2ML IJ SOLN: 4.0000 mg | Freq: Four times a day (QID) | INTRAMUSCULAR | Status: DC | PRN

## 2021-03-20 MED ORDER — DEXAMETHASONE SODIUM PHOSPHATE 10 MG/ML IJ SOLN
INTRAMUSCULAR | Status: DC | PRN
  Administered 2021-03-20: 10 mg via INTRAVENOUS

## 2021-03-20 MED ORDER — CHLORHEXIDINE GLUCONATE 0.12 % MT SOLN: 15.0000 mL | Freq: Once | OROMUCOSAL | Status: AC

## 2021-03-20 MED ORDER — NITROGLYCERIN 0.4 MG SL SUBL: 0.4000 mg | SUBLINGUAL_TABLET | SUBLINGUAL | Status: DC | PRN

## 2021-03-20 MED ORDER — GABAPENTIN 300 MG PO CAPS
300.0000 mg | ORAL_CAPSULE | Freq: Two times a day (BID) | ORAL | Status: DC
  Administered 2021-03-20 – 2021-03-21 (×2): 300 mg via ORAL
  Filled 2021-03-20 (×2): qty 1

## 2021-03-20 MED ORDER — CLOPIDOGREL BISULFATE 75 MG PO TABS
75.0000 mg | ORAL_TABLET | Freq: Every evening | ORAL | Status: DC
  Administered 2021-03-20: 75 mg via ORAL
  Filled 2021-03-20: qty 1

## 2021-03-20 MED ORDER — ASPIRIN EC 81 MG PO TBEC
81.0000 mg | DELAYED_RELEASE_TABLET | Freq: Every day | ORAL | Status: DC
  Administered 2021-03-21: 81 mg via ORAL
  Filled 2021-03-20: qty 1

## 2021-03-20 MED ORDER — PHENYLEPHRINE HCL-NACL 10-0.9 MG/250ML-% IV SOLN
INTRAVENOUS | Status: DC | PRN
  Administered 2021-03-20: 40 ug/min via INTRAVENOUS

## 2021-03-20 MED ORDER — FENTANYL CITRATE (PF) 100 MCG/2ML IJ SOLN
INTRAMUSCULAR | Status: DC | PRN
  Administered 2021-03-20: 100 ug via INTRAVENOUS

## 2021-03-20 MED ORDER — METOPROLOL SUCCINATE ER 25 MG PO TB24
25.0000 mg | ORAL_TABLET | Freq: Every evening | ORAL | Status: DC
  Administered 2021-03-20: 25 mg via ORAL
  Filled 2021-03-20: qty 1

## 2021-03-20 MED ORDER — ACETAMINOPHEN 325 MG PO TABS
325.0000 mg | ORAL_TABLET | ORAL | Status: DC | PRN
Start: 2021-03-20 — End: 2021-03-20

## 2021-03-20 MED ORDER — ACETAMINOPHEN 160 MG/5ML PO SOLN: 325.0000 mg | ORAL | Status: DC | PRN

## 2021-03-20 MED ORDER — SACUBITRIL-VALSARTAN 49-51 MG PO TABS
1.0000 | ORAL_TABLET | Freq: Two times a day (BID) | ORAL | Status: DC
  Administered 2021-03-20: 1 via ORAL
  Filled 2021-03-20 (×3): qty 1

## 2021-03-20 MED ORDER — SUGAMMADEX SODIUM 200 MG/2ML IV SOLN
INTRAVENOUS | Status: DC | PRN
  Administered 2021-03-20: 200 mg via INTRAVENOUS

## 2021-03-20 MED ORDER — ORAL CARE MOUTH RINSE: 15.0000 mL | Freq: Once | OROMUCOSAL | Status: AC

## 2021-03-20 MED ORDER — GLYCOPYRROLATE 0.2 MG/ML IJ SOLN
INTRAMUSCULAR | Status: DC | PRN
  Administered 2021-03-20: .2 mg via INTRAVENOUS

## 2021-03-20 MED ORDER — CHLORHEXIDINE GLUCONATE 0.12 % MT SOLN
OROMUCOSAL | Status: AC
  Administered 2021-03-20: 15 mL via OROMUCOSAL
  Filled 2021-03-20: qty 15

## 2021-03-20 MED ORDER — FENTANYL CITRATE (PF) 100 MCG/2ML IJ SOLN
25.0000 ug | INTRAMUSCULAR | Status: DC | PRN
Start: 2021-03-20 — End: 2021-03-20

## 2021-03-20 MED ORDER — ROSUVASTATIN CALCIUM 5 MG PO TABS
10.0000 mg | ORAL_TABLET | Freq: Every evening | ORAL | Status: DC
  Administered 2021-03-20: 10 mg via ORAL
  Filled 2021-03-20: qty 2

## 2021-03-20 MED ORDER — PROPOFOL 10 MG/ML IV BOLUS
INTRAVENOUS | Status: DC | PRN
  Administered 2021-03-20: 150 mg via INTRAVENOUS

## 2021-03-20 MED ORDER — LACTATED RINGERS IV SOLN: INTRAVENOUS | Status: DC | PRN

## 2021-03-20 MED ORDER — ROCURONIUM BROMIDE 10 MG/ML (PF) SYRINGE
PREFILLED_SYRINGE | INTRAVENOUS | Status: DC | PRN
  Administered 2021-03-20: 70 mg via INTRAVENOUS
  Administered 2021-03-20: 15 mg via INTRAVENOUS

## 2021-03-20 MED ORDER — OXYCODONE HCL 5 MG PO TABS
5.0000 mg | ORAL_TABLET | Freq: Once | ORAL | Status: DC | PRN
Start: 2021-03-20 — End: 2021-03-20

## 2021-03-20 MED ORDER — ONDANSETRON HCL 4 MG/2ML IJ SOLN: 4.0000 mg | Freq: Once | INTRAMUSCULAR | Status: DC | PRN

## 2021-03-20 MED ORDER — MIDAZOLAM HCL 2 MG/2ML IJ SOLN
INTRAMUSCULAR | Status: DC | PRN
  Administered 2021-03-20: 2 mg via INTRAVENOUS

## 2021-03-20 MED ORDER — OXYCODONE HCL 5 MG/5ML PO SOLN: 5.0000 mg | Freq: Once | ORAL | Status: DC | PRN

## 2021-03-20 MED ORDER — SENNOSIDES-DOCUSATE SODIUM 8.6-50 MG PO TABS: 1.0000 | ORAL_TABLET | Freq: Every day | ORAL | Status: DC | PRN

## 2021-03-20 NOTE — Transfer of Care (Signed)
Immediate Anesthesia Transfer of Care Note  Patient: Mike [name]  Procedure(s) Performed: PULMONARY EMBOLIZATION (N/A )  Patient Location: PACU  Anesthesia Type:General  Level of Consciousness: awake, alert  and oriented  Airway & Oxygen Therapy: Patient Spontanous Breathing and Patient connected to nasal cannula oxygen  Post-op Assessment: Report given to RN and Post -op Vital signs reviewed and stable  Post vital signs: Reviewed and stable  Last Vitals:  Vitals Value Taken Time  BP 132/72 03/20/21 1226  Temp 36.4 C 03/20/21 1225  Pulse 77 03/20/21 1236  Resp 17 03/20/21 1236  SpO2 94 % 03/20/21 1236  Vitals shown include unvalidated device data.  Last Pain:  Vitals:   03/20/21 1225  TempSrc:   PainSc: 0-No pain         Complications: No complications documented.

## 2021-03-20 NOTE — Sedation Documentation (Signed)
Procedure started. Anesthesia case 

## 2021-03-20 NOTE — Anesthesia Procedure Notes (Signed)
Arterial Line Insertion Start/End3/23/2022 8:45 AM, 03/20/2021 8:50 AM Performed by: CRNA  Patient location: Pre-op. Preanesthetic checklist: patient identified, IV checked, site marked, risks and benefits discussed, surgical consent, monitors and equipment checked, pre-op evaluation, timeout performed and anesthesia consent Lidocaine 1% used for infiltration Right, radial was placed Catheter size: 20 G Hand hygiene performed  and maximum sterile barriers used   Attempts: 2 Procedure performed without using ultrasound guided technique. Following insertion, dressing applied and Biopatch. Post procedure assessment: normal  Patient tolerated the procedure well with no immediate complications.

## 2021-03-20 NOTE — Anesthesia Procedure Notes (Signed)
Procedure Name: Intubation Date/Time: 03/20/2021 10:07 AM Performed by: Mariea Clonts, CRNA Pre-anesthesia Checklist: Patient identified, Emergency Drugs available, Suction available and Patient being monitored Patient Re-evaluated:Patient Re-evaluated prior to induction Oxygen Delivery Method: Circle System Utilized Preoxygenation: Pre-oxygenation with 100% oxygen Induction Type: IV induction Ventilation: Mask ventilation without difficulty and Oral airway inserted - appropriate to patient size Laryngoscope Size: Sabra Heck and 2 Grade View: Grade III Tube type: Oral Tube size: 8.0 mm Number of attempts: 1 Airway Equipment and Method: Stylet and Oral airway Placement Confirmation: ETT inserted through vocal cords under direct vision,  positive ETCO2 and breath sounds checked- equal and bilateral Tube secured with: Tape Dental Injury: Teeth and Oropharynx as per pre-operative assessment

## 2021-03-20 NOTE — Sedation Documentation (Signed)
Pt transported to PACU bay 4 via stretcher accompanied by RN and CRNA. Handoff completed with PACU RN. Pulses present via doppler on right foot. Level 0 on right groin with no hematoma.

## 2021-03-20 NOTE — Anesthesia Postprocedure Evaluation (Signed)
Anesthesia Post Note  Patient: [name]  Procedure(s) Performed: PULMONARY EMBOLIZATION (N/A )     Patient location during evaluation: PACU Anesthesia Type: General Level of consciousness: awake Pain management: pain level controlled Vital Signs Assessment: post-procedure vital signs reviewed and stable Respiratory status: spontaneous breathing Cardiovascular status: stable Postop Assessment: no apparent nausea or vomiting Anesthetic complications: no   No complications documented.  Last Vitals:  Vitals:   03/20/21 1240 03/20/21 1255  BP: 108/60 109/62  Pulse: 67 64  Resp: 10 13  Temp:    SpO2: 94% 96%    Last Pain:  Vitals:   03/20/21 1255  TempSrc:   PainSc: 0-No pain                 Huston Foley

## 2021-03-20 NOTE — H&P (Signed)
Referring Physician(s): Mike [name],M  Supervising Physician: Mike [name]  Patient Status:  Mike [name] OP TBA  Chief Complaint:  Pulmonary AVM/AV fistula  Subjective: Pt familiar to IR service from recent tele consultation with Dr. Kathlene [name] on 01/30/21 to discuss treatment options for pulmonary AVM/AV fistula. He is a 59 y.o. male smoker with a recent multifocal stroke in December which presented when he had symptoms of slurred speech, facial droop and weakness while undergoing physical therapy in rehab after leg amputation.  MRI demonstrated multifocal infarcts with acute infarcts in the right MCA and PCA territories and late subacute infarcts in the left frontal lobe and right frontal white matter.  Echocardiography demonstrated reduced left ventricular ejection fraction of 30-35%.  TEE demonstrated atrial shunting with a small PFO and a small secundum ASD.  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  [name] was given IV tPA at the time of his acute stroke and made an excellent recovery.  He currently states that he has no neurologic deficits or persistent symptoms.  He has a history of coronary artery disease, prior PCI, prior MI and severe peripheral vascular disease.  He is status post prior right iliac arterial revascularization by Dr. Virgina [name] on 10/16/20 with stenting of the right common and external iliac arteries.  A left above-knee amputation was performed by Dr. Donnetta [name] on 11/06/2020 due to progressive gangrene of the left foot.    [name] still has a wound VAC on the left above-knee amputation site.  He is walking with a walker.  He denies significant dyspnea.  He has had no new neurologic symptoms or deficits.  Following discussion with Dr. Kathlene [name] he was deemed an appropriate candidate for image guided  pulmonary angiogram with catheter directed occlusion/embolization of the pulmonary AVM/AV fistula and presents today for the procedure. He denies fever,HA, CP, dyspnea, cough, abd/back pain,N/V or bleeding. He continues to have no neurologic deficits.   Allergies: Patient has no active allergies.  Medications: Prior to Admission medications   Medication Sig Start Date End Date Taking? Authorizing Provider  acetaminophen (TYLENOL) 500 MG tablet Take 500-1,000 mg by mouth every 6 (six) hours as needed (pain).   Yes [provider]  aspirin EC 81 MG tablet Take 1 tablet (81 mg total) by mouth daily. Swallow whole. Patient taking differently: Take 81 mg by mouth every evening. Swallow whole. 01/08/21  Yes Mike [name], Mike J, MD  clopidogrel (PLAVIX) 75 MG tablet TAKE 1 TABLET(75 MG) BY MOUTH DAILY Patient taking differently: Take 75 mg by mouth every evening. 02/07/21  Yes Mike [name], Mike J, MD  furosemide (LASIX) 20 MG tablet TAKE 1 TABLET(20 MG) BY MOUTH DAILY 03/11/21  Yes Mike [name], Mike J, MD  gabapentin (NEURONTIN) 300 MG capsule Take 1 capsule (300 mg total) by mouth 2 (two) times daily. Patient taking differently: Take 300 mg by mouth in the morning and at bedtime. 11/15/20  Yes Little Ishikawa, MD  metoprolol succinate (TOPROL-XL) 25 MG 24 hr tablet Take 1 tablet (25 mg total) by mouth daily. Take with or immediately following a meal. Patient taking differently: Take 25 mg by mouth every evening. Take with or immediately following a meal. 12/20/20 03/20/21 Yes Mike [name], Mike J, MD  nitroGLYCERIN (NITROSTAT) 0.4 MG SL tablet Place 1 tablet (0.4 mg total) under the tongue every 5 (five) minutes as needed  for chest pain. Patient taking differently: Place 0.4 mg under the tongue every 5 (five) minutes as needed for chest pain (max 3 doses). 08/14/20 11/12/20 Yes Mike [name], Reynold Bowen, MD  rivaroxaban (XARELTO) 2.5 MG TABS tablet Take 1 tablet (2.5 mg total) by mouth 2 (two)  times daily. 12/20/20  Yes Mike [name], Mike J, MD  rosuvastatin (CRESTOR) 10 MG tablet TAKE 1 TABLET(10 MG) BY MOUTH DAILY Patient taking differently: Take 10 mg by mouth every evening. 02/07/21  Yes Mike [name], Mike J, MD  sacubitril-valsartan (ENTRESTO) 49-51 MG Take 1 tablet by mouth 2 (two) times daily. 02/20/21  Yes Mike [name], Reynold Bowen, MD     Vital Signs: BP (!) 146/65   Pulse 71   Temp 98.2 F (36.8 C) (Oral)   Resp 17   Ht 6' (1.829 m)   Wt 148 lb (67.1 kg)   SpO2 100%   BMI 20.07 kg/m   Physical Exam awake/alert; chest - CTA bilat; heart- RRR; abd- soft,+BS,NT; no RLE edema; left AKA  Imaging: DG Chest 1 View  Result Date: 03/20/2021 CLINICAL DATA:  Pulmonary embolization.  Preop EXAM: CHEST  1 VIEW COMPARISON:  Chest CT 12/14/2020 FINDINGS: Normal heart size and mediastinal contours. Right coronary stenting. No acute infiltrate or edema. No effusion or pneumothorax. No acute osseous findings. IMPRESSION: No active disease. Electronically Signed   By: Monte Fantasia M.D.   On: 03/20/2021 07:12    Labs:  CBC: Recent Labs    11/15/20 0338 12/11/20 1020 12/11/20 1027 12/12/20 0130 12/14/20 0450  WBC 9.3 7.7  --  8.2 7.5  HGB 7.0* 12.0* 12.6* 11.0* 11.6*  HCT 22.2* 40.3 37.0* 36.3* 35.5*  PLT 269 221  --  214 202    COAGS: Recent Labs    10/30/20 1822 12/11/20 1020  INR 1.2 1.0  APTT 31 29    BMP: Recent Labs    09/25/20 0903 10/17/20 0825 10/22/20 1054 10/26/20 1004 10/30/20 1652 12/13/20 0129 12/14/20 0450 01/31/21 1304 03/20/21 0649  NA 140   < > CANCELED 135   < > 141 140 145* 139  K 4.5   < > CANCELED 3.9   < > 3.7 3.9 3.8 3.8  CL 108*   < > CANCELED 98   < > 110 110 108* 109  CO2 18*   < > CANCELED 11*   < > 21* 22 20 23   GLUCOSE 102*   < > 135* 210*   < > 96 100* 118* 85  BUN 15   < > 67* 79*   < > 11 13 22 19   CALCIUM 9.4   < > CANCELED 9.1   < > 8.9 8.6* 8.9 9.0  CREATININE 1.16   < > 6.34* 5.36*   < > 1.11 1.09 1.00 1.05   GFRNONAA 68   < > 9* 11*   < > >60 >60 81 >60  GFRAA 78  --  10* 12*  --   --   --  93  --    < > = values in this interval not displayed.    LIVER FUNCTION TESTS: Recent Labs    11/13/20 0409 11/14/20 0236 11/15/20 0338 12/11/20 1020  BILITOT 0.6 0.5 0.4 0.8  AST 50* 47* 37 17  ALT 75* 70* 61* 10  ALKPHOS 55 54 62 76  PROT 5.5* 5.0* 5.0* 6.5  ALBUMIN 1.9* 1.8* 1.8* 3.0*    Assessment and Plan: Pt familiar to IR service from recent tele consultation with  Dr. Kathlene [name] on 01/30/21 to discuss treatment options for pulmonary AVM/AV fistula. He is a 60 y.o. male smoker with a recent multifocal stroke in December which presented when he had symptoms of slurred speech, facial droop and weakness while undergoing physical therapy in rehab after leg amputation.  MRI demonstrated multifocal infarcts with acute infarcts in the right MCA and PCA territories and late subacute infarcts in the left frontal lobe and right frontal white matter.  Echocardiography demonstrated reduced left ventricular ejection fraction of 30-35%.  TEE demonstrated atrial shunting with a small PFO and a small secundum ASD.  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  Mr. Savant was given IV tPA at the time of his acute stroke and made an excellent recovery.  He currently states that he has no neurologic deficits or persistent symptoms.  He has a history of coronary artery disease, prior PCI, prior MI and severe peripheral vascular disease.  He is status post prior right iliac arterial revascularization by Dr. Virgina [name] on 10/16/20 with stenting of the right common and external iliac arteries.  A left above-knee amputation was performed by Dr. Donnetta [name] on 11/06/2020 due to progressive gangrene of the left foot.    Mr. Mori still has a wound VAC on the left  above-knee amputation site.  He is walking with a walker.  He denies significant dyspnea.  He has had no new neurologic symptoms or deficits.  Following discussion with Dr. Kathlene [name] he was deemed an appropriate candidate for image guided pulmonary angiogram with catheter directed occlusion/embolization of the pulmonary AVM/AV fistula and presents today for the procedure.Risks and benefits of procedure were discussed with the patient including, but not limited to bleeding, infection, vascular injury, contrast induced renal failure or anesthesia related complications. .  This interventional procedure involves the use of X-rays and because of the nature of the planned procedure, it is possible that we will have prolonged use of X-ray fluoroscopy.  Potential radiation risks to you include (but are not limited to) the following: - A slightly elevated risk for cancer  several years later in life. This risk is typically less than 0.5% percent. This risk is low in comparison to the normal incidence of human cancer, which is 33% for women and 50% for men according to the [place]. - Radiation induced injury can include skin redness, resembling a rash, tissue breakdown / ulcers and hair loss (which can be temporary or permanent).   The likelihood of either of these occurring depends on the difficulty of the procedure and whether you are sensitive to radiation due to previous procedures, disease, or genetic conditions.   IF your procedure requires a prolonged use of radiation, you will be notified and given written instructions for further action.  It is your responsibility to monitor the irradiated area for the 2 weeks following the procedure and to notify your physician if you are concerned that you have suffered a radiation induced injury.    All of the patient's questions were answered, patient is agreeable to proceed.  Consent signed and in chart.      Electronically Signed: D.  Rowe Robert, PA-C 03/20/2021, 8:10 AM   I spent a total of 30 minutes at the the patient's bedside AND on the patient's hospital floor or unit, greater than 50% of which was counseling/coordinating care for  pulmonary angiogram with catheter directed occlusion/embolization of pulmonary AVM/AV fistula.

## 2021-03-20 NOTE — Procedures (Signed)
Interventional Radiology Procedure Note  Procedure: Left pulmonary arteriography and embolization/occlusion of LLL AV malformation  Anesthesia: General  Complications: None  Estimated Blood Loss: < 10 mL  Findings: LLL pulmonary AV malformation/fistula treated by occlusion of medial LLL supplying arterial branch with Medtr[employer] MVP plug resulting in complete occlusion of arterial supply.  Plan: PACU recovery followed by overnight observation.  Mike [name]. Kathlene Cote, M.D Pager:  [phone]

## 2021-03-21 ENCOUNTER — Encounter (HOSPITAL_COMMUNITY): Payer: Self-pay | Admitting: Interventional Radiology

## 2021-03-21 DIAGNOSIS — Z79899 Other long term (current) drug therapy: Secondary | ICD-10-CM | POA: Diagnosis not present

## 2021-03-21 DIAGNOSIS — Z791 Long term (current) use of non-steroidal anti-inflammatories (NSAID): Secondary | ICD-10-CM | POA: Diagnosis not present

## 2021-03-21 DIAGNOSIS — I280 Arteriovenous fistula of pulmonary vessels: Secondary | ICD-10-CM | POA: Diagnosis not present

## 2021-03-21 DIAGNOSIS — Z89612 Acquired absence of left leg above knee: Secondary | ICD-10-CM | POA: Diagnosis not present

## 2021-03-21 DIAGNOSIS — Q2572 Congenital pulmonary arteriovenous malformation: Secondary | ICD-10-CM | POA: Diagnosis not present

## 2021-03-21 DIAGNOSIS — I252 Old myocardial infarction: Secondary | ICD-10-CM | POA: Diagnosis not present

## 2021-03-21 DIAGNOSIS — Z7902 Long term (current) use of antithrombotics/antiplatelets: Secondary | ICD-10-CM | POA: Diagnosis not present

## 2021-03-21 DIAGNOSIS — Z7982 Long term (current) use of aspirin: Secondary | ICD-10-CM | POA: Diagnosis not present

## 2021-03-21 DIAGNOSIS — I739 Peripheral vascular disease, unspecified: Secondary | ICD-10-CM | POA: Diagnosis not present

## 2021-03-21 DIAGNOSIS — Z8679 Personal history of other diseases of the circulatory system: Secondary | ICD-10-CM | POA: Diagnosis not present

## 2021-03-21 DIAGNOSIS — I2510 Atherosclerotic heart disease of native coronary artery without angina pectoris: Secondary | ICD-10-CM | POA: Diagnosis not present

## 2021-03-21 DIAGNOSIS — Z7901 Long term (current) use of anticoagulants: Secondary | ICD-10-CM | POA: Diagnosis not present

## 2021-03-21 DIAGNOSIS — Z8673 Personal history of transient ischemic attack (TIA), and cerebral infarction without residual deficits: Secondary | ICD-10-CM | POA: Diagnosis not present

## 2021-03-21 DIAGNOSIS — R29810 Facial weakness: Secondary | ICD-10-CM | POA: Diagnosis not present

## 2021-03-21 DIAGNOSIS — R4781 Slurred speech: Secondary | ICD-10-CM | POA: Diagnosis not present

## 2021-03-21 NOTE — Progress Notes (Signed)
Patient discharged to home. Verbalizes understanding of all discharge instructions including incision care and follow up MD visits.   Patient awaiting ride arrival.

## 2021-03-21 NOTE — Discharge Summary (Signed)
Patient ID: Mike [name] MRN: [id number] DOB/AGE: 07-Jul-1961 60 y.o.  Admit date: 03/20/2021 Discharge date: 03/21/2021  Supervising Physician: Corrie Mckusick  Patient Status: [facility] - In-pt  Admission Diagnoses: Pulmonary arteriovenous fistula  Discharge Diagnoses:  Active Problems:   Pulmonary AV (arteriovenous) fistula [facility])   Discharged Condition: good  Hospital Course:  Mike [name] is a 60 year old male smoker with recent multifocal stroke in December 2021 while undergoing physical therapy in rehab after leg amputation.  During work-up for his stroke, he was round to have a prominent and tortuous AV malformation/AV fistula of the medial right lower lobe. He presents to [facility] Radiology yesterday 3/23 for image-guided pulmonary angiogram with catheter-directed occlusion/embolization of the primary AVM/AV fistula.  He underwent successful occlusion of the medial LLL supplying arterial brand.  He was admitted overnight for observation. Patient assessed at bedside this AM.  He had an uneventful night.  Denies pain, cough, shortness of breath, hemoptysis. He is eating breakfast with tolerance at time of visit.  He has been urinating without issue.  He is ambulatory with a walker at baseline.  He is stable for discharge home today.  He plans to have his sister assist with transportation.  Per Dr. Kathlene Cote, he may resume all of his home medications today including his anticoagulation and antiplatelet therapies.  He will be scheduled for follow-up in clinic in approximately 4 weeks.  He understands the schedulers will contact him with date and time of follow-up appointment.     Discharge Exam: Blood pressure 123/60, pulse (!) 57, temperature 97.6 F (36.4 C), temperature source Oral, resp. rate 16, height 6' (1.829 m), weight 148 lb (67.1 kg), SpO2 100 %. General appearance: alert, cooperative and no distress Resp: clear to auscultation bilaterally Cardio: regular rate and rhythm, S1,  S2 normal, no murmur, click, rub or gallop GI: soft, non-tender; bowel sounds normal; no masses,  no organomegaly Skin: Skin color, texture, turgor normal. No rashes or lesions Incision/Wound:Groin procedure site intact, clean, and dry. Small amount of serous drainage on gauze, no blood. No tenderness.  No evidence of hematoma or pseudoaneurysm.   Disposition: Discharge disposition: 01-Home or Self Care       Discharge Instructions    Call MD for:  persistant dizziness or light-headedness   Complete by: As directed    Call MD for:  persistant nausea and vomiting   Complete by: As directed    Call MD for:  severe uncontrolled pain   Complete by: As directed    Call MD for:  temperature >100.4   Complete by: As directed    Diet - low sodium heart healthy   Complete by: As directed    Discharge instructions   Complete by: As directed    Increase activity slowly over the next several days.  Watch for changes to the groin procedure site.  Present to ED with any swelling, pain, or bleeding.  May resume anticoagulation medications/home medications today.  Follow-up with Dr. Kathlene Cote expected in 4 weeks.  Schedulers will contact you with date and time of appointment.   Increase activity slowly   Complete by: As directed    Remove dressing in 24 hours   Complete by: As directed    May remove dressing in 24 hrs. Replace with band-aid if wanted.     Allergies as of 03/21/2021   No Active Allergies     Medication List    TAKE these medications   acetaminophen 500 MG tablet  Commonly known as: TYLENOL Take 500-1,000 mg by mouth every 6 (six) hours as needed (pain).   aspirin EC 81 MG tablet Take 1 tablet (81 mg total) by mouth daily. Swallow whole. What changed: when to take this   clopidogrel 75 MG tablet Commonly known as: PLAVIX TAKE 1 TABLET(75 MG) BY MOUTH DAILY What changed: See the new instructions.   Entresto 49-51 MG Generic drug: sacubitril-valsartan Take 1 tablet  by mouth 2 (two) times daily.   furosemide 20 MG tablet Commonly known as: LASIX TAKE 1 TABLET(20 MG) BY MOUTH DAILY   gabapentin 300 MG capsule Commonly known as: NEURONTIN Take 1 capsule (300 mg total) by mouth 2 (two) times daily. What changed: when to take this   metoprolol succinate 25 MG 24 hr tablet Commonly known as: TOPROL-XL Take 1 tablet (25 mg total) by mouth daily. Take with or immediately following a meal. What changed: when to take this   nitroGLYCERIN 0.4 MG SL tablet Commonly known as: NITROSTAT Place 1 tablet (0.4 mg total) under the tongue every 5 (five) minutes as needed for chest pain. What changed: reasons to take this   rivaroxaban 2.5 MG Tabs tablet Commonly known as: XARELTO Take 1 tablet (2.5 mg total) by mouth 2 (two) times daily.   rosuvastatin 10 MG tablet Commonly known as: CRESTOR TAKE 1 TABLET(10 MG) BY MOUTH DAILY What changed: See the new instructions.       Follow-up Information    Aletta Edouard, MD Follow up.   Specialties: Interventional Radiology, Radiology Why: Schedulers will contact you with date and time of follow-up appointment.  Contact information: [address] [place] [address] [phone]                Electronically Signed: Docia Barrier, PA 03/21/2021, 10:35 AM   I have spent Greater Than 30 Minutes discharging Mike [name].

## 2021-03-22 DIAGNOSIS — I6309 Cerebral infarction due to thrombosis of other precerebral artery: Secondary | ICD-10-CM | POA: Diagnosis not present

## 2021-03-22 DIAGNOSIS — M6281 Muscle weakness (generalized): Secondary | ICD-10-CM | POA: Diagnosis not present

## 2021-03-22 DIAGNOSIS — E785 Hyperlipidemia, unspecified: Secondary | ICD-10-CM | POA: Diagnosis not present

## 2021-03-22 DIAGNOSIS — I213 ST elevation (STEMI) myocardial infarction of unspecified site: Secondary | ICD-10-CM | POA: Diagnosis not present

## 2021-03-22 DIAGNOSIS — I340 Nonrheumatic mitral (valve) insufficiency: Secondary | ICD-10-CM | POA: Diagnosis not present

## 2021-03-22 DIAGNOSIS — Z89612 Acquired absence of left leg above knee: Secondary | ICD-10-CM | POA: Diagnosis not present

## 2021-03-22 DIAGNOSIS — I5020 Unspecified systolic (congestive) heart failure: Secondary | ICD-10-CM | POA: Diagnosis not present

## 2021-03-22 DIAGNOSIS — N179 Acute kidney failure, unspecified: Secondary | ICD-10-CM | POA: Diagnosis not present

## 2021-03-22 DIAGNOSIS — Z4801 Encounter for change or removal of surgical wound dressing: Secondary | ICD-10-CM | POA: Diagnosis not present

## 2021-03-22 DIAGNOSIS — I10 Essential (primary) hypertension: Secondary | ICD-10-CM | POA: Diagnosis not present

## 2021-03-22 DIAGNOSIS — T8131XD Disruption of external operation (surgical) wound, not elsewhere classified, subsequent encounter: Secondary | ICD-10-CM | POA: Diagnosis not present

## 2021-03-22 DIAGNOSIS — I739 Peripheral vascular disease, unspecified: Secondary | ICD-10-CM | POA: Diagnosis not present

## 2021-03-22 DIAGNOSIS — Z720 Tobacco use: Secondary | ICD-10-CM | POA: Diagnosis not present

## 2021-03-22 DIAGNOSIS — R2689 Other abnormalities of gait and mobility: Secondary | ICD-10-CM | POA: Diagnosis not present

## 2021-03-22 DIAGNOSIS — R2990 Unspecified symptoms and signs involving the nervous system: Secondary | ICD-10-CM | POA: Diagnosis not present

## 2021-03-27 ENCOUNTER — Other Ambulatory Visit: Payer: Self-pay

## 2021-03-27 ENCOUNTER — Encounter (HOSPITAL_BASED_OUTPATIENT_CLINIC_OR_DEPARTMENT_OTHER): Payer: BC Managed Care – PPO | Admitting: Physician Assistant

## 2021-03-27 DIAGNOSIS — I252 Old myocardial infarction: Secondary | ICD-10-CM | POA: Diagnosis not present

## 2021-03-27 DIAGNOSIS — T8781 Dehiscence of amputation stump: Secondary | ICD-10-CM | POA: Diagnosis not present

## 2021-03-27 DIAGNOSIS — Z09 Encounter for follow-up examination after completed treatment for conditions other than malignant neoplasm: Secondary | ICD-10-CM | POA: Diagnosis not present

## 2021-03-27 DIAGNOSIS — Y835 Amputation of limb(s) as the cause of abnormal reaction of the patient, or of later complication, without mention of misadventure at the time of the procedure: Secondary | ICD-10-CM | POA: Diagnosis not present

## 2021-03-27 DIAGNOSIS — I2510 Atherosclerotic heart disease of native coronary artery without angina pectoris: Secondary | ICD-10-CM | POA: Diagnosis not present

## 2021-03-27 DIAGNOSIS — T8131XA Disruption of external operation (surgical) wound, not elsewhere classified, initial encounter: Secondary | ICD-10-CM | POA: Diagnosis not present

## 2021-03-27 DIAGNOSIS — I7389 Other specified peripheral vascular diseases: Secondary | ICD-10-CM | POA: Diagnosis not present

## 2021-03-27 DIAGNOSIS — Z89612 Acquired absence of left leg above knee: Secondary | ICD-10-CM | POA: Diagnosis not present

## 2021-03-27 NOTE — Progress Notes (Signed)
ESKER, DEVER ([id number]) Visit Report for 03/27/2021 Arrival Information Details Patient Name: Date of Service: Mike [name] 03/27/2021 10:45 A M Medical Record Number: [id number] Patient Account Number: [id number] Date of Birth/Sex: Treating RN: 09-28-1961 (60 y.o. [name] Primary Care Aamira Bischoff: [name] Other Clinician: Referring Joeangel Jeanpaul: Treating Ceniya Fowers/Extender: Mardella Layman, Manish Weeks in Treatment: 26 Visit Information History Since Last Visit Added or deleted any medications: No Patient Arrived: Wheel Chair Any new allergies or adverse reactions: No Arrival Time: 11:17 Had a fall or experienced change in No Accompanied By: sister activities of daily living that may affect Transfer Assistance: None risk of falls: Patient Identification Verified: Yes Signs or symptoms of abuse/neglect since last visito No Secondary Verification Process Completed: Yes Hospitalized since last visit: No Patient Requires Transmission-Based Precautions: No Implantable device outside of the clinic excluding No Patient Has Alerts: Yes cellular tissue based products placed in the center Patient Alerts: Patient on Blood Thinner since last visit: Has Dressing in Place as Prescribed: Yes Pain Present Now: No Electronic Signature(s) Signed: 03/27/2021 2:30:46 PM By: Sandre Kitty Entered By: Sandre Kitty on 03/27/2021 11:18:17 -------------------------------------------------------------------------------- Clinic Level of Care Assessment Details Patient Name: Date of Service: Mike [name] 03/27/2021 10:45 A M Medical Record Number: [id number] Patient Account Number: [id number] Date of Birth/Sex: Treating RN: 12/13/1961 (60 y.o. [name] Primary Care Zohar Maroney: [name] Other Clinician: Referring Esbeydi Manago: Treating Magaby Rumberger/Extender: Mardella Layman, Manish Weeks in Treatment: 17 Clinic Level of Care Assessment  Items TOOL 4 Quantity Score []  - 0 Use when only an EandM is performed on FOLLOW-UP visit ASSESSMENTS - Nursing Assessment / Reassessment X- 1 10 Reassessment of Co-morbidities (includes updates in patient status) X- 1 5 Reassessment of Adherence to Treatment Plan ASSESSMENTS - Wound and Skin A ssessment / Reassessment X - Simple Wound Assessment / Reassessment - one wound 1 5 []  - 0 Complex Wound Assessment / Reassessment - multiple wounds []  - 0 Dermatologic / Skin Assessment (not related to wound area) ASSESSMENTS - Focused Assessment []  - 0 Circumferential Edema Measurements - multi extremities []  - 0 Nutritional Assessment / Counseling / Intervention []  - 0 Lower Extremity Assessment (monofilament, tuning fork, pulses) []  - 0 Peripheral Arterial Disease Assessment (using hand held doppler) ASSESSMENTS - Ostomy and/or Continence Assessment and Care []  - 0 Incontinence Assessment and Management []  - 0 Ostomy Care Assessment and Management (repouching, etc.) PROCESS - Coordination of Care X - Simple Patient / Family Education for ongoing care 1 15 []  - 0 Complex (extensive) Patient / Family Education for ongoing care X- 1 10 Staff obtains Programmer, systems, Records, T Results / Process Orders est X- 1 10 Staff telephones HHA, Nursing Homes / Clarify orders / etc []  - 0 Routine Transfer to another Facility (non-emergent condition) []  - 0 Routine Hospital Admission (non-emergent condition) []  - 0 New Admissions / Biomedical engineer / Ordering NPWT Apligraf, etc. , []  - 0 Emergency Hospital Admission (emergent condition) X- 1 10 Simple Discharge Coordination []  - 0 Complex (extensive) Discharge Coordination PROCESS - Special Needs []  - 0 Pediatric / Minor Patient Management []  - 0 Isolation Patient Management []  - 0 Hearing / Language / Visual special needs []  - 0 Assessment of Community assistance (transportation, D/C planning, etc.) []  - 0 Additional  assistance / Altered mentation []  - 0 Support Surface(s) Assessment (bed, cushion, seat, etc.) INTERVENTIONS - Wound Cleansing / Measurement X - Simple Wound Cleansing -  one wound 1 5 []  - 0 Complex Wound Cleansing - multiple wounds X- 1 5 Wound Imaging (photographs - any number of wounds) []  - 0 Wound Tracing (instead of photographs) []  - 0 Simple Wound Measurement - one wound []  - 0 Complex Wound Measurement - multiple wounds INTERVENTIONS - Wound Dressings X - Small Wound Dressing one or multiple wounds 1 10 []  - 0 Medium Wound Dressing one or multiple wounds []  - 0 Large Wound Dressing one or multiple wounds []  - 0 Application of Medications - topical []  - 0 Application of Medications - injection INTERVENTIONS - Miscellaneous []  - 0 External ear exam []  - 0 Specimen Collection (cultures, biopsies, blood, body fluids, etc.) []  - 0 Specimen(s) / Culture(s) sent or taken to Lab for analysis []  - 0 Patient Transfer (multiple staff / Civil Service fast streamer / Similar devices) []  - 0 Simple Staple / Suture removal (25 or less) []  - 0 Complex Staple / Suture removal (26 or more) []  - 0 Hypo / Hyperglycemic Management (close monitor of Blood Glucose) []  - 0 Ankle / Brachial Index (ABI) - do not check if billed separately X- 1 5 Vital Signs Has the patient been seen at the hospital within the last three years: Yes Total Score: 90 Level Of Care: New/Established - Level 3 Electronic Signature(s) Signed: 03/27/2021 5:50:20 PM By: Baruch Gouty RN, [name] Entered By: Baruch Gouty on 03/27/2021 11:51:32 -------------------------------------------------------------------------------- Encounter Discharge Information Details Patient Name: Date of Service: [name] 03/27/2021 10:45 A M Medical Record Number: [id number] Patient Account Number: [id number] Date of Birth/Sex: Treating RN: 10/06/1961 (60 y.o. [name] Primary Care Grover Robinson: [name] Other  Clinician: Referring Lorelee Mclaurin: Treating Janneth Krasner/Extender: Mardella Layman, Manish Weeks in Treatment: 17 Encounter Discharge Information Items Discharge Condition: Stable Ambulatory Status: Wheelchair Discharge Destination: Home Transportation: Private Auto Accompanied By: sister Schedule Follow-up Appointment: Yes Clinical Summary of Care: Patient Declined Electronic Signature(s) Signed: 03/27/2021 5:50:20 PM By: Baruch Gouty RN, [name] Entered By: Baruch Gouty on 03/27/2021 11:54:51 -------------------------------------------------------------------------------- Multi-Disciplinary Care Plan Details Patient Name: Date of Service: [name] 03/27/2021 10:45 A M Medical Record Number: [id number] Patient Account Number: [id number] Date of Birth/Sex: Treating RN: 1961-09-28 (60 y.o. [name] Primary Care Laelle Bridgett: [name] Other Clinician: Referring Berkeley Veldman: Treating Evelynn Hench/Extender: Mardella Layman, Manish Weeks in Treatment: [place] reviewed with physician Active Inactive Electronic Signature(s) Signed: 03/27/2021 5:50:20 PM By: Baruch Gouty RN, [name] Entered By: Baruch Gouty on 03/27/2021 11:50:23 -------------------------------------------------------------------------------- Pain Assessment Details Patient Name: Date of Service: [name] 03/27/2021 10:45 A M Medical Record Number: [id number] Patient Account Number: [id number] Date of Birth/Sex: Treating RN: 05/07/1961 (60 y.o. [name] Primary Care Kojo Liby: [name] Other Clinician: Referring Bricia Taher: Treating Javares Kaufhold/Extender: Mardella Layman, Manish Weeks in Treatment: 17 Active Problems Location of Pain Severity and Description of Pain Patient Has Paino No Site Locations Pain Management and Medication Current Pain Management: Electronic Signature(s) Signed: 03/27/2021 2:30:46 PM By: Sandre Kitty Signed: 03/27/2021 5:50:20 PM By: Baruch Gouty RN, [name] Entered By: Sandre Kitty on 03/27/2021 11:18:39 -------------------------------------------------------------------------------- Patient/Caregiver Education Details Patient Name: Date of Service: Mike [name] 3/30/2022andnbsp10:45 A M Medical Record Number: [id number] Patient Account Number: [id number] Date of Birth/Gender: Treating RN: 1961/10/28 (60 y.o. [name] Primary Care Physician: [name] Other Clinician: Referring Physician: Treating Physician/Extender: Mardella Layman, Manish Weeks in Treatment: 53 Education  Assessment Education Provided To: Patient Education Topics Provided Wound/Skin Impairment: Methods: Explain/Verbal Responses: Reinforcements needed, State content correctly Electronic Signature(s) Signed: 03/27/2021 5:50:20 PM By: Baruch Gouty RN, [name] Entered By: Baruch Gouty on 03/27/2021 11:50:39 -------------------------------------------------------------------------------- Wound Assessment Details Patient Name: Date of Service: [name] 03/27/2021 10:45 A M Medical Record Number: [id number] Patient Account Number: [id number] Date of Birth/Sex: Treating RN: September 04, 1961 (60 y.o. [name] Primary Care Dante Roudebush: [name] Other Clinician: Referring Lan Entsminger: Treating Maigen Mozingo/Extender: Mardella Layman, Manish Weeks in Treatment: 17 Wound Status Wound Number: 3 Primary Dehisced Wound Etiology: Wound Location: Left, Medial Amputation Site - Above Knee Wound Healed - Epithelialized Wounding Event: Surgical Injury Status: Date Acquired: 11/06/2020 Comorbid Sleep Apnea, Coronary Artery Disease, Hypotension, Myocardial Weeks Of Treatment: 14 History: Infarction, Peripheral Arterial Disease Clustered Wound: No Wound Measurements Length: (cm) Width: (cm) Depth: (cm) Area: (cm) Volume: (cm) 0 % Reduction in  Area: 100% 0 % Reduction in Volume: 100% 0 Epithelialization: Large (67-100%) 0 Tunneling: No 0 Undermining: No Wound Description Classification: Full Thickness Without Exposed Support Structures Wound Margin: Flat and Intact Exudate Amount: Small Exudate Type: Serosanguineous Exudate Color: red, brown Foul Odor After Cleansing: No Slough/Fibrino Yes Wound Bed Granulation Amount: Medium (34-66%) Exposed Structure Granulation Quality: Red Fascia Exposed: No Necrotic Amount: Medium (34-66%) Fat Layer (Subcutaneous Tissue) Exposed: Yes Necrotic Quality: Eschar Tendon Exposed: No Muscle Exposed: No Joint Exposed: No Bone Exposed: No Electronic Signature(s) Signed: 03/27/2021 5:50:20 PM By: Baruch Gouty RN, [name] Entered By: Baruch Gouty on 03/27/2021 11:49:54 -------------------------------------------------------------------------------- Vitals Details Patient Name: Date of Service: [name] 03/27/2021 10:45 A M Medical Record Number: [id number] Patient Account Number: [id number] Date of Birth/Sex: Treating RN: September 10, 1961 (60 y.o. [name] Primary Care Robena Ewy: [name] Other Clinician: Referring Fumio Vandam: Treating Shaunette Gassner/Extender: Mardella Layman, Manish Weeks in Treatment: 17 Vital Signs Time Taken: 11:18 Temperature (F): 98.7 Height (in): 72 Pulse (bpm): 72 Weight (lbs): 135 Respiratory Rate (breaths/min): 17 Body Mass Index (BMI): 18.3 Blood Pressure (mmHg): 124/74 Reference Range: 80 - 120 mg / dl Electronic Signature(s) Signed: 03/27/2021 2:30:46 PM By: Sandre Kitty Entered By: Sandre Kitty on 03/27/2021 11:18:32

## 2021-03-27 NOTE — Progress Notes (Addendum)
EPHRAM, KORNEGAY ([id number]) Visit Report for 03/27/2021 Chief Complaint Document Details Patient Name: Date of Service: Mike [name] 03/27/2021 10:45 A M Medical Record Number: [id number] Patient Account Number: [id number] Date of Birth/Sex: Treating RN: 1961/07/17 (60 y.o. Mike [name] Primary Care Provider: [name] Other Clinician: Referring Provider: Treating Provider/Extender: Mardella Layman, Manish Weeks in Treatment: 17 Information Obtained from: Patient Chief Complaint Left gluteal pressure ulcer Electronic Signature(s) Signed: 03/27/2021 10:45:59 AM By: Worthy Keeler PA-C Previous Signature: 03/27/2021 10:45:43 AM Version By: Worthy Keeler PA-C Entered By: Worthy Keeler on 03/27/2021 10:45:58 -------------------------------------------------------------------------------- HPI Details Patient Name: Date of Service: Mike Butte S. 03/27/2021 10:45 A M Medical Record Number: [id number] Patient Account Number: [id number] Date of Birth/Sex: Treating RN: [birth year] (60 y.o. Mike [name] Primary Care Provider: [name] Other Clinician: Referring Provider: Treating Provider/Extender: Mardella Layman, Manish Weeks in Treatment: 17 History of Present Illness HPI Description: 11/28/2020 patient presents for initial evaluation here in our clinic concerning issues that he has been having with wounds actually in the left gluteal region. These occurred following his surgery and he actually had surgery which included a left above-knee amputation. Unfortunately this was necessitated by the fact that the patient had a fairly complicated recent vascular and cardiac history including a STEMI on 08/17/2020. This was complicated by cardiac arrest secondary to chronic ST elevations in the inferior leads, severe bilateral renal artery stenosis, peripheral arterial disease with critical limb ischemia requiring right iliac  revascularization of the left lower extremity, chronic congestive heart failure systolic with ischemic cardiomyopathy with complaints of shortness of breath, decreased ability to walk due to severe leg pain and again the patient had acute critical limb ischemia. He was too high of a risk being that he was on aspirin and Brilinta due to the recent STEMI and therefore was not a candidate for revascularization. Because he was decompensating so quickly it was elected to do an above-knee amputation to treat the progressing gangrene prior to this worsening significantly. This was done up by Dr. Donnetta Hutching on November 06, 2020. He did have a Foley catheter during the time that he was in the hospital. He was there from October 30, 2020 through November 15, 2020. It was during this time that he developed the wounds in the gluteal region on the left. The wounds appear to be in somewhat of a linear pattern that makes me in the suspect that he may have developed these as a result of having sat on a tubing such as a catheter tubing or something of the sort for too long causing a pressure injury although I cannot know that for sure. Not this far out. Currently the patient is on aspirin and Plavix and is currently in skilled nursing at [facility]. 12/19/2020 upon evaluation today patient appears to be doing well with regard to his gluteal wounds are completely healed. That is great news. The main issue is actually with his above-knee amputation stump on the left. He unfortunately is having trouble here with getting this to heal part of the medial aspect is eschar covered and I think this is good have to clear away in order to see this improve. I do believe however that we can initiate Santyl to try to help loosen this up in the future we should be able to debride this away much more effectively and quickly. 01/09/2021 upon evaluation today patient appears to be doing well with  regard to his wound currently. He actually  appears to be doing quite well and I do believe the [facility]eds has been beneficial currently. There is much looser necrotic tissue noted in the wound bed we are going to need to clean this out I do think a wound VAC may likely be ideal for him in the situation 01/16/2021 upon evaluation today patient appears to be doing well with regard to his wound. In fact the slough seems to be loosening up quite significantly today and the Santyl has really been doing a good job. He has gotten approval for the wound VAC and that should be delivered hopefully tomorrow 01/23/2021 on evaluation today patient actually appears to be doing quite well in regard to his wound. I do believe the wound VAC has done extremely well for him he said that since Friday and already just over the weekend he has noticed a lot of improvement even the home health nurse was surprised by how much improvement. There does not appear to be any evidence of active infection at this time. No fevers, chills, nausea, vomiting, or diarrhea. 02/06/2021 on evaluation today patient's wound actually appears to be doing quite well. This is good news. He did have a lot of pain over the weekend in the past week in general with his wound VAC that was removed and just a wet-to-dry dressing applied. He tells me that without the wound VAC on there is no pain. He tells me that he is really been having pain all along with the wound VAC nonetheless I do feel like this has done a great job for him as far as getting the wound to fill-in we have seen overall about a 75% improvement during the time we have been utilizing this. 02/13/2021 on evaluation today patient's wound actually showed signs of fairly good improvement which is great news. There does not appear to be any signs of active infection at this time also good news. With that being said he has been using the [facility] and that is doing a great job. He is really felt like he would like to just discontinue  wound VAC which I completely understand. 03/13/2021 on evaluation today patient actually appears to be doing extremely well with regard to his wound. Overall he has been using the [facility] and he is down to just 2 very tiny areas of opening there is really no depth whatsoever and in general his main issue is he is frustrated because he was hoping it would be completely healed today. I think he is doing excellent however. 03/27/2021 on evaluation today patient appears to be doing excellent in regard to his wound. He has been tolerating the dressing changes without complication. Fortunately everything appears to be completely healed at this point which is great news. He has his initial visit for his fitting for the prosthesis tomorrow. Electronic Signature(s) Signed: 03/27/2021 11:54:39 AM By: Worthy Keeler PA-C Entered By: Worthy Keeler on 03/27/2021 11:54:39 -------------------------------------------------------------------------------- Physical Exam Details Patient Name: Date of Service: Mike Butte S. 03/27/2021 10:45 A M Medical Record Number: [id number] Patient Account Number: [id number] Date of Birth/Sex: Treating RN: Jun 16, 1961 (60 y.o. Mike [name] Primary Care Provider: [name] Other Clinician: Referring Provider: Treating Provider/Extender: Mardella Layman, Manish Weeks in Treatment: 34 Constitutional Well-nourished and well-hydrated in no acute distress. Respiratory normal breathing without difficulty. Psychiatric this patient is able to make decisions and demonstrates good insight into disease process. Alert and Oriented  x 3. pleasant and cooperative. Notes Upon inspection patient's wound bed actually showed signs of good granulation epithelization at this point. There does not appear to be any evidence of infection which is great news and overall I am pleased with the fact this appears to be completely healed I would still protect the area  with a bandage over the region of new skin growth that has not completely toughened up but other than that I think he is doing quite well. Electronic Signature(s) Signed: 03/27/2021 11:55:01 AM By: Worthy Keeler PA-C Entered By: Worthy Keeler on 03/27/2021 11:55:01 -------------------------------------------------------------------------------- Physician Orders Details Patient Name: Date of Service: Mike Butte S. 03/27/2021 10:45 A M Medical Record Number: [id number] Patient Account Number: [id number] Date of Birth/Sex: Treating RN: June 08, 1961 (60 y.o. Mike [name] Primary Care Provider: [name] Other Clinician: Referring Provider: Treating Provider/Extender: Mardella Layman, Manish Weeks in Treatment: [phone] Verbal / Phone Orders: No Diagnosis Coding ICD-10 Coding Code Description [phone] Acquired absence of left leg above knee T81.31XA Disruption of external operation (surgical) wound, not elsewhere classified, initial encounter I25.10 Atherosclerotic heart disease of native coronary artery without angina pectoris I73.89 Other specified peripheral vascular diseases I10 Essential (primary) hypertension Discharge From [facility] Discharge from [place] Bathing/ Shower/ Hygiene May shower and wash wound with soap and water. Non Wound Condition Left Lower Extremity Protect area with: - bandaid for next 2-3 weeks [facility] for wound care. Other Home Health Orders/Instructions: - Interim Electronic Signature(s) Signed: 03/27/2021 5:20:30 PM By: Worthy Keeler PA-C Signed: 03/27/2021 5:50:20 PM By: Baruch Gouty RN, [name] Entered By: Baruch Gouty on 03/27/2021 11:54:07 -------------------------------------------------------------------------------- Problem List Details Patient Name: Date of Service: Mike Butte S. 03/27/2021 10:45 A M Medical Record Number: [id number] Patient Account Number: [id number] Date  of Birth/Sex: Treating RN: [birth year] (60 y.o. Mike [name] Primary Care Provider: [name] Other Clinician: Referring Provider: Treating Provider/Extender: Mardella Layman, Manish Weeks in Treatment: 17 Active Problems ICD-10 Encounter Code Description Active Date MDM Diagnosis [id number] Acquired absence of left leg above knee 01/16/2021 No Yes T81.31XA Disruption of external operation (surgical) wound, not elsewhere classified, 12/19/2020 No Yes initial encounter I25.10 Atherosclerotic heart disease of native coronary artery without angina pectoris 11/28/2020 No Yes I73.89 Other specified peripheral vascular diseases 11/28/2020 No Yes I10 Essential (primary) hypertension 11/28/2020 No Yes Inactive Problems Resolved Problems ICD-10 Code Description Active Date Resolved Date L89.323 Pressure ulcer of left buttock, stage 3 11/28/2020 11/28/2020 Electronic Signature(s) Signed: 03/27/2021 10:45:37 AM By: Worthy Keeler PA-C Entered By: Worthy Keeler on 03/27/2021 10:45:37 -------------------------------------------------------------------------------- Progress Note Details Patient Name: Date of Service: [name] 03/27/2021 10:45 A M Medical Record Number: [id number] Patient Account Number: [id number] Date of Birth/Sex: Treating RN: Jun 12, 1961 (60 y.o. Mike [name] Primary Care Provider: [name] Other Clinician: Referring Provider: Treating Provider/Extender: Mardella Layman, Manish Weeks in Treatment: 17 Subjective Chief Complaint Information obtained from Patient Left gluteal pressure ulcer History of Present Illness (HPI) 11/28/2020 patient presents for initial evaluation here in our clinic concerning issues that he has been having with wounds actually in the left gluteal region. These occurred following his surgery and he actually had surgery which included a left above-knee amputation. Unfortunately this was necessitated by  the fact that the patient had a fairly complicated recent vascular and cardiac history including a STEMI on 08/17/2020. This was complicated by cardiac arrest  secondary to chronic ST elevations in the inferior leads, severe bilateral renal artery stenosis, peripheral arterial disease with critical limb ischemia requiring right iliac revascularization of the left lower extremity, chronic congestive heart failure systolic with ischemic cardiomyopathy with complaints of shortness of breath, decreased ability to walk due to severe leg pain and again the patient had acute critical limb ischemia. He was too high of a risk being that he was on aspirin and Brilinta due to the recent STEMI and therefore was not a candidate for revascularization. Because he was decompensating so quickly it was elected to do an above-knee amputation to treat the progressing gangrene prior to this worsening significantly. This was done up by Dr. Donnetta Hutching on November 06, 2020. He did have a Foley catheter during the time that he was in the hospital. He was there from October 30, 2020 through November 15, 2020. It was during this time that he developed the wounds in the gluteal region on the left. The wounds appear to be in somewhat of a linear pattern that makes me in the suspect that he may have developed these as a result of having sat on a tubing such as a catheter tubing or something of the sort for too long causing a pressure injury although I cannot know that for sure. Not this far out. Currently the patient is on aspirin and Plavix and is currently in skilled nursing at [facility]. 12/19/2020 upon evaluation today patient appears to be doing well with regard to his gluteal wounds are completely healed. That is great news. The main issue is actually with his above-knee amputation stump on the left. He unfortunately is having trouble here with getting this to heal part of the medial aspect is eschar covered and I think this is  good have to clear away in order to see this improve. I do believe however that we can initiate Santyl to try to help loosen this up in the future we should be able to debride this away much more effectively and quickly. 01/09/2021 upon evaluation today patient appears to be doing well with regard to his wound currently. He actually appears to be doing quite well and I do believe the [facility]eds has been beneficial currently. There is much looser necrotic tissue noted in the wound bed we are going to need to clean this out I do think a wound VAC may likely be ideal for him in the situation 01/16/2021 upon evaluation today patient appears to be doing well with regard to his wound. In fact the slough seems to be loosening up quite significantly today and the Santyl has really been doing a good job. He has gotten approval for the wound VAC and that should be delivered hopefully tomorrow 01/23/2021 on evaluation today patient actually appears to be doing quite well in regard to his wound. I do believe the wound VAC has done extremely well for him he said that since Friday and already just over the weekend he has noticed a lot of improvement even the home health nurse was surprised by how much improvement. There does not appear to be any evidence of active infection at this time. No fevers, chills, nausea, vomiting, or diarrhea. 02/06/2021 on evaluation today patient's wound actually appears to be doing quite well. This is good news. He did have a lot of pain over the weekend in the past week in general with his wound VAC that was removed and just a wet-to-dry dressing applied. He tells me that without  the wound VAC on there is no pain. He tells me that he is really been having pain all along with the wound VAC nonetheless I do feel like this has done a great job for him as far as getting the wound to fill-in we have seen overall about a 75% improvement during the time we have been utilizing this. 02/13/2021 on  evaluation today patient's wound actually showed signs of fairly good improvement which is great news. There does not appear to be any signs of active infection at this time also good news. With that being said he has been using the [facility] and that is doing a great job. He is really felt like he would like to just discontinue wound VAC which I completely understand. 03/13/2021 on evaluation today patient actually appears to be doing extremely well with regard to his wound. Overall he has been using the [facility] and he is down to just 2 very tiny areas of opening there is really no depth whatsoever and in general his main issue is he is frustrated because he was hoping it would be completely healed today. I think he is doing excellent however. 03/27/2021 on evaluation today patient appears to be doing excellent in regard to his wound. He has been tolerating the dressing changes without complication. Fortunately everything appears to be completely healed at this point which is great news. He has his initial visit for his fitting for the prosthesis tomorrow. Objective Constitutional Well-nourished and well-hydrated in no acute distress. Vitals Time Taken: 11:18 AM, Height: 72 in, Weight: 135 lbs, BMI: 18.3, Temperature: 98.7 F, Pulse: 72 bpm, Respiratory Rate: 17 breaths/min, Blood Pressure: 124/74 mmHg. Respiratory normal breathing without difficulty. Psychiatric this patient is able to make decisions and demonstrates good insight into disease process. Alert and Oriented x 3. pleasant and cooperative. General Notes: Upon inspection patient's wound bed actually showed signs of good granulation epithelization at this point. There does not appear to be any evidence of infection which is great news and overall I am pleased with the fact this appears to be completely healed I would still protect the area with a bandage over the region of new skin growth that has not completely toughened up  but other than that I think he is doing quite well. Integumentary (Hair, Skin) Wound #3 status is Healed - Epithelialized. Original cause of wound was Surgical Injury. The date acquired was: 11/06/2020. The wound has been in treatment 14 weeks. The wound is located on the Left,Medial Amputation Site - Above Knee. The wound measures 0cm length x 0cm width x 0cm depth; 0cm^2 area and 0cm^3 volume. There is Fat Layer (Subcutaneous Tissue) exposed. There is no tunneling or undermining noted. There is a small amount of serosanguineous drainage noted. The wound margin is flat and intact. There is medium (34-66%) red granulation within the wound bed. There is a medium (34-66%) amount of necrotic tissue within the wound bed including Eschar. Assessment Active Problems ICD-10 Acquired absence of left leg above knee Disruption of external operation (surgical) wound, not elsewhere classified, initial encounter Atherosclerotic heart disease of native coronary artery without angina pectoris Other specified peripheral vascular diseases Essential (primary) hypertension Plan Discharge From [facility] Services: Discharge from [place] Bathing/ Shower/ Hygiene: May shower and wash wound with soap and water. Non Wound Condition: Protect area with: - bandaid for next 2-3 weeks Home Health: [facility] for wound care. Other Home Health Orders/Instructions: - Interim 1. Would recommend that we  continue with the wound care measures as before with protection but no dressings are necessary as the wound does appear to be healed we just 1 to protect the newly closed skin. The patient is in agreement with that plan. 2. I think he is okay to proceed with the appointment with the prosthesis clinic or clinician as honestly I think that this will help him as far as his overall morale and obviously that is what we have been aiming for is to get this thing closed so that he could proceed. We will see him  back for follow-up visit as needed if anything changes or worsens. Electronic Signature(s) Signed: 03/27/2021 11:55:47 AM By: Worthy Keeler PA-C Entered By: Worthy Keeler on 03/27/2021 11:55:46 -------------------------------------------------------------------------------- SuperBill Details Patient Name: Date of Service: Mike Butte S. 03/27/2021 Medical Record Number: [id number] Patient Account Number: [id number] Date of Birth/Sex: Treating RN: [birth year] (60 y.o. Mike [name] Primary Care Provider: [name] Other Clinician: Referring Provider: Treating Provider/Extender: Mardella Layman, Manish Weeks in Treatment: 17 Diagnosis Coding ICD-10 Codes Code Description [phone] Acquired absence of left leg above knee T81.31XA Disruption of external operation (surgical) wound, not elsewhere classified, initial encounter I25.10 Atherosclerotic heart disease of native coronary artery without angina pectoris I73.89 Other specified peripheral vascular diseases I10 Essential (primary) hypertension Facility Procedures CPT4 Code: [id number] Description: 99213 - WOUND CARE VISIT-LEV 3 EST PT Modifier: Quantity: 1 Physician Procedures : CPT4 Code Description Modifier [id number] - WC PHYS LEVEL 3 - EST PT ICD-10 Diagnosis Description Z89.612 Acquired absence of left leg above knee T81.31XA Disruption of external operation (surgical) wound, not elsewhere classified, initial encounter  I25.10 Atherosclerotic heart disease of native coronary artery without angina pectoris I73.89 Other specified peripheral vascular diseases Quantity: 1 Electronic Signature(s) Signed: 03/27/2021 11:56:03 AM By: Worthy Keeler PA-C Entered By: Worthy Keeler on 03/27/2021 11:56:02

## 2021-04-01 ENCOUNTER — Encounter: Payer: Self-pay | Admitting: Cardiology

## 2021-04-01 ENCOUNTER — Other Ambulatory Visit: Payer: Self-pay

## 2021-04-01 ENCOUNTER — Ambulatory Visit: Payer: BC Managed Care – PPO | Admitting: Cardiology

## 2021-04-01 VITALS — BP 120/81 | HR 76 | Temp 98.2°F | Resp 16 | Ht 72.0 in | Wt 142.0 lb

## 2021-04-01 DIAGNOSIS — Z8673 Personal history of transient ischemic attack (TIA), and cerebral infarction without residual deficits: Secondary | ICD-10-CM

## 2021-04-01 DIAGNOSIS — I2510 Atherosclerotic heart disease of native coronary artery without angina pectoris: Secondary | ICD-10-CM

## 2021-04-01 DIAGNOSIS — I739 Peripheral vascular disease, unspecified: Secondary | ICD-10-CM | POA: Diagnosis not present

## 2021-04-01 DIAGNOSIS — I5020 Unspecified systolic (congestive) heart failure: Secondary | ICD-10-CM

## 2021-04-01 NOTE — Progress Notes (Addendum)
Follow up visit  Subjective:   Mike [name], male    DOB: July 04, 1961, 60 y.o.   MRN: [id number]    HPI   Chief Complaint  Patient presents with  . Follow-up    60 y.o.Caucasian male with CAD, culprit (RCA) and nonculprit (LCx) PCI after STEMI 07/2020, HFrEF, moderate MR, h/o cardiac arrest 2/2 Torsades post PCI during index hospitalization, PAD with critical limb ischemia s/p Rt iliac revascularization, and s/p Lt AKA amputation for critical limb ischemia,  severe bilateral renal artery stenoses w/h/o CIN (09/2020), multifocal stroke s/p tPA with resolution of neurodeficit (11/2020), large AV pulmonary fistula-s/p closure by Dr. Kathlene Cote (02/2021), small PFO.  Patient is feeling "the best I have in a long time". He denies chest pain, shortness of breath, palpitations, leg edema, orthopnea, PND, TIA/syncope.  He was evaluated by Dr. Leonie Man and was recommended evaluation for Afib with loop recorder.  On a separate note, his left leg amputation stump is healed well. He is now off the wound vac. He is awaiting prosthesis and looking forward to using it.   Current Outpatient Medications on File Prior to Visit  Medication Sig Dispense Refill  . acetaminophen (TYLENOL) 500 MG tablet Take 500-1,000 mg by mouth every 6 (six) hours as needed (pain).    Marland Kitchen aspirin EC 81 MG tablet Take 1 tablet (81 mg total) by mouth daily. Swallow whole. (Patient taking differently: Take 81 mg by mouth every evening. Swallow whole.) 90 tablet 0  . clopidogrel (PLAVIX) 75 MG tablet TAKE 1 TABLET(75 MG) BY MOUTH DAILY (Patient taking differently: Take 75 mg by mouth every evening.) 30 tablet 6  . furosemide (LASIX) 20 MG tablet TAKE 1 TABLET(20 MG) BY MOUTH DAILY 30 tablet 0  . gabapentin (NEURONTIN) 300 MG capsule Take 1 capsule (300 mg total) by mouth 2 (two) times daily. (Patient taking differently: Take 300 mg by mouth in the morning and at bedtime.) 60 capsule 0  . metoprolol succinate (TOPROL-XL) 25 MG 24  hr tablet Take 1 tablet (25 mg total) by mouth daily. Take with or immediately following a meal. (Patient taking differently: Take 25 mg by mouth every evening. Take with or immediately following a meal.) 90 tablet 3  . nitroGLYCERIN (NITROSTAT) 0.4 MG SL tablet Place 1 tablet (0.4 mg total) under the tongue every 5 (five) minutes as needed for chest pain. (Patient taking differently: Place 0.4 mg under the tongue every 5 (five) minutes as needed for chest pain (max 3 doses).) 30 tablet 1  . rivaroxaban (XARELTO) 2.5 MG TABS tablet Take 1 tablet (2.5 mg total) by mouth 2 (two) times daily. 60 tablet 3  . rosuvastatin (CRESTOR) 10 MG tablet TAKE 1 TABLET(10 MG) BY MOUTH DAILY (Patient taking differently: Take 10 mg by mouth every evening.) 30 tablet 0  . sacubitril-valsartan (ENTRESTO) 49-51 MG Take 1 tablet by mouth 2 (two) times daily. 60 tablet 2  . Multiple Vitamins-Minerals (MULTI FOR HIM 50+) TABS Take 1 tablet by mouth See admin instructions.     No current facility-administered medications on file prior to visit.    Cardiovascular & other pertient studies:  EKG 03/20/2021: Sinus rhythm 66 bpm Old inferior infarct  Mobile cardiac telemetry 14 days 12/20/2020 - 01/03/2021:  Dominant rhythm: Sinus.  HR 31-167 bpm. Avg HR 76 bpm.  14 episodes of SVT/atrial tachycardia, fastest at 167 bpm, longest for 18.7 sec  <1%% isolated SVE, couplet/triplets.  6 episodes of VT/idioventricular rhythm, fastest at 156 bpm, longest for  10 beats  8% isolated VE, 40 sec bigeminy, 75m17 sec trigeminy.  2 episodes of sinus pauses, 4-4.6 sec noted during sleep hours.  No atrial fibrillation/atrial flutter//high grade AV block  0 patient triggered events.   Echocardiogram 01/21/2021:  Left ventricle cavity is normal in size. Mild concentric hypertrophy of  the left ventricle. Left ventricle regional wall motion findings: Basal  inferolateral and Basal inferior akinesis, mild global hypokinesis.   Moderately  depressed LV systolic function with visual EF 35-40%. Doppler  evidence of grade I (impaired) diastolic dysfunction, normal LAP.  Left atrial cavity is moderately dilated.  Structurally normal mitral valve. No evidence of mitral stenosis. Moderate  to severe secondary mitral regurgitation due basal inferior akinesis.  Mild tricuspid regurgitation.  No evidence of pulmonary hypertension.  Compared to previous study on 09/11/2020, LVEF appears improved from  15-20%. Mitral regurgitation is increased in severity from mild to  moderate.   CTA Chest 12/14/2020: 1. Large 3.1 x 1.9 cm pulmonary arteriovenous fistula in the medial left lower lobe. 2. Right middle lobe 3 mm solid pulmonary nodule. Follow-up noncontrast chest CT recommended in 12 months in this high risk patient.This recommendation follows the consensus statement: Guidelines for Management of Incidental Pulmonary Nodules Detected on CT Images: From the Fleischner Society 2017; Radiology 2017; 284:228-243. 3. Mild cardiomegaly. 4. Aortic Atherosclerosis (ICD10-I70.0) and Emphysema (ICD10-J43.9).   PV intervention 10/16/2020: Successful intravascular lithotripsy, PTCA and stenting 7.0X39 mm balloon expandable Viabahn VBX stent Rt common iliac artery  7.0X60 mm self expanding Absolute Pro stent Rt distal iliac artery  No named vessels at ankle level in both lower extremities.  Will consult vascular surgery for Rt fem-to-left profunda bypass given left foot critical limb ischemia.  Abdominal Aortic Duplex 09/21/2020:  Moderate plaque noted in the proximal, mid and distal aorta. There is an  ulcerated plaque noted in the distal abdominal aorta. No AAA. Normal iliac  artery velocity.  Lower Extremity Arterial Duplex 09/21/2020:  The right SFA is occluded in the proximal segment with reconstitution at  the level of the popliteal artery with diffuse monophasic waveform below  the knee. Right profunda femoral artery  has >50% stenosis. There is  moderate mixed plaque noted throughout the right lower extremity.  Monophasic waveform throughout the left lower extremity, indicates  significant proximal disease (iliac artery).   Left SFA is occluded in the proximal segment and reconstitutes just above popliteal artery and diffuse dampened monophasic waveform throughout the lower extremity below the  knee. There is moderate mixed plaque throughout the left lower extremity.   This exam reveals severely decreased perfusion of the right lower  extremity, noted at the dorsalis pedis artery level (ABI 0.42) and  critically decreased perfusion of the left lower extremity, noted at the  dorsalis pedis and post tibial artery level (ABI 0.03).   Coronary intervention 08/03/2020: LM: Distal 40% stenosis LAD: Mid 30% disease LCx: Subtotally occluded OM2, prox LCx 70% stenosis Successful percutaneous coronary intervention OM2-Prox LCx PTCA and overlapping stents placement  2.5 X 38 mm and 2.5 X 18 mm Resolute Onyx drug-eluting stents 100%--->0% stenosis. TIMI flow 0-->III Small caliber distal vessel with moderate diffuse disease RCA: Prox 30% stenosis. Patent mid RCA sttent 2.5 X 26 mm Resolute Onyx drug-eluting stent  LVEDP 42 mmHg  Coronary intervention 08/01/2020: LM: Distal 30% stenosis LAD: Mid 30% disease LCx: Subtotally occluded OM2, bridging left-to-left and right-to-left collaterals RCA: Prox 30% stenosis. Mid 100% occlusion Successful percutaneous coronary intervention mid RCA PTCA and  stent placement 2.5 X 26 mm Resolute Onyx drug-eluting stent 100%--->0% stenosis. TIMI flow 0-->III   Recent labs: 03/20/2021: Glucose 85, BUN/Cr 19/1.05. EGFR >60. Na/K 139/3.8. Rest of the CMP normal H/H 13/42. MCV 84. Platelets 212   01/31/2021: Glucose 118, BUN/Cr 22/1.0. EGFR 81. Na/K 145/3.8.   12/14/2020: Glucose 100, BUN/Cr 13/1.09. EGFR  >60. Na/K 140/3.9.  H/H 11.6/35.5. MCV 90.6. Platelets 202 HbA1C 4.6% Chol 130, TG 71, HDL 42, LDL 74  09/25/2020: Glucose 102, BUN/Cr 18/1.16. EGFR 68. Na/K 140/4.5.  NT pro BNP 2404  09/11/2020: Glucose 114, BUN/Cr 19/1.42. EGFR 53. Na/K 136/4.7.  Chol 95, TG 76, HDL 30, LDL 49  08/04/2020: Glucose 101, BUN/Cr 19/1.08. EGFR >60. Na/K 139/4.0. Rest of the CMP normal H/H 14/42. MCV 89. Platelets 184. WBC count 14k HbA1C 5.8% Chol 178, TG 75, HDL 40, LDL 123    Review of Systems  Cardiovascular: Negative for chest pain, claudication, dyspnea on exertion, leg swelling, palpitations and syncope.  Respiratory: Negative for shortness of breath.          Vitals:   04/01/21 1136  BP: 120/81  Pulse: 76  Resp: 16  Temp: 98.2 F (36.8 C)  SpO2: 99%     Body mass index is 19.26 kg/m. Filed Weights   04/01/21 1136  Weight: 142 lb (64.4 kg)     Objective:   Physical Exam Vitals and nursing note reviewed.  Constitutional:      General: He is not in acute distress.    Appearance: He is well-developed.  Neck:     Vascular: No JVD.  Cardiovascular:     Rate and Rhythm: Normal rate and regular rhythm.     Pulses:          Dorsalis pedis pulses are 0 on the right side.       Posterior tibial pulses are 0 on the right side.     Heart sounds: No murmur heard.   Pulmonary:     Effort: Pulmonary effort is normal.  Musculoskeletal:     Right lower leg: No edema.     Comments: S/p left AKA Amputation stump healing well  Neurological:     Mental Status: He is alert and oriented to person, place, and time.           Assessment & Recommendations:   60 y.o.Caucasian male with CAD, culprit (RCA) and nonculprit (LCx) PCI after STEMI 07/2020, HFrEF, moderate MR, h/o cardiac arrest 2/2 Torsades post PCI during index hospitalization, PAD with critical limb ischemia s/p Rt iliac revascularization, and s/p Lt AKA amputation for critical limb ischemia,  severe bilateral renal  artery stenoses w/h/o CIN (09/2020), stroke s/p tPA with resolution of neurodeficit (11/2020), multifocal stroke on MRI suspicious for cardioembolic source, small PFO/secundum ASD and large pulmonary AV fistula  Stroke: Multifocal stroke with small PFO/secundum ASD but large pulmonary AV fistula (11/2020), now s/p closure by Dr. Kathlene Cote (02/2021) Repeat TCD bubble study as pr Dr. Clydene Fake recommendations Previously, loop recorder was not performed as patient was still being considered for ICD placement. In that duration, patient had prolonged and recurrent hospitalizations as well as two week external cardiac telemtry with no Afib noted. Patient's EF has now improved and does not need ICD placement. Will arrange for loop recorder placement to look for any Afib.  PAD: Successful revascularization of right common iliac and right external iliac artery (09/2020) S/p Lt AKA. Currently has wound vac. Encouraged him to f/u w/Dr. Stephens Shire office re: prosthesis. No  critical limb ischemia, although he has pre-ulcerative calloses on right sole.   HFrEF, secondary mitral regurgitation: Euvolumic. EF 35-40%. No ICD indicated. Continue Entresto 49-51 mg bid, metoprolol succinate 25 mg daily. Will recheck echocardiogram in 05/2021.   CAD: S/p STEMI 07/2020. Currently no angina symptoms Conitnue Aspirin/plavix, Crestor. Given severe CAD, PAD, added Xarelto 2.5 mg bid (COMPASS trial).  No bleeding issues. Will stop Aspirin in 07/2021 (1 year since ACS) Continue Crestor 20 mg  F/u in July 2022.   Nigel Mormon, MD Pager: [phone] Office: [phone]

## 2021-04-01 NOTE — Addendum Note (Signed)
Addended by: Nigel Mormon on: 04/01/2021 05:00 PM   Modules accepted: Level of Service

## 2021-04-03 ENCOUNTER — Ambulatory Visit (INDEPENDENT_AMBULATORY_CARE_PROVIDER_SITE_OTHER): Payer: BC Managed Care – PPO | Admitting: Family

## 2021-04-03 ENCOUNTER — Encounter: Payer: Self-pay | Admitting: Family

## 2021-04-03 DIAGNOSIS — Z89612 Acquired absence of left leg above knee: Secondary | ICD-10-CM | POA: Diagnosis not present

## 2021-04-03 NOTE — Progress Notes (Addendum)
Post-Op Visit Note   Patient: Mike [name]           Date of Birth: [birth year]           MRN: [id number] Visit Date: 04/03/2021 PCP: Ephriam Jenkins, PA  Chief Complaint: No chief complaint on file.   HPI:  HPI The patient is a 60 year old gentleman who presents status post left above-knee amputation November 9 of last year.  Surgery was done with Dr. Oneida Alar.  At this time he is working with Gerald Stabs from [place] would like to get set up with a prosthesis.  He is feels ready to return to work.  For work he is transitioning between sitting standing and walking frequently throughout the day will need prosthesis to keep him safe while performing his work duties.  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  He plans to return to work for full weightbearing without assistive device.  He certainly has the well and the strength to be successful with his prosthesis.  Patient is a new left transfemoral amputee.  Patient's current comorbidities are not expected to impact the ability to function with the prescribed prosthesis. Patient verbally communicates a strong desire to use a prosthesis. Patient currently requires mobility aids to ambulate without a prosthesis.  Expects not to use mobility aids with a new prosthesis.  Patient is a K3 level ambulator that spends a lot of time walking around on uneven terrain over obstacles, up and down stairs, and ambulates with a variable cadence.  The patient will benefit from an MPK knee because they frequently encounter uneven terrain, stairs, and sometimes have to walk backwards. The "stumble recovery" and "intuitive stance" features will reduce fall risk and allow the patient to walk down stairs "step over step."  Ortho Exam On examination of the left above-knee amputation the incision is well-healed there is 1 ulcerative area along the incision this is 15 mm x 8 mm covered with eschar.  There is scant dried blood there is no surrounding  erythema drainage no sign of infection Visit Diagnoses:  1. Left above-knee amputee [facility])     Plan: He has been released from wound care.  He is currently cleansing the wound daily applying a dry dressing.  He is also using a shrinker.  He will continue with this.  Follow with [facility] for prosthesis set up.  Follow-Up Instructions: No follow-ups on file.   Imaging: No results found.  Orders:  No orders of the defined types were placed in this encounter.  No orders of the defined types were placed in this encounter.    PMFS History: Patient Active Problem List   Diagnosis Date Noted  . AVM (arteriovenous malformation) 03/20/2021  . Pulmonary AV (arteriovenous) fistula ([place]) 02/20/2021  . Pre-ulcerative calluses 12/20/2020  . Cerebral thrombosis with cerebral infarction 12/11/2020  . Cerebrovascular accident (CVA) due to embolism of cerebral artery ([place]) 12/11/2020  . Pressure injury of skin 11/12/2020  . Acute urinary retention 11/09/2020  . Normal anion gap metabolic acidosis 68/34/1962  . Hypotension 11/07/2020  . Chest pain 10/31/2020  . Acute on chronic HFrEF (heart failure with reduced ejection fraction) ([place])   . Goals of care, counseling/discussion   . Palliative care by specialist   . DNR (do not resuscitate)   . Critical limb ischemia with history of revascularization of same extremity ([place]) 10/30/2020  . Hyperglycemia 10/30/2020  . Tobacco abuse 10/30/2020  . Leukocytosis 10/30/2020  . Chronic combined systolic  and diastolic CHF (congestive heart failure) ([place]) 10/30/2020  . Ischemic cardiomyopathy 10/24/2020  . AKI (acute kidney injury) ([place]) 10/17/2020  . PAD (peripheral artery disease) ([place]) 10/16/2020  . Critical lower limb ischemia ([place]) 10/05/2020  . Rest pain of left upper extremity due to atherosclerosis ([place]) 10/05/2020  . Severe claudication ([place]) 09/20/2020  . Paresthesia of both lower extremities 09/20/2020  . H/O cardiac arrest 09/20/2020  . H/O  acute myocardial infarction 08/14/2020  . Snoring 08/14/2020  . Sinus pause 08/14/2020  . Mixed hyperlipidemia 08/14/2020  . Tobacco dependence 08/14/2020  . Coronary artery disease involving native coronary artery of native heart without angina pectoris 08/10/2020  . HFrEF (heart failure with reduced ejection fraction) ([place]) 08/10/2020  . Nonrheumatic mitral valve regurgitation 08/10/2020  . STEMI (ST elevation myocardial infarction) ([place]) 08/02/2020  . Accelerated hypertension 08/01/2020   Past Medical History:  Diagnosis Date  . Anxiety   . CAD (coronary artery disease)   . Heart attack ([place]) 08/01/2020  . Heart failure with reduced ejection fraction ([place])   . Hyperlipidemia   . Hypertension   . PAD (peripheral artery disease) ([place])    right CIA & right distal iliac artery stents 10/16/20; left AKA 11/06/20  . PFO (patent foramen ovale) 12/14/2020  . Pneumonia   . Pulmonary AV (arteriovenous) fistula ([place]) 12/14/2020  . Stroke ([place]) 12/11/2020   no residuial effects    Family History  Problem Relation Age of Onset  . Heart disease Mother   . Heart disease Father   . Cancer Brother     Past Surgical History:  Procedure Laterality Date  . ABDOMINAL AORTOGRAM W/LOWER EXTREMITY N/A 10/16/2020   Procedure: ABDOMINAL AORTOGRAM W/LOWER EXTREMITY;  Surgeon: Nigel Mormon, MD;  Location: [place] [facility];  Service: Cardiovascular;  Laterality: N/A;  . AMPUTATION Left 11/06/2020   Procedure: AMPUTATION ABOVE KNEE;  Surgeon: Rosetta Posner, MD;  Location: [facility];  Service: Vascular;  Laterality: Left;  . BUBBLE STUDY  12/14/2020   Procedure: BUBBLE STUDY;  Surgeon: Nigel Mormon, MD;  Location: [facility];  Service: Cardiovascular;;  . CARDIAC CATHETERIZATION    . CORONARY STENT INTERVENTION N/A 08/03/2020   Procedure: CORONARY STENT INTERVENTION;  Surgeon: Nigel Mormon, MD;  Location: [place] [facility];  Service: Cardiovascular;  Laterality: N/A;  .  CORONARY/GRAFT ACUTE MI REVASCULARIZATION N/A 08/01/2020   Procedure: Coronary/Graft Acute MI Revascularization;  Surgeon: Nigel Mormon, MD;  Location: [place] [facility];  Service: Cardiovascular;  Laterality: N/A;  . INTRAVASCULAR LITHOTRIPSY Right 10/16/2020   Procedure: INTRAVASCULAR LITHOTRIPSY;  Surgeon: Nigel Mormon, MD;  Location: [place] [facility];  Service: Cardiovascular;  Laterality: Right;  Common and External Iliac  . IR ANGIOGRAM PULMONARY LEFT SELECTIVE  03/20/2021  . IR ANGIOGRAM SELECTIVE EACH ADDITIONAL VESSEL  03/20/2021  . IR EMBO ARTERIAL NOT HEMORR HEMANG INC GUIDE ROADMAPPING  03/20/2021  . IR RADIOLOGIST EVAL & MGMT  01/30/2021  . IR US GUIDE VASC ACCESS RIGHT  03/20/2021  . LEFT HEART CATH AND CORONARY ANGIOGRAPHY N/A 08/01/2020   Procedure: LEFT HEART CATH AND CORONARY ANGIOGRAPHY;  Surgeon: Nigel Mormon, MD;  Location: [place] [facility];  Service: Cardiovascular;  Laterality: N/A;  . LEFT HEART CATH AND CORONARY ANGIOGRAPHY N/A 08/03/2020   Procedure: LEFT HEART CATH AND CORONARY ANGIOGRAPHY;  Surgeon: Nigel Mormon, MD;  Location: [place] [facility];  Service: Cardiovascular;  Laterality: N/A;  . PERIPHERAL VASCULAR INTERVENTION Right 10/16/2020  Procedure: PERIPHERAL VASCULAR INTERVENTION;  Surgeon: Nigel Mormon, MD;  Location: [place] [facility];  Service: Cardiovascular;  Laterality: Right;  Common and Iliac Stent  . RADIOLOGY WITH ANESTHESIA N/A 03/20/2021   Procedure: PULMONARY EMBOLIZATION;  Surgeon: Aletta Edouard, MD;  Location: [facility];  Service: Radiology;  Laterality: N/A;  . TEE WITHOUT CARDIOVERSION N/A 12/14/2020   Procedure: TRANSESOPHAGEAL ECHOCARDIOGRAM (TEE);  Surgeon: Nigel Mormon, MD;  Location: [facility];  Service: Cardiovascular;  Laterality: N/A;   Social History   Occupational History  . Occupation: Disability  Tobacco Use  . Smoking status: Current Some Day Smoker    Packs/day: 0.50    Years: 40.00     Pack years: 20.00    Types: Cigarettes  . Smokeless tobacco: Never Used  . Tobacco comment: 3 - 7 a day  Vaping Use  . Vaping Use: Never used  Substance and Sexual Activity  . Alcohol use: Yes    Comment: ocassional since MI   . Drug use: Never  . Sexual activity: Not on file

## 2021-04-04 ENCOUNTER — Other Ambulatory Visit: Payer: Self-pay | Admitting: Cardiology

## 2021-04-04 DIAGNOSIS — I5020 Unspecified systolic (congestive) heart failure: Secondary | ICD-10-CM

## 2021-04-09 ENCOUNTER — Other Ambulatory Visit: Payer: Self-pay | Admitting: Cardiology

## 2021-04-15 ENCOUNTER — Other Ambulatory Visit: Payer: Self-pay | Admitting: Pharmacist

## 2021-04-15 DIAGNOSIS — I739 Peripheral vascular disease, unspecified: Secondary | ICD-10-CM

## 2021-04-15 DIAGNOSIS — I5020 Unspecified systolic (congestive) heart failure: Secondary | ICD-10-CM

## 2021-04-15 DIAGNOSIS — I2510 Atherosclerotic heart disease of native coronary artery without angina pectoris: Secondary | ICD-10-CM

## 2021-04-15 MED ORDER — RIVAROXABAN 2.5 MG PO TABS: 2.5000 mg | ORAL_TABLET | Freq: Two times a day (BID) | ORAL | 3 refills | Status: DC

## 2021-04-15 MED ORDER — FUROSEMIDE 20 MG PO TABS: 20.0000 mg | ORAL_TABLET | Freq: Every day | ORAL | 3 refills | Status: DC

## 2021-04-18 ENCOUNTER — Ambulatory Visit
Admission: RE | Admit: 2021-04-18 | Discharge: 2021-04-18 | Disposition: A | Discharge status: Home / Self Care | Payer: BC Managed Care – PPO | Source: Ambulatory Visit | Attending: Student | Admitting: Student

## 2021-04-18 ENCOUNTER — Other Ambulatory Visit: Payer: Self-pay

## 2021-04-18 DIAGNOSIS — Q2730 Arteriovenous malformation, site unspecified: Secondary | ICD-10-CM

## 2021-04-18 HISTORY — PX: IR RADIOLOGIST EVAL & MGMT: IMG5224

## 2021-04-18 NOTE — Progress Notes (Addendum)
Chief Complaint: Patient was consulted remotely today (TeleHealth) for follow up after occlusion of a pulmonary AV malformation.  History of Present Illness: Mike [name] is a 60 y.o. male status post pulmonary arteriography with transcatheter occlusion of a left lower lobe pulmonary AV malformation/fistula on 03/20/2021.  The procedure went very well and was extremely well-tolerated with ability to occlude the AV malformation with a single microvascular plug deployed in the supplying left lower lobe pulmonary artery branch.  Since treatment, Mike [name] has had no evidence of complication.  He states that he feels better than he has in years.  He denies any respiratory or neurologic symptoms.  He has had no pain following treatment.  Recently, he has started a fitting procedure with [facility] for a left lower extremity prosthesis after prior left above-knee amputation.  He has been released from wound care.  Past Medical History:  Diagnosis Date  . Anxiety   . CAD (coronary artery disease)   . Heart attack ([place]) 08/01/2020  . Heart failure with reduced ejection fraction ([place])   . Hyperlipidemia   . Hypertension   . PAD (peripheral artery disease) ([place])    right CIA & right distal iliac artery stents 10/16/20; left AKA 11/06/20  . PFO (patent foramen ovale) 12/14/2020  . Pneumonia   . Pulmonary AV (arteriovenous) fistula ([place]) 12/14/2020  . Stroke ([place]) 12/11/2020   no residuial effects    Past Surgical History:  Procedure Laterality Date  . ABDOMINAL AORTOGRAM W/LOWER EXTREMITY N/A 10/16/2020   Procedure: ABDOMINAL AORTOGRAM W/LOWER EXTREMITY;  Surgeon: Nigel Mormon, MD;  Location: [facility];  Service: Cardiovascular;  Laterality: N/A;  . AMPUTATION Left 11/06/2020   Procedure: AMPUTATION ABOVE KNEE;  Surgeon: Rosetta Posner, MD;  Location: [facility];  Service: Vascular;  Laterality: Left;  . BUBBLE STUDY  12/14/2020   Procedure: BUBBLE STUDY;  Surgeon:  Nigel Mormon, MD;  Location: [facility];  Service: Cardiovascular;;  . CARDIAC CATHETERIZATION    . CORONARY STENT INTERVENTION N/A 08/03/2020   Procedure: CORONARY STENT INTERVENTION;  Surgeon: Nigel Mormon, MD;  Location: [place] [facility];  Service: Cardiovascular;  Laterality: N/A;  . CORONARY/GRAFT ACUTE MI REVASCULARIZATION N/A 08/01/2020   Procedure: Coronary/Graft Acute MI Revascularization;  Surgeon: Nigel Mormon, MD;  Location: [place] [facility];  Service: Cardiovascular;  Laterality: N/A;  . INTRAVASCULAR LITHOTRIPSY Right 10/16/2020   Procedure: INTRAVASCULAR LITHOTRIPSY;  Surgeon: Nigel Mormon, MD;  Location: [place];  Service: Cardiovascular;  Laterality: Right;  Common and External Iliac  . IR ANGIOGRAM PULMONARY LEFT SELECTIVE  03/20/2021  . IR ANGIOGRAM SELECTIVE EACH ADDITIONAL VESSEL  03/20/2021  . IR EMBO ARTERIAL NOT HEMORR HEMANG INC GUIDE ROADMAPPING  03/20/2021  . IR RADIOLOGIST EVAL & MGMT  01/30/2021  . IR US GUIDE VASC ACCESS RIGHT  03/20/2021  . LEFT HEART CATH AND CORONARY ANGIOGRAPHY N/A 08/01/2020   Procedure: LEFT HEART CATH AND CORONARY ANGIOGRAPHY;  Surgeon: Nigel Mormon, MD;  Location: [facility];  Service: Cardiovascular;  Laterality: N/A;  . LEFT HEART CATH AND CORONARY ANGIOGRAPHY N/A 08/03/2020   Procedure: LEFT HEART CATH AND CORONARY ANGIOGRAPHY;  Surgeon: Nigel Mormon, MD;  Location: [facility];  Service: Cardiovascular;  Laterality: N/A;  . PERIPHERAL VASCULAR INTERVENTION Right 10/16/2020   Procedure: PERIPHERAL VASCULAR INTERVENTION;  Surgeon: Nigel Mormon, MD;  Location: [facility];  Service: Cardiovascular;  Laterality: Right;  Common and Iliac Stent  . RADIOLOGY WITH ANESTHESIA N/A 03/20/2021   Procedure: PULMONARY EMBOLIZATION;  Surgeon: Aletta Edouard, MD;  Location: [facility];  Service: Radiology;  Laterality: N/A;  . TEE WITHOUT CARDIOVERSION N/A 12/14/2020    Procedure: TRANSESOPHAGEAL ECHOCARDIOGRAM (TEE);  Surgeon: Nigel Mormon, MD;  Location: [facility];  Service: Cardiovascular;  Laterality: N/A;    Allergies: Patient has no active allergies.  Medications: Prior to Admission medications   Medication Sig Start Date End Date Taking? Authorizing Provider  acetaminophen (TYLENOL) 500 MG tablet Take 500-1,000 mg by mouth every 6 (six) hours as needed (pain).    [provider]  aspirin EC 81 MG tablet Take 1 tablet (81 mg total) by mouth daily. Swallow whole. Patient taking differently: Take 81 mg by mouth every evening. Swallow whole. 01/08/21   Patwardhan, Reynold Bowen, MD  clopidogrel (PLAVIX) 75 MG tablet TAKE 1 TABLET(75 MG) BY MOUTH DAILY Patient taking differently: Take 75 mg by mouth every evening. 02/07/21   Patwardhan, Reynold Bowen, MD  furosemide (LASIX) 20 MG tablet Take 1 tablet (20 mg total) by mouth daily. 04/15/21   Patwardhan, Reynold Bowen, MD  gabapentin (NEURONTIN) 300 MG capsule Take 1 capsule (300 mg total) by mouth 2 (two) times daily. Patient taking differently: Take 300 mg by mouth in the morning and at bedtime. 11/15/20   Little Ishikawa, MD  metoprolol succinate (TOPROL-XL) 25 MG 24 hr tablet Take 1 tablet (25 mg total) by mouth daily. Take with or immediately following a meal. Patient taking differently: Take 25 mg by mouth every evening. Take with or immediately following a meal. 12/20/20 03/20/21  Patwardhan, Reynold Bowen, MD  Multiple Vitamins-Minerals (MULTI FOR HIM 50+) TABS Take 1 tablet by mouth See admin instructions.    [provider]  nitroGLYCERIN (NITROSTAT) 0.4 MG SL tablet Place 1 tablet (0.4 mg total) under the tongue every 5 (five) minutes as needed for chest pain. Patient taking differently: Place 0.4 mg under the tongue every 5 (five) minutes as needed for chest pain (max 3 doses). 08/14/20 11/12/20  Patwardhan, Reynold Bowen, MD  rivaroxaban (XARELTO) 2.5 MG TABS tablet Take 1 tablet (2.5 mg  total) by mouth 2 (two) times daily. 04/15/21   Patwardhan, Reynold Bowen, MD  rosuvastatin (CRESTOR) 10 MG tablet TAKE 1 TABLET(10 MG) BY MOUTH DAILY Patient taking differently: Take 10 mg by mouth every evening. 02/07/21   Patwardhan, Manish J, MD  sacubitril-valsartan (ENTRESTO) 49-51 MG Take 1 tablet by mouth 2 (two) times daily. 02/20/21   Patwardhan, Reynold Bowen, MD     Family History  Problem Relation Age of Onset  . Heart disease Mother   . Heart disease Father   . Cancer Brother     Social History   Socioeconomic History  . Marital status: Single    Spouse name: Not on file  . Number of children: 1  . Years of education: BA  . Highest education level: Not on file  Occupational History  . Occupation: Disability  Tobacco Use  . Smoking status: Current Some Day Smoker    Packs/day: 0.50    Years: 40.00    Pack years: 20.00    Types: Cigarettes  . Smokeless tobacco: Never Used  . Tobacco comment: 3 - 7 a day  Vaping Use  . Vaping Use: Never used  Substance and Sexual Activity  . Alcohol use: Yes    Comment: ocassional since MI   . Drug use: Never  . Sexual  activity: Not on file  Other Topics Concern  . Not on file  Social History Narrative   Left handed   Caffeine use: 3-4 sodas per day, tea very rare.    Social Determinants of Health   Financial Resource Strain: Not on file  Food Insecurity: Not on file  Transportation Needs: Not on file  Physical Activity: Not on file  Stress: Not on file  Social Connections: Not on file    Review of Systems  Constitutional: Negative.   Respiratory: Negative.   Cardiovascular: Negative.   Gastrointestinal: Negative.   Genitourinary: Negative.   Musculoskeletal: Negative.   Neurological: Negative.     Review of Systems: A 12 point ROS discussed and pertinent positives are indicated in the HPI above.  All other systems are negative.  Physical Exam No direct physical exam was performed (except for noted visual exam findings  with Video Visits).   Vital Signs: There were no vitals taken for this visit.  Imaging: DG Chest 1 View  Result Date: 03/20/2021 CLINICAL DATA:  Pulmonary embolization.  Preop EXAM: CHEST  1 VIEW COMPARISON:  Chest CT 12/14/2020 FINDINGS: Normal heart size and mediastinal contours. Right coronary stenting. No acute infiltrate or edema. No effusion or pneumothorax. No acute osseous findings. IMPRESSION: No active disease. Electronically Signed   By: Monte Fantasia M.D.   On: 03/20/2021 07:12   IR Angiogram Pulmonary Left Selective  Result Date: 03/20/2021 INDICATION: Pulmonary AV malformation/fistula in the medial left lower lobe and history of prior cerebral infarction. The patient presents for embolization/occlusion of the pulmonary vascular malformation. EXAM: 1. ULTRASOUND GUIDANCE FOR VASCULAR ACCESS OF THE RIGHT COMMON FEMORAL VEIN 2. SELECTIVE PULMONARY ARTERIOGRAPHY AT THE LEVEL OF THE LEFT LOWER LOBE PULMONARY ARTERY 3. ADDITIONAL SELECTIVE ARTERIOGRAPHY OF A MEDIAL BASILAR BRANCH OF THE LEFT LOWER LOBE PULMONARY ARTERY 4. TRANSCATHETER EMBOLIZATION OF LEFT LOWER LOBE PULMONARY ARTERY VASCULAR MALFORMATION COMPARISON:  CT a of the chest on 12/14/2020 MEDICATIONS: 2 g IV Ancef, 3000 units intravenous heparin ANESTHESIA/SEDATION: General anesthesia FLUOROSCOPY TIME:  Fluoroscopy Time: 15 minutes and 24 seconds. 209.0 mGy. CONTRAST:  80 mL Omnipaque [id number] COMPLICATIONS: None immediate. TECHNIQUE: Informed written consent was obtained from the patient after a thorough discussion of the procedural risks, benefits and alternatives. All questions were addressed. Maximal Sterile Barrier Technique was utilized including caps, mask, sterile gowns, sterile gloves, sterile drape, hand hygiene and skin antiseptic. A timeout was performed prior to the initiation of the procedure. The patient was placed under general anesthesia. Patency of the right common femoral vein was confirmed by ultrasound. The right  groin was prepped with chlorhexidine. Under direct ultrasound guidance, access of the right common femoral vein was performed with a micropuncture set. Ultrasound image documentation was performed. A 7 French sheath was placed over a wire. A 5 French diagnostic catheter was advanced over a guidewire into the inferior vena cava. The catheter was then further advanced through the right heart and into the left pulmonary artery over a guidewire. Catheter exchange was then performed for a 5 French pigtail catheter. This was advanced into the left lower lobe pulmonary artery and selective pulmonary arteriography performed at the level of the left lower lobe pulmonary artery in 2 different projections. The pigtail catheter was then removed over a guidewire. A 7 French, 80 cm sheath was then advanced over the wire into the left lower lobe pulmonary artery. A 5 French catheter was then used to selectively catheterize a medial basilar branch of the left lower  lobe pulmonary artery. Additional selective arteriography was performed through the catheter. The 5 French catheter was further advanced over a guidewire. A microcatheter was then advanced through the 5 French catheter into a branch of the left lower lobe pulmonary artery. A Medtronic MVP-5Q microvascular plug was then advanced through the microcatheter and into the branch of the left lower lobe pulmonary artery supplying the pulmonary vascular malformation. The microvascular plug was unsheathed at the level of the distal microcatheter and position checked under fluoroscopy. The plug was then detached from its advancement wire. Additional arteriography was then performed through the microcatheter. The microcatheter was removed and additional arteriography performed through the 5 French catheter. After the procedure the sheath was removed and hemostasis obtained with manual compression. FINDINGS: Left lower lobe pulmonary arteriography demonstrates an enlarged and  tortuous supplying arterial branch to the level of a medial basilar vascular malformation essentially representing a tortuous AV fistula with early venous drainage via an enlarged pulmonary vein branch that then drains via the left lower lobe pulmonary vein into the left atrium. Additional selective arteriography at the level of the supplying pulmonary arterial branch also demonstrates the vascular malformation well. Supplying arterial branch measured just under 6 mm in maximum diameter. After advancing a microcatheter into the supplying arterial branch, the pulmonary AV malformation was successfully occluded after deployment of a single microvascular plug. Additional arteriography after plug detachment demonstrates no further flow into the malformation with abrupt cut off of the supplying artery. Peripheral pulmonary arterial branches supplying lung in the region demonstrate patency without evidence of visible pulmonary infarct or vessel occlusions. IMPRESSION: Arteriographic characterization of left lower lobe pulmonary AV malformation/fistula with a supplying medial basilar branch of the left lower lobe pulmonary artery, tortuous course and early venous drainage into an enlarged vein emptying directly into the left lower lobe pulmonary vein. The vascular malformation was successfully occluded by deployment of a microvascular plug in the supplying arterial branch. Arterial supply was completely occluded after vascular plug deployment. Electronically Signed   By: Aletta Edouard M.D.   On: 03/20/2021 13:21   IR Angiogram Selective Each Additional Vessel  Result Date: 03/20/2021 INDICATION: Pulmonary AV malformation/fistula in the medial left lower lobe and history of prior cerebral infarction. The patient presents for embolization/occlusion of the pulmonary vascular malformation. EXAM: 1. ULTRASOUND GUIDANCE FOR VASCULAR ACCESS OF THE RIGHT COMMON FEMORAL VEIN 2. SELECTIVE PULMONARY ARTERIOGRAPHY AT THE LEVEL  OF THE LEFT LOWER LOBE PULMONARY ARTERY 3. ADDITIONAL SELECTIVE ARTERIOGRAPHY OF A MEDIAL BASILAR BRANCH OF THE LEFT LOWER LOBE PULMONARY ARTERY 4. TRANSCATHETER EMBOLIZATION OF LEFT LOWER LOBE PULMONARY ARTERY VASCULAR MALFORMATION COMPARISON:  CT a of the chest on 12/14/2020 MEDICATIONS: 2 g IV Ancef, 3000 units intravenous heparin ANESTHESIA/SEDATION: General anesthesia FLUOROSCOPY TIME:  Fluoroscopy Time: 15 minutes and 24 seconds. 209.0 mGy. CONTRAST:  80 mL Omnipaque [id number] COMPLICATIONS: None immediate. TECHNIQUE: Informed written consent was obtained from the patient after a thorough discussion of the procedural risks, benefits and alternatives. All questions were addressed. Maximal Sterile Barrier Technique was utilized including caps, mask, sterile gowns, sterile gloves, sterile drape, hand hygiene and skin antiseptic. A timeout was performed prior to the initiation of the procedure. The patient was placed under general anesthesia. Patency of the right common femoral vein was confirmed by ultrasound. The right groin was prepped with chlorhexidine. Under direct ultrasound guidance, access of the right common femoral vein was performed with a micropuncture set. Ultrasound image documentation was performed. A 7 French sheath was placed over  a wire. A 5 French diagnostic catheter was advanced over a guidewire into the inferior vena cava. The catheter was then further advanced through the right heart and into the left pulmonary artery over a guidewire. Catheter exchange was then performed for a 5 French pigtail catheter. This was advanced into the left lower lobe pulmonary artery and selective pulmonary arteriography performed at the level of the left lower lobe pulmonary artery in 2 different projections. The pigtail catheter was then removed over a guidewire. A 7 French, 80 cm sheath was then advanced over the wire into the left lower lobe pulmonary artery. A 5 French catheter was then used to selectively  catheterize a medial basilar branch of the left lower lobe pulmonary artery. Additional selective arteriography was performed through the catheter. The 5 French catheter was further advanced over a guidewire. A microcatheter was then advanced through the 5 French catheter into a branch of the left lower lobe pulmonary artery. A Medtronic MVP-5Q microvascular plug was then advanced through the microcatheter and into the branch of the left lower lobe pulmonary artery supplying the pulmonary vascular malformation. The microvascular plug was unsheathed at the level of the distal microcatheter and position checked under fluoroscopy. The plug was then detached from its advancement wire. Additional arteriography was then performed through the microcatheter. The microcatheter was removed and additional arteriography performed through the 5 French catheter. After the procedure the sheath was removed and hemostasis obtained with manual compression. FINDINGS: Left lower lobe pulmonary arteriography demonstrates an enlarged and tortuous supplying arterial branch to the level of a medial basilar vascular malformation essentially representing a tortuous AV fistula with early venous drainage via an enlarged pulmonary vein branch that then drains via the left lower lobe pulmonary vein into the left atrium. Additional selective arteriography at the level of the supplying pulmonary arterial branch also demonstrates the vascular malformation well. Supplying arterial branch measured just under 6 mm in maximum diameter. After advancing a microcatheter into the supplying arterial branch, the pulmonary AV malformation was successfully occluded after deployment of a single microvascular plug. Additional arteriography after plug detachment demonstrates no further flow into the malformation with abrupt cut off of the supplying artery. Peripheral pulmonary arterial branches supplying lung in the region demonstrate patency without evidence of  visible pulmonary infarct or vessel occlusions. IMPRESSION: Arteriographic characterization of left lower lobe pulmonary AV malformation/fistula with a supplying medial basilar branch of the left lower lobe pulmonary artery, tortuous course and early venous drainage into an enlarged vein emptying directly into the left lower lobe pulmonary vein. The vascular malformation was successfully occluded by deployment of a microvascular plug in the supplying arterial branch. Arterial supply was completely occluded after vascular plug deployment. Electronically Signed   By: Aletta Edouard M.D.   On: 03/20/2021 13:21   IR US Guide Vasc Access Right  Result Date: 03/20/2021 INDICATION: Pulmonary AV malformation/fistula in the medial left lower lobe and history of prior cerebral infarction. The patient presents for embolization/occlusion of the pulmonary vascular malformation. EXAM: 1. ULTRASOUND GUIDANCE FOR VASCULAR ACCESS OF THE RIGHT COMMON FEMORAL VEIN 2. SELECTIVE PULMONARY ARTERIOGRAPHY AT THE LEVEL OF THE LEFT LOWER LOBE PULMONARY ARTERY 3. ADDITIONAL SELECTIVE ARTERIOGRAPHY OF A MEDIAL BASILAR BRANCH OF THE LEFT LOWER LOBE PULMONARY ARTERY 4. TRANSCATHETER EMBOLIZATION OF LEFT LOWER LOBE PULMONARY ARTERY VASCULAR MALFORMATION COMPARISON:  CT a of the chest on 12/14/2020 MEDICATIONS: 2 g IV Ancef, 3000 units intravenous heparin ANESTHESIA/SEDATION: General anesthesia FLUOROSCOPY TIME:  Fluoroscopy Time: 15 minutes and  24 seconds. 209.0 mGy. CONTRAST:  80 mL Omnipaque [id number] COMPLICATIONS: None immediate. TECHNIQUE: Informed written consent was obtained from the patient after a thorough discussion of the procedural risks, benefits and alternatives. All questions were addressed. Maximal Sterile Barrier Technique was utilized including caps, mask, sterile gowns, sterile gloves, sterile drape, hand hygiene and skin antiseptic. A timeout was performed prior to the initiation of the procedure. The patient was placed under  general anesthesia. Patency of the right common femoral vein was confirmed by ultrasound. The right groin was prepped with chlorhexidine. Under direct ultrasound guidance, access of the right common femoral vein was performed with a micropuncture set. Ultrasound image documentation was performed. A 7 French sheath was placed over a wire. A 5 French diagnostic catheter was advanced over a guidewire into the inferior vena cava. The catheter was then further advanced through the right heart and into the left pulmonary artery over a guidewire. Catheter exchange was then performed for a 5 French pigtail catheter. This was advanced into the left lower lobe pulmonary artery and selective pulmonary arteriography performed at the level of the left lower lobe pulmonary artery in 2 different projections. The pigtail catheter was then removed over a guidewire. A 7 French, 80 cm sheath was then advanced over the wire into the left lower lobe pulmonary artery. A 5 French catheter was then used to selectively catheterize a medial basilar branch of the left lower lobe pulmonary artery. Additional selective arteriography was performed through the catheter. The 5 French catheter was further advanced over a guidewire. A microcatheter was then advanced through the 5 French catheter into a branch of the left lower lobe pulmonary artery. A Medtronic MVP-5Q microvascular plug was then advanced through the microcatheter and into the branch of the left lower lobe pulmonary artery supplying the pulmonary vascular malformation. The microvascular plug was unsheathed at the level of the distal microcatheter and position checked under fluoroscopy. The plug was then detached from its advancement wire. Additional arteriography was then performed through the microcatheter. The microcatheter was removed and additional arteriography performed through the 5 French catheter. After the procedure the sheath was removed and hemostasis obtained with manual  compression. FINDINGS: Left lower lobe pulmonary arteriography demonstrates an enlarged and tortuous supplying arterial branch to the level of a medial basilar vascular malformation essentially representing a tortuous AV fistula with early venous drainage via an enlarged pulmonary vein branch that then drains via the left lower lobe pulmonary vein into the left atrium. Additional selective arteriography at the level of the supplying pulmonary arterial branch also demonstrates the vascular malformation well. Supplying arterial branch measured just under 6 mm in maximum diameter. After advancing a microcatheter into the supplying arterial branch, the pulmonary AV malformation was successfully occluded after deployment of a single microvascular plug. Additional arteriography after plug detachment demonstrates no further flow into the malformation with abrupt cut off of the supplying artery. Peripheral pulmonary arterial branches supplying lung in the region demonstrate patency without evidence of visible pulmonary infarct or vessel occlusions. IMPRESSION: Arteriographic characterization of left lower lobe pulmonary AV malformation/fistula with a supplying medial basilar branch of the left lower lobe pulmonary artery, tortuous course and early venous drainage into an enlarged vein emptying directly into the left lower lobe pulmonary vein. The vascular malformation was successfully occluded by deployment of a microvascular plug in the supplying arterial branch. Arterial supply was completely occluded after vascular plug deployment. Electronically Signed   By: Aletta Edouard M.D.   On: 03/20/2021  13:21   IR EMBO ARTERIAL NOT HEMORR HEMANG INC GUIDE ROADMAPPING  Result Date: 03/20/2021 INDICATION: Pulmonary AV malformation/fistula in the medial left lower lobe and history of prior cerebral infarction. The patient presents for embolization/occlusion of the pulmonary vascular malformation. EXAM: 1. ULTRASOUND GUIDANCE  FOR VASCULAR ACCESS OF THE RIGHT COMMON FEMORAL VEIN 2. SELECTIVE PULMONARY ARTERIOGRAPHY AT THE LEVEL OF THE LEFT LOWER LOBE PULMONARY ARTERY 3. ADDITIONAL SELECTIVE ARTERIOGRAPHY OF A MEDIAL BASILAR BRANCH OF THE LEFT LOWER LOBE PULMONARY ARTERY 4. TRANSCATHETER EMBOLIZATION OF LEFT LOWER LOBE PULMONARY ARTERY VASCULAR MALFORMATION COMPARISON:  CT a of the chest on 12/14/2020 MEDICATIONS: 2 g IV Ancef, 3000 units intravenous heparin ANESTHESIA/SEDATION: General anesthesia FLUOROSCOPY TIME:  Fluoroscopy Time: 15 minutes and 24 seconds. 209.0 mGy. CONTRAST:  80 mL Omnipaque [id number] COMPLICATIONS: None immediate. TECHNIQUE: Informed written consent was obtained from the patient after a thorough discussion of the procedural risks, benefits and alternatives. All questions were addressed. Maximal Sterile Barrier Technique was utilized including caps, mask, sterile gowns, sterile gloves, sterile drape, hand hygiene and skin antiseptic. A timeout was performed prior to the initiation of the procedure. The patient was placed under general anesthesia. Patency of the right common femoral vein was confirmed by ultrasound. The right groin was prepped with chlorhexidine. Under direct ultrasound guidance, access of the right common femoral vein was performed with a micropuncture set. Ultrasound image documentation was performed. A 7 French sheath was placed over a wire. A 5 French diagnostic catheter was advanced over a guidewire into the inferior vena cava. The catheter was then further advanced through the right heart and into the left pulmonary artery over a guidewire. Catheter exchange was then performed for a 5 French pigtail catheter. This was advanced into the left lower lobe pulmonary artery and selective pulmonary arteriography performed at the level of the left lower lobe pulmonary artery in 2 different projections. The pigtail catheter was then removed over a guidewire. A 7 French, 80 cm sheath was then advanced over the  wire into the left lower lobe pulmonary artery. A 5 French catheter was then used to selectively catheterize a medial basilar branch of the left lower lobe pulmonary artery. Additional selective arteriography was performed through the catheter. The 5 French catheter was further advanced over a guidewire. A microcatheter was then advanced through the 5 French catheter into a branch of the left lower lobe pulmonary artery. A Medtronic MVP-5Q microvascular plug was then advanced through the microcatheter and into the branch of the left lower lobe pulmonary artery supplying the pulmonary vascular malformation. The microvascular plug was unsheathed at the level of the distal microcatheter and position checked under fluoroscopy. The plug was then detached from its advancement wire. Additional arteriography was then performed through the microcatheter. The microcatheter was removed and additional arteriography performed through the 5 French catheter. After the procedure the sheath was removed and hemostasis obtained with manual compression. FINDINGS: Left lower lobe pulmonary arteriography demonstrates an enlarged and tortuous supplying arterial branch to the level of a medial basilar vascular malformation essentially representing a tortuous AV fistula with early venous drainage via an enlarged pulmonary vein branch that then drains via the left lower lobe pulmonary vein into the left atrium. Additional selective arteriography at the level of the supplying pulmonary arterial branch also demonstrates the vascular malformation well. Supplying arterial branch measured just under 6 mm in maximum diameter. After advancing a microcatheter into the supplying arterial branch, the pulmonary AV malformation was successfully occluded after deployment of  a single microvascular plug. Additional arteriography after plug detachment demonstrates no further flow into the malformation with abrupt cut off of the supplying artery. Peripheral  pulmonary arterial branches supplying lung in the region demonstrate patency without evidence of visible pulmonary infarct or vessel occlusions. IMPRESSION: Arteriographic characterization of left lower lobe pulmonary AV malformation/fistula with a supplying medial basilar branch of the left lower lobe pulmonary artery, tortuous course and early venous drainage into an enlarged vein emptying directly into the left lower lobe pulmonary vein. The vascular malformation was successfully occluded by deployment of a microvascular plug in the supplying arterial branch. Arterial supply was completely occluded after vascular plug deployment. Electronically Signed   By: Aletta Edouard M.D.   On: 03/20/2021 13:21    Labs:  CBC: Recent Labs    12/11/20 1020 12/11/20 1027 12/12/20 0130 12/14/20 0450 03/20/21 0649  WBC 7.7  --  8.2 7.5 8.3  HGB 12.0* 12.6* 11.0* 11.6* 13.5  HCT 40.3 37.0* 36.3* 35.5* 42.1  PLT 221  --  214 202 212    COAGS: Recent Labs    10/30/20 1822 12/11/20 1020 03/20/21 0649  INR 1.2 1.0 1.0  APTT 31 29  --     BMP: Recent Labs    09/25/20 0903 10/17/20 0825 10/22/20 1054 10/26/20 1004 10/30/20 1652 12/13/20 0129 12/14/20 0450 01/31/21 1304 03/20/21 0649  NA 140   < > CANCELED 135   < > 141 140 145* 139  K 4.5   < > CANCELED 3.9   < > 3.7 3.9 3.8 3.8  CL 108*   < > CANCELED 98   < > 110 110 108* 109  CO2 18*   < > CANCELED 11*   < > 21* 22 20 23   GLUCOSE 102*   < > 135* 210*   < > 96 100* 118* 85  BUN 15   < > 67* 79*   < > 11 13 22 19   CALCIUM 9.4   < > CANCELED 9.1   < > 8.9 8.6* 8.9 9.0  CREATININE 1.16   < > 6.34* 5.36*   < > 1.11 1.09 1.00 1.05  GFRNONAA 68   < > 9* 11*   < > >60 >60 81 >60  GFRAA 78  --  10* 12*  --   --   --  93  --    < > = values in this interval not displayed.    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  He has no symptoms and no signs of complication following the procedure.  I have recommended a follow-up CTA of the chest for approximately 3 months after the procedure in late June of this year.  I will follow-up with him to review and discuss CTA results.  He will be having a follow up transcranial Doppler bubble study with Dr. Leonie Man soon to determine if his PFO is causing any significant shunting and also has followed up with Dr. Virgina Jock who is planning loop recorder placement to monitor for paroxysmal a-fib.   Electronically Signed: Eulas Post  T Montrel Donahoe 04/18/2021, 9:58 AM     I spent a total of 10 Minutes in remote  clinical consultation, greater than 50% of which was counseling/coordinating care post occlusion of a pulmonary AV malformation.    Visit type: Audio only (telephone). Audio (no video) only due to patient's lack of internet/smartphone capability. Alternative for in-person consultation at [facility], [address] [place], [place], [place]. This visit type was conducted due to national recommendations for restrictions regarding the COVID-19 Pandemic (e.g. social distancing).  This format is felt to be most appropriate for this patient at this time.  All issues noted in this document were discussed and addressed.

## 2021-04-24 ENCOUNTER — Ambulatory Visit: Payer: BC Managed Care – PPO | Admitting: Podiatry

## 2021-04-25 ENCOUNTER — Other Ambulatory Visit: Payer: Self-pay

## 2021-04-25 ENCOUNTER — Ambulatory Visit (HOSPITAL_COMMUNITY)
Admission: RE | Admit: 2021-04-25 | Discharge: 2021-04-25 | Disposition: A | Discharge status: Home / Self Care | Payer: BC Managed Care – PPO | Source: Ambulatory Visit | Attending: Neurology | Admitting: Neurology

## 2021-04-25 DIAGNOSIS — I639 Cerebral infarction, unspecified: Secondary | ICD-10-CM | POA: Diagnosis not present

## 2021-04-25 NOTE — Progress Notes (Signed)
TCD bubble study completed with Dr. Leonie Man. Refer to "CV Proc" under chart review to view preliminary results.  04/25/2021 2:10 PM Kelby Aline., MHA, RVT, RDCS, RDMS

## 2021-05-06 ENCOUNTER — Telehealth: Payer: Self-pay

## 2021-05-06 NOTE — Telephone Encounter (Signed)
I think it would be best to check with his PCP or neurologist.  Thanks MJP

## 2021-05-06 NOTE — Telephone Encounter (Signed)
Telephone encounter:  Reason for call: Patient called and asked if you could refill his Gabapentin, also asked about his upcoming Loop 05/10/21, he was told he cant drive after and is having trouble finding a ride, can he drive after?   Usual provider: [facility]  Last office visit: 04/01/21  Next office visit:    Last hospitalization:   Current Outpatient Medications on File Prior to Visit  Medication Sig Dispense Refill  . acetaminophen (TYLENOL) 500 MG tablet Take 500-1,000 mg by mouth every 6 (six) hours as needed (pain).    Marland Kitchen aspirin EC 81 MG tablet Take 1 tablet (81 mg total) by mouth daily. Swallow whole. (Patient taking differently: Take 81 mg by mouth every evening. Swallow whole.) 90 tablet 0  . clopidogrel (PLAVIX) 75 MG tablet TAKE 1 TABLET(75 MG) BY MOUTH DAILY (Patient taking differently: Take 75 mg by mouth every evening.) 30 tablet 6  . furosemide (LASIX) 20 MG tablet Take 1 tablet (20 mg total) by mouth daily. 30 tablet 3  . gabapentin (NEURONTIN) 300 MG capsule Take 1 capsule (300 mg total) by mouth 2 (two) times daily. (Patient taking differently: Take 300 mg by mouth in the morning and at bedtime.) 60 capsule 0  . metoprolol succinate (TOPROL-XL) 25 MG 24 hr tablet Take 1 tablet (25 mg total) by mouth daily. Take with or immediately following a meal. (Patient taking differently: Take 25 mg by mouth every evening. Take with or immediately following a meal.) 90 tablet 3  . Multiple Vitamins-Minerals (MULTI FOR HIM 50+) TABS Take 1 tablet by mouth See admin instructions.    . nitroGLYCERIN (NITROSTAT) 0.4 MG SL tablet Place 1 tablet (0.4 mg total) under the tongue every 5 (five) minutes as needed for chest pain. (Patient taking differently: Place 0.4 mg under the tongue every 5 (five) minutes as needed for chest pain (max 3 doses).) 30 tablet 1  . rivaroxaban (XARELTO) 2.5 MG TABS tablet Take 1 tablet (2.5 mg total) by mouth 2 (two) times daily. 60 tablet 3  . rosuvastatin (CRESTOR) 10  MG tablet TAKE 1 TABLET(10 MG) BY MOUTH DAILY (Patient taking differently: Take 10 mg by mouth every evening.) 30 tablet 0  . sacubitril-valsartan (ENTRESTO) 49-51 MG Take 1 tablet by mouth 2 (two) times daily. 60 tablet 2   No current facility-administered medications on file prior to visit.

## 2021-05-10 ENCOUNTER — Ambulatory Visit: Payer: BC Managed Care – PPO | Admitting: Cardiology

## 2021-05-10 ENCOUNTER — Telehealth: Payer: Self-pay | Admitting: Cardiology

## 2021-05-10 ENCOUNTER — Other Ambulatory Visit: Payer: Self-pay

## 2021-05-10 ENCOUNTER — Encounter: Payer: Self-pay | Admitting: Cardiology

## 2021-05-10 VITALS — BP 110/60 | HR 69 | Temp 98.0°F | Resp 18 | Ht 72.0 in | Wt 156.0 lb

## 2021-05-10 DIAGNOSIS — I639 Cerebral infarction, unspecified: Secondary | ICD-10-CM

## 2021-05-10 DIAGNOSIS — Z95818 Presence of other cardiac implants and grafts: Secondary | ICD-10-CM

## 2021-05-10 HISTORY — DX: Presence of other cardiac implants and grafts: Z95.818

## 2021-05-10 HISTORY — DX: Cerebral infarction, unspecified: I63.9

## 2021-05-10 MED ORDER — AMOXICILLIN-POT CLAVULANATE 500-125 MG PO TABS: 1.0000 | ORAL_TABLET | Freq: Three times a day (TID) | ORAL | 0 refills | Status: DC

## 2021-05-10 NOTE — Telephone Encounter (Signed)
Patient called [place] stating that he is bleeding from the loop recorder site that was implanted this morning.  On exam he was continuously using significant amount of blood, he had soaked part of his shirt and the dressing.  The dressing was removed and a aseptic fashion, I had placed Steri-Strips, these were removed as well and any minor hematoma was extracted and sutures placed with complete hemostasis.  Patient will be started on prophylactic antibiotics.  We will see him back in the office in 1 week for wound check.     ICD-10-CM   1. Loop Biotronic 61254832 05/10/2021  Z95.818 amoxicillin-clavulanate (AUGMENTIN) 500-125 MG tablet     Adrian Prows, MD, [facility] 05/10/2021, 3:09 PM Office: [phone] Fax: [phone] Pager: [phone]

## 2021-05-10 NOTE — Progress Notes (Signed)
Chief Complaint  Patient presents with  . Atrial Fibrillation    Loop implant    Today's Vitals   05/10/21 0829  BP: 110/60  Pulse: 69  Resp: 18  Temp: 98 F (36.7 C)  SpO2: 98%  Weight: 156 lb (70.8 kg)  Height: 6' (1.829 m)   Body mass index is 21.16 kg/m.   Physical exam: Well-built, well nourished, no acute distress. Exam normal S1 and S2. Chest clear to auscultate. Abdomen soft bowel sounds heard.  Procedure explained to the patient, all questions answered.    Prodedure performed: Insertion of Biotronik Loop Recorder. Serial #[id number]  Indication: Cryptogenic stroke  Blood pressure 110/60, pulse 69, temperature 98 F (36.7 C), resp. rate 18, height 6' (1.829 m), weight 156 lb (70.8 kg), SpO2 98 %.   After obtaining informed consent, explaining the procedure to the patient, under sterile precautions, local anesthesia with 1% lidocaine with epinephrine was injected subcutaneously in the left parasternal region.  20 mL utilized.  A small nick was made in the left paraspinal region at the 3rd intercostal space. Biotronik loop recorder was inserted without any complications.  Patient tolerated the procedure well.  The incision was closed with Steri-Strips.  There is no blood loss, no hematoma.  Written instrtuctions regarding wound care given to the patient.  Device setting:  R wave amplitude 0.39mV.  Programmed to AF detection to High.  HVR 160/min. Bradycardia 30 BPM Asystole 4 Sec    ICD-10-CM   1. Cryptogenic stroke (HCC)  I63.9   2. Loop Biotronic 11031594 05/10/2021  Z95.818        Adrian Prows, MD, [facility] 05/10/2021, 9:36 AM Office: [phone] Pager: [phone]

## 2021-05-10 NOTE — Patient Instructions (Signed)
Implantable Loop Recorder Placement, Care After This sheet gives you information about how to care for yourself after your procedure. Your health care provider may also give you more specific instructions. If you have problems or questions, contact your health care provider. What can I expect after the procedure? After the procedure, it is common to have:  Soreness or discomfort near the incision.  Some swelling or bruising near the incision. Follow these instructions at home: Incision care  Follow instructions from your health care provider about how to take care of your incision. Make sure you: ? Wash your hands with soap and water before you change your bandage (dressing). If soap and water are not available, use hand sanitizer. ? Change your dressing as told by your health care provider. ? Keep your dressing dry. ? Leave stitches (sutures), skin glue, or adhesive strips in place. These skin closures may need to stay in place for 2 weeks or longer. If adhesive strip edges start to loosen and curl up, you may trim the loose edges. Do not remove adhesive strips completely unless your health care provider tells you to do that.  Check your incision area every day for signs of infection. Check for: ? Redness, swelling, or pain. ? Fluid or blood. ? Warmth. ? Pus or a bad smell.  Do not take baths, swim, or use a hot tub until your health care provider approves. Ask your health care provider if you can take showers.   Activity  Return to your normal activities as told by your health care provider. Ask your health care provider what activities are safe for you.  Do not drive for 24 hours if you were given a sedative during your procedure.   General instructions  Follow instructions from your health care provider about how to manage your implantable loop recorder and transmit the information. Learn how to activate a recording if this is necessary for your type of device.  Do not go through  a metal detection gate, and do not let someone hold a metal detector over your chest. Show your ID card.  Do not have an MRI unless you check with your health care provider first.  Take over-the-counter and prescription medicines only as told by your health care provider.  Keep all follow-up visits as told by your health care provider. This is important. Contact a health care provider if:  You have redness, swelling, or pain around your incision.  You have a fever.  You have pain that is not relieved by your pain medicine.  You have triggered your device because of fainting (syncope) or because of a heartbeat that feels like it is racing, slow, fluttering, or skipping (palpitations). Get help right away if you have:  Chest pain.  Difficulty breathing. Summary  After the procedure, it is common to have soreness or discomfort near the incision.  Change your dressing as told by your health care provider.  Follow instructions from your health care provider about how to manage your implantable loop recorder and transmit the information.  Keep all follow-up visits as told by your health care provider. This is important. This information is not intended to replace advice given to you by your health care provider. Make sure you discuss any questions you have with your health care provider. Document Revised: 01/30/2018 Document Reviewed: 01/30/2018 Elsevier Patient Education  2021 Elsevier Inc. 

## 2021-05-10 NOTE — Telephone Encounter (Signed)
Patient aware of results and informed.

## 2021-05-10 NOTE — Telephone Encounter (Signed)
Patient also advised to hold Xarelto 2.5 mg twice daily and also Plavix for the next 3 days over the weekend.

## 2021-05-11 ENCOUNTER — Telehealth: Payer: Self-pay | Admitting: Cardiology

## 2021-05-11 NOTE — Telephone Encounter (Signed)
I called the patient this afternoon, states that there is no further bleeding or oozing.  Wound is healing well.  No significant pain except when the gets up or moves around feels mild tenderness which he states that he may be able to take Tylenol.  No fever or chills or discharge.   Adrian Prows, MD, [facility] 05/11/2021, 1:40 PM Office: [phone] Fax: [phone] Pager: [phone]

## 2021-05-15 ENCOUNTER — Other Ambulatory Visit: Payer: Self-pay | Admitting: Cardiology

## 2021-05-15 ENCOUNTER — Other Ambulatory Visit: Payer: Self-pay

## 2021-05-15 DIAGNOSIS — Z89612 Acquired absence of left leg above knee: Secondary | ICD-10-CM

## 2021-05-15 DIAGNOSIS — I5020 Unspecified systolic (congestive) heart failure: Secondary | ICD-10-CM

## 2021-05-20 ENCOUNTER — Telehealth: Payer: Self-pay | Admitting: Cardiology

## 2021-05-20 NOTE — Telephone Encounter (Signed)
Noted to have asymptomatic sinus pauses previously during hospitalizations as well. Will continue to monitor.  Thanks MJP

## 2021-05-20 NOTE — Progress Notes (Signed)
Patient presents for 1 week follow after loop recorder implantation on 05/10/2021.  In the interim our office received and scheduled for from patient's pacemaker as below:  Unscheduled (Alert) 05/20/2021 at 1:50 AM: 4.2 seconds of Asystole. No symptoms reported.   Patient has known history of asymptomatic sinus pauses during hospitalization and as he continues to remain asymptomatic, recommend continued monitoring.    He remains asymptomatic and did not have any further wound issues.  I have discussed with him regarding sinus pause that he had 2 nights ago, patient essentially asymptomatic.  I also advised him that he continues to have recurrent episodes of sinus pause, we could consider sleep study.   Patient here for suture removal. Patient underwent loop recorder implantation 05/10/2021 - 11 days ago, which required closure with 2 sutures. Patient denies pain, redness, or drainage from the wound. The area was cleaned, suture was removed using aseptic technique, and implantation site was cleaned again. Informed patient to monitor the area for signs of infection and to let the office know if these develop, they expressed understanding. Patient denies pain, redness, or drainage from the wound. The area was cleaned, suture was removed using aseptic technique, and implantation site was cleaned again. Informed patient to monitor the area for signs of infection and to let the office know if these develop, they expressed understanding.    ICD-10-CM   1. Loop Biotronic 84720721 05/10/2021  Z95.818   2. Cryptogenic stroke [facility])  I63.9       Adrian Prows, MD, [facility] 05/21/2021, 9:12 AM Office: [phone] Fax: [phone] Pager: [phone]

## 2021-05-21 ENCOUNTER — Encounter: Payer: Self-pay | Admitting: Student

## 2021-05-21 ENCOUNTER — Ambulatory Visit: Payer: Self-pay | Admitting: Student

## 2021-05-21 ENCOUNTER — Other Ambulatory Visit: Payer: Self-pay

## 2021-05-21 VITALS — BP 135/70 | HR 80 | Temp 98.8°F | Resp 16 | Ht 72.0 in | Wt 157.0 lb

## 2021-05-21 DIAGNOSIS — I639 Cerebral infarction, unspecified: Secondary | ICD-10-CM

## 2021-05-21 DIAGNOSIS — Z95818 Presence of other cardiac implants and grafts: Secondary | ICD-10-CM

## 2021-05-21 DIAGNOSIS — Z89612 Acquired absence of left leg above knee: Secondary | ICD-10-CM | POA: Diagnosis not present

## 2021-05-22 ENCOUNTER — Ambulatory Visit (INDEPENDENT_AMBULATORY_CARE_PROVIDER_SITE_OTHER): Payer: BC Managed Care – PPO | Admitting: Physical Therapy

## 2021-05-22 ENCOUNTER — Encounter: Payer: Self-pay | Admitting: Physical Therapy

## 2021-05-22 DIAGNOSIS — R2689 Other abnormalities of gait and mobility: Secondary | ICD-10-CM | POA: Diagnosis not present

## 2021-05-22 DIAGNOSIS — R2681 Unsteadiness on feet: Secondary | ICD-10-CM

## 2021-05-22 DIAGNOSIS — M6281 Muscle weakness (generalized): Secondary | ICD-10-CM | POA: Diagnosis not present

## 2021-05-22 DIAGNOSIS — R293 Abnormal posture: Secondary | ICD-10-CM

## 2021-05-22 DIAGNOSIS — Z9181 History of falling: Secondary | ICD-10-CM

## 2021-05-22 DIAGNOSIS — M25652 Stiffness of left hip, not elsewhere classified: Secondary | ICD-10-CM

## 2021-05-22 NOTE — Therapy (Signed)
[facility] Physical Therapy [address] [place], [place], [address] Phone: [phone]   Fax:  [phone]  Physical Therapy Evaluation  Patient Details  Name: Mike [name] MRN: [id number] Date of Birth: December 15, 1961 Referring Provider (PT): Meridee Score, MD   Encounter Date: 05/22/2021   PT End of Session - 05/22/21 1554    Visit Number 1    Number of Visits 21    Date for PT Re-Evaluation 08/09/21    Authorization Type BCBS    Authorization Time Period OOP & Deductible MET    PT Start Time 0930    PT Stop Time 1015    PT Time Calculation (min) 45 min    Equipment Utilized During Treatment Gait belt    Activity Tolerance Patient tolerated treatment well    Behavior During Therapy [facility] for tasks assessed/performed           Past Medical History:  Diagnosis Date  . Anxiety   . CAD (coronary artery disease)   . Cryptogenic stroke ([place]) 05/10/2021  . Heart attack ([place]) 08/01/2020  . Heart failure with reduced ejection fraction ([place])   . Hyperlipidemia   . Hypertension   . Loop Biotronic [id number] 05/10/2021 05/10/2021  . PAD (peripheral artery disease) ([place])    right CIA & right distal iliac artery stents 10/16/20; left AKA 11/06/20  . PFO (patent foramen ovale) 12/14/2020  . Pneumonia   . Pulmonary AV (arteriovenous) fistula ([place]) 12/14/2020  . Stroke ([place]) 12/11/2020   no residuial effects    Past Surgical History:  Procedure Laterality Date  . ABDOMINAL AORTOGRAM W/LOWER EXTREMITY N/A 10/16/2020   Procedure: ABDOMINAL AORTOGRAM W/LOWER EXTREMITY;  Surgeon: Nigel Mormon, MD;  Location: [place] [facility];  Service: Cardiovascular;  Laterality: N/A;  . AMPUTATION Left 11/06/2020   Procedure: AMPUTATION ABOVE KNEE;  Surgeon: Rosetta Posner, MD;  Location: [place];  Service: Vascular;  Laterality: Left;  . BUBBLE STUDY  12/14/2020   Procedure: BUBBLE STUDY;  Surgeon: Nigel Mormon, MD;  Location: [facility];  Service: Cardiovascular;;   . CARDIAC CATHETERIZATION    . CORONARY STENT INTERVENTION N/A 08/03/2020   Procedure: CORONARY STENT INTERVENTION;  Surgeon: Nigel Mormon, MD;  Location: [place] [facility];  Service: Cardiovascular;  Laterality: N/A;  . CORONARY/GRAFT ACUTE MI REVASCULARIZATION N/A 08/01/2020   Procedure: Coronary/Graft Acute MI Revascularization;  Surgeon: Nigel Mormon, MD;  Location: [place] [facility];  Service: Cardiovascular;  Laterality: N/A;  . INTRAVASCULAR LITHOTRIPSY Right 10/16/2020   Procedure: INTRAVASCULAR LITHOTRIPSY;  Surgeon: Nigel Mormon, MD;  Location: [place] [facility];  Service: Cardiovascular;  Laterality: Right;  Common and External Iliac  . IR ANGIOGRAM PULMONARY LEFT SELECTIVE  03/20/2021  . IR ANGIOGRAM SELECTIVE EACH ADDITIONAL VESSEL  03/20/2021  . IR EMBO ARTERIAL NOT HEMORR HEMANG INC GUIDE ROADMAPPING  03/20/2021  . IR RADIOLOGIST EVAL & MGMT  01/30/2021  . IR RADIOLOGIST EVAL & MGMT  04/18/2021  . IR US GUIDE VASC ACCESS RIGHT  03/20/2021  . LEFT HEART CATH AND CORONARY ANGIOGRAPHY N/A 08/01/2020   Procedure: LEFT HEART CATH AND CORONARY ANGIOGRAPHY;  Surgeon: Nigel Mormon, MD;  Location: [place] [facility];  Service: Cardiovascular;  Laterality: N/A;  . LEFT HEART CATH AND CORONARY ANGIOGRAPHY N/A 08/03/2020   Procedure: LEFT HEART CATH AND CORONARY ANGIOGRAPHY;  Surgeon: Nigel Mormon, MD;  Location: [place] [facility];  Service: Cardiovascular;  Laterality: N/A;  . PERIPHERAL VASCULAR INTERVENTION Right 10/16/2020  Procedure: PERIPHERAL VASCULAR INTERVENTION;  Surgeon: Nigel Mormon, MD;  Location: [place] [facility];  Service: Cardiovascular;  Laterality: Right;  Common and Iliac Stent  . RADIOLOGY WITH ANESTHESIA N/A 03/20/2021   Procedure: PULMONARY EMBOLIZATION;  Surgeon: Aletta Edouard, MD;  Location: [place];  Service: Radiology;  Laterality: N/A;  . TEE WITHOUT CARDIOVERSION N/A 12/14/2020   Procedure: TRANSESOPHAGEAL ECHOCARDIOGRAM  (TEE);  Surgeon: Nigel Mormon, MD;  Location: [facility];  Service: Cardiovascular;  Laterality: N/A;    There were no vitals filed for this visit.    Subjective Assessment - 05/22/21 0936    Subjective This 60yo male was referred to PT by Meridee Score, MD with [phone] (ICD-10-CM) - Left above-knee amputee performed on 11/06/2020. He had multifocal stroke Dec 2021 with AV malformation. STEMI 08/17/2020. He received prosthesis on 05/21/2021    Pertinent History PAD, gangrene s/p L AKA 11/06/20, HTN, CAD s/p STEMI with stenting 08/17/2020, and CHF. cryptogenic stroke Dec 2021    Patient Stated Goals to return to work, (he was released from current job over last weekend but wants to find job).  to walk in community,    Currently in Pain? No/denies              [facility] PT Assessment - 05/22/21 0930      Assessment   Medical Diagnosis Left Transfemoral Amputation    Referring Provider (PT) Meridee Score, MD    Onset Date/Surgical Date 05/21/21   Prosthesis delivery   Hand Dominance Left    Prior Therapy rehab center after amputation, HHPT 2 visits      Precautions   Precautions Fall      Balance Screen   Has the patient fallen in the past 6 months Yes    How many times? 3   off balance, denies injuries   Has the patient had a decrease in activity level because of a fear of falling?  No    Is the patient reluctant to leave their home because of a fear of falling?  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#,    Leisure read,      Posture/Postural Control   Posture/Postural Control Postural limitations    Postural Limitations Rounded Shoulders;Forward head;Weight shift right      ROM / Strength   AROM / PROM / Strength AROM;Strength      AROM   Overall AROM Comments hip extension in standing -10*      Strength   Overall Strength Deficits    Strength Assessment Site Hip;Knee;Ankle    Right Hip Flexion 5/5    Right Hip Extension 4/5   gross testing seated   Right Hip ABduction 4+/5   gross testing seated   Left Hip Flexion 4/5    Left Hip Extension 3+/5   gross testing seated   Left Hip  ABduction 4-/5   gross testing seated   Right/Left Knee Right    Right Knee Flexion 4/5   gross testing seated   Right Knee Extension 5/5    Right Ankle Dorsiflexion 5/5      Transfers   Transfers Sit to Stand;Stand to Sit    Sit to Stand 5: Supervision;With upper extremity assist;With armrests;From chair/3-in-1;Other (comment)   requires touch on RW to stabilize, minimal engagement of microprocessor knee   Stand to Sit 5: Supervision;With upper extremity assist;With armrests;To chair/3-in-1;Other (comment)   requires touch on RW to stabilize, minimal engagement of microprocessor knee     Ambulation/Gait   Ambulation/Gait Yes    Ambulation/Gait Assistance 5: Supervision   with RW   Ambulation/Gait Assistance Details excessive UE  weight bearing on RW,  vaulting to clear prosthesis, prosthetic knee not extended at initial contact or stance.    Ambulation Distance (Feet) 75 Feet    Assistive device Rolling walker;Prosthesis    Gait Pattern Step-through pattern;Decreased step length - right;Decreased stance time - left;Decreased hip/knee flexion - left;Decreased weight shift to left;Left hip hike;Left flexed knee in stance;Antalgic;Lateral hip instability;Trunk flexed;Abducted -  left;Poor foot clearance - left    Ambulation Surface Level;Indoor    Gait velocity 0.93 ft/sec      Standardized Balance Assessment   Standardized Balance Assessment Berg Balance Test      Berg Balance Test   Sit to Stand Needs minimal aid to stand or to stabilize    Standing Unsupported Able to stand 2 minutes with supervision    Sitting with Back Unsupported but Feet Supported on Floor or Stool Able to sit safely and securely 2 minutes    Stand to Sit Controls descent by using hands    Transfers Able to transfer safely, minor use of hands    Standing Unsupported with Eyes Closed Able to stand 10 seconds with supervision    Standing Unsupported with Feet Together Needs help to attain position but able to stand for 30 seconds with feet together    From Standing, Reach Forward with Outstretched Arm Reaches forward but needs supervision    From Standing Position, Pick up Object from Floor Unable to pick up and needs supervision    From Standing Position, Turn to Look Behind Over each Shoulder Needs supervision when turning    Turn 360 Degrees Needs assistance while turning    Standing Unsupported, Alternately Place Feet on Step/Stool Needs assistance to keep from falling or unable to try    Standing Unsupported, One Foot in Front Needs help to step but can hold 15 seconds    Standing on One Leg Tries to lift leg/unable to hold 3 seconds but remains standing independently    Total Score 24    Berg comment: < 36 high risk for falls (close to 100%) 46-51 moderate (>50%)   37-45 significant (>80%) 52-55 lower (> 25%)           Prosthetics Assessment - 05/22/21 0930      Prosthetics   Prosthetic Care Dependent with Skin check;Residual limb care;Prosthetic cleaning;Ply sock cleaning;Correct ply sock adjustment;Proper wear schedule/adjustment;Proper weight-bearing schedule/adjustment    Donning prosthesis  Supervision    Doffing prosthesis  Supervision    Current prosthetic wear tolerance  (days/week)  1 of 1 day since delivery    Current prosthetic wear tolerance (#hours/day)  2 hrs    Current prosthetic weight-bearing tolerance (hours/day)  Pt tolerated standing with partial weight on prosthesis without c/o limb  pain or discomfort.    Edema pitting    Residual limb condition  slight heat rash proximally, no open areas, cylinderical, normal color & temperature,    Prosthesis Description silicon liner with velcro lanyard suspension, ischial containment socket, microprocessor knee / foot system.                     Objective measurements completed on examination: See above findings.       [place] Adult PT Treatment/Exercise - 05/22/21 0930      Prosthetics   Prosthetic Care Comments  PT instructed with manual & verbal cues on donning to fully tighten suspension strap & correct rotation.  PT recommended wearing 3hrs 2x/day and increasing twice per week (on Wed & Sun) by 1 hr/wear.    Education Provided Skin check;Residual limb care;Prosthetic cleaning;Proper Donning;Proper wear schedule/adjustment;Other (comment)   see prosthetic care comments   Person(s) Educated Patient    Education Method Explanation;Demonstration;Tactile cues;Verbal cues    Education Method Verbalized understanding;Tactile cues required;Verbal cues required;Needs further instruction                    PT Short Term Goals - 05/22/21 1610      PT SHORT TERM GOAL #1   Title Patient donnes prosthesis modified independent & verbalizes proper cleaning.    Time 1    Period Months    Status New    Target Date 06/28/21      PT SHORT TERM GOAL #2   Title Patient tolerates prosthesis >12 hrs total /day without skin issues or limb pain    Time 1    Period Months    Status New    Target Date 06/28/21      PT SHORT TERM GOAL #3   Title Patient able to pick up items from floor & reach 10" without UE support safely.    Time 1    Period Months    Status New    Target Date 06/28/21       PT SHORT TERM GOAL #4   Title Patient ambulates 200' with RW & prosthesis with supervision with proper prosthetic knee control.    Time 1    Period Months    Status New    Target Date 06/28/21      PT SHORT TERM GOAL #5   Title Patient negotiates ramps & curbs with RW & prosthesis with supervision.    Time 1    Period Months    Status New    Target Date 06/28/21             PT Long Term Goals - 05/22/21 1606      PT LONG TERM GOAL #1   Title Patient demonstrates & verbalized understanding of prosthetic care to enable safe utilization of prosthesis.    Time 10    Period Weeks    Status New    Target Date 08/09/21      PT LONG TERM GOAL #2   Title Patient tolerates prosthesis wear >90% of awake hours without skin or limb pain issues.    Time 10    Period Weeks    Status New    Target Date 08/09/21      PT LONG TERM GOAL #3   Title Berg Balance >/= 45/56 to indicate lower fall risk    Time 10    Period Weeks    Status New    Target Date 08/09/21  PT LONG TERM GOAL #4   Title Patient ambulates 500' outdoors with LRAD & prosthesis modified independent    Time 10    Period Weeks    Status New    Target Date 08/09/21      PT LONG TERM GOAL #5   Title Patient negotiates ramps, curbs & stairs with LRAD & prosthesis modified independent.    Time 10    Period Weeks    Status New    Target Date 08/09/21      Additional Long Term Goals   Additional Long Term Goals Yes      PT LONG TERM GOAL #6   Title Patient demo proper lifting, carrying up to 30#, push/pull task and climbing ladder for return to work skills.    Time 10    Period Weeks    Status New    Target Date 08/09/21                  Plan - 05/22/21 1600    Clinical Impression Statement This 60yo male underwent a Left Transfemoral Amputation on 11/16/2020. He also suffered STEMI 08/17/2020 & cryptogenic stroke 12/25/2020. He has weakness & significant deconditioning with prolonged limited  mobility. He received his first prosthesis on 05/21/2021 and is dependent in prosthetic care which increases the risk of skin issues.  He has limited wear of 2 hrs which impairs function throughout his day.  Berg Balance of 24/56 indicates high fall risk.  His prosthetic gait has significant deviations including excessive UE weight bearing on walker and knee instability.  Patient would benefit from skilled PT intervention to improve function & safety with his prosthesis.    Personal Factors and Comorbidities Comorbidity 3+;Fitness;Time since onset of injury/illness/exacerbation    Comorbidities PAD, gangrene s/p L AKA 11/06/20, HTN, CAD s/p STEMI with stenting 08/17/2020, and CHF. cryptogenic stroke Dec 2021    Examination-Activity Limitations Lift;Locomotion Level;Squat;Stairs;Stand;Transfers    Examination-Participation Restrictions Community Activity;Occupation    Stability/Clinical Decision Making Evolving/Moderate complexity    Clinical Decision Making Moderate    Rehab Potential Good    PT Frequency 2x / week    PT Duration Other (comment)   10 weeks   PT Treatment/Interventions ADLs/Self Care Home Management;DME Instruction;Gait training;Stair training;Functional mobility training;Therapeutic activities;Therapeutic exercise;Balance training;Neuromuscular re-education;Patient/family education;Prosthetic Training;Manual techniques;Passive range of motion    PT Next Visit Plan set up HEP, review prosthetic care, pregait to work on prosthetic control    Recommended Other [place] and Agree with Plan of Care Patient           Patient will benefit from skilled therapeutic intervention in order to improve the following deficits and impairments:  Abnormal gait,Decreased activity tolerance,Decreased balance,Decreased endurance,Decreased knowledge of use of DME,Decreased mobility,Decreased range of motion,Decreased strength,Increased edema,Postural  dysfunction,Prosthetic Dependency,Pain  Visit Diagnosis: Other abnormalities of gait and mobility  Unsteadiness on feet  Muscle weakness (generalized)  Stiffness of left hip, not elsewhere classified  History of falling  Abnormal posture     Problem List Patient Active Problem List   Diagnosis Date Noted  . Cryptogenic stroke ([place]) 05/10/2021  . Loop Biotronic [id number] 05/10/2021 05/10/2021  . AVM (arteriovenous malformation) 03/20/2021  . Pulmonary AV (arteriovenous) fistula ([place]) 02/20/2021  . Pre-ulcerative calluses 12/20/2020  . Cerebral thrombosis with cerebral infarction 12/11/2020  . Cerebrovascular accident (CVA) due to embolism of cerebral artery ([place]) 12/11/2020  . Pressure injury of skin 11/12/2020  . Acute urinary retention 11/09/2020  . Normal  anion gap metabolic acidosis 29/57/4734  . Hypotension 11/07/2020  . Chest pain 10/31/2020  . Acute on chronic HFrEF (heart failure with reduced ejection fraction) ([place])   . Goals of care, counseling/discussion   . Palliative care by specialist   . DNR (do not resuscitate)   . Critical limb ischemia with history of revascularization of same extremity ([place]) 10/30/2020  . Hyperglycemia 10/30/2020  . Tobacco abuse 10/30/2020  . Leukocytosis 10/30/2020  . Chronic combined systolic and diastolic CHF (congestive heart failure) ([place]) 10/30/2020  . Ischemic cardiomyopathy 10/24/2020  . AKI (acute kidney injury) ([place]) 10/17/2020  . PAD (peripheral artery disease) ([place]) 10/16/2020  . Critical lower limb ischemia ([place]) 10/05/2020  . Rest pain of left upper extremity due to atherosclerosis ([place]) 10/05/2020  . Severe claudication ([place]) 09/20/2020  . Paresthesia of both lower extremities 09/20/2020  . H/O cardiac arrest 09/20/2020  . H/O acute myocardial infarction 08/14/2020  . Snoring 08/14/2020  . Sinus pause 08/14/2020  . Mixed hyperlipidemia 08/14/2020  . Tobacco dependence 08/14/2020  . Coronary artery disease  involving native coronary artery of native heart without angina pectoris 08/10/2020  . HFrEF (heart failure with reduced ejection fraction) ([place]) 08/10/2020  . Nonrheumatic mitral valve regurgitation 08/10/2020  . STEMI (ST elevation myocardial infarction) ([place]) 08/02/2020  . Accelerated hypertension 08/01/2020    Jamey Reas, PT, DPT 05/22/2021, 4:16 PM  [facility] Physical [facility] [address] [place], [place], [address] Phone: [phone]   Fax:  [phone]  Name: CRISS PALLONE MRN: [id number] Date of Birth: 1961/04/06

## 2021-05-23 ENCOUNTER — Other Ambulatory Visit: Payer: Self-pay

## 2021-05-23 ENCOUNTER — Ambulatory Visit (INDEPENDENT_AMBULATORY_CARE_PROVIDER_SITE_OTHER): Payer: BC Managed Care – PPO | Admitting: Physical Therapy

## 2021-05-23 ENCOUNTER — Encounter: Payer: Self-pay | Admitting: Physical Therapy

## 2021-05-23 DIAGNOSIS — R293 Abnormal posture: Secondary | ICD-10-CM

## 2021-05-23 DIAGNOSIS — R2681 Unsteadiness on feet: Secondary | ICD-10-CM | POA: Diagnosis not present

## 2021-05-23 DIAGNOSIS — M6281 Muscle weakness (generalized): Secondary | ICD-10-CM | POA: Diagnosis not present

## 2021-05-23 DIAGNOSIS — M25652 Stiffness of left hip, not elsewhere classified: Secondary | ICD-10-CM

## 2021-05-23 DIAGNOSIS — R2689 Other abnormalities of gait and mobility: Secondary | ICD-10-CM | POA: Diagnosis not present

## 2021-05-23 NOTE — Therapy (Signed)
[facility] [address] [place], [place], [address] Phone: [phone]   Fax:  [phone]  Physical Therapy Treatment  Patient Details  Name: Mike [name] MRN: [id number] Date of Birth: 1961-10-19 Referring Provider (PT): Meridee Score, MD   Encounter Date: 05/23/2021   PT End of Session - 05/23/21 0935    Visit Number 2    Number of Visits 21    Date for PT Re-Evaluation 08/09/21    Authorization Type BCBS    Authorization Time Period OOP & Deductible MET    PT Start Time 0930    PT Stop Time 1016    PT Time Calculation (min) 46 min    Equipment Utilized During Treatment Gait belt    Activity Tolerance Patient tolerated treatment well    Behavior During Therapy [facility] for tasks assessed/performed           Past Medical History:  Diagnosis Date  . Anxiety   . CAD (coronary artery disease)   . Cryptogenic stroke ([place]) 05/10/2021  . Heart attack ([place]) 08/01/2020  . Heart failure with reduced ejection fraction ([place])   . Hyperlipidemia   . Hypertension   . Loop [employer] [id number] 05/10/2021 05/10/2021  . PAD (peripheral artery disease) ([place])    right CIA & right distal iliac artery stents 10/16/20; left AKA 11/06/20  . PFO (patent foramen ovale) 12/14/2020  . Pneumonia   . Pulmonary AV (arteriovenous) fistula ([place]) 12/14/2020  . Stroke ([place]) 12/11/2020   no residuial effects    Past Surgical History:  Procedure Laterality Date  . ABDOMINAL AORTOGRAM W/LOWER EXTREMITY N/A 10/16/2020   Procedure: ABDOMINAL AORTOGRAM W/LOWER EXTREMITY;  Surgeon: Nigel Mormon, MD;  Location: [place] [facility];  Service: Cardiovascular;  Laterality: N/A;  . AMPUTATION Left 11/06/2020   Procedure: AMPUTATION ABOVE KNEE;  Surgeon: Rosetta Posner, MD;  Location: [place];  Service: Vascular;  Laterality: Left;  . BUBBLE STUDY  12/14/2020   Procedure: BUBBLE STUDY;  Surgeon: Nigel Mormon, MD;  Location: [facility];  Service: Cardiovascular;;   . CARDIAC CATHETERIZATION    . CORONARY STENT INTERVENTION N/A 08/03/2020   Procedure: CORONARY STENT INTERVENTION;  Surgeon: Nigel Mormon, MD;  Location: [place] [facility];  Service: Cardiovascular;  Laterality: N/A;  . CORONARY/GRAFT ACUTE MI REVASCULARIZATION N/A 08/01/2020   Procedure: Coronary/Graft Acute MI Revascularization;  Surgeon: Nigel Mormon, MD;  Location: [place] [facility];  Service: Cardiovascular;  Laterality: N/A;  . INTRAVASCULAR LITHOTRIPSY Right 10/16/2020   Procedure: INTRAVASCULAR LITHOTRIPSY;  Surgeon: Nigel Mormon, MD;  Location: [place] [facility];  Service: Cardiovascular;  Laterality: Right;  Common and External Iliac  . IR ANGIOGRAM PULMONARY LEFT SELECTIVE  03/20/2021  . IR ANGIOGRAM SELECTIVE EACH ADDITIONAL VESSEL  03/20/2021  . IR EMBO ARTERIAL NOT HEMORR HEMANG INC GUIDE ROADMAPPING  03/20/2021  . IR RADIOLOGIST EVAL & MGMT  01/30/2021  . IR RADIOLOGIST EVAL & MGMT  04/18/2021  . IR US GUIDE VASC ACCESS RIGHT  03/20/2021  . LEFT HEART CATH AND CORONARY ANGIOGRAPHY N/A 08/01/2020   Procedure: LEFT HEART CATH AND CORONARY ANGIOGRAPHY;  Surgeon: Nigel Mormon, MD;  Location: [place] [facility];  Service: Cardiovascular;  Laterality: N/A;  . LEFT HEART CATH AND CORONARY ANGIOGRAPHY N/A 08/03/2020   Procedure: LEFT HEART CATH AND CORONARY ANGIOGRAPHY;  Surgeon: Nigel Mormon, MD;  Location: [place] [facility];  Service: Cardiovascular;  Laterality: N/A;  . PERIPHERAL VASCULAR INTERVENTION Right 10/16/2020  Procedure: PERIPHERAL VASCULAR INTERVENTION;  Surgeon: Nigel Mormon, MD;  Location: [place] [facility];  Service: Cardiovascular;  Laterality: Right;  Common and Iliac Stent  . RADIOLOGY WITH ANESTHESIA N/A 03/20/2021   Procedure: PULMONARY EMBOLIZATION;  Surgeon: Aletta Edouard, MD;  Location: [place];  Service: Radiology;  Laterality: N/A;  . TEE WITHOUT CARDIOVERSION N/A 12/14/2020   Procedure: TRANSESOPHAGEAL ECHOCARDIOGRAM  (TEE);  Surgeon: Nigel Mormon, MD;  Location: [facility];  Service: Cardiovascular;  Laterality: N/A;    There were no vitals filed for this visit.   Subjective Assessment - 05/23/21 0930    Subjective He donned prosthesis tightening strap as PT directed yesterday.  He wore prosthesis 2hrs 2x yesterday as directed.    Pertinent History PAD, gangrene s/p L AKA 11/06/20, HTN, CAD s/p STEMI with stenting 08/17/2020, and CHF. cryptogenic stroke Dec 2021    Patient Stated Goals to return to work, (he was released from current job over last weekend but wants to find job).  to walk in community,    Currently in Pain? No/denies                             [facility] Adult PT Treatment/Exercise - 05/23/21 0930      Ambulation/Gait   Pre-Gait Activities pre-gait for TFA - see pt instructions      Neuro Re-ed    Neuro Re-ed Details  HEP at sink - see pt instructions.  PT cues to use socket- limb interface for proprioception            Do each exercise 1-2  times per day Do each exercise 10 repetitions Hold each exercise for 2 seconds to feel your location  AT [place].  Try to find this position when standing still for activities.   USE TAPE ON FLOOR TO MARK THE MIDLINE POSITION. You also should try to feel with your limb pressure in socket.  You are trying to feel with limb what you used to feel with the bottom of your foot.  1. Side to Side Shift: Moving your hips only (not shoulders): move weight onto your left leg, HOLD/FEEL.  Move back to equal weight on each leg, HOLD/FEEL. Move weight onto your right leg, HOLD/FEEL. Move back to equal weight on each leg, HOLD/FEEL. Repeat.  Start with both hands on sink, progress to left hand only, then no hands.  2. Front to Back Shift: Moving your hips only (not shoulders): move your weight forward onto your toes, HOLD/FEEL. Move your weight back to equal Flat  Foot on both legs, HOLD/FEEL. Move your weight back onto your heels, HOLD/FEEL. Move your weight back to equal on both legs, HOLD/FEEL. Repeat.   start with both hands on sink, progress to left hand only, then no hands. 3. Moving Cones / Cups: With equal weight on each leg: Hold on with one hand the first time, then progress to no hand supports. Move cups from one side of sink to the other. Place cups ~2" out of your reach, progress to 10" beyond reach.  Place one hand in middle of sink and reach with other hand. Do both arms.  Then hover one hand and move cups with other hand.  4. Overhead/Upward Reaching: alternated reaching up to top cabinets or ceiling if no cabinets present. Keep equal weight on each leg. Start with one hand support on  counter while other hand reaches and progress to no hand support with reaching.  ace one hand in middle of sink and reach with other hand. Do both arms.  Then hover one hand and move cups with other hand.  5.   Looking Over Shoulders: With equal weight on each leg: alternate turning to look over your shoulders with one hand support on counter as needed.  Start with head motions only to look in front of shoulder, then even with shoulder and progress to looking behind you. To look to side, move head /eyes, then shoulder on side looking pulls back, shift more weight to side looking and pull hip back. ace one hand in middle of sink and let go with other hand so your shoulder can pull back. Switch hands to look other way.   Then hover one hand and move cups with other hand.  6.  Stepping with leg that is not amputated:  Move items under cabinet out of your way. Shift your hips/pelvis so weight on prosthesis. SLOWLY step other leg so front of foot is in cabinet. Then step back to floor.    Stand at [employer] (on your left).  Keep hips fairly close to counter top (like 2" at most) and cane or chair back in right hand.     Stand with sound limb forward step. Draw  imaginary line in front of prosthesis & place right foot over the line.   Step 1: Start with weight on prosthesis. Weight shift forward to left leg keeping right knee straight without locking it (don't squat!!). Prosthetic knee should bend and keep prosthetic toe on ground.  Repeat step 1 for 10 reps.   Step 2: Bring prosthesis forward with pelvic rotation & hip flexion (moving leg straight forward-not out to the side-think don't hit the counter top) "placing heel" on floor. Repeat step 1 + step 2 for 10 reps   Step 3: Weight shift forward so pelvis (hips) over prosthesis & right knee is flexing (keep leg relaxed). Maintain right toe on ground. Repeat step 1 + 2 + 3 for 10 reps   Step 4: Step right foot forward stepping over imaginary line (pass the toe of your left foot with the heel of your left foot) in front of prosthesis. Repeat step 1 + 2 + 3 + 4 for 10 reps   *Keep an eye on your foot placement and adjust back to starting position as needed.       PT Education - 05/23/21 1015    Education Details HEP at sink & pregait - see pt instructions    Person(s) Educated Patient    Methods Explanation;Demonstration;Tactile cues;Verbal cues;Handout    Comprehension Verbalized understanding;Returned demonstration;Verbal cues required;Tactile cues required;Need further instruction            PT Short Term Goals - 05/22/21 1610      PT SHORT TERM GOAL #1   Title Patient donnes prosthesis modified independent & verbalizes proper cleaning.    Time 1    Period Months    Status New    Target Date 06/28/21      PT SHORT TERM GOAL #2   Title Patient tolerates prosthesis >12 hrs total /day without skin issues or limb pain    Time 1    Period Months    Status New    Target Date 06/28/21      PT SHORT TERM GOAL #3   Title Patient able to pick up  items from floor & reach 10" without UE support safely.    Time 1    Period Months    Status New    Target Date 06/28/21      PT SHORT TERM  GOAL #4   Title Patient ambulates 200' with RW & prosthesis with supervision with proper prosthetic knee control.    Time 1    Period Months    Status New    Target Date 06/28/21      PT SHORT TERM GOAL #5   Title Patient negotiates ramps & curbs with RW & prosthesis with supervision.    Time 1    Period Months    Status New    Target Date 06/28/21             PT Long Term Goals - 05/22/21 1606      PT LONG TERM GOAL #1   Title Patient demonstrates & verbalized understanding of prosthetic care to enable safe utilization of prosthesis.    Time 10    Period Weeks    Status New    Target Date 08/09/21      PT LONG TERM GOAL #2   Title Patient tolerates prosthesis wear >90% of awake hours without skin or limb pain issues.    Time 10    Period Weeks    Status New    Target Date 08/09/21      PT LONG TERM GOAL #3   Title Berg Balance >/= 45/56 to indicate lower fall risk    Time 10    Period Weeks    Status New    Target Date 08/09/21      PT LONG TERM GOAL #4   Title Patient ambulates 500' outdoors with LRAD & prosthesis modified independent    Time 10    Period Weeks    Status New    Target Date 08/09/21      PT LONG TERM GOAL #5   Title Patient negotiates ramps, curbs & stairs with LRAD & prosthesis modified independent.    Time 10    Period Weeks    Status New    Target Date 08/09/21      Additional Long Term Goals   Additional Long Term Goals Yes      PT LONG TERM GOAL #6   Title Patient demo proper lifting, carrying up to 30#, push/pull task and climbing ladder for return to work skills.    Time 10    Period Weeks    Status New    Target Date 08/09/21                 Plan - 05/23/21 0935    Clinical Impression Statement PT instructed pt in HEP at sink & counter to facilitate balance, standing tolerance, proprioception using socket - limb interface and proper gait pattern.  He appears to have a general understanding.    Personal Factors and  Comorbidities Comorbidity 3+;Fitness;Time since onset of injury/illness/exacerbation    Comorbidities PAD, gangrene s/p L AKA 11/06/20, HTN, CAD s/p STEMI with stenting 08/17/2020, and CHF. cryptogenic stroke Dec 2021    Examination-Activity Limitations Lift;Locomotion Level;Squat;Stairs;Stand;Transfers    Examination-Participation Restrictions Community Activity;Occupation    Stability/Clinical Decision Making Evolving/Moderate complexity    Rehab Potential Good    PT Frequency 2x / week    PT Duration Other (comment)   10 weeks   PT Treatment/Interventions ADLs/Self Care Home Management;DME Instruction;Gait training;Stair training;Functional mobility training;Therapeutic activities;Therapeutic exercise;Balance training;Neuromuscular re-education;Patient/family  education;Prosthetic Training;Manual techniques;Passive range of motion    PT Next Visit Plan check HEP, review prosthetic care, instruct in ramps & curbs with prosthesis with RW,  introduce 4 point gait sequence as seated exercise to prepare to progress to crutches. work on sit to/from stand engaging prosthesis.    Consulted and Agree with Plan of Care Patient           Patient will benefit from skilled therapeutic intervention in order to improve the following deficits and impairments:  Abnormal gait,Decreased activity tolerance,Decreased balance,Decreased endurance,Decreased knowledge of use of DME,Decreased mobility,Decreased range of motion,Decreased strength,Increased edema,Postural dysfunction,Prosthetic Dependency,Pain  Visit Diagnosis: Unsteadiness on feet  Other abnormalities of gait and mobility  Muscle weakness (generalized)  Stiffness of left hip, not elsewhere classified  Abnormal posture     Problem List Patient Active Problem List   Diagnosis Date Noted  . Cryptogenic stroke ([place]) 05/10/2021  . Loop [employer]otronic [id number] 05/10/2021 05/10/2021  . AVM (arteriovenous malformation) 03/20/2021  . Pulmonary AV  (arteriovenous) fistula ([place]) 02/20/2021  . Pre-ulcerative calluses 12/20/2020  . Cerebral thrombosis with cerebral infarction 12/11/2020  . Cerebrovascular accident (CVA) due to embolism of cerebral artery ([place]) 12/11/2020  . Pressure injury of skin 11/12/2020  . Acute urinary retention 11/09/2020  . Normal anion gap metabolic acidosis 29/93/7169  . Hypotension 11/07/2020  . Chest pain 10/31/2020  . Acute on chronic HFrEF (heart failure with reduced ejection fraction) ([place])   . Goals of care, counseling/discussion   . Palliative care by specialist   . DNR (do not resuscitate)   . Critical limb ischemia with history of revascularization of same extremity ([place]) 10/30/2020  . Hyperglycemia 10/30/2020  . Tobacco abuse 10/30/2020  . Leukocytosis 10/30/2020  . Chronic combined systolic and diastolic CHF (congestive heart failure) ([place]) 10/30/2020  . Ischemic cardiomyopathy 10/24/2020  . AKI (acute kidney injury) ([place]) 10/17/2020  . PAD (peripheral artery disease) ([place]) 10/16/2020  . Critical lower limb ischemia ([place]) 10/05/2020  . Rest pain of left upper extremity due to atherosclerosis ([place]) 10/05/2020  . Severe claudication ([place]) 09/20/2020  . Paresthesia of both lower extremities 09/20/2020  . H/O cardiac arrest 09/20/2020  . H/O acute myocardial infarction 08/14/2020  . Snoring 08/14/2020  . Sinus pause 08/14/2020  . Mixed hyperlipidemia 08/14/2020  . Tobacco dependence 08/14/2020  . Coronary artery disease involving native coronary artery of native heart without angina pectoris 08/10/2020  . HFrEF (heart failure with reduced ejection fraction) ([place]) 08/10/2020  . Nonrheumatic mitral valve regurgitation 08/10/2020  . STEMI (ST elevation myocardial infarction) ([place]) 08/02/2020  . Accelerated hypertension 08/01/2020    Jamey Reas, PT, DPT 05/23/2021, 1:55 PM  [facility] [address] [place], [place], [address] Phone: [phone]   Fax:   [phone]  Name: Mike [name] MRN: [id number] Date of Birth: September 06, 1961

## 2021-05-23 NOTE — Patient Instructions (Signed)
Do each exercise 1-2  times per day Do each exercise 10 repetitions Hold each exercise for 2 seconds to feel your location  AT [place].  Try to find this position when standing still for activities.   USE TAPE ON FLOOR TO MARK THE MIDLINE POSITION. You also should try to feel with your limb pressure in socket.  You are trying to feel with limb what you used to feel with the bottom of your foot.  1. Side to Side Shift: Moving your hips only (not shoulders): move weight onto your left leg, HOLD/FEEL.  Move back to equal weight on each leg, HOLD/FEEL. Move weight onto your right leg, HOLD/FEEL. Move back to equal weight on each leg, HOLD/FEEL. Repeat.  Start with both hands on sink, progress to left hand only, then no hands.  2. Front to Back Shift: Moving your hips only (not shoulders): move your weight forward onto your toes, HOLD/FEEL. Move your weight back to equal Flat Foot on both legs, HOLD/FEEL. Move your weight back onto your heels, HOLD/FEEL. Move your weight back to equal on both legs, HOLD/FEEL. Repeat.   start with both hands on sink, progress to left hand only, then no hands. 3. Moving Cones / Cups: With equal weight on each leg: Hold on with one hand the first time, then progress to no hand supports. Move cups from one side of sink to the other. Place cups ~2" out of your reach, progress to 10" beyond reach.  Place one hand in middle of sink and reach with other hand. Do both arms.  Then hover one hand and move cups with other hand.  4. Overhead/Upward Reaching: alternated reaching up to top cabinets or ceiling if no cabinets present. Keep equal weight on each leg. Start with one hand support on counter while other hand reaches and progress to no hand support with reaching.  ace one hand in middle of sink and reach with other hand. Do both arms.  Then hover one hand and move cups with other hand.  5.   Looking Over  Shoulders: With equal weight on each leg: alternate turning to look over your shoulders with one hand support on counter as needed.  Start with head motions only to look in front of shoulder, then even with shoulder and progress to looking behind you. To look to side, move head /eyes, then shoulder on side looking pulls back, shift more weight to side looking and pull hip back. ace one hand in middle of sink and let go with other hand so your shoulder can pull back. Switch hands to look other way.   Then hover one hand and move cups with other hand.  6.  Stepping with leg that is not amputated:  Move items under cabinet out of your way. Shift your hips/pelvis so weight on prosthesis. SLOWLY step other leg so front of foot is in cabinet. Then step back to floor.    Stand at [employer] (on your left).  Keep hips fairly close to counter top (like 2" at most) and cane or chair back in right hand.     Stand with sound limb forward step. Draw imaginary line in front of prosthesis & place right foot over the line.   Step 1: Start with weight on prosthesis. Weight shift forward to left leg keeping right knee straight without locking it (don't squat!!). Prosthetic knee should bend and keep  prosthetic toe on ground.  Repeat step 1 for 10 reps.   Step 2: Bring prosthesis forward with pelvic rotation & hip flexion (moving leg straight forward-not out to the side-think don't hit the counter top) "placing heel" on floor. Repeat step 1 + step 2 for 10 reps   Step 3: Weight shift forward so pelvis (hips) over prosthesis & right knee is flexing (keep leg relaxed). Maintain right toe on ground. Repeat step 1 + 2 + 3 for 10 reps   Step 4: Step right foot forward stepping over imaginary line (pass the toe of your left foot with the heel of your left foot) in front of prosthesis. Repeat step 1 + 2 + 3 + 4 for 10 reps   *Keep an eye on your foot placement and adjust back to starting position as needed.

## 2021-05-29 ENCOUNTER — Telehealth: Payer: Self-pay | Admitting: Cardiology

## 2021-05-29 ENCOUNTER — Encounter: Payer: Self-pay | Admitting: Physical Therapy

## 2021-05-29 ENCOUNTER — Other Ambulatory Visit: Payer: Self-pay

## 2021-05-29 ENCOUNTER — Ambulatory Visit (INDEPENDENT_AMBULATORY_CARE_PROVIDER_SITE_OTHER): Payer: BC Managed Care – PPO | Admitting: Physical Therapy

## 2021-05-29 DIAGNOSIS — R2681 Unsteadiness on feet: Secondary | ICD-10-CM

## 2021-05-29 DIAGNOSIS — M25652 Stiffness of left hip, not elsewhere classified: Secondary | ICD-10-CM

## 2021-05-29 DIAGNOSIS — M6281 Muscle weakness (generalized): Secondary | ICD-10-CM

## 2021-05-29 DIAGNOSIS — R293 Abnormal posture: Secondary | ICD-10-CM

## 2021-05-29 DIAGNOSIS — R2689 Other abnormalities of gait and mobility: Secondary | ICD-10-CM | POA: Diagnosis not present

## 2021-05-29 NOTE — Telephone Encounter (Signed)
Noted. Will discuss sleep study during upcoming office visit.   Thanks MJP

## 2021-05-29 NOTE — Therapy (Signed)
[facility] [address] [place], [place], [address] Phone: [phone]   Fax:  [phone]  Physical Therapy Treatment  Patient Details  Name: Mike [name] MRN: [id number] Date of Birth: 26-Jul-1961 Referring Provider (PT): Meridee Score, MD   Encounter Date: 05/29/2021   PT End of Session - 05/29/21 1224    Visit Number 3    Number of Visits 21    Date for PT Re-Evaluation 08/09/21    Authorization Type BCBS    Authorization Time Period OOP & Deductible MET    PT Start Time 0930    PT Stop Time 1017    PT Time Calculation (min) 47 min    Equipment Utilized During Treatment Gait belt    Activity Tolerance Patient tolerated treatment well    Behavior During Therapy WFL for tasks assessed/performed           Past Medical History:  Diagnosis Date  . Anxiety   . CAD (coronary artery disease)   . Cryptogenic stroke ([place]) 05/10/2021  . Heart attack ([place]) 08/01/2020  . Heart failure with reduced ejection fraction ([place])   . Hyperlipidemia   . Hypertension   . Loop [employer] [id number] 05/10/2021 05/10/2021  . PAD (peripheral artery disease) ([place])    right CIA & right distal iliac artery stents 10/16/20; left AKA 11/06/20  . PFO (patent foramen ovale) 12/14/2020  . Pneumonia   . Pulmonary AV (arteriovenous) fistula ([place]) 12/14/2020  . Stroke ([place]) 12/11/2020   no residuial effects    Past Surgical History:  Procedure Laterality Date  . ABDOMINAL AORTOGRAM W/LOWER EXTREMITY N/A 10/16/2020   Procedure: ABDOMINAL AORTOGRAM W/LOWER EXTREMITY;  Surgeon: Nigel Mormon, MD;  Location: [place] [facility];  Service: Cardiovascular;  Laterality: N/A;  . AMPUTATION Left 11/06/2020   Procedure: AMPUTATION ABOVE KNEE;  Surgeon: Rosetta Posner, MD;  Location: [place];  Service: Vascular;  Laterality: Left;  . BUBBLE STUDY  12/14/2020   Procedure: BUBBLE STUDY;  Surgeon: Nigel Mormon, MD;  Location: [facility];  Service: Cardiovascular;;   . CARDIAC CATHETERIZATION    . CORONARY STENT INTERVENTION N/A 08/03/2020   Procedure: CORONARY STENT INTERVENTION;  Surgeon: Nigel Mormon, MD;  Location: [place] [facility];  Service: Cardiovascular;  Laterality: N/A;  . CORONARY/GRAFT ACUTE MI REVASCULARIZATION N/A 08/01/2020   Procedure: Coronary/Graft Acute MI Revascularization;  Surgeon: Nigel Mormon, MD;  Location: [place] [facility];  Service: Cardiovascular;  Laterality: N/A;  . INTRAVASCULAR LITHOTRIPSY Right 10/16/2020   Procedure: INTRAVASCULAR LITHOTRIPSY;  Surgeon: Nigel Mormon, MD;  Location: [place] [facility];  Service: Cardiovascular;  Laterality: Right;  Common and External Iliac  . IR ANGIOGRAM PULMONARY LEFT SELECTIVE  03/20/2021  . IR ANGIOGRAM SELECTIVE EACH ADDITIONAL VESSEL  03/20/2021  . IR EMBO ARTERIAL NOT HEMORR HEMANG INC GUIDE ROADMAPPING  03/20/2021  . IR RADIOLOGIST EVAL & MGMT  01/30/2021  . IR RADIOLOGIST EVAL & MGMT  04/18/2021  . IR US GUIDE VASC ACCESS RIGHT  03/20/2021  . LEFT HEART CATH AND CORONARY ANGIOGRAPHY N/A 08/01/2020   Procedure: LEFT HEART CATH AND CORONARY ANGIOGRAPHY;  Surgeon: Nigel Mormon, MD;  Location: [place] [facility];  Service: Cardiovascular;  Laterality: N/A;  . LEFT HEART CATH AND CORONARY ANGIOGRAPHY N/A 08/03/2020   Procedure: LEFT HEART CATH AND CORONARY ANGIOGRAPHY;  Surgeon: Nigel Mormon, MD;  Location: [place] [facility];  Service: Cardiovascular;  Laterality: N/A;  . PERIPHERAL VASCULAR INTERVENTION Right 10/16/2020  Procedure: PERIPHERAL VASCULAR INTERVENTION;  Surgeon: Nigel Mormon, MD;  Location: [place] [facility];  Service: Cardiovascular;  Laterality: Right;  Common and Iliac Stent  . RADIOLOGY WITH ANESTHESIA N/A 03/20/2021   Procedure: PULMONARY EMBOLIZATION;  Surgeon: Aletta Edouard, MD;  Location: [place];  Service: Radiology;  Laterality: N/A;  . TEE WITHOUT CARDIOVERSION N/A 12/14/2020   Procedure: TRANSESOPHAGEAL ECHOCARDIOGRAM  (TEE);  Surgeon: Nigel Mormon, MD;  Location: [facility];  Service: Cardiovascular;  Laterality: N/A;    There were no vitals filed for this visit.   Subjective Assessment - 05/29/21 0930    Subjective He wore prosthesis 2.5-3 hrs 2x/day.  He was on it Saturday 4.5-5 hrs & limb got sore.  Sunday was limited in standing to 3-4 hrs.    Pertinent History PAD, gangrene s/p L AKA 11/06/20, HTN, CAD s/p STEMI with stenting 08/17/2020, and CHF. cryptogenic stroke Dec 2021    Patient Stated Goals to return to work, (he was released from current job over last weekend but wants to find job).  to walk in community,    Currently in Pain? No/denies                             [facility] Adult PT Treatment/Exercise - 05/29/21 0930      Transfers   Transfers Sit to Stand;Stand to Sit    Sit to Stand 5: Supervision;With upper extremity assist;From chair/3-in-1   from chairs with then without armrests using UEs   Sit to Stand Details Visual cues for safe use of DME/AE;Verbal cues for technique;Verbal cues for safe use of DME/AE    Sit to Stand Details (indicate cue type and reason) PT instructed in engaging prosthesis.    Stand to Sit 5: Supervision;With upper extremity assist;With armrests;To chair/3-in-1   to chairs with then without armrests using UEs   Stand to Sit Details (indicate cue type and reason) Visual cues for safe use of DME/AE;Verbal cues for safe use of DME/AE;Verbal cues for technique    Stand to Sit Details PT instructed in engaging prosthesis.      Ambulation/Gait   Ambulation/Gait Yes    Ambulation/Gait Assistance 5: Supervision    Ambulation/Gait Assistance Details PT demo & verbal cues on sequence & technique with RW to facilitate step through pattern with carryover to pre-gait HEP taught last session.    Ambulation Distance (Feet) 100 Feet   100' X 2   Assistive device Rolling walker;Prosthesis    Ambulation Surface Level;Indoor    Stairs Yes    Stairs  Assistance 5: Supervision;4: Min guard    Stairs Assistance Details (indicate cue type and reason) PT demo & verbal cues on technique with single rail & 1 crutch    Stair Management Technique One rail Right;One rail Left;With crutches;Step to pattern;Forwards    Number of Stairs 6   3 w/right rail & left crutch & 3 w/left rail & right crutch   Height of Stairs 6    Ramp 5: Supervision   RW & prosthesis   Ramp Details (indicate cue type and reason) PT instructed with demo, tactile & verbal cues on technique.    Curb 5: Supervision   RW & prosthesis   Curb Details (indicate cue type and reason) PT instructed with demo, tactile & verbal cues on technique.    Pre-Gait Activities seated 4 point sequence exercise 10 reps. Pt demo & verbalized understanding.  Neuro Re-ed    Neuro Re-ed Details  --      Prosthetics   Prosthetic Care Comments  PT instructed in difference to increasing wear & increasing weight bearing.  PT recommended increasing wear to 3hrs 2x/day and limiting weight bearing activities to 5-10 minutes ea 30 min max. He can break up HEP during day to limit weight bearing.    Current prosthetic wear tolerance (days/week)  daily    Current prosthetic wear tolerance (#hours/day)  5 hrs total / day    Current prosthetic weight-bearing tolerance (hours/day)  Pt reports limb pain 4-5/10 with weight bearing.    Edema pitting    Residual limb condition  slight heat rash proximally, no open areas, cylinderical, normal color & temperature,    Education Provided Skin check;Proper wear schedule/adjustment;Proper weight-bearing schedule/adjustment;Other (comment)   see prosthetic care comments   Person(s) Educated Patient    Education Method Explanation;Verbal cues                    PT Short Term Goals - 05/22/21 1610      PT SHORT TERM GOAL #1   Title Patient donnes prosthesis modified independent & verbalizes proper cleaning.    Time 1    Period Months    Status New     Target Date 06/28/21      PT SHORT TERM GOAL #2   Title Patient tolerates prosthesis >12 hrs total /day without skin issues or limb pain    Time 1    Period Months    Status New    Target Date 06/28/21      PT SHORT TERM GOAL #3   Title Patient able to pick up items from floor & reach 10" without UE support safely.    Time 1    Period Months    Status New    Target Date 06/28/21      PT SHORT TERM GOAL #4   Title Patient ambulates 200' with RW & prosthesis with supervision with proper prosthetic knee control.    Time 1    Period Months    Status New    Target Date 06/28/21      PT SHORT TERM GOAL #5   Title Patient negotiates ramps & curbs with RW & prosthesis with supervision.    Time 1    Period Months    Status New    Target Date 06/28/21             PT Long Term Goals - 05/22/21 1606      PT LONG TERM GOAL #1   Title Patient demonstrates & verbalized understanding of prosthetic care to enable safe utilization of prosthesis.    Time 10    Period Weeks    Status New    Target Date 08/09/21      PT LONG TERM GOAL #2   Title Patient tolerates prosthesis wear >90% of awake hours without skin or limb pain issues.    Time 10    Period Weeks    Status New    Target Date 08/09/21      PT LONG TERM GOAL #3   Title Berg Balance >/= 45/56 to indicate lower fall risk    Time 10    Period Weeks    Status New    Target Date 08/09/21      PT LONG TERM GOAL #4   Title Patient ambulates 500' outdoors with LRAD & prosthesis modified independent  Time 10    Period Weeks    Status New    Target Date 08/09/21      PT LONG TERM GOAL #5   Title Patient negotiates ramps, curbs & stairs with LRAD & prosthesis modified independent.    Time 10    Period Weeks    Status New    Target Date 08/09/21      Additional Long Term Goals   Additional Long Term Goals Yes      PT LONG TERM GOAL #6   Title Patient demo proper lifting, carrying up to 30#, push/pull task and  climbing ladder for return to work skills.    Time 10    Period Weeks    Status New    Target Date 08/09/21                 Plan - 05/29/21 1227    Clinical Impression Statement PT instructed in sit to/from stand engaging prosthesis, level surface gait with RW facilitating prosthetic carryover for pre-gait and negotiating ramps, curbs & stairs.  He appears to have general understanding. Patient's residual limb seems tender from progressing weight bearing too rapidly.    Personal Factors and Comorbidities Comorbidity 3+;Fitness;Time since onset of injury/illness/exacerbation    Comorbidities PAD, gangrene s/p L AKA 11/06/20, HTN, CAD s/p STEMI with stenting 08/17/2020, and CHF. cryptogenic stroke Dec 2021    Examination-Activity Limitations Lift;Locomotion Level;Squat;Stairs;Stand;Transfers    Examination-Participation Restrictions Community Activity;Occupation    Stability/Clinical Decision Making Evolving/Moderate complexity    Rehab Potential Good    PT Frequency 2x / week    PT Duration Other (comment)   10 weeks   PT Treatment/Interventions ADLs/Self Care Home Management;DME Instruction;Gait training;Stair training;Functional mobility training;Therapeutic activities;Therapeutic exercise;Balance training;Neuromuscular re-education;Patient/family education;Prosthetic Training;Manual techniques;Passive range of motion    PT Next Visit Plan review prosthetic care,  begin prosthetic gait 4-point with axillary crutches.    Consulted and Agree with Plan of Care Patient           Patient will benefit from skilled therapeutic intervention in order to improve the following deficits and impairments:  Abnormal gait,Decreased activity tolerance,Decreased balance,Decreased endurance,Decreased knowledge of use of DME,Decreased mobility,Decreased range of motion,Decreased strength,Increased edema,Postural dysfunction,Prosthetic Dependency,Pain  Visit Diagnosis: Unsteadiness on feet  Other  abnormalities of gait and mobility  Muscle weakness (generalized)  Stiffness of left hip, not elsewhere classified  Abnormal posture     Problem List Patient Active Problem List   Diagnosis Date Noted  . Cryptogenic stroke ([place]) 05/10/2021  . Loop [employer]ot[employer] [id number] 05/10/2021 05/10/2021  . AVM (arteriovenous malformation) 03/20/2021  . Pulmonary AV (arteriovenous) fistula ([place]) 02/20/2021  . Pre-ulcerative calluses 12/20/2020  . Cerebral thrombosis with cerebral infarction 12/11/2020  . Cerebrovascular accident (CVA) due to embolism of cerebral artery ([place]) 12/11/2020  . Pressure injury of skin 11/12/2020  . Acute urinary retention 11/09/2020  . Normal anion gap metabolic acidosis 33/35/4562  . Hypotension 11/07/2020  . Chest pain 10/31/2020  . Acute on chronic HFrEF (heart failure with reduced ejection fraction) ([place])   . Goals of care, counseling/discussion   . Palliative care by specialist   . DNR (do not resuscitate)   . Critical limb ischemia with history of revascularization of same extremity ([place]) 10/30/2020  . Hyperglycemia 10/30/2020  . Tobacco abuse 10/30/2020  . Leukocytosis 10/30/2020  . Chronic combined systolic and diastolic CHF (congestive heart failure) ([place]) 10/30/2020  . Ischemic cardiomyopathy 10/24/2020  . AKI (acute kidney injury) ([place]) 10/17/2020  . PAD (  peripheral artery disease) ([place]) 10/16/2020  . Critical lower limb ischemia ([place]) 10/05/2020  . Rest pain of left upper extremity due to atherosclerosis ([place]) 10/05/2020  . Severe claudication ([place]) 09/20/2020  . Paresthesia of both lower extremities 09/20/2020  . H/O cardiac arrest 09/20/2020  . H/O acute myocardial infarction 08/14/2020  . Snoring 08/14/2020  . Sinus pause 08/14/2020  . Mixed hyperlipidemia 08/14/2020  . Tobacco dependence 08/14/2020  . Coronary artery disease involving native coronary artery of native heart without angina pectoris 08/10/2020  . HFrEF (heart failure with  reduced ejection fraction) ([place]) 08/10/2020  . Nonrheumatic mitral valve regurgitation 08/10/2020  . STEMI (ST elevation myocardial infarction) ([place]) 08/02/2020  . Accelerated hypertension 08/01/2020    Jamey Reas, PT, DPT 05/29/2021, 12:33 PM  [facility] [address] [place], [place], [address] Phone: [phone]   Fax:  [phone]  Name: MADDOXX BURKITT MRN: [id number] Date of Birth: Apr 13, 1961

## 2021-06-04 ENCOUNTER — Other Ambulatory Visit: Payer: Self-pay | Admitting: Cardiology

## 2021-06-04 DIAGNOSIS — I5020 Unspecified systolic (congestive) heart failure: Secondary | ICD-10-CM

## 2021-06-07 ENCOUNTER — Encounter: Payer: Self-pay | Admitting: Cardiology

## 2021-06-07 ENCOUNTER — Ambulatory Visit: Payer: BC Managed Care – PPO | Admitting: Cardiology

## 2021-06-07 ENCOUNTER — Other Ambulatory Visit: Payer: Self-pay

## 2021-06-07 VITALS — BP 103/64 | HR 77 | Temp 98.4°F | Resp 16 | Ht 72.0 in | Wt 167.0 lb

## 2021-06-07 DIAGNOSIS — I455 Other specified heart block: Secondary | ICD-10-CM

## 2021-06-07 DIAGNOSIS — I739 Peripheral vascular disease, unspecified: Secondary | ICD-10-CM

## 2021-06-07 DIAGNOSIS — I2510 Atherosclerotic heart disease of native coronary artery without angina pectoris: Secondary | ICD-10-CM

## 2021-06-07 DIAGNOSIS — Z95818 Presence of other cardiac implants and grafts: Secondary | ICD-10-CM

## 2021-06-07 DIAGNOSIS — I639 Cerebral infarction, unspecified: Secondary | ICD-10-CM

## 2021-06-07 NOTE — Progress Notes (Signed)
Follow up visit  Subjective:   Mike [name], male    DOB: 1961-10-21, 60 y.o.   MRN: [id number]    HPI   Chief Complaint  Patient presents with   PAD   HFrEF (heart failure with reduced ejection fraction)   Follow-up    4 week    60 y.o. Caucasian male with CAD, culprit (RCA) and nonculprit (LCx) PCI after STEMI 07/2020, HFrEF, moderate MR, h/o cardiac arrest 2/2 Torsades post PCI during index hospitalization, PAD with critical limb ischemia s/p Rt iliac revascularization, and s/p Lt AKA amputation for critical limb ischemia,  severe bilateral renal artery stenoses w/h/o CIN (09/2020), multifocal stroke s/p tPA with resolution of neurodeficit (11/2020), large AV pulmonary fistula-s/p closure by Dr. Kathlene Cote (02/2021), small PFO.  Patient underwent ILR placement given cryptogenic stroke (04/2021). He had post procedural bleeding requiring additional sutures. ILR has showed asymptomatic sinus pauses up to 5 sec during sleep.   He now has left leg prosthesis. He is still getting used to it. He denies chest pain, shortness of breath, palpitations, leg edema, orthopnea, PND, TIA/syncope.    Current Outpatient Medications on File Prior to Visit  Medication Sig Dispense Refill   acetaminophen (TYLENOL) 500 MG tablet Take 500-1,000 mg by mouth every 6 (six) hours as needed (pain).     aspirin EC 81 MG tablet Take 1 tablet (81 mg total) by mouth daily. Swallow whole. (Patient taking differently: Take 81 mg by mouth every evening. Swallow whole.) 90 tablet 0   clopidogrel (PLAVIX) 75 MG tablet TAKE 1 TABLET(75 MG) BY MOUTH DAILY (Patient taking differently: Take 75 mg by mouth every evening.) 30 tablet 6   ENTRESTO 49-51 MG TAKE 1 TABLET BY MOUTH TWICE DAILY 60 tablet 2   furosemide (LASIX) 20 MG tablet TAKE 1 TABLET(20 MG) BY MOUTH DAILY 30 tablet 3   gabapentin (NEURONTIN) 300 MG capsule Take 1 capsule (300 mg total) by mouth 2 (two) times daily. (Patient taking differently: Take 300 mg  by mouth in the morning and at bedtime.) 60 capsule 0   metoprolol succinate (TOPROL-XL) 25 MG 24 hr tablet Take 1 tablet (25 mg total) by mouth daily. Take with or immediately following a meal. (Patient taking differently: Take 25 mg by mouth every evening. Take with or immediately following a meal.) 90 tablet 3   Multiple Vitamins-Minerals (MULTI FOR HIM 50+) TABS Take 1 tablet by mouth See admin instructions.     nitroGLYCERIN (NITROSTAT) 0.4 MG SL tablet Place 1 tablet (0.4 mg total) under the tongue every 5 (five) minutes as needed for chest pain. (Patient taking differently: Place 0.4 mg under the tongue every 5 (five) minutes as needed for chest pain (max 3 doses).) 30 tablet 1   rivaroxaban (XARELTO) 2.5 MG TABS tablet Take 1 tablet (2.5 mg total) by mouth 2 (two) times daily. 60 tablet 3   rosuvastatin (CRESTOR) 10 MG tablet TAKE 1 TABLET(10 MG) BY MOUTH DAILY (Patient taking differently: Take 10 mg by mouth every evening.) 30 tablet 0   No current facility-administered medications on file prior to visit.    Cardiovascular & other pertient studies:  EKG 03/20/2021: Sinus rhythm 66 bpm Old inferior infarct  Mobile cardiac telemetry 14 days 12/20/2020 - 01/03/2021:  Dominant rhythm: Sinus.  HR 31-167 bpm. Avg HR 76 bpm.  14 episodes of SVT/atrial tachycardia, fastest at 167 bpm, longest for 18.7 sec  <1%% isolated SVE, couplet/triplets.  6 episodes of VT/idioventricular rhythm, fastest at 156 bpm,  longest for 10 beats  8% isolated VE, 40 sec bigeminy, 34m17 sec trigeminy.  2 episodes of sinus pauses,  4-4.6 sec noted during sleep hours.  No atrial fibrillation/atrial flutter//high grade AV block  0 patient triggered events.   Echocardiogram 01/21/2021:  Left ventricle cavity is normal in size. Mild concentric hypertrophy of  the left ventricle. Left ventricle regional wall motion findings: Basal  inferolateral and Basal inferior akinesis, mild global hypokinesis.    Moderately  depressed LV systolic function with visual EF 35-40%. Doppler  evidence of grade I (impaired) diastolic dysfunction, normal LAP.  Left atrial cavity is moderately dilated.  Structurally normal mitral valve. No evidence of mitral stenosis. Moderate  to severe secondary mitral regurgitation due basal inferior akinesis.  Mild tricuspid regurgitation.  No evidence of pulmonary hypertension.  Compared to previous study on 09/11/2020, LVEF appears improved from  15-20%. Mitral regurgitation is increased in severity from mild to  moderate.   CTA Chest 12/14/2020: 1. Large 3.1 x 1.9 cm pulmonary arteriovenous fistula in the medial left lower lobe. 2. Right middle lobe 3 mm solid pulmonary nodule. Follow-up noncontrast chest CT recommended in 12 months in this high risk patient.This recommendation follows the consensus statement: Guidelines for Management of Incidental Pulmonary Nodules Detected on CT Images: From the Fleischner Society 2017; Radiology 2017; 284:228-243. 3. Mild cardiomegaly. 4. Aortic Atherosclerosis (ICD10-I70.0) and Emphysema (ICD10-J43.9).    PV intervention 10/16/2020: Successful intravascular lithotripsy, PTCA and stenting     7.0X39 mm balloon expandable Viabahn VBX stent Rt common iliac artery      7.0X60 mm self expanding Absolute Pro stent Rt distal iliac artery   No named vessels at ankle level in both lower extremities.  Will consult vascular surgery for Rt fem-to-left profunda bypass given left foot critical limb ischemia.   Abdominal Aortic Duplex  09/21/2020:  Moderate plaque noted in the proximal, mid and distal aorta. There is an  ulcerated plaque noted in the distal abdominal aorta. No AAA. Normal iliac  artery velocity.   Lower Extremity Arterial Duplex 09/21/2020:  The right SFA is occluded in the proximal segment with reconstitution at  the level of the popliteal artery with diffuse monophasic waveform below  the knee. Right profunda femoral artery  has >50% stenosis. There is  moderate mixed plaque noted throughout the right lower extremity.  Monophasic waveform throughout the left lower extremity, indicates  significant proximal disease (iliac artery).   Left SFA is occluded in the proximal segment and reconstitutes just above popliteal artery and diffuse dampened monophasic waveform throughout the lower extremity below the  knee. There is moderate mixed plaque throughout the left lower extremity.   This exam reveals severely decreased perfusion of the right lower  extremity, noted at the dorsalis pedis artery level (ABI 0.42) and  critically decreased perfusion of the left lower extremity, noted at the  dorsalis pedis and post tibial artery level (ABI 0.03).   Coronary intervention 08/03/2020: LM: Distal 40% stenosis LAD: Mid 30% disease LCx: Subtotally occluded OM2, prox LCx 70% stenosis        Successful percutaneous coronary intervention OM2-Prox LCx        PTCA and overlapping stents placement         2.5 X 38 mm and 2.5 X 18 mm Resolute Onyx drug-eluting stents        100%--->0% stenosis. TIMI flow 0-->III        Small caliber distal vessel with moderate diffuse disease RCA: Prox 30% stenosis.  Patent mid RCA sttent 2.5 X 26 mm Resolute Onyx drug-eluting stent   LVEDP 42 mmHg   Coronary intervention 08/01/2020: LM: Distal 30% stenosis LAD: Mid 30% disease LCx: Subtotally occluded OM2, bridging left-to-left and right-to-left collaterals RCA: Prox 30% stenosis.        Mid 100% occlusion        Successful percutaneous coronary intervention mid RCA        PTCA and stent placement 2.5 X 26 mm Resolute Onyx drug-eluting stent 100%--->0% stenosis. TIMI flow 0-->III     Recent labs: 03/20/2021: Glucose 85, BUN/Cr 19/1.05. EGFR >60. Na/K 139/3.8. Rest of the CMP normal H/H 13/42. MCV 84. Platelets 212   01/31/2021: Glucose 118, BUN/Cr 22/1.0. EGFR 81. Na/K 145/3.8.   12/14/2020: Glucose 100, BUN/Cr 13/1.09. EGFR  >60. Na/K 140/3.9.  H/H 11.6/35.5. MCV 90.6. Platelets 202 HbA1C 4.6% Chol 130, TG 71, HDL 42, LDL 74  09/25/2020: Glucose 102, BUN/Cr 18/1.16. EGFR 68. Na/K 140/4.5.  NT pro BNP 2404  09/11/2020: Glucose 114, BUN/Cr 19/1.42. EGFR 53. Na/K 136/4.7.  Chol 95, TG 76, HDL 30, LDL 49  08/04/2020: Glucose 101, BUN/Cr 19/1.08. EGFR >60. Na/K 139/4.0. Rest of the CMP normal H/H 14/42. MCV 89. Platelets 184. WBC count 14k HbA1C 5.8% Chol 178, TG 75, HDL 40, LDL 123    Review of Systems  Cardiovascular:  Negative for chest pain, dyspnea on exertion, leg swelling, palpitations and syncope.        Vitals:   06/07/21 1055  BP: 103/64  Pulse: 77  Resp: 16  Temp: 98.4 F (36.9 C)  SpO2: 98%     Body mass index is 22.65 kg/m. Filed Weights   06/07/21 1055  Weight: 167 lb (75.8 kg)     Objective:   Physical Exam Neck:     Vascular: No JVD.  Cardiovascular:     Rate and Rhythm: Normal rate and regular rhythm.     Pulses:          Dorsalis pedis pulses are 0 on the right side.       Posterior tibial pulses are 0 on the right side.     Heart sounds: No murmur heard. Musculoskeletal:     Right lower leg: No edema.     Comments: S/p left AKA Now has prosthesis          Assessment & Recommendations:   60 y.o. Caucasian male with CAD, culprit (RCA) and nonculprit (LCx) PCI after STEMI 07/2020, HFrEF, moderate MR, h/o cardiac arrest 2/2 Torsades post PCI during index hospitalization, PAD with critical limb ischemia s/p Rt iliac revascularization, and s/p Lt AKA amputation for critical limb ischemia,  severe bilateral renal artery stenoses w/h/o CIN (09/2020), stroke s/p tPA with resolution of neurodeficit (11/2020), multifocal stroke on MRI suspicious for cardioembolic source, small PFO/secundum ASD and large pulmonary AV fistula now s/p closure by Dr. Kathlene Cote (02/2021), ILR placement (04/2021), asymptomatic isnus pauses  Asymptomatic sinus pauses: Likely due to OSA. He  wants to hold off sleep testing for now.   Stroke: Multifocal stroke with small PFO/secundum ASD but large pulmonary AV fistula (11/2020), now s/p closure by Dr. Kathlene Cote (02/2021) Repeat TCD bubble study as pr Dr. Clydene Fake recommendations S/p ILR placement (04/2021). No Afib detected thus far  PAD: Successful revascularization of right common iliac and right external iliac artery (09/2020) S/p Lt AKA.  Currently has wound vac. Encouraged him to f/u w/Dr. Stephens Shire office re: prosthesis. No critical limb ischemia, although he has pre-ulcerative calloses  on right sole.   HFrEF, secondary mitral regurgitation: Euvolumic. EF 35-40%. No ICD indicated. Continue Entresto 49-51 mg bid, metoprolol succinate 25 mg daily. Will recheck echocardiogram in 08/2021.   CAD: S/p STEMI 07/2020. Currently no angina symptoms Will stop Aspirin. Continue plavix, Crestor. Given severe CAD, PAD, added Xarelto 2.5 mg bid (COMPASS trial).  No bleeding issues. Will stop Aspirin in 07/2021 (1 year since ACS) Continue Crestor 20 mg  Check echocardiogram, BNP, lipid panel in 3 months F/u in 3 months    Nigel Mormon, MD Pager: [phone] Office: [phone]

## 2021-06-10 ENCOUNTER — Encounter: Payer: Self-pay | Admitting: Physical Therapy

## 2021-06-10 DIAGNOSIS — Z95818 Presence of other cardiac implants and grafts: Secondary | ICD-10-CM | POA: Diagnosis not present

## 2021-06-10 DIAGNOSIS — Z4509 Encounter for adjustment and management of other cardiac device: Secondary | ICD-10-CM | POA: Diagnosis not present

## 2021-06-10 DIAGNOSIS — I5020 Unspecified systolic (congestive) heart failure: Secondary | ICD-10-CM | POA: Diagnosis not present

## 2021-06-11 ENCOUNTER — Other Ambulatory Visit: Payer: Self-pay | Admitting: Interventional Radiology

## 2021-06-11 DIAGNOSIS — Q2730 Arteriovenous malformation, site unspecified: Secondary | ICD-10-CM

## 2021-06-13 ENCOUNTER — Other Ambulatory Visit: Payer: Self-pay

## 2021-06-13 ENCOUNTER — Ambulatory Visit (INDEPENDENT_AMBULATORY_CARE_PROVIDER_SITE_OTHER): Payer: Self-pay | Admitting: Physical Therapy

## 2021-06-13 DIAGNOSIS — R2689 Other abnormalities of gait and mobility: Secondary | ICD-10-CM

## 2021-06-13 DIAGNOSIS — R2681 Unsteadiness on feet: Secondary | ICD-10-CM

## 2021-06-13 DIAGNOSIS — M25652 Stiffness of left hip, not elsewhere classified: Secondary | ICD-10-CM

## 2021-06-13 DIAGNOSIS — M6281 Muscle weakness (generalized): Secondary | ICD-10-CM

## 2021-06-13 DIAGNOSIS — R293 Abnormal posture: Secondary | ICD-10-CM

## 2021-06-13 DIAGNOSIS — Z9181 History of falling: Secondary | ICD-10-CM

## 2021-06-13 NOTE — Therapy (Signed)
[facility] [address] [place], [place], [address] Phone: [phone]   Fax:  [phone]  Physical Therapy Treatment  Patient Details  Name: Mike [name] MRN: [id number] Date of Birth: 12-22-1961 Referring Provider (PT): Meridee Score, MD   Encounter Date: 06/13/2021   PT End of Session - 06/13/21 1447     Visit Number 4    Number of Visits 21    Date for PT Re-Evaluation 08/09/21    Authorization Type BCBS    Authorization Time Period OOP & Deductible MET    PT Start Time 1147    PT Stop Time 6010    PT Time Calculation (min) 43 min    Equipment Utilized During Treatment Gait belt    Activity Tolerance Patient tolerated treatment well    Behavior During Therapy [facility] for tasks assessed/performed             Past Medical History:  Diagnosis Date   Anxiety    CAD (coronary artery disease)    Cryptogenic stroke ([place]) 05/10/2021   Heart attack ([place]) 08/01/2020   Heart failure with reduced ejection fraction ([place])    Hyperlipidemia    Hypertension    [employer] [id number] 05/10/2021 05/10/2021   PAD (peripheral artery disease) ([place])    right CIA & right distal iliac artery stents 10/16/20; left AKA 11/06/20   PFO (patent foramen ovale) 12/14/2020   Pneumonia    Pulmonary AV (arteriovenous) fistula ([place]) 12/14/2020   Stroke ([place]) 12/11/2020   no residuial effects    Past Surgical History:  Procedure Laterality Date   ABDOMINAL AORTOGRAM W/LOWER EXTREMITY N/A 10/16/2020   Procedure: ABDOMINAL AORTOGRAM W/LOWER EXTREMITY;  Surgeon: Nigel Mormon, MD;  Location: [place] [facility];  Service: Cardiovascular;  Laterality: N/A;   AMPUTATION Left 11/06/2020   Procedure: AMPUTATION ABOVE KNEE;  Surgeon: Rosetta Posner, MD;  Location: [place];  Service: Vascular;  Laterality: Left;   BUBBLE STUDY  12/14/2020   Procedure: BUBBLE STUDY;  Surgeon: Nigel Mormon, MD;  Location: [place];  Service: Cardiovascular;;   CARDIAC  CATHETERIZATION     CORONARY STENT INTERVENTION N/A 08/03/2020   Procedure: CORONARY STENT INTERVENTION;  Surgeon: Nigel Mormon, MD;  Location: [place] [facility];  Service: Cardiovascular;  Laterality: N/A;   CORONARY/GRAFT ACUTE MI REVASCULARIZATION N/A 08/01/2020   Procedure: Coronary/Graft Acute MI Revascularization;  Surgeon: Nigel Mormon, MD;  Location: [place] [facility];  Service: Cardiovascular;  Laterality: N/A;   INTRAVASCULAR LITHOTRIPSY Right 10/16/2020   Procedure: INTRAVASCULAR LITHOTRIPSY;  Surgeon: Nigel Mormon, MD;  Location: [place] [facility];  Service: Cardiovascular;  Laterality: Right;  Common and External Iliac   IR ANGIOGRAM PULMONARY LEFT SELECTIVE  03/20/2021   IR ANGIOGRAM SELECTIVE EACH ADDITIONAL VESSEL  03/20/2021   IR EMBO ARTERIAL NOT HEMORR HEMANG INC GUIDE ROADMAPPING  03/20/2021   IR RADIOLOGIST EVAL & MGMT  01/30/2021   IR RADIOLOGIST EVAL & MGMT  04/18/2021   IR US GUIDE VASC ACCESS RIGHT  03/20/2021   LEFT HEART CATH AND CORONARY ANGIOGRAPHY N/A 08/01/2020   Procedure: LEFT HEART CATH AND CORONARY ANGIOGRAPHY;  Surgeon: Nigel Mormon, MD;  Location: [place] [facility];  Service: Cardiovascular;  Laterality: N/A;   LEFT HEART CATH AND CORONARY ANGIOGRAPHY N/A 08/03/2020   Procedure: LEFT HEART CATH AND CORONARY ANGIOGRAPHY;  Surgeon: Nigel Mormon, MD;  Location: [place] [facility];  Service: Cardiovascular;  Laterality: N/A;   PERIPHERAL VASCULAR INTERVENTION  Right 10/16/2020   Procedure: PERIPHERAL VASCULAR INTERVENTION;  Surgeon: Nigel Mormon, MD;  Location: [place] [facility];  Service: Cardiovascular;  Laterality: Right;  Common and Iliac Stent   RADIOLOGY WITH ANESTHESIA N/A 03/20/2021   Procedure: PULMONARY EMBOLIZATION;  Surgeon: Aletta Edouard, MD;  Location: [place];  Service: Radiology;  Laterality: N/A;   TEE WITHOUT CARDIOVERSION N/A 12/14/2020   Procedure: TRANSESOPHAGEAL ECHOCARDIOGRAM (TEE);  Surgeon: Nigel Mormon, MD;  Location: [facility];  Service: Cardiovascular;  Laterality: N/A;    There were no vitals filed for this visit.   Subjective Assessment - 06/13/21 1341     Subjective He relays more distal limb pressure and pain, he wonders if he is not wearing enough ply socks.    Pertinent History PAD, gangrene s/p L AKA 11/06/20, HTN, CAD s/p STEMI with stenting 08/17/2020, and CHF. cryptogenic stroke Dec 2021    Patient Stated Goals to return to work, (he was released from current job over last weekend but wants to find job).  to walk in community,                               [facility] Adult PT Treatment/Exercise - 06/13/21 0001       Transfers   Transfers Sit to Stand;Stand to Sit    Sit to Stand 5: Supervision;With upper extremity assist;From chair/3-in-1   from chairs with then without armrests using UEs   Sit to Stand Details Visual cues for safe use of DME/AE;Verbal cues for technique;Verbal cues for safe use of DME/AE    Stand to Sit 5: Supervision;With upper extremity assist;With armrests;To chair/3-in-1   to chairs with then without armrests using UEs   Stand to Sit Details (indicate cue type and reason) Visual cues for safe use of DME/AE;Verbal cues for safe use of DME/AE;Verbal cues for technique      Ambulation/Gait   Ambulation/Gait Yes    Ambulation/Gait Assistance 4: Min guard    Ambulation/Gait Assistance Details PT demo and verbal cues of technique for 4 point pattern, vaulting to clear prosthesis    Ambulation Distance (Feet) 100 Feet   X2   Assistive device Lofstrands      Exercises   Exercises Knee/Hip      Knee/Hip Exercises: Machines for Strengthening   Cybex Leg Press 62#3X10 DL      Knee/Hip Exercises: Standing   Other Standing Knee Exercises hip 4 way red X15 bilat with UE support bilat      Knee/Hip Exercises: Seated   Sit to Sand 3 sets;5 reps   one UE support in bars from 24 inch bar stool                     PT  Short Term Goals - 05/22/21 1610       PT SHORT TERM GOAL #1   Title Patient donnes prosthesis modified independent & verbalizes proper cleaning.    Time 1    Period Months    Status New    Target Date 06/28/21      PT SHORT TERM GOAL #2   Title Patient tolerates prosthesis >12 hrs total /day without skin issues or limb pain    Time 1    Period Months    Status New    Target Date 06/28/21      PT SHORT TERM GOAL #3   Title Patient able to pick up items  from floor & reach 10" without UE support safely.    Time 1    Period Months    Status New    Target Date 06/28/21      PT SHORT TERM GOAL #4   Title Patient ambulates 200' with RW & prosthesis with supervision with proper prosthetic knee control.    Time 1    Period Months    Status New    Target Date 06/28/21      PT SHORT TERM GOAL #5   Title Patient negotiates ramps & curbs with RW & prosthesis with supervision.    Time 1    Period Months    Status New    Target Date 06/28/21               PT Long Term Goals - 05/22/21 1606       PT LONG TERM GOAL #1   Title Patient demonstrates & verbalized understanding of prosthetic care to enable safe utilization of prosthesis.    Time 10    Period Weeks    Status New    Target Date 08/09/21      PT LONG TERM GOAL #2   Title Patient tolerates prosthesis wear >90% of awake hours without skin or limb pain issues.    Time 10    Period Weeks    Status New    Target Date 08/09/21      PT LONG TERM GOAL #3   Title Berg Balance >/= 45/56 to indicate lower fall risk    Time 10    Period Weeks    Status New    Target Date 08/09/21      PT LONG TERM GOAL #4   Title Patient ambulates 500' outdoors with LRAD & prosthesis modified independent    Time 10    Period Weeks    Status New    Target Date 08/09/21      PT LONG TERM GOAL #5   Title Patient negotiates ramps, curbs & stairs with LRAD & prosthesis modified independent.    Time 10    Period Weeks    Status  New    Target Date 08/09/21      Additional Long Term Goals   Additional Long Term Goals Yes      PT LONG TERM GOAL #6   Title Patient demo proper lifting, carrying up to 30#, push/pull task and climbing ladder for return to work skills.    Time 10    Period Weeks    Status New    Target Date 08/09/21                   Plan - 06/13/21 1448     Clinical Impression Statement It sounded like he needed to add ply socks but he did not have any with him and we reviewed ways to tell if he has too few or too many ply socks on. He was able to progress ambulation from RW to lofstrand crutches with 4 point gait pattern. He had good overall sequence and coordination with this but did vault more on his sound side to clear his prothethic side. After cuing for this he was able to improve.    Personal Factors and Comorbidities Comorbidity 3+;Fitness;Time since onset of injury/illness/exacerbation    Comorbidities PAD, gangrene s/p L AKA 11/06/20, HTN, CAD s/p STEMI with stenting 08/17/2020, and CHF. cryptogenic stroke Dec 2021    Examination-Activity Limitations Lift;Locomotion Level;Squat;Stairs;Stand;Transfers    Examination-Participation  Restrictions Community Activity;Occupation    Stability/Clinical Decision Making Evolving/Moderate complexity    Rehab Potential Good    PT Frequency 2x / week    PT Duration Other (comment)   10 weeks   PT Treatment/Interventions ADLs/Self Care Home Management;DME Instruction;Gait training;Stair training;Functional mobility training;Therapeutic activities;Therapeutic exercise;Balance training;Neuromuscular re-education;Patient/family education;Prosthetic Training;Manual techniques;Passive range of motion    PT Next Visit Plan review prosthetic care,  prosthetic gait 4-point with axillary crutches.    Consulted and Agree with Plan of Care Patient             Patient will benefit from skilled therapeutic intervention in order to improve the following  deficits and impairments:  Abnormal gait, Decreased activity tolerance, Decreased balance, Decreased endurance, Decreased knowledge of use of DME, Decreased mobility, Decreased range of motion, Decreased strength, Increased edema, Postural dysfunction, Prosthetic Dependency, Pain  Visit Diagnosis: Unsteadiness on feet  Other abnormalities of gait and mobility  Muscle weakness (generalized)  Stiffness of left hip, not elsewhere classified  Abnormal posture  History of falling     Problem List Patient Active Problem List   Diagnosis Date Noted   Cryptogenic stroke ([place]) 05/10/2021   [employer] [id number] 05/10/2021 05/10/2021   AVM (arteriovenous malformation) 03/20/2021   Pulmonary AV (arteriovenous) fistula ([place]) 02/20/2021   Pre-ulcerative calluses 12/20/2020   Cerebral thrombosis with cerebral infarction 12/11/2020   Cerebrovascular accident (CVA) due to embolism of cerebral artery ([place]) 12/11/2020   Pressure injury of skin 11/12/2020   Acute urinary retention 11/09/2020   Normal anion gap metabolic acidosis 31/28/1188   Hypotension 11/07/2020   Chest pain 10/31/2020   Acute on chronic HFrEF (heart failure with reduced ejection fraction) ([place])    Goals of care, counseling/discussion    Palliative care by specialist    DNR (do not resuscitate)    Critical limb ischemia with history of revascularization of same extremity ([place]) 10/30/2020   Hyperglycemia 10/30/2020   Tobacco abuse 10/30/2020   Leukocytosis 10/30/2020   Chronic combined systolic and diastolic CHF (congestive heart failure) ([place]) 10/30/2020   Ischemic cardiomyopathy 10/24/2020   AKI (acute kidney injury) ([place]) 10/17/2020   PAD (peripheral artery disease) ([place]) 10/16/2020   Critical lower limb ischemia ([place]) 10/05/2020   Rest pain of left upper extremity due to atherosclerosis ([place]) 10/05/2020   Severe claudication ([place]) 09/20/2020   Paresthesia of both lower extremities 09/20/2020   H/O cardiac arrest  09/20/2020   H/O acute myocardial infarction 08/14/2020   Snoring 08/14/2020   Sinus pause 08/14/2020   Mixed hyperlipidemia 08/14/2020   Tobacco dependence 08/14/2020   Coronary artery disease involving native coronary artery of native heart without angina pectoris 08/10/2020   HFrEF (heart failure with reduced ejection fraction) ([place]) 08/10/2020   Nonrheumatic mitral valve regurgitation 08/10/2020   STEMI (ST elevation myocardial infarction) ([place]) 08/02/2020   Accelerated hypertension 08/01/2020    Silvestre Mesi 06/13/2021, 2:50 PM  [facility] [address] [place], [place], [address] Phone: [phone]   Fax:  [phone]  Name: Mike [name] MRN: [id number] Date of Birth: 03-09-1961

## 2021-06-18 ENCOUNTER — Encounter: Payer: Self-pay | Admitting: Physical Therapy

## 2021-06-18 ENCOUNTER — Ambulatory Visit (INDEPENDENT_AMBULATORY_CARE_PROVIDER_SITE_OTHER): Payer: Self-pay | Admitting: Physical Therapy

## 2021-06-18 ENCOUNTER — Other Ambulatory Visit: Payer: Self-pay

## 2021-06-18 DIAGNOSIS — M25652 Stiffness of left hip, not elsewhere classified: Secondary | ICD-10-CM

## 2021-06-18 DIAGNOSIS — R293 Abnormal posture: Secondary | ICD-10-CM

## 2021-06-18 DIAGNOSIS — Z9181 History of falling: Secondary | ICD-10-CM

## 2021-06-18 DIAGNOSIS — R2681 Unsteadiness on feet: Secondary | ICD-10-CM

## 2021-06-18 DIAGNOSIS — R2689 Other abnormalities of gait and mobility: Secondary | ICD-10-CM

## 2021-06-18 DIAGNOSIS — M6281 Muscle weakness (generalized): Secondary | ICD-10-CM

## 2021-06-18 NOTE — Therapy (Signed)
[facility] [address] [place], [place], [address] Phone: [phone]   Fax:  [phone]  Physical Therapy Treatment  Patient Details  Name: Mike [name] MRN: [id number] Date of Birth: 1961/08/02 Referring Provider (PT): Meridee Score, MD   Encounter Date: 06/18/2021   PT End of Session - 06/18/21 0939     Visit Number 5    Number of Visits 21    Date for PT Re-Evaluation 08/09/21    Authorization Type BCBS    Authorization Time Period OOP & Deductible MET    PT Start Time [phone]    PT Stop Time 1016    PT Time Calculation (min) 40 min    Equipment Utilized During Treatment Gait belt    Activity Tolerance Patient tolerated treatment well    Behavior During Therapy [facility] for tasks assessed/performed             Past Medical History:  Diagnosis Date   Anxiety    CAD (coronary artery disease)    Cryptogenic stroke ([place]) 05/10/2021   Heart attack ([place]) 08/01/2020   Heart failure with reduced ejection fraction ([place])    Hyperlipidemia    Hypertension    [employer] [id number] 05/10/2021 05/10/2021   PAD (peripheral artery disease) ([place])    right CIA & right distal iliac artery stents 10/16/20; left AKA 11/06/20   PFO (patent foramen ovale) 12/14/2020   Pneumonia    Pulmonary AV (arteriovenous) fistula ([place]) 12/14/2020   Stroke ([place]) 12/11/2020   no residuial effects    Past Surgical History:  Procedure Laterality Date   ABDOMINAL AORTOGRAM W/LOWER EXTREMITY N/A 10/16/2020   Procedure: ABDOMINAL AORTOGRAM W/LOWER EXTREMITY;  Surgeon: Nigel Mormon, MD;  Location: [place] [facility];  Service: Cardiovascular;  Laterality: N/A;   AMPUTATION Left 11/06/2020   Procedure: AMPUTATION ABOVE KNEE;  Surgeon: Rosetta Posner, MD;  Location: [place];  Service: Vascular;  Laterality: Left;   BUBBLE STUDY  12/14/2020   Procedure: BUBBLE STUDY;  Surgeon: Nigel Mormon, MD;  Location: [place];  Service: Cardiovascular;;   CARDIAC  CATHETERIZATION     CORONARY STENT INTERVENTION N/A 08/03/2020   Procedure: CORONARY STENT INTERVENTION;  Surgeon: Nigel Mormon, MD;  Location: [place] [facility];  Service: Cardiovascular;  Laterality: N/A;   CORONARY/GRAFT ACUTE MI REVASCULARIZATION N/A 08/01/2020   Procedure: Coronary/Graft Acute MI Revascularization;  Surgeon: Nigel Mormon, MD;  Location: [place] [facility];  Service: Cardiovascular;  Laterality: N/A;   INTRAVASCULAR LITHOTRIPSY Right 10/16/2020   Procedure: INTRAVASCULAR LITHOTRIPSY;  Surgeon: Nigel Mormon, MD;  Location: [place] [facility];  Service: Cardiovascular;  Laterality: Right;  Common and External Iliac   IR ANGIOGRAM PULMONARY LEFT SELECTIVE  03/20/2021   IR ANGIOGRAM SELECTIVE EACH ADDITIONAL VESSEL  03/20/2021   IR EMBO ARTERIAL NOT HEMORR HEMANG INC GUIDE ROADMAPPING  03/20/2021   IR RADIOLOGIST EVAL & MGMT  01/30/2021   IR RADIOLOGIST EVAL & MGMT  04/18/2021   IR US GUIDE VASC ACCESS RIGHT  03/20/2021   LEFT HEART CATH AND CORONARY ANGIOGRAPHY N/A 08/01/2020   Procedure: LEFT HEART CATH AND CORONARY ANGIOGRAPHY;  Surgeon: Nigel Mormon, MD;  Location: [place] [facility];  Service: Cardiovascular;  Laterality: N/A;   LEFT HEART CATH AND CORONARY ANGIOGRAPHY N/A 08/03/2020   Procedure: LEFT HEART CATH AND CORONARY ANGIOGRAPHY;  Surgeon: Nigel Mormon, MD;  Location: [place] [facility];  Service: Cardiovascular;  Laterality: N/A;   PERIPHERAL VASCULAR INTERVENTION  Right 10/16/2020   Procedure: PERIPHERAL VASCULAR INTERVENTION;  Surgeon: Nigel Mormon, MD;  Location: [place] [facility];  Service: Cardiovascular;  Laterality: Right;  Common and Iliac Stent   RADIOLOGY WITH ANESTHESIA N/A 03/20/2021   Procedure: PULMONARY EMBOLIZATION;  Surgeon: Aletta Edouard, MD;  Location: [name];  Service: Radiology;  Laterality: N/A;   TEE WITHOUT CARDIOVERSION N/A 12/14/2020   Procedure: TRANSESOPHAGEAL ECHOCARDIOGRAM (TEE);  Surgeon: Nigel Mormon, MD;  Location: [facility];  Service: Cardiovascular;  Laterality: N/A;    There were no vitals filed for this visit.   Subjective Assessment - 06/18/21 0936     Subjective He is wearing prosthesis for 6-8 hrs total / day.  He waits ~1-2 hours after arising to donne prosthesis and removes 2-3 hours prior to bedtime.  He typically showers in early afternoons.    Pertinent History PAD, gangrene s/p L AKA 11/06/20, HTN, CAD s/p STEMI with stenting 08/17/2020, and CHF. cryptogenic stroke Dec 2021    Patient Stated Goals to return to work, (he was released from current job over last weekend but wants to find job).  to walk in community,    Currently in Pain? No/denies                               [facility] Adult PT Treatment/Exercise - 06/18/21 0936       Transfers   Transfers Sit to Stand;Stand to Sit    Sit to Stand 5: Supervision;With upper extremity assist;From chair/3-in-1   from chairs with then without armrests using UEs   Sit to Stand Details Visual cues for safe use of DME/AE;Verbal cues for technique;Verbal cues for safe use of DME/AE    Stand to Sit 5: Supervision;With upper extremity assist;With armrests;To chair/3-in-1   to chairs with then without armrests using UEs   Stand to Sit Details (indicate cue type and reason) Visual cues for safe use of DME/AE;Verbal cues for safe use of DME/AE;Verbal cues for technique      Ambulation/Gait   Ambulation/Gait Yes    Ambulation/Gait Assistance 4: Min guard;5: Supervision    Ambulation/Gait Assistance Details Minimal verbal cues for sequence initially.  Pt not vaulting to advance prosthesis today stating he has been working on it at home.    Ambulation Distance (Feet) 100 Feet   X2   Assistive device Lofstrands;Prosthesis    Ambulation Surface Level;Indoor    Stairs Yes    Stairs Assistance 5: Supervision    Stairs Assistance Details (indicate cue type and reason) PT demo, verbal & tactile cues on "riding"  prosthetic hydraulics to descend stairs step pattern.    Stair Management Technique Two rails;Step to pattern;Forwards    Number of Stairs 11    Height of Stairs 6    Ramp 4: Min assist   loftstrand crutches & TFA prosthesis   Ramp Details (indicate cue type and reason) PT instructed with tactile & verbal cues on technique.    Curb 5: Supervision   Min guard with forearm crutches & TFA prosthesis.   Curb Details (indicate cue type and reason) PT instructed with tactile & verbal cues on technique.      Self-Care   Self-Care ADL's    ADL's PT verbal cues for instruction in using locked knee for moving & functioning in kitchen.  Pt return demo including moving pot from sink to counter, along counter on folded towel, sidestepping, retreiving item from lower  shelf.  Pt able to get knee in & out of locked mode standing at counter.      Exercises   Exercises Knee/Hip      Knee/Hip Exercises: Machines for Strengthening   Cybex Leg Press --      Knee/Hip Exercises: Standing   Other Standing Knee Exercises --      Knee/Hip Exercises: Seated   Sit to Sand --      Prosthetics   Prosthetic Care Comments  PT demo & verbal cues on getting prosthetic knee in & out of locked mode. Use of locked mode for tightening suspension strap and ADLs in kitchen & bathroom. Rationale to progressing wear to all awake hours. PT recommended donning upon arising in morning, wearing 5hrs, redonning after afternoon shower and wear 5hrs close to bedtime.  Switch liners cleaning dirty one with bathing.    Current prosthetic wear tolerance (days/week)  daily    Current prosthetic wear tolerance (#hours/day)  6-8 hrs total / day    Education Provided Residual limb care;Prosthetic cleaning;Proper Donning;Proper wear schedule/adjustment;Other (comment)   see prosthetic care comments   Person(s) Educated Patient    Education Method Explanation;Demonstration;Tactile cues;Verbal cues    Education Method Verbalized  understanding;Returned demonstration;Tactile cues required;Verbal cues required;[name] further instruction    Donning Prosthesis Supervision                      PT Short Term Goals - 05/22/21 1610       PT SHORT TERM GOAL #1   Title Patient donnes prosthesis modified independent & verbalizes proper cleaning.    Time 1    Period Months    Status New    Target Date 06/28/21      PT SHORT TERM GOAL #2   Title Patient tolerates prosthesis >12 hrs total /day without skin issues or limb pain    Time 1    Period Months    Status New    Target Date 06/28/21      PT SHORT TERM GOAL #3   Title Patient able to pick up items from floor & reach 10" without UE support safely.    Time 1    Period Months    Status New    Target Date 06/28/21      PT SHORT TERM GOAL #4   Title Patient ambulates 200' with RW & prosthesis with supervision with proper prosthetic knee control.    Time 1    Period Months    Status New    Target Date 06/28/21      PT SHORT TERM GOAL #5   Title Patient negotiates ramps & curbs with RW & prosthesis with supervision.    Time 1    Period Months    Status New    Target Date 06/28/21               PT Long Term Goals - 05/22/21 1606       PT LONG TERM GOAL #1   Title Patient demonstrates & verbalized understanding of prosthetic care to enable safe utilization of prosthesis.    Time 10    Period Weeks    Status New    Target Date 08/09/21      PT LONG TERM GOAL #2   Title Patient tolerates prosthesis wear >90% of awake hours without skin or limb pain issues.    Time 10    Period Weeks    Status New  Target Date 08/09/21      PT LONG TERM GOAL #3   Title Berg Balance >/= 45/56 to indicate lower fall risk    Time 10    Period Weeks    Status New    Target Date 08/09/21      PT LONG TERM GOAL #4   Title Patient ambulates 500' outdoors with LRAD & prosthesis modified independent    Time 10    Period Weeks    Status New     Target Date 08/09/21      PT LONG TERM GOAL #5   Title Patient negotiates ramps, curbs & stairs with LRAD & prosthesis modified independent.    Time 10    Period Weeks    Status New    Target Date 08/09/21      Additional Long Term Goals   Additional Long Term Goals Yes      PT LONG TERM GOAL #6   Title Patient demo proper lifting, carrying up to 30#, push/pull task and climbing ladder for return to work skills.    Time 10    Period Weeks    Status New    Target Date 08/09/21                   Plan - 06/18/21 1007     Clinical Impression Statement PT instructed in locking prosthetic knee to be used with donning/tightening strap and ADLs in kitchen & bathroom. He appears to understand basics.  Pt improved with forearm crutches and would benefit from order for pair.  PT introduced using hydraulics to descend steps & he did well for first time.    Personal Factors and Comorbidities Comorbidity 3+;Fitness;Time since onset of injury/illness/exacerbation    Comorbidities PAD, gangrene s/p L AKA 11/06/20, HTN, CAD s/p STEMI with stenting 08/17/2020, and CHF. cryptogenic stroke Dec 2021    Examination-Activity Limitations Lift;Locomotion Level;Squat;Stairs;Stand;Transfers    Examination-Participation Restrictions Community Activity;Occupation    Stability/Clinical Decision Making Evolving/Moderate complexity    Rehab Potential Good    PT Frequency 2x / week    PT Duration Other (comment)   10 weeks   PT Treatment/Interventions ADLs/Self Care Home Management;DME Instruction;Gait training;Stair training;Functional mobility training;Therapeutic activities;Therapeutic exercise;Balance training;Neuromuscular re-education;Patient/family education;Prosthetic Training;Manual techniques;Passive range of motion    PT Next Visit Plan review prosthetic care,  prosthetic gait 4-point with axillary crutches including ramps & curbs,  work on using hydraulics to descend stairs.    Consulted and  Agree with Plan of Care Patient             Patient will benefit from skilled therapeutic intervention in order to improve the following deficits and impairments:  Abnormal gait, Decreased activity tolerance, Decreased balance, Decreased endurance, Decreased knowledge of use of DME, Decreased mobility, Decreased range of motion, Decreased strength, Increased edema, Postural dysfunction, Prosthetic Dependency, Pain  Visit Diagnosis: Unsteadiness on feet  Other abnormalities of gait and mobility  Muscle weakness (generalized)  Stiffness of left hip, not elsewhere classified  Abnormal posture  History of falling     Problem List Patient Active Problem List   Diagnosis Date Noted   Cryptogenic stroke ([place]) 05/10/2021   [employer] [id number] 05/10/2021 05/10/2021   AVM (arteriovenous malformation) 03/20/2021   Pulmonary AV (arteriovenous) fistula ([place]) 02/20/2021   Pre-ulcerative calluses 12/20/2020   Cerebral thrombosis with cerebral infarction 12/11/2020   Cerebrovascular accident (CVA) due to embolism of cerebral artery ([place]) 12/11/2020   Pressure injury of skin 11/12/2020  Acute urinary retention 11/09/2020   Normal anion gap metabolic acidosis 55/83/1674   Hypotension 11/07/2020   Chest pain 10/31/2020   Acute on chronic HFrEF (heart failure with reduced ejection fraction) ([place])    Goals of care, counseling/discussion    Palliative care by specialist    DNR (do not resuscitate)    Critical limb ischemia with history of revascularization of same extremity ([place]) 10/30/2020   Hyperglycemia 10/30/2020   Tobacco abuse 10/30/2020   Leukocytosis 10/30/2020   Chronic combined systolic and diastolic CHF (congestive heart failure) ([place]) 10/30/2020   Ischemic cardiomyopathy 10/24/2020   AKI (acute kidney injury) ([place]) 10/17/2020   PAD (peripheral artery disease) ([place]) 10/16/2020   Critical lower limb ischemia ([place]) 10/05/2020   Rest pain of left upper extremity due to  atherosclerosis ([place]) 10/05/2020   Severe claudication ([place]) 09/20/2020   Paresthesia of both lower extremities 09/20/2020   H/O cardiac arrest 09/20/2020   H/O acute myocardial infarction 08/14/2020   Snoring 08/14/2020   Sinus pause 08/14/2020   Mixed hyperlipidemia 08/14/2020   Tobacco dependence 08/14/2020   Coronary artery disease involving native coronary artery of native heart without angina pectoris 08/10/2020   HFrEF (heart failure with reduced ejection fraction) ([place]) 08/10/2020   Nonrheumatic mitral valve regurgitation 08/10/2020   STEMI (ST elevation myocardial infarction) ([place]) 08/02/2020   Accelerated hypertension 08/01/2020    Jamey Reas, PT, DPT 06/18/2021, 11:39 AM  [facility] [address] [place], [place], [address] Phone: [phone]   Fax:  [phone]  Name: Mike [name] MRN: [id number] Date of Birth: 10-14-1961

## 2021-06-20 ENCOUNTER — Ambulatory Visit (INDEPENDENT_AMBULATORY_CARE_PROVIDER_SITE_OTHER): Payer: Self-pay | Admitting: Physical Therapy

## 2021-06-20 ENCOUNTER — Telehealth: Payer: Self-pay | Admitting: Physical Therapy

## 2021-06-20 ENCOUNTER — Other Ambulatory Visit: Payer: Self-pay | Admitting: Orthopedic Surgery

## 2021-06-20 ENCOUNTER — Encounter: Payer: Self-pay | Admitting: Physical Therapy

## 2021-06-20 ENCOUNTER — Other Ambulatory Visit: Payer: Self-pay

## 2021-06-20 DIAGNOSIS — M25652 Stiffness of left hip, not elsewhere classified: Secondary | ICD-10-CM

## 2021-06-20 DIAGNOSIS — I5020 Unspecified systolic (congestive) heart failure: Secondary | ICD-10-CM

## 2021-06-20 DIAGNOSIS — M6281 Muscle weakness (generalized): Secondary | ICD-10-CM

## 2021-06-20 DIAGNOSIS — R2689 Other abnormalities of gait and mobility: Secondary | ICD-10-CM

## 2021-06-20 DIAGNOSIS — Z89612 Acquired absence of left leg above knee: Secondary | ICD-10-CM

## 2021-06-20 DIAGNOSIS — R293 Abnormal posture: Secondary | ICD-10-CM

## 2021-06-20 DIAGNOSIS — Z9181 History of falling: Secondary | ICD-10-CM

## 2021-06-20 DIAGNOSIS — R2681 Unsteadiness on feet: Secondary | ICD-10-CM

## 2021-06-20 MED ORDER — ENTRESTO 49-51 MG PO TABS: 1.0000 | ORAL_TABLET | Freq: Two times a day (BID) | ORAL | 2 refills | Status: DC

## 2021-06-20 NOTE — Therapy (Signed)
[facility] Therapy [address] [place], [place], [address] Phone: [phone]   Fax:  [phone]  Physical Therapy Treatment  Patient Details  Name: Mike [name] MRN: [id number] Date of Birth: [birth year] Referring Provider (PT): Meridee Score, MD   Encounter Date: 06/20/2021   PT End of Session - 06/20/21 0933     Visit Number 6    Number of Visits 21    Date for PT Re-Evaluation 08/09/21    Authorization Type BCBS    Authorization Time Period OOP & Deductible MET    PT Start Time 0930    PT Stop Time 2671    PT Time Calculation (min) 45 min    Equipment Utilized During Treatment Gait belt    Activity Tolerance Patient tolerated treatment well    Behavior During Therapy [facility] for tasks assessed/performed             Past Medical History:  Diagnosis Date   Anxiety    CAD (coronary artery disease)    Cryptogenic stroke ([place]) 05/10/2021   Heart attack ([place]) 08/01/2020   Heart failure with reduced ejection fraction ([place])    Hyperlipidemia    Hypertension    [employer] [id number] 05/10/2021 05/10/2021   PAD (peripheral artery disease) ([place])    right CIA & right distal iliac artery stents 10/16/20; left AKA 11/06/20   PFO (patent foramen ovale) 12/14/2020   Pneumonia    Pulmonary AV (arteriovenous) fistula ([place]) 12/14/2020   Stroke ([place]) 12/11/2020   no residuial effects    Past Surgical History:  Procedure Laterality Date   ABDOMINAL AORTOGRAM W/LOWER EXTREMITY N/A 10/16/2020   Procedure: ABDOMINAL AORTOGRAM W/LOWER EXTREMITY;  Surgeon: Nigel Mormon, MD;  Location: [place] [facility];  Service: Cardiovascular;  Laterality: N/A;   AMPUTATION Left 11/06/2020   Procedure: AMPUTATION ABOVE KNEE;  Surgeon: Rosetta Posner, MD;  Location: [place];  Service: Vascular;  Laterality: Left;   BUBBLE STUDY  12/14/2020   Procedure: BUBBLE STUDY;  Surgeon: Nigel Mormon, MD;  Location: [place];  Service: Cardiovascular;;   CARDIAC  CATHETERIZATION     CORONARY STENT INTERVENTION N/A 08/03/2020   Procedure: CORONARY STENT INTERVENTION;  Surgeon: Nigel Mormon, MD;  Location: [place] [facility];  Service: Cardiovascular;  Laterality: N/A;   CORONARY/GRAFT ACUTE MI REVASCULARIZATION N/A 08/01/2020   Procedure: Coronary/Graft Acute MI Revascularization;  Surgeon: Nigel Mormon, MD;  Location: [place] [facility];  Service: Cardiovascular;  Laterality: N/A;   INTRAVASCULAR LITHOTRIPSY Right 10/16/2020   Procedure: INTRAVASCULAR LITHOTRIPSY;  Surgeon: Nigel Mormon, MD;  Location: [place] [facility];  Service: Cardiovascular;  Laterality: Right;  Common and External Iliac   IR ANGIOGRAM PULMONARY LEFT SELECTIVE  03/20/2021   IR ANGIOGRAM SELECTIVE EACH ADDITIONAL VESSEL  03/20/2021   IR EMBO ARTERIAL NOT HEMORR HEMANG INC GUIDE ROADMAPPING  03/20/2021   IR RADIOLOGIST EVAL & MGMT  01/30/2021   IR RADIOLOGIST EVAL & MGMT  04/18/2021   IR US GUIDE VASC ACCESS RIGHT  03/20/2021   LEFT HEART CATH AND CORONARY ANGIOGRAPHY N/A 08/01/2020   Procedure: LEFT HEART CATH AND CORONARY ANGIOGRAPHY;  Surgeon: Nigel Mormon, MD;  Location: [place] [facility];  Service: Cardiovascular;  Laterality: N/A;   LEFT HEART CATH AND CORONARY ANGIOGRAPHY N/A 08/03/2020   Procedure: LEFT HEART CATH AND CORONARY ANGIOGRAPHY;  Surgeon: Nigel Mormon, MD;  Location: [place] [facility];  Service: Cardiovascular;  Laterality: N/A;   PERIPHERAL VASCULAR INTERVENTION  Right 10/16/2020   Procedure: PERIPHERAL VASCULAR INTERVENTION;  Surgeon: Nigel Mormon, MD;  Location: [place] [facility];  Service: Cardiovascular;  Laterality: Right;  Common and Iliac Stent   RADIOLOGY WITH ANESTHESIA N/A 03/20/2021   Procedure: PULMONARY EMBOLIZATION;  Surgeon: Aletta Edouard, MD;  Location: [place];  Service: Radiology;  Laterality: N/A;   TEE WITHOUT CARDIOVERSION N/A 12/14/2020   Procedure: TRANSESOPHAGEAL ECHOCARDIOGRAM (TEE);  Surgeon: Nigel Mormon, MD;  Location: [facility];  Service: Cardiovascular;  Laterality: N/A;    There were no vitals filed for this visit.   Subjective Assessment - 06/20/21 0933     Subjective He tried some things in kitchen with knee locked with no falls. It was akward but could do it.    Pertinent History PAD, gangrene s/p L AKA 11/06/20, HTN, CAD s/p STEMI with stenting 08/17/2020, and CHF. cryptogenic stroke Dec 2021    Patient Stated Goals to return to work, (he was released from current job over last weekend but wants to find job).  to walk in community,    Currently in Pain? Yes    Pain Score 8     Pain Location Leg    Pain Orientation Left    Pain Descriptors / Indicators Throbbing    Pain Type Phantom pain    Pain Onset More than a month ago    Pain Frequency Intermittent    Aggravating Factors  unknown, comes back when tylenol.    Pain Relieving Factors tylenol                               [facility] Adult PT Treatment/Exercise - 06/20/21 0930       Transfers   Transfers Sit to Stand;Stand to Sit    Sit to Stand 5: Supervision;With upper extremity assist;From chair/3-in-1   from chairs with then without armrests using UEs   Sit to Stand Details Visual cues for safe use of DME/AE;Verbal cues for technique;Verbal cues for safe use of DME/AE    Stand to Sit 5: Supervision;With upper extremity assist;With armrests;To chair/3-in-1   to chairs with then without armrests using UEs   Stand to Sit Details (indicate cue type and reason) Visual cues for safe use of DME/AE;Verbal cues for safe use of DME/AE;Verbal cues for technique      Ambulation/Gait   Ambulation/Gait Yes    Ambulation/Gait Assistance 4: Min guard;5: Supervision    Ambulation/Gait Assistance Details Minimal verbal cues for sequence initially.    Ambulation Distance (Feet) 120 Feet   X2   Assistive device Lofstrands;Prosthesis    Ambulation Surface Level;Indoor    Stairs Yes    Stairs Assistance 5:  Supervision    Stairs Assistance Details (indicate cue type and reason) PT verbal cues on "riding" prosthetic hydraulics to descend stairs step pattern.    Stair Management Technique Two rails;Step to pattern;Forwards    Number of Stairs 11    Height of Stairs 6    Ramp 4: Min assist   forearm crutches & TFA prosthesis   Ramp Details (indicate cue type and reason) 3-pt pattern with cues on technique including step length & wt shift over prosthesis in stance.    Curb 5: Supervision   Min guard with forearm crutches & TFA prosthesis.   Curb Details (indicate cue type and reason) PT demo, verbal & tactile cues on "riding" prosthetic hydraulics to descend stairs step pattern.  Self-Care   Self-Care --    ADL's --      Exercises   Exercises Knee/Hip      Knee/Hip Exercises: Aerobic   Nustep Seat 11 Level 5 with BLEs & BUEs for 8 min.  PT educated  on benefits & availibliity in community.  pt verbalized understanding.      Prosthetics   Prosthetic Care Comments  --    Current prosthetic wear tolerance (days/week)  daily    Current prosthetic wear tolerance (#hours/day)  6-8 hrs total / day    Education Provided --   see prosthetic care comments                     PT Short Term Goals - 05/22/21 1610       PT SHORT TERM GOAL #1   Title Patient donnes prosthesis modified independent & verbalizes proper cleaning.    Time 1    Period Months    Status New    Target Date 06/28/21      PT SHORT TERM GOAL #2   Title Patient tolerates prosthesis >12 hrs total /day without skin issues or limb pain    Time 1    Period Months    Status New    Target Date 06/28/21      PT SHORT TERM GOAL #3   Title Patient able to pick up items from floor & reach 10" without UE support safely.    Time 1    Period Months    Status New    Target Date 06/28/21      PT SHORT TERM GOAL #4   Title Patient ambulates 200' with RW & prosthesis with supervision with proper prosthetic knee  control.    Time 1    Period Months    Status New    Target Date 06/28/21      PT SHORT TERM GOAL #5   Title Patient negotiates ramps & curbs with RW & prosthesis with supervision.    Time 1    Period Months    Status New    Target Date 06/28/21               PT Long Term Goals - 05/22/21 1606       PT LONG TERM GOAL #1   Title Patient demonstrates & verbalized understanding of prosthetic care to enable safe utilization of prosthesis.    Time 10    Period Weeks    Status New    Target Date 08/09/21      PT LONG TERM GOAL #2   Title Patient tolerates prosthesis wear >90% of awake hours without skin or limb pain issues.    Time 10    Period Weeks    Status New    Target Date 08/09/21      PT LONG TERM GOAL #3   Title Berg Balance >/= 45/56 to indicate lower fall risk    Time 10    Period Weeks    Status New    Target Date 08/09/21      PT LONG TERM GOAL #4   Title Patient ambulates 500' outdoors with LRAD & prosthesis modified independent    Time 10    Period Weeks    Status New    Target Date 08/09/21      PT LONG TERM GOAL #5   Title Patient negotiates ramps, curbs & stairs with LRAD & prosthesis modified independent.    Time  10    Period Weeks    Status New    Target Date 08/09/21      Additional Long Term Goals   Additional Long Term Goals Yes      PT LONG TERM GOAL #6   Title Patient demo proper lifting, carrying up to 30#, push/pull task and climbing ladder for return to work skills.    Time 10    Period Weeks    Status New    Target Date 08/09/21                   Plan - 06/20/21 0933     Clinical Impression Statement PT requested appt with Dr. Sharol Given to address phantom pain.  PT instructed in use of recumbent stepper to work on endurance at lower intensity so he will last longer.  Pt improved prosthetic gait with forearm crutches.    Personal Factors and Comorbidities Comorbidity 3+;Fitness;Time since onset of  injury/illness/exacerbation    Comorbidities PAD, gangrene s/p L AKA 11/06/20, HTN, CAD s/p STEMI with stenting 08/17/2020, and CHF. cryptogenic stroke Dec 2021    Examination-Activity Limitations Lift;Locomotion Level;Squat;Stairs;Stand;Transfers    Examination-Participation Restrictions Community Activity;Occupation    Stability/Clinical Decision Making Evolving/Moderate complexity    Rehab Potential Good    PT Frequency 2x / week    PT Duration Other (comment)   10 weeks   PT Treatment/Interventions ADLs/Self Care Home Management;DME Instruction;Gait training;Stair training;Functional mobility training;Therapeutic activities;Therapeutic exercise;Balance training;Neuromuscular re-education;Patient/family education;Prosthetic Training;Manual techniques;Passive range of motion    PT Next Visit Plan check STGs,  review prosthetic care,  prosthetic gait 4-point with axillary crutches including ramps & curbs,  work on using hydraulics to descend stairs.    Consulted and Agree with Plan of Care Patient             Patient will benefit from skilled therapeutic intervention in order to improve the following deficits and impairments:  Abnormal gait, Decreased activity tolerance, Decreased balance, Decreased endurance, Decreased knowledge of use of DME, Decreased mobility, Decreased range of motion, Decreased strength, Increased edema, Postural dysfunction, Prosthetic Dependency, Pain  Visit Diagnosis: Unsteadiness on feet  Other abnormalities of gait and mobility  Muscle weakness (generalized)  Stiffness of left hip, not elsewhere classified  Abnormal posture  History of falling     Problem List Patient Active Problem List   Diagnosis Date Noted   Cryptogenic stroke ([place]) 05/10/2021   [employer] [id number] 05/10/2021 05/10/2021   AVM (arteriovenous malformation) 03/20/2021   Pulmonary AV (arteriovenous) fistula ([place]) 02/20/2021   Pre-ulcerative calluses 12/20/2020   Cerebral  thrombosis with cerebral infarction 12/11/2020   Cerebrovascular accident (CVA) due to embolism of cerebral artery ([place]) 12/11/2020   Pressure injury of skin 11/12/2020   Acute urinary retention 11/09/2020   Normal anion gap metabolic acidosis 27/02/5008   Hypotension 11/07/2020   Chest pain 10/31/2020   Acute on chronic HFrEF (heart failure with reduced ejection fraction) ([place])    Goals of care, counseling/discussion    Palliative care by specialist    DNR (do not resuscitate)    Critical limb ischemia with history of revascularization of same extremity ([place]) 10/30/2020   Hyperglycemia 10/30/2020   Tobacco abuse 10/30/2020   Leukocytosis 10/30/2020   Chronic combined systolic and diastolic CHF (congestive heart failure) ([place]) 10/30/2020   Ischemic cardiomyopathy 10/24/2020   AKI (acute kidney injury) ([place]) 10/17/2020   PAD (peripheral artery disease) ([place]) 10/16/2020   Critical lower limb ischemia ([place]) 10/05/2020   Rest pain  of left upper extremity due to atherosclerosis ([place]) 10/05/2020   Severe claudication ([place]) 09/20/2020   Paresthesia of both lower extremities 09/20/2020   H/O cardiac arrest 09/20/2020   H/O acute myocardial infarction 08/14/2020   Snoring 08/14/2020   Sinus pause 08/14/2020   Mixed hyperlipidemia 08/14/2020   Tobacco dependence 08/14/2020   Coronary artery disease involving native coronary artery of native heart without angina pectoris 08/10/2020   HFrEF (heart failure with reduced ejection fraction) ([place]) 08/10/2020   Nonrheumatic mitral valve regurgitation 08/10/2020   STEMI (ST elevation myocardial infarction) ([place]) 08/02/2020   Accelerated hypertension 08/01/2020    Jamey Reas, PT, DPT 06/20/2021, 10:55 AM  [facility] Physical Therapy [address] [place], [place], [address] Phone: [phone]   Fax:  [phone]  Name: Mike [name] MRN: [id number] Date of Birth: August 22, 1961

## 2021-06-20 NOTE — Telephone Encounter (Signed)
Can you call pt and make an appt with erin or Maryanne?  Order has been placed for Forearm crutches through [employer] as requested by therapy.

## 2021-06-20 NOTE — Telephone Encounter (Signed)
I called pt and he stated he will CB after he checks his schedule.  [phone]

## 2021-06-20 NOTE — Telephone Encounter (Signed)
Mike [name] is having increased issues with phantom pain.  He does not have any future appts scheduled with you. Can you schedule him an appt to address this?  Also he is using forearm crutches for gait. Can you please place an order for tall adult forearm crutches?  Thank you  Shirlean Mylar

## 2021-06-21 ENCOUNTER — Telehealth: Payer: Self-pay

## 2021-06-21 NOTE — Telephone Encounter (Signed)
I placed the order for the forearm crutches through [employer] and the pt's insurance has termed as of 05/29/21. He will pick up in retail and pay for them. His [employer] has not kicked in yet. The front desk has reached out for an appt and he had asked to call back to schedule.

## 2021-06-24 ENCOUNTER — Encounter: Payer: Self-pay | Admitting: Physical Therapy

## 2021-06-24 ENCOUNTER — Other Ambulatory Visit: Payer: Self-pay

## 2021-06-24 ENCOUNTER — Ambulatory Visit (INDEPENDENT_AMBULATORY_CARE_PROVIDER_SITE_OTHER): Payer: Self-pay | Admitting: Physical Therapy

## 2021-06-24 DIAGNOSIS — R2681 Unsteadiness on feet: Secondary | ICD-10-CM

## 2021-06-24 DIAGNOSIS — I5020 Unspecified systolic (congestive) heart failure: Secondary | ICD-10-CM

## 2021-06-24 DIAGNOSIS — R2689 Other abnormalities of gait and mobility: Secondary | ICD-10-CM

## 2021-06-24 DIAGNOSIS — M25652 Stiffness of left hip, not elsewhere classified: Secondary | ICD-10-CM

## 2021-06-24 DIAGNOSIS — M6281 Muscle weakness (generalized): Secondary | ICD-10-CM

## 2021-06-24 DIAGNOSIS — R293 Abnormal posture: Secondary | ICD-10-CM

## 2021-06-24 MED ORDER — ENTRESTO 49-51 MG PO TABS: 1.0000 | ORAL_TABLET | Freq: Two times a day (BID) | ORAL | 2 refills | Status: DC

## 2021-06-24 NOTE — Therapy (Signed)
[facility] [address] [place], [place], [address] Phone: [phone]   Fax:  [phone]  Physical Therapy Treatment  Patient Details  Name: SAMARI GORBY MRN: [id number] Date of Birth: 1961/04/18 Referring Provider (PT): Meridee Score, MD   Encounter Date: 06/24/2021   PT End of Session - 06/24/21 1427     Visit Number 7    Number of Visits 21    Date for PT Re-Evaluation 08/09/21    Authorization Type BCBS    Authorization Time Period OOP & Deductible MET    PT Start Time 8115    PT Stop Time 1510    PT Time Calculation (min) 45 min    Equipment Utilized During Treatment Gait belt    Activity Tolerance Patient tolerated treatment well    Behavior During Therapy [facility] for tasks assessed/performed             Past Medical History:  Diagnosis Date   Anxiety    CAD (coronary artery disease)    Cryptogenic stroke ([place]) 05/10/2021   Heart attack ([place]) 08/01/2020   Heart failure with reduced ejection fraction ([place])    Hyperlipidemia    Hypertension    [employer] [id number] 05/10/2021 05/10/2021   PAD (peripheral artery disease) ([place])    right CIA & right distal iliac artery stents 10/16/20; left AKA 11/06/20   PFO (patent foramen ovale) 12/14/2020   Pneumonia    Pulmonary AV (arteriovenous) fistula ([place]) 12/14/2020   Stroke ([place]) 12/11/2020   no residuial effects    Past Surgical History:  Procedure Laterality Date   ABDOMINAL AORTOGRAM W/LOWER EXTREMITY N/A 10/16/2020   Procedure: ABDOMINAL AORTOGRAM W/LOWER EXTREMITY;  Surgeon: Nigel Mormon, MD;  Location: [place] [facility];  Service: Cardiovascular;  Laterality: N/A;   AMPUTATION Left 11/06/2020   Procedure: AMPUTATION ABOVE KNEE;  Surgeon: Rosetta Posner, MD;  Location: [place];  Service: Vascular;  Laterality: Left;   BUBBLE STUDY  12/14/2020   Procedure: BUBBLE STUDY;  Surgeon: Nigel Mormon, MD;  Location: [place];  Service: Cardiovascular;;   CARDIAC  CATHETERIZATION     CORONARY STENT INTERVENTION N/A 08/03/2020   Procedure: CORONARY STENT INTERVENTION;  Surgeon: Nigel Mormon, MD;  Location: [place] [facility];  Service: Cardiovascular;  Laterality: N/A;   CORONARY/GRAFT ACUTE MI REVASCULARIZATION N/A 08/01/2020   Procedure: Coronary/Graft Acute MI Revascularization;  Surgeon: Nigel Mormon, MD;  Location: [place] [facility];  Service: Cardiovascular;  Laterality: N/A;   INTRAVASCULAR LITHOTRIPSY Right 10/16/2020   Procedure: INTRAVASCULAR LITHOTRIPSY;  Surgeon: Nigel Mormon, MD;  Location: [place] [facility];  Service: Cardiovascular;  Laterality: Right;  Common and External Iliac   IR ANGIOGRAM PULMONARY LEFT SELECTIVE  03/20/2021   IR ANGIOGRAM SELECTIVE EACH ADDITIONAL VESSEL  03/20/2021   IR EMBO ARTERIAL NOT HEMORR HEMANG INC GUIDE ROADMAPPING  03/20/2021   IR RADIOLOGIST EVAL & MGMT  01/30/2021   IR RADIOLOGIST EVAL & MGMT  04/18/2021   IR US GUIDE VASC ACCESS RIGHT  03/20/2021   LEFT HEART CATH AND CORONARY ANGIOGRAPHY N/A 08/01/2020   Procedure: LEFT HEART CATH AND CORONARY ANGIOGRAPHY;  Surgeon: Nigel Mormon, MD;  Location: [place] [facility];  Service: Cardiovascular;  Laterality: N/A;   LEFT HEART CATH AND CORONARY ANGIOGRAPHY N/A 08/03/2020   Procedure: LEFT HEART CATH AND CORONARY ANGIOGRAPHY;  Surgeon: Nigel Mormon, MD;  Location: [place] [facility];  Service: Cardiovascular;  Laterality: N/A;   PERIPHERAL VASCULAR INTERVENTION  Right 10/16/2020   Procedure: PERIPHERAL VASCULAR INTERVENTION;  Surgeon: Nigel Mormon, MD;  Location: [place] [facility];  Service: Cardiovascular;  Laterality: Right;  Common and Iliac Stent   RADIOLOGY WITH ANESTHESIA N/A 03/20/2021   Procedure: PULMONARY EMBOLIZATION;  Surgeon: Aletta Edouard, MD;  Location: [place];  Service: Radiology;  Laterality: N/A;   TEE WITHOUT CARDIOVERSION N/A 12/14/2020   Procedure: TRANSESOPHAGEAL ECHOCARDIOGRAM (TEE);  Surgeon: Nigel Mormon, MD;  Location: [facility];  Service: Cardiovascular;  Laterality: N/A;    There were no vitals filed for this visit.   Subjective Assessment - 06/24/21 1425     Subjective He is donning 30-60 min upon arising and close to 30-60 min of bedtime.  He is wearing prosthesis 3-5 hrs 2x/day.    Pertinent History PAD, gangrene s/p L AKA 11/06/20, HTN, CAD s/p STEMI with stenting 08/17/2020, and CHF. cryptogenic stroke Dec 2021    Patient Stated Goals to return to work, (he was released from current job over last weekend but wants to find job).  to walk in community,    Currently in Pain? No/denies    Pain Onset More than a month ago                               [facility] Adult PT Treatment/Exercise - 06/24/21 1425       Transfers   Transfers Sit to Stand;Stand to Sit    Sit to Stand 5: Supervision;With upper extremity assist;From chair/3-in-1   without armrests using UEs on chair not touching //bars to stabiize   Sit to Stand Details Verbal cues for technique;Verbal cues for safe use of DME/AE    Sit to Stand Details (indicate cue type and reason) 5 reps 2 sets    Stand to Sit 5: Supervision;With upper extremity assist;To chair/3-in-1   to chairs without armrests using UEs on chair, not touching //bars for stability   Stand to Sit Details (indicate cue type and reason) Verbal cues for safe use of DME/AE;Verbal cues for technique    Stand to Sit Details 5 reps 2 sets      Ambulation/Gait   Ambulation/Gait Yes    Ambulation/Gait Assistance 5: Supervision;3: Mod assist;4: Min guard   supervision 2 crutches, min guard single rail in //bars, ModA single crutch + HHA   Ambulation/Gait Assistance Details verbal cues on looking forward /not staring at floor for upright posture, wt shift over prosthesis in stance & sequence with single crutch    Ambulation Distance (Feet) 70 Feet   70' 2 crutches, 30' total single rail in //bars & 50' right crutch/left HHA   Assistive device  Lofstrands;Prosthesis;R Forearm Crutch;1 person hand held assist    Ambulation Surface Level;Indoor    Height of Stairs 6    Ramp 5: Supervision   forearm crutches & TFA prosthesis   Ramp Details (indicate cue type and reason) verbal cues on technique.    Curb 5: Supervision   with forearm crutches & TFA prosthesis.   Curb Details (indicate cue type and reason) verbal cues on technique.      High Level Balance   High Level Balance Activities Side stepping;Turns;Other (comment)   90* turns from stationary stance   High Level Balance Comments demo, verbal & tactile cues on technique including engaging prosthesis.      Neuro Re-ed    Neuro Re-ed Details  standing in //bars without UE support - green  theraband 10 reps ea - alternating & BUEs forward reach, rows, & overhead reach.      Exercises   Exercises Knee/Hip      Knee/Hip Exercises: Aerobic   Nustep Seat 11 Level 5 with BLEs & BUEs for 10 min.  PT educated  on benefits & availibliity in community.  pt verbalized understanding.      Prosthetics   Current prosthetic wear tolerance (days/week)  daily    Current prosthetic wear tolerance (#hours/day)  6-8 hrs total / day between 2 wears.    Education Provided --   see prosthetic care comments                Balance Exercises - 06/24/21 1425       Balance Exercises: Standing   Standing Eyes Closed Head turns;Solid surface;5 reps   hip width stance, head turns 4 directions   Standing Eyes Closed Limitations tactile cues for balance reactions.                 PT Short Term Goals - 06/24/21 1522       PT SHORT TERM GOAL #1   Title Patient donnes prosthesis modified independent & verbalizes proper cleaning.    Time 1    Period Months    Status Achieved    Target Date 06/28/21      PT SHORT TERM GOAL #2   Title Patient tolerates prosthesis >12 hrs total /day without skin issues or limb pain    Time 1    Period Months    Status Not Met    Target Date 06/28/21       PT SHORT TERM GOAL #3   Title Patient able to pick up items from floor & reach 10" without UE support safely.    Time 1    Period Months    Status Achieved    Target Date 06/28/21      PT SHORT TERM GOAL #4   Title Patient ambulates 200' with RW & prosthesis with supervision with proper prosthetic knee control.    Time 1    Period Months    Status Achieved    Target Date 06/28/21      PT SHORT TERM GOAL #5   Title Patient negotiates ramps & curbs with RW & prosthesis with supervision.    Time 1    Period Months    Status Achieved    Target Date 06/28/21               PT Long Term Goals - 05/22/21 1606       PT LONG TERM GOAL #1   Title Patient demonstrates & verbalized understanding of prosthetic care to enable safe utilization of prosthesis.    Time 10    Period Weeks    Status New    Target Date 08/09/21      PT LONG TERM GOAL #2   Title Patient tolerates prosthesis wear >90% of awake hours without skin or limb pain issues.    Time 10    Period Weeks    Status New    Target Date 08/09/21      PT LONG TERM GOAL #3   Title Berg Balance >/= 45/56 to indicate lower fall risk    Time 10    Period Weeks    Status New    Target Date 08/09/21      PT LONG TERM GOAL #4   Title Patient ambulates 500' outdoors with LRAD &  prosthesis modified independent    Time 10    Period Weeks    Status New    Target Date 08/09/21      PT LONG TERM GOAL #5   Title Patient negotiates ramps, curbs & stairs with LRAD & prosthesis modified independent.    Time 10    Period Weeks    Status New    Target Date 08/09/21      Additional Long Term Goals   Additional Long Term Goals Yes      PT LONG TERM GOAL #6   Title Patient demo proper lifting, carrying up to 30#, push/pull task and climbing ladder for return to work skills.    Time 10    Period Weeks    Status New    Target Date 08/09/21                   Plan - 06/24/21 1428     Clinical Impression  Statement PT progressed standing activities to include UE resistive to facilitate core & LE stabilization and side stepping progressed into 90* turns. He improved with instruction & repetition.  PT introduced prosthetic gait with RUE single crutch.  He required HHA with LUE.    Personal Factors and Comorbidities Comorbidity 3+;Fitness;Time since onset of injury/illness/exacerbation    Comorbidities PAD, gangrene s/p L AKA 11/06/20, HTN, CAD s/p STEMI with stenting 08/17/2020, and CHF. cryptogenic stroke Dec 2021    Examination-Activity Limitations Lift;Locomotion Level;Squat;Stairs;Stand;Transfers    Examination-Participation Restrictions Community Activity;Occupation    Stability/Clinical Decision Making Evolving/Moderate complexity    Rehab Potential Good    PT Frequency 2x / week    PT Duration Other (comment)   10 weeks   PT Treatment/Interventions ADLs/Self Care Home Management;DME Instruction;Gait training;Stair training;Functional mobility training;Therapeutic activities;Therapeutic exercise;Balance training;Neuromuscular re-education;Patient/family education;Prosthetic Training;Manual techniques;Passive range of motion    PT Next Visit Plan review prosthetic care,  prosthetic gait 4-point with axillary crutches including ramps & curbs,  work on using hydraulics to descend stairs. standing balance. single crutch gait.    Consulted and Agree with Plan of Care Patient             Patient will benefit from skilled therapeutic intervention in order to improve the following deficits and impairments:  Abnormal gait, Decreased activity tolerance, Decreased balance, Decreased endurance, Decreased knowledge of use of DME, Decreased mobility, Decreased range of motion, Decreased strength, Increased edema, Postural dysfunction, Prosthetic Dependency, Pain  Visit Diagnosis: Unsteadiness on feet  Other abnormalities of gait and mobility  Muscle weakness (generalized)  Stiffness of left hip, not  elsewhere classified  Abnormal posture     Problem List Patient Active Problem List   Diagnosis Date Noted   Cryptogenic stroke ([place]) 05/10/2021   [employer] [id number] 05/10/2021 05/10/2021   AVM (arteriovenous malformation) 03/20/2021   Pulmonary AV (arteriovenous) fistula ([place]) 02/20/2021   Pre-ulcerative calluses 12/20/2020   Cerebral thrombosis with cerebral infarction 12/11/2020   Cerebrovascular accident (CVA) due to embolism of cerebral artery ([place]) 12/11/2020   Pressure injury of skin 11/12/2020   Acute urinary retention 11/09/2020   Normal anion gap metabolic acidosis 73/53/2992   Hypotension 11/07/2020   Chest pain 10/31/2020   Acute on chronic HFrEF (heart failure with reduced ejection fraction) ([place])    Goals of care, counseling/discussion    Palliative care by specialist    DNR (do not resuscitate)    Critical limb ischemia with history of revascularization of same extremity ([place]) 10/30/2020   Hyperglycemia 10/30/2020  Tobacco abuse 10/30/2020   Leukocytosis 10/30/2020   Chronic combined systolic and diastolic CHF (congestive heart failure) ([place]) 10/30/2020   Ischemic cardiomyopathy 10/24/2020   AKI (acute kidney injury) ([place]) 10/17/2020   PAD (peripheral artery disease) ([place]) 10/16/2020   Critical lower limb ischemia ([place]) 10/05/2020   Rest pain of left upper extremity due to atherosclerosis ([place]) 10/05/2020   Severe claudication ([place]) 09/20/2020   Paresthesia of both lower extremities 09/20/2020   H/O cardiac arrest 09/20/2020   H/O acute myocardial infarction 08/14/2020   Snoring 08/14/2020   Sinus pause 08/14/2020   Mixed hyperlipidemia 08/14/2020   Tobacco dependence 08/14/2020   Coronary artery disease involving native coronary artery of native heart without angina pectoris 08/10/2020   HFrEF (heart failure with reduced ejection fraction) ([place]) 08/10/2020   Nonrheumatic mitral valve regurgitation 08/10/2020   STEMI (ST elevation myocardial  infarction) ([place]) 08/02/2020   Accelerated hypertension 08/01/2020    Jamey Reas PT, DPT 06/24/2021, 3:26 PM  [facility] [address] [place], [place], [address] Phone: [phone]   Fax:  [phone]  Name: ARLON BLEIER MRN: [id number] Date of Birth: 04/08/1961

## 2021-06-26 ENCOUNTER — Encounter: Payer: Self-pay | Admitting: Physical Therapy

## 2021-06-26 ENCOUNTER — Ambulatory Visit (INDEPENDENT_AMBULATORY_CARE_PROVIDER_SITE_OTHER): Payer: Self-pay | Admitting: Physical Therapy

## 2021-06-26 ENCOUNTER — Other Ambulatory Visit: Payer: Self-pay

## 2021-06-26 DIAGNOSIS — M6281 Muscle weakness (generalized): Secondary | ICD-10-CM

## 2021-06-26 DIAGNOSIS — R2689 Other abnormalities of gait and mobility: Secondary | ICD-10-CM

## 2021-06-26 DIAGNOSIS — R2681 Unsteadiness on feet: Secondary | ICD-10-CM

## 2021-06-26 DIAGNOSIS — M25652 Stiffness of left hip, not elsewhere classified: Secondary | ICD-10-CM

## 2021-06-26 DIAGNOSIS — R293 Abnormal posture: Secondary | ICD-10-CM

## 2021-06-26 NOTE — Therapy (Signed)
[facility] Physical Therapy [address] [place], [place], [address] Phone: [phone]   Fax:  [phone]  Physical Therapy Treatment  Patient Details  Name: NASSER KU MRN: [id number] Date of Birth: [birth year] Referring Provider (PT): Meridee Score, MD   Encounter Date: 06/26/2021   PT End of Session - 06/26/21 0924     Visit Number 8    Number of Visits 21    Date for PT Re-Evaluation 08/09/21    Authorization Type BCBS    Authorization Time Period OOP & Deductible MET    PT Start Time 0925    PT Stop Time 1012    PT Time Calculation (min) 47 min    Equipment Utilized During Treatment Gait belt    Activity Tolerance Patient tolerated treatment well    Behavior During Therapy [facility] for tasks assessed/performed             Past Medical History:  Diagnosis Date   Anxiety    CAD (coronary artery disease)    Cryptogenic stroke ([place]) 05/10/2021   Heart attack ([place]) 08/01/2020   Heart failure with reduced ejection fraction ([place])    Hyperlipidemia    Hypertension    [employer] [id number] 05/10/2021 05/10/2021   PAD (peripheral artery disease) ([place])    right CIA & right distal iliac artery stents 10/16/20; left AKA 11/06/20   PFO (patent foramen ovale) 12/14/2020   Pneumonia    Pulmonary AV (arteriovenous) fistula ([place]) 12/14/2020   Stroke ([place]) 12/11/2020   no residuial effects    Past Surgical History:  Procedure Laterality Date   ABDOMINAL AORTOGRAM W/LOWER EXTREMITY N/A 10/16/2020   Procedure: ABDOMINAL AORTOGRAM W/LOWER EXTREMITY;  Surgeon: Nigel Mormon, MD;  Location: [place] [facility];  Service: Cardiovascular;  Laterality: N/A;   AMPUTATION Left 11/06/2020   Procedure: AMPUTATION ABOVE KNEE;  Surgeon: Rosetta Posner, MD;  Location: [place];  Service: Vascular;  Laterality: Left;   BUBBLE STUDY  12/14/2020   Procedure: BUBBLE STUDY;  Surgeon: Nigel Mormon, MD;  Location: [place];  Service: Cardiovascular;;   CARDIAC  CATHETERIZATION     CORONARY STENT INTERVENTION N/A 08/03/2020   Procedure: CORONARY STENT INTERVENTION;  Surgeon: Nigel Mormon, MD;  Location: [place] [facility];  Service: Cardiovascular;  Laterality: N/A;   CORONARY/GRAFT ACUTE MI REVASCULARIZATION N/A 08/01/2020   Procedure: Coronary/Graft Acute MI Revascularization;  Surgeon: Nigel Mormon, MD;  Location: [place] [facility];  Service: Cardiovascular;  Laterality: N/A;   INTRAVASCULAR LITHOTRIPSY Right 10/16/2020   Procedure: INTRAVASCULAR LITHOTRIPSY;  Surgeon: Nigel Mormon, MD;  Location: [place] [facility];  Service: Cardiovascular;  Laterality: Right;  Common and External Iliac   IR ANGIOGRAM PULMONARY LEFT SELECTIVE  03/20/2021   IR ANGIOGRAM SELECTIVE EACH ADDITIONAL VESSEL  03/20/2021   IR EMBO ARTERIAL NOT HEMORR HEMANG INC GUIDE ROADMAPPING  03/20/2021   IR RADIOLOGIST EVAL & MGMT  01/30/2021   IR RADIOLOGIST EVAL & MGMT  04/18/2021   IR US GUIDE VASC ACCESS RIGHT  03/20/2021   LEFT HEART CATH AND CORONARY ANGIOGRAPHY N/A 08/01/2020   Procedure: LEFT HEART CATH AND CORONARY ANGIOGRAPHY;  Surgeon: Nigel Mormon, MD;  Location: [place] [facility];  Service: Cardiovascular;  Laterality: N/A;   LEFT HEART CATH AND CORONARY ANGIOGRAPHY N/A 08/03/2020   Procedure: LEFT HEART CATH AND CORONARY ANGIOGRAPHY;  Surgeon: Nigel Mormon, MD;  Location: [place] [facility];  Service: Cardiovascular;  Laterality: N/A;   PERIPHERAL VASCULAR INTERVENTION  Right 10/16/2020   Procedure: PERIPHERAL VASCULAR INTERVENTION;  Surgeon: Nigel Mormon, MD;  Location: [place] [facility];  Service: Cardiovascular;  Laterality: Right;  Common and Iliac Stent   RADIOLOGY WITH ANESTHESIA N/A 03/20/2021   Procedure: PULMONARY EMBOLIZATION;  Surgeon: Aletta Edouard, MD;  Location: [place];  Service: Radiology;  Laterality: N/A;   TEE WITHOUT CARDIOVERSION N/A 12/14/2020   Procedure: TRANSESOPHAGEAL ECHOCARDIOGRAM (TEE);  Surgeon: Nigel Mormon, MD;  Location: [facility];  Service: Cardiovascular;  Laterality: N/A;    There were no vitals filed for this visit.   Subjective Assessment - 06/26/21 0925     Subjective He wore prosthesis 8.5hrs total between 2 wears yesterday.    Pertinent History PAD, gangrene s/p L AKA 11/06/20, HTN, CAD s/p STEMI with stenting 08/17/2020, and CHF. cryptogenic stroke Dec 2021    Patient Stated Goals to return to work, (he was released from current job over last weekend but wants to find job).  to walk in community,    Currently in Pain? No/denies    Pain Onset More than a month ago                               [facility] Adult PT Treatment/Exercise - 06/26/21 0925       Transfers   Transfers Sit to Stand;Stand to Sit    Sit to Stand 5: Supervision;With upper extremity assist;From chair/3-in-1   without armrests using UEs on chair not touching //bars to stabiize   Sit to Stand Details Verbal cues for technique;Verbal cues for safe use of DME/AE    Stand to Sit 5: Supervision;With upper extremity assist;To chair/3-in-1   to chairs without armrests using UEs on chair, not touching //bars for stability   Stand to Sit Details (indicate cue type and reason) Verbal cues for safe use of DME/AE;Verbal cues for technique      Ambulation/Gait   Ambulation/Gait Yes    Ambulation/Gait Assistance 5: Supervision;3: Mod assist;4: Min guard   supervision 2 crutches, min guard single rail in //bars, ModA single crutch + HHA   Ambulation/Gait Assistance Details verbal, visual & tactile cues on pelvic weight shift over prosthesis in stance.    Ambulation Distance (Feet) 150 Feet   150' + ramp & curb w/ 2 crutches, 30' total single rail in //bars & 50' right crutch/left HHA   Assistive device Lofstrands;Prosthesis;R Forearm Crutch;1 person hand held assist    Ambulation Surface Level;Indoor    Height of Stairs --    Ramp 5: Supervision   forearm crutches & TFA prosthesis   Ramp Details  (indicate cue type and reason) verbal cues on technique.    Curb 5: Supervision   with forearm crutches & TFA prosthesis.   Curb Details (indicate cue type and reason) verbal cues on technique.      High Level Balance   High Level Balance Activities Side stepping;Turns;Other (comment);Backward walking   90* turns from stationary stance   High Level Balance Comments in //bars with BUE support,  demo, verbal & tactile cues on technique including engaging prosthesis.      Therapeutic Activites    Therapeutic Activities Lifting    Lifting picking up light object from floor 3 reps with chair behind & RW anterior but not touching.      Neuro Re-ed    Neuro Re-ed Details  standing in //bars without UE support - green theraband 10 reps ea -  alternating & BUEs forward reach, rows, & overhead reach.      Exercises   Exercises Knee/Hip      Knee/Hip Exercises: Aerobic   Nustep --      Prosthetics   Prosthetic Care Comments  PT verbally reviewed adjusting ply socks. PT recommending donning prosthesis upon arising, removing for 2-3 hours midday, then 2nd wear until bedtime.    Current prosthetic wear tolerance (days/week)  daily    Current prosthetic wear tolerance (#hours/day)  8.5 hrs total / day between 2 wears.    Education Provided Proper wear schedule/adjustment;Correct ply sock adjustment;Other (comment)   see prosthetic care comments   Person(s) Educated Patient    Education Method Explanation;Verbal cues    Education Method Verbalized understanding;Verbal cues required;Needs further instruction                 Balance Exercises - 06/26/21 0930       Balance Exercises: Standing   Standing Eyes Opened Wide ([place]);Solid surface;5 reps;Head turns   head turns 4 directions   Standing Eyes Opened Limitations mirror, tactile & verbal cues for midline equal weight bearing at midpoint.    Standing Eyes Closed Wide (BOA);Solid surface;3 reps;10 secs    Standing Eyes Closed Limitations  visual for midline equal weight prior to & after closing eyes.                 PT Short Term Goals - 06/26/21 0926       PT SHORT TERM GOAL #1   Title Patient verbalizes proper adjustment of ply socks.    Time 1    Period Months    Status New    Target Date 06/28/21      PT SHORT TERM GOAL #2   Title Patient tolerates prosthesis >12 hrs total /day without skin issues or limb pain    Time 1    Period Months    Status On-going    Target Date 06/28/21      PT SHORT TERM GOAL #3   Title Patient able to scan right/left & up/down maintaining equal weight bearing BLEs.    Time 1    Period Months    Status Revised    Target Date 06/28/21      PT SHORT TERM GOAL #4   Title Patient ambulates 200' with forearm crutches & prosthesis with supervision with proper prosthetic knee control.    Time 1    Period Months    Status Revised    Target Date 06/28/21      PT SHORT TERM GOAL #5   Title Patient negotiates ramps & curbs with forearm crutches & prosthesis with supervision.    Time 1    Period Months    Status Revised    Target Date 06/28/21               PT Long Term Goals - 05/22/21 1606       PT LONG TERM GOAL #1   Title Patient demonstrates & verbalized understanding of prosthetic care to enable safe utilization of prosthesis.    Time 10    Period Weeks    Status New    Target Date 08/09/21      PT LONG TERM GOAL #2   Title Patient tolerates prosthesis wear >90% of awake hours without skin or limb pain issues.    Time 10    Period Weeks    Status New    Target Date 08/09/21  PT LONG TERM GOAL #3   Title Berg Balance >/= 45/56 to indicate lower fall risk    Time 10    Period Weeks    Status New    Target Date 08/09/21      PT LONG TERM GOAL #4   Title Patient ambulates 500' outdoors with LRAD & prosthesis modified independent    Time 10    Period Weeks    Status New    Target Date 08/09/21      PT LONG TERM GOAL #5   Title Patient  negotiates ramps, curbs & stairs with LRAD & prosthesis modified independent.    Time 10    Period Weeks    Status New    Target Date 08/09/21      Additional Long Term Goals   Additional Long Term Goals Yes      PT LONG TERM GOAL #6   Title Patient demo proper lifting, carrying up to 30#, push/pull task and climbing ladder for return to work skills.    Time 10    Period Weeks    Status New    Target Date 08/09/21                   Plan - 06/26/21 6226     Clinical Impression Statement Patient is slowly but steadily improving balance & gait with prosthesis.  He is on target to meet STGs.    Personal Factors and Comorbidities Comorbidity 3+;Fitness;Time since onset of injury/illness/exacerbation    Comorbidities PAD, gangrene s/p L AKA 11/06/20, HTN, CAD s/p STEMI with stenting 08/17/2020, and CHF. cryptogenic stroke Dec 2021    Examination-Activity Limitations Lift;Locomotion Level;Squat;Stairs;Stand;Transfers    Examination-Participation Restrictions Community Activity;Occupation    Stability/Clinical Decision Making Evolving/Moderate complexity    Rehab Potential Good    PT Frequency 2x / week    PT Duration Other (comment)   10 weeks   PT Treatment/Interventions ADLs/Self Care Home Management;DME Instruction;Gait training;Stair training;Functional mobility training;Therapeutic activities;Therapeutic exercise;Balance training;Neuromuscular re-education;Patient/family education;Prosthetic Training;Manual techniques;Passive range of motion    PT Next Visit Plan review prosthetic care,  prosthetic gait 4-point with axillary crutches including ramps & curbs,  work on using hydraulics to descend stairs. standing balance. single crutch gait.    Consulted and Agree with Plan of Care Patient             Patient will benefit from skilled therapeutic intervention in order to improve the following deficits and impairments:  Abnormal gait, Decreased activity tolerance, Decreased  balance, Decreased endurance, Decreased knowledge of use of DME, Decreased mobility, Decreased range of motion, Decreased strength, Increased edema, Postural dysfunction, Prosthetic Dependency, Pain  Visit Diagnosis: Unsteadiness on feet  Other abnormalities of gait and mobility  Muscle weakness (generalized)  Stiffness of left hip, not elsewhere classified  Abnormal posture     Problem List Patient Active Problem List   Diagnosis Date Noted   Cryptogenic stroke ([place]) 05/10/2021   [employer] [id number] 05/10/2021 05/10/2021   AVM (arteriovenous malformation) 03/20/2021   Pulmonary AV (arteriovenous) fistula ([place]) 02/20/2021   Pre-ulcerative calluses 12/20/2020   Cerebral thrombosis with cerebral infarction 12/11/2020   Cerebrovascular accident (CVA) due to embolism of cerebral artery ([place]) 12/11/2020   Pressure injury of skin 11/12/2020   Acute urinary retention 11/09/2020   Normal anion gap metabolic acidosis 56/38/9373   Hypotension 11/07/2020   Chest pain 10/31/2020   Acute on chronic HFrEF (heart failure with reduced ejection fraction) ([place])    Goals of  care, counseling/discussion    Palliative care by specialist    DNR (do not resuscitate)    Critical limb ischemia with history of revascularization of same extremity ([place]) 10/30/2020   Hyperglycemia 10/30/2020   Tobacco abuse 10/30/2020   Leukocytosis 10/30/2020   Chronic combined systolic and diastolic CHF (congestive heart failure) ([place]) 10/30/2020   Ischemic cardiomyopathy 10/24/2020   AKI (acute kidney injury) ([place]) 10/17/2020   PAD (peripheral artery disease) ([place]) 10/16/2020   Critical lower limb ischemia ([place]) 10/05/2020   Rest pain of left upper extremity due to atherosclerosis ([place]) 10/05/2020   Severe claudication ([place]) 09/20/2020   Paresthesia of both lower extremities 09/20/2020   H/O cardiac arrest 09/20/2020   H/O acute myocardial infarction 08/14/2020   Snoring 08/14/2020   Sinus pause 08/14/2020    Mixed hyperlipidemia 08/14/2020   Tobacco dependence 08/14/2020   Coronary artery disease involving native coronary artery of native heart without angina pectoris 08/10/2020   HFrEF (heart failure with reduced ejection fraction) ([place]) 08/10/2020   Nonrheumatic mitral valve regurgitation 08/10/2020   STEMI (ST elevation myocardial infarction) ([place]) 08/02/2020   Accelerated hypertension 08/01/2020    Jamey Reas, PT, DPT 06/26/2021, 10:23 AM  [facility] Physical Therapy [address] [place], [place], [address] Phone: [phone]   Fax:  [phone]  Name: TIMOTHEY DAHLSTROM MRN: [id number] Date of Birth: [birth year]

## 2021-07-02 ENCOUNTER — Encounter: Payer: Self-pay | Admitting: Physical Therapy

## 2021-07-02 ENCOUNTER — Other Ambulatory Visit: Payer: Self-pay

## 2021-07-02 ENCOUNTER — Ambulatory Visit (INDEPENDENT_AMBULATORY_CARE_PROVIDER_SITE_OTHER): Payer: Self-pay | Admitting: Physical Therapy

## 2021-07-02 DIAGNOSIS — M6281 Muscle weakness (generalized): Secondary | ICD-10-CM

## 2021-07-02 DIAGNOSIS — M25652 Stiffness of left hip, not elsewhere classified: Secondary | ICD-10-CM

## 2021-07-02 DIAGNOSIS — R2689 Other abnormalities of gait and mobility: Secondary | ICD-10-CM

## 2021-07-02 DIAGNOSIS — R2681 Unsteadiness on feet: Secondary | ICD-10-CM

## 2021-07-02 DIAGNOSIS — R293 Abnormal posture: Secondary | ICD-10-CM

## 2021-07-02 NOTE — Therapy (Signed)
[facility] [address] [place], [place], [address] Phone: [phone]   Fax:  [phone]  Physical Therapy Treatment  Patient Details  Name: Mike [name] MRN: [id number] Date of Birth: 1961/02/27 Referring Provider (PT): Meridee Score, MD   Encounter Date: 07/02/2021   PT End of Session - 07/02/21 1344     Visit Number 9    Number of Visits 21    Date for PT Re-Evaluation 08/09/21    Authorization Type BCBS    Authorization Time Period OOP & Deductible MET    PT Start Time 1345    PT Stop Time 1430    PT Time Calculation (min) 45 min    Equipment Utilized During Treatment Gait belt    Activity Tolerance Patient tolerated treatment well    Behavior During Therapy [facility] for tasks assessed/performed             Past Medical History:  Diagnosis Date   Anxiety    CAD (coronary artery disease)    Cryptogenic stroke ([place]) 05/10/2021   Heart attack ([place]) 08/01/2020   Heart failure with reduced ejection fraction ([place])    Hyperlipidemia    Hypertension    [employer] [id number] 05/10/2021 05/10/2021   PAD (peripheral artery disease) ([place])    right CIA & right distal iliac artery stents 10/16/20; left AKA 11/06/20   PFO (patent foramen ovale) 12/14/2020   Pneumonia    Pulmonary AV (arteriovenous) fistula ([place]) 12/14/2020   Stroke ([place]) 12/11/2020   no residuial effects    Past Surgical History:  Procedure Laterality Date   ABDOMINAL AORTOGRAM W/LOWER EXTREMITY N/A 10/16/2020   Procedure: ABDOMINAL AORTOGRAM W/LOWER EXTREMITY;  Surgeon: Nigel Mormon, MD;  Location: [place] CV [facility];  Service: Cardiovascular;  Laterality: N/A;   AMPUTATION Left 11/06/2020   Procedure: AMPUTATION ABOVE KNEE;  Surgeon: Rosetta Posner, MD;  Location: [place];  Service: Vascular;  Laterality: Left;   BUBBLE STUDY  12/14/2020   Procedure: BUBBLE STUDY;  Surgeon: Nigel Mormon, MD;  Location: [place];  Service: Cardiovascular;;   CARDIAC  CATHETERIZATION     CORONARY STENT INTERVENTION N/A 08/03/2020   Procedure: CORONARY STENT INTERVENTION;  Surgeon: Nigel Mormon, MD;  Location: [place] CV [facility];  Service: Cardiovascular;  Laterality: N/A;   CORONARY/GRAFT ACUTE MI REVASCULARIZATION N/A 08/01/2020   Procedure: Coronary/Graft Acute MI Revascularization;  Surgeon: Nigel Mormon, MD;  Location: [place] CV [facility];  Service: Cardiovascular;  Laterality: N/A;   INTRAVASCULAR LITHOTRIPSY Right 10/16/2020   Procedure: INTRAVASCULAR LITHOTRIPSY;  Surgeon: Nigel Mormon, MD;  Location: [place] [facility];  Service: Cardiovascular;  Laterality: Right;  Common and External Iliac   IR ANGIOGRAM PULMONARY LEFT SELECTIVE  03/20/2021   IR ANGIOGRAM SELECTIVE EACH ADDITIONAL VESSEL  03/20/2021   IR EMBO ARTERIAL NOT HEMORR HEMANG INC GUIDE ROADMAPPING  03/20/2021   IR RADIOLOGIST EVAL & MGMT  01/30/2021   IR RADIOLOGIST EVAL & MGMT  04/18/2021   IR US GUIDE VASC ACCESS RIGHT  03/20/2021   LEFT HEART CATH AND CORONARY ANGIOGRAPHY N/A 08/01/2020   Procedure: LEFT HEART CATH AND CORONARY ANGIOGRAPHY;  Surgeon: Nigel Mormon, MD;  Location: [place] CV [facility];  Service: Cardiovascular;  Laterality: N/A;   LEFT HEART CATH AND CORONARY ANGIOGRAPHY N/A 08/03/2020   Procedure: LEFT HEART CATH AND CORONARY ANGIOGRAPHY;  Surgeon: Nigel Mormon, MD;  Location: [place] [facility];  Service: Cardiovascular;  Laterality: N/A;   PERIPHERAL VASCULAR INTERVENTION  Right 10/16/2020   Procedure: PERIPHERAL VASCULAR INTERVENTION;  Surgeon: Nigel Mormon, MD;  Location: [place] [facility];  Service: Cardiovascular;  Laterality: Right;  Common and Iliac Stent   RADIOLOGY WITH ANESTHESIA N/A 03/20/2021   Procedure: PULMONARY EMBOLIZATION;  Surgeon: Aletta Edouard, MD;  Location: [place];  Service: Radiology;  Laterality: N/A;   TEE WITHOUT CARDIOVERSION N/A 12/14/2020   Procedure: TRANSESOPHAGEAL ECHOCARDIOGRAM (TEE);  Surgeon: Nigel Mormon, MD;  Location: [facility];  Service: Cardiovascular;  Laterality: N/A;    There were no vitals filed for this visit.   Subjective Assessment - 07/02/21 1345     Subjective He went to cook out over weekend but was on paved deck around pool. His cobra has kicked in.    Pertinent History PAD, gangrene s/p L AKA 11/06/20, HTN, CAD s/p STEMI with stenting 08/17/2020, and CHF. cryptogenic stroke Dec 2021    Patient Stated Goals to return to work, (he was released from current job over last weekend but wants to find job).  to walk in community,    Currently in Pain? No/denies    Pain Onset More than a month ago                               [facility] Adult PT Treatment/Exercise - 07/02/21 1300       Transfers   Transfers Sit to Stand;Stand to Sit    Sit to Stand 5: Supervision;With upper extremity assist;From chair/3-in-1   without armrests using UEs on chair not touching //bars to stabiize   Sit to Stand Details Verbal cues for technique;Verbal cues for safe use of DME/AE    Stand to Sit 5: Supervision;With upper extremity assist;To chair/3-in-1   to chairs without armrests using UEs on chair, not touching //bars for stability   Stand to Sit Details (indicate cue type and reason) Verbal cues for safe use of DME/AE;Verbal cues for technique      Ambulation/Gait   Ambulation/Gait Yes    Ambulation/Gait Assistance 5: Supervision;3: Mod assist;4: Min guard   supervision 2 crutches, min guard single rail in //bars, ModA single crutch + HHA   Ambulation Distance (Feet) 100 Feet   100' & 33' w/2 crutches,  total of 75' inside //bars with single right crutch & intermittent touch //bars, 40' with right crutch & LUE HHA.   Assistive device Lofstrands;Prosthesis;R Forearm Crutch;1 person hand held assist    Ambulation Surface Level;Indoor    Ramp 5: Supervision   forearm crutches & TFA prosthesis   Curb 5: Supervision   with forearm crutches & TFA prosthesis.   Curb Details  (indicate cue type and reason) cues on sequence.      High Level Balance   High Level Balance Activities Side stepping;Turns;Other (comment);Backward walking   90* turns from stationary stance   High Level Balance Comments in //bars with RUE crutch & LUE barsupport,  demo, verbal & tactile cues on technique including engaging prosthesis.      Therapeutic Activites    Therapeutic Activities --    Lifting --      Neuro Re-ed    Neuro Re-ed Details  --      Exercises   Exercises Knee/Hip      Knee/Hip Exercises: Aerobic   Tread Mill 1.0 mph BUE support for 2.5 min then 1.5 min working on fluency, step length & prosthetic control.  PT demo & verbal cues on technique  for safety including stepping on/off machine & belt.      Prosthetics   Prosthetic Care Comments  --    Current prosthetic wear tolerance (days/week)  daily    Current prosthetic wear tolerance (#hours/day)  9.5 hrs total / day between 2 wears.    Education Provided Proper wear schedule/adjustment;Correct ply sock adjustment;Other (comment)   see prosthetic care comments   Person(s) Educated Patient    Education Method Explanation;Verbal cues    Education Method Verbalized understanding;Verbal cues required;Needs further instruction                 Balance Exercises - 07/02/21 1345       Balance Exercises: Standing   Standing Eyes Opened Wide ([place]);Solid surface;5 reps;Head turns   head turns 4 directions   Standing Eyes Opened Limitations mirror, tactile & verbal cues for midline equal weight bearing at midpoint.    Standing Eyes Closed Wide ([place]);Solid surface;Head turns;5 reps   head turns 4 directions                PT Short Term Goals - 06/26/21 0926       PT SHORT TERM GOAL #1   Title Patient verbalizes proper adjustment of ply socks.    Time 1    Period Months    Status New    Target Date 06/28/21      PT SHORT TERM GOAL #2   Title Patient tolerates prosthesis >12 hrs total /day without  skin issues or limb pain    Time 1    Period Months    Status On-going    Target Date 06/28/21      PT SHORT TERM GOAL #3   Title Patient able to scan right/left & up/down maintaining equal weight bearing BLEs.    Time 1    Period Months    Status Revised    Target Date 06/28/21      PT SHORT TERM GOAL #4   Title Patient ambulates 200' with forearm crutches & prosthesis with supervision with proper prosthetic knee control.    Time 1    Period Months    Status Revised    Target Date 06/28/21      PT SHORT TERM GOAL #5   Title Patient negotiates ramps & curbs with forearm crutches & prosthesis with supervision.    Time 1    Period Months    Status Revised    Target Date 06/28/21               PT Long Term Goals - 05/22/21 1606       PT LONG TERM GOAL #1   Title Patient demonstrates & verbalized understanding of prosthetic care to enable safe utilization of prosthesis.    Time 10    Period Weeks    Status New    Target Date 08/09/21      PT LONG TERM GOAL #2   Title Patient tolerates prosthesis wear >90% of awake hours without skin or limb pain issues.    Time 10    Period Weeks    Status New    Target Date 08/09/21      PT LONG TERM GOAL #3   Title Berg Balance >/= 45/56 to indicate lower fall risk    Time 10    Period Weeks    Status New    Target Date 08/09/21      PT LONG TERM GOAL #4   Title Patient ambulates 500'  outdoors with LRAD & prosthesis modified independent    Time 10    Period Weeks    Status New    Target Date 08/09/21      PT LONG TERM GOAL #5   Title Patient negotiates ramps, curbs & stairs with LRAD & prosthesis modified independent.    Time 10    Period Weeks    Status New    Target Date 08/09/21      Additional Long Term Goals   Additional Long Term Goals Yes      PT LONG TERM GOAL #6   Title Patient demo proper lifting, carrying up to 30#, push/pull task and climbing ladder for return to work skills.    Time 10    Period  Weeks    Status New    Target Date 08/09/21                   Plan - 07/02/21 1344     Clinical Impression Statement PT added treadmill to PT program today to work on fluency of gait. He did well for first time.  His standing balance without UE support continues to improve.    Personal Factors and Comorbidities Comorbidity 3+;Fitness;Time since onset of injury/illness/exacerbation    Comorbidities PAD, gangrene s/p L AKA 11/06/20, HTN, CAD s/p STEMI with stenting 08/17/2020, and CHF. cryptogenic stroke Dec 2021    Examination-Activity Limitations Lift;Locomotion Level;Squat;Stairs;Stand;Transfers    Examination-Participation Restrictions Community Activity;Occupation    Stability/Clinical Decision Making Evolving/Moderate complexity    Rehab Potential Good    PT Frequency 2x / week    PT Duration Other (comment)   10 weeks   PT Treatment/Interventions ADLs/Self Care Home Management;DME Instruction;Gait training;Stair training;Functional mobility training;Therapeutic activities;Therapeutic exercise;Balance training;Neuromuscular re-education;Patient/family education;Prosthetic Training;Manual techniques;Passive range of motion    PT Next Visit Plan continue treadmil work,  review prosthetic care,  prosthetic gait 4-point with axillary crutches including ramps & curbs,  work on using hydraulics to descend stairs. standing balance. single crutch gait.    Consulted and Agree with Plan of Care Patient             Patient will benefit from skilled therapeutic intervention in order to improve the following deficits and impairments:  Abnormal gait, Decreased activity tolerance, Decreased balance, Decreased endurance, Decreased knowledge of use of DME, Decreased mobility, Decreased range of motion, Decreased strength, Increased edema, Postural dysfunction, Prosthetic Dependency, Pain  Visit Diagnosis: Unsteadiness on feet  Other abnormalities of gait and mobility  Muscle weakness  (generalized)  Stiffness of left hip, not elsewhere classified  Abnormal posture     Problem List Patient Active Problem List   Diagnosis Date Noted   Cryptogenic stroke ([place]) 05/10/2021   [employer] [id number] 05/10/2021 05/10/2021   AVM (arteriovenous malformation) 03/20/2021   Pulmonary AV (arteriovenous) fistula ([place]) 02/20/2021   Pre-ulcerative calluses 12/20/2020   Cerebral thrombosis with cerebral infarction 12/11/2020   Cerebrovascular accident (CVA) due to embolism of cerebral artery ([place]) 12/11/2020   Pressure injury of skin 11/12/2020   Acute urinary retention 11/09/2020   Normal anion gap metabolic acidosis 38/18/2993   Hypotension 11/07/2020   Chest pain 10/31/2020   Acute on chronic HFrEF (heart failure with reduced ejection fraction) ([place])    Goals of care, counseling/discussion    Palliative care by specialist    DNR (do not resuscitate)    Critical limb ischemia with history of revascularization of same extremity ([place]) 10/30/2020   Hyperglycemia 10/30/2020   Tobacco abuse 10/30/2020  Leukocytosis 10/30/2020   Chronic combined systolic and diastolic CHF (congestive heart failure) ([place]) 10/30/2020   Ischemic cardiomyopathy 10/24/2020   AKI (acute kidney injury) ([place]) 10/17/2020   PAD (peripheral artery disease) ([place]) 10/16/2020   Critical lower limb ischemia ([place]) 10/05/2020   Rest pain of left upper extremity due to atherosclerosis ([place]) 10/05/2020   Severe claudication ([place]) 09/20/2020   Paresthesia of both lower extremities 09/20/2020   H/O cardiac arrest 09/20/2020   H/O acute myocardial infarction 08/14/2020   Snoring 08/14/2020   Sinus pause 08/14/2020   Mixed hyperlipidemia 08/14/2020   Tobacco dependence 08/14/2020   Coronary artery disease involving native coronary artery of native heart without angina pectoris 08/10/2020   HFrEF (heart failure with reduced ejection fraction) ([place]) 08/10/2020   Nonrheumatic mitral valve regurgitation  08/10/2020   STEMI (ST elevation myocardial infarction) ([place]) 08/02/2020   Accelerated hypertension 08/01/2020    Jamey Reas, PT, DPT 07/02/2021, 3:10 PM  [facility] Physical Therapy [address] [place], [place], [address] Phone: [phone]   Fax:  [phone]  Name: Mike [name] MRN: [id number] Date of Birth: 1961-05-10

## 2021-07-03 ENCOUNTER — Encounter: Payer: Self-pay | Admitting: Physical Therapy

## 2021-07-03 ENCOUNTER — Ambulatory Visit (INDEPENDENT_AMBULATORY_CARE_PROVIDER_SITE_OTHER): Payer: Self-pay | Admitting: Physical Therapy

## 2021-07-03 DIAGNOSIS — R293 Abnormal posture: Secondary | ICD-10-CM

## 2021-07-03 DIAGNOSIS — R2689 Other abnormalities of gait and mobility: Secondary | ICD-10-CM

## 2021-07-03 DIAGNOSIS — M6281 Muscle weakness (generalized): Secondary | ICD-10-CM

## 2021-07-03 DIAGNOSIS — M25652 Stiffness of left hip, not elsewhere classified: Secondary | ICD-10-CM

## 2021-07-03 DIAGNOSIS — R2681 Unsteadiness on feet: Secondary | ICD-10-CM

## 2021-07-03 NOTE — Therapy (Signed)
[facility] [address] [place], [place], [address] Phone: [phone]   Fax:  [phone]  Physical Therapy Treatment  Patient Details  Name: Mike [name] MRN: [id number] Date of Birth: [birth year] Referring Provider (PT): Meridee Score, MD   Encounter Date: 07/03/2021   PT End of Session - 07/03/21 1146     Visit Number 10    Number of Visits 21    Date for PT Re-Evaluation 08/09/21    Authorization Type BCBS    Authorization Time Period OOP & Deductible MET    PT Start Time 2595    PT Stop Time 1226    PT Time Calculation (min) 41 min    Equipment Utilized During Treatment Gait belt    Activity Tolerance Patient tolerated treatment well    Behavior During Therapy [facility] for tasks assessed/performed             Past Medical History:  Diagnosis Date   Anxiety    CAD (coronary artery disease)    Cryptogenic stroke ([place]) 05/10/2021   Heart attack ([place]) 08/01/2020   Heart failure with reduced ejection fraction ([place])    Hyperlipidemia    Hypertension    [employer] [id number] 05/10/2021 05/10/2021   PAD (peripheral artery disease) ([place])    right CIA & right distal iliac artery stents 10/16/20; left AKA 11/06/20   PFO (patent foramen ovale) 12/14/2020   Pneumonia    Pulmonary AV (arteriovenous) fistula ([place]) 12/14/2020   Stroke ([place]) 12/11/2020   no residuial effects    Past Surgical History:  Procedure Laterality Date   ABDOMINAL AORTOGRAM W/LOWER EXTREMITY N/A 10/16/2020   Procedure: ABDOMINAL AORTOGRAM W/LOWER EXTREMITY;  Surgeon: Nigel Mormon, MD;  Location: [place] [facility];  Service: Cardiovascular;  Laterality: N/A;   AMPUTATION Left 11/06/2020   Procedure: AMPUTATION ABOVE KNEE;  Surgeon: Rosetta Posner, MD;  Location: [place];  Service: Vascular;  Laterality: Left;   BUBBLE STUDY  12/14/2020   Procedure: BUBBLE STUDY;  Surgeon: Nigel Mormon, MD;  Location: [place];  Service: Cardiovascular;;   CARDIAC  CATHETERIZATION     CORONARY STENT INTERVENTION N/A 08/03/2020   Procedure: CORONARY STENT INTERVENTION;  Surgeon: Nigel Mormon, MD;  Location: [place] [facility];  Service: Cardiovascular;  Laterality: N/A;   CORONARY/GRAFT ACUTE MI REVASCULARIZATION N/A 08/01/2020   Procedure: Coronary/Graft Acute MI Revascularization;  Surgeon: Nigel Mormon, MD;  Location: [place] [facility];  Service: Cardiovascular;  Laterality: N/A;   INTRAVASCULAR LITHOTRIPSY Right 10/16/2020   Procedure: INTRAVASCULAR LITHOTRIPSY;  Surgeon: Nigel Mormon, MD;  Location: [place] [facility];  Service: Cardiovascular;  Laterality: Right;  Common and External Iliac   IR ANGIOGRAM PULMONARY LEFT SELECTIVE  03/20/2021   IR ANGIOGRAM SELECTIVE EACH ADDITIONAL VESSEL  03/20/2021   IR EMBO ARTERIAL NOT HEMORR HEMANG INC GUIDE ROADMAPPING  03/20/2021   IR RADIOLOGIST EVAL & MGMT  01/30/2021   IR RADIOLOGIST EVAL & MGMT  04/18/2021   IR US GUIDE VASC ACCESS RIGHT  03/20/2021   LEFT HEART CATH AND CORONARY ANGIOGRAPHY N/A 08/01/2020   Procedure: LEFT HEART CATH AND CORONARY ANGIOGRAPHY;  Surgeon: Nigel Mormon, MD;  Location: [place] [facility];  Service: Cardiovascular;  Laterality: N/A;   LEFT HEART CATH AND CORONARY ANGIOGRAPHY N/A 08/03/2020   Procedure: LEFT HEART CATH AND CORONARY ANGIOGRAPHY;  Surgeon: Nigel Mormon, MD;  Location: [place] [facility];  Service: Cardiovascular;  Laterality: N/A;   PERIPHERAL VASCULAR INTERVENTION  Right 10/16/2020   Procedure: PERIPHERAL VASCULAR INTERVENTION;  Surgeon: Nigel Mormon, MD;  Location: [place] [facility];  Service: Cardiovascular;  Laterality: Right;  Common and Iliac Stent   RADIOLOGY WITH ANESTHESIA N/A 03/20/2021   Procedure: PULMONARY EMBOLIZATION;  Surgeon: Aletta Edouard, MD;  Location: [place];  Service: Radiology;  Laterality: N/A;   TEE WITHOUT CARDIOVERSION N/A 12/14/2020   Procedure: TRANSESOPHAGEAL ECHOCARDIOGRAM (TEE);  Surgeon: Nigel Mormon, MD;  Location: [facility];  Service: Cardiovascular;  Laterality: N/A;    There were no vitals filed for this visit.   Subjective Assessment - 07/03/21 1145     Subjective He wore prosthesis until 9:30 so his limb was sore.    Pertinent History PAD, gangrene s/p L AKA 11/06/20, HTN, CAD s/p STEMI with stenting 08/17/2020, and CHF. cryptogenic stroke Dec 2021    Patient Stated Goals to return to work, (he was released from current job over last weekend but wants to find job).  to walk in community,    Currently in Pain? No/denies    Pain Onset More than a month ago                               [facility] Adult PT Treatment/Exercise - 07/03/21 1145       Transfers   Transfers Sit to Stand;Stand to Sit    Sit to Stand 5: Supervision;With upper extremity assist;From chair/3-in-1   without armrests using UEs on chair not touching //bars to stabiize   Sit to Stand Details Verbal cues for technique;Verbal cues for safe use of DME/AE    Stand to Sit 5: Supervision;With upper extremity assist;To chair/3-in-1   to chairs without armrests using UEs on chair, not touching //bars for stability   Stand to Sit Details (indicate cue type and reason) Verbal cues for safe use of DME/AE;Verbal cues for technique      Ambulation/Gait   Ambulation/Gait Yes    Ambulation/Gait Assistance 5: Supervision;3: Mod assist;4: Min guard   supervision 2 crutches, min guard single rail in //bars, ModA single crutch + HHA   Ambulation Distance (Feet) 100 Feet   100' & 99' w/2 crutches,  total of 75' inside //bars with single right crutch & intermittent touch //bars, 28' with right crutch & LUE HHA.   Assistive device Lofstrands;Prosthesis;R Forearm Crutch;1 person hand held assist    Ramp 5: Supervision   forearm crutches & TFA prosthesis   Curb 5: Supervision   with forearm crutches & TFA prosthesis.     High Level Balance   High Level Balance Activities Side stepping;Turns;Other  (comment);Backward walking   90* turns from stationary stance   High Level Balance Comments in //bars with RUE crutch & LUE bar support with cues to minimize weight bearing,  demo, verbal & tactile cues on technique including engaging prosthesis.      Neuro Re-ed    Neuro Re-ed Details  standing in //bars without UE support - blue theraband 10 reps ea - alternating & BUEs forward reach, rows, & overhead reach.      Exercises   Exercises Knee/Hip      Knee/Hip Exercises: Aerobic   Tread Mill 1.0 mph BUE support for 2.5 min then 1.75 min working on fluency, step length & prosthetic control.  PT demo & verbal cues on technique for safety including stepping on/off machine & belt.      Prosthetics   Prosthetic Care  Comments  increase wear to most of awake hours    Current prosthetic wear tolerance (days/week)  daily    Current prosthetic wear tolerance (#hours/day)  9.5 hrs total / day between 2 wears.    Education Provided Proper wear schedule/adjustment;Correct ply sock adjustment;Other (comment)   see prosthetic care comments   Person(s) Educated Patient    Education Method Explanation;Verbal cues    Education Method Verbalized understanding    Donning Prosthesis Modified independent (device/increased time)                      PT Short Term Goals - 06/26/21 0926       PT SHORT TERM GOAL #1   Title Patient verbalizes proper adjustment of ply socks.    Time 1    Period Months    Status New    Target Date 06/28/21      PT SHORT TERM GOAL #2   Title Patient tolerates prosthesis >12 hrs total /day without skin issues or limb pain    Time 1    Period Months    Status On-going    Target Date 06/28/21      PT SHORT TERM GOAL #3   Title Patient able to scan right/left & up/down maintaining equal weight bearing BLEs.    Time 1    Period Months    Status Revised    Target Date 06/28/21      PT SHORT TERM GOAL #4   Title Patient ambulates 200' with forearm crutches &  prosthesis with supervision with proper prosthetic knee control.    Time 1    Period Months    Status Revised    Target Date 06/28/21      PT SHORT TERM GOAL #5   Title Patient negotiates ramps & curbs with forearm crutches & prosthesis with supervision.    Time 1    Period Months    Status Revised    Target Date 06/28/21               PT Long Term Goals - 05/22/21 1606       PT LONG TERM GOAL #1   Title Patient demonstrates & verbalized understanding of prosthetic care to enable safe utilization of prosthesis.    Time 10    Period Weeks    Status New    Target Date 08/09/21      PT LONG TERM GOAL #2   Title Patient tolerates prosthesis wear >90% of awake hours without skin or limb pain issues.    Time 10    Period Weeks    Status New    Target Date 08/09/21      PT LONG TERM GOAL #3   Title [place] Balance >/= 45/56 to indicate lower fall risk    Time 10    Period Weeks    Status New    Target Date 08/09/21      PT LONG TERM GOAL #4   Title Patient ambulates 500' outdoors with LRAD & prosthesis modified independent    Time 10    Period Weeks    Status New    Target Date 08/09/21      PT LONG TERM GOAL #5   Title Patient negotiates ramps, curbs & stairs with LRAD & prosthesis modified independent.    Time 10    Period Weeks    Status New    Target Date 08/09/21      Additional Long Term  Goals   Additional Long Term Goals Yes      PT LONG TERM GOAL #6   Title Patient demo proper lifting, carrying up to 30#, push/pull task and climbing ladder for return to work skills.    Time 10    Period Weeks    Status New    Target Date 08/09/21                   Plan - 07/03/21 1146     Clinical Impression Statement Patient improved his ability to move backwards, sideways & turn 90* using RUE single crutch & light LUE support. Patient needs additional training with treadmill prior to using outside of PT clinic.    Personal Factors and Comorbidities  Comorbidity 3+;Fitness;Time since onset of injury/illness/exacerbation    Comorbidities PAD, gangrene s/p L AKA 11/06/20, HTN, CAD s/p STEMI with stenting 08/17/2020, and CHF. cryptogenic stroke Dec 2021    Examination-Activity Limitations Lift;Locomotion Level;Squat;Stairs;Stand;Transfers    Examination-Participation Restrictions Community Activity;Occupation    Stability/Clinical Decision Making Evolving/Moderate complexity    Rehab Potential Good    PT Frequency 2x / week    PT Duration Other (comment)   10 weeks   PT Treatment/Interventions ADLs/Self Care Home Management;DME Instruction;Gait training;Stair training;Functional mobility training;Therapeutic activities;Therapeutic exercise;Balance training;Neuromuscular re-education;Patient/family education;Prosthetic Training;Manual techniques;Passive range of motion    PT Next Visit Plan continue treadmil work,  review prosthetic care,  prosthetic gait 4-point with axillary crutches including ramps & curbs,  work on using hydraulics to descend stairs. standing balance. single crutch gait.    Consulted and Agree with Plan of Care Patient             Patient will benefit from skilled therapeutic intervention in order to improve the following deficits and impairments:  Abnormal gait, Decreased activity tolerance, Decreased balance, Decreased endurance, Decreased knowledge of use of DME, Decreased mobility, Decreased range of motion, Decreased strength, Increased edema, Postural dysfunction, Prosthetic Dependency, Pain  Visit Diagnosis: Other abnormalities of gait and mobility  Unsteadiness on feet  Muscle weakness (generalized)  Stiffness of left hip, not elsewhere classified  Abnormal posture     Problem List Patient Active Problem List   Diagnosis Date Noted   Cryptogenic stroke ([place]) 05/10/2021   [employer] [id number] 05/10/2021 05/10/2021   AVM (arteriovenous malformation) 03/20/2021   Pulmonary AV (arteriovenous) fistula  ([place]) 02/20/2021   Pre-ulcerative calluses 12/20/2020   Cerebral thrombosis with cerebral infarction 12/11/2020   Cerebrovascular accident (CVA) due to embolism of cerebral artery ([place]) 12/11/2020   Pressure injury of skin 11/12/2020   Acute urinary retention 11/09/2020   Normal anion gap metabolic acidosis 54/08/8118   Hypotension 11/07/2020   Chest pain 10/31/2020   Acute on chronic HFrEF (heart failure with reduced ejection fraction) ([place])    Goals of care, counseling/discussion    Palliative care by specialist    DNR (do not resuscitate)    Critical limb ischemia with history of revascularization of same extremity ([place]) 10/30/2020   Hyperglycemia 10/30/2020   Tobacco abuse 10/30/2020   Leukocytosis 10/30/2020   Chronic combined systolic and diastolic CHF (congestive heart failure) ([place]) 10/30/2020   Ischemic cardiomyopathy 10/24/2020   AKI (acute kidney injury) ([place]) 10/17/2020   PAD (peripheral artery disease) ([place]) 10/16/2020   Critical lower limb ischemia ([place]) 10/05/2020   Rest pain of left upper extremity due to atherosclerosis ([place]) 10/05/2020   Severe claudication ([place]) 09/20/2020   Paresthesia of both lower extremities 09/20/2020   H/O cardiac arrest 09/20/2020  H/O acute myocardial infarction 08/14/2020   Snoring 08/14/2020   Sinus pause 08/14/2020   Mixed hyperlipidemia 08/14/2020   Tobacco dependence 08/14/2020   Coronary artery disease involving native coronary artery of native heart without angina pectoris 08/10/2020   HFrEF (heart failure with reduced ejection fraction) ([place]) 08/10/2020   Nonrheumatic mitral valve regurgitation 08/10/2020   STEMI (ST elevation myocardial infarction) ([place]) 08/02/2020   Accelerated hypertension 08/01/2020    Jamey Reas, PT, DPT 07/03/2021, 12:49 PM  [facility] [address] [place], [place], [address] Phone: [phone]   Fax:  [phone]  Name: KIANTE CIAVARELLA MRN:  [id number] Date of Birth: 1961/09/19

## 2021-07-08 ENCOUNTER — Ambulatory Visit (INDEPENDENT_AMBULATORY_CARE_PROVIDER_SITE_OTHER): Payer: Self-pay | Admitting: Physical Therapy

## 2021-07-08 ENCOUNTER — Other Ambulatory Visit: Payer: Self-pay

## 2021-07-08 ENCOUNTER — Encounter: Payer: Self-pay | Admitting: Physical Therapy

## 2021-07-08 DIAGNOSIS — R2689 Other abnormalities of gait and mobility: Secondary | ICD-10-CM

## 2021-07-08 DIAGNOSIS — Z9181 History of falling: Secondary | ICD-10-CM

## 2021-07-08 DIAGNOSIS — R2681 Unsteadiness on feet: Secondary | ICD-10-CM

## 2021-07-08 DIAGNOSIS — R293 Abnormal posture: Secondary | ICD-10-CM

## 2021-07-08 DIAGNOSIS — M6281 Muscle weakness (generalized): Secondary | ICD-10-CM

## 2021-07-08 DIAGNOSIS — M25652 Stiffness of left hip, not elsewhere classified: Secondary | ICD-10-CM

## 2021-07-08 NOTE — Therapy (Signed)
[facility] [address] [place], [place], [address] Phone: [phone]   Fax:  [phone]  Physical Therapy Treatment  Patient Details  Name: Mike [name] MRN: [id number] Date of Birth: 1961/12/01 Referring Provider (PT): Meridee Score, MD   Encounter Date: 07/08/2021   PT End of Session - 07/08/21 0934     Visit Number 11    Number of Visits 21    Date for PT Re-Evaluation 08/09/21    Authorization Type BCBS    Authorization Time Period OOP & Deductible MET    PT Start Time 0930    PT Stop Time [id number]    PT Time Calculation (min) 45 min    Equipment Utilized During Treatment Gait belt    Activity Tolerance Patient tolerated treatment well    Behavior During Therapy [facility] for tasks assessed/performed             Past Medical History:  Diagnosis Date   Anxiety    CAD (coronary artery disease)    Cryptogenic stroke ([place]) 05/10/2021   Heart attack ([place]) 08/01/2020   Heart failure with reduced ejection fraction ([place])    Hyperlipidemia    Hypertension    [employer] [id number] 05/10/2021 05/10/2021   PAD (peripheral artery disease) ([place])    right CIA & right distal iliac artery stents 10/16/20; left AKA 11/06/20   PFO (patent foramen ovale) 12/14/2020   Pneumonia    Pulmonary AV (arteriovenous) fistula ([place]) 12/14/2020   Stroke ([place]) 12/11/2020   no residuial effects    Past Surgical History:  Procedure Laterality Date   ABDOMINAL AORTOGRAM W/LOWER EXTREMITY N/A 10/16/2020   Procedure: ABDOMINAL AORTOGRAM W/LOWER EXTREMITY;  Surgeon: Nigel Mormon, MD;  Location: [place] [facility];  Service: Cardiovascular;  Laterality: N/A;   AMPUTATION Left 11/06/2020   Procedure: AMPUTATION ABOVE KNEE;  Surgeon: Rosetta Posner, MD;  Location: [place];  Service: Vascular;  Laterality: Left;   BUBBLE STUDY  12/14/2020   Procedure: BUBBLE STUDY;  Surgeon: Nigel Mormon, MD;  Location: [place];  Service: Cardiovascular;;    CARDIAC CATHETERIZATION     CORONARY STENT INTERVENTION N/A 08/03/2020   Procedure: CORONARY STENT INTERVENTION;  Surgeon: Nigel Mormon, MD;  Location: [place] [facility];  Service: Cardiovascular;  Laterality: N/A;   CORONARY/GRAFT ACUTE MI REVASCULARIZATION N/A 08/01/2020   Procedure: Coronary/Graft Acute MI Revascularization;  Surgeon: Nigel Mormon, MD;  Location: [place] [facility];  Service: Cardiovascular;  Laterality: N/A;   INTRAVASCULAR LITHOTRIPSY Right 10/16/2020   Procedure: INTRAVASCULAR LITHOTRIPSY;  Surgeon: Nigel Mormon, MD;  Location: [place] [facility];  Service: Cardiovascular;  Laterality: Right;  Common and External Iliac   IR ANGIOGRAM PULMONARY LEFT SELECTIVE  03/20/2021   IR ANGIOGRAM SELECTIVE EACH ADDITIONAL VESSEL  03/20/2021   IR EMBO ARTERIAL NOT HEMORR HEMANG INC GUIDE ROADMAPPING  03/20/2021   IR RADIOLOGIST EVAL & MGMT  01/30/2021   IR RADIOLOGIST EVAL & MGMT  04/18/2021   IR US GUIDE VASC ACCESS RIGHT  03/20/2021   LEFT HEART CATH AND CORONARY ANGIOGRAPHY N/A 08/01/2020   Procedure: LEFT HEART CATH AND CORONARY ANGIOGRAPHY;  Surgeon: Nigel Mormon, MD;  Location: [place] [facility];  Service: Cardiovascular;  Laterality: N/A;   LEFT HEART CATH AND CORONARY ANGIOGRAPHY N/A 08/03/2020   Procedure: LEFT HEART CATH AND CORONARY ANGIOGRAPHY;  Surgeon: Nigel Mormon, MD;  Location: [place] [facility];  Service: Cardiovascular;  Laterality: N/A;   PERIPHERAL VASCULAR INTERVENTION  Right 10/16/2020   Procedure: PERIPHERAL VASCULAR INTERVENTION;  Surgeon: Nigel Mormon, MD;  Location: [place] [facility];  Service: Cardiovascular;  Laterality: Right;  Common and Iliac Stent   RADIOLOGY WITH ANESTHESIA N/A 03/20/2021   Procedure: PULMONARY EMBOLIZATION;  Surgeon: Aletta Edouard, MD;  Location: [place];  Service: Radiology;  Laterality: N/A;   TEE WITHOUT CARDIOVERSION N/A 12/14/2020   Procedure: TRANSESOPHAGEAL ECHOCARDIOGRAM (TEE);  Surgeon:  Nigel Mormon, MD;  Location: [facility];  Service: Cardiovascular;  Laterality: N/A;    There were no vitals filed for this visit.   Subjective Assessment - 07/08/21 0930     Subjective His [employer] insurance kicks in on Friday and will retro.  He wore prosthesis Saturday 9.5 hrs & Sunday 10 hrs.    Pertinent History PAD, gangrene s/p L AKA 11/06/20, HTN, CAD s/p STEMI with stenting 08/17/2020, and CHF. cryptogenic stroke Dec 2021    Patient Stated Goals to return to work, (he was released from current job over last weekend but wants to find job).  to walk in community,    Currently in Pain? No/denies    Pain Onset More than a month ago                               [facility] Adult PT Treatment/Exercise - 07/08/21 0930       Transfers   Transfers Sit to Stand;Stand to Sit    Sit to Stand 5: Supervision;With upper extremity assist;From chair/3-in-1   without armrests using UEs on chair not touching //bars to stabiize   Sit to Stand Details Verbal cues for technique;Verbal cues for safe use of DME/AE    Sit to Stand Details (indicate cue type and reason) worked on not touching external support    Stand to Sit 5: Supervision;With upper extremity assist;To chair/3-in-1   to chairs without armrests using UEs on chair, not touching //bars for stability   Stand to Sit Details (indicate cue type and reason) Verbal cues for safe use of DME/AE;Verbal cues for technique    Stand to Sit Details worked on not touching external support      Ambulation/Gait   Ambulation/Gait Yes    Ambulation/Gait Assistance 5: Supervision;3: Mod assist;4: Min guard   supervision 2 crutches, min guard single rail in //bars, ModA single crutch + HHA   Ambulation Distance (Feet) 100 Feet   100' & 53' w/2 crutches,  total of 75' inside //bars with single right crutch & intermittent touch //bars, 69' with right crutch & light LUE HHA.   Assistive device Lofstrands;Prosthesis;R Forearm Crutch;1 person  hand held assist    Ramp 5: Supervision   forearm crutches & TFA prosthesis   Ramp Details (indicate cue type and reason) tactile & verbal cues on technique.    Curb 5: Supervision   with forearm crutches & TFA prosthesis.   Curb Details (indicate cue type and reason) verbal cues on technique.      High Level Balance   High Level Balance Activities Side stepping;Turns;Other (comment);Backward walking   90* turns from stationary stance   High Level Balance Comments in //bars with RUE crutch & LUE bar support with cues to minimize weight bearing,  demo, verbal & tactile cues on technique including engaging prosthesis.      Neuro Re-ed    Neuro Re-ed Details  standing in //bars without UE support - blue theraband 10 reps ea - alternating &  BUEs forward reach, rows, & overhead reach.      Exercises   Exercises Knee/Hip      Knee/Hip Exercises: Aerobic   Tread Mill 1.0 mph BUE support for 2.75 min then 1.5 min working on fluency, step length & prosthetic control.  PT demo & verbal cues on technique for safety including stepping on/off machine & belt.      Knee/Hip Exercises: Machines for Strengthening   Cybex Leg Press BLEs 68# 10 reps      Prosthetics   Prosthetic Care Comments  increase wear to most of awake hours    Current prosthetic wear tolerance (days/week)  daily    Current prosthetic wear tolerance (#hours/day)  9.5-10 hrs total / day between 2 wears.    Education Provided Proper wear schedule/adjustment;Correct ply sock adjustment;Other (comment)   see prosthetic care comments   Person(s) Educated Patient    Education Method Explanation;Verbal cues    Education Method Verbalized understanding;Verbal cues required;Needs further instruction    Donning Prosthesis Modified independent (device/increased time)                 Balance Exercises - 07/08/21 0930       Balance Exercises: Standing   Standing Eyes Opened Wide ([place]);Solid surface;5 reps;Head turns;Foam/compliant  surface   head turns 4 directions   Standing Eyes Opened Limitations mirror, tactile & verbal cues for midline equal weight bearing at midpoint.    Standing Eyes Closed Wide ([place]);Solid surface;Head turns;5 reps   head turns 4 directions   Standing Eyes Closed Limitations visual for midline equal weight prior to & after closing eyes.                 PT Short Term Goals - 06/26/21 0926       PT SHORT TERM GOAL #1   Title Patient verbalizes proper adjustment of ply socks.    Time 1    Period Months    Status New    Target Date 06/28/21      PT SHORT TERM GOAL #2   Title Patient tolerates prosthesis >12 hrs total /day without skin issues or limb pain    Time 1    Period Months    Status On-going    Target Date 06/28/21      PT SHORT TERM GOAL #3   Title Patient able to scan right/left & up/down maintaining equal weight bearing BLEs.    Time 1    Period Months    Status Revised    Target Date 06/28/21      PT SHORT TERM GOAL #4   Title Patient ambulates 200' with forearm crutches & prosthesis with supervision with proper prosthetic knee control.    Time 1    Period Months    Status Revised    Target Date 06/28/21      PT SHORT TERM GOAL #5   Title Patient negotiates ramps & curbs with forearm crutches & prosthesis with supervision.    Time 1    Period Months    Status Revised    Target Date 06/28/21               PT Long Term Goals - 05/22/21 1606       PT LONG TERM GOAL #1   Title Patient demonstrates & verbalized understanding of prosthetic care to enable safe utilization of prosthesis.    Time 10    Period Weeks    Status New    Target Date  08/09/21      PT LONG TERM GOAL #2   Title Patient tolerates prosthesis wear >90% of awake hours without skin or limb pain issues.    Time 10    Period Weeks    Status New    Target Date 08/09/21      PT LONG TERM GOAL #3   Title Berg Balance >/= 45/56 to indicate lower fall risk    Time 10    Period  Weeks    Status New    Target Date 08/09/21      PT LONG TERM GOAL #4   Title Patient ambulates 500' outdoors with LRAD & prosthesis modified independent    Time 10    Period Weeks    Status New    Target Date 08/09/21      PT LONG TERM GOAL #5   Title Patient negotiates ramps, curbs & stairs with LRAD & prosthesis modified independent.    Time 10    Period Weeks    Status New    Target Date 08/09/21      Additional Long Term Goals   Additional Long Term Goals Yes      PT LONG TERM GOAL #6   Title Patient demo proper lifting, carrying up to 30#, push/pull task and climbing ladder for return to work skills.    Time 10    Period Weeks    Status New    Target Date 08/09/21                   Plan - 07/08/21 0934     Clinical Impression Statement Patient's distal limb gets sore with weight bearing activiies which limits standing & gait.  He is improving but slowy.    Personal Factors and Comorbidities Comorbidity 3+;Fitness;Time since onset of injury/illness/exacerbation    Comorbidities PAD, gangrene s/p L AKA 11/06/20, HTN, CAD s/p STEMI with stenting 08/17/2020, and CHF. cryptogenic stroke Dec 2021    Examination-Activity Limitations Lift;Locomotion Level;Squat;Stairs;Stand;Transfers    Examination-Participation Restrictions Community Activity;Occupation    Stability/Clinical Decision Making Evolving/Moderate complexity    Rehab Potential Good    PT Frequency 2x / week    PT Duration Other (comment)   10 weeks   PT Treatment/Interventions ADLs/Self Care Home Management;DME Instruction;Gait training;Stair training;Functional mobility training;Therapeutic activities;Therapeutic exercise;Balance training;Neuromuscular re-education;Patient/family education;Prosthetic Training;Manual techniques;Passive range of motion    PT Next Visit Plan continue treadmil work,  review prosthetic care,  prosthetic gait 4-point with axillary crutches including ramps & curbs,  work on using  hydraulics to descend stairs. standing balance. single crutch gait.    Consulted and Agree with Plan of Care Patient             Patient will benefit from skilled therapeutic intervention in order to improve the following deficits and impairments:  Abnormal gait, Decreased activity tolerance, Decreased balance, Decreased endurance, Decreased knowledge of use of DME, Decreased mobility, Decreased range of motion, Decreased strength, Increased edema, Postural dysfunction, Prosthetic Dependency, Pain  Visit Diagnosis: Other abnormalities of gait and mobility  Unsteadiness on feet  Muscle weakness (generalized)  Stiffness of left hip, not elsewhere classified  Abnormal posture  History of falling     Problem List Patient Active Problem List   Diagnosis Date Noted   Cryptogenic stroke ([place]) 05/10/2021   [employer] [id number] 05/10/2021 05/10/2021   AVM (arteriovenous malformation) 03/20/2021   Pulmonary AV (arteriovenous) fistula ([place]) 02/20/2021   Pre-ulcerative calluses 12/20/2020   Cerebral thrombosis with cerebral  infarction 12/11/2020   Cerebrovascular accident (CVA) due to embolism of cerebral artery ([place]) 12/11/2020   Pressure injury of skin 11/12/2020   Acute urinary retention 11/09/2020   Normal anion gap metabolic acidosis 16/94/5038   Hypotension 11/07/2020   Chest pain 10/31/2020   Acute on chronic HFrEF (heart failure with reduced ejection fraction) ([place])    Goals of care, counseling/discussion    Palliative care by specialist    DNR (do not resuscitate)    Critical limb ischemia with history of revascularization of same extremity ([place]) 10/30/2020   Hyperglycemia 10/30/2020   Tobacco abuse 10/30/2020   Leukocytosis 10/30/2020   Chronic combined systolic and diastolic CHF (congestive heart failure) ([place]) 10/30/2020   Ischemic cardiomyopathy 10/24/2020   AKI (acute kidney injury) ([place]) 10/17/2020   PAD (peripheral artery disease) ([place]) 10/16/2020    Critical lower limb ischemia ([place]) 10/05/2020   Rest pain of left upper extremity due to atherosclerosis ([place]) 10/05/2020   Severe claudication ([place]) 09/20/2020   Paresthesia of both lower extremities 09/20/2020   H/O cardiac arrest 09/20/2020   H/O acute myocardial infarction 08/14/2020   Snoring 08/14/2020   Sinus pause 08/14/2020   Mixed hyperlipidemia 08/14/2020   Tobacco dependence 08/14/2020   Coronary artery disease involving native coronary artery of native heart without angina pectoris 08/10/2020   HFrEF (heart failure with reduced ejection fraction) ([place]) 08/10/2020   Nonrheumatic mitral valve regurgitation 08/10/2020   STEMI (ST elevation myocardial infarction) ([place]) 08/02/2020   Accelerated hypertension 08/01/2020    Jamey Reas, PT, DPT 07/08/2021, 10:34 AM  [facility] [address] [place], [place], [address] Phone: [phone]   Fax:  [phone]  Name: Mike [name] MRN: [id number] Date of Birth: January 25, 1961

## 2021-07-09 ENCOUNTER — Ambulatory Visit: Payer: BC Managed Care – PPO | Admitting: Neurology

## 2021-07-10 ENCOUNTER — Other Ambulatory Visit: Payer: Self-pay

## 2021-07-10 ENCOUNTER — Encounter: Payer: BC Managed Care – PPO | Admitting: Physical Therapy

## 2021-07-10 DIAGNOSIS — Q2730 Arteriovenous malformation, site unspecified: Secondary | ICD-10-CM

## 2021-07-11 DIAGNOSIS — Z95818 Presence of other cardiac implants and grafts: Secondary | ICD-10-CM | POA: Diagnosis not present

## 2021-07-11 DIAGNOSIS — Z4509 Encounter for adjustment and management of other cardiac device: Secondary | ICD-10-CM | POA: Diagnosis not present

## 2021-07-11 DIAGNOSIS — I5020 Unspecified systolic (congestive) heart failure: Secondary | ICD-10-CM | POA: Diagnosis not present

## 2021-07-12 ENCOUNTER — Other Ambulatory Visit: Payer: Self-pay | Admitting: Cardiology

## 2021-07-12 DIAGNOSIS — I5020 Unspecified systolic (congestive) heart failure: Secondary | ICD-10-CM

## 2021-07-15 ENCOUNTER — Encounter: Payer: BC Managed Care – PPO | Admitting: Physical Therapy

## 2021-07-15 ENCOUNTER — Other Ambulatory Visit: Payer: Self-pay

## 2021-07-15 DIAGNOSIS — I5020 Unspecified systolic (congestive) heart failure: Secondary | ICD-10-CM

## 2021-07-15 MED ORDER — ENTRESTO 49-51 MG PO TABS: 1.0000 | ORAL_TABLET | Freq: Two times a day (BID) | ORAL | 2 refills | Status: DC

## 2021-07-16 ENCOUNTER — Telehealth: Payer: Self-pay | Admitting: Cardiology

## 2021-07-17 ENCOUNTER — Encounter: Payer: BC Managed Care – PPO | Admitting: Physical Therapy

## 2021-07-18 ENCOUNTER — Other Ambulatory Visit: Payer: Self-pay

## 2021-07-19 ENCOUNTER — Other Ambulatory Visit: Payer: Self-pay

## 2021-07-19 ENCOUNTER — Ambulatory Visit (HOSPITAL_COMMUNITY)
Admission: RE | Admit: 2021-07-19 | Discharge: 2021-07-19 | Disposition: A | Discharge status: Home / Self Care | Payer: BC Managed Care – PPO | Source: Ambulatory Visit | Attending: Interventional Radiology | Admitting: Interventional Radiology

## 2021-07-19 DIAGNOSIS — Q2730 Arteriovenous malformation, site unspecified: Secondary | ICD-10-CM | POA: Diagnosis not present

## 2021-07-19 DIAGNOSIS — J432 Centrilobular emphysema: Secondary | ICD-10-CM | POA: Diagnosis not present

## 2021-07-19 DIAGNOSIS — I2510 Atherosclerotic heart disease of native coronary artery without angina pectoris: Secondary | ICD-10-CM | POA: Diagnosis not present

## 2021-07-19 DIAGNOSIS — I700 Atherosclerosis of aorta: Secondary | ICD-10-CM | POA: Diagnosis not present

## 2021-07-19 LAB — POCT I-STAT CREATININE: Creatinine, Ser: 1.1 mg/dL (ref 0.61–1.24)

## 2021-07-19 MED ORDER — IOHEXOL 350 MG/ML SOLN
80.0000 mL | Freq: Once | INTRAVENOUS | Status: AC | PRN
  Administered 2021-07-19: 80 mL via INTRAVENOUS

## 2021-07-22 ENCOUNTER — Encounter: Payer: BC Managed Care – PPO | Admitting: Physical Therapy

## 2021-07-24 ENCOUNTER — Encounter: Payer: BC Managed Care – PPO | Admitting: Physical Therapy

## 2021-07-25 ENCOUNTER — Other Ambulatory Visit: Payer: Self-pay

## 2021-07-25 ENCOUNTER — Ambulatory Visit
Admission: RE | Admit: 2021-07-25 | Discharge: 2021-07-25 | Disposition: A | Discharge status: Home / Self Care | Payer: BC Managed Care – PPO | Source: Ambulatory Visit | Attending: Interventional Radiology | Admitting: Interventional Radiology

## 2021-07-25 DIAGNOSIS — Q2730 Arteriovenous malformation, site unspecified: Secondary | ICD-10-CM

## 2021-07-25 DIAGNOSIS — Q2572 Congenital pulmonary arteriovenous malformation: Secondary | ICD-10-CM | POA: Diagnosis not present

## 2021-07-25 DIAGNOSIS — Z9889 Other specified postprocedural states: Secondary | ICD-10-CM | POA: Diagnosis not present

## 2021-07-25 HISTORY — PX: IR RADIOLOGIST EVAL & MGMT: IMG5224

## 2021-07-25 NOTE — Progress Notes (Signed)
Chief Complaint: Patient was consulted remotely today (TeleHealth) for follow up after occlusion of a pulmonary AV malformation.  History of Present Illness: Mike [name] is a 60 y.o. male status post pulmonary arteriography with transcatheter occlusion of a left lower lobe pulmonary AV malformation/fistula on 03/20/2021.  He continues to feel well and denies any chest or neurologic symptoms.  He had implantation of a loop recorder by Dr. Einar Gip on 05/10/2021.  He has followed up with Dr. Virgina Jock and is due for another echocardiogram in September. A follow-up CTA of the chest was performed recently on 07/19/2021.  Past Medical History:  Diagnosis Date   Anxiety    CAD (coronary artery disease)    Cryptogenic stroke ([place]) 05/10/2021   Heart attack ([place]) 08/01/2020   Heart failure with reduced ejection fraction [facility])    Hyperlipidemia    Hypertension    Loop [employer] [id number] 05/10/2021 05/10/2021   PAD (peripheral artery disease) ([place])    right CIA & right distal iliac artery stents 10/16/20; left AKA 11/06/20   PFO (patent foramen ovale) 12/14/2020   Pneumonia    Pulmonary AV (arteriovenous) fistula ([place]) 12/14/2020   Stroke ([place]) 12/11/2020   no residuial effects    Past Surgical History:  Procedure Laterality Date   ABDOMINAL AORTOGRAM W/LOWER EXTREMITY N/A 10/16/2020   Procedure: ABDOMINAL AORTOGRAM W/LOWER EXTREMITY;  Surgeon: Nigel Mormon, MD;  Location: [place] [facility];  Service: Cardiovascular;  Laterality: N/A;   AMPUTATION Left 11/06/2020   Procedure: AMPUTATION ABOVE KNEE;  Surgeon: Rosetta Posner, MD;  Location: [place];  Service: Vascular;  Laterality: Left;   BUBBLE STUDY  12/14/2020   Procedure: BUBBLE STUDY;  Surgeon: Nigel Mormon, MD;  Location: [place];  Service: Cardiovascular;;   CARDIAC CATHETERIZATION     CORONARY STENT INTERVENTION N/A 08/03/2020   Procedure: CORONARY STENT INTERVENTION;  Surgeon: Nigel Mormon, MD;   Location: [place] [facility];  Service: Cardiovascular;  Laterality: N/A;   CORONARY/GRAFT ACUTE MI REVASCULARIZATION N/A 08/01/2020   Procedure: Coronary/Graft Acute MI Revascularization;  Surgeon: Nigel Mormon, MD;  Location: [place] [facility];  Service: Cardiovascular;  Laterality: N/A;   INTRAVASCULAR LITHOTRIPSY Right 10/16/2020   Procedure: INTRAVASCULAR LITHOTRIPSY;  Surgeon: Nigel Mormon, MD;  Location: [place] [facility];  Service: Cardiovascular;  Laterality: Right;  Common and External Iliac   IR ANGIOGRAM PULMONARY LEFT SELECTIVE  03/20/2021   IR ANGIOGRAM SELECTIVE EACH ADDITIONAL VESSEL  03/20/2021   IR EMBO ARTERIAL NOT HEMORR HEMANG INC GUIDE ROADMAPPING  03/20/2021   IR RADIOLOGIST EVAL & MGMT  01/30/2021   IR RADIOLOGIST EVAL & MGMT  04/18/2021   IR US GUIDE VASC ACCESS RIGHT  03/20/2021   LEFT HEART CATH AND CORONARY ANGIOGRAPHY N/A 08/01/2020   Procedure: LEFT HEART CATH AND CORONARY ANGIOGRAPHY;  Surgeon: Nigel Mormon, MD;  Location: [place] [facility];  Service: Cardiovascular;  Laterality: N/A;   LEFT HEART CATH AND CORONARY ANGIOGRAPHY N/A 08/03/2020   Procedure: LEFT HEART CATH AND CORONARY ANGIOGRAPHY;  Surgeon: Nigel Mormon, MD;  Location: [place] [facility];  Service: Cardiovascular;  Laterality: N/A;   PERIPHERAL VASCULAR INTERVENTION Right 10/16/2020   Procedure: PERIPHERAL VASCULAR INTERVENTION;  Surgeon: Nigel Mormon, MD;  Location: [place] [facility];  Service: Cardiovascular;  Laterality: Right;  Common and Iliac Stent   RADIOLOGY WITH ANESTHESIA N/A 03/20/2021   Procedure: PULMONARY EMBOLIZATION;  Surgeon: Aletta Edouard, MD;  Location: [place];  Service: Radiology;  Laterality: N/A;   TEE WITHOUT CARDIOVERSION N/A 12/14/2020   Procedure: TRANSESOPHAGEAL ECHOCARDIOGRAM (TEE);  Surgeon: Nigel Mormon, MD;  Location: [facility];  Service: Cardiovascular;  Laterality: N/A;    Allergies: Patient has no known  allergies.  Medications: Prior to Admission medications   Medication Sig Start Date End Date Taking? Authorizing Provider  acetaminophen (TYLENOL) 500 MG tablet Take 500-1,000 mg by mouth every 6 (six) hours as needed (pain).    [provider]  clopidogrel (PLAVIX) 75 MG tablet TAKE 1 TABLET(75 MG) BY MOUTH DAILY Patient taking differently: Take 75 mg by mouth every evening. 02/07/21   Patwardhan, Reynold Bowen, MD  furosemide (LASIX) 20 MG tablet TAKE 1 TABLET BY MOUTH EVERY DAY 07/12/21   Patwardhan, Manish J, MD  gabapentin (NEURONTIN) 300 MG capsule Take 1 capsule (300 mg total) by mouth 2 (two) times daily. Patient taking differently: Take 300 mg by mouth in the morning and at bedtime. 11/15/20   Little Ishikawa, MD  metoprolol succinate (TOPROL-XL) 25 MG 24 hr tablet Take 1 tablet (25 mg total) by mouth daily. Take with or immediately following a meal. Patient taking differently: Take 25 mg by mouth every evening. Take with or immediately following a meal. 12/20/20 06/07/21  Patwardhan, Reynold Bowen, MD  Multiple Vitamins-Minerals (MULTI FOR HIM 50+) TABS Take 1 tablet by mouth See admin instructions.    [provider]  nitroGLYCERIN (NITROSTAT) 0.4 MG SL tablet Place 1 tablet (0.4 mg total) under the tongue every 5 (five) minutes as needed for chest pain. Patient taking differently: Place 0.4 mg under the tongue every 5 (five) minutes as needed for chest pain (max 3 doses). 08/14/20 06/07/21  Patwardhan, Reynold Bowen, MD  rivaroxaban (XARELTO) 2.5 MG TABS tablet Take 1 tablet (2.5 mg total) by mouth 2 (two) times daily. 04/15/21   Patwardhan, Reynold Bowen, MD  rosuvastatin (CRESTOR) 10 MG tablet TAKE 1 TABLET(10 MG) BY MOUTH DAILY Patient taking differently: Take 10 mg by mouth every evening. 02/07/21   Patwardhan, Manish J, MD  sacubitril-valsartan (ENTRESTO) 49-51 MG Take 1 tablet by mouth 2 (two) times daily. 07/15/21   Patwardhan, Reynold Bowen, MD     Family History  Problem Relation  Age of Onset   Heart disease Mother    Heart disease Father    Cancer Brother     Social History   Socioeconomic History   Marital status: Single    Spouse name: Not on file   Number of children: 1   Years of education: BA   Highest education level: Not on file  Occupational History   Occupation: Disability  Tobacco Use   Smoking status: Some Days    Packs/day: 0.50    Years: 40.00    Pack years: 20.00    Types: Cigarettes   Smokeless tobacco: Never   Tobacco comments:    3 - 7 a day  Vaping Use   Vaping Use: Never used  Substance and Sexual Activity   Alcohol use: Yes    Comment: ocassional since MI    Drug use: Never   Sexual activity: Not on file  Other Topics Concern   Not on file  Social History Narrative   Left handed   Caffeine use: 3-4 sodas per day, tea very rare.    Social Determinants of Health   Financial Resource Strain: Not on file  Food Insecurity: Not on file  Transportation Needs: Not on file  Physical Activity: Not on  file  Stress: Not on file  Social Connections: Not on file    Review of Systems  Constitutional: Negative.   Respiratory: Negative.    Cardiovascular: Negative.   Genitourinary: Negative.   Musculoskeletal: Negative.   Neurological: Negative.    Review of Systems: A 12 point ROS discussed and pertinent positives are indicated in the HPI above.  All other systems are negative.  Physical Exam No direct physical exam was performed (except for noted visual exam findings with Video Visits).  Vital Signs: There were no vitals taken for this visit.  Imaging: CT Angio Chest Pulmonary Embolism (PE) W or WO Contrast  Result Date: 07/19/2021 CLINICAL DATA:  Follow up AVM status post embolization procedure 03/20/2021. No current complaints. EXAM: CT ANGIOGRAPHY CHEST WITH CONTRAST TECHNIQUE: Multidetector CT imaging of the chest was performed using the standard protocol during bolus administration of intravenous contrast.  Multiplanar CT image reconstructions and MIPs were obtained to evaluate the vascular anatomy. CONTRAST:  59m OMNIPAQUE IOHEXOL 350 MG/ML SOLN COMPARISON:  Chest CTA 12/14/2020 FINDINGS: Cardiovascular: The pulmonary arteries are well opacified with contrast to the level of the subsegmental branches. There is no evidence of acute pulmonary embolism. The venous branches associated with the medial left lower lobe AVM on the prior study are significantly smaller and without significant residual enhancement or abnormal drainage into the left atrium to suggest residual AVM. Limited opacification of the aorta without acute systemic arterial abnormalities. There is mild aortic, great vessel and coronary artery atherosclerosis. The heart size is normal. There is no pericardial effusion. Mediastinum/Nodes: There are no enlarged mediastinal, hilar or axillary lymph nodes. The thyroid gland, trachea and esophagus demonstrate no significant findings. Lungs/Pleura: No pleural effusion or pneumothorax. Mild centrilobular emphysema. Stable 3 mm perifissural nodule associated with the minor fissure (image 85/11), consistent with a benign finding. No suspicious pulmonary nodules. Upper abdomen: The visualized upper abdomen appears stable without significant findings. Musculoskeletal/Chest wall: There is no chest wall mass or suspicious osseous finding. Loop recorder noted in the left anterior chest wall. Review of the MIP images confirms the above findings. IMPRESSION: 1. Interval successful embolization of previously demonstrated left lower lobe arteriovenous malformation. 2. No acute vascular findings. Coronary and Aortic Atherosclerosis (ICD10-I70.0). 3.  Emphysema (ICD10-J43.9). Electronically Signed   By: WRichardean SaleM.D.   On: 07/19/2021 11:20    Labs:  CBC: Recent Labs    12/11/20 1020 12/11/20 1027 12/12/20 0130 12/14/20 0450 03/20/21 0649  WBC 7.7  --  8.2 7.5 8.3  HGB 12.0* 12.6* 11.0* 11.6* 13.5  HCT  40.3 37.0* 36.3* 35.5* 42.1  PLT 221  --  214 202 212    COAGS: Recent Labs    10/30/20 1822 12/11/20 1020 03/20/21 0649  INR 1.2 1.0 1.0  APTT 31 29  --     BMP: Recent Labs    09/25/20 0903 10/17/20 0825 10/22/20 1054 10/26/20 1004 10/30/20 1652 12/13/20 0129 12/14/20 0450 01/31/21 1304 03/20/21 0649 07/19/21 1021  NA 140   < > CANCELED 135   < > 141 140 145* 139  --   K 4.5   < > CANCELED 3.9   < > 3.7 3.9 3.8 3.8  --   CL 108*   < > CANCELED 98   < > 110 110 108* 109  --   CO2 18*   < > CANCELED 11*   < > 21* '22 20 23  ' --   GLUCOSE 102*   < > 135* 210*   < >  96 100* 118* 85  --   BUN 15   < > 67* 79*   < > '11 13 22 19  ' --   CALCIUM 9.4   < > CANCELED 9.1   < > 8.9 8.6* 8.9 9.0  --   CREATININE 1.16   < > 6.34* 5.36*   < > 1.11 1.09 1.00 1.05 1.10  GFRNONAA 68   < > 9* 11*   < > >60 >60 81 >60  --   GFRAA 78  --  10* 12*  --   --   --  93  --   --    < > = values in this interval not displayed.    LIVER FUNCTION TESTS: Recent Labs    11/13/20 0409 11/14/20 0236 11/15/20 0338 12/11/20 1020  BILITOT 0.6 0.5 0.4 0.8  AST 50* 47* 37 17  ALT 75* 70* 61* 10  ALKPHOS 55 54 62 76  PROT 5.5* 5.0* 5.0* 6.5  ALBUMIN 1.9* 1.8* 1.8* 3.0*     Assessment and Plan:  I met with Mr. Reinitz remotely via telehealth audio and video link and reviewed the recent CTA findings with him as well as imaging from the arteriographic occlusion procedure in March.  Follow-up CTA demonstrates complete occlusion of arterial inflow into the left lower lobe AV fistula after occluder device placement with atretic appearance of the fistula and venous outflow.  No evidence of pulmonary infarct or other complication following the procedure.  Given appearance, I recommended a follow-up CTA of the chest in approximately 2 years.  Electronically Signed: Azzie Roup 07/25/2021, 2:46 PM    I spent a total of 15 Minutes in remote  clinical consultation, greater than 50% of which was  counseling/coordinating care status post occlusion of a left lower lobe pulmonary arteriovenous malformation.    Visit type: Audio and video (WebEx).   Alternative for in-person consultation at [facility], [address] [place], [place], [place]. This visit type was conducted due to national recommendations for restrictions regarding the COVID-19 Pandemic (e.g. social distancing).  This format is felt to be most appropriate for this patient at this time.  All issues noted in this document were discussed and addressed.

## 2021-07-26 ENCOUNTER — Encounter: Payer: Self-pay | Admitting: Cardiology

## 2021-07-26 ENCOUNTER — Other Ambulatory Visit: Payer: Self-pay

## 2021-07-26 ENCOUNTER — Other Ambulatory Visit: Payer: Self-pay | Admitting: Cardiology

## 2021-07-26 ENCOUNTER — Ambulatory Visit: Payer: BC Managed Care – PPO | Admitting: Cardiology

## 2021-07-26 DIAGNOSIS — G4733 Obstructive sleep apnea (adult) (pediatric): Secondary | ICD-10-CM | POA: Diagnosis not present

## 2021-07-26 DIAGNOSIS — Z4509 Encounter for adjustment and management of other cardiac device: Secondary | ICD-10-CM | POA: Diagnosis not present

## 2021-07-26 DIAGNOSIS — Z95818 Presence of other cardiac implants and grafts: Secondary | ICD-10-CM | POA: Diagnosis not present

## 2021-07-26 DIAGNOSIS — I455 Other specified heart block: Secondary | ICD-10-CM

## 2021-07-26 DIAGNOSIS — R0683 Snoring: Secondary | ICD-10-CM

## 2021-07-26 DIAGNOSIS — I639 Cerebral infarction, unspecified: Secondary | ICD-10-CM | POA: Diagnosis not present

## 2021-07-26 HISTORY — DX: Encounter for adjustment and management of other cardiac device: Z45.09

## 2021-07-26 NOTE — Patient Instructions (Signed)
You should hear from sleep doctor within 1 week, please call us

## 2021-07-26 NOTE — Progress Notes (Signed)
Chief Complaint  Patient presents with   Loop recorder program      ICD-10-CM   1. Encounter for loop recorder check  Z45.09     2. Cryptogenic stroke (HCC)  I63.9     3. Loop [employer] [id number] 05/10/2021  Z95.818     4. Sinus arrest  I45.5     5. Obstructive sleep apnea  G47.33      Scheduled in person loop recorder check 07/26/2021:  R wave amplitude 0.7 to 0.9 mV. Programmed AF detection to Medium. HVR 180/min . Bradycardia 30 BPM, duration 30 Sec Sudden rate drop OFF Asystole Changed from 4 Sec to 6 Sec Patient triggered: On  Likely patient is having frequent episodes of sinus arrest and 4 to 5 seconds of pause, all occurring at night and asymptomatic.  Hence due to frequent alerts, changed his settings to 6 seconds of pausing prior to transmissions of alerts.  He will also be scheduled for sleep study, patient is now willing to proceed.   F/U Dr. Joya Gaskins Patwardhan 09/20/2021 9:45 AM   Adrian Prows, MD, [facility] 07/26/2021, 10:07 AM Office: [phone] Fax: [phone] Pager: [phone]

## 2021-07-31 ENCOUNTER — Ambulatory Visit (INDEPENDENT_AMBULATORY_CARE_PROVIDER_SITE_OTHER): Payer: BC Managed Care – PPO | Admitting: Physical Therapy

## 2021-07-31 ENCOUNTER — Other Ambulatory Visit: Payer: Self-pay

## 2021-07-31 ENCOUNTER — Encounter: Payer: Self-pay | Admitting: Physical Therapy

## 2021-07-31 DIAGNOSIS — R2689 Other abnormalities of gait and mobility: Secondary | ICD-10-CM

## 2021-07-31 DIAGNOSIS — R2681 Unsteadiness on feet: Secondary | ICD-10-CM

## 2021-07-31 DIAGNOSIS — R293 Abnormal posture: Secondary | ICD-10-CM

## 2021-07-31 DIAGNOSIS — M6281 Muscle weakness (generalized): Secondary | ICD-10-CM

## 2021-07-31 DIAGNOSIS — M25652 Stiffness of left hip, not elsewhere classified: Secondary | ICD-10-CM

## 2021-07-31 NOTE — Therapy (Signed)
[facility] Physical Therapy [address] [place], [place], [address] Phone: [phone]   Fax:  [phone]  Physical Therapy Treatment  Patient Details  Name: Mike [name] MRN: [id number] Date of Birth: Feb 10, 1961 Referring Provider (PT): Meridee Score, MD   Encounter Date: 07/31/2021   PT End of Session - 07/31/21 0936     Visit Number 12    Number of Visits 21    Date for PT Re-Evaluation 08/09/21    Authorization Type BCBS    Authorization Time Period OOP & Deductible MET    PT Start Time 0931    PT Stop Time 1015    PT Time Calculation (min) 44 min    Equipment Utilized During Treatment Gait belt    Activity Tolerance Patient tolerated treatment well    Behavior During Therapy [facility] for tasks assessed/performed             Past Medical History:  Diagnosis Date   Anxiety    CAD (coronary artery disease)    Cryptogenic stroke ([place]) 05/10/2021   Encounter for loop recorder check 07/26/2021   Heart attack ([place]) 08/01/2020   Heart failure with reduced ejection fraction [facility])    Hyperlipidemia    Hypertension    Loop [employer] [id number] 05/10/2021 05/10/2021   PAD (peripheral artery disease) ([place])    right CIA & right distal iliac artery stents 10/16/20; left AKA 11/06/20   PFO (patent foramen ovale) 12/14/2020   Pneumonia    Pulmonary AV (arteriovenous) fistula ([place]) 12/14/2020   Stroke ([place]) 12/11/2020   no residuial effects    Past Surgical History:  Procedure Laterality Date   ABDOMINAL AORTOGRAM W/LOWER EXTREMITY N/A 10/16/2020   Procedure: ABDOMINAL AORTOGRAM W/LOWER EXTREMITY;  Surgeon: Nigel Mormon, MD;  Location: [place] CV LAB;  Service: Cardiovascular;  Laterality: N/A;   AMPUTATION Left 11/06/2020   Procedure: AMPUTATION ABOVE KNEE;  Surgeon: Rosetta Posner, MD;  Location: [place];  Service: Vascular;  Laterality: Left;   BUBBLE STUDY  12/14/2020   Procedure: BUBBLE STUDY;  Surgeon: Nigel Mormon, MD;  Location: [place];  Service: Cardiovascular;;   CARDIAC CATHETERIZATION     CORONARY STENT INTERVENTION N/A 08/03/2020   Procedure: CORONARY STENT INTERVENTION;  Surgeon: Nigel Mormon, MD;  Location: [place] CV LAB;  Service: Cardiovascular;  Laterality: N/A;   CORONARY/GRAFT ACUTE MI REVASCULARIZATION N/A 08/01/2020   Procedure: Coronary/Graft Acute MI Revascularization;  Surgeon: Nigel Mormon, MD;  Location: [place] CV [facility];  Service: Cardiovascular;  Laterality: N/A;   INTRAVASCULAR LITHOTRIPSY Right 10/16/2020   Procedure: INTRAVASCULAR LITHOTRIPSY;  Surgeon: Nigel Mormon, MD;  Location: [place] CV LAB;  Service: Cardiovascular;  Laterality: Right;  Common and External Iliac   IR ANGIOGRAM PULMONARY LEFT SELECTIVE  03/20/2021   IR ANGIOGRAM SELECTIVE EACH ADDITIONAL VESSEL  03/20/2021   IR EMBO ARTERIAL NOT HEMORR HEMANG INC GUIDE ROADMAPPING  03/20/2021   IR RADIOLOGIST EVAL & MGMT  01/30/2021   IR RADIOLOGIST EVAL & MGMT  04/18/2021   IR RADIOLOGIST EVAL & MGMT  07/25/2021   IR US GUIDE VASC ACCESS RIGHT  03/20/2021   LEFT HEART CATH AND CORONARY ANGIOGRAPHY N/A 08/01/2020   Procedure: LEFT HEART CATH AND CORONARY ANGIOGRAPHY;  Surgeon: Nigel Mormon, MD;  Location: [place] CV [facility];  Service: Cardiovascular;  Laterality: N/A;   LEFT HEART CATH AND CORONARY ANGIOGRAPHY N/A 08/03/2020   Procedure: LEFT HEART CATH AND CORONARY ANGIOGRAPHY;  Surgeon: Nigel Mormon, MD;  Location: [place] [facility];  Service: Cardiovascular;  Laterality: N/A;   PERIPHERAL VASCULAR INTERVENTION Right 10/16/2020   Procedure: PERIPHERAL VASCULAR INTERVENTION;  Surgeon: Nigel Mormon, MD;  Location: [place] CV [facility];  Service: Cardiovascular;  Laterality: Right;  Common and Iliac Stent   RADIOLOGY WITH ANESTHESIA N/A 03/20/2021   Procedure: PULMONARY EMBOLIZATION;  Surgeon: Aletta Edouard, MD;  Location: [place];  Service: Radiology;  Laterality: N/A;   TEE WITHOUT  CARDIOVERSION N/A 12/14/2020   Procedure: TRANSESOPHAGEAL ECHOCARDIOGRAM (TEE);  Surgeon: Nigel Mormon, MD;  Location: [facility] ENDOSCOPY;  Service: Cardiovascular;  Laterality: N/A;    There were no vitals filed for this visit.   Subjective Assessment - 07/31/21 0931     Subjective He got forearm crutches on Friday.  He was exposed to individual with Covid but he tested negative 3 times and had no symptoms.    Pertinent History PAD, gangrene s/p L AKA 11/06/20, HTN, CAD s/p STEMI with stenting 08/17/2020, and CHF. cryptogenic stroke Dec 2021    Patient Stated Goals to return to work, (he was released from current job over last weekend but wants to find job).  to walk in community,    Currently in Pain? No/denies    Pain Onset More than a month ago                               [facility] Adult PT Treatment/Exercise - 07/31/21 0931       Transfers   Transfers Sit to Stand;Stand to Sit    Sit to Stand 5: Supervision;With upper extremity assist;From chair/3-in-1   without armrests using UEs on chair not touching //bars to stabiize   Sit to Stand Details Verbal cues for technique;Verbal cues for safe use of DME/AE    Stand to Sit 5: Supervision;With upper extremity assist;To chair/3-in-1   to chairs without armrests using UEs on chair, not touching //bars for stability   Stand to Sit Details (indicate cue type and reason) Verbal cues for safe use of DME/AE;Verbal cues for technique      Ambulation/Gait   Ambulation/Gait Yes    Ambulation/Gait Assistance 5: Supervision;3: Mod assist;4: Min guard   supervision 2 crutches, min guard single rail in //bars, ModA single crutch + HHA   Ambulation/Gait Assistance Details verbal cues on upright posture, shift over prosthesis in stance & sequence for 4-pt pattern    Ambulation Distance (Feet) 200 Feet   200' w/2 crutches, 50' X 3 w/ right crutch   Assistive device Lofstrands;Prosthesis;R Forearm Crutch    Ramp 5: Supervision    forearm crutches & TFA prosthesis   Curb 5: Supervision   with forearm crutches & TFA prosthesis.     High Level Balance   High Level Balance Activities Side stepping;Turns;Other (comment);Backward walking   90* turns from stationary stance, 360* quareter turns   High Level Balance Comments in //bars with RUE crutch & LUE bar intermittent support with cues to minimize weight bearing,  demo, verbal & tactile cues on technique including engaging prosthesis.      Neuro Re-ed    Neuro Re-ed Details  standing in //bars without UE support - blue theraband 10 reps ea - alternating & BUEs forward reach, rows, & overhead reach.      Exercises   Exercises Knee/Hip      Knee/Hip Exercises: Aerobic   Tread Mill --      Knee/Hip Exercises: Machines for  Strengthening   Cybex Leg Press --      Knee/Hip Exercises: Standing   Step Down Left;1 set;15 reps;Hand Hold: 2;Step Height: 6"   worked on riding hydraulics of prosthetic knee to control descent.   Step Down Limitations PT demo & verbal cues on technique.      Prosthetics   Prosthetic Care Comments  increase wear to most of awake hours    Current prosthetic wear tolerance (days/week)  daily    Current prosthetic wear tolerance (#hours/day)  10-11 hrs total / day between 2 wears.    Education Provided Proper wear schedule/adjustment;Correct ply sock adjustment;Other (comment)   see prosthetic care comments   Person(s) Educated Patient    Education Method Explanation;Verbal cues    Education Method Verbalized understanding;Verbal cues required    Donning Prosthesis Modified independent (device/increased time)                      PT Short Term Goals - 07/31/21 1219       PT SHORT TERM GOAL #1   Title Patient verbalizes proper adjustment of ply socks.    Time 1    Period Months    Status Achieved    Target Date 06/28/21      PT SHORT TERM GOAL #2   Title Patient tolerates prosthesis >12 hrs total /day without skin issues or  limb pain    Time 1    Period Months    Status Partially Met    Target Date 06/28/21      PT SHORT TERM GOAL #3   Title Patient able to scan right/left & up/down maintaining equal weight bearing BLEs.    Time 1    Period Months    Status Achieved    Target Date 06/28/21      PT SHORT TERM GOAL #4   Title Patient ambulates 200' with forearm crutches & prosthesis with supervision with proper prosthetic knee control.    Time 1    Period Months    Status Achieved    Target Date 06/28/21      PT SHORT TERM GOAL #5   Title Patient negotiates ramps & curbs with forearm crutches & prosthesis with supervision.    Time 1    Period Months    Status Achieved    Target Date 06/28/21               PT Long Term Goals - 05/22/21 1606       PT LONG TERM GOAL #1   Title Patient demonstrates & verbalized understanding of prosthetic care to enable safe utilization of prosthesis.    Time 10    Period Weeks    Status New    Target Date 08/09/21      PT LONG TERM GOAL #2   Title Patient tolerates prosthesis wear >90% of awake hours without skin or limb pain issues.    Time 10    Period Weeks    Status New    Target Date 08/09/21      PT LONG TERM GOAL #3   Title Berg Balance >/= 45/56 to indicate lower fall risk    Time 10    Period Weeks    Status New    Target Date 08/09/21      PT LONG TERM GOAL #4   Title Patient ambulates 500' outdoors with LRAD & prosthesis modified independent    Time 10    Period Weeks  Status New    Target Date 08/09/21      PT LONG TERM GOAL #5   Title Patient negotiates ramps, curbs & stairs with LRAD & prosthesis modified independent.    Time 10    Period Weeks    Status New    Target Date 08/09/21      Additional Long Term Goals   Additional Long Term Goals Yes      PT LONG TERM GOAL #6   Title Patient demo proper lifting, carrying up to 30#, push/pull task and climbing ladder for return to work skills.    Time 10    Period Weeks     Status New    Target Date 08/09/21                   Plan - 07/31/21 4854     Clinical Impression Statement Patient met STGs except wear not fully to 12 hrs yet.  PT worked on balance activities with single crutch & intermittent LUE which are improving.  PT educated how to perform at home near counter which he seems to understand.  He will probably need recertification next week as progressing but has been limited by acute illnesses and missed appts.    Personal Factors and Comorbidities Comorbidity 3+;Fitness;Time since onset of injury/illness/exacerbation    Comorbidities PAD, gangrene s/p L AKA 11/06/20, HTN, CAD s/p STEMI with stenting 08/17/2020, and CHF. cryptogenic stroke Dec 2021    Examination-Activity Limitations Lift;Locomotion Level;Squat;Stairs;Stand;Transfers    Examination-Participation Restrictions Community Activity;Occupation    Stability/Clinical Decision Making Evolving/Moderate complexity    Rehab Potential Good    PT Frequency 2x / week    PT Duration Other (comment)   10 weeks   PT Treatment/Interventions ADLs/Self Care Home Management;DME Instruction;Gait training;Stair training;Functional mobility training;Therapeutic activities;Therapeutic exercise;Balance training;Neuromuscular re-education;Patient/family education;Prosthetic Training;Manual techniques;Passive range of motion    PT Next Visit Plan check LTGs over next 2 visits and do recertification,  balance & gait with forearm crutches,    Consulted and Agree with Plan of Care Patient             Patient will benefit from skilled therapeutic intervention in order to improve the following deficits and impairments:  Abnormal gait, Decreased activity tolerance, Decreased balance, Decreased endurance, Decreased knowledge of use of DME, Decreased mobility, Decreased range of motion, Decreased strength, Increased edema, Postural dysfunction, Prosthetic Dependency, Pain  Visit Diagnosis: Other  abnormalities of gait and mobility  Unsteadiness on feet  Muscle weakness (generalized)  Stiffness of left hip, not elsewhere classified  Abnormal posture     Problem List Patient Active Problem List   Diagnosis Date Noted   Encounter for loop recorder check 07/26/2021   Cryptogenic stroke ([place]) 05/10/2021   Loop [employer] [id number] 05/10/2021 05/10/2021   AVM (arteriovenous malformation) 03/20/2021   Pulmonary AV (arteriovenous) fistula ([place]) 02/20/2021   Pre-ulcerative calluses 12/20/2020   Cerebral thrombosis with cerebral infarction 12/11/2020   Cerebrovascular accident (CVA) due to embolism of cerebral artery ([place]) 12/11/2020   Pressure injury of skin 11/12/2020   Acute urinary retention 11/09/2020   Normal anion gap metabolic acidosis 93/81/8299   Hypotension 11/07/2020   Chest pain 10/31/2020   Acute on chronic HFrEF (heart failure with reduced ejection fraction) ([place])    Goals of care, counseling/discussion    Palliative care by specialist    DNR (do not resuscitate)    Critical limb ischemia with history of revascularization of same extremity ([place]) 10/30/2020   Hyperglycemia 10/30/2020  Tobacco abuse 10/30/2020   Leukocytosis 10/30/2020   Chronic combined systolic and diastolic CHF (congestive heart failure) ([place]) 10/30/2020   Ischemic cardiomyopathy 10/24/2020   AKI (acute kidney injury) ([place]) 10/17/2020   PAD (peripheral artery disease) ([place]) 10/16/2020   Critical lower limb ischemia ([place]) 10/05/2020   Rest pain of left upper extremity due to atherosclerosis ([place]) 10/05/2020   Severe claudication ([place]) 09/20/2020   Paresthesia of both lower extremities 09/20/2020   H/O cardiac arrest 09/20/2020   H/O acute myocardial infarction 08/14/2020   Snoring 08/14/2020   Sinus pause 08/14/2020   Mixed hyperlipidemia 08/14/2020   Tobacco dependence 08/14/2020   Coronary artery disease involving native coronary artery of native heart without angina pectoris  08/10/2020   HFrEF (heart failure with reduced ejection fraction) ([place]) 08/10/2020   Nonrheumatic mitral valve regurgitation 08/10/2020   STEMI (ST elevation myocardial infarction) ([place]) 08/02/2020   Accelerated hypertension 08/01/2020    Jamey Reas, PT, DPT 07/31/2021, 12:25 PM  [facility] [address] [place], [place], [address] Phone: [phone]   Fax:  [phone]  Name: ORLANDUS BOROWSKI MRN: [id number] Date of Birth: 1961-09-16

## 2021-08-05 ENCOUNTER — Encounter: Payer: BC Managed Care – PPO | Admitting: Physical Therapy

## 2021-08-06 ENCOUNTER — Other Ambulatory Visit: Payer: Self-pay

## 2021-08-06 ENCOUNTER — Encounter: Payer: Self-pay | Admitting: Physical Therapy

## 2021-08-06 ENCOUNTER — Ambulatory Visit (INDEPENDENT_AMBULATORY_CARE_PROVIDER_SITE_OTHER): Payer: BC Managed Care – PPO | Admitting: Physical Therapy

## 2021-08-06 DIAGNOSIS — M25652 Stiffness of left hip, not elsewhere classified: Secondary | ICD-10-CM

## 2021-08-06 DIAGNOSIS — R2689 Other abnormalities of gait and mobility: Secondary | ICD-10-CM

## 2021-08-06 DIAGNOSIS — R293 Abnormal posture: Secondary | ICD-10-CM | POA: Diagnosis not present

## 2021-08-06 DIAGNOSIS — R2681 Unsteadiness on feet: Secondary | ICD-10-CM | POA: Diagnosis not present

## 2021-08-06 DIAGNOSIS — M6281 Muscle weakness (generalized): Secondary | ICD-10-CM | POA: Diagnosis not present

## 2021-08-06 NOTE — Therapy (Signed)
[facility] [address] [place], [place], [address] Phone: [phone]   Fax:  [phone]  Physical Therapy Treatment  Patient Details  Name: Mike [name] MRN: [id number] Date of Birth: 08/03/1961 Referring Provider (PT): Meridee Score, MD   Encounter Date: 08/06/2021   PT End of Session - 08/06/21 1002     Visit Number 13    Number of Visits 21    Date for PT Re-Evaluation 08/09/21    Authorization Type BCBS    Authorization Time Period OOP & Deductible MET    PT Start Time 1002    PT Stop Time 1505    PT Time Calculation (min) 50 min    Equipment Utilized During Treatment Gait belt    Activity Tolerance Patient tolerated treatment well    Behavior During Therapy [facility] for tasks assessed/performed             Past Medical History:  Diagnosis Date   Anxiety    CAD (coronary artery disease)    Cryptogenic stroke ([place]) 05/10/2021   Encounter for loop recorder check 07/26/2021   Heart attack ([place]) 08/01/2020   Heart failure with reduced ejection fraction ([place])    Hyperlipidemia    Hypertension    Loop [employer] [id number] 05/10/2021 05/10/2021   PAD (peripheral artery disease) ([place])    right CIA & right distal iliac artery stents 10/16/20; left AKA 11/06/20   PFO (patent foramen ovale) 12/14/2020   Pneumonia    Pulmonary AV (arteriovenous) fistula ([place]) 12/14/2020   Stroke ([place]) 12/11/2020   no residuial effects    Past Surgical History:  Procedure Laterality Date   ABDOMINAL AORTOGRAM W/LOWER EXTREMITY N/A 10/16/2020   Procedure: ABDOMINAL AORTOGRAM W/LOWER EXTREMITY;  Surgeon: Nigel Mormon, MD;  Location: [place] [facility];  Service: Cardiovascular;  Laterality: N/A;   AMPUTATION Left 11/06/2020   Procedure: AMPUTATION ABOVE KNEE;  Surgeon: Rosetta Posner, MD;  Location: [place];  Service: Vascular;  Laterality: Left;   BUBBLE STUDY  12/14/2020   Procedure: BUBBLE STUDY;  Surgeon: Nigel Mormon, MD;  Location: [place];  Service: Cardiovascular;;   CARDIAC CATHETERIZATION     CORONARY STENT INTERVENTION N/A 08/03/2020   Procedure: CORONARY STENT INTERVENTION;  Surgeon: Nigel Mormon, MD;  Location: [place] [facility];  Service: Cardiovascular;  Laterality: N/A;   CORONARY/GRAFT ACUTE MI REVASCULARIZATION N/A 08/01/2020   Procedure: Coronary/Graft Acute MI Revascularization;  Surgeon: Nigel Mormon, MD;  Location: [place] [facility];  Service: Cardiovascular;  Laterality: N/A;   INTRAVASCULAR LITHOTRIPSY Right 10/16/2020   Procedure: INTRAVASCULAR LITHOTRIPSY;  Surgeon: Nigel Mormon, MD;  Location: [place] [facility];  Service: Cardiovascular;  Laterality: Right;  Common and External Iliac   IR ANGIOGRAM PULMONARY LEFT SELECTIVE  03/20/2021   IR ANGIOGRAM SELECTIVE EACH ADDITIONAL VESSEL  03/20/2021   IR EMBO ARTERIAL NOT HEMORR HEMANG INC GUIDE ROADMAPPING  03/20/2021   IR RADIOLOGIST EVAL & MGMT  01/30/2021   IR RADIOLOGIST EVAL & MGMT  04/18/2021   IR RADIOLOGIST EVAL & MGMT  07/25/2021   IR US GUIDE VASC ACCESS RIGHT  03/20/2021   LEFT HEART CATH AND CORONARY ANGIOGRAPHY N/A 08/01/2020   Procedure: LEFT HEART CATH AND CORONARY ANGIOGRAPHY;  Surgeon: Nigel Mormon, MD;  Location: [place] [facility];  Service: Cardiovascular;  Laterality: N/A;   LEFT HEART CATH AND CORONARY ANGIOGRAPHY N/A 08/03/2020   Procedure: LEFT HEART CATH AND CORONARY ANGIOGRAPHY;  Surgeon: Nigel Mormon, MD;  Location: [place] [facility];  Service: Cardiovascular;  Laterality: N/A;   PERIPHERAL VASCULAR INTERVENTION Right 10/16/2020   Procedure: PERIPHERAL VASCULAR INTERVENTION;  Surgeon: Nigel Mormon, MD;  Location: [place] [facility];  Service: Cardiovascular;  Laterality: Right;  Common and Iliac Stent   RADIOLOGY WITH ANESTHESIA N/A 03/20/2021   Procedure: PULMONARY EMBOLIZATION;  Surgeon: Aletta Edouard, MD;  Location: [place];  Service: Radiology;  Laterality: N/A;   TEE WITHOUT  CARDIOVERSION N/A 12/14/2020   Procedure: TRANSESOPHAGEAL ECHOCARDIOGRAM (TEE);  Surgeon: Nigel Mormon, MD;  [facility];  Service: Cardiovascular;  Laterality: N/A;    There were no vitals filed for this visit.   Subjective Assessment - 08/06/21 1001     Subjective He is wearing prosthesis again 10-11 hours of 15-16 awake hours.  no falls.    Pertinent History PAD, gangrene s/p L AKA 11/06/20, HTN, CAD s/p STEMI with stenting 08/17/2020, and CHF. cryptogenic stroke Dec 2021    Patient Stated Goals to return to work, (he was released from current job over last weekend but wants to find job).  to walk in community,    Currently in Pain? No/denies    Pain Onset More than a month ago                [facility] PT Assessment - 08/06/21 1003       Assessment   Medical Diagnosis Left Transfemoral Amputation    Referring Provider (PT) Meridee Score, MD    Onset Date/Surgical Date 05/21/21   prosthesis delivery     Ambulation/Gait   Ambulation Distance (Feet) 380 Feet    Gait velocity 1.37 ft/sec comfortable pace   initially 0.93 ft/sec     6 Minute Walk- Baseline   6 Minute Walk- Baseline yes    HR (bpm) 74    02 Sat (%RA) 98 %      6 Minute walk- Post Test   6 Minute Walk Post Test yes    HR (bpm) 117    02 Sat (%RA) 97 %    Modified Borg Scale for Dyspnea 5- Strong or hard breathing    Perceived Rate of Exertion (Borg) 13- Somewhat hard      6 minute walk test results    Aerobic Endurance Distance Walked 380    Endurance additional comments bil. forearm crutches with modified 4-point pattern, no rest breaks      High Level Balance   High Level Balance Activities --    High Level Balance Comments --      Standardized Balance Assessment   Standardized Balance Assessment Berg Balance Test;Timed Up and Go Test      Berg Balance Test   Sit to Stand Able to stand  independently using hands    Standing Unsupported Able to stand safely 2 minutes    Sitting with  Back Unsupported but Feet Supported on Floor or Stool Able to sit safely and securely 2 minutes    Stand to Sit Controls descent by using hands    Transfers Able to transfer safely, minor use of hands    Standing Unsupported with Eyes Closed Able to stand 10 seconds with supervision    Standing Unsupported with Feet Together Able to place feet together independently and stand for 1 minute with supervision    From Standing, Reach Forward with Outstretched Arm Can reach forward >12 cm safely (5")    From Standing Position, Pick up Object from Floor Unable to pick up shoe,  but reaches 2-5 cm (1-2") from shoe and balances independently    From Standing Position, Turn to Look Behind Over each Shoulder Turn sideways only but maintains balance    Turn 360 Degrees Needs assistance while turning    Standing Unsupported, Alternately Place Feet on Step/Stool Needs assistance to keep from falling or unable to try    Standing Unsupported, One Foot in Front Needs help to step but can hold 15 seconds    Standing on One Leg Tries to lift leg/unable to hold 3 seconds but remains standing independently    Total Score 33    Berg comment: initial was 24/56      Timed Up and Go Test   Normal TUG (seconds) 49.38   right forearm crutch with min guard   Cognitive TUG (seconds) 63.72   right forearm crutch with min guard, A to Z naming without stopping   TUG Comments 29% increase                           [facility] Adult PT Treatment/Exercise - 08/06/21 1003       Transfers   Transfers Sit to Stand;Stand to Sit    Sit to Stand 5: Supervision;With upper extremity assist;From chair/3-in-1   without armrests using UEs on chair not touching //bars to stabiize   Sit to Stand Details Verbal cues for technique;Verbal cues for safe use of DME/AE    Stand to Sit 5: Supervision;With upper extremity assist;To chair/3-in-1   to chairs without armrests using UEs on chair, not touching //bars for stability    Stand to Sit Details (indicate cue type and reason) Verbal cues for safe use of DME/AE;Verbal cues for technique      Ambulation/Gait   Ambulation/Gait Yes    Ambulation/Gait Assistance 5: Supervision;3: Mod assist;4: Min guard   supervision 2 crutches, min guard single rail in //bars, ModA single crutch + HHA   Ambulation/Gait Assistance Details verbal cues on left crutch timing & distance moved    Assistive device Lofstrands;Prosthesis;R Forearm Crutch    Stairs Yes    Stairs Assistance 4: Min guard    Stairs Assistance Details (indicate cue type and reason) worked on descending step "riding" hydraulics with step to pattern.  PT manual assist at knee for safety.  demo & verbal cues on technique.    Stair Management Technique One rail Right;With crutches;Step to pattern;Forwards    Number of Stairs 11    Height of Stairs 6    Ramp 5: Supervision   forearm crutches & TFA prosthesis   Curb 5: Supervision   with forearm crutches & TFA prosthesis.     Neuro Re-ed    Neuro Re-ed Details  standing in //bars without UE support - blue theraband 10 reps ea - alternating & BUEs forward reach, rows, & overhead reach.      Exercises   Exercises Knee/Hip      Knee/Hip Exercises: Standing   Step Down Left;1 set;15 reps;Hand Hold: 2;Step Height: 6"   worked on riding hydraulics of prosthetic knee to control descent.   Step Down Limitations PT demo & verbal cues on technique.      Prosthetics   Prosthetic Care Comments  increase wear to most of awake hours    Current prosthetic wear tolerance (days/week)  daily    Current prosthetic wear tolerance (#hours/day)  10-11 hrs total / day between 2 wears.    Education Provided Proper wear schedule/adjustment;Correct  ply sock adjustment;Other (comment)   see prosthetic care comments                     PT Short Term Goals - 07/31/21 1219       PT SHORT TERM GOAL #1   Title Patient verbalizes proper adjustment of ply socks.    Time 1     Period Months    Status Achieved    Target Date 06/28/21      PT SHORT TERM GOAL #2   Title Patient tolerates prosthesis >12 hrs total /day without skin issues or limb pain    Time 1    Period Months    Status Partially Met    Target Date 06/28/21      PT SHORT TERM GOAL #3   Title Patient able to scan right/left & up/down maintaining equal weight bearing BLEs.    Time 1    Period Months    Status Achieved    Target Date 06/28/21      PT SHORT TERM GOAL #4   Title Patient ambulates 200' with forearm crutches & prosthesis with supervision with proper prosthetic knee control.    Time 1    Period Months    Status Achieved    Target Date 06/28/21      PT SHORT TERM GOAL #5   Title Patient negotiates ramps & curbs with forearm crutches & prosthesis with supervision.    Time 1    Period Months    Status Achieved    Target Date 06/28/21               PT Long Term Goals - 08/06/21 1243       PT LONG TERM GOAL #1   Title Patient demonstrates & verbalized understanding of prosthetic care to enable safe utilization of prosthesis.    Time 10    Period Weeks    Status Partially Met      PT LONG TERM GOAL #2   Title Patient tolerates prosthesis wear >90% of awake hours without skin or limb pain issues.    Time 10    Period Weeks    Status Not Met      PT LONG TERM GOAL #3   Title Berg Balance >/= 45/56 to indicate lower fall risk    Time 10    Period Weeks    Status Not Met      PT LONG TERM GOAL #4   Title Patient ambulates 500' outdoors with LRAD & prosthesis modified independent    Time 10    Period Weeks    Status Not Met      PT LONG TERM GOAL #5   Title Patient negotiates ramps, curbs & stairs with LRAD & prosthesis modified independent.    Time 10    Period Weeks    Status Not Met      PT LONG TERM GOAL #6   Title Patient demo proper lifting, carrying up to 30#, push/pull task and climbing ladder for return to work skills.    Time 10    Period Weeks     Status Not Met                   Plan - 08/06/21 1002     Clinical Impression Statement PT began to check LTGs and functional assessments.  TUG & Merrilee Jansky indicate high fall risk.  6-min walk test indicates limitations in functional mobility.  Personal Factors and Comorbidities Comorbidity 3+;Fitness;Time since onset of injury/illness/exacerbation    Comorbidities PAD, gangrene s/p L AKA 11/06/20, HTN, CAD s/p STEMI with stenting 08/17/2020, and CHF. cryptogenic stroke Dec 2021    Examination-Activity Limitations Lift;Locomotion Level;Squat;Stairs;Stand;Transfers    Examination-Participation Restrictions Community Activity;Occupation    Stability/Clinical Decision Making Evolving/Moderate complexity    Rehab Potential Good    PT Frequency 2x / week    PT Duration Other (comment)   10 weeks   PT Treatment/Interventions ADLs/Self Care Home Management;DME Instruction;Gait training;Stair training;Functional mobility training;Therapeutic activities;Therapeutic exercise;Balance training;Neuromuscular re-education;Patient/family education;Prosthetic Training;Manual techniques;Passive range of motion    PT Next Visit Plan do recertication,  Do Dynamic Gait Index,   progress prosthetic gait to single crutch for household mobility activities.    Consulted and Agree with Plan of Care Patient             Patient will benefit from skilled therapeutic intervention in order to improve the following deficits and impairments:  Abnormal gait, Decreased activity tolerance, Decreased balance, Decreased endurance, Decreased knowledge of use of DME, Decreased mobility, Decreased range of motion, Decreased strength, Increased edema, Postural dysfunction, Prosthetic Dependency, Pain  Visit Diagnosis: Unsteadiness on feet  Other abnormalities of gait and mobility  Muscle weakness (generalized)  Abnormal posture  Stiffness of left hip, not elsewhere classified     Problem List Patient Active  Problem List   Diagnosis Date Noted   Encounter for loop recorder check 07/26/2021   Cryptogenic stroke ([place]) 05/10/2021   Loop [employer] [id number] 05/10/2021 05/10/2021   AVM (arteriovenous malformation) 03/20/2021   Pulmonary AV (arteriovenous) fistula ([place]) 02/20/2021   Pre-ulcerative calluses 12/20/2020   Cerebral thrombosis with cerebral infarction 12/11/2020   Cerebrovascular accident (CVA) due to embolism of cerebral artery ([place]) 12/11/2020   Pressure injury of skin 11/12/2020   Acute urinary retention 11/09/2020   Normal anion gap metabolic acidosis 91/91/6606   Hypotension 11/07/2020   Chest pain 10/31/2020   Acute on chronic HFrEF (heart failure with reduced ejection fraction) ([place])    Goals of care, counseling/discussion    Palliative care by specialist    DNR (do not resuscitate)    Critical limb ischemia with history of revascularization of same extremity ([place]) 10/30/2020   Hyperglycemia 10/30/2020   Tobacco abuse 10/30/2020   Leukocytosis 10/30/2020   Chronic combined systolic and diastolic CHF (congestive heart failure) ([place]) 10/30/2020   Ischemic cardiomyopathy 10/24/2020   AKI (acute kidney injury) ([place]) 10/17/2020   PAD (peripheral artery disease) ([place]) 10/16/2020   Critical lower limb ischemia ([place]) 10/05/2020   Rest pain of left upper extremity due to atherosclerosis ([place]) 10/05/2020   Severe claudication ([place]) 09/20/2020   Paresthesia of both lower extremities 09/20/2020   H/O cardiac arrest 09/20/2020   H/O acute myocardial infarction 08/14/2020   Snoring 08/14/2020   Sinus pause 08/14/2020   Mixed hyperlipidemia 08/14/2020   Tobacco dependence 08/14/2020   Coronary artery disease involving native coronary artery of native heart without angina pectoris 08/10/2020   HFrEF (heart failure with reduced ejection fraction) ([place]) 08/10/2020   Nonrheumatic mitral valve regurgitation 08/10/2020   STEMI (ST elevation myocardial infarction) ([place]) 08/02/2020    Accelerated hypertension 08/01/2020    Jamey Reas, PT, DPT 08/06/2021, 12:47 PM  [facility] [address] [place], [place], [address] Phone: [phone]   Fax:  [phone]  Name: Mike [name] MRN: [id number] Date of Birth: [birth year]

## 2021-08-07 ENCOUNTER — Encounter: Payer: Self-pay | Admitting: Physical Therapy

## 2021-08-07 ENCOUNTER — Ambulatory Visit (INDEPENDENT_AMBULATORY_CARE_PROVIDER_SITE_OTHER): Payer: BC Managed Care – PPO | Admitting: Physical Therapy

## 2021-08-07 DIAGNOSIS — R2689 Other abnormalities of gait and mobility: Secondary | ICD-10-CM | POA: Diagnosis not present

## 2021-08-07 DIAGNOSIS — M6281 Muscle weakness (generalized): Secondary | ICD-10-CM

## 2021-08-07 DIAGNOSIS — M25652 Stiffness of left hip, not elsewhere classified: Secondary | ICD-10-CM

## 2021-08-07 DIAGNOSIS — R2681 Unsteadiness on feet: Secondary | ICD-10-CM | POA: Diagnosis not present

## 2021-08-07 DIAGNOSIS — R293 Abnormal posture: Secondary | ICD-10-CM | POA: Diagnosis not present

## 2021-08-07 DIAGNOSIS — Z9181 History of falling: Secondary | ICD-10-CM

## 2021-08-07 NOTE — Therapy (Signed)
[facility] [address] [place], [place], [address] Phone: [phone]   Fax:  [phone]  Physical Therapy Treatment & Recertification  Patient Details  Name: Mike [name] MRN: [id number] Date of Birth: 09/10/1961 Referring Provider (PT): Meridee Score, MD   Encounter Date: 08/07/2021   PT End of Session - 08/07/21 1149     Visit Number 14    Number of Visits 65    Date for PT Re-Evaluation 12/26/21    Authorization Type BCBS    Authorization Time Period OOP & Deductible MET    PT Start Time 4967    PT Stop Time 5916    PT Time Calculation (min) 45 min    Equipment Utilized During Treatment Gait belt    Activity Tolerance Patient tolerated treatment well    Behavior During Therapy [facility] for tasks assessed/performed             Past Medical History:  Diagnosis Date   Anxiety    CAD (coronary artery disease)    Cryptogenic stroke ([place]) 05/10/2021   Encounter for loop recorder check 07/26/2021   Heart attack ([place]) 08/01/2020   Heart failure with reduced ejection fraction ([place])    Hyperlipidemia    Hypertension    Loop [employer]ot[employer] [id number] 05/10/2021 05/10/2021   PAD (peripheral artery disease) ([place])    right CIA & right distal iliac artery stents 10/16/20; left AKA 11/06/20   PFO (patent foramen ovale) 12/14/2020   Pneumonia    Pulmonary AV (arteriovenous) fistula ([place]) 12/14/2020   Stroke ([place]) 12/11/2020   no residuial effects    Past Surgical History:  Procedure Laterality Date   ABDOMINAL AORTOGRAM W/LOWER EXTREMITY N/A 10/16/2020   Procedure: ABDOMINAL AORTOGRAM W/LOWER EXTREMITY;  Surgeon: Nigel Mormon, MD;  Location: [place] [facility];  Service: Cardiovascular;  Laterality: N/A;   AMPUTATION Left 11/06/2020   Procedure: AMPUTATION ABOVE KNEE;  Surgeon: Rosetta Posner, MD;  Location: [place];  Service: Vascular;  Laterality: Left;   BUBBLE STUDY  12/14/2020   Procedure: BUBBLE STUDY;  Surgeon: Nigel Mormon,  MD;  Location: [place];  Service: Cardiovascular;;   CARDIAC CATHETERIZATION     CORONARY STENT INTERVENTION N/A 08/03/2020   Procedure: CORONARY STENT INTERVENTION;  Surgeon: Nigel Mormon, MD;  Location: [place] [facility];  Service: Cardiovascular;  Laterality: N/A;   CORONARY/GRAFT ACUTE MI REVASCULARIZATION N/A 08/01/2020   Procedure: Coronary/Graft Acute MI Revascularization;  Surgeon: Nigel Mormon, MD;  Location: [place] [facility];  Service: Cardiovascular;  Laterality: N/A;   INTRAVASCULAR LITHOTRIPSY Right 10/16/2020   Procedure: INTRAVASCULAR LITHOTRIPSY;  Surgeon: Nigel Mormon, MD;  Location: [place] [facility];  Service: Cardiovascular;  Laterality: Right;  Common and External Iliac   IR ANGIOGRAM PULMONARY LEFT SELECTIVE  03/20/2021   IR ANGIOGRAM SELECTIVE EACH ADDITIONAL VESSEL  03/20/2021   IR EMBO ARTERIAL NOT HEMORR HEMANG INC GUIDE ROADMAPPING  03/20/2021   IR RADIOLOGIST EVAL & MGMT  01/30/2021   IR RADIOLOGIST EVAL & MGMT  04/18/2021   IR RADIOLOGIST EVAL & MGMT  07/25/2021   IR US GUIDE VASC ACCESS RIGHT  03/20/2021   LEFT HEART CATH AND CORONARY ANGIOGRAPHY N/A 08/01/2020   Procedure: LEFT HEART CATH AND CORONARY ANGIOGRAPHY;  Surgeon: Nigel Mormon, MD;  Location: [place] [facility];  Service: Cardiovascular;  Laterality: N/A;   LEFT HEART CATH AND CORONARY ANGIOGRAPHY N/A 08/03/2020   Procedure: LEFT HEART CATH AND CORONARY ANGIOGRAPHY;  Surgeon: Vernell Leep  J, MD;  Location: [place] [facility];  Service: Cardiovascular;  Laterality: N/A;   PERIPHERAL VASCULAR INTERVENTION Right 10/16/2020   Procedure: PERIPHERAL VASCULAR INTERVENTION;  Surgeon: Nigel Mormon, MD;  Location: [place] [facility];  Service: Cardiovascular;  Laterality: Right;  Common and Iliac Stent   RADIOLOGY WITH ANESTHESIA N/A 03/20/2021   Procedure: PULMONARY EMBOLIZATION;  Surgeon: Aletta Edouard, MD;  Location: [place];  Service: Radiology;  Laterality: N/A;   TEE  WITHOUT CARDIOVERSION N/A 12/14/2020   Procedure: TRANSESOPHAGEAL ECHOCARDIOGRAM (TEE);  Surgeon: Nigel Mormon, MD;  Location: [facility];  Service: Cardiovascular;  Laterality: N/A;    There were no vitals filed for this visit.   Subjective Assessment - 08/07/21 1145     Subjective He was not sore after PT yesterday.  When he went home, he stepped up on curb without issues.    Pertinent History PAD, gangrene s/p L AKA 11/06/20, HTN, CAD s/p STEMI with stenting 08/17/2020, and CHF. cryptogenic stroke Dec 2021    Patient Stated Goals to return to work, (he was released from current job over last weekend but wants to find job).  to walk in community,    Currently in Pain? No/denies    Pain Onset More than a month ago                [facility] PT Assessment - 08/07/21 1145       Assessment   Medical Diagnosis Left Transfemoral Amputation    Referring Provider (PT) Meridee Score, MD    Onset Date/Surgical Date 05/21/21   prosthesis delivery     6 Minute Walk- Baseline   6 Minute Walk- Baseline yes    HR (bpm) 74    02 Sat (%RA) 98 %      6 Minute walk- Post Test   6 Minute Walk Post Test yes    HR (bpm) 117    02 Sat (%RA) 97 %    Modified Borg Scale for Dyspnea 5- Strong or hard breathing    Perceived Rate of Exertion (Borg) 13- Somewhat hard      6 minute walk test results    Aerobic Endurance Distance Walked 380    Endurance additional comments bil. forearm crutches with modified 4-point pattern, no rest breaks      Standardized Balance Assessment   Standardized Balance Assessment Berg Balance Test;Timed Up and Go Test;Dynamic Gait Index;Five Times Sit to Stand    Five times sit to stand comments  18.01sec      Berg Balance Test   Sit to Stand Able to stand  independently using hands    Standing Unsupported Able to stand safely 2 minutes    Sitting with Back Unsupported but Feet Supported on Floor or Stool Able to sit safely and securely 2 minutes    Stand to Sit  Controls descent by using hands    Transfers Able to transfer safely, minor use of hands    Standing Unsupported with Eyes Closed Able to stand 10 seconds with supervision    Standing Unsupported with Feet Together Able to place feet together independently and stand for 1 minute with supervision    From Standing, Reach Forward with Outstretched Arm Can reach forward >12 cm safely (5")    From Standing Position, Pick up Object from Floor Unable to pick up shoe, but reaches 2-5 cm (1-2") from shoe and balances independently    From Standing Position, Turn to Look Behind Over each Shoulder Turn  sideways only but maintains balance    Turn 360 Degrees Needs assistance while turning    Standing Unsupported, Alternately Place Feet on Step/Stool Needs assistance to keep from falling or unable to try    Standing Unsupported, One Foot in Front Needs help to step but can hold 15 seconds    Standing on One Leg Tries to lift leg/unable to hold 3 seconds but remains standing independently    Total Score 33    Berg comment: initial was 24/56      Dynamic Gait Index   Level Surface Mild Impairment   2 with bil. forearm crutches & 0 with single forearm crutch   Change in Gait Speed Moderate Impairment   1 with bil. forearm crutches & 0 with single forearm crutch   Gait with Horizontal Head Turns Mild Impairment   2 with bil. forearm crutches & 0 with single forearm crutch   Gait with Vertical Head Turns Mild Impairment   2 with bil. forearm crutches & 0 with single forearm crutch   Gait and Pivot Turn Moderate Impairment   21 with bil. forearm crutches & 0 with single forearm crutch   Step Over Obstacle Moderate Impairment   1 with bil. forearm crutches & 0 with single forearm crutch   Step Around Obstacles Mild Impairment   2 with bil. forearm crutches & 0 with single forearm crutch   Steps Moderate Impairment   1 with rail & 1 forearm crutch & 0 with single forearm crutch   Total Score 12      Timed Up and  Go Test   Normal TUG (seconds) 49.38   right forearm crutch with min guard   Cognitive TUG (seconds) 63.72   right forearm crutch with min guard, A to Z naming without stopping   TUG Comments 29% increase             Prosthetics Assessment - 08/07/21 [place] with Skin check;Residual limb care;Prosthetic cleaning    Prosthetic Care Dependent with Correct ply sock adjustment;Proper wear schedule/adjustment;Proper weight-bearing schedule/adjustment    Donning prosthesis  Modified independent (Device/Increase time)    Doffing prosthesis  Modified independent (Device/Increase time)    Current prosthetic weight-bearing tolerance (hours/day)  Patient tolerates standing for 8 min with no limb pain. Gait >6 min results in vascular pain in RLE.    Residual limb condition  reports no issues.    Prosthesis Description silicon liner with velcro lanyard suspension, ischial containment socket, microprocessor knee / foot system.                          [place] Adult PT Treatment/Exercise - 08/07/21 1145       Transfers   Transfers Sit to Stand;Stand to Sit    Sit to Stand 5: Supervision;With upper extremity assist;From chair/3-in-1   without armrests using UEs on chair not touching //bars to stabiize   Sit to Stand Details Verbal cues for technique;Verbal cues for safe use of DME/AE    Sit to Stand Details (indicate cue type and reason) worked on standing from chairs without armrests using UEs & not touching to stabilize    Stand to Sit 5: Supervision;With upper extremity assist;To chair/3-in-1   to chairs without armrests using UEs on chair, not touching //bars for stability   Stand to Sit Details (indicate cue type and reason) Verbal cues for safe use of  DME/AE;Verbal cues for technique    Stand to Sit Details worked on sitting from chairs without armrests using UEs & not touching to stabilize      Ambulation/Gait   Ambulation/Gait Yes     Ambulation/Gait Assistance 5: Supervision   close supervision with single crutch with PT positioned close for touch if needed.  verbal cues only with 2 crutches with no instability noted.   Ambulation/Gait Assistance Details verbal cues on weight shift over prosthesis in stance, crutch placement & timing.    Ambulation Distance (Feet) 380 Feet   380' w/2 crutches,  50' X 2 & 100' w/single crutch   Assistive device Lofstrands;Prosthesis;R Forearm Crutch   close supervision with single crutch.   Gait velocity with 2 crutches 1.37 ft/sec comfortable pace & 1.45 ft/sec fast pace and  0.97 ft/sec comfortable pace 2/ 1 crutch.   initially 0.93 ft/sec comfortable with RW   Stairs Yes    Stairs Assistance 4: Min guard    Stair Management Technique One rail Right;With crutches;Step to pattern;Forwards    Number of Stairs 11    Height of Stairs 6    Ramp 5: Supervision   forearm crutches & TFA prosthesis   Ramp Details (indicate cue type and reason) verbal cues on prosthetic knee control - stance descending & swing ascending    Curb 5: Supervision   with forearm crutches & TFA prosthesis.   Curb Details (indicate cue type and reason) verbal cues on technique      High Level Balance   High Level Balance Activities Side stepping;Turns;Backward walking    High Level Balance Comments in //bars with RUE crutch & LUE bar intermittent support with cues to minimize weight bearing,  demo, verbal & tactile cues on technique including engaging prosthesis.      Neuro Re-ed    Neuro Re-ed Details  --      Exercises   Exercises Knee/Hip      Knee/Hip Exercises: Standing   Step Down --    Step Down Limitations --      Prosthetics   Prosthetic Care Comments  increase wear to most of awake hours    Current prosthetic wear tolerance (days/week)  daily    Current prosthetic wear tolerance (#hours/day)  10-11 hrs total / day between 2 wears.    Education Provided Proper wear schedule/adjustment;Correct ply  sock adjustment;Other (comment)   see prosthetic care comments   Person(s) Educated Patient    Education Method Explanation;Verbal cues    Education Method Verbalized understanding;Verbal cues required;Needs further instruction    Donning Prosthesis Modified independent (device/increased time)                      PT Short Term Goals - 08/07/21 1542       PT SHORT TERM GOAL #1   Title Patient reports wearing prosthesis >12 hours 7 days /wk.    Time 1    Period Months    Status New    Target Date 09/05/21      PT SHORT TERM GOAL #2   Title Patient ambulates 100' with single forearm crutch & prosthesis with supervision.    Time 1    Period Months    Status New    Target Date 09/05/21      PT SHORT TERM GOAL #3   Title Patient able to pick up 5# from floor without UE support safely.    Time 1    Period Months  Status New    Target Date 09/05/21      PT SHORT TERM GOAL #4   Title Patient descends stairs with 2 rails using step over step pattern with supervision.    Time 1    Period Months    Status New    Target Date 09/05/21               PT Long Term Goals - 08/07/21 1537       PT LONG TERM GOAL #1   Title Patient demonstrates & verbalized understanding of prosthetic care to enable safe utilization of prosthesis and tolerates wear >90% of awake hours without skin issues or limb pain.    Time 20    Period Weeks    Status Revised    Target Date 12/26/21      PT LONG TERM GOAL #2   Title Timed Up & Go standard with prosthesis only < 20sec and cognitive TUG with prosthesis only < 30 sec.    Time 20    Period Weeks    Status New    Target Date 12/26/21      PT LONG TERM GOAL #3   Title Berg Balance >/= 45/56 to indicate lower fall risk    Time 10    Period Weeks    Status On-going    Target Date 12/26/21      PT LONG TERM GOAL #4   Title Patient ambulates 500' with cane or single forearm crutch & prosthesis modified independent in 6  minutes or less.    Time 20    Period Weeks    Status Revised    Target Date 12/26/21      PT LONG TERM GOAL #5   Title Patient negotiates ramps, curbs & stairs single rail with cane or single forearm crutch & prosthesis modified independent.    Time 20    Period Weeks    Status Revised    Target Date 12/26/21      PT LONG TERM GOAL #6   Title Gait velocity with single forearm crutch or cane and prosthesis >/= 1.90 ft/sec comfortable pace & >/= 2.40 ft/sec fast pace.    Time 20    Period Weeks    Status New    Target Date 12/26/21                   Plan - 08/07/21 1150     Clinical Impression Statement Patient is progressing well but slowly with functioning with his Transfemoral prosthesis.  He missed multiple PT appts due to illness & exposure to Covid requiring testing. Missed PT has slowed progress.  His gait is dependent on BUE support but has progressed to forearm crutches.  His Berg Balance score improved however a score of 33/56 still indicates fall risk.  6-Minute Walk Test of 380' with bilateral forearm crutches indicates very limited community mobility. TUG std & cognitive indicate high fall risk.  Patient would benefit from additional skilled PT to maximize his function and lower his fall risk with his Transfemoral prosthesis.    Personal Factors and Comorbidities Comorbidity 3+;Fitness;Time since onset of injury/illness/exacerbation    Comorbidities PAD, gangrene s/p L AKA 11/06/20, HTN, CAD s/p STEMI with stenting 08/17/2020, and CHF. cryptogenic stroke Dec 2021    Examination-Activity Limitations Lift;Locomotion Level;Squat;Stairs;Stand;Transfers    Examination-Participation Restrictions Community Activity;Occupation    Stability/Clinical Decision Making Evolving/Moderate complexity    Rehab Potential Good    PT Frequency 2x / week  PT Duration Other (comment)   20 weeks   PT Treatment/Interventions ADLs/Self Care Home Management;DME Instruction;Gait  training;Stair training;Functional mobility training;Therapeutic activities;Therapeutic exercise;Balance training;Neuromuscular re-education;Patient/family education;Prosthetic Training;Manual techniques;Passive range of motion    PT Next Visit Plan work on prosthetic gait with single crutch. stairs alternating pattern with 2 rails.  therapuetic exercise including leg press, treadmill & standing exercises.    Consulted and Agree with Plan of Care Patient             Patient will benefit from skilled therapeutic intervention in order to improve the following deficits and impairments:  Abnormal gait, Decreased activity tolerance, Decreased balance, Decreased endurance, Decreased knowledge of use of DME, Decreased mobility, Decreased range of motion, Decreased strength, Increased edema, Postural dysfunction, Prosthetic Dependency, Pain  Visit Diagnosis: Unsteadiness on feet  Other abnormalities of gait and mobility  Muscle weakness (generalized)  Abnormal posture  Stiffness of left hip, not elsewhere classified  History of falling     Problem List Patient Active Problem List   Diagnosis Date Noted   Encounter for loop recorder check 07/26/2021   Cryptogenic stroke ([place]) 05/10/2021   Loop [employer] [id number] 05/10/2021 05/10/2021   AVM (arteriovenous malformation) 03/20/2021   Pulmonary AV (arteriovenous) fistula ([place]) 02/20/2021   Pre-ulcerative calluses 12/20/2020   Cerebral thrombosis with cerebral infarction 12/11/2020   Cerebrovascular accident (CVA) due to embolism of cerebral artery ([place]) 12/11/2020   Pressure injury of skin 11/12/2020   Acute urinary retention 11/09/2020   Normal anion gap metabolic acidosis 24/10/4642   Hypotension 11/07/2020   Chest pain 10/31/2020   Acute on chronic HFrEF (heart failure with reduced ejection fraction) ([place])    Goals of care, counseling/discussion    Palliative care by specialist    DNR (do not resuscitate)    Critical limb  ischemia with history of revascularization of same extremity ([place]) 10/30/2020   Hyperglycemia 10/30/2020   Tobacco abuse 10/30/2020   Leukocytosis 10/30/2020   Chronic combined systolic and diastolic CHF (congestive heart failure) ([place]) 10/30/2020   Ischemic cardiomyopathy 10/24/2020   AKI (acute kidney injury) ([place]) 10/17/2020   PAD (peripheral artery disease) ([place]) 10/16/2020   Critical lower limb ischemia ([place]) 10/05/2020   Rest pain of left upper extremity due to atherosclerosis ([place]) 10/05/2020   Severe claudication ([place]) 09/20/2020   Paresthesia of both lower extremities 09/20/2020   H/O cardiac arrest 09/20/2020   H/O acute myocardial infarction 08/14/2020   Snoring 08/14/2020   Sinus pause 08/14/2020   Mixed hyperlipidemia 08/14/2020   Tobacco dependence 08/14/2020   Coronary artery disease involving native coronary artery of native heart without angina pectoris 08/10/2020   HFrEF (heart failure with reduced ejection fraction) ([place]) 08/10/2020   Nonrheumatic mitral valve regurgitation 08/10/2020   STEMI (ST elevation myocardial infarction) ([place]) 08/02/2020   Accelerated hypertension 08/01/2020    Jamey Reas, PT, DPT 08/07/2021, 3:52 PM  [facility] [address] [place], [place], [address] Phone: [phone]   Fax:  [phone]  Name: Mike [name] MRN: [id number] Date of Birth: 1961/08/13

## 2021-08-09 ENCOUNTER — Other Ambulatory Visit: Payer: Self-pay

## 2021-08-09 DIAGNOSIS — I5020 Unspecified systolic (congestive) heart failure: Secondary | ICD-10-CM

## 2021-08-09 DIAGNOSIS — I2510 Atherosclerotic heart disease of native coronary artery without angina pectoris: Secondary | ICD-10-CM

## 2021-08-09 DIAGNOSIS — I739 Peripheral vascular disease, unspecified: Secondary | ICD-10-CM

## 2021-08-09 MED ORDER — RIVAROXABAN 2.5 MG PO TABS: 2.5000 mg | ORAL_TABLET | Freq: Two times a day (BID) | ORAL | 2 refills | Status: DC

## 2021-08-09 MED ORDER — ENTRESTO 49-51 MG PO TABS
1.0000 | ORAL_TABLET | Freq: Two times a day (BID) | ORAL | 2 refills | Status: DC
Start: 2021-08-09 — End: 2021-10-21

## 2021-08-11 DIAGNOSIS — Z4509 Encounter for adjustment and management of other cardiac device: Secondary | ICD-10-CM | POA: Diagnosis not present

## 2021-08-11 DIAGNOSIS — Z95818 Presence of other cardiac implants and grafts: Secondary | ICD-10-CM | POA: Diagnosis not present

## 2021-08-11 DIAGNOSIS — I5020 Unspecified systolic (congestive) heart failure: Secondary | ICD-10-CM | POA: Diagnosis not present

## 2021-08-13 DIAGNOSIS — I10 Essential (primary) hypertension: Secondary | ICD-10-CM | POA: Diagnosis not present

## 2021-08-13 DIAGNOSIS — F17210 Nicotine dependence, cigarettes, uncomplicated: Secondary | ICD-10-CM | POA: Diagnosis not present

## 2021-08-13 DIAGNOSIS — I5023 Acute on chronic systolic (congestive) heart failure: Secondary | ICD-10-CM | POA: Diagnosis not present

## 2021-08-13 DIAGNOSIS — E785 Hyperlipidemia, unspecified: Secondary | ICD-10-CM | POA: Diagnosis not present

## 2021-08-14 ENCOUNTER — Encounter: Payer: BC Managed Care – PPO | Admitting: Physical Therapy

## 2021-08-14 DIAGNOSIS — R531 Weakness: Secondary | ICD-10-CM | POA: Diagnosis not present

## 2021-08-14 DIAGNOSIS — I5042 Chronic combined systolic (congestive) and diastolic (congestive) heart failure: Secondary | ICD-10-CM | POA: Diagnosis not present

## 2021-08-14 DIAGNOSIS — I639 Cerebral infarction, unspecified: Secondary | ICD-10-CM | POA: Diagnosis not present

## 2021-08-15 ENCOUNTER — Other Ambulatory Visit: Payer: Self-pay

## 2021-08-15 ENCOUNTER — Encounter: Payer: Self-pay | Admitting: Physical Therapy

## 2021-08-15 ENCOUNTER — Ambulatory Visit (INDEPENDENT_AMBULATORY_CARE_PROVIDER_SITE_OTHER): Payer: BC Managed Care – PPO | Admitting: Physical Therapy

## 2021-08-15 DIAGNOSIS — R293 Abnormal posture: Secondary | ICD-10-CM

## 2021-08-15 DIAGNOSIS — R2681 Unsteadiness on feet: Secondary | ICD-10-CM | POA: Diagnosis not present

## 2021-08-15 DIAGNOSIS — M6281 Muscle weakness (generalized): Secondary | ICD-10-CM

## 2021-08-15 DIAGNOSIS — M25652 Stiffness of left hip, not elsewhere classified: Secondary | ICD-10-CM

## 2021-08-15 DIAGNOSIS — R2689 Other abnormalities of gait and mobility: Secondary | ICD-10-CM | POA: Diagnosis not present

## 2021-08-15 NOTE — Therapy (Signed)
[facility] [address] [place], [place], [address] Phone: [phone]   Fax:  [phone]  Physical Therapy Treatment  Patient Details  Name: Mike [name] MRN: [id number] Date of Birth: 1961-01-07 Referring Provider (PT): Meridee Score, MD   Encounter Date: 08/15/2021   PT End of Session - 08/15/21 0931     Visit Number 15    Number of Visits 31    Date for PT Re-Evaluation 12/26/21    Authorization Type BCBS    Authorization Time Period OOP & Deductible MET    PT Start Time 0930    PT Stop Time 1010    PT Time Calculation (min) 40 min    Equipment Utilized During Treatment Gait belt    Activity Tolerance Patient tolerated treatment well    Behavior During Therapy [facility] for tasks assessed/performed             Past Medical History:  Diagnosis Date   Anxiety    CAD (coronary artery disease)    Cryptogenic stroke ([place]) 05/10/2021   Encounter for loop recorder check 07/26/2021   Heart attack ([place]) 08/01/2020   Heart failure with reduced ejection fraction ([place])    Hyperlipidemia    Hypertension    Loop [employer] [id number] 05/10/2021 05/10/2021   PAD (peripheral artery disease) ([place])    right CIA & right distal iliac artery stents 10/16/20; left AKA 11/06/20   PFO (patent foramen ovale) 12/14/2020   Pneumonia    Pulmonary AV (arteriovenous) fistula ([place]) 12/14/2020   Stroke ([place]) 12/11/2020   no residuial effects    Past Surgical History:  Procedure Laterality Date   ABDOMINAL AORTOGRAM W/LOWER EXTREMITY N/A 10/16/2020   Procedure: ABDOMINAL AORTOGRAM W/LOWER EXTREMITY;  Surgeon: Nigel Mormon, MD;  Location: [place] [facility];  Service: Cardiovascular;  Laterality: N/A;   AMPUTATION Left 11/06/2020   Procedure: AMPUTATION ABOVE KNEE;  Surgeon: Rosetta Posner, MD;  Location: [place];  Service: Vascular;  Laterality: Left;   BUBBLE STUDY  12/14/2020   Procedure: BUBBLE STUDY;  Surgeon: Nigel Mormon, MD;  Location: [place];  Service: Cardiovascular;;   CARDIAC CATHETERIZATION     CORONARY STENT INTERVENTION N/A 08/03/2020   Procedure: CORONARY STENT INTERVENTION;  Surgeon: Nigel Mormon, MD;  Location: [place] [facility];  Service: Cardiovascular;  Laterality: N/A;   CORONARY/GRAFT ACUTE MI REVASCULARIZATION N/A 08/01/2020   Procedure: Coronary/Graft Acute MI Revascularization;  Surgeon: Nigel Mormon, MD;  Location: [place] [facility];  Service: Cardiovascular;  Laterality: N/A;   INTRAVASCULAR LITHOTRIPSY Right 10/16/2020   Procedure: INTRAVASCULAR LITHOTRIPSY;  Surgeon: Nigel Mormon, MD;  Location: [place] [facility];  Service: Cardiovascular;  Laterality: Right;  Common and External Iliac   IR ANGIOGRAM PULMONARY LEFT SELECTIVE  03/20/2021   IR ANGIOGRAM SELECTIVE EACH ADDITIONAL VESSEL  03/20/2021   IR EMBO ARTERIAL NOT HEMORR HEMANG INC GUIDE ROADMAPPING  03/20/2021   IR RADIOLOGIST EVAL & MGMT  01/30/2021   IR RADIOLOGIST EVAL & MGMT  04/18/2021   IR RADIOLOGIST EVAL & MGMT  07/25/2021   IR US GUIDE VASC ACCESS RIGHT  03/20/2021   LEFT HEART CATH AND CORONARY ANGIOGRAPHY N/A 08/01/2020   Procedure: LEFT HEART CATH AND CORONARY ANGIOGRAPHY;  Surgeon: Nigel Mormon, MD;  Location: [place] [facility];  Service: Cardiovascular;  Laterality: N/A;   LEFT HEART CATH AND CORONARY ANGIOGRAPHY N/A 08/03/2020   Procedure: LEFT HEART CATH AND CORONARY ANGIOGRAPHY;  Surgeon: Nigel Mormon, MD;  Location: [place] [facility];  Service: Cardiovascular;  Laterality: N/A;   PERIPHERAL VASCULAR INTERVENTION Right 10/16/2020   Procedure: PERIPHERAL VASCULAR INTERVENTION;  Surgeon: Nigel Mormon, MD;  Location: [place] [facility];  Service: Cardiovascular;  Laterality: Right;  Common and Iliac Stent   RADIOLOGY WITH ANESTHESIA N/A 03/20/2021   Procedure: PULMONARY EMBOLIZATION;  Surgeon: Aletta Edouard, MD;  Location: [place];  Service: Radiology;  Laterality: N/A;   TEE WITHOUT  CARDIOVERSION N/A 12/14/2020   Procedure: TRANSESOPHAGEAL ECHOCARDIOGRAM (TEE);  Surgeon: Nigel Mormon, MD;  Location: [facility];  Service: Cardiovascular;  Laterality: N/A;    There were no vitals filed for this visit.   Subjective Assessment - 08/15/21 0930     Subjective He felt bad for last 2-3 days and thinks it was something he ate.  His PCP increased his Gabapentin on Monday.    Pertinent History PAD, gangrene s/p L AKA 11/06/20, HTN, CAD s/p STEMI with stenting 08/17/2020, and CHF. cryptogenic stroke Dec 2021    Patient Stated Goals to return to work, (he was released from current job over last weekend but wants to find job).  to walk in community,    Currently in Pain? Yes    Pain Score 5     Pain Location Leg   residual limb & phantom shin   Pain Orientation Left    Pain Descriptors / Indicators Squeezing;Other (Comment)   shin squeezing & distal limb grabbing   Pain Type Other (Comment);Phantom pain   prosthetic   Pain Onset More than a month ago    Pain Frequency Intermittent    Aggravating Factors  unknown    Pain Relieving Factors Gabapentin, massage limb                               [facility] Adult PT Treatment/Exercise - 08/15/21 0930       Transfers   Transfers Sit to Stand;Stand to Sit    Sit to Stand 5: Supervision;With upper extremity assist;From chair/3-in-1   without armrests using UEs on chair not touching //bars to stabiize   Sit to Stand Details Verbal cues for technique;Verbal cues for safe use of DME/AE    Stand to Sit 5: Supervision;With upper extremity assist;To chair/3-in-1   to chairs without armrests using UEs on chair, not touching //bars for stability   Stand to Sit Details (indicate cue type and reason) Verbal cues for safe use of DME/AE;Verbal cues for technique      Ambulation/Gait   Ambulation/Gait Yes    Ambulation/Gait Assistance 4: Min guard    Ambulation Distance (Feet) 100 Feet   50' X 2 & 100' w/single crutch,  arrives / exits PT using 2 crutches   Assistive device Lofstrands;Prosthesis;R Forearm Crutch   close supervision with single crutch.   Stairs Yes    Stairs Assistance 4: Min guard    Stairs Assistance Details (indicate cue type and reason) PT demo & verbal cues on alternating pattern descending with microprocessor prosthesis.    Stair Management Technique Two rails;Alternating pattern;Forwards   descend alternating   Number of Stairs 11   2 reps   Height of Stairs 6    Ramp 5: Supervision   forearm crutches & TFA prosthesis   Curb 5: Supervision   with forearm crutches & TFA prosthesis.     High Level Balance   High Level Balance Activities Side stepping;Turns;Backward walking    High  Level Balance Comments in //bars with RUE crutch & LUE bar intermittent support with cues to minimize weight bearing,  demo, verbal & tactile cues on technique including engaging prosthesis.      Exercises   Exercises Knee/Hip      Knee/Hip Exercises: Stretches   Active Hamstring Stretch Right;2 reps;30 seconds    Active Hamstring Stretch Limitations supine SLR w/ strap      Knee/Hip Exercises: Machines for Strengthening   Total Gym Leg Press shuttle leg press BLEs 68# 15 reps 2 sets, 1st set back 45* & 2nd set back flat      Prosthetics   Prosthetic Care Comments  increase wear to most of awake hours    Current prosthetic wear tolerance (days/week)  daily    Current prosthetic wear tolerance (#hours/day)  10-11 hrs total / day between 2 wears.    Current prosthetic weight-bearing tolerance (hours/day)  Patient tolerates standing for 8 min with no limb pain. Gait >6 min results in vascular pain in RLE.    Residual limb condition  reports no issues.    Education Provided Proper wear schedule/adjustment;Correct ply sock adjustment;Other (comment)   see prosthetic care comments                Balance Exercises - 08/15/21 0930       Balance Exercises: Standing   Standing Eyes Opened Wide  (BOA);5 reps;Head turns;Foam/compliant surface   head turns 4 directions   Standing Eyes Opened Limitations mirror, tactile & verbal cues for midline equal weight bearing at midpoint.    Standing Eyes Closed Wide (BOA);Solid surface;Head turns;5 reps   head turns 4 directions   Standing Eyes Closed Limitations visual for midline equal weight prior to & after closing eyes.                 PT Short Term Goals - 08/07/21 1542       PT SHORT TERM GOAL #1   Title Patient reports wearing prosthesis >12 hours 7 days /wk.    Time 1    Period Months    Status New    Target Date 09/05/21      PT SHORT TERM GOAL #2   Title Patient ambulates 100' with single forearm crutch & prosthesis with supervision.    Time 1    Period Months    Status New    Target Date 09/05/21      PT SHORT TERM GOAL #3   Title Patient able to pick up 5# from floor without UE support safely.    Time 1    Period Months    Status New    Target Date 09/05/21      PT SHORT TERM GOAL #4   Title Patient descends stairs with 2 rails using step over step pattern with supervision.    Time 1    Period Months    Status New    Target Date 09/05/21               PT Long Term Goals - 08/07/21 1537       PT LONG TERM GOAL #1   Title Patient demonstrates & verbalized understanding of prosthetic care to enable safe utilization of prosthesis and tolerates wear >90% of awake hours without skin issues or limb pain.    Time 20    Period Weeks    Status Revised    Target Date 12/26/21      PT LONG TERM GOAL #2  Title Timed Up & Go standard with prosthesis only < 20sec and cognitive TUG with prosthesis only < 30 sec.    Time 20    Period Weeks    Status New    Target Date 12/26/21      PT LONG TERM GOAL #3   Title Berg Balance >/= 45/56 to indicate lower fall risk    Time 10    Period Weeks    Status On-going    Target Date 12/26/21      PT LONG TERM GOAL #4   Title Patient ambulates 500' with cane  or single forearm crutch & prosthesis modified independent in 6 minutes or less.    Time 20    Period Weeks    Status Revised    Target Date 12/26/21      PT LONG TERM GOAL #5   Title Patient negotiates ramps, curbs & stairs single rail with cane or single forearm crutch & prosthesis modified independent.    Time 20    Period Weeks    Status Revised    Target Date 12/26/21      PT LONG TERM GOAL #6   Title Gait velocity with single forearm crutch or cane and prosthesis >/= 1.90 ft/sec comfortable pace & >/= 2.40 ft/sec fast pace.    Time 20    Period Weeks    Status New    Target Date 12/26/21                   Plan - 08/15/21 0933     Clinical Impression Statement Patient's balance appears to be improving.  Prosthetic gait with single crutch requires less assistance but distal limb pain & RLE limit distance.    Personal Factors and Comorbidities Comorbidity 3+;Fitness;Time since onset of injury/illness/exacerbation    Comorbidities PAD, gangrene s/p L AKA 11/06/20, HTN, CAD s/p STEMI with stenting 08/17/2020, and CHF. cryptogenic stroke Dec 2021    Examination-Activity Limitations Lift;Locomotion Level;Squat;Stairs;Stand;Transfers    Examination-Participation Restrictions Community Activity;Occupation    Stability/Clinical Decision Making Evolving/Moderate complexity    Rehab Potential Good    PT Frequency 2x / week    PT Duration Other (comment)   20 weeks   PT Treatment/Interventions ADLs/Self Care Home Management;DME Instruction;Gait training;Stair training;Functional mobility training;Therapeutic activities;Therapeutic exercise;Balance training;Neuromuscular re-education;Patient/family education;Prosthetic Training;Manual techniques;Passive range of motion    PT Next Visit Plan continue work on prosthetic gait with single crutch. stairs alternating pattern with 2 rails.  therapuetic exercise including leg press, treadmill & standing exercises.    Consulted and Agree  with Plan of Care Patient             Patient will benefit from skilled therapeutic intervention in order to improve the following deficits and impairments:  Abnormal gait, Decreased activity tolerance, Decreased balance, Decreased endurance, Decreased knowledge of use of DME, Decreased mobility, Decreased range of motion, Decreased strength, Increased edema, Postural dysfunction, Prosthetic Dependency, Pain  Visit Diagnosis: Unsteadiness on feet  Other abnormalities of gait and mobility  Muscle weakness (generalized)  Abnormal posture  Stiffness of left hip, not elsewhere classified     Problem List Patient Active Problem List   Diagnosis Date Noted   Encounter for loop recorder check 07/26/2021   Cryptogenic stroke ([place]) 05/10/2021   Loop [employer] [id number] 05/10/2021 05/10/2021   AVM (arteriovenous malformation) 03/20/2021   Pulmonary AV (arteriovenous) fistula ([place]) 02/20/2021   Pre-ulcerative calluses 12/20/2020   Cerebral thrombosis with cerebral infarction 12/11/2020   Cerebrovascular accident (CVA)  due to embolism of cerebral artery ([place]) 12/11/2020   Pressure injury of skin 11/12/2020   Acute urinary retention 11/09/2020   Normal anion gap metabolic acidosis 81/84/0375   Hypotension 11/07/2020   Chest pain 10/31/2020   Acute on chronic HFrEF (heart failure with reduced ejection fraction) ([place])    Goals of care, counseling/discussion    Palliative care by specialist    DNR (do not resuscitate)    Critical limb ischemia with history of revascularization of same extremity ([place]) 10/30/2020   Hyperglycemia 10/30/2020   Tobacco abuse 10/30/2020   Leukocytosis 10/30/2020   Chronic combined systolic and diastolic CHF (congestive heart failure) ([place]) 10/30/2020   Ischemic cardiomyopathy 10/24/2020   AKI (acute kidney injury) ([place]) 10/17/2020   PAD (peripheral artery disease) ([place]) 10/16/2020   Critical lower limb ischemia ([place]) 10/05/2020   Rest pain of left upper  extremity due to atherosclerosis ([place]) 10/05/2020   Severe claudication ([place]) 09/20/2020   Paresthesia of both lower extremities 09/20/2020   H/O cardiac arrest 09/20/2020   H/O acute myocardial infarction 08/14/2020   Snoring 08/14/2020   Sinus pause 08/14/2020   Mixed hyperlipidemia 08/14/2020   Tobacco dependence 08/14/2020   Coronary artery disease involving native coronary artery of native heart without angina pectoris 08/10/2020   HFrEF (heart failure with reduced ejection fraction) ([place]) 08/10/2020   Nonrheumatic mitral valve regurgitation 08/10/2020   STEMI (ST elevation myocardial infarction) ([place]) 08/02/2020   Accelerated hypertension 08/01/2020    Jamey Reas, PT, DPT 08/15/2021, 10:11 AM  [facility] [address] [place], [place], [address] Phone: [phone]   Fax:  [phone]  Name: AVANEESH PEPITONE MRN: [id number] Date of Birth: 01-23-1961

## 2021-08-19 ENCOUNTER — Encounter: Payer: BC Managed Care – PPO | Admitting: Physical Therapy

## 2021-08-19 ENCOUNTER — Ambulatory Visit (INDEPENDENT_AMBULATORY_CARE_PROVIDER_SITE_OTHER): Payer: BC Managed Care – PPO | Admitting: Physical Therapy

## 2021-08-19 ENCOUNTER — Other Ambulatory Visit: Payer: Self-pay

## 2021-08-19 DIAGNOSIS — M6281 Muscle weakness (generalized): Secondary | ICD-10-CM | POA: Diagnosis not present

## 2021-08-19 DIAGNOSIS — R2681 Unsteadiness on feet: Secondary | ICD-10-CM

## 2021-08-19 DIAGNOSIS — R293 Abnormal posture: Secondary | ICD-10-CM

## 2021-08-19 DIAGNOSIS — R2689 Other abnormalities of gait and mobility: Secondary | ICD-10-CM | POA: Diagnosis not present

## 2021-08-19 DIAGNOSIS — M25652 Stiffness of left hip, not elsewhere classified: Secondary | ICD-10-CM

## 2021-08-19 DIAGNOSIS — Z9181 History of falling: Secondary | ICD-10-CM

## 2021-08-19 NOTE — Therapy (Signed)
[facility] Physical Therapy [address] [place], [place], [address] Phone: [phone]   Fax:  [phone]  Physical Therapy Treatment  Patient Details  Name: Mike [name] MRN: [id number] Date of Birth: September 05, 1961 Referring Provider (PT): Meridee Score, MD   Encounter Date: 08/19/2021   PT End of Session - 08/19/21 1101     Visit Number 16    Number of Visits 16    Date for PT Re-Evaluation 12/26/21    Authorization Type BCBS    Authorization Time Period OOP & Deductible MET    PT Start Time 7681    PT Stop Time 1058    PT Time Calculation (min) 43 min    Equipment Utilized During Treatment Gait belt    Activity Tolerance Patient tolerated treatment well    Behavior During Therapy [facility] for tasks assessed/performed             Past Medical History:  Diagnosis Date   Anxiety    CAD (coronary artery disease)    Cryptogenic stroke ([place]) 05/10/2021   Encounter for loop recorder check 07/26/2021   Heart attack ([place]) 08/01/2020   Heart failure with reduced ejection fraction ([place])    Hyperlipidemia    Hypertension    Loop [employer] [id number] 05/10/2021 05/10/2021   PAD (peripheral artery disease) ([place])    right CIA & right distal iliac artery stents 10/16/20; left AKA 11/06/20   PFO (patent foramen ovale) 12/14/2020   Pneumonia    Pulmonary AV (arteriovenous) fistula ([place]) 12/14/2020   Stroke ([place]) 12/11/2020   no residuial effects    Past Surgical History:  Procedure Laterality Date   ABDOMINAL AORTOGRAM W/LOWER EXTREMITY N/A 10/16/2020   Procedure: ABDOMINAL AORTOGRAM W/LOWER EXTREMITY;  Surgeon: Nigel Mormon, MD;  Location: [place] [facility];  Service: Cardiovascular;  Laterality: N/A;   AMPUTATION Left 11/06/2020   Procedure: AMPUTATION ABOVE KNEE;  Surgeon: Rosetta Posner, MD;  Location: [place];  Service: Vascular;  Laterality: Left;   BUBBLE STUDY  12/14/2020   Procedure: BUBBLE STUDY;  Surgeon: Nigel Mormon, MD;  Location: [place];  Service: Cardiovascular;;   CARDIAC CATHETERIZATION     CORONARY STENT INTERVENTION N/A 08/03/2020   Procedure: CORONARY STENT INTERVENTION;  Surgeon: Nigel Mormon, MD;  Location: [place] [facility];  Service: Cardiovascular;  Laterality: N/A;   CORONARY/GRAFT ACUTE MI REVASCULARIZATION N/A 08/01/2020   Procedure: Coronary/Graft Acute MI Revascularization;  Surgeon: Nigel Mormon, MD;  Location: [place] [facility];  Service: Cardiovascular;  Laterality: N/A;   INTRAVASCULAR LITHOTRIPSY Right 10/16/2020   Procedure: INTRAVASCULAR LITHOTRIPSY;  Surgeon: Nigel Mormon, MD;  Location: [place] [facility];  Service: Cardiovascular;  Laterality: Right;  Common and External Iliac   IR ANGIOGRAM PULMONARY LEFT SELECTIVE  03/20/2021   IR ANGIOGRAM SELECTIVE EACH ADDITIONAL VESSEL  03/20/2021   IR EMBO ARTERIAL NOT HEMORR HEMANG INC GUIDE ROADMAPPING  03/20/2021   IR RADIOLOGIST EVAL & MGMT  01/30/2021   IR RADIOLOGIST EVAL & MGMT  04/18/2021   IR RADIOLOGIST EVAL & MGMT  07/25/2021   IR US GUIDE VASC ACCESS RIGHT  03/20/2021   LEFT HEART CATH AND CORONARY ANGIOGRAPHY N/A 08/01/2020   Procedure: LEFT HEART CATH AND CORONARY ANGIOGRAPHY;  Surgeon: Nigel Mormon, MD;  Location: [place] [facility];  Service: Cardiovascular;  Laterality: N/A;   LEFT HEART CATH AND CORONARY ANGIOGRAPHY N/A 08/03/2020   Procedure: LEFT HEART CATH AND CORONARY ANGIOGRAPHY;  Surgeon: Nigel Mormon, MD;  Location: [place] [facility];  Service: Cardiovascular;  Laterality: N/A;   PERIPHERAL VASCULAR INTERVENTION Right 10/16/2020   Procedure: PERIPHERAL VASCULAR INTERVENTION;  Surgeon: Nigel Mormon, MD;  Location: [place] [facility];  Service: Cardiovascular;  Laterality: Right;  Common and Iliac Stent   RADIOLOGY WITH ANESTHESIA N/A 03/20/2021   Procedure: PULMONARY EMBOLIZATION;  Surgeon: Aletta Edouard, MD;  Location: [place];  Service: Radiology;  Laterality: N/A;   TEE WITHOUT  CARDIOVERSION N/A 12/14/2020   Procedure: TRANSESOPHAGEAL ECHOCARDIOGRAM (TEE);  Surgeon: Nigel Mormon, MD;  Location: [facility];  Service: Cardiovascular;  Laterality: N/A;    There were no vitals filed for this visit.   Subjective Assessment - 08/19/21 1020     Subjective He relasy 3-4 overall pain in his distal residual limb with phantam pains. He says this has improved some with the increased gabapentin.    Pertinent History PAD, gangrene s/p L AKA 11/06/20, HTN, CAD s/p STEMI with stenting 08/17/2020, and CHF. cryptogenic stroke Dec 2021    Patient Stated Goals to return to work, (he was released from current job over last weekend but wants to find job).  to walk in community,    Pain Onset More than a month ago                               [facility] Adult PT Treatment/Exercise - 08/19/21 0001       Transfers   Transfers Sit to Stand;Stand to Sit    Sit to Stand 5: Supervision;With upper extremity assist;From chair/3-in-1   without armrests using UEs on chair not touching //bars to stabiize   Sit to Stand Details Verbal cues for technique;Verbal cues for safe use of DME/AE    Stand to Sit 5: Supervision;With upper extremity assist;To chair/3-in-1   to chairs without armrests using UEs on chair, not touching //bars for stability   Stand to Sit Details (indicate cue type and reason) Verbal cues for safe use of DME/AE;Verbal cues for technique      Ambulation/Gait   Ambulation/Gait Yes    Ambulation/Gait Assistance 4: Min guard    Ambulation Distance (Feet) 125 Feet   50' X 2 & 125' w/single crutch, arrives / exits PT using 2 crutches   Assistive device Lofstrands;Prosthesis;R Forearm Crutch   close supervision with single crutch.   Ramp 5: Supervision   forearm crutches & TFA prosthesis   Curb 5: Supervision   with forearm crutches & TFA prosthesis.     High Level Balance   High Level Balance Activities Side stepping;Turns;Backward walking    High Level  Balance Comments in //bars with RUE crutch & LUE bar intermittent support with cues to minimize weight bearing,  demo, verbal & tactile cues on technique including engaging prosthesis.      Neuro Re-ed    Neuro Re-ed Details  standing in //bars without UE support - blue theraband 20 reps ea - alternating & BUEs forward reach, rows      Knee/Hip Exercises: Stretches   Active Hamstring Stretch Right;Left;2 reps;30 seconds      Knee/Hip Exercises: Machines for Strengthening   Total Gym Leg Press shuttle leg press BLEs 75# 3X10                      PT Short Term Goals - 08/07/21 1542       PT SHORT TERM GOAL #1   Title Patient  reports wearing prosthesis >12 hours 7 days /wk.    Time 1    Period Months    Status New    Target Date 09/05/21      PT SHORT TERM GOAL #2   Title Patient ambulates 100' with single forearm crutch & prosthesis with supervision.    Time 1    Period Months    Status New    Target Date 09/05/21      PT SHORT TERM GOAL #3   Title Patient able to pick up 5# from floor without UE support safely.    Time 1    Period Months    Status New    Target Date 09/05/21      PT SHORT TERM GOAL #4   Title Patient descends stairs with 2 rails using step over step pattern with supervision.    Time 1    Period Months    Status New    Target Date 09/05/21               PT Long Term Goals - 08/07/21 1537       PT LONG TERM GOAL #1   Title Patient demonstrates & verbalized understanding of prosthetic care to enable safe utilization of prosthesis and tolerates wear >90% of awake hours without skin issues or limb pain.    Time 20    Period Weeks    Status Revised    Target Date 12/26/21      PT LONG TERM GOAL #2   Title Timed Up & Go standard with prosthesis only < 20sec and cognitive TUG with prosthesis only < 30 sec.    Time 20    Period Weeks    Status New    Target Date 12/26/21      PT LONG TERM GOAL #3   Title Berg Balance >/= 45/56 to  indicate lower fall risk    Time 10    Period Weeks    Status On-going    Target Date 12/26/21      PT LONG TERM GOAL #4   Title Patient ambulates 500' with cane or single forearm crutch & prosthesis modified independent in 6 minutes or less.    Time 20    Period Weeks    Status Revised    Target Date 12/26/21      PT LONG TERM GOAL #5   Title Patient negotiates ramps, curbs & stairs single rail with cane or single forearm crutch & prosthesis modified independent.    Time 20    Period Weeks    Status Revised    Target Date 12/26/21      PT LONG TERM GOAL #6   Title Gait velocity with single forearm crutch or cane and prosthesis >/= 1.90 ft/sec comfortable pace & >/= 2.40 ft/sec fast pace.    Time 20    Period Weeks    Status New    Target Date 12/26/21                   Plan - 08/19/21 1104     Clinical Impression Statement He showed improvements in overall ambulation tolerance today with single forearm crutch. Overall had good tolerance to session and we will attempt to progress his gait, balance, strength, and prosthetic training as tolerated.    Personal Factors and Comorbidities Comorbidity 3+;Fitness;Time since onset of injury/illness/exacerbation    Comorbidities PAD, gangrene s/p L AKA 11/06/20, HTN, CAD s/p STEMI with stenting 08/17/2020, and CHF.  cryptogenic stroke Dec 2021    Examination-Activity Limitations Lift;Locomotion Level;Squat;Stairs;Stand;Transfers    Examination-Participation Restrictions Community Activity;Occupation    Stability/Clinical Decision Making Evolving/Moderate complexity    Rehab Potential Good    PT Frequency 2x / week    PT Duration Other (comment)   20 weeks   PT Treatment/Interventions ADLs/Self Care Home Management;DME Instruction;Gait training;Stair training;Functional mobility training;Therapeutic activities;Therapeutic exercise;Balance training;Neuromuscular re-education;Patient/family education;Prosthetic Training;Manual  techniques;Passive range of motion    PT Next Visit Plan continue work on prosthetic gait with single crutch. stairs alternating pattern with 2 rails.  therapuetic exercise including leg press, treadmill & standing exercises.    Consulted and Agree with Plan of Care Patient             Patient will benefit from skilled therapeutic intervention in order to improve the following deficits and impairments:  Abnormal gait, Decreased activity tolerance, Decreased balance, Decreased endurance, Decreased knowledge of use of DME, Decreased mobility, Decreased range of motion, Decreased strength, Increased edema, Postural dysfunction, Prosthetic Dependency, Pain  Visit Diagnosis: Unsteadiness on feet  Other abnormalities of gait and mobility  Muscle weakness (generalized)  Abnormal posture  Stiffness of left hip, not elsewhere classified  History of falling     Problem List Patient Active Problem List   Diagnosis Date Noted   Encounter for loop recorder check 07/26/2021   Cryptogenic stroke ([place]) 05/10/2021   Loop [employer] [id number] 05/10/2021 05/10/2021   AVM (arteriovenous malformation) 03/20/2021   Pulmonary AV (arteriovenous) fistula ([place]) 02/20/2021   Pre-ulcerative calluses 12/20/2020   Cerebral thrombosis with cerebral infarction 12/11/2020   Cerebrovascular accident (CVA) due to embolism of cerebral artery ([place]) 12/11/2020   Pressure injury of skin 11/12/2020   Acute urinary retention 11/09/2020   Normal anion gap metabolic acidosis 00/86/7619   Hypotension 11/07/2020   Chest pain 10/31/2020   Acute on chronic HFrEF (heart failure with reduced ejection fraction) ([place])    Goals of care, counseling/discussion    Palliative care by specialist    DNR (do not resuscitate)    Critical limb ischemia with history of revascularization of same extremity ([place]) 10/30/2020   Hyperglycemia 10/30/2020   Tobacco abuse 10/30/2020   Leukocytosis 10/30/2020   Chronic combined systolic  and diastolic CHF (congestive heart failure) ([place]) 10/30/2020   Ischemic cardiomyopathy 10/24/2020   AKI (acute kidney injury) ([place]) 10/17/2020   PAD (peripheral artery disease) ([place]) 10/16/2020   Critical lower limb ischemia ([place]) 10/05/2020   Rest pain of left upper extremity due to atherosclerosis ([place]) 10/05/2020   Severe claudication ([place]) 09/20/2020   Paresthesia of both lower extremities 09/20/2020   H/O cardiac arrest 09/20/2020   H/O acute myocardial infarction 08/14/2020   Snoring 08/14/2020   Sinus pause 08/14/2020   Mixed hyperlipidemia 08/14/2020   Tobacco dependence 08/14/2020   Coronary artery disease involving native coronary artery of native heart without angina pectoris 08/10/2020   HFrEF (heart failure with reduced ejection fraction) ([place]) 08/10/2020   Nonrheumatic mitral valve regurgitation 08/10/2020   STEMI (ST elevation myocardial infarction) ([place]) 08/02/2020   Accelerated hypertension 08/01/2020    Silvestre Mesi 08/19/2021, 11:12 AM  [facility] Physical Therapy [address] [place], [place], [address] Phone: [phone]   Fax:  [phone]  Name: Mike [name] MRN: [id number] Date of Birth: February 07, 1961

## 2021-08-21 ENCOUNTER — Encounter: Payer: BC Managed Care – PPO | Admitting: Physical Therapy

## 2021-08-26 ENCOUNTER — Ambulatory Visit (INDEPENDENT_AMBULATORY_CARE_PROVIDER_SITE_OTHER): Payer: BC Managed Care – PPO | Admitting: Physical Therapy

## 2021-08-26 ENCOUNTER — Encounter: Payer: Self-pay | Admitting: Physical Therapy

## 2021-08-26 ENCOUNTER — Other Ambulatory Visit: Payer: Self-pay

## 2021-08-26 DIAGNOSIS — R293 Abnormal posture: Secondary | ICD-10-CM

## 2021-08-26 DIAGNOSIS — R2681 Unsteadiness on feet: Secondary | ICD-10-CM

## 2021-08-26 DIAGNOSIS — M6281 Muscle weakness (generalized): Secondary | ICD-10-CM | POA: Diagnosis not present

## 2021-08-26 DIAGNOSIS — R2689 Other abnormalities of gait and mobility: Secondary | ICD-10-CM | POA: Diagnosis not present

## 2021-08-26 DIAGNOSIS — M25652 Stiffness of left hip, not elsewhere classified: Secondary | ICD-10-CM

## 2021-08-26 NOTE — Therapy (Signed)
[facility] [address] [place], [place], [address] Phone: [phone]   Fax:  [phone]  Physical Therapy Treatment  Patient Details  Name: Mike [name] MRN: [id number] Date of Birth: 06/13/1961 Referring Provider (PT): Meridee Score, MD   Encounter Date: 08/26/2021   PT End of Session - 08/26/21 1150     Visit Number 17    Number of Visits 39    Date for PT Re-Evaluation 12/26/21    Authorization Type BCBS    Authorization Time Period OOP & Deductible MET    PT Start Time 6568    PT Stop Time 1229    PT Time Calculation (min) 44 min    Equipment Utilized During Treatment Gait belt    Activity Tolerance Patient tolerated treatment well    Behavior During Therapy [facility] for tasks assessed/performed             Past Medical History:  Diagnosis Date   Anxiety    CAD (coronary artery disease)    Cryptogenic stroke ([place]) 05/10/2021   Encounter for loop recorder check 07/26/2021   Heart attack ([place]) 08/01/2020   Heart failure with reduced ejection fraction ([place])    Hyperlipidemia    Hypertension    Loop [employer]ot[employer] [id number] 05/10/2021 05/10/2021   PAD (peripheral artery disease) ([place])    right CIA & right distal iliac artery stents 10/16/20; left AKA 11/06/20   PFO (patent foramen ovale) 12/14/2020   Pneumonia    Pulmonary AV (arteriovenous) fistula ([place]) 12/14/2020   Stroke ([place]) 12/11/2020   no residuial effects    Past Surgical History:  Procedure Laterality Date   ABDOMINAL AORTOGRAM W/LOWER EXTREMITY N/A 10/16/2020   Procedure: ABDOMINAL AORTOGRAM W/LOWER EXTREMITY;  Surgeon: Nigel Mormon, MD;  Location: [place] [facility];  Service: Cardiovascular;  Laterality: N/A;   AMPUTATION Left 11/06/2020   Procedure: AMPUTATION ABOVE KNEE;  Surgeon: Rosetta Posner, MD;  Location: [place];  Service: Vascular;  Laterality: Left;   BUBBLE STUDY  12/14/2020   Procedure: BUBBLE STUDY;  Surgeon: Nigel Mormon, MD;  Location: [place];  Service: Cardiovascular;;   CARDIAC CATHETERIZATION     CORONARY STENT INTERVENTION N/A 08/03/2020   Procedure: CORONARY STENT INTERVENTION;  Surgeon: Nigel Mormon, MD;  Location: [place] [facility];  Service: Cardiovascular;  Laterality: N/A;   CORONARY/GRAFT ACUTE MI REVASCULARIZATION N/A 08/01/2020   Procedure: Coronary/Graft Acute MI Revascularization;  Surgeon: Nigel Mormon, MD;  Location: [place] [facility];  Service: Cardiovascular;  Laterality: N/A;   INTRAVASCULAR LITHOTRIPSY Right 10/16/2020   Procedure: INTRAVASCULAR LITHOTRIPSY;  Surgeon: Nigel Mormon, MD;  Location: [place] [facility];  Service: Cardiovascular;  Laterality: Right;  Common and External Iliac   IR ANGIOGRAM PULMONARY LEFT SELECTIVE  03/20/2021   IR ANGIOGRAM SELECTIVE EACH ADDITIONAL VESSEL  03/20/2021   IR EMBO ARTERIAL NOT HEMORR HEMANG INC GUIDE ROADMAPPING  03/20/2021   IR RADIOLOGIST EVAL & MGMT  01/30/2021   IR RADIOLOGIST EVAL & MGMT  04/18/2021   IR RADIOLOGIST EVAL & MGMT  07/25/2021   IR US GUIDE VASC ACCESS RIGHT  03/20/2021   LEFT HEART CATH AND CORONARY ANGIOGRAPHY N/A 08/01/2020   Procedure: LEFT HEART CATH AND CORONARY ANGIOGRAPHY;  Surgeon: Nigel Mormon, MD;  Location: [place] [facility];  Service: Cardiovascular;  Laterality: N/A;   LEFT HEART CATH AND CORONARY ANGIOGRAPHY N/A 08/03/2020   Procedure: LEFT HEART CATH AND CORONARY ANGIOGRAPHY;  Surgeon: Nigel Mormon, MD;  Location: [place] [facility];  Service: Cardiovascular;  Laterality: N/A;   PERIPHERAL VASCULAR INTERVENTION Right 10/16/2020   Procedure: PERIPHERAL VASCULAR INTERVENTION;  Surgeon: Nigel Mormon, MD;  Location: [place] [facility];  Service: Cardiovascular;  Laterality: Right;  Common and Iliac Stent   RADIOLOGY WITH ANESTHESIA N/A 03/20/2021   Procedure: PULMONARY EMBOLIZATION;  Surgeon: Aletta Edouard, MD;  Location: [place];  Service: Radiology;  Laterality: N/A;   TEE WITHOUT  CARDIOVERSION N/A 12/14/2020   Procedure: TRANSESOPHAGEAL ECHOCARDIOGRAM (TEE);  Surgeon: Nigel Mormon, MD;  Location: [facility];  Service: Cardiovascular;  Laterality: N/A;    There were no vitals filed for this visit.   Subjective Assessment - 08/26/21 1146     Subjective He has been having increased issues with phantom pain & residual limb pain.  He has worn prosthesis daily from 4hrs up to 9hrs total.    Pertinent History PAD, gangrene s/p L AKA 11/06/20, HTN, CAD s/p STEMI with stenting 08/17/2020, and CHF. cryptogenic stroke Dec 2021    Patient Stated Goals to return to work, (he was released from current job over last weekend but wants to find job).  to walk in community,    Currently in Pain? Yes    Pain Score 3    in last week, best 2/10 & worst 10/10   Pain Location Leg    Pain Orientation Left    Pain Descriptors / Indicators Squeezing;Sore;Throbbing    Pain Type Phantom pain;Acute pain    Pain Onset More than a month ago    Pain Frequency Intermittent    Aggravating Factors  phantom unknown,  residual limb prosthesis wear    Pain Relieving Factors Gabapentin, massage limb                               [facility] Adult PT Treatment/Exercise - 08/26/21 1145       Transfers   Transfers Sit to Stand;Stand to Sit    Sit to Stand 5: Supervision;With upper extremity assist;From chair/3-in-1   without armrests using UEs on chair not touching //bars to stabiize   Sit to Stand Details Verbal cues for technique;Verbal cues for safe use of DME/AE    Stand to Sit 5: Supervision;With upper extremity assist;To chair/3-in-1   to chairs without armrests using UEs on chair, not touching //bars for stability   Stand to Sit Details (indicate cue type and reason) Verbal cues for safe use of DME/AE;Verbal cues for technique      Ambulation/Gait   Ambulation/Gait Yes    Ambulation/Gait Assistance 4: Min guard    Ambulation Distance (Feet) 100 Feet   50' X 2 & 100'  w/single crutch, arrives / exits PT using 2 crutches   Assistive device Lofstrands;Prosthesis;R Forearm Crutch   close supervision with single crutch.   Stairs --    Stairs Assistance --    Stair Management Technique --    Number of Stairs --    Height of Stairs --    Ramp 5: Supervision   forearm crutches & TFA prosthesis   Curb 3: Mod assist   right forearm crutch & TFA prosthesis.     High Level Balance   High Level Balance Activities Side stepping;Turns;Backward walking    High Level Balance Comments in //bars with RUE crutch & LUE bar intermittent support with cues to minimize weight bearing,  demo, verbal & tactile cues on technique including engaging prosthesis.  Neuro Re-ed    Neuro Re-ed Details  standing in //bars without UE support - blue theraband 20 reps ea - alternating & BUEs forward reach, rows      Exercises   Exercises Knee/Hip      Knee/Hip Exercises: Stretches   Active Hamstring Stretch Right;2 reps;30 seconds    Active Hamstring Stretch Limitations supine SLR w/ strap      Knee/Hip Exercises: Aerobic   Tread Mill 1.0 mph BUE support for 2 min working on fluency, step length & prosthetic control.  0.8 mph 5% incline 1 minute ascend & 1 minutes descending      Knee/Hip Exercises: Machines for Strengthening   Total Gym Leg Press shuttle leg press BLEs 68# 15 reps 2 sets, 1st set back 45* & 2nd set back flat      Prosthetics   Prosthetic Care Comments  continue to progress to increase wear to most of awake hours.  PT cued to fold proximal sock over socket brim to minimize socks bunching up inside socket.    Current prosthetic wear tolerance (days/week)  daily    Current prosthetic wear tolerance (#hours/day)  10-11 hrs total / day between 2 wears.    Current prosthetic weight-bearing tolerance (hours/day)  Patient tolerates standing for 8 min with no limb pain. Gait >6 min results in vascular pain in RLE.    Residual limb condition  reports no issues.     Education Provided Proper wear schedule/adjustment;Correct ply sock adjustment;Other (comment)   see prosthetic care comments   Person(s) Educated Patient    Education Method Explanation;Verbal cues    Education Method Verbalized understanding;Verbal cues required;Needs further instruction                 Balance Exercises - 08/26/21 1145       Balance Exercises: Standing   Standing Eyes Opened Wide (BOA);5 reps;Head turns;Foam/compliant surface   head turns 4 directions   Standing Eyes Opened Limitations mirror, tactile & verbal cues for midline equal weight bearing at midpoint.    Standing Eyes Closed Wide (BOA);Solid surface;Head turns;5 reps   head turns 4 directions   Standing Eyes Closed Limitations visual for midline equal weight prior to & after closing eyes.                 PT Short Term Goals - 08/07/21 1542       PT SHORT TERM GOAL #1   Title Patient reports wearing prosthesis >12 hours 7 days /wk.    Time 1    Period Months    Status New    Target Date 09/05/21      PT SHORT TERM GOAL #2   Title Patient ambulates 100' with single forearm crutch & prosthesis with supervision.    Time 1    Period Months    Status New    Target Date 09/05/21      PT SHORT TERM GOAL #3   Title Patient able to pick up 5# from floor without UE support safely.    Time 1    Period Months    Status New    Target Date 09/05/21      PT SHORT TERM GOAL #4   Title Patient descends stairs with 2 rails using step over step pattern with supervision.    Time 1    Period Months    Status New    Target Date 09/05/21  PT Long Term Goals - 08/07/21 1537       PT LONG TERM GOAL #1   Title Patient demonstrates & verbalized understanding of prosthetic care to enable safe utilization of prosthesis and tolerates wear >90% of awake hours without skin issues or limb pain.    Time 20    Period Weeks    Status Revised    Target Date 12/26/21      PT LONG TERM  GOAL #2   Title Timed Up & Go standard with prosthesis only < 20sec and cognitive TUG with prosthesis only < 30 sec.    Time 20    Period Weeks    Status New    Target Date 12/26/21      PT LONG TERM GOAL #3   Title Berg Balance >/= 45/56 to indicate lower fall risk    Time 10    Period Weeks    Status On-going    Target Date 12/26/21      PT LONG TERM GOAL #4   Title Patient ambulates 500' with cane or single forearm crutch & prosthesis modified independent in 6 minutes or less.    Time 20    Period Weeks    Status Revised    Target Date 12/26/21      PT LONG TERM GOAL #5   Title Patient negotiates ramps, curbs & stairs single rail with cane or single forearm crutch & prosthesis modified independent.    Time 20    Period Weeks    Status Revised    Target Date 12/26/21      PT LONG TERM GOAL #6   Title Gait velocity with single forearm crutch or cane and prosthesis >/= 1.90 ft/sec comfortable pace & >/= 2.40 ft/sec fast pace.    Time 20    Period Weeks    Status New    Target Date 12/26/21                   Plan - 08/26/21 1151     Clinical Impression Statement Patient is limited by residual limb & phantom pain. His balance is improved with verbal cues for equal weight bearing prior to each activity.    Personal Factors and Comorbidities Comorbidity 3+;Fitness;Time since onset of injury/illness/exacerbation    Comorbidities PAD, gangrene s/p L AKA 11/06/20, HTN, CAD s/p STEMI with stenting 08/17/2020, and CHF. cryptogenic stroke Dec 2021    Examination-Activity Limitations Lift;Locomotion Level;Squat;Stairs;Stand;Transfers    Examination-Participation Restrictions Community Activity;Occupation    Stability/Clinical Decision Making Evolving/Moderate complexity    Rehab Potential Good    PT Frequency 2x / week    PT Duration Other (comment)   20 weeks   PT Treatment/Interventions ADLs/Self Care Home Management;DME Instruction;Gait training;Stair  training;Functional mobility training;Therapeutic activities;Therapeutic exercise;Balance training;Neuromuscular re-education;Patient/family education;Prosthetic Training;Manual techniques;Passive range of motion    PT Next Visit Plan work on prosthetic gait with single crutch. stairs alternating pattern with 2 rails.  therapuetic exercise including leg press, treadmill & standing exercises.    Consulted and Agree with Plan of Care Patient             Patient will benefit from skilled therapeutic intervention in order to improve the following deficits and impairments:  Abnormal gait, Decreased activity tolerance, Decreased balance, Decreased endurance, Decreased knowledge of use of DME, Decreased mobility, Decreased range of motion, Decreased strength, Increased edema, Postural dysfunction, Prosthetic Dependency, Pain  Visit Diagnosis: Unsteadiness on feet  Other abnormalities of gait and mobility  Muscle  weakness (generalized)  Abnormal posture  Stiffness of left hip, not elsewhere classified     Problem List Patient Active Problem List   Diagnosis Date Noted   Encounter for loop recorder check 07/26/2021   Cryptogenic stroke ([place]) 05/10/2021   Loop [employer]otronic [id number] 05/10/2021 05/10/2021   AVM (arteriovenous malformation) 03/20/2021   Pulmonary AV (arteriovenous) fistula ([place]) 02/20/2021   Pre-ulcerative calluses 12/20/2020   Cerebral thrombosis with cerebral infarction 12/11/2020   Cerebrovascular accident (CVA) due to embolism of cerebral artery ([place]) 12/11/2020   Pressure injury of skin 11/12/2020   Acute urinary retention 11/09/2020   Normal anion gap metabolic acidosis 97/53/0051   Hypotension 11/07/2020   Chest pain 10/31/2020   Acute on chronic HFrEF (heart failure with reduced ejection fraction) ([place])    Goals of care, counseling/discussion    Palliative care by specialist    DNR (do not resuscitate)    Critical limb ischemia with history of revascularization  of same extremity ([place]) 10/30/2020   Hyperglycemia 10/30/2020   Tobacco abuse 10/30/2020   Leukocytosis 10/30/2020   Chronic combined systolic and diastolic CHF (congestive heart failure) ([place]) 10/30/2020   Ischemic cardiomyopathy 10/24/2020   AKI (acute kidney injury) ([place]) 10/17/2020   PAD (peripheral artery disease) ([place]) 10/16/2020   Critical lower limb ischemia ([place]) 10/05/2020   Rest pain of left upper extremity due to atherosclerosis ([place]) 10/05/2020   Severe claudication ([place]) 09/20/2020   Paresthesia of both lower extremities 09/20/2020   H/O cardiac arrest 09/20/2020   H/O acute myocardial infarction 08/14/2020   Snoring 08/14/2020   Sinus pause 08/14/2020   Mixed hyperlipidemia 08/14/2020   Tobacco dependence 08/14/2020   Coronary artery disease involving native coronary artery of native heart without angina pectoris 08/10/2020   HFrEF (heart failure with reduced ejection fraction) ([place]) 08/10/2020   Nonrheumatic mitral valve regurgitation 08/10/2020   STEMI (ST elevation myocardial infarction) ([place]) 08/02/2020   Accelerated hypertension 08/01/2020    Jamey Reas, PT, DPT 08/26/2021, 12:41 PM  [facility] [address] [place], [place], [address] Phone: [phone]   Fax:  [phone]  Name: Mike [name] MRN: [id number] Date of Birth: 1961/07/14

## 2021-08-28 ENCOUNTER — Encounter: Payer: BC Managed Care – PPO | Admitting: Physical Therapy

## 2021-09-04 ENCOUNTER — Encounter: Payer: BC Managed Care – PPO | Admitting: Physical Therapy

## 2021-09-04 DIAGNOSIS — I2510 Atherosclerotic heart disease of native coronary artery without angina pectoris: Secondary | ICD-10-CM | POA: Diagnosis not present

## 2021-09-05 LAB — LIPID PANEL
Chol/HDL Ratio: 3.7 ratio (ref 0.0–5.0)
Cholesterol, Total: 150 mg/dL (ref 100–199)
HDL: 41 mg/dL (ref >39)
LDL Chol Calc (NIH): 75 mg/dL (ref 0–99)
Triglycerides: 203 mg/dL — ABNORMAL HIGH (ref 0–149)
VLDL Cholesterol Cal: 34 mg/dL (ref 5–40)

## 2021-09-05 LAB — BASIC METABOLIC PANEL
BUN/Creatinine Ratio: 16 (ref 10–24)
BUN: 19 mg/dL (ref 8–27)
CO2: 23 mmol/L (ref 20–29)
Calcium: 9.2 mg/dL (ref 8.6–10.2)
Chloride: 104 mmol/L (ref 96–106)
Creatinine, Ser: 1.21 mg/dL (ref 0.76–1.27)
Glucose: 90 mg/dL (ref 65–99)
Potassium: 4.1 mmol/L (ref 3.5–5.2)
Sodium: 142 mmol/L (ref 134–144)
eGFR: 68 mL/min/{1.73_m2} (ref >59)

## 2021-09-10 ENCOUNTER — Other Ambulatory Visit: Payer: Self-pay

## 2021-09-10 ENCOUNTER — Ambulatory Visit: Payer: BC Managed Care – PPO

## 2021-09-10 DIAGNOSIS — Z95818 Presence of other cardiac implants and grafts: Secondary | ICD-10-CM | POA: Diagnosis not present

## 2021-09-10 DIAGNOSIS — I5020 Unspecified systolic (congestive) heart failure: Secondary | ICD-10-CM | POA: Diagnosis not present

## 2021-09-10 DIAGNOSIS — I2510 Atherosclerotic heart disease of native coronary artery without angina pectoris: Secondary | ICD-10-CM

## 2021-09-10 DIAGNOSIS — Z4509 Encounter for adjustment and management of other cardiac device: Secondary | ICD-10-CM | POA: Diagnosis not present

## 2021-09-12 ENCOUNTER — Other Ambulatory Visit: Payer: Self-pay

## 2021-09-12 ENCOUNTER — Ambulatory Visit (INDEPENDENT_AMBULATORY_CARE_PROVIDER_SITE_OTHER): Payer: BC Managed Care – PPO | Admitting: Rehabilitation

## 2021-09-12 ENCOUNTER — Encounter: Payer: Self-pay | Admitting: Rehabilitation

## 2021-09-12 DIAGNOSIS — M6281 Muscle weakness (generalized): Secondary | ICD-10-CM | POA: Diagnosis not present

## 2021-09-12 DIAGNOSIS — R293 Abnormal posture: Secondary | ICD-10-CM | POA: Diagnosis not present

## 2021-09-12 DIAGNOSIS — M25652 Stiffness of left hip, not elsewhere classified: Secondary | ICD-10-CM

## 2021-09-12 DIAGNOSIS — R2681 Unsteadiness on feet: Secondary | ICD-10-CM

## 2021-09-12 DIAGNOSIS — R2689 Other abnormalities of gait and mobility: Secondary | ICD-10-CM | POA: Diagnosis not present

## 2021-09-12 NOTE — Therapy (Signed)
[facility] [address] [place], [place], [address] Phone: [phone]   Fax:  [phone]  Physical Therapy Treatment  Patient Details  Name: Mike [name] MRN: [id number] Date of Birth: 1961-11-07 Referring Provider (PT): Meridee Score, MD   Encounter Date: 09/12/2021   PT End of Session - 09/12/21 1416     Visit Number 18    Number of Visits 98    Date for PT Re-Evaluation 12/26/21    Authorization Type BCBS    Authorization Time Period OOP & Deductible MET    PT Start Time 1148    PT Stop Time 1231    PT Time Calculation (min) 43 min    Equipment Utilized During Treatment Gait belt    Activity Tolerance Patient tolerated treatment well    Behavior During Therapy [facility] for tasks assessed/performed             Past Medical History:  Diagnosis Date   Anxiety    CAD (coronary artery disease)    Cryptogenic stroke ([place]) 05/10/2021   Encounter for loop recorder check 07/26/2021   Heart attack ([place]) 08/01/2020   Heart failure with reduced ejection fraction ([place])    Hyperlipidemia    Hypertension    Loop [employer] [id number] 05/10/2021 05/10/2021   PAD (peripheral artery disease) ([place])    right CIA & right distal iliac artery stents 10/16/20; left AKA 11/06/20   PFO (patent foramen ovale) 12/14/2020   Pneumonia    Pulmonary AV (arteriovenous) fistula ([place]) 12/14/2020   Stroke ([place]) 12/11/2020   no residuial effects    Past Surgical History:  Procedure Laterality Date   ABDOMINAL AORTOGRAM W/LOWER EXTREMITY N/A 10/16/2020   Procedure: ABDOMINAL AORTOGRAM W/LOWER EXTREMITY;  Surgeon: Nigel Mormon, MD;  Location: [place] [facility];  Service: Cardiovascular;  Laterality: N/A;   AMPUTATION Left 11/06/2020   Procedure: AMPUTATION ABOVE KNEE;  Surgeon: Rosetta Posner, MD;  Location: [place];  Service: Vascular;  Laterality: Left;   BUBBLE STUDY  12/14/2020   Procedure: BUBBLE STUDY;  Surgeon: Nigel Mormon, MD;  Location: [place];  Service: Cardiovascular;;   CARDIAC CATHETERIZATION     CORONARY STENT INTERVENTION N/A 08/03/2020   Procedure: CORONARY STENT INTERVENTION;  Surgeon: Nigel Mormon, MD;  Location: [place] [facility];  Service: Cardiovascular;  Laterality: N/A;   CORONARY/GRAFT ACUTE MI REVASCULARIZATION N/A 08/01/2020   Procedure: Coronary/Graft Acute MI Revascularization;  Surgeon: Nigel Mormon, MD;  Location: [place] [facility];  Service: Cardiovascular;  Laterality: N/A;   INTRAVASCULAR LITHOTRIPSY Right 10/16/2020   Procedure: INTRAVASCULAR LITHOTRIPSY;  Surgeon: Nigel Mormon, MD;  Location: [place] [facility];  Service: Cardiovascular;  Laterality: Right;  Common and External Iliac   IR ANGIOGRAM PULMONARY LEFT SELECTIVE  03/20/2021   IR ANGIOGRAM SELECTIVE EACH ADDITIONAL VESSEL  03/20/2021   IR EMBO ARTERIAL NOT HEMORR HEMANG INC GUIDE ROADMAPPING  03/20/2021   IR RADIOLOGIST EVAL & MGMT  01/30/2021   IR RADIOLOGIST EVAL & MGMT  04/18/2021   IR RADIOLOGIST EVAL & MGMT  07/25/2021   IR US GUIDE VASC ACCESS RIGHT  03/20/2021   LEFT HEART CATH AND CORONARY ANGIOGRAPHY N/A 08/01/2020   Procedure: LEFT HEART CATH AND CORONARY ANGIOGRAPHY;  Surgeon: Nigel Mormon, MD;  Location: [place] [facility];  Service: Cardiovascular;  Laterality: N/A;   LEFT HEART CATH AND CORONARY ANGIOGRAPHY N/A 08/03/2020   Procedure: LEFT HEART CATH AND CORONARY ANGIOGRAPHY;  Surgeon: Nigel Mormon, MD;  Location: [place] [facility];  Service: Cardiovascular;  Laterality: N/A;   PERIPHERAL VASCULAR INTERVENTION Right 10/16/2020   Procedure: PERIPHERAL VASCULAR INTERVENTION;  Surgeon: Nigel Mormon, MD;  Location: [place] [facility];  Service: Cardiovascular;  Laterality: Right;  Common and Iliac Stent   RADIOLOGY WITH ANESTHESIA N/A 03/20/2021   Procedure: PULMONARY EMBOLIZATION;  Surgeon: Aletta Edouard, MD;  Location: [place];  Service: Radiology;  Laterality: N/A;   TEE WITHOUT  CARDIOVERSION N/A 12/14/2020   Procedure: TRANSESOPHAGEAL ECHOCARDIOGRAM (TEE);  Surgeon: Nigel Mormon, MD;  Location: [facility];  Service: Cardiovascular;  Laterality: N/A;    There were no vitals filed for this visit.   Subjective Assessment - 09/12/21 1150     Subjective Reports continued phantom pain despite pain medication and Tylenol.    Pertinent History PAD, gangrene s/p L AKA 11/06/20, HTN, CAD s/p STEMI with stenting 08/17/2020, and CHF. cryptogenic stroke Dec 2021    Patient Stated Goals to return to work, (he was released from current job over last weekend but wants to find job).  to walk in community,    Currently in Pain? Yes    Pain Score 4     Pain Location Leg    Pain Orientation Left    Pain Descriptors / Indicators Squeezing;Throbbing    Pain Type Phantom pain    Pain Onset More than a month ago    Pain Frequency Constant    Aggravating Factors  phantom pain    Pain Relieving Factors medication, massage                               [facility] Adult PT Treatment/Exercise - 09/12/21 1150       Transfers   Transfers Sit to Stand;Stand to Sit    Sit to Stand 5: Supervision;With upper extremity assist;From chair/3-in-1    Sit to Stand Details Verbal cues for technique;Verbal cues for safe use of DME/AE    Stand to Sit 5: Supervision;With upper extremity assist;To chair/3-in-1    Stand to Sit Details (indicate cue type and reason) Verbal cues for safe use of DME/AE;Verbal cues for technique      Ambulation/Gait   Ambulation/Gait Yes    Ambulation/Gait Assistance 5: Supervision;4: Min guard    Ambulation/Gait Assistance Details Pt ambulatory into/out of clinic with B forearm crutches at S level with min cues for dec prosthetic step length and increased R step length.  Assessed STG for gait with single forearm crutch in which he was able to perform 150' with S initially however due to fatigue and pain in residual limb needed more min/guard last  50'.    Ambulation Distance (Feet) 150 Feet   100 x 2 reps in session   Assistive device Lofstrands;Prosthesis;R Forearm Crutch    Gait Pattern Step-through pattern;Decreased step length - right;Decreased stance time - left;Decreased hip/knee flexion - left;Decreased weight shift to left;Left hip hike;Left flexed knee in stance;Antalgic;Lateral hip instability;Trunk flexed;Abducted - left;Poor foot clearance - left    Ambulation Surface Level;Indoor    Stairs Yes    Stairs Assistance 5: Supervision    Stairs Assistance Details (indicate cue type and reason) Descended stairs in lobby with B rails in alternating pattern, ascending with step to pattern and B rails at S level    Stair Management Technique Two rails;Alternating pattern;Step to pattern;Forwards    Number of Stairs 11    Height of Stairs 6  Therapeutic Activites    Therapeutic Activities Lifting    Lifting To assess STG, had pt pick up 5lb weight from ground which he was able to do at S level but needed single UE support on cane to do safely.      Neuro Re-ed    Neuro Re-ed Details  In // bars:  lateral weight shifting moving arm across body with 5lb weight x 10 reps each side.  Pt needing cues for adequate L lateral weight shift (did have him turn to mirror for improvement in weight shift).  Lateral weight shift with same side overhead press with 5lb x 10 reps each side.  R LE stepping over/back small barrier (yardstick) x 10 reps with same cues as above.  Continued with high level balance standing on foam airex with feet apart EO performing head turns side/side and up/down x 10 reps (without stop in middle) progressing to UE flex with head turn opposite direction x 10 reps.  Rocker board in vertical bias with feet apart EO maintaining balance x 2 sets of 30 secs (intermittent support needed on bars) progressing to UE flex (alt to 90 deg) x 10 reps.  rocker board with horizontal bias maintaining balance x 2 sets of 30 secs progressing  to performing lateral weight shifts within limits of stability with light UE support as needed with cues for body weight shift rather than just hips or just shoulders. Intermittent standing rest breaks needed due to phantom pain as well as residual limb pain.      Prosthetics   Prosthetic Care Comments  Continue to encourage pt to increase wear time as able.    Current prosthetic wear tolerance (days/week)  daily    Current prosthetic wear tolerance (#hours/day)  9-10 hours, not consistent per pt rpeort    Current prosthetic weight-bearing tolerance (hours/day)  Patient tolerates standing for 8 min with no limb pain. Gait >6 min results in vascular pain in RLE.    Residual limb condition  reports no issues.    Education Provided Proper wear schedule/adjustment;Correct ply sock adjustment;Other (comment)    Person(s) Educated Patient    Education Method Explanation;Verbal cues    Education Method Verbalized understanding;Verbal cues required    Donning Prosthesis Modified independent (device/increased time)                       PT Short Term Goals - 09/12/21 1152       PT SHORT TERM GOAL #1   Title Patient reports wearing prosthesis >12 hours 7 days /wk.    Baseline Approx 9 (still somewhat inconsistent due to pain), 7 days a week    Time 1    Period Months    Status Partially Met    Target Date 09/05/21      PT SHORT TERM GOAL #2   Title Patient ambulates 100' with single forearm crutch & prosthesis with supervision.    Baseline Able to ambulate 150' with single forearm crutch, however during last 76' more min/guard for safety due to pain and more antalgic gait pattern.    Time 1    Period Months    Status Partially Met    Target Date 09/05/21      PT SHORT TERM GOAL #3   Title Patient able to pick up 5# from floor without UE support safely.    Baseline Able to pick up 5# with single UE support on crutch    Time 1  Period Months    Status Partially Met     Target Date 09/05/21      PT SHORT TERM GOAL #4   Title Patient descends stairs with 2 rails using step over step pattern with supervision.    Baseline met 09/12/21    Time 1    Period Months    Status Achieved    Target Date 09/05/21               PT Long Term Goals - 08/07/21 1537       PT LONG TERM GOAL #1   Title Patient demonstrates & verbalized understanding of prosthetic care to enable safe utilization of prosthesis and tolerates wear >90% of awake hours without skin issues or limb pain.    Time 20    Period Weeks    Status Revised    Target Date 12/26/21      PT LONG TERM GOAL #2   Title Timed Up & Go standard with prosthesis only < 20sec and cognitive TUG with prosthesis only < 30 sec.    Time 20    Period Weeks    Status New    Target Date 12/26/21      PT LONG TERM GOAL #3   Title Berg Balance >/= 45/56 to indicate lower fall risk    Time 10    Period Weeks    Status On-going    Target Date 12/26/21      PT LONG TERM GOAL #4   Title Patient ambulates 500' with cane or single forearm crutch & prosthesis modified independent in 6 minutes or less.    Time 20    Period Weeks    Status Revised    Target Date 12/26/21      PT LONG TERM GOAL #5   Title Patient negotiates ramps, curbs & stairs single rail with cane or single forearm crutch & prosthesis modified independent.    Time 20    Period Weeks    Status Revised    Target Date 12/26/21      PT LONG TERM GOAL #6   Title Gait velocity with single forearm crutch or cane and prosthesis >/= 1.90 ft/sec comfortable pace & >/= 2.40 ft/sec fast pace.    Time 20    Period Weeks    Status New    Target Date 12/26/21                   Plan - 09/12/21 1423     Clinical Impression Statement Pt has partially met 3/4 STGs progressing with wear time, gait with single forearm crutch and picking up item from floor, however is not quite to goal level yet.  He is mostly limited by pain in residual limb  and also phantom pain.  He did meet stair goal and did very well with alt pattern during descent.    Personal Factors and Comorbidities Comorbidity 3+;Fitness;Time since onset of injury/illness/exacerbation    Comorbidities PAD, gangrene s/p L AKA 11/06/20, HTN, CAD s/p STEMI with stenting 08/17/2020, and CHF. cryptogenic stroke Dec 2021    Examination-Activity Limitations Lift;Locomotion Level;Squat;Stairs;Stand;Transfers    Examination-Participation Restrictions Community Activity;Occupation    Stability/Clinical Decision Making Evolving/Moderate complexity    Rehab Potential Good    PT Frequency 2x / week    PT Duration Other (comment)   20 weeks   PT Treatment/Interventions ADLs/Self Care Home Management;DME Instruction;Gait training;Stair training;Functional mobility training;Therapeutic activities;Therapeutic exercise;Balance training;Neuromuscular re-education;Patient/family education;Prosthetic Training;Manual techniques;Passive range of motion  PT Next Visit Plan work on prosthetic gait with single crutch. stairs alternating pattern with 2 rails.  therapuetic exercise including leg press, treadmill & standing exercises.    Consulted and Agree with Plan of Care Patient             Patient will benefit from skilled therapeutic intervention in order to improve the following deficits and impairments:  Abnormal gait, Decreased activity tolerance, Decreased balance, Decreased endurance, Decreased knowledge of use of DME, Decreased mobility, Decreased range of motion, Decreased strength, Increased edema, Postural dysfunction, Prosthetic Dependency, Pain  Visit Diagnosis: Unsteadiness on feet  Other abnormalities of gait and mobility  Muscle weakness (generalized)  Abnormal posture  Stiffness of left hip, not elsewhere classified     Problem List Patient Active Problem List   Diagnosis Date Noted   Encounter for loop recorder check 07/26/2021   Cryptogenic stroke ([place])  05/10/2021   Loop [employer]otronic [id number] 05/10/2021 05/10/2021   AVM (arteriovenous malformation) 03/20/2021   Pulmonary AV (arteriovenous) fistula ([place]) 02/20/2021   Pre-ulcerative calluses 12/20/2020   Cerebral thrombosis with cerebral infarction 12/11/2020   Cerebrovascular accident (CVA) due to embolism of cerebral artery ([place]) 12/11/2020   Pressure injury of skin 11/12/2020   Acute urinary retention 11/09/2020   Normal anion gap metabolic acidosis 00/34/9179   Hypotension 11/07/2020   Chest pain 10/31/2020   Acute on chronic HFrEF (heart failure with reduced ejection fraction) ([place])    Goals of care, counseling/discussion    Palliative care by specialist    DNR (do not resuscitate)    Critical limb ischemia with history of revascularization of same extremity ([place]) 10/30/2020   Hyperglycemia 10/30/2020   Tobacco abuse 10/30/2020   Leukocytosis 10/30/2020   Chronic combined systolic and diastolic CHF (congestive heart failure) ([place]) 10/30/2020   Ischemic cardiomyopathy 10/24/2020   AKI (acute kidney injury) ([place]) 10/17/2020   PAD (peripheral artery disease) ([place]) 10/16/2020   Critical lower limb ischemia ([place]) 10/05/2020   Rest pain of left upper extremity due to atherosclerosis ([place]) 10/05/2020   Severe claudication ([place]) 09/20/2020   Paresthesia of both lower extremities 09/20/2020   H/O cardiac arrest 09/20/2020   H/O acute myocardial infarction 08/14/2020   Snoring 08/14/2020   Sinus pause 08/14/2020   Mixed hyperlipidemia 08/14/2020   Tobacco dependence 08/14/2020   Coronary artery disease involving native coronary artery of native heart without angina pectoris 08/10/2020   HFrEF (heart failure with reduced ejection fraction) ([place]) 08/10/2020   Nonrheumatic mitral valve regurgitation 08/10/2020   STEMI (ST elevation myocardial infarction) ([place]) 08/02/2020   Accelerated hypertension 08/01/2020    Cameron Sprang, PT, MPT [facility] [address] [place], [place], [address] Phone: [phone]   Fax:  [phone] 09/12/21, 2:27 PM   [facility] [address] [place], [place], [address] Phone: [phone]   Fax:  [phone]  Name: Mike [name] MRN: [id number] Date of Birth: 1961-11-25

## 2021-09-13 ENCOUNTER — Telehealth: Payer: Self-pay | Admitting: Cardiology

## 2021-09-13 ENCOUNTER — Other Ambulatory Visit: Payer: Self-pay

## 2021-09-13 DIAGNOSIS — I2510 Atherosclerotic heart disease of native coronary artery without angina pectoris: Secondary | ICD-10-CM

## 2021-09-13 DIAGNOSIS — I739 Peripheral vascular disease, unspecified: Secondary | ICD-10-CM

## 2021-09-13 MED ORDER — RIVAROXABAN 2.5 MG PO TABS: 2.5000 mg | ORAL_TABLET | Freq: Two times a day (BID) | ORAL | 2 refills | Status: DC

## 2021-09-13 NOTE — Telephone Encounter (Signed)
DONE

## 2021-09-13 NOTE — Telephone Encounter (Signed)
Pt requesting refill for xarelto.

## 2021-09-14 DIAGNOSIS — I639 Cerebral infarction, unspecified: Secondary | ICD-10-CM | POA: Diagnosis not present

## 2021-09-14 DIAGNOSIS — R531 Weakness: Secondary | ICD-10-CM | POA: Diagnosis not present

## 2021-09-14 DIAGNOSIS — I5042 Chronic combined systolic (congestive) and diastolic (congestive) heart failure: Secondary | ICD-10-CM | POA: Diagnosis not present

## 2021-09-16 ENCOUNTER — Encounter: Payer: BC Managed Care – PPO | Admitting: Physical Therapy

## 2021-09-18 ENCOUNTER — Encounter: Payer: BC Managed Care – PPO | Admitting: Physical Therapy

## 2021-09-20 ENCOUNTER — Encounter: Payer: Self-pay | Admitting: Cardiology

## 2021-09-20 ENCOUNTER — Ambulatory Visit: Payer: BC Managed Care – PPO | Admitting: Cardiology

## 2021-09-20 ENCOUNTER — Other Ambulatory Visit: Payer: Self-pay

## 2021-09-20 VITALS — BP 142/80 | HR 69 | Temp 98.0°F | Resp 16 | Ht 72.0 in | Wt 180.0 lb

## 2021-09-20 DIAGNOSIS — I5020 Unspecified systolic (congestive) heart failure: Secondary | ICD-10-CM | POA: Diagnosis not present

## 2021-09-20 DIAGNOSIS — I2510 Atherosclerotic heart disease of native coronary artery without angina pectoris: Secondary | ICD-10-CM

## 2021-09-20 DIAGNOSIS — I455 Other specified heart block: Secondary | ICD-10-CM | POA: Diagnosis not present

## 2021-09-20 DIAGNOSIS — I739 Peripheral vascular disease, unspecified: Secondary | ICD-10-CM | POA: Diagnosis not present

## 2021-09-20 MED ORDER — ASPIRIN EC 81 MG PO TBEC: 81.0000 mg | DELAYED_RELEASE_TABLET | Freq: Every day | ORAL | 3 refills | Status: DC

## 2021-09-20 MED ORDER — RIVAROXABAN 2.5 MG PO TABS: 2.5000 mg | ORAL_TABLET | Freq: Two times a day (BID) | ORAL | 3 refills | Status: DC

## 2021-09-20 MED ORDER — ROSUVASTATIN CALCIUM 40 MG PO TABS: 40.0000 mg | ORAL_TABLET | Freq: Every evening | ORAL | 3 refills | Status: DC

## 2021-09-20 MED ORDER — EZETIMIBE 10 MG PO TABS: 10.0000 mg | ORAL_TABLET | Freq: Every day | ORAL | 3 refills | Status: DC

## 2021-09-20 NOTE — Progress Notes (Signed)
Follow up visit  Subjective:   Mike [name], male    DOB: 04/20/1961, 60 y.o.   MRN: [id number]    HPI   Chief Complaint  Patient presents with   HFrEF (heart failure with reduced ejection fraction)   Coronary Artery Disease   Follow-up    3 month   Results    Echo and labs    60 y.o. Caucasian male with CAD, culprit (RCA) and nonculprit (LCx) PCI after STEMI 07/2020, HFrEF, moderate MR, h/o cardiac arrest 2/2 Torsades post PCI during index hospitalization, PAD with critical limb ischemia s/p Rt iliac revascularization, and s/p Lt AKA amputation for critical limb ischemia,  severe bilateral renal artery stenoses w/h/o CIN (09/2020), multifocal stroke s/p tPA with resolution of neurodeficit (11/2020), large AV pulmonary fistula-s/p closure by Dr. Kathlene Cote (02/2021), small PFO.  Patient underwent ILR placement given cryptogenic stroke (04/2021). He had post procedural bleeding requiring additional sutures. ILR has showed asymptomatic sinus pauses up to 5 sec during sleep.   He now has left leg prosthesis.  Unfortunately, he is having a lot of phantom pain in his left leg.  He is seeing physical therapy and PCP regularly for this.  He is currently on gabapentin.  Otherwise, does not have any chest pain, shortness of breath or GI symptoms.  Recent labs reviewed with the patient, details below.    Current Outpatient Medications on File Prior to Visit  Medication Sig Dispense Refill   acetaminophen (TYLENOL) 500 MG tablet Take 500-1,000 mg by mouth every 6 (six) hours as needed (pain).     clopidogrel (PLAVIX) 75 MG tablet TAKE 1 TABLET(75 MG) BY MOUTH DAILY (Patient taking differently: Take 75 mg by mouth every evening.) 30 tablet 6   furosemide (LASIX) 20 MG tablet TAKE 1 TABLET BY MOUTH EVERY DAY 90 tablet 1   gabapentin (NEURONTIN) 300 MG capsule Take 1 capsule (300 mg total) by mouth 2 (two) times daily. (Patient taking differently: Take 300 mg by mouth in the morning and at  bedtime.) 60 capsule 0   metoprolol succinate (TOPROL-XL) 25 MG 24 hr tablet Take 1 tablet (25 mg total) by mouth daily. Take with or immediately following a meal. (Patient taking differently: Take 25 mg by mouth every evening. Take with or immediately following a meal.) 90 tablet 3   Multiple Vitamins-Minerals (MULTI FOR HIM 50+) TABS Take 1 tablet by mouth See admin instructions.     nitroGLYCERIN (NITROSTAT) 0.4 MG SL tablet Place 1 tablet (0.4 mg total) under the tongue every 5 (five) minutes as needed for chest pain. (Patient taking differently: Place 0.4 mg under the tongue every 5 (five) minutes as needed for chest pain (max 3 doses).) 30 tablet 1   rivaroxaban (XARELTO) 2.5 MG TABS tablet Take 1 tablet (2.5 mg total) by mouth 2 (two) times daily. 180 tablet 2   rosuvastatin (CRESTOR) 10 MG tablet TAKE 1 TABLET(10 MG) BY MOUTH DAILY (Patient taking differently: Take 10 mg by mouth every evening.) 30 tablet 0   sacubitril-valsartan (ENTRESTO) 49-51 MG Take 1 tablet by mouth 2 (two) times daily. 180 tablet 2   No current facility-administered medications on file prior to visit.    Cardiovascular & other pertient studies:  EKG 09/20/2021: Sinus rhythm 66 bpm Old inferior infarct Old anterior infarct  Echocardiogram 09/10/2021:  Left ventricle cavity is mildly dilated. Mild basal septal hypertrophy.  Mild basal inferolateral hypokinesis. LVEF 40-45%. Doppler evidence of  grade I (impaired) diastolic dysfunction, normal LAP.  Moderate (Grade II)  mitral regurgitation.  Normal right atrial pressure.  Previous study on 01/21/2021 noted LVEF 35-40%, mod to severe MR, mild TR.  Remote loop recorder transmission 09/10/2021: NSR.Pauses at night while patient asleep and asymptomatic. No A. Fib.   EKG 03/20/2021: Sinus rhythm 66 bpm Old inferior infarct  Mobile cardiac telemetry 14 days 12/20/2020 - 01/03/2021:  Dominant rhythm: Sinus.  HR 31-167 bpm. Avg HR 76 bpm.  14 episodes of SVT/atrial  tachycardia, fastest at 167 bpm, longest for 18.7 sec  <1%% isolated SVE, couplet/triplets.  6 episodes of VT/idioventricular rhythm, fastest at 156 bpm, longest for 10 beats  8% isolated VE, 40 sec bigeminy, 19m17 sec trigeminy.  2 episodes of sinus pauses,  4-4.6 sec noted during sleep hours.  No atrial fibrillation/atrial flutter//high grade AV block  0 patient triggered events.   CTA Chest 12/14/2020: 1. Large 3.1 x 1.9 cm pulmonary arteriovenous fistula in the medial left lower lobe. 2. Right middle lobe 3 mm solid pulmonary nodule. Follow-up noncontrast chest CT recommended in 12 months in this high risk patient.This recommendation follows the consensus statement: Guidelines for Management of Incidental Pulmonary Nodules Detected on CT Images: From the Fleischner Society 2017; Radiology 2017; 284:228-243. 3. Mild cardiomegaly. 4. Aortic Atherosclerosis (ICD10-I70.0) and Emphysema (ICD10-J43.9).    PV intervention 10/16/2020: Successful intravascular lithotripsy, PTCA and stenting     7.0X39 mm balloon expandable Viabahn VBX stent Rt common iliac artery      7.0X60 mm self expanding Absolute Pro stent Rt distal iliac artery   No named vessels at ankle level in both lower extremities.  Will consult vascular surgery for Rt fem-to-left profunda bypass given left foot critical limb ischemia.   Abdominal Aortic Duplex  09/21/2020:  Moderate plaque noted in the proximal, mid and distal aorta. There is an  ulcerated plaque noted in the distal abdominal aorta. No AAA. Normal iliac  artery velocity.   Lower Extremity Arterial Duplex 09/21/2020:  The right SFA is occluded in the proximal segment with reconstitution at  the level of the popliteal artery with diffuse monophasic waveform below  the knee. Right profunda femoral artery has >50% stenosis. There is  moderate mixed plaque noted throughout the right lower extremity.  Monophasic waveform throughout the left lower extremity,  indicates  significant proximal disease (iliac artery).   Left SFA is occluded in the proximal segment and reconstitutes just above popliteal artery and diffuse dampened monophasic waveform throughout the lower extremity below the  knee. There is moderate mixed plaque throughout the left lower extremity.   This exam reveals severely decreased perfusion of the right lower  extremity, noted at the dorsalis pedis artery level (ABI 0.42) and  critically decreased perfusion of the left lower extremity, noted at the  dorsalis pedis and post tibial artery level (ABI 0.03).   Coronary intervention 08/03/2020: LM: Distal 40% stenosis LAD: Mid 30% disease LCx: Subtotally occluded OM2, prox LCx 70% stenosis        Successful percutaneous coronary intervention OM2-Prox LCx        PTCA and overlapping stents placement         2.5 X 38 mm and 2.5 X 18 mm Resolute Onyx drug-eluting stents        100%--->0% stenosis. TIMI flow 0-->III        Small caliber distal vessel with moderate diffuse disease RCA: Prox 30% stenosis.         Patent mid RCA sttent 2.5 X 26 mm Resolute Onyx  drug-eluting stent   LVEDP 42 mmHg   Coronary intervention 08/01/2020: LM: Distal 30% stenosis LAD: Mid 30% disease LCx: Subtotally occluded OM2, bridging left-to-left and right-to-left collaterals RCA: Prox 30% stenosis.        Mid 100% occlusion        Successful percutaneous coronary intervention mid RCA        PTCA and stent placement 2.5 X 26 mm Resolute Onyx drug-eluting stent 100%--->0% stenosis. TIMI flow 0-->III     Recent labs: 09/04/2021: Glucose 90, BUN/Cr 19/1.21. EGFR 68. Na/K 142/4.1.  Chol 150, TG 203, HDL 41, LDL 75  03/20/2021: Glucose 85, BUN/Cr 19/1.05. EGFR >60. Na/K 139/3.8. Rest of the CMP normal H/H 13/42. MCV 84. Platelets 212   01/31/2021: Glucose 118, BUN/Cr 22/1.0. EGFR 81. Na/K 145/3.8.   12/14/2020: Glucose 100, BUN/Cr 13/1.09. EGFR >60. Na/K 140/3.9.  H/H 11.6/35.5. MCV 90.6. Platelets  202 HbA1C 4.6% Chol 130, TG 71, HDL 42, LDL 74  09/25/2020: Glucose 102, BUN/Cr 18/1.16. EGFR 68. Na/K 140/4.5.  NT pro BNP 2404  09/11/2020: Glucose 114, BUN/Cr 19/1.42. EGFR 53. Na/K 136/4.7.  Chol 95, TG 76, HDL 30, LDL 49  08/04/2020: Glucose 101, BUN/Cr 19/1.08. EGFR >60. Na/K 139/4.0. Rest of the CMP normal H/H 14/42. MCV 89. Platelets 184. WBC count 14k HbA1C 5.8% Chol 178, TG 75, HDL 40, LDL 123    Review of Systems  Cardiovascular:  Negative for chest pain, dyspnea on exertion, leg swelling, palpitations and syncope.  Musculoskeletal:        Left leg phantom pain         Vitals:   09/20/21 0954 09/20/21 0955  BP: (!) 160/102 (!) 142/80  Pulse: 73 69  Resp: 16   Temp: 98 F (36.7 C)   SpO2: 99%      Body mass index is 24.41 kg/m. Filed Weights   09/20/21 0954  Weight: 180 lb (81.6 kg)     Objective:   Physical Exam Neck:     Vascular: No JVD.  Cardiovascular:     Rate and Rhythm: Normal rate and regular rhythm.     Pulses:          Dorsalis pedis pulses are 0 on the right side.       Posterior tibial pulses are 0 on the right side.     Heart sounds: No murmur heard. Musculoskeletal:     Right lower leg: No edema.     Comments: S/p left AKA Now has prosthesis          Assessment & Recommendations:   60 y.o. Caucasian male with CAD, culprit (RCA) and nonculprit (LCx) PCI after STEMI 07/2020, HFrEF, moderate MR, h/o cardiac arrest 2/2 Torsades post PCI during index hospitalization, PAD with critical limb ischemia s/p Rt iliac revascularization, and s/p Lt AKA amputation for critical limb ischemia,  severe bilateral renal artery stenoses w/h/o CIN (09/2020), stroke s/p tPA with resolution of neurodeficit (11/2020), multifocal stroke on MRI suspicious for cardioembolic source, small PFO/secundum ASD and large pulmonary AV fistula now s/p closure by Dr. Kathlene Cote (02/2021), ILR placement (04/2021), asymptomatic isnus pauses  Asymptomatic sinus  pauses: Likely due to OSA. He still wants to hold off sleep testing.  Stroke: Multifocal stroke with small PFO/secundum ASD but large pulmonary AV fistula (11/2020), now s/p closure by Dr. Kathlene Cote (02/2021) Repeat TCD bubble study as pr Dr. Clydene Fake recommendations S/p ILR placement (04/2021). No Afib detected thus far  PAD: Successful revascularization of right common iliac and right  external iliac artery (09/2020) S/p Lt AKA.  No critical limb ischemia, although he has pre-ulcerative calloses on right sole.  On plavix 75 mg and Xarelto 2.5 mg bid. Will change to Aspirin 81 mg daily and Xarelto 2.5 mg bid.  HFrEF, secondary mitral regurgitation: Euvolumic. EF 40-45% (8.2022). No ICD indicated. Continue Entresto 24-26 mg bid, metoprolol succinate 25 mg daily.  CAD: S/p STEMI 07/2020. Currently no angina symptoms On plavix 75 mg and Xarelto 2.5 mg bid. Will change to Aspirin 81 mg daily and Xarelto 2.5 mg bid. Continue Crestor 40 mgLDL remains >70, Added Zetia 10 mg daily Repeat lipid panel 3 months  F/u in 3 months    Nigel Mormon, MD Pager: [phone] Office: [phone]

## 2021-09-23 ENCOUNTER — Encounter: Payer: BC Managed Care – PPO | Admitting: Physical Therapy

## 2021-09-25 ENCOUNTER — Encounter: Payer: BC Managed Care – PPO | Admitting: Physical Therapy

## 2021-09-26 ENCOUNTER — Telehealth: Payer: Self-pay | Admitting: Cardiology

## 2021-09-26 NOTE — Telephone Encounter (Signed)
Lvm for pt call back.

## 2021-09-26 NOTE — Telephone Encounter (Signed)
Patient having issue getting refill for xarelto; says pharmacy told him they're waiting on [place] to complete something on our end. [phone].

## 2021-09-30 ENCOUNTER — Encounter: Payer: BC Managed Care – PPO | Admitting: Physical Therapy

## 2021-10-02 ENCOUNTER — Encounter: Payer: BC Managed Care – PPO | Admitting: Physical Therapy

## 2021-10-03 ENCOUNTER — Telehealth: Payer: Self-pay | Admitting: Orthopedic Surgery

## 2021-10-03 NOTE — Telephone Encounter (Signed)
[employer] forms received. Per Dr. Jess Barters medical assistant, unable to fill out as patient last seen 04/17/21, needs appointment. This was faxed to [facility].

## 2021-10-07 ENCOUNTER — Other Ambulatory Visit: Payer: Self-pay

## 2021-10-07 ENCOUNTER — Telehealth: Payer: Self-pay | Admitting: Physical Therapy

## 2021-10-07 ENCOUNTER — Encounter: Payer: Self-pay | Admitting: Physical Therapy

## 2021-10-07 ENCOUNTER — Ambulatory Visit (INDEPENDENT_AMBULATORY_CARE_PROVIDER_SITE_OTHER): Payer: BC Managed Care – PPO | Admitting: Physical Therapy

## 2021-10-07 DIAGNOSIS — R2689 Other abnormalities of gait and mobility: Secondary | ICD-10-CM | POA: Diagnosis not present

## 2021-10-07 DIAGNOSIS — R293 Abnormal posture: Secondary | ICD-10-CM | POA: Diagnosis not present

## 2021-10-07 DIAGNOSIS — M25652 Stiffness of left hip, not elsewhere classified: Secondary | ICD-10-CM

## 2021-10-07 DIAGNOSIS — R2681 Unsteadiness on feet: Secondary | ICD-10-CM | POA: Diagnosis not present

## 2021-10-07 DIAGNOSIS — I739 Peripheral vascular disease, unspecified: Secondary | ICD-10-CM

## 2021-10-07 DIAGNOSIS — M6281 Muscle weakness (generalized): Secondary | ICD-10-CM | POA: Diagnosis not present

## 2021-10-07 DIAGNOSIS — I2510 Atherosclerotic heart disease of native coronary artery without angina pectoris: Secondary | ICD-10-CM

## 2021-10-07 MED ORDER — ROSUVASTATIN CALCIUM 40 MG PO TABS: 40.0000 mg | ORAL_TABLET | Freq: Every evening | ORAL | 3 refills | Status: DC

## 2021-10-07 NOTE — Therapy (Signed)
[facility] Physical Therapy [address] [place], [place], [address] Phone: [phone]   Fax:  [phone]  Physical Therapy Treatment  Patient Details  Name: Mike [name] MRN: [id number] Date of Birth: 1961/10/04 Referring Provider (PT): Meridee Score, MD   Encounter Date: 10/07/2021   PT End of Session - 10/07/21 1053     Visit Number 19    Number of Visits 32    Date for PT Re-Evaluation 12/26/21    Authorization Type BCBS    Authorization Time Period OOP & Deductible MET    PT Start Time 1055    PT Stop Time 3846    PT Time Calculation (min) 50 min    Equipment Utilized During Treatment Gait belt    Activity Tolerance Patient tolerated treatment well    Behavior During Therapy [facility] for tasks assessed/performed             Past Medical History:  Diagnosis Date   Anxiety    CAD (coronary artery disease)    Cryptogenic stroke ([place]) 05/10/2021   Encounter for loop recorder check 07/26/2021   Heart attack ([place]) 08/01/2020   Heart failure with reduced ejection fraction ([place])    Hyperlipidemia    Hypertension    Loop [employer] [id number] 05/10/2021 05/10/2021   PAD (peripheral artery disease) ([place])    right CIA & right distal iliac artery stents 10/16/20; left AKA 11/06/20   PFO (patent foramen ovale) 12/14/2020   Pneumonia    Pulmonary AV (arteriovenous) fistula ([place]) 12/14/2020   Stroke ([place]) 12/11/2020   no residuial effects    Past Surgical History:  Procedure Laterality Date   ABDOMINAL AORTOGRAM W/LOWER EXTREMITY N/A 10/16/2020   Procedure: ABDOMINAL AORTOGRAM W/LOWER EXTREMITY;  Surgeon: Nigel Mormon, MD;  Location: [place] [facility];  Service: Cardiovascular;  Laterality: N/A;   AMPUTATION Left 11/06/2020   Procedure: AMPUTATION ABOVE KNEE;  Surgeon: Rosetta Posner, MD;  Location: [place];  Service: Vascular;  Laterality: Left;   BUBBLE STUDY  12/14/2020   Procedure: BUBBLE STUDY;  Surgeon: Nigel Mormon, MD;  Location: [place];  Service: Cardiovascular;;   CARDIAC CATHETERIZATION     CORONARY STENT INTERVENTION N/A 08/03/2020   Procedure: CORONARY STENT INTERVENTION;  Surgeon: Nigel Mormon, MD;  Location: [place] [facility];  Service: Cardiovascular;  Laterality: N/A;   CORONARY/GRAFT ACUTE MI REVASCULARIZATION N/A 08/01/2020   Procedure: Coronary/Graft Acute MI Revascularization;  Surgeon: Nigel Mormon, MD;  Location: [place] [facility];  Service: Cardiovascular;  Laterality: N/A;   INTRAVASCULAR LITHOTRIPSY Right 10/16/2020   Procedure: INTRAVASCULAR LITHOTRIPSY;  Surgeon: Nigel Mormon, MD;  Location: [place] [facility];  Service: Cardiovascular;  Laterality: Right;  Common and External Iliac   IR ANGIOGRAM PULMONARY LEFT SELECTIVE  03/20/2021   IR ANGIOGRAM SELECTIVE EACH ADDITIONAL VESSEL  03/20/2021   IR EMBO ARTERIAL NOT HEMORR HEMANG INC GUIDE ROADMAPPING  03/20/2021   IR RADIOLOGIST EVAL & MGMT  01/30/2021   IR RADIOLOGIST EVAL & MGMT  04/18/2021   IR RADIOLOGIST EVAL & MGMT  07/25/2021   IR US GUIDE VASC ACCESS RIGHT  03/20/2021   LEFT HEART CATH AND CORONARY ANGIOGRAPHY N/A 08/01/2020   Procedure: LEFT HEART CATH AND CORONARY ANGIOGRAPHY;  Surgeon: Nigel Mormon, MD;  Location: [place] [facility];  Service: Cardiovascular;  Laterality: N/A;   LEFT HEART CATH AND CORONARY ANGIOGRAPHY N/A 08/03/2020   Procedure: LEFT HEART CATH AND CORONARY ANGIOGRAPHY;  Surgeon: Nigel Mormon, MD;  Location: [place] [facility];  Service: Cardiovascular;  Laterality: N/A;   PERIPHERAL VASCULAR INTERVENTION Right 10/16/2020   Procedure: PERIPHERAL VASCULAR INTERVENTION;  Surgeon: Nigel Mormon, MD;  Location: [place] [facility];  Service: Cardiovascular;  Laterality: Right;  Common and Iliac Stent   RADIOLOGY WITH ANESTHESIA N/A 03/20/2021   Procedure: PULMONARY EMBOLIZATION;  Surgeon: Aletta Edouard, MD;  Location: [place];  Service: Radiology;  Laterality: N/A;   TEE WITHOUT  CARDIOVERSION N/A 12/14/2020   Procedure: TRANSESOPHAGEAL ECHOCARDIOGRAM (TEE);  Surgeon: Nigel Mormon, MD;  Location: [facility];  Service: Cardiovascular;  Laterality: N/A;    There were no vitals filed for this visit.   Subjective Assessment - 10/07/21 1054     Subjective He has been having phantom pain constantly night & day. It feels like squeezing pain that wraps around shin.  The residual limb hurts at bottom with prosthesis on or off.    Pertinent History PAD, gangrene s/p L AKA 11/06/20, HTN, CAD s/p STEMI with stenting 08/17/2020, and CHF. cryptogenic stroke Dec 2021    Patient Stated Goals to return to work, (he was released from current job over last weekend but wants to find job).  to walk in community,    Currently in Pain? Yes    Pain Score 7    lowest 3-4/10 and highest 10/10   Pain Location Leg    Pain Orientation Left    Pain Descriptors / Indicators Squeezing    Pain Type Phantom pain    Pain Onset More than a month ago    Pain Frequency Constant    Aggravating Factors  wearing prosthesis more especially if standing & walking,  morning is worse.    Pain Relieving Factors medication, massage, Gabapentin 7am, 2pm, 930pm    Multiple Pain Sites Yes    Pain Score 6   lowest 3-4/10 and highest 10/10   Pain Location Leg   residual limb   Pain Orientation Left;Distal    Pain Descriptors / Indicators Throbbing;Spasm    Pain Type Chronic pain    Pain Onset More than a month ago    Pain Frequency Constant    Aggravating Factors  overuse but does not know    Pain Relieving Factors medication tylenol & Gabapentin, warm shower,                 Prosthetics Assessment - 10/07/21 1055       Prosthetics   Current prosthetic wear tolerance (#hours/day)  6-10 hours, not consistent per pt rpeort due to pain    Residual limb condition  circumferential proximal 48.7cm and distaly 40 cm                          [facility] Adult PT Treatment/Exercise -  10/07/21 1055       Transfers   Transfers Sit to Stand;Stand to Sit    Sit to Stand 5: Supervision;With upper extremity assist;From chair/3-in-1    Sit to Stand Details Verbal cues for technique;Verbal cues for safe use of DME/AE    Stand to Sit 5: Supervision;With upper extremity assist;To chair/3-in-1    Stand to Sit Details (indicate cue type and reason) Verbal cues for safe use of DME/AE;Verbal cues for technique      Ambulation/Gait   Ambulation/Gait Yes    Ambulation/Gait Assistance 5: Supervision    Ambulation/Gait Assistance Details verbal cues on upright posture & tactile cues for balance reactions  Ambulation Distance (Feet) 100 Feet   100 x 2 reps   Assistive device Lofstrands;Prosthesis;R Forearm Crutch   enter / exit with 2 forearm crutches   Gait Pattern Step-through pattern;Decreased step length - right;Decreased stance time - left;Decreased hip/knee flexion - left;Decreased weight shift to left;Left hip hike;Left flexed knee in stance;Antalgic;Lateral hip instability;Trunk flexed;Abducted - left;Poor foot clearance - left    Ambulation Surface Level;Indoor    Stairs Yes    Stairs Assistance 5: Supervision    Stairs Assistance Details (indicate cue type and reason) alternating pattern descending with verbal & tactile cues on prosthetic knee control & wt shift.    Stair Management Technique Two rails;Alternating pattern;Forwards    Number of Stairs 11    Height of Stairs 6      Self-Care   Self-Care Other Self-Care Comments    Other Self-Care Comments  mirror therapy sitting watching RLE move in mirror - LAQs, ankle pumps, rolling tennis ball.  PT also discuss creating pants to sleep that light compression & warmth.  Pt verbalized understanding.      Therapeutic Activites    Therapeutic Activities Lifting      Prosthetics   Prosthetic Care Comments  Continue to encourage pt to increase wear time when out of bed.    Current prosthetic wear tolerance (days/week)  daily     Current prosthetic weight-bearing tolerance (hours/day)  Patient tolerates standing for 8 min with no limb pain. Gait >6 min results in vascular pain in RLE.    Education Provided Proper wear schedule/adjustment;Correct ply sock adjustment;Other (comment)    Person(s) Educated Patient    Education Method Explanation;Verbal cues    Education Method Verbalized understanding;Verbal cues required;Needs further instruction                       PT Short Term Goals - 10/07/21 1258       PT SHORT TERM GOAL #1   Title Patient reports wearing prosthesis >12 hours 7 days /wk.    Time 1    Period Months    Status On-going    Target Date 11/07/21      PT SHORT TERM GOAL #2   Title Patient ambulates 150' with single forearm crutch & prosthesis with supervision.    Time 1    Period Months    Status Revised    Target Date 11/07/21      PT SHORT TERM GOAL #3   Title Patient able to pick up 5# from floor without UE support safely.    Time 1    Period Months    Status On-going    Target Date 11/07/21      PT SHORT TERM GOAL #4   Title Patient descends stairs with 1 rail & 1 crutch using step over step pattern with supervision.    Time 1    Period Months    Status Revised    Target Date 11/07/21               PT Long Term Goals - 08/07/21 1537       PT LONG TERM GOAL #1   Title Patient demonstrates & verbalized understanding of prosthetic care to enable safe utilization of prosthesis and tolerates wear >90% of awake hours without skin issues or limb pain.    Time 20    Period Weeks    Status Revised    Target Date 12/26/21      PT LONG TERM GOAL #  2   Title Timed Up & Go standard with prosthesis only < 20sec and cognitive TUG with prosthesis only < 30 sec.    Time 20    Period Weeks    Status New    Target Date 12/26/21      PT LONG TERM GOAL #3   Title Berg Balance >/= 45/56 to indicate lower fall risk    Time 10    Period Weeks    Status On-going     Target Date 12/26/21      PT LONG TERM GOAL #4   Title Patient ambulates 500' with cane or single forearm crutch & prosthesis modified independent in 6 minutes or less.    Time 20    Period Weeks    Status Revised    Target Date 12/26/21      PT LONG TERM GOAL #5   Title Patient negotiates ramps, curbs & stairs single rail with cane or single forearm crutch & prosthesis modified independent.    Time 20    Period Weeks    Status Revised    Target Date 12/26/21      PT LONG TERM GOAL #6   Title Gait velocity with single forearm crutch or cane and prosthesis >/= 1.90 ft/sec comfortable pace & >/= 2.40 ft/sec fast pace.    Time 20    Period Weeks    Status New    Target Date 12/26/21                   Plan - 10/07/21 1054     Clinical Impression Statement Patient has been limited in mobility, prosthesis wear and attendance at PT due to severe phantom pain.  He did run out of Gabapentin for 10 days which is one factor related to phantom pain.  PT educated pt on common triggers to phantom pain and some techniques to manage including mirror therapy and compression/warmth at night with shrinker & modifying pajama pants. He appears to understand.    Personal Factors and Comorbidities Comorbidity 3+;Fitness;Time since onset of injury/illness/exacerbation    Comorbidities PAD, gangrene s/p L AKA 11/06/20, HTN, CAD s/p STEMI with stenting 08/17/2020, and CHF. cryptogenic stroke Dec 2021    Examination-Activity Limitations Lift;Locomotion Level;Squat;Stairs;Stand;Transfers    Examination-Participation Restrictions Community Activity;Occupation    Stability/Clinical Decision Making Evolving/Moderate complexity    Rehab Potential Good    PT Frequency 2x / week    PT Duration Other (comment)   20 weeks   PT Treatment/Interventions ADLs/Self Care Home Management;DME Instruction;Gait training;Stair training;Functional mobility training;Therapeutic activities;Therapeutic exercise;Balance  training;Neuromuscular re-education;Patient/family education;Prosthetic Training;Manual techniques;Passive range of motion    PT Next Visit Plan work towards updated STGs, work on prosthetic gait with single crutch. stairs alternating pattern with 2 rails.  therapuetic exercise including leg press, treadmill & standing exercises.    Consulted and Agree with Plan of Care Patient             Patient will benefit from skilled therapeutic intervention in order to improve the following deficits and impairments:  Abnormal gait, Decreased activity tolerance, Decreased balance, Decreased endurance, Decreased knowledge of use of DME, Decreased mobility, Decreased range of motion, Decreased strength, Increased edema, Postural dysfunction, Prosthetic Dependency, Pain  Visit Diagnosis: Unsteadiness on feet  Other abnormalities of gait and mobility  Muscle weakness (generalized)  Abnormal posture  Stiffness of left hip, not elsewhere classified     Problem List Patient Active Problem List   Diagnosis Date Noted   Encounter  for loop recorder check 07/26/2021   Cryptogenic stroke ([place]) 05/10/2021   Loop [employer] [id number] 05/10/2021 05/10/2021   AVM (arteriovenous malformation) 03/20/2021   Pulmonary AV (arteriovenous) fistula ([place]) 02/20/2021   Pre-ulcerative calluses 12/20/2020   Cerebral thrombosis with cerebral infarction 12/11/2020   Cerebrovascular accident (CVA) due to embolism of cerebral artery ([place]) 12/11/2020   Pressure injury of skin 11/12/2020   Acute urinary retention 11/09/2020   Normal anion gap metabolic acidosis 36/14/4315   Hypotension 11/07/2020   Chest pain 10/31/2020   Acute on chronic HFrEF (heart failure with reduced ejection fraction) ([place])    Goals of care, counseling/discussion    Palliative care by specialist    DNR (do not resuscitate)    Critical limb ischemia with history of revascularization of same extremity ([place]) 10/30/2020   Hyperglycemia 10/30/2020    Tobacco abuse 10/30/2020   Leukocytosis 10/30/2020   Chronic combined systolic and diastolic CHF (congestive heart failure) ([place]) 10/30/2020   Ischemic cardiomyopathy 10/24/2020   AKI (acute kidney injury) ([place]) 10/17/2020   PAD (peripheral artery disease) ([place]) 10/16/2020   Critical lower limb ischemia ([place]) 10/05/2020   Rest pain of left upper extremity due to atherosclerosis ([place]) 10/05/2020   Severe claudication ([place]) 09/20/2020   Paresthesia of both lower extremities 09/20/2020   H/O cardiac arrest 09/20/2020   H/O acute myocardial infarction 08/14/2020   Snoring 08/14/2020   Sinus pause 08/14/2020   Mixed hyperlipidemia 08/14/2020   Tobacco dependence 08/14/2020   Coronary artery disease involving native coronary artery of native heart without angina pectoris 08/10/2020   HFrEF (heart failure with reduced ejection fraction) ([place]) 08/10/2020   Nonrheumatic mitral valve regurgitation 08/10/2020   STEMI (ST elevation myocardial infarction) ([place]) 08/02/2020   Accelerated hypertension 08/01/2020    Jamey Reas, PT, DPT 10/07/2021, 1:03 PM  [facility] Physical Therapy [address] [place], [place], [address] Phone: [phone]   Fax:  [phone]  Name: Mike [name] MRN: [id number] Date of Birth: [birth year]

## 2021-10-07 NOTE — Telephone Encounter (Signed)
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  He reports that his shrinker is too big.  I advised to wear shrinker at night as compression & warmth can help phantom pain. Can you write prescription for new shrinkers & FAX to [facility] attn Brooke Pace?  Thank you  Shirlean Mylar

## 2021-10-07 NOTE — Telephone Encounter (Signed)
Order faxed to [facility] and pt is aware.

## 2021-10-09 ENCOUNTER — Ambulatory Visit (INDEPENDENT_AMBULATORY_CARE_PROVIDER_SITE_OTHER): Payer: BC Managed Care – PPO | Admitting: Physical Therapy

## 2021-10-09 ENCOUNTER — Other Ambulatory Visit: Payer: Self-pay

## 2021-10-09 ENCOUNTER — Encounter: Payer: Self-pay | Admitting: Physical Therapy

## 2021-10-09 DIAGNOSIS — R2689 Other abnormalities of gait and mobility: Secondary | ICD-10-CM

## 2021-10-09 DIAGNOSIS — R293 Abnormal posture: Secondary | ICD-10-CM | POA: Diagnosis not present

## 2021-10-09 DIAGNOSIS — M6281 Muscle weakness (generalized): Secondary | ICD-10-CM | POA: Diagnosis not present

## 2021-10-09 DIAGNOSIS — M25652 Stiffness of left hip, not elsewhere classified: Secondary | ICD-10-CM

## 2021-10-09 DIAGNOSIS — R2681 Unsteadiness on feet: Secondary | ICD-10-CM | POA: Diagnosis not present

## 2021-10-09 NOTE — Therapy (Signed)
[facility] Physical Therapy [address] [place], [place], [address] Phone: [phone]   Fax:  [phone]  Physical Therapy Treatment  Patient Details  Name: Mike [name] MRN: [id number] Date of Birth: Mar 09, 1961 Referring Provider (PT): Meridee Score, MD   Encounter Date: 10/09/2021   PT End of Session - 10/09/21 1043     Visit Number 20    Number of Visits 40    Date for PT Re-Evaluation 12/26/21    Authorization Type BCBS    Authorization Time Period OOP & Deductible MET    PT Start Time 6803    PT Stop Time 1130    PT Time Calculation (min) 47 min    Equipment Utilized During Treatment Gait belt    Activity Tolerance Patient tolerated treatment well    Behavior During Therapy [facility] for tasks assessed/performed             Past Medical History:  Diagnosis Date   Anxiety    CAD (coronary artery disease)    Cryptogenic stroke ([place]) 05/10/2021   Encounter for loop recorder check 07/26/2021   Heart attack ([place]) 08/01/2020   Heart failure with reduced ejection fraction ([place])    Hyperlipidemia    Hypertension    Loop Biotronic [id number] 05/10/2021 05/10/2021   PAD (peripheral artery disease) ([place])    right CIA & right distal iliac artery stents 10/16/20; left AKA 11/06/20   PFO (patent foramen ovale) 12/14/2020   Pneumonia    Pulmonary AV (arteriovenous) fistula ([place]) 12/14/2020   Stroke ([place]) 12/11/2020   no residuial effects    Past Surgical History:  Procedure Laterality Date   ABDOMINAL AORTOGRAM W/LOWER EXTREMITY N/A 10/16/2020   Procedure: ABDOMINAL AORTOGRAM W/LOWER EXTREMITY;  Surgeon: Nigel Mormon, MD;  Location: [place] [facility];  Service: Cardiovascular;  Laterality: N/A;   AMPUTATION Left 11/06/2020   Procedure: AMPUTATION ABOVE KNEE;  Surgeon: Rosetta Posner, MD;  Location: [place];  Service: Vascular;  Laterality: Left;   BUBBLE STUDY  12/14/2020   Procedure: BUBBLE STUDY;  Surgeon: Nigel Mormon, MD;  Location: [place];  Service: Cardiovascular;;   CARDIAC CATHETERIZATION     CORONARY STENT INTERVENTION N/A 08/03/2020   Procedure: CORONARY STENT INTERVENTION;  Surgeon: Nigel Mormon, MD;  Location: [place] [facility];  Service: Cardiovascular;  Laterality: N/A;   CORONARY/GRAFT ACUTE MI REVASCULARIZATION N/A 08/01/2020   Procedure: Coronary/Graft Acute MI Revascularization;  Surgeon: Nigel Mormon, MD;  Location: [place] [facility];  Service: Cardiovascular;  Laterality: N/A;   INTRAVASCULAR LITHOTRIPSY Right 10/16/2020   Procedure: INTRAVASCULAR LITHOTRIPSY;  Surgeon: Nigel Mormon, MD;  Location: [place] [facility];  Service: Cardiovascular;  Laterality: Right;  Common and External Iliac   IR ANGIOGRAM PULMONARY LEFT SELECTIVE  03/20/2021   IR ANGIOGRAM SELECTIVE EACH ADDITIONAL VESSEL  03/20/2021   IR EMBO ARTERIAL NOT HEMORR HEMANG INC GUIDE ROADMAPPING  03/20/2021   IR RADIOLOGIST EVAL & MGMT  01/30/2021   IR RADIOLOGIST EVAL & MGMT  04/18/2021   IR RADIOLOGIST EVAL & MGMT  07/25/2021   IR US GUIDE VASC ACCESS RIGHT  03/20/2021   LEFT HEART CATH AND CORONARY ANGIOGRAPHY N/A 08/01/2020   Procedure: LEFT HEART CATH AND CORONARY ANGIOGRAPHY;  Surgeon: Nigel Mormon, MD;  Location: [place] [facility];  Service: Cardiovascular;  Laterality: N/A;   LEFT HEART CATH AND CORONARY ANGIOGRAPHY N/A 08/03/2020   Procedure: LEFT HEART CATH AND CORONARY ANGIOGRAPHY;  Surgeon: Nigel Mormon, MD;  Location: [place] [facility];  Service: Cardiovascular;  Laterality: N/A;   PERIPHERAL VASCULAR INTERVENTION Right 10/16/2020   Procedure: PERIPHERAL VASCULAR INTERVENTION;  Surgeon: Nigel Mormon, MD;  Location: [place] [facility];  Service: Cardiovascular;  Laterality: Right;  Common and Iliac Stent   RADIOLOGY WITH ANESTHESIA N/A 03/20/2021   Procedure: PULMONARY EMBOLIZATION;  Surgeon: Aletta Edouard, MD;  Location: [place];  Service: Radiology;  Laterality: N/A;   TEE WITHOUT  CARDIOVERSION N/A 12/14/2020   Procedure: TRANSESOPHAGEAL ECHOCARDIOGRAM (TEE);  Surgeon: Nigel Mormon, MD;  Location: [facility] END[facility];  Service: Cardiovascular;  Laterality: N/A;    There were no vitals filed for this visit.   Subjective Assessment - 10/09/21 1043     Subjective He went to [facility] prior to PT and realized that they gave him shrinker for BKA so going back.    Pertinent History PAD, gangrene s/p L AKA 11/06/20, HTN, CAD s/p STEMI with stenting 08/17/2020, and CHF. cryptogenic stroke Dec 2021    Patient Stated Goals to return to work, (he was released from current job over last weekend but wants to find job).  to walk in community,    Currently in Pain? Yes    Pain Score 4     Pain Location Leg   phantom   Pain Orientation Left    Pain Descriptors / Indicators Squeezing    Pain Type Phantom pain    Pain Onset More than a month ago    Pain Frequency Constant    Aggravating Factors  unknown    Pain Relieving Factors Gabapentin, warm wash cloth helped a little bit, hot shower    Pain Score 7    Pain Location Leg   residual limb   Pain Orientation Left;Distal    Pain Descriptors / Indicators Sore;Spasm    Pain Type Chronic pain    Pain Onset More than a month ago    Pain Frequency Intermittent    Aggravating Factors  overuse but does not know    Pain Relieving Factors Gabapentin & tylenol, warm shower                               [facility] Adult PT Treatment/Exercise - 10/09/21 1045       Transfers   Transfers Sit to Stand;Stand to Sit    Sit to Stand 5: Supervision;With upper extremity assist    Five time sit to stand comments  verbal & demo cues to incorporate microprocessor knee and not require external support for stabilization    Stand to Sit 5: Supervision;With upper extremity assist;To chair/3-in-1    Stand to Sit Details verbal & demo cues to incorporate microprocessor knee and not require external support for stabilization     Number of Reps Other reps (comment)   5 reps     Ambulation/Gait   Ambulation/Gait Yes    Ambulation/Gait Assistance 5: Supervision    Ambulation/Gait Assistance Details verbal, tactile & demo cues on step length and weight shift over prosthesis laterally & anteriorly in stance    Ambulation Distance (Feet) 120 Feet    Assistive device R Forearm Crutch;Prosthesis    Ambulation Surface Level;Indoor    Stairs Yes    Stairs Assistance 5: Supervision    Stairs Assistance Details (indicate cue type and reason) verbal cues to engage prosthesis descending & wt shift over prosthesis engaging hip extensors / stabilization ascending    Stair Management Technique  Two rails;Alternating pattern;Forwards;Step to pattern   alt descending & step-to ascend   Number of Stairs 11    Height of Stairs 6    Ramp 4: Min assist   right forearm crutch & TFA microprocessor prosthesis   Ramp Details (indicate cue type and reason) demo, verbal & tactile cues on technique    Curb 4: Min assist   min guard right forearm crutch & TFA microprocessor prosthesis   Curb Details (indicate cue type and reason) demo, verbal & tactile cues on technique    Pre-Gait Activities at counter & contralateral single crutch working on equal step length with heel passing stance toes ~2" (slow gait pattern) equal LEs and weight shift laterally & anteriorly over prosthesis.    Gait Comments facing counter in staggered stance weight shift engaging left hip extensors in shift & reaching RUE overhead to cabinet      Knee/Hip Exercises: Stretches   Active Hamstring Stretch Right;3 reps;30 seconds    Active Hamstring Stretch Limitations seated with RLE extended on bed w/strap dorsiflexion    Hip Flexor Stretch Left;3 reps;30 seconds    Hip Flexor Stretch Limitations supine dropping prosthesis / LLE over edge rep 1&2 hooklying and 3 knee to chest      Prosthetics   Prosthetic Care Comments  PT recommended shower just before bed as it decreases  phantom & residual limb pain.  Residual limb pain may be limb seated too deep in socket and may need to increase ply socks.  PT recommended having prosthetist measure his limb prior to getting new shrinker or liner as his limb may be smaller requiring smaller size.    Current prosthetic wear tolerance (days/week)  daily    Current prosthetic wear tolerance (#hours/day)  6-10 hours, not consistent per pt rpeort due to pain    Current prosthetic weight-bearing tolerance (hours/day)  Patient tolerates standing for 8 min until limb pain to level needs seated rest    Education Provided Correct ply sock adjustment;Proper wear schedule/adjustment;Other (comment)   see prosthetic care comments   Person(s) Educated Patient    Education Method Explanation;Verbal cues    Education Method Verbalized understanding;Verbal cues required;Needs further instruction    Donning Prosthesis Modified independent (device/increased time)                       PT Short Term Goals - 10/07/21 1258       PT SHORT TERM GOAL #1   Title Patient reports wearing prosthesis >12 hours 7 days /wk.    Time 1    Period Months    Status On-going    Target Date 11/07/21      PT SHORT TERM GOAL #2   Title Patient ambulates 150' with single forearm crutch & prosthesis with supervision.    Time 1    Period Months    Status Revised    Target Date 11/07/21      PT SHORT TERM GOAL #3   Title Patient able to pick up 5# from floor without UE support safely.    Time 1    Period Months    Status On-going    Target Date 11/07/21      PT SHORT TERM GOAL #4   Title Patient descends stairs with 1 rail & 1 crutch using step over step pattern with supervision.    Time 1    Period Months    Status Revised    Target Date 11/07/21  PT Long Term Goals - 08/07/21 1537       PT LONG TERM GOAL #1   Title Patient demonstrates & verbalized understanding of prosthetic care to enable safe utilization of  prosthesis and tolerates wear >90% of awake hours without skin issues or limb pain.    Time 20    Period Weeks    Status Revised    Target Date 12/26/21      PT LONG TERM GOAL #2   Title Timed Up & Go standard with prosthesis only < 20sec and cognitive TUG with prosthesis only < 30 sec.    Time 20    Period Weeks    Status New    Target Date 12/26/21      PT LONG TERM GOAL #3   Title Berg Balance >/= 45/56 to indicate lower fall risk    Time 10    Period Weeks    Status On-going    Target Date 12/26/21      PT LONG TERM GOAL #4   Title Patient ambulates 500' with cane or single forearm crutch & prosthesis modified independent in 6 minutes or less.    Time 20    Period Weeks    Status Revised    Target Date 12/26/21      PT LONG TERM GOAL #5   Title Patient negotiates ramps, curbs & stairs single rail with cane or single forearm crutch & prosthesis modified independent.    Time 20    Period Weeks    Status Revised    Target Date 12/26/21      PT LONG TERM GOAL #6   Title Gait velocity with single forearm crutch or cane and prosthesis >/= 1.90 ft/sec comfortable pace & >/= 2.40 ft/sec fast pace.    Time 20    Period Weeks    Status New    Target Date 12/26/21                   Plan - 10/09/21 1044     Clinical Impression Statement PT instructed in HEP to address flexibility and weight shift which appears to understand.  PT worked on prosthetic gait with single crutch including ramps & curbs which he improved with instruction.  Pt continues to be limited by residual limb pain for standing & gait.    Personal Factors and Comorbidities Comorbidity 3+;Fitness;Time since onset of injury/illness/exacerbation    Comorbidities PAD, gangrene s/p L AKA 11/06/20, HTN, CAD s/p STEMI with stenting 08/17/2020, and CHF. cryptogenic stroke Dec 2021    Examination-Activity Limitations Lift;Locomotion Level;Squat;Stairs;Stand;Transfers    Examination-Participation Restrictions  Community Activity;Occupation    Stability/Clinical Decision Making Evolving/Moderate complexity    Rehab Potential Good    PT Frequency 2x / week    PT Duration Other (comment)   20 weeks   PT Treatment/Interventions ADLs/Self Care Home Management;DME Instruction;Gait training;Stair training;Functional mobility training;Therapeutic activities;Therapeutic exercise;Balance training;Neuromuscular re-education;Patient/family education;Prosthetic Training;Manual techniques;Passive range of motion    PT Next Visit Plan continue with work towards updated STGs, prosthetic gait with single crutch. stairs alternating pattern with 1 rail & crutch.  therapuetic exercise including leg press, treadmill & standing exercises.    Consulted and Agree with Plan of Care Patient             Patient will benefit from skilled therapeutic intervention in order to improve the following deficits and impairments:  Abnormal gait, Decreased activity tolerance, Decreased balance, Decreased endurance, Decreased knowledge of use of DME, Decreased mobility,  Decreased range of motion, Decreased strength, Increased edema, Postural dysfunction, Prosthetic Dependency, Pain  Visit Diagnosis: Other abnormalities of gait and mobility  Unsteadiness on feet  Muscle weakness (generalized)  Abnormal posture  Stiffness of left hip, not elsewhere classified     Problem List Patient Active Problem List   Diagnosis Date Noted   Encounter for loop recorder check 07/26/2021   Cryptogenic stroke ([place]) 05/10/2021   Loop Biotronic [id number] 05/10/2021 05/10/2021   AVM (arteriovenous malformation) 03/20/2021   Pulmonary AV (arteriovenous) fistula ([place]) 02/20/2021   Pre-ulcerative calluses 12/20/2020   Cerebral thrombosis with cerebral infarction 12/11/2020   Cerebrovascular accident (CVA) due to embolism of cerebral artery ([place]) 12/11/2020   Pressure injury of skin 11/12/2020   Acute urinary retention 11/09/2020   Normal  anion gap metabolic acidosis 44/69/5072   Hypotension 11/07/2020   Chest pain 10/31/2020   Acute on chronic HFrEF (heart failure with reduced ejection fraction) ([place])    Goals of care, counseling/discussion    Palliative care by specialist    DNR (do not resuscitate)    Critical limb ischemia with history of revascularization of same extremity ([place]) 10/30/2020   Hyperglycemia 10/30/2020   Tobacco abuse 10/30/2020   Leukocytosis 10/30/2020   Chronic combined systolic and diastolic CHF (congestive heart failure) ([place]) 10/30/2020   Ischemic cardiomyopathy 10/24/2020   AKI (acute kidney injury) ([place]) 10/17/2020   PAD (peripheral artery disease) ([place]) 10/16/2020   Critical lower limb ischemia ([place]) 10/05/2020   Rest pain of left upper extremity due to atherosclerosis ([place]) 10/05/2020   Severe claudication ([place]) 09/20/2020   Paresthesia of both lower extremities 09/20/2020   H/O cardiac arrest 09/20/2020   H/O acute myocardial infarction 08/14/2020   Snoring 08/14/2020   Sinus pause 08/14/2020   Mixed hyperlipidemia 08/14/2020   Tobacco dependence 08/14/2020   Coronary artery disease involving native coronary artery of native heart without angina pectoris 08/10/2020   HFrEF (heart failure with reduced ejection fraction) ([place]) 08/10/2020   Nonrheumatic mitral valve regurgitation 08/10/2020   STEMI (ST elevation myocardial infarction) ([place]) 08/02/2020   Accelerated hypertension 08/01/2020    Jamey Reas, PT, DPT 10/09/2021, 11:52 AM  [facility] [address] [place], [place], [address] Phone: [phone]   Fax:  [phone]  Name: Mike [name] MRN: [id number] Date of Birth: 10-Jan-1961

## 2021-10-11 DIAGNOSIS — Z4509 Encounter for adjustment and management of other cardiac device: Secondary | ICD-10-CM | POA: Diagnosis not present

## 2021-10-11 DIAGNOSIS — Z95818 Presence of other cardiac implants and grafts: Secondary | ICD-10-CM | POA: Diagnosis not present

## 2021-10-11 DIAGNOSIS — I5020 Unspecified systolic (congestive) heart failure: Secondary | ICD-10-CM | POA: Diagnosis not present

## 2021-10-14 ENCOUNTER — Encounter: Payer: BC Managed Care – PPO | Admitting: Physical Therapy

## 2021-10-14 DIAGNOSIS — I5042 Chronic combined systolic (congestive) and diastolic (congestive) heart failure: Secondary | ICD-10-CM | POA: Diagnosis not present

## 2021-10-14 DIAGNOSIS — R531 Weakness: Secondary | ICD-10-CM | POA: Diagnosis not present

## 2021-10-14 DIAGNOSIS — I639 Cerebral infarction, unspecified: Secondary | ICD-10-CM | POA: Diagnosis not present

## 2021-10-16 ENCOUNTER — Encounter: Payer: BC Managed Care – PPO | Admitting: Physical Therapy

## 2021-10-17 ENCOUNTER — Ambulatory Visit (INDEPENDENT_AMBULATORY_CARE_PROVIDER_SITE_OTHER): Payer: BC Managed Care – PPO | Admitting: Orthopedic Surgery

## 2021-10-17 DIAGNOSIS — Z89612 Acquired absence of left leg above knee: Secondary | ICD-10-CM | POA: Diagnosis not present

## 2021-10-17 MED ORDER — DULOXETINE HCL 30 MG PO CPEP: 30.0000 mg | ORAL_CAPSULE | Freq: Every day | ORAL | 3 refills | Status: DC

## 2021-10-20 ENCOUNTER — Encounter: Payer: Self-pay | Admitting: Orthopedic Surgery

## 2021-10-20 NOTE — Progress Notes (Signed)
Office Visit Note   Patient: Mike [name]           Date of Birth: Jun 23, 1961           MRN: [id number] Visit Date: 10/17/2021              Requested by: No referring provider defined for this encounter. PCP: Ephriam Jenkins, PA (Inactive)  Chief Complaint  Patient presents with   Left Knee - Follow-up      HPI: Patient is a 60 year old gentleman who is status post a left above-the-knee amputation by Dr. Oneida Alar 1 year ago.  Patient states he has been having left thigh pain that ranges between 4/10 to 9/10.  Patient is taking Neurontin 300 mg 3 times a day.  Pain is primarily a spasm type contracture in his thigh.  Assessment & Plan: Visit Diagnoses:  1. Left above-knee amputee [facility])     Plan: We will provide a prescription for Cymbalta to see if we can quiet down the neuropathic pain.  Patient will need his therapy notes released to [facility].  Follow-Up Instructions: Return in about 4 weeks (around 11/14/2021).   Ortho Exam  Patient is alert, oriented, no adenopathy, well-dressed, normal affect, normal respiratory effort. Examination patient has a well fitted computer knee the residual limb is well-healed with no ulcer or cellulitis.  Imaging: No results found. No images are attached to the encounter.  Labs: Lab Results  Component Value Date   HGBA1C 4.7 (L) 12/12/2020   HGBA1C 7.1 (H) 10/31/2020   HGBA1C 5.8 (H) 08/02/2020   LABURIC 9.1 (H) 10/17/2020   REPTSTATUS 11/12/2020 FINAL 11/11/2020   CULT  11/11/2020    NO GROWTH Performed at [facility], [place] [address] [place], [place] [address]      Lab Results  Component Value Date   ALBUMIN 3.0 (L) 12/11/2020   ALBUMIN 1.8 (L) 11/15/2020   ALBUMIN 1.8 (L) 11/14/2020    Lab Results  Component Value Date   MG 1.7 11/11/2020   MG 1.8 11/10/2020   MG 2.4 10/31/2020   No results found for: VD25OH  No results found for: PREALBUMIN CBC EXTENDED Latest Ref Rng & Units 03/20/2021  12/14/2020 12/12/2020  WBC 4.0 - 10.5 K/uL 8.3 7.5 8.2  RBC 4.22 - 5.81 MIL/uL 5.01 3.92(L) 3.89(L)  HGB 13.0 - 17.0 g/dL 13.5 11.6(L) 11.0(L)  HCT 39.0 - 52.0 % 42.1 35.5(L) 36.3(L)  PLT 150 - 400 K/uL 212 202 214  NEUTROABS 1.7 - 7.7 K/uL - - -  LYMPHSABS 0.7 - 4.0 K/uL - - -     There is no height or weight on file to calculate BMI.  Orders:  No orders of the defined types were placed in this encounter.  Meds ordered this encounter  Medications   DULoxetine (CYMBALTA) 30 MG capsule    Sig: Take 1 capsule (30 mg total) by mouth daily. Qam    Dispense:  30 capsule    Refill:  3     Procedures: No procedures performed  Clinical Data: No additional findings.  ROS:  All other systems negative, except as noted in the HPI. Review of Systems  Objective: Vital Signs: There were no vitals taken for this visit.  Specialty Comments:  No specialty comments available.  PMFS History: Patient Active Problem List   Diagnosis Date Noted   Encounter for loop recorder check 07/26/2021   Cryptogenic stroke ([place]) 05/10/2021   Loop Biotronic [id number] 05/10/2021 05/10/2021  AVM (arteriovenous malformation) 03/20/2021   Pulmonary AV (arteriovenous) fistula ([place]) 02/20/2021   Pre-ulcerative calluses 12/20/2020   Cerebral thrombosis with cerebral infarction 12/11/2020   Cerebrovascular accident (CVA) due to embolism of cerebral artery ([place]) 12/11/2020   Pressure injury of skin 11/12/2020   Acute urinary retention 11/09/2020   Normal anion gap metabolic acidosis 44/81/8563   Hypotension 11/07/2020   Chest pain 10/31/2020   Acute on chronic HFrEF (heart failure with reduced ejection fraction) ([place])    Goals of care, counseling/discussion    Palliative care by specialist    DNR (do not resuscitate)    Critical limb ischemia with history of revascularization of same extremity ([place]) 10/30/2020   Hyperglycemia 10/30/2020   Tobacco abuse 10/30/2020   Leukocytosis 10/30/2020    Chronic combined systolic and diastolic CHF (congestive heart failure) ([place]) 10/30/2020   Ischemic cardiomyopathy 10/24/2020   AKI (acute kidney injury) ([place]) 10/17/2020   PAD (peripheral artery disease) ([place]) 10/16/2020   Critical lower limb ischemia ([place]) 10/05/2020   Rest pain of left upper extremity due to atherosclerosis ([place]) 10/05/2020   Severe claudication ([place]) 09/20/2020   Paresthesia of both lower extremities 09/20/2020   H/O cardiac arrest 09/20/2020   H/O acute myocardial infarction 08/14/2020   Snoring 08/14/2020   Sinus pause 08/14/2020   Mixed hyperlipidemia 08/14/2020   Tobacco dependence 08/14/2020   Coronary artery disease involving native coronary artery of native heart without angina pectoris 08/10/2020   HFrEF (heart failure with reduced ejection fraction) ([place]) 08/10/2020   Nonrheumatic mitral valve regurgitation 08/10/2020   STEMI (ST elevation myocardial infarction) ([place]) 08/02/2020   Accelerated hypertension 08/01/2020   Past Medical History:  Diagnosis Date   Anxiety    CAD (coronary artery disease)    Cryptogenic stroke ([place]) 05/10/2021   Encounter for loop recorder check 07/26/2021   Heart attack ([place]) 08/01/2020   Heart failure with reduced ejection fraction ([place])    Hyperlipidemia    Hypertension    Loop Biotronic [id number] 05/10/2021 05/10/2021   PAD (peripheral artery disease) ([place])    right CIA & right distal iliac artery stents 10/16/20; left AKA 11/06/20   PFO (patent foramen ovale) 12/14/2020   Pneumonia    Pulmonary AV (arteriovenous) fistula ([place]) 12/14/2020   Stroke ([place]) 12/11/2020   no residuial effects    Family History  Problem Relation Age of Onset   Heart disease Mother    Heart disease Father    Cancer Brother     Past Surgical History:  Procedure Laterality Date   ABDOMINAL AORTOGRAM W/LOWER EXTREMITY N/A 10/16/2020   Procedure: ABDOMINAL AORTOGRAM W/LOWER EXTREMITY;  Surgeon: Nigel Mormon, MD;  Location: [place]  CV LAB;  Service: Cardiovascular;  Laterality: N/A;   AMPUTATION Left 11/06/2020   Procedure: AMPUTATION ABOVE KNEE;  Surgeon: Rosetta Posner, MD;  Location: [place];  Service: Vascular;  Laterality: Left;   BUBBLE STUDY  12/14/2020   Procedure: BUBBLE STUDY;  Surgeon: Nigel Mormon, MD;  Location: [place];  Service: Cardiovascular;;   CARDIAC CATHETERIZATION     CORONARY STENT INTERVENTION N/A 08/03/2020   Procedure: CORONARY STENT INTERVENTION;  Surgeon: Nigel Mormon, MD;  Location: [place] [facility];  Service: Cardiovascular;  Laterality: N/A;   CORONARY/GRAFT ACUTE MI REVASCULARIZATION N/A 08/01/2020   Procedure: Coronary/Graft Acute MI Revascularization;  Surgeon: Nigel Mormon, MD;  Location: [place] [facility];  Service: Cardiovascular;  Laterality: N/A;   INTRAVASCULAR LITHOTRIPSY Right 10/16/2020   Procedure: INTRAVASCULAR LITHOTRIPSY;  Surgeon: Nigel Mormon, MD;  Location: [place] [facility];  Service: Cardiovascular;  Laterality: Right;  Common and External Iliac   IR ANGIOGRAM PULMONARY LEFT SELECTIVE  03/20/2021   IR ANGIOGRAM SELECTIVE EACH ADDITIONAL VESSEL  03/20/2021   IR EMBO ARTERIAL NOT HEMORR HEMANG INC GUIDE ROADMAPPING  03/20/2021   IR RADIOLOGIST EVAL & MGMT  01/30/2021   IR RADIOLOGIST EVAL & MGMT  04/18/2021   IR RADIOLOGIST EVAL & MGMT  07/25/2021   IR US GUIDE VASC ACCESS RIGHT  03/20/2021   LEFT HEART CATH AND CORONARY ANGIOGRAPHY N/A 08/01/2020   Procedure: LEFT HEART CATH AND CORONARY ANGIOGRAPHY;  Surgeon: Nigel Mormon, MD;  Location: [place] [facility];  Service: Cardiovascular;  Laterality: N/A;   LEFT HEART CATH AND CORONARY ANGIOGRAPHY N/A 08/03/2020   Procedure: LEFT HEART CATH AND CORONARY ANGIOGRAPHY;  Surgeon: Nigel Mormon, MD;  Location: [employer] CV LAB;  Service: Cardiovascular;  Laterality: N/A;   PERIPHERAL VASCULAR INTERVENTION Right 10/16/2020   Procedure: PERIPHERAL VASCULAR INTERVENTION;  Surgeon: Nigel Mormon, MD;  Location: [place] [facility];  Service: Cardiovascular;  Laterality: Right;  Common and Iliac Stent   RADIOLOGY WITH ANESTHESIA N/A 03/20/2021   Procedure: PULMONARY EMBOLIZATION;  Surgeon: Aletta Edouard, MD;  Location: [place];  Service: Radiology;  Laterality: N/A;   TEE WITHOUT CARDIOVERSION N/A 12/14/2020   Procedure: TRANSESOPHAGEAL ECHOCARDIOGRAM (TEE);  Surgeon: Nigel Mormon, MD;  Location: [facility] ENDOSCOPY;  Service: Cardiovascular;  Laterality: N/A;   Social History   Occupational History   Occupation: Disability  Tobacco Use   Smoking status: Some Days    Packs/day: 0.50    Years: 40.00    Pack years: 20.00    Types: Cigarettes   Smokeless tobacco: Never   Tobacco comments:    3 - 7 a day  Vaping Use   Vaping Use: Never used  Substance and Sexual Activity   Alcohol use: Yes    Comment: ocassional since MI    Drug use: Never   Sexual activity: Not on file

## 2021-10-21 ENCOUNTER — Encounter: Payer: BC Managed Care – PPO | Admitting: Physical Therapy

## 2021-10-21 ENCOUNTER — Other Ambulatory Visit: Payer: Self-pay

## 2021-10-21 DIAGNOSIS — I5020 Unspecified systolic (congestive) heart failure: Secondary | ICD-10-CM

## 2021-10-21 MED ORDER — ENTRESTO 49-51 MG PO TABS
1.0000 | ORAL_TABLET | Freq: Two times a day (BID) | ORAL | 2 refills | Status: DC
Start: 2021-10-21 — End: 2022-01-20

## 2021-10-23 ENCOUNTER — Encounter: Payer: BC Managed Care – PPO | Admitting: Physical Therapy

## 2021-10-23 ENCOUNTER — Telehealth: Payer: Self-pay | Admitting: Orthopedic Surgery

## 2021-10-23 NOTE — Telephone Encounter (Signed)
Physical Therapy notes 8/22-present faxed Carloyn Jaeger [phone] patients request as stated in 10/20 dictation

## 2021-10-25 ENCOUNTER — Telehealth: Payer: Self-pay | Admitting: Orthopedic Surgery

## 2021-10-25 ENCOUNTER — Other Ambulatory Visit: Payer: Self-pay

## 2021-10-25 MED ORDER — EZETIMIBE 10 MG PO TABS: 10.0000 mg | ORAL_TABLET | Freq: Every day | ORAL | 3 refills | Status: DC

## 2021-10-25 NOTE — Telephone Encounter (Signed)
Received call from patient stating [facility] did not receive records I faxed 10/26. He gave me a different fax #. [phone], records faxed to this number. I did advise patient the fax was a significant number of pages, and that may be a reason why not transmitting. Told him to check with [facility] in a few hours and call me back if we need to do something elso.

## 2021-10-28 ENCOUNTER — Encounter: Payer: BC Managed Care – PPO | Admitting: Physical Therapy

## 2021-10-30 ENCOUNTER — Encounter: Payer: BC Managed Care – PPO | Admitting: Physical Therapy

## 2021-11-11 DIAGNOSIS — I5020 Unspecified systolic (congestive) heart failure: Secondary | ICD-10-CM | POA: Diagnosis not present

## 2021-11-11 DIAGNOSIS — Z4509 Encounter for adjustment and management of other cardiac device: Secondary | ICD-10-CM | POA: Diagnosis not present

## 2021-11-11 DIAGNOSIS — Z95818 Presence of other cardiac implants and grafts: Secondary | ICD-10-CM | POA: Diagnosis not present

## 2021-11-13 ENCOUNTER — Other Ambulatory Visit: Payer: Self-pay | Admitting: Orthopedic Surgery

## 2021-11-14 ENCOUNTER — Ambulatory Visit (INDEPENDENT_AMBULATORY_CARE_PROVIDER_SITE_OTHER): Payer: BC Managed Care – PPO | Admitting: Orthopedic Surgery

## 2021-11-14 ENCOUNTER — Encounter: Payer: Self-pay | Admitting: Orthopedic Surgery

## 2021-11-14 DIAGNOSIS — I639 Cerebral infarction, unspecified: Secondary | ICD-10-CM | POA: Diagnosis not present

## 2021-11-14 DIAGNOSIS — Z89612 Acquired absence of left leg above knee: Secondary | ICD-10-CM | POA: Diagnosis not present

## 2021-11-14 DIAGNOSIS — R531 Weakness: Secondary | ICD-10-CM | POA: Diagnosis not present

## 2021-11-14 DIAGNOSIS — I5042 Chronic combined systolic (congestive) and diastolic (congestive) heart failure: Secondary | ICD-10-CM | POA: Diagnosis not present

## 2021-11-14 MED ORDER — DULOXETINE HCL 30 MG PO CPEP: 30.0000 mg | ORAL_CAPSULE | Freq: Every day | ORAL | 3 refills | Status: DC

## 2021-11-14 NOTE — Progress Notes (Signed)
Office Visit Note   Patient: Mike [name]           Date of Birth: [birth year]           MRN: [id number] Visit Date: 11/14/2021              Requested by: No referring provider defined for this encounter. PCP: Ephriam Jenkins, PA (Inactive)  Chief Complaint  Patient presents with   Left Leg - Pain    Neuropathic pain Hx Left AKA 1 year ago w/ Dr. Oneida Alar      HPI: Patient is a 60 year old gentleman status post left above-the-knee amputation about a year ago.  Patient was last seen with neuropathic pain and he was started on Cymbalta and he states that this had a significant improvement in his radicular pain.  Patient states he has missed physical therapy upstairs secondary to pain and would like to continue his therapy at this time.  Assessment & Plan: Visit Diagnoses:  1. Left above-knee amputee [facility])     Plan: A new prescription for Cymbalta was called and a new prescription for physical therapy.  Follow-Up Instructions: Return if symptoms worsen or fail to improve.   Ortho Exam  Patient is alert, oriented, no adenopathy, well-dressed, normal affect, normal respiratory effort. Examination patient ambulates with bilateral [place] crutches he has an above-the-knee prosthesis on the left no skin or wound breakdown.  Imaging: No results found. No images are attached to the encounter.  Labs: Lab Results  Component Value Date   HGBA1C 4.7 (L) 12/12/2020   HGBA1C 7.1 (H) 10/31/2020   HGBA1C 5.8 (H) 08/02/2020   LABURIC 9.1 (H) 10/17/2020   REPTSTATUS 11/12/2020 FINAL 11/11/2020   CULT  11/11/2020    NO GROWTH Performed at [facility], [place] [address] [place], [place] [address]      Lab Results  Component Value Date   ALBUMIN 3.0 (L) 12/11/2020   ALBUMIN 1.8 (L) 11/15/2020   ALBUMIN 1.8 (L) 11/14/2020    Lab Results  Component Value Date   MG 1.7 11/11/2020   MG 1.8 11/10/2020   MG 2.4 10/31/2020   No results found for: VD25OH  No results  found for: PREALBUMIN CBC EXTENDED Latest Ref Rng & Units 03/20/2021 12/14/2020 12/12/2020  WBC 4.0 - 10.5 K/uL 8.3 7.5 8.2  RBC 4.22 - 5.81 MIL/uL 5.01 3.92(L) 3.89(L)  HGB 13.0 - 17.0 g/dL 13.5 11.6(L) 11.0(L)  HCT 39.0 - 52.0 % 42.1 35.5(L) 36.3(L)  PLT 150 - 400 K/uL 212 202 214  NEUTROABS 1.7 - 7.7 K/uL - - -  LYMPHSABS 0.7 - 4.0 K/uL - - -     There is no height or weight on file to calculate BMI.  Orders:  No orders of the defined types were placed in this encounter.  No orders of the defined types were placed in this encounter.    Procedures: No procedures performed  Clinical Data: No additional findings.  ROS:  All other systems negative, except as noted in the HPI. Review of Systems  Objective: Vital Signs: There were no vitals taken for this visit.  Specialty Comments:  No specialty comments available.  PMFS History: Patient Active Problem List   Diagnosis Date Noted   Encounter for loop recorder check 07/26/2021   Cryptogenic stroke ([place]) 05/10/2021   Loop Biotronic [id number] 05/10/2021 05/10/2021   AVM (arteriovenous malformation) 03/20/2021   Pulmonary AV (arteriovenous) fistula ([place]) 02/20/2021   Pre-ulcerative calluses 12/20/2020   Cerebral  thrombosis with cerebral infarction 12/11/2020   Cerebrovascular accident (CVA) due to embolism of cerebral artery ([place]) 12/11/2020   Pressure injury of skin 11/12/2020   Acute urinary retention 11/09/2020   Normal anion gap metabolic acidosis 84/69/6295   Hypotension 11/07/2020   Chest pain 10/31/2020   Acute on chronic HFrEF (heart failure with reduced ejection fraction) ([place])    Goals of care, counseling/discussion    Palliative care by specialist    DNR (do not resuscitate)    Critical limb ischemia with history of revascularization of same extremity ([place]) 10/30/2020   Hyperglycemia 10/30/2020   Tobacco abuse 10/30/2020   Leukocytosis 10/30/2020   Chronic combined systolic and diastolic CHF (congestive  heart failure) ([place]) 10/30/2020   Ischemic cardiomyopathy 10/24/2020   AKI (acute kidney injury) ([place]) 10/17/2020   PAD (peripheral artery disease) ([place]) 10/16/2020   Critical lower limb ischemia ([place]) 10/05/2020   Rest pain of left upper extremity due to atherosclerosis ([place]) 10/05/2020   Severe claudication ([place]) 09/20/2020   Paresthesia of both lower extremities 09/20/2020   H/O cardiac arrest 09/20/2020   H/O acute myocardial infarction 08/14/2020   Snoring 08/14/2020   Sinus pause 08/14/2020   Mixed hyperlipidemia 08/14/2020   Tobacco dependence 08/14/2020   Coronary artery disease involving native coronary artery of native heart without angina pectoris 08/10/2020   HFrEF (heart failure with reduced ejection fraction) ([place]) 08/10/2020   Nonrheumatic mitral valve regurgitation 08/10/2020   STEMI (ST elevation myocardial infarction) ([place]) 08/02/2020   Accelerated hypertension 08/01/2020   Past Medical History:  Diagnosis Date   Anxiety    CAD (coronary artery disease)    Cryptogenic stroke ([place]) 05/10/2021   Encounter for loop recorder check 07/26/2021   Heart attack ([place]) 08/01/2020   Heart failure with reduced ejection fraction ([place])    Hyperlipidemia    Hypertension    Loop Biotronic [id number] 05/10/2021 05/10/2021   PAD (peripheral artery disease) ([place])    right CIA & right distal iliac artery stents 10/16/20; left AKA 11/06/20   PFO (patent foramen ovale) 12/14/2020   Pneumonia    Pulmonary AV (arteriovenous) fistula ([place]) 12/14/2020   Stroke ([place]) 12/11/2020   no residuial effects    Family History  Problem Relation Age of Onset   Heart disease Mother    Heart disease Father    Cancer Brother     Past Surgical History:  Procedure Laterality Date   ABDOMINAL AORTOGRAM W/LOWER EXTREMITY N/A 10/16/2020   Procedure: ABDOMINAL AORTOGRAM W/LOWER EXTREMITY;  Surgeon: Nigel Mormon, MD;  Location: [place] [facility];  Service: Cardiovascular;  Laterality: N/A;    AMPUTATION Left 11/06/2020   Procedure: AMPUTATION ABOVE KNEE;  Surgeon: Rosetta Posner, MD;  Location: [facility];  Service: Vascular;  Laterality: Left;   BUBBLE STUDY  12/14/2020   Procedure: BUBBLE STUDY;  Surgeon: Nigel Mormon, MD;  Location: [facility];  Service: Cardiovascular;;   CARDIAC CATHETERIZATION     CORONARY STENT INTERVENTION N/A 08/03/2020   Procedure: CORONARY STENT INTERVENTION;  Surgeon: Nigel Mormon, MD;  Location: [place];  Service: Cardiovascular;  Laterality: N/A;   CORONARY/GRAFT ACUTE MI REVASCULARIZATION N/A 08/01/2020   Procedure: Coronary/Graft Acute MI Revascularization;  Surgeon: Nigel Mormon, MD;  Location: [facility] [facility];  Service: Cardiovascular;  Laterality: N/A;   INTRAVASCULAR LITHOTRIPSY Right 10/16/2020   Procedure: INTRAVASCULAR LITHOTRIPSY;  Surgeon: Nigel Mormon, MD;  Location: [facility];  Service: Cardiovascular;  Laterality: Right;  Common and  External Iliac   IR ANGIOGRAM PULMONARY LEFT SELECTIVE  03/20/2021   IR ANGIOGRAM SELECTIVE EACH ADDITIONAL VESSEL  03/20/2021   IR EMBO ARTERIAL NOT HEMORR HEMANG INC GUIDE ROADMAPPING  03/20/2021   IR RADIOLOGIST EVAL & MGMT  01/30/2021   IR RADIOLOGIST EVAL & MGMT  04/18/2021   IR RADIOLOGIST EVAL & MGMT  07/25/2021   IR US GUIDE VASC ACCESS RIGHT  03/20/2021   LEFT HEART CATH AND CORONARY ANGIOGRAPHY N/A 08/01/2020   Procedure: LEFT HEART CATH AND CORONARY ANGIOGRAPHY;  Surgeon: Nigel Mormon, MD;  Location: [facility];  Service: Cardiovascular;  Laterality: N/A;   LEFT HEART CATH AND CORONARY ANGIOGRAPHY N/A 08/03/2020   Procedure: LEFT HEART CATH AND CORONARY ANGIOGRAPHY;  Surgeon: Nigel Mormon, MD;  Location: [facility] [facility];  Service: Cardiovascular;  Laterality: N/A;   PERIPHERAL VASCULAR INTERVENTION Right 10/16/2020   Procedure: PERIPHERAL VASCULAR INTERVENTION;  Surgeon: Nigel Mormon, MD;  Location: [facility];  Service:  Cardiovascular;  Laterality: Right;  Common and Iliac Stent   RADIOLOGY WITH ANESTHESIA N/A 03/20/2021   Procedure: PULMONARY EMBOLIZATION;  Surgeon: Aletta Edouard, MD;  Location: [facility];  Service: Radiology;  Laterality: N/A;   TEE WITHOUT CARDIOVERSION N/A 12/14/2020   Procedure: TRANSESOPHAGEAL ECHOCARDIOGRAM (TEE);  Surgeon: Nigel Mormon, MD;  Location: [facility];  Service: Cardiovascular;  Laterality: N/A;   Social History   Occupational History   Occupation: Disability  Tobacco Use   Smoking status: Some Days    Packs/day: 0.50    Years: 40.00    Pack years: 20.00    Types: Cigarettes   Smokeless tobacco: Never   Tobacco comments:    3 - 7 a day  Vaping Use   Vaping Use: Never used  Substance and Sexual Activity   Alcohol use: Yes    Comment: ocassional since MI    Drug use: Never   Sexual activity: Not on file

## 2021-11-25 ENCOUNTER — Other Ambulatory Visit: Payer: Self-pay

## 2021-11-25 MED ORDER — EZETIMIBE 10 MG PO TABS: 10.0000 mg | ORAL_TABLET | Freq: Every day | ORAL | 3 refills | Status: DC

## 2021-11-28 ENCOUNTER — Other Ambulatory Visit: Payer: Self-pay

## 2021-11-28 ENCOUNTER — Encounter: Payer: Self-pay | Admitting: Physical Therapy

## 2021-11-28 ENCOUNTER — Ambulatory Visit (INDEPENDENT_AMBULATORY_CARE_PROVIDER_SITE_OTHER): Payer: BC Managed Care – PPO | Admitting: Physical Therapy

## 2021-11-28 DIAGNOSIS — M79605 Pain in left leg: Secondary | ICD-10-CM

## 2021-11-28 DIAGNOSIS — R2689 Other abnormalities of gait and mobility: Secondary | ICD-10-CM

## 2021-11-28 DIAGNOSIS — Z9181 History of falling: Secondary | ICD-10-CM

## 2021-11-28 DIAGNOSIS — G546 Phantom limb syndrome with pain: Secondary | ICD-10-CM

## 2021-11-28 DIAGNOSIS — M6281 Muscle weakness (generalized): Secondary | ICD-10-CM | POA: Diagnosis not present

## 2021-11-28 DIAGNOSIS — R2681 Unsteadiness on feet: Secondary | ICD-10-CM

## 2021-11-28 DIAGNOSIS — M25652 Stiffness of left hip, not elsewhere classified: Secondary | ICD-10-CM

## 2021-11-28 DIAGNOSIS — R293 Abnormal posture: Secondary | ICD-10-CM

## 2021-11-28 NOTE — Therapy (Signed)
[facility] [address] [place], [place], [address] Phone: [phone]   Fax:  [phone]  Physical Therapy Treatment & Recertification  Patient Details  Name: Mike [name] MRN: [id number] Date of Birth: [birth year] Referring Provider (PT): Meridee Score, MD   Encounter Date: 11/28/2021   PT End of Session - 11/28/21 2222     Visit Number 21    Number of Visits 39    Date for PT Re-Evaluation 02/20/22    Authorization Type BCBS    Authorization Time Period OOP & Deductible MET for 2022    PT Start Time 1145    PT Stop Time 1231    PT Time Calculation (min) 46 min    Equipment Utilized During Treatment Gait belt    Activity Tolerance Patient tolerated treatment well;Patient limited by pain    Behavior During Therapy [facility] for tasks assessed/performed             Past Medical History:  Diagnosis Date   Anxiety    CAD (coronary artery disease)    Cryptogenic stroke ([place]) 05/10/2021   Encounter for loop recorder check 07/26/2021   Heart attack ([place]) 08/01/2020   Heart failure with reduced ejection fraction ([place])    Hyperlipidemia    Hypertension    Loop Biotronic [id number] 05/10/2021 05/10/2021   PAD (peripheral artery disease) ([place])    right CIA & right distal iliac artery stents 10/16/20; left AKA 11/06/20   PFO (patent foramen ovale) 12/14/2020   Pneumonia    Pulmonary AV (arteriovenous) fistula ([place]) 12/14/2020   Stroke ([place]) 12/11/2020   no residuial effects    Past Surgical History:  Procedure Laterality Date   ABDOMINAL AORTOGRAM W/LOWER EXTREMITY N/A 10/16/2020   Procedure: ABDOMINAL AORTOGRAM W/LOWER EXTREMITY;  Surgeon: Nigel Mormon, MD;  Location: [place] [facility];  Service: Cardiovascular;  Laterality: N/A;   AMPUTATION Left 11/06/2020   Procedure: AMPUTATION ABOVE KNEE;  Surgeon: Rosetta Posner, MD;  Location: [place];  Service: Vascular;  Laterality: Left;   BUBBLE STUDY  12/14/2020   Procedure: BUBBLE STUDY;   Surgeon: Nigel Mormon, MD;  Location: [place];  Service: Cardiovascular;;   CARDIAC CATHETERIZATION     CORONARY STENT INTERVENTION N/A 08/03/2020   Procedure: CORONARY STENT INTERVENTION;  Surgeon: Nigel Mormon, MD;  Location: [place] [facility];  Service: Cardiovascular;  Laterality: N/A;   CORONARY/GRAFT ACUTE MI REVASCULARIZATION N/A 08/01/2020   Procedure: Coronary/Graft Acute MI Revascularization;  Surgeon: Nigel Mormon, MD;  Location: [place] [facility];  Service: Cardiovascular;  Laterality: N/A;   INTRAVASCULAR LITHOTRIPSY Right 10/16/2020   Procedure: INTRAVASCULAR LITHOTRIPSY;  Surgeon: Nigel Mormon, MD;  Location: [place] [facility];  Service: Cardiovascular;  Laterality: Right;  Common and External Iliac   IR ANGIOGRAM PULMONARY LEFT SELECTIVE  03/20/2021   IR ANGIOGRAM SELECTIVE EACH ADDITIONAL VESSEL  03/20/2021   IR EMBO ARTERIAL NOT HEMORR HEMANG INC GUIDE ROADMAPPING  03/20/2021   IR RADIOLOGIST EVAL & MGMT  01/30/2021   IR RADIOLOGIST EVAL & MGMT  04/18/2021   IR RADIOLOGIST EVAL & MGMT  07/25/2021   IR US GUIDE VASC ACCESS RIGHT  03/20/2021   LEFT HEART CATH AND CORONARY ANGIOGRAPHY N/A 08/01/2020   Procedure: LEFT HEART CATH AND CORONARY ANGIOGRAPHY;  Surgeon: Nigel Mormon, MD;  Location: [place] [facility];  Service: Cardiovascular;  Laterality: N/A;   LEFT HEART CATH AND CORONARY ANGIOGRAPHY N/A 08/03/2020   Procedure: LEFT HEART CATH AND CORONARY  ANGIOGRAPHY;  Surgeon: Nigel Mormon, MD;  Location: [place] [facility];  Service: Cardiovascular;  Laterality: N/A;   PERIPHERAL VASCULAR INTERVENTION Right 10/16/2020   Procedure: PERIPHERAL VASCULAR INTERVENTION;  Surgeon: Nigel Mormon, MD;  Location: [place] [facility];  Service: Cardiovascular;  Laterality: Right;  Common and Iliac Stent   RADIOLOGY WITH ANESTHESIA N/A 03/20/2021   Procedure: PULMONARY EMBOLIZATION;  Surgeon: Aletta Edouard, MD;  Location: [place];  Service:  Radiology;  Laterality: N/A;   TEE WITHOUT CARDIOVERSION N/A 12/14/2020   Procedure: TRANSESOPHAGEAL ECHOCARDIOGRAM (TEE);  Surgeon: Nigel Mormon, MD;  Location: [facility];  Service: Cardiovascular;  Laterality: N/A;    There were no vitals filed for this visit.   Subjective Assessment - 11/28/21 1145     Subjective Dr. Sharol Given ordered Cymbalta and he has improved his phantom pain.  He has been wearing liner daily. He could only tolerate prosthesis itself 2-3 hours / day.  in last week with phantom pain under control he has progressively increased wear. Yesterday, he wore prosthesis 6hrs then break 2hr then4hrs. No change in phantom pain. He had mild to moderate limb pain after wear.  He has not seen prosthetist for any adjustments to prosthesis.    Pertinent History PAD, gangrene s/p L AKA 11/06/20, HTN, CAD s/p STEMI with stenting 08/17/2020, and CHF. cryptogenic stroke Dec 2021    Patient Stated Goals to return to work, (he was released from current job over last weekend but wants to find job).  to walk in community,    Currently in Pain? Yes    Pain Score 4    in last week, lowest 0/10 - highest 8/10   Pain Location Leg   residual limb   Pain Orientation Left;Other (Comment);Distal   incision line   Pain Descriptors / Indicators Spasm;Squeezing;Throbbing    Pain Onset More than a month ago    Pain Frequency Intermittent    Aggravating Factors  increasing prosthesis wear, makes it hurt next morning    Pain Relieving Factors meds & tylenol    Effect of Pain on Daily Activities slow increase in wear    Multiple Pain Sites Yes    Pain Score 0   in last week, highest maybe 4/10 for <1hr.   Pain Location Other (Comment)   phantom   Pain Orientation Left    Pain Descriptors / Indicators Other (Comment)   phantom sensations   Pain Type Phantom pain    Pain Onset More than a month ago    Pain Frequency Intermittent    Aggravating Factors  unknown    Pain Relieving Factors Cymbalata &  mirror therapy                [facility] PT Assessment - 11/28/21 1145       Assessment   Medical Diagnosis Left Transfemoral Amputation    Referring Provider (PT) Meridee Score, MD    Onset Date/Surgical Date 05/21/21      Transfers   Transfers Sit to Stand;Stand to Sit    Sit to Stand 5: Supervision;With upper extremity assist;From chair/3-in-1    Five time sit to stand comments  --    Stand to Sit 5: Supervision;With upper extremity assist;To chair/3-in-1      Ambulation/Gait   Ambulation/Gait Yes    Ambulation/Gait Assistance 4: Min assist;4: Min guard;6: Modified independent (Device/Increase time)   min guard right crutch, minA no AD, mod ind bil forearm crutches   Ambulation Distance (Feet) 50  Feet   enter/exit bil forearm crutches, assessed with rt forearm crutch 50' & no AD 20'   Assistive device R Forearm Crutch;Prosthesis    Ramp --    Curb --      Oceanographer Test   Sit to Stand Able to stand  independently using hands    Standing Unsupported Able to stand safely 2 minutes    Sitting with Back Unsupported but Feet Supported on Floor or Stool Able to sit safely and securely 2 minutes    Stand to Sit Controls descent by using hands    Transfers Able to transfer safely, minor use of hands    Standing Unsupported with Eyes Closed Able to stand 10 seconds with supervision    Standing Unsupported with Feet Together Able to place feet together independently and stand for 1 minute with supervision    From Standing, Reach Forward with Outstretched Arm Can reach confidently >25 cm (10")    From Standing Position, Pick up Object from Floor Able to pick up shoe, needs supervision    From Standing Position, Turn to Look Behind Over each Shoulder Looks behind one side only/other side shows less weight shift    Turn 360 Degrees Needs close supervision or verbal cueing    Standing Unsupported, Alternately Place Feet on Step/Stool Able to complete >2 steps/needs minimal assist     Standing Unsupported, One Foot in Front Able to take small step independently and hold 30 seconds    Standing on One Leg Tries to lift leg/unable to hold 3 seconds but remains standing independently    Total Score 39    Berg comment: initial, 05/22/21 was 24/56 and 08/06/2021 was 33/56      Timed Up and Go Test   Normal TUG (seconds) 33.53   08/07/2021 was 49.38sec w/right crutch, today 27.54sec w/right crutch & 33.53sec without device except prosthesis with minA            Prosthetics Assessment - 11/28/21 1145       Prosthetics   Prosthetic Care Independent with Skin check;Care of non-amputated limb;Prosthetic cleaning;Ply sock cleaning    Prosthetic Care Dependent with Residual limb care;Correct ply sock adjustment;Proper wear schedule/adjustment    Current prosthetic wear tolerance (days/week)  daily    Current prosthetic wear tolerance (#hours/day)  He reports wearing liner daily. He has been limited in prosthesis wear due to phantom pain. He has progressed time over last week as meds have controlled phantom pain. Yesterday he wore prosthesis 10hrs total (6 in am & 4 in afternoon).  He had residual limb pain distally from wear.    Current prosthetic weight-bearing tolerance (hours/day)  see pain assessment for residual limb pain.    Residual limb condition  reports no issues    Prosthesis Description silicon liner with velcro lanyard suspension, ischial containment socket, microprocessor knee / foot system.                          [facility] PT Treatment/Exercise - 11/28/21 1145       Prosthetics   Prosthetic Care Comments  PT recommended wear 6hrs initiating upon arising, 2hr break, then wear as long as tolerated in afternoon with goal to progress until bedtime. Once he tolerates arising until bedtime except 2 hrs midday then we will decrease midday off time.  He verbalizes understanding that wearing prosthesis all awake hours is first step to functioning with  prosthesis. However we have to work  within pain as can limited function if he is in pain.  PT recommended setting up appt with prosthetist for alignment & possible pads in socket to tighten fit.    Education Provided Proper wear schedule/adjustment;Other (comment)   see prosthetic care comments   Person(s) Educated Patient    Education Method Explanation;Verbal cues    Education Method Verbalized understanding;Needs further instruction    Donning Prosthesis Modified independent (device/increased time)                       PT Short Term Goals - 11/28/21 2224       PT SHORT TERM GOAL #1   Title Patient reports wearing prosthesis >12 hours 7 days /wk. with residual limb pain </=4/10    Time 1    Period Months    Status Revised    Target Date 12/26/21      PT SHORT TERM GOAL #2   Title Patient ambulates 150' with single forearm crutch & prosthesis with supervision.    Time 1    Period Months    Status On-going    Target Date 12/26/21      PT SHORT TERM GOAL #3   Title Patient able to pick up 5# from floor without UE support safely.    Time 1    Period Months    Status On-going    Target Date 12/26/21      PT SHORT TERM GOAL #4   Title Patient descends stairs with 1 rail & 1 crutch using step over step pattern with supervision.    Time 1    Period Months    Status On-going    Target Date 12/26/21               PT Long Term Goals - 11/28/21 2226       PT LONG TERM GOAL #1   Title Patient demonstrates & verbalized understanding of prosthetic care to enable safe utilization of prosthesis and tolerates wear >90% of awake hours without skin issues or limb pain.    Time 16    Period Weeks    Status On-going    Target Date 03/21/21      PT LONG TERM GOAL #2   Title Timed Up & Go standard with prosthesis only < 20sec and cognitive TUG with prosthesis only < 30 sec.    Time 16    Period Weeks    Status On-going    Target Date 03/21/21      PT LONG TERM  GOAL #3   Title Berg Balance >/= 45/56 to indicate lower fall risk    Time 16    Period Weeks    Status On-going    Target Date 03/21/21      PT LONG TERM GOAL #4   Title Patient ambulates 500' with cane or single forearm crutch & prosthesis modified independent.    Time 16    Period Weeks    Status On-going    Target Date 03/21/21      PT LONG TERM GOAL #5   Title Patient negotiates ramps, curbs & stairs single rail with cane or single forearm crutch & prosthesis modified independent.    Time 20    Period Weeks    Status On-going    Target Date 03/21/21      PT LONG TERM GOAL #6   Title Gait velocity with single forearm crutch or cane and prosthesis >/= 1.90 ft/sec comfortable pace & >/=  2.40 ft/sec fast pace.    Time 16    Period Weeks    Status On-going    Target Date 03/21/21                   Plan - 11/28/21 2231     Clinical Impression Statement This patient has been having phantom pain to level impairing function including wearing prosthesis which limits function.  He has improved Berg Balance to 39/56 but still indicates high fall risk. His Timed Up & Go has improved but still indicates high fall risk. He is using bilateral forearm crutches for gait modified independent. He requires assist & skilled PT for gait with single crutch and with no assistive device.  Patient's pain appears controlled with new medication which enables him to resume PT to progress his mobility & function with his prosthesis.    Personal Factors and Comorbidities Comorbidity 3+;Fitness;Time since onset of injury/illness/exacerbation    Comorbidities PAD, gangrene s/p L AKA 11/06/20, HTN, CAD s/p STEMI with stenting 08/17/2020, and CHF. cryptogenic stroke Dec 2021    Examination-Activity Limitations Lift;Locomotion Level;Squat;Stairs;Stand;Transfers;Carry    Examination-Participation Restrictions Community Activity;Occupation    Stability/Clinical Decision Making Evolving/Moderate complexity     Clinical Decision Making Moderate    Rehab Potential Good    PT Frequency 2x / week    PT Duration Other (comment)   16 weeks   PT Treatment/Interventions ADLs/Self Care Home Management;DME Instruction;Gait training;Stair training;Functional mobility training;Therapeutic activities;Therapeutic exercise;Balance training;Neuromuscular re-education;Patient/family education;Prosthetic Training;Manual techniques;Passive range of motion    PT Next Visit Plan check & update HEP including endurance, flexibility, strength & balance    Consulted and Agree with Plan of Care Patient             Patient will benefit from skilled therapeutic intervention in order to improve the following deficits and impairments:  Decreased activity tolerance, Decreased balance, Decreased endurance, Decreased knowledge of use of DME, Decreased mobility, Decreased range of motion, Decreased strength, Increased edema, Postural dysfunction, Prosthetic Dependency, Pain, Abnormal gait  Visit Diagnosis: Other abnormalities of gait and mobility  Unsteadiness on feet  Muscle weakness (generalized)  Abnormal posture  Stiffness of left hip, not elsewhere classified  History of falling  Pain in left leg  Phantom limb syndrome with pain [facility])     Problem List Patient Active Problem List   Diagnosis Date Noted   Encounter for loop recorder check 07/26/2021   Cryptogenic stroke ([place]) 05/10/2021   Loop [employer]otronic [id number] 05/10/2021 05/10/2021   AVM (arteriovenous malformation) 03/20/2021   Pulmonary AV (arteriovenous) fistula ([place]) 02/20/2021   Pre-ulcerative calluses 12/20/2020   Cerebral thrombosis with cerebral infarction 12/11/2020   Cerebrovascular accident (CVA) due to embolism of cerebral artery ([place]) 12/11/2020   Pressure injury of skin 11/12/2020   Acute urinary retention 11/09/2020   Normal anion gap metabolic acidosis 60/09/9322   Hypotension 11/07/2020   Chest pain 10/31/2020   Acute on chronic  HFrEF (heart failure with reduced ejection fraction) ([place])    Goals of care, counseling/discussion    Palliative care by specialist    DNR (do not resuscitate)    Critical limb ischemia with history of revascularization of same extremity ([place]) 10/30/2020   Hyperglycemia 10/30/2020   Tobacco abuse 10/30/2020   Leukocytosis 10/30/2020   Chronic combined systolic and diastolic CHF (congestive heart failure) ([place]) 10/30/2020   Ischemic cardiomyopathy 10/24/2020   AKI (acute kidney injury) ([place]) 10/17/2020   PAD (peripheral artery disease) ([place]) 10/16/2020   Critical lower limb  ischemia ([place]) 10/05/2020   Rest pain of left upper extremity due to atherosclerosis ([place]) 10/05/2020   Severe claudication ([place]) 09/20/2020   Paresthesia of both lower extremities 09/20/2020   H/O cardiac arrest 09/20/2020   H/O acute myocardial infarction 08/14/2020   Snoring 08/14/2020   Sinus pause 08/14/2020   Mixed hyperlipidemia 08/14/2020   Tobacco dependence 08/14/2020   Coronary artery disease involving native coronary artery of native heart without angina pectoris 08/10/2020   HFrEF (heart failure with reduced ejection fraction) ([place]) 08/10/2020   Nonrheumatic mitral valve regurgitation 08/10/2020   STEMI (ST elevation myocardial infarction) ([place]) 08/02/2020   Accelerated hypertension 08/01/2020    Jamey Reas, PT, DPT 11/28/2021, 10:38 PM  [facility] [address] [place], [place], [address] Phone: [phone]   Fax:  [phone]  Name: GRAHM ETSITTY MRN: [id number] Date of Birth: 1961/11/15

## 2021-12-02 ENCOUNTER — Ambulatory Visit (INDEPENDENT_AMBULATORY_CARE_PROVIDER_SITE_OTHER): Payer: BC Managed Care – PPO | Admitting: Physical Therapy

## 2021-12-02 ENCOUNTER — Encounter: Payer: Self-pay | Admitting: Physical Therapy

## 2021-12-02 ENCOUNTER — Other Ambulatory Visit: Payer: Self-pay

## 2021-12-02 DIAGNOSIS — M79605 Pain in left leg: Secondary | ICD-10-CM

## 2021-12-02 DIAGNOSIS — G546 Phantom limb syndrome with pain: Secondary | ICD-10-CM

## 2021-12-02 DIAGNOSIS — R293 Abnormal posture: Secondary | ICD-10-CM | POA: Diagnosis not present

## 2021-12-02 DIAGNOSIS — R2681 Unsteadiness on feet: Secondary | ICD-10-CM

## 2021-12-02 DIAGNOSIS — R2689 Other abnormalities of gait and mobility: Secondary | ICD-10-CM | POA: Diagnosis not present

## 2021-12-02 DIAGNOSIS — M6281 Muscle weakness (generalized): Secondary | ICD-10-CM | POA: Diagnosis not present

## 2021-12-02 DIAGNOSIS — M25652 Stiffness of left hip, not elsewhere classified: Secondary | ICD-10-CM

## 2021-12-02 DIAGNOSIS — Z9181 History of falling: Secondary | ICD-10-CM

## 2021-12-02 NOTE — Patient Instructions (Signed)
4 types of  exercises: flexibility, endurance, strength & balance.    Access Code: [email or web address] URL: [email or web address] Date: 12/02/2021 Prepared by: Jamey Reas  Exercises Seated Hamstring Stretch with Strap - 1 x daily - 7 x weekly - 1 sets - 2-3 reps - 30 seconds hold Seated Trunk Rotation Stretch - 1 x daily - 7 x weekly - 1 sets - 2-3 reps - 30 seconds hold Supine Lower Trunk Rotation - 1 x daily - 7 x weekly - 1 sets - 2-3 reps - 30 seconds hold Modified Thomas Stretch - 1 x daily - 7 x weekly - 1 sets - 2-3 reps - 30 seconds hold Supine Dynamic Modified Thomas Quad and Hip Flexor Dynamic Stretch - 1 x daily - 7 x weekly - 1 sets - 2-3 reps - 30 seconds hold Supine Quadriceps Stretch with Strap on Table - 1 x daily - 7 x weekly - 1 sets - 2-3 reps - 30 seconds hold Standing Hip Flexion with Resistance (Mirrored) - 1 x daily - 7 x weekly - 1 sets - 10-15 reps Standing Hip Extension with Resistance - 1 x daily - 7 x weekly - 1 sets - 10-15 reps Standing Hip Adduction with Resistance (Mirrored) - 1 x daily - 7 x weekly - 1 sets - 10-15 reps Standing Hip Abduction with Theraband Resistance - 1 x daily - 7 x weekly - 1 sets - 10-15 reps

## 2021-12-02 NOTE — Therapy (Signed)
[facility] [address] [place], [place], [address] Phone: [phone]   Fax:  [phone]  Physical Therapy Treatment  Patient Details  Name: Mike [name] MRN: [id number] Date of Birth: 20-Mar-1961 Referring Provider (PT): Meridee Score, MD   Encounter Date: 12/02/2021   PT End of Session - 12/02/21 1015     Visit Number 22    Number of Visits 53    Date for PT Re-Evaluation 02/20/22    Authorization Type BCBS    Authorization Time Period OOP & Deductible MET for 2022    PT Start Time 1015    PT Stop Time 1100    PT Time Calculation (min) 45 min    Equipment Utilized During Treatment Gait belt    Activity Tolerance Patient tolerated treatment well;Patient limited by pain    Behavior During Therapy [facility] for tasks assessed/performed             Past Medical History:  Diagnosis Date   Anxiety    CAD (coronary artery disease)    Cryptogenic stroke ([place]) 05/10/2021   Encounter for loop recorder check 07/26/2021   Heart attack ([place]) 08/01/2020   Heart failure with reduced ejection fraction [facility])    Hyperlipidemia    Hypertension    Loop [employer] [id number] 05/10/2021 05/10/2021   PAD (peripheral artery disease) ([place])    right CIA & right distal iliac artery stents 10/16/20; left AKA 11/06/20   PFO (patent foramen ovale) 12/14/2020   Pneumonia    Pulmonary AV (arteriovenous) fistula ([place]) 12/14/2020   Stroke ([place]) 12/11/2020   no residuial effects    Past Surgical History:  Procedure Laterality Date   ABDOMINAL AORTOGRAM W/LOWER EXTREMITY N/A 10/16/2020   Procedure: ABDOMINAL AORTOGRAM W/LOWER EXTREMITY;  Surgeon: Nigel Mormon, MD;  Location: [place] [facility];  Service: Cardiovascular;  Laterality: N/A;   AMPUTATION Left 11/06/2020   Procedure: AMPUTATION ABOVE KNEE;  Surgeon: Rosetta Posner, MD;  Location: [place];  Service: Vascular;  Laterality: Left;   BUBBLE STUDY  12/14/2020   Procedure: BUBBLE STUDY;  Surgeon:  Nigel Mormon, MD;  Location: [place];  Service: Cardiovascular;;   CARDIAC CATHETERIZATION     CORONARY STENT INTERVENTION N/A 08/03/2020   Procedure: CORONARY STENT INTERVENTION;  Surgeon: Nigel Mormon, MD;  Location: [place] [facility];  Service: Cardiovascular;  Laterality: N/A;   CORONARY/GRAFT ACUTE MI REVASCULARIZATION N/A 08/01/2020   Procedure: Coronary/Graft Acute MI Revascularization;  Surgeon: Nigel Mormon, MD;  Location: [place] [facility];  Service: Cardiovascular;  Laterality: N/A;   INTRAVASCULAR LITHOTRIPSY Right 10/16/2020   Procedure: INTRAVASCULAR LITHOTRIPSY;  Surgeon: Nigel Mormon, MD;  Location: [place] [facility];  Service: Cardiovascular;  Laterality: Right;  Common and External Iliac   IR ANGIOGRAM PULMONARY LEFT SELECTIVE  03/20/2021   IR ANGIOGRAM SELECTIVE EACH ADDITIONAL VESSEL  03/20/2021   IR EMBO ARTERIAL NOT HEMORR HEMANG INC GUIDE ROADMAPPING  03/20/2021   IR RADIOLOGIST EVAL & MGMT  01/30/2021   IR RADIOLOGIST EVAL & MGMT  04/18/2021   IR RADIOLOGIST EVAL & MGMT  07/25/2021   IR US GUIDE VASC ACCESS RIGHT  03/20/2021   LEFT HEART CATH AND CORONARY ANGIOGRAPHY N/A 08/01/2020   Procedure: LEFT HEART CATH AND CORONARY ANGIOGRAPHY;  Surgeon: Nigel Mormon, MD;  Location: [place] [facility];  Service: Cardiovascular;  Laterality: N/A;   LEFT HEART CATH AND CORONARY ANGIOGRAPHY N/A 08/03/2020   Procedure: LEFT HEART CATH AND CORONARY ANGIOGRAPHY;  Surgeon: Nigel Mormon, MD;  Location: [place] [facility];  Service: Cardiovascular;  Laterality: N/A;   PERIPHERAL VASCULAR INTERVENTION Right 10/16/2020   Procedure: PERIPHERAL VASCULAR INTERVENTION;  Surgeon: Nigel Mormon, MD;  Location: [place] [facility];  Service: Cardiovascular;  Laterality: Right;  Common and Iliac Stent   RADIOLOGY WITH ANESTHESIA N/A 03/20/2021   Procedure: PULMONARY EMBOLIZATION;  Surgeon: Aletta Edouard, MD;  Location: [place];  Service: Radiology;   Laterality: N/A;   TEE WITHOUT CARDIOVERSION N/A 12/14/2020   Procedure: TRANSESOPHAGEAL ECHOCARDIOGRAM (TEE);  Surgeon: Nigel Mormon, MD;  Location: [facility];  Service: Cardiovascular;  Laterality: N/A;    There were no vitals filed for this visit.   Subjective Assessment - 12/02/21 1016     Subjective No falls. no issues. He wore prosthesis 5hrs 2x/day.    Pertinent History PAD, gangrene s/p L AKA 11/06/20, HTN, CAD s/p STEMI with stenting 08/17/2020, and CHF. cryptogenic stroke Dec 2021    Patient Stated Goals to return to work, (he was released from current job over last weekend but wants to find job).  to walk in community,    Currently in Pain? Yes    Pain Score 3    since PT eval, limb pain ranged from 2/10 - 6/10   Pain Location Leg   residual limb   Pain Orientation Left    Pain Descriptors / Indicators Aching;Sore;Squeezing;Throbbing    Pain Type Acute pain    Pain Onset More than a month ago    Pain Frequency Intermittent    Aggravating Factors  unknown, mornings are worse    Pain Relieving Factors meds & tylenol    Multiple Pain Sites No   no phantom pain,  he does have phantom sensations   Pain Onset More than a month ago                               [facility] Adult PT Treatment/Exercise - 12/02/21 1015       Transfers   Transfers Sit to Stand;Stand to Sit    Sit to Stand 5: Supervision;With upper extremity assist;From chair/3-in-1    Stand to Sit 5: Supervision;With upper extremity assist;To chair/3-in-1      Ambulation/Gait   Ambulation/Gait Yes    Ambulation/Gait Assistance 4: Min guard   min guard right crutch   Ambulation Distance (Feet) 50 Feet   50' X 2   Assistive device R Forearm Crutch;Prosthesis      Exercises   Exercises Other Exercises    Other Exercises  HEP instruction Access Code: [email or web address]      Prosthetics   Prosthetic Care Comments  --    Current prosthetic wear tolerance (days/week)  daily    Current prosthetic  wear tolerance (#hours/day)  5hrs 2x/day    Current prosthetic weight-bearing tolerance (hours/day)  see pain assessment for residual limb pain.    Residual limb condition  reports no issues    Education Provided Proper wear schedule/adjustment;Other (comment)   see prosthetic care comments                    PT Education - 12/02/21 1054     Education Details Access Code: [email or web address]    Person(s) Educated Patient    Methods Explanation;Demonstration;Tactile cues;Verbal cues    Comprehension Verbalized understanding;Returned demonstration;Verbal cues required;Tactile cues required;Need further instruction  PT Short Term Goals - 12/02/21 1249       PT SHORT TERM GOAL #1   Title Patient reports wearing prosthesis >12 hours 7 days /wk. with residual limb pain </=4/10    Time 1    Period Months    Status On-going    Target Date 12/26/21      PT SHORT TERM GOAL #2   Title Patient ambulates 150' with single forearm crutch & prosthesis with supervision.    Time 1    Period Months    Status On-going    Target Date 12/26/21      PT SHORT TERM GOAL #3   Title Patient able to pick up 5# from floor without UE support safely.    Time 1    Period Months    Status On-going    Target Date 12/26/21      PT SHORT TERM GOAL #4   Title Patient descends stairs with 1 rail & 1 crutch using step over step pattern with supervision.    Time 1    Period Months    Status On-going    Target Date 12/26/21               PT Long Term Goals - 11/28/21 2226       PT LONG TERM GOAL #1   Title Patient demonstrates & verbalized understanding of prosthetic care to enable safe utilization of prosthesis and tolerates wear >90% of awake hours without skin issues or limb pain.    Time 16    Period Weeks    Status On-going    Target Date 03/21/21      PT LONG TERM GOAL #2   Title Timed Up & Go standard with prosthesis only < 20sec and cognitive TUG with prosthesis only <  30 sec.    Time 16    Period Weeks    Status On-going    Target Date 03/21/21      PT LONG TERM GOAL #3   Title Berg Balance >/= 45/56 to indicate lower fall risk    Time 16    Period Weeks    Status On-going    Target Date 03/21/21      PT LONG TERM GOAL #4   Title Patient ambulates 500' with cane or single forearm crutch & prosthesis modified independent.    Time 16    Period Weeks    Status On-going    Target Date 03/21/21      PT LONG TERM GOAL #5   Title Patient negotiates ramps, curbs & stairs single rail with cane or single forearm crutch & prosthesis modified independent.    Time 20    Period Weeks    Status On-going    Target Date 03/21/21      PT LONG TERM GOAL #6   Title Gait velocity with single forearm crutch or cane and prosthesis >/= 1.90 ft/sec comfortable pace & >/= 2.40 ft/sec fast pace.    Time 16    Period Weeks    Status On-going    Target Date 03/21/21                   Plan - 12/02/21 1015     Clinical Impression Statement PT instructed in HEP to initiate some flexibility, strength & balance. He appears to understand.    Personal Factors and Comorbidities Comorbidity 3+;Fitness;Time since onset of injury/illness/exacerbation    Comorbidities PAD, gangrene s/p L AKA 11/06/20, HTN, CAD  s/p STEMI with stenting 08/17/2020, and CHF. cryptogenic stroke Dec 2021    Examination-Activity Limitations Lift;Locomotion Level;Squat;Stairs;Stand;Transfers;Carry    Examination-Participation Restrictions Community Activity;Occupation    Stability/Clinical Decision Making Evolving/Moderate complexity    Rehab Potential Good    PT Frequency 2x / week    PT Duration Other (comment)   16 weeks   PT Treatment/Interventions ADLs/Self Care Home Management;DME Instruction;Gait training;Stair training;Functional mobility training;Therapeutic activities;Therapeutic exercise;Balance training;Neuromuscular re-education;Patient/family education;Prosthetic  Training;Manual techniques;Passive range of motion    PT Next Visit Plan check HEP including endurance, flexibility, strength & balance,  work on standing balance & gait    Consulted and Agree with Plan of Care Patient             Patient will benefit from skilled therapeutic intervention in order to improve the following deficits and impairments:  Decreased activity tolerance, Decreased balance, Decreased endurance, Decreased knowledge of use of DME, Decreased mobility, Decreased range of motion, Decreased strength, Increased edema, Postural dysfunction, Prosthetic Dependency, Pain, Abnormal gait  Visit Diagnosis: Other abnormalities of gait and mobility  Muscle weakness (generalized)  Unsteadiness on feet  Abnormal posture  Stiffness of left hip, not elsewhere classified  History of falling  Pain in left leg  Phantom limb syndrome with pain [facility])     Problem List Patient Active Problem List   Diagnosis Date Noted   Encounter for loop recorder check 07/26/2021   Cryptogenic stroke ([place]) 05/10/2021   Loop [employer] [id number] 05/10/2021 05/10/2021   AVM (arteriovenous malformation) 03/20/2021   Pulmonary AV (arteriovenous) fistula ([place]) 02/20/2021   Pre-ulcerative calluses 12/20/2020   Cerebral thrombosis with cerebral infarction 12/11/2020   Cerebrovascular accident (CVA) due to embolism of cerebral artery ([place]) 12/11/2020   Pressure injury of skin 11/12/2020   Acute urinary retention 11/09/2020   Normal anion gap metabolic acidosis 00/34/9179   Hypotension 11/07/2020   Chest pain 10/31/2020   Acute on chronic HFrEF (heart failure with reduced ejection fraction) ([place])    Goals of care, counseling/discussion    Palliative care by specialist    DNR (do not resuscitate)    Critical limb ischemia with history of revascularization of same extremity ([place]) 10/30/2020   Hyperglycemia 10/30/2020   Tobacco abuse 10/30/2020   Leukocytosis 10/30/2020   Chronic combined  systolic and diastolic CHF (congestive heart failure) ([place]) 10/30/2020   Ischemic cardiomyopathy 10/24/2020   AKI (acute kidney injury) ([place]) 10/17/2020   PAD (peripheral artery disease) ([place]) 10/16/2020   Critical lower limb ischemia ([place]) 10/05/2020   Rest pain of left upper extremity due to atherosclerosis ([place]) 10/05/2020   Severe claudication ([place]) 09/20/2020   Paresthesia of both lower extremities 09/20/2020   H/O cardiac arrest 09/20/2020   H/O acute myocardial infarction 08/14/2020   Snoring 08/14/2020   Sinus pause 08/14/2020   Mixed hyperlipidemia 08/14/2020   Tobacco dependence 08/14/2020   Coronary artery disease involving native coronary artery of native heart without angina pectoris 08/10/2020   HFrEF (heart failure with reduced ejection fraction) ([place]) 08/10/2020   Nonrheumatic mitral valve regurgitation 08/10/2020   STEMI (ST elevation myocardial infarction) ([place]) 08/02/2020   Accelerated hypertension 08/01/2020    Jamey Reas, PT, DPT 12/02/2021, 12:53 PM  [facility] [address] [place], [place], [address] Phone: [phone]   Fax:  [phone]  Name: Mike [name] MRN: [id number] Date of Birth: 05/14/1961

## 2021-12-04 ENCOUNTER — Encounter: Payer: BC Managed Care – PPO | Admitting: Physical Therapy

## 2021-12-06 DIAGNOSIS — I739 Peripheral vascular disease, unspecified: Secondary | ICD-10-CM | POA: Diagnosis not present

## 2021-12-06 DIAGNOSIS — I2510 Atherosclerotic heart disease of native coronary artery without angina pectoris: Secondary | ICD-10-CM | POA: Diagnosis not present

## 2021-12-07 LAB — LIPID PANEL
Chol/HDL Ratio: 3.4 ratio (ref 0.0–5.0)
Cholesterol, Total: 127 mg/dL (ref 100–199)
HDL: 37 mg/dL — ABNORMAL LOW (ref >39)
LDL Chol Calc (NIH): 55 mg/dL (ref 0–99)
Triglycerides: 215 mg/dL — ABNORMAL HIGH (ref 0–149)
VLDL Cholesterol Cal: 35 mg/dL (ref 5–40)

## 2021-12-10 ENCOUNTER — Encounter: Payer: BC Managed Care – PPO | Admitting: Physical Therapy

## 2021-12-12 ENCOUNTER — Encounter: Payer: Self-pay | Admitting: Physical Therapy

## 2021-12-12 ENCOUNTER — Ambulatory Visit (INDEPENDENT_AMBULATORY_CARE_PROVIDER_SITE_OTHER): Payer: BC Managed Care – PPO | Admitting: Physical Therapy

## 2021-12-12 ENCOUNTER — Other Ambulatory Visit: Payer: Self-pay

## 2021-12-12 DIAGNOSIS — Z4509 Encounter for adjustment and management of other cardiac device: Secondary | ICD-10-CM | POA: Diagnosis not present

## 2021-12-12 DIAGNOSIS — M6281 Muscle weakness (generalized): Secondary | ICD-10-CM

## 2021-12-12 DIAGNOSIS — I5020 Unspecified systolic (congestive) heart failure: Secondary | ICD-10-CM | POA: Diagnosis not present

## 2021-12-12 DIAGNOSIS — R2689 Other abnormalities of gait and mobility: Secondary | ICD-10-CM

## 2021-12-12 DIAGNOSIS — M25652 Stiffness of left hip, not elsewhere classified: Secondary | ICD-10-CM

## 2021-12-12 DIAGNOSIS — R293 Abnormal posture: Secondary | ICD-10-CM

## 2021-12-12 DIAGNOSIS — Z95818 Presence of other cardiac implants and grafts: Secondary | ICD-10-CM | POA: Diagnosis not present

## 2021-12-12 DIAGNOSIS — R2681 Unsteadiness on feet: Secondary | ICD-10-CM

## 2021-12-12 NOTE — Therapy (Signed)
[facility] [address] [place], [place], [address] Phone: [phone]   Fax:  [phone]  Physical Therapy Treatment  Patient Details  Name: Mike [name] MRN: [id number] Date of Birth: [birth year] Referring Provider (PT): Meridee Score, MD   Encounter Date: 12/12/2021   PT End of Session - 12/12/21 1020     Visit Number 23    Number of Visits 69    Date for PT Re-Evaluation 02/20/22    Authorization Type BCBS    Authorization Time Period OOP & Deductible MET for 2022    PT Start Time 1015    PT Stop Time 1100    PT Time Calculation (min) 45 min    Equipment Utilized During Treatment Gait belt    Activity Tolerance Patient tolerated treatment well;Patient limited by pain    Behavior During Therapy [facility] for tasks assessed/performed             Past Medical History:  Diagnosis Date   Anxiety    CAD (coronary artery disease)    Cryptogenic stroke ([place]) 05/10/2021   Encounter for loop recorder check 07/26/2021   Heart attack ([place]) 08/01/2020   Heart failure with reduced ejection fraction ([place])    Hyperlipidemia    Hypertension    Loop [employer] [id number] 05/10/2021 05/10/2021   PAD (peripheral artery disease) ([place])    right CIA & right distal iliac artery stents 10/16/20; left AKA 11/06/20   PFO (patent foramen ovale) 12/14/2020   Pneumonia    Pulmonary AV (arteriovenous) fistula ([place]) 12/14/2020   Stroke ([place]) 12/11/2020   no residuial effects    Past Surgical History:  Procedure Laterality Date   ABDOMINAL AORTOGRAM W/LOWER EXTREMITY N/A 10/16/2020   Procedure: ABDOMINAL AORTOGRAM W/LOWER EXTREMITY;  Surgeon: Nigel Mormon, MD;  Location: [place] [facility];  Service: Cardiovascular;  Laterality: N/A;   AMPUTATION Left 11/06/2020   Procedure: AMPUTATION ABOVE KNEE;  Surgeon: Rosetta Posner, MD;  Location: [place];  Service: Vascular;  Laterality: Left;   BUBBLE STUDY  12/14/2020   Procedure: BUBBLE STUDY;  Surgeon:  Nigel Mormon, MD;  Location: [place];  Service: Cardiovascular;;   CARDIAC CATHETERIZATION     CORONARY STENT INTERVENTION N/A 08/03/2020   Procedure: CORONARY STENT INTERVENTION;  Surgeon: Nigel Mormon, MD;  Location: [place] [facility];  Service: Cardiovascular;  Laterality: N/A;   CORONARY/GRAFT ACUTE MI REVASCULARIZATION N/A 08/01/2020   Procedure: Coronary/Graft Acute MI Revascularization;  Surgeon: Nigel Mormon, MD;  Location: [place] [facility];  Service: Cardiovascular;  Laterality: N/A;   INTRAVASCULAR LITHOTRIPSY Right 10/16/2020   Procedure: INTRAVASCULAR LITHOTRIPSY;  Surgeon: Nigel Mormon, MD;  Location: [place] [facility];  Service: Cardiovascular;  Laterality: Right;  Common and External Iliac   IR ANGIOGRAM PULMONARY LEFT SELECTIVE  03/20/2021   IR ANGIOGRAM SELECTIVE EACH ADDITIONAL VESSEL  03/20/2021   IR EMBO ARTERIAL NOT HEMORR HEMANG INC GUIDE ROADMAPPING  03/20/2021   IR RADIOLOGIST EVAL & MGMT  01/30/2021   IR RADIOLOGIST EVAL & MGMT  04/18/2021   IR RADIOLOGIST EVAL & MGMT  07/25/2021   IR US GUIDE VASC ACCESS RIGHT  03/20/2021   LEFT HEART CATH AND CORONARY ANGIOGRAPHY N/A 08/01/2020   Procedure: LEFT HEART CATH AND CORONARY ANGIOGRAPHY;  Surgeon: Nigel Mormon, MD;  Location: [place] [facility];  Service: Cardiovascular;  Laterality: N/A;   LEFT HEART CATH AND CORONARY ANGIOGRAPHY N/A 08/03/2020   Procedure: LEFT HEART CATH AND CORONARY ANGIOGRAPHY;  Surgeon: Nigel Mormon, MD;  Location: [place] [facility];  Service: Cardiovascular;  Laterality: N/A;   PERIPHERAL VASCULAR INTERVENTION Right 10/16/2020   Procedure: PERIPHERAL VASCULAR INTERVENTION;  Surgeon: Nigel Mormon, MD;  Location: [place] [facility];  Service: Cardiovascular;  Laterality: Right;  Common and Iliac Stent   RADIOLOGY WITH ANESTHESIA N/A 03/20/2021   Procedure: PULMONARY EMBOLIZATION;  Surgeon: Aletta Edouard, MD;  Location: [place];  Service: Radiology;   Laterality: N/A;   TEE WITHOUT CARDIOVERSION N/A 12/14/2020   Procedure: TRANSESOPHAGEAL ECHOCARDIOGRAM (TEE);  Surgeon: Nigel Mormon, MD;  [facility]: [facility];  Service: Cardiovascular;  Laterality: N/A;    There were no vitals filed for this visit.   Subjective Assessment - 12/12/21 1015     Subjective He was in pain Tuesday & Wednesday even Cymbalta & Gabapentin.  He wore prosthesis 12hrs straight.    Pertinent History PAD, gangrene s/p L AKA 11/06/20, HTN, CAD s/p STEMI with stenting 08/17/2020, and CHF. cryptogenic stroke Dec 2021    Patient Stated Goals to return to work, (he was released from current job over last weekend but wants to find job).  to walk in community,    Currently in Pain? Yes    Pain Score 4     Pain Location Leg   residual limb   Pain Orientation Left;Distal    Pain Descriptors / Indicators Throbbing;Sore    Pain Type Other (Comment)   prosthetic pain   Pain Onset More than a month ago    Pain Frequency Intermittent    Aggravating Factors  wearing & using prosthesis    Pain Relieving Factors Rx meds & tylenol    Pain Onset More than a month ago                               [facility] Adult PT Treatment/Exercise - 12/12/21 1015       Transfers   Transfers Sit to Stand;Stand to Sit    Sit to Stand 5: Supervision;With upper extremity assist;From chair/3-in-1    Stand to Sit 5: Supervision;With upper extremity assist;To chair/3-in-1      Ambulation/Gait   Ambulation/Gait Yes    Ambulation/Gait Assistance 5: Supervision   right crutch supervision   Ambulation/Gait Assistance Details Worked on proper step width (not abducting) Right crutch prosthetic gait - Initially using line on floot with medial heel 1-2" off line bilaterally,  progressed to 4 correct & 2 abducted still using line, progressed to 4 correct & 2 abducted without line for reference.  Ended ambulated out of clinic with 2 forearm crutches 4 correct & 2 abducted.  pt  reports feeling significant difference with proper step width.    Ambulation Distance (Feet) 100 Feet   100' X 2 right crutch, 100' 2 forearm crutches   Assistive device R Forearm Crutch;Prosthesis;Lofstrands    Ambulation Surface Level;Indoor      High Level Balance   High Level Balance Activities Side stepping;Backward walking   inside //bars (educated on left bar similar to counter or table at home) and right forearm crutch 2 laps & right forearm crutch simulating cane 2 laps   High Level Balance Comments demo, tactile & verbal cues on technique to engage prosthesis & wt shift over prosthesis in stance.      Exercises   Exercises Other Exercises    Other Exercises  reviewed HEP using [place] He verbalized understanding.  Prosthetics   Prosthetic Care Comments  PT discussed with pt that big flucutations in wear time could increase limb pain.  PT recommending 2x/day wear to give his limb a midday break. And only increasing weekly if no significant pain changes.  Start with 5hrs 2x/day.    Current prosthetic wear tolerance (days/week)  daily    Current prosthetic wear tolerance (#hours/day)  5hrs 2x/day    Current prosthetic weight-bearing tolerance (hours/day)  see pain assessment for residual limb pain.    Residual limb condition  reports no issues    Education Provided Proper wear schedule/adjustment;Other (comment)   see prosthetic care comments   Person(s) Educated Patient    Education Method Explanation;Verbal cues    Education Method Verbalized understanding;Verbal cues required;Needs further instruction    Donning Prosthesis Modified independent (device/increased time)                 Balance Exercises - 12/12/21 1015       Balance Exercises: Standing   Standing Eyes Opened Wide (BOA);5 reps;Head turns;Foam/compliant surface   head turns 4 directions   Standing Eyes Opened Limitations mirror, tactile & verbal cues for midline equal weight bearing at midpoint.     Standing Eyes Closed Wide (BOA);Solid surface;Head turns;5 reps   head turns 4 directions   Standing Eyes Closed Limitations visual for midline equal weight prior to & after closing eyes.                  PT Short Term Goals - 12/02/21 1249       PT SHORT TERM GOAL #1   Title Patient reports wearing prosthesis >12 hours 7 days /wk. with residual limb pain </=4/10    Time 1    Period Months    Status On-going    Target Date 12/26/21      PT SHORT TERM GOAL #2   Title Patient ambulates 150' with single forearm crutch & prosthesis with supervision.    Time 1    Period Months    Status On-going    Target Date 12/26/21      PT SHORT TERM GOAL #3   Title Patient able to pick up 5# from floor without UE support safely.    Time 1    Period Months    Status On-going    Target Date 12/26/21      PT SHORT TERM GOAL #4   Title Patient descends stairs with 1 rail & 1 crutch using step over step pattern with supervision.    Time 1    Period Months    Status On-going    Target Date 12/26/21               PT Long Term Goals - 11/28/21 2226       PT LONG TERM GOAL #1   Title Patient demonstrates & verbalized understanding of prosthetic care to enable safe utilization of prosthesis and tolerates wear >90% of awake hours without skin issues or limb pain.    Time 16    Period Weeks    Status On-going    Target Date 03/21/21      PT LONG TERM GOAL #2   Title Timed Up & Go standard with prosthesis only < 20sec and cognitive TUG with prosthesis only < 30 sec.    Time 16    Period Weeks    Status On-going    Target Date 03/21/21      PT LONG TERM GOAL #3  Title [employer] >/= 45/56 to indicate lower fall risk    Time 16    Period Weeks    Status On-going    Target Date 03/21/21      PT LONG TERM GOAL #4   Title Patient ambulates 500' with cane or single forearm crutch & prosthesis modified independent.    Time 16    Period Weeks    Status On-going    Target  Date 03/21/21      PT LONG TERM GOAL #5   Title Patient negotiates ramps, curbs & stairs single rail with cane or single forearm crutch & prosthesis modified independent.    Time 20    Period Weeks    Status On-going    Target Date 03/21/21      PT LONG TERM GOAL #6   Title Gait velocity with single forearm crutch or cane and prosthesis >/= 1.90 ft/sec comfortable pace & >/= 2.40 ft/sec fast pace.    Time 16    Period Weeks    Status On-going    Target Date 03/21/21                   Plan - 12/12/21 1026     Clinical Impression Statement PT worked on sidestepping & backwards gait with proper weight shift to decrease UE support which he improved with instructions.  PT also discussed standing balance in corner for home to improve reactions.    Personal Factors and Comorbidities Comorbidity 3+;Fitness;Time since onset of injury/illness/exacerbation    Comorbidities PAD, gangrene s/p L AKA 11/06/20, HTN, CAD s/p STEMI with stenting 08/17/2020, and CHF. cryptogenic stroke Dec 2021    Examination-Activity Limitations Lift;Locomotion Level;Squat;Stairs;Stand;Transfers;Carry    Examination-Participation Restrictions Community Activity;Occupation    Stability/Clinical Decision Making Evolving/Moderate complexity    Rehab Potential Good    PT Frequency 2x / week    PT Duration Other (comment)   16 weeks   PT Treatment/Interventions ADLs/Self Care Home Management;DME Instruction;Gait training;Stair training;Functional mobility training;Therapeutic activities;Therapeutic exercise;Balance training;Neuromuscular re-education;Patient/family education;Prosthetic Training;Manual techniques;Passive range of motion    PT Next Visit Plan work on standing balance & gait    Consulted and Agree with Plan of Care Patient             Patient will benefit from skilled therapeutic intervention in order to improve the following deficits and impairments:  Decreased activity tolerance, Decreased  balance, Decreased endurance, Decreased knowledge of use of DME, Decreased mobility, Decreased range of motion, Decreased strength, Increased edema, Postural dysfunction, Prosthetic Dependency, Pain, Abnormal gait  Visit Diagnosis: Other abnormalities of gait and mobility  Muscle weakness (generalized)  Unsteadiness on feet  Abnormal posture  Stiffness of left hip, not elsewhere classified     Problem List Patient Active Problem List   Diagnosis Date Noted   Encounter for loop recorder check 07/26/2021   Cryptogenic stroke ([place]) 05/10/2021   Loop [employer] [id number] 05/10/2021 05/10/2021   AVM (arteriovenous malformation) 03/20/2021   Pulmonary AV (arteriovenous) fistula ([place]) 02/20/2021   Pre-ulcerative calluses 12/20/2020   Cerebral thrombosis with cerebral infarction 12/11/2020   Cerebrovascular accident (CVA) due to embolism of cerebral artery ([place]) 12/11/2020   Pressure injury of skin 11/12/2020   Acute urinary retention 11/09/2020   Normal anion gap metabolic acidosis 74/11/8785   Hypotension 11/07/2020   Chest pain 10/31/2020   Acute on chronic HFrEF (heart failure with reduced ejection fraction) ([place])    Goals of care, counseling/discussion    Palliative care by specialist  DNR (do not resuscitate)    Critical limb ischemia with history of revascularization of same extremity ([place]) 10/30/2020   Hyperglycemia 10/30/2020   Tobacco abuse 10/30/2020   Leukocytosis 10/30/2020   Chronic combined systolic and diastolic CHF (congestive heart failure) ([place]) 10/30/2020   Ischemic cardiomyopathy 10/24/2020   AKI (acute kidney injury) ([place]) 10/17/2020   PAD (peripheral artery disease) ([place]) 10/16/2020   Critical lower limb ischemia ([place]) 10/05/2020   Rest pain of left upper extremity due to atherosclerosis ([place]) 10/05/2020   Severe claudication ([place]) 09/20/2020   Paresthesia of both lower extremities 09/20/2020   H/O cardiac arrest 09/20/2020   H/O acute myocardial  infarction 08/14/2020   Snoring 08/14/2020   Sinus pause 08/14/2020   Mixed hyperlipidemia 08/14/2020   Tobacco dependence 08/14/2020   Coronary artery disease involving native coronary artery of native heart without angina pectoris 08/10/2020   HFrEF (heart failure with reduced ejection fraction) ([place]) 08/10/2020   Nonrheumatic mitral valve regurgitation 08/10/2020   STEMI (ST elevation myocardial infarction) ([place]) 08/02/2020   Accelerated hypertension 08/01/2020    Jamey Reas, PT, DPT 12/12/2021, 2:28 PM  [facility] [address] [place], [place], [address] Phone: [phone]   Fax:  [phone]  Name: Mike [name] MRN: [id number] Date of Birth: 1961-11-01

## 2021-12-14 DIAGNOSIS — I639 Cerebral infarction, unspecified: Secondary | ICD-10-CM | POA: Diagnosis not present

## 2021-12-14 DIAGNOSIS — I5042 Chronic combined systolic (congestive) and diastolic (congestive) heart failure: Secondary | ICD-10-CM | POA: Diagnosis not present

## 2021-12-14 DIAGNOSIS — R531 Weakness: Secondary | ICD-10-CM | POA: Diagnosis not present

## 2021-12-16 ENCOUNTER — Other Ambulatory Visit: Payer: Self-pay

## 2021-12-16 ENCOUNTER — Ambulatory Visit: Payer: BC Managed Care – PPO | Admitting: Cardiology

## 2021-12-16 ENCOUNTER — Encounter: Payer: Self-pay | Admitting: Cardiology

## 2021-12-16 VITALS — BP 122/73 | HR 69 | Temp 97.8°F | Resp 16 | Ht 72.0 in | Wt 189.0 lb

## 2021-12-16 DIAGNOSIS — I5020 Unspecified systolic (congestive) heart failure: Secondary | ICD-10-CM

## 2021-12-16 DIAGNOSIS — I739 Peripheral vascular disease, unspecified: Secondary | ICD-10-CM | POA: Diagnosis not present

## 2021-12-16 DIAGNOSIS — I639 Cerebral infarction, unspecified: Secondary | ICD-10-CM

## 2021-12-16 DIAGNOSIS — I455 Other specified heart block: Secondary | ICD-10-CM

## 2021-12-16 DIAGNOSIS — I2510 Atherosclerotic heart disease of native coronary artery without angina pectoris: Secondary | ICD-10-CM | POA: Diagnosis not present

## 2021-12-16 DIAGNOSIS — F17210 Nicotine dependence, cigarettes, uncomplicated: Secondary | ICD-10-CM | POA: Diagnosis not present

## 2021-12-16 DIAGNOSIS — R0683 Snoring: Secondary | ICD-10-CM

## 2021-12-16 DIAGNOSIS — F17200 Nicotine dependence, unspecified, uncomplicated: Secondary | ICD-10-CM

## 2021-12-16 NOTE — Progress Notes (Signed)
Follow up visit  Subjective:   Mike [name], male    DOB: 1961/04/20, 60 y.o.   MRN: [id number]    HPI   Chief Complaint  Patient presents with   HFrEF   Hypertension   Follow-up    3 month    60 y.o. Caucasian male with CAD, culprit (RCA) and nonculprit (LCx) PCI after STEMI 07/2020, HFrEF, moderate MR, h/o cardiac arrest 2/2 Torsades post PCI during index hospitalization, PAD with critical limb ischemia s/p Rt iliac revascularization, and s/p Lt AKA amputation for critical limb ischemia,  severe bilateral renal artery stenoses w/h/o CIN (09/2020), multifocal stroke s/p tPA with resolution of neurodeficit (11/2020), large AV pulmonary fistula-s/p closure by Dr. Kathlene Cote (02/2021), small PFO.  Patient underwent ILR placement given cryptogenic stroke (04/2021). He had post procedural bleeding requiring additional sutures. ILR has showed asymptomatic sinus pauses up to 5 sec during sleep.   He now has left leg prosthesis.  Phantom pain has improved. He is now walking with crutches and working diligently with physical therapy.  He denies chest pain, shortness of breath, palpitations, leg edema, orthopnea, PND, TIA/syncope.   Current Outpatient Medications on File Prior to Visit  Medication Sig Dispense Refill   acetaminophen (TYLENOL) 500 MG tablet Take 500-1,000 mg by mouth every 6 (six) hours as needed (pain).     aspirin EC 81 MG tablet Take 1 tablet (81 mg total) by mouth daily. Swallow whole. 90 tablet 3   DULoxetine (CYMBALTA) 30 MG capsule TAKE 1 CAPSULE (30 MG TOTAL) BY MOUTH DAILY. 90 capsule 2   DULoxetine (CYMBALTA) 30 MG capsule Take 1 capsule (30 mg total) by mouth daily. Qam 30 capsule 3   ezetimibe (ZETIA) 10 MG tablet Take 1 tablet (10 mg total) by mouth daily. 90 tablet 3   furosemide (LASIX) 20 MG tablet TAKE 1 TABLET BY MOUTH EVERY DAY 90 tablet 1   gabapentin (NEURONTIN) 300 MG capsule Take 1 capsule (300 mg total) by mouth 2 (two) times daily. (Patient taking  differently: Take 300 mg by mouth in the morning and at bedtime.) 60 capsule 0   metoprolol succinate (TOPROL-XL) 25 MG 24 hr tablet Take 1 tablet (25 mg total) by mouth daily. Take with or immediately following a meal. (Patient taking differently: Take 25 mg by mouth every evening. Take with or immediately following a meal.) 90 tablet 3   Multiple Vitamins-Minerals (MULTI FOR HIM 50+) TABS Take 1 tablet by mouth See admin instructions.     nitroGLYCERIN (NITROSTAT) 0.4 MG SL tablet Place 1 tablet (0.4 mg total) under the tongue every 5 (five) minutes as needed for chest pain. (Patient taking differently: Place 0.4 mg under the tongue every 5 (five) minutes as needed for chest pain (max 3 doses).) 30 tablet 1   rivaroxaban (XARELTO) 2.5 MG TABS tablet Take 1 tablet (2.5 mg total) by mouth 2 (two) times daily. 180 tablet 3   rosuvastatin (CRESTOR) 40 MG tablet Take 1 tablet (40 mg total) by mouth every evening. 90 tablet 3   sacubitril-valsartan (ENTRESTO) 49-51 MG Take 1 tablet by mouth 2 (two) times daily. 180 tablet 2   No current facility-administered medications on file prior to visit.    Cardiovascular & other pertient studies:  Remote loop recorder transmission 12/12/2021:  Predominant rhythm is normal sinus rhythm.  There was no atrial fibrillation.  There were frequent atrial monitoring episodes = PVCs.  No symptoms reported.  EKG 09/20/2021: Sinus rhythm 66 bpm Old inferior  infarct Old anterior infarct  Echocardiogram 09/10/2021:  Left ventricle cavity is mildly dilated. Mild basal septal hypertrophy.  Mild basal inferolateral hypokinesis. LVEF 40-45%. Doppler evidence of  grade I (impaired) diastolic dysfunction, normal LAP. Moderate (Grade II)  mitral regurgitation.  Normal right atrial pressure.  Previous study on 01/21/2021 noted LVEF 35-40%, mod to severe MR, mild TR.  Remote loop recorder transmission 09/10/2021: NSR.Pauses at night while patient asleep and asymptomatic. No  A. Fib.   EKG 03/20/2021: Sinus rhythm 66 bpm Old inferior infarct  Mobile cardiac telemetry 14 days 12/20/2020 - 01/03/2021:  Dominant rhythm: Sinus.  HR 31-167 bpm. Avg HR 76 bpm.  14 episodes of SVT/atrial tachycardia, fastest at 167 bpm, longest for 18.7 sec  <1%% isolated SVE, couplet/triplets.  6 episodes of VT/idioventricular rhythm, fastest at 156 bpm, longest for 10 beats  8% isolated VE, 40 sec bigeminy, 10m17 sec trigeminy.  2 episodes of sinus pauses,  4-4.6 sec noted during sleep hours.  No atrial fibrillation/atrial flutter//high grade AV block  0 patient triggered events.   CTA Chest 12/14/2020: 1. Large 3.1 x 1.9 cm pulmonary arteriovenous fistula in the medial left lower lobe. 2. Right middle lobe 3 mm solid pulmonary nodule. Follow-up noncontrast chest CT recommended in 12 months in this high risk patient.This recommendation follows the consensus statement: Guidelines for Management of Incidental Pulmonary Nodules Detected on CT Images: From the Fleischner Society 2017; Radiology 2017; 284:228-243. 3. Mild cardiomegaly. 4. Aortic Atherosclerosis (ICD10-I70.0) and Emphysema (ICD10-J43.9).    PV intervention 10/16/2020: Successful intravascular lithotripsy, PTCA and stenting     7.0X39 mm balloon expandable Viabahn VBX stent Rt common iliac artery      7.0X60 mm self expanding Absolute Pro stent Rt distal iliac artery   No named vessels at ankle level in both lower extremities.  Will consult vascular surgery for Rt fem-to-left profunda bypass given left foot critical limb ischemia.   Abdominal Aortic Duplex  09/21/2020:  Moderate plaque noted in the proximal, mid and distal aorta. There is an  ulcerated plaque noted in the distal abdominal aorta. No AAA. Normal iliac  artery velocity.   Lower Extremity Arterial Duplex 09/21/2020:  The right SFA is occluded in the proximal segment with reconstitution at  the level of the popliteal artery with diffuse  monophasic waveform below  the knee. Right profunda femoral artery has >50% stenosis. There is  moderate mixed plaque noted throughout the right lower extremity.  Monophasic waveform throughout the left lower extremity, indicates  significant proximal disease (iliac artery).   Left SFA is occluded in the proximal segment and reconstitutes just above popliteal artery and diffuse dampened monophasic waveform throughout the lower extremity below the  knee. There is moderate mixed plaque throughout the left lower extremity.   This exam reveals severely decreased perfusion of the right lower  extremity, noted at the dorsalis pedis artery level (ABI 0.42) and  critically decreased perfusion of the left lower extremity, noted at the  dorsalis pedis and post tibial artery level (ABI 0.03).   Coronary intervention 08/03/2020: LM: Distal 40% stenosis LAD: Mid 30% disease LCx: Subtotally occluded OM2, prox LCx 70% stenosis        Successful percutaneous coronary intervention OM2-Prox LCx        PTCA and overlapping stents placement         2.5 X 38 mm and 2.5 X 18 mm Resolute Onyx drug-eluting stents        100%--->0% stenosis. TIMI flow 0-->III  Small caliber distal vessel with moderate diffuse disease RCA: Prox 30% stenosis.         Patent mid RCA sttent 2.5 X 26 mm Resolute Onyx drug-eluting stent   LVEDP 42 mmHg   Coronary intervention 08/01/2020: LM: Distal 30% stenosis LAD: Mid 30% disease LCx: Subtotally occluded OM2, bridging left-to-left and right-to-left collaterals RCA: Prox 30% stenosis.        Mid 100% occlusion        Successful percutaneous coronary intervention mid RCA        PTCA and stent placement 2.5 X 26 mm Resolute Onyx drug-eluting stent 100%--->0% stenosis. TIMI flow 0-->III     Recent labs: 12/06/2021: Chol 127, TG 215, HDL 37, LDL 55  09/04/2021: Glucose 90, BUN/Cr 19/1.21. EGFR 68. Na/K 142/4.1.  Chol 150, TG 203, HDL 41, LDL 75  03/20/2021: Glucose 85,  BUN/Cr 19/1.05. EGFR >60. Na/K 139/3.8. Rest of the CMP normal H/H 13/42. MCV 84. Platelets 212   01/31/2021: Glucose 118, BUN/Cr 22/1.0. EGFR 81. Na/K 145/3.8.   12/14/2020: Glucose 100, BUN/Cr 13/1.09. EGFR >60. Na/K 140/3.9.  H/H 11.6/35.5. MCV 90.6. Platelets 202 HbA1C 4.6% Chol 130, TG 71, HDL 42, LDL 74  09/25/2020: Glucose 102, BUN/Cr 18/1.16. EGFR 68. Na/K 140/4.5.  NT pro BNP 2404  09/11/2020: Glucose 114, BUN/Cr 19/1.42. EGFR 53. Na/K 136/4.7.  Chol 95, TG 76, HDL 30, LDL 49  08/04/2020: Glucose 101, BUN/Cr 19/1.08. EGFR >60. Na/K 139/4.0. Rest of the CMP normal H/H 14/42. MCV 89. Platelets 184. WBC count 14k HbA1C 5.8% Chol 178, TG 75, HDL 40, LDL 123    Review of Systems  Cardiovascular:  Negative for chest pain, dyspnea on exertion, leg swelling, palpitations and syncope.  Musculoskeletal:        Left leg phantom pain         Vitals:   12/16/21 1026  BP: 122/73  Pulse: 69  Resp: 16  Temp: 97.8 F (36.6 C)  SpO2: 99%     Body mass index is 25.63 kg/m. Filed Weights   12/16/21 1026  Weight: 189 lb (85.7 kg)     Objective:   Physical Exam Neck:     Vascular: No JVD.  Cardiovascular:     Rate and Rhythm: Normal rate and regular rhythm.     Pulses:          Dorsalis pedis pulses are 0 on the right side.       Posterior tibial pulses are 0 on the right side.     Heart sounds: No murmur heard. Musculoskeletal:     Right lower leg: No edema.     Comments: S/p left AKA Now has prosthesis          Assessment & Recommendations:   60 y.o. Caucasian male with CAD, culprit (RCA) and nonculprit (LCx) PCI after STEMI 07/2020, HFrEF, moderate MR, h/o cardiac arrest 2/2 Torsades post PCI during index hospitalization, PAD with critical limb ischemia s/p Rt iliac revascularization, and s/p Lt AKA amputation for critical limb ischemia,  severe bilateral renal artery stenoses w/h/o CIN (09/2020), stroke s/p tPA with resolution of neurodeficit (11/2020),  multifocal stroke on MRI suspicious for cardioembolic source, small PFO/secundum ASD and large pulmonary AV fistula now s/p closure by Dr. Kathlene Cote (02/2021), ILR placement (04/2021), asymptomatic isnus pauses  Asymptomatic sinus pauses: Likely due to OSA. He is not interested in sleep testing.  Stroke: Multifocal stroke with small PFO/secundum ASD but large pulmonary AV fistula (11/2020), now s/p closure by Dr.  Kathlene Cote (02/2021) Repeat TCD bubble study as pr Dr. Clydene Fake recommendations S/p ILR placement (04/2021). No Afib detected thus far  PAD: Successful revascularization of right common iliac and right external iliac artery (09/2020) S/p Lt AKA.  No critical limb ischemia, although he has pre-ulcerative calloses on right sole.  Continue Aspirin 81 mg daily and Xarelto 2.5 mg bid.  HFrEF, secondary mitral regurgitation: Euvolumic. EF 40-45% (07/2021). No ICD indicated. Continue Entresto 24-26 mg bid, metoprolol succinate 25 mg daily. Recheck echocardiogram in 05/2022.  CAD: S/p STEMI 07/2020. Currently no angina symptoms On plavix 75 mg and Xarelto 2.5 mg bid. Will change to Aspirin 81 mg daily and Xarelto 2.5 mg bid. Continue Crestor 40 mg, Zetia 10 mg daily. With this, LDL is down to 55 (11/2021).  Nicotine dependence: Tobacco cessation counseling:  - Currently smoking 1/4 packs/day   - Patient was informed of the dangers of tobacco abuse including stroke, cancer, and MI, as well as benefits of tobacco cessation. - Patient is willing to quit at this time. - Approximately 5 mins were spent counseling patient cessation techniques. We discussed various methods to help quit smoking, including deciding on a date to quit, joining a support group, pharmacological agents. Patient would like to use nicotine gum. - I will reassess his progress at the next follow-up visit   F/u in 6 months    Nigel Mormon, MD Pager: [phone] Office: [phone]

## 2021-12-16 NOTE — Progress Notes (Signed)
Loop transmission 12/12/21 NSR. Atrial monitoring episodes = false. PVC.

## 2021-12-17 ENCOUNTER — Encounter: Payer: Self-pay | Admitting: Physical Therapy

## 2021-12-17 ENCOUNTER — Ambulatory Visit (INDEPENDENT_AMBULATORY_CARE_PROVIDER_SITE_OTHER): Payer: BC Managed Care – PPO | Admitting: Physical Therapy

## 2021-12-17 DIAGNOSIS — G546 Phantom limb syndrome with pain: Secondary | ICD-10-CM

## 2021-12-17 DIAGNOSIS — M79605 Pain in left leg: Secondary | ICD-10-CM

## 2021-12-17 DIAGNOSIS — R293 Abnormal posture: Secondary | ICD-10-CM | POA: Diagnosis not present

## 2021-12-17 DIAGNOSIS — R2689 Other abnormalities of gait and mobility: Secondary | ICD-10-CM | POA: Diagnosis not present

## 2021-12-17 DIAGNOSIS — R2681 Unsteadiness on feet: Secondary | ICD-10-CM | POA: Diagnosis not present

## 2021-12-17 DIAGNOSIS — M6281 Muscle weakness (generalized): Secondary | ICD-10-CM | POA: Diagnosis not present

## 2021-12-17 DIAGNOSIS — M25652 Stiffness of left hip, not elsewhere classified: Secondary | ICD-10-CM

## 2021-12-17 NOTE — Therapy (Signed)
[facility] Physical Therapy [address] [place], [place], [address] Phone: [phone]   Fax:  [phone]  Physical Therapy Treatment  Patient Details  Name: Mike [name] MRN: [id number] Date of Birth: September 03, 1961 Referring Provider (PT): Meridee Score, MD   Encounter Date: 12/17/2021   PT End of Session - 12/17/21 1045     Visit Number 24    Number of Visits 13    Date for PT Re-Evaluation 02/20/22    Authorization Type BCBS    Authorization Time Period OOP & Deductible MET for 2022    PT Start Time 1045    PT Stop Time 1134    PT Time Calculation (min) 49 min    Equipment Utilized During Treatment Gait belt    Activity Tolerance Patient tolerated treatment well;Patient limited by pain    Behavior During Therapy [facility] for tasks assessed/performed             Past Medical History:  Diagnosis Date   Anxiety    CAD (coronary artery disease)    Cryptogenic stroke ([place]) 05/10/2021   Encounter for loop recorder check 07/26/2021   Heart attack ([place]) 08/01/2020   Heart failure with reduced ejection fraction ([place])    Hyperlipidemia    Hypertension    Loop [employer] [id number] 05/10/2021 05/10/2021   PAD (peripheral artery disease) ([place])    right CIA & right distal iliac artery stents 10/16/20; left AKA 11/06/20   PFO (patent foramen ovale) 12/14/2020   Pneumonia    Pulmonary AV (arteriovenous) fistula ([place]) 12/14/2020   Stroke ([place]) 12/11/2020   no residuial effects    Past Surgical History:  Procedure Laterality Date   ABDOMINAL AORTOGRAM W/LOWER EXTREMITY N/A 10/16/2020   Procedure: ABDOMINAL AORTOGRAM W/LOWER EXTREMITY;  Surgeon: Nigel Mormon, MD;  Location: [place] [facility];  Service: Cardiovascular;  Laterality: N/A;   AMPUTATION Left 11/06/2020   Procedure: AMPUTATION ABOVE KNEE;  Surgeon: Rosetta Posner, MD;  Location: [place];  Service: Vascular;  Laterality: Left;   BUBBLE STUDY  12/14/2020   Procedure: BUBBLE STUDY;  Surgeon:  Nigel Mormon, MD;  Location: [place];  Service: Cardiovascular;;   CARDIAC CATHETERIZATION     CORONARY STENT INTERVENTION N/A 08/03/2020   Procedure: CORONARY STENT INTERVENTION;  Surgeon: Nigel Mormon, MD;  Location: [place] [facility];  Service: Cardiovascular;  Laterality: N/A;   CORONARY/GRAFT ACUTE MI REVASCULARIZATION N/A 08/01/2020   Procedure: Coronary/Graft Acute MI Revascularization;  Surgeon: Nigel Mormon, MD;  Location: [place] [facility];  Service: Cardiovascular;  Laterality: N/A;   INTRAVASCULAR LITHOTRIPSY Right 10/16/2020   Procedure: INTRAVASCULAR LITHOTRIPSY;  Surgeon: Nigel Mormon, MD;  Location: [place] [facility];  Service: Cardiovascular;  Laterality: Right;  Common and External Iliac   IR ANGIOGRAM PULMONARY LEFT SELECTIVE  03/20/2021   IR ANGIOGRAM SELECTIVE EACH ADDITIONAL VESSEL  03/20/2021   IR EMBO ARTERIAL NOT HEMORR HEMANG INC GUIDE ROADMAPPING  03/20/2021   IR RADIOLOGIST EVAL & MGMT  01/30/2021   IR RADIOLOGIST EVAL & MGMT  04/18/2021   IR RADIOLOGIST EVAL & MGMT  07/25/2021   IR US GUIDE VASC ACCESS RIGHT  03/20/2021   LEFT HEART CATH AND CORONARY ANGIOGRAPHY N/A 08/01/2020   Procedure: LEFT HEART CATH AND CORONARY ANGIOGRAPHY;  Surgeon: Nigel Mormon, MD;  Location: [place] [facility];  Service: Cardiovascular;  Laterality: N/A;   LEFT HEART CATH AND CORONARY ANGIOGRAPHY N/A 08/03/2020   Procedure: LEFT HEART CATH AND CORONARY ANGIOGRAPHY;  Surgeon: Nigel Mormon, MD;  Location: [place] [facility];  Service: Cardiovascular;  Laterality: N/A;   PERIPHERAL VASCULAR INTERVENTION Right 10/16/2020   Procedure: PERIPHERAL VASCULAR INTERVENTION;  Surgeon: Nigel Mormon, MD;  Location: [place] [facility];  Service: Cardiovascular;  Laterality: Right;  Common and Iliac Stent   RADIOLOGY WITH ANESTHESIA N/A 03/20/2021   Procedure: PULMONARY EMBOLIZATION;  Surgeon: Aletta Edouard, MD;  Location: [place];  Service: Radiology;   Laterality: N/A;   TEE WITHOUT CARDIOVERSION N/A 12/14/2020   Procedure: TRANSESOPHAGEAL ECHOCARDIOGRAM (TEE);  Surgeon: Nigel Mormon, MD;  Location: [facility];  Service: Cardiovascular;  Laterality: N/A;    There were no vitals filed for this visit.   Subjective Assessment - 12/17/21 1045     Subjective He wore prosthesis 5hrs 2x/day except 9hrs straight yesterday due to MD appt. His limb pain appears more controlled.    Pertinent History PAD, gangrene s/p L AKA 11/06/20, HTN, CAD s/p STEMI with stenting 08/17/2020, and CHF. cryptogenic stroke Dec 2021    Patient Stated Goals to return to work, (he was released from current job over last weekend but wants to find job).  to walk in community,    Currently in Pain? Yes    Pain Score 2    2.5/10   Pain Location Leg    Pain Orientation Left;Distal    Pain Descriptors / Indicators Throbbing;Sore    Pain Type Other (Comment)   prosthetic pain   Pain Onset More than a month ago    Pain Frequency Intermittent    Aggravating Factors  wearing & using prosthesis    Pain Relieving Factors Rx meds & tylenol    Effect of Pain on Daily Activities slow increase in wear    Pain Onset More than a month ago                               [facility] Adult PT Treatment/Exercise - 12/17/21 1046       Transfers   Transfers Sit to Stand;Stand to Sit    Sit to Stand 5: Supervision;With upper extremity assist;From chair/3-in-1    Stand to Sit 5: Supervision;With upper extremity assist;To chair/3-in-1      Ambulation/Gait   Ambulation/Gait Yes    Ambulation/Gait Assistance 5: Supervision   right crutch supervision   Ambulation/Gait Assistance Details working on weight forward over prosthesis in stance & advancing with pelvis ASIS.    Ambulation Distance (Feet) 100 Feet   100' X 2 right crutch, 100' 2 forearm crutches   Assistive device R Forearm Crutch;Prosthesis;Lofstrands    Ambulation Surface Level;Indoor    Pre-Gait  Activities using 3 pieces tape on floot as markers for equal step length - advancing prosthesis with left ASIS so weight closer to heel at initial contact, wt shifting over prosthesis in stance with right heel rise    Gait Comments progressed to ambulating around table LUE initial support progressed to intermittent & RUE cane stand alone tip.  Tried std tip near end but unsteady.      High Level Balance   High Level Balance Activities Side stepping;Backward walking   inside //bars (educated on left bar similar to counter or table at home) and right forearm crutch 2 laps & right forearm crutch simulating cane 2 laps   High Level Balance Comments demo, tactile & verbal cues on technique to engage prosthesis & wt shift over prosthesis in stance.  Exercises   Exercises Other Exercises    Other Exercises  --      Prosthetics   Prosthetic Care Comments  Increase wear to 6hrs 2x/day initiating morning wear upon arising & trying to time second wear removing at bedtime.    Current prosthetic wear tolerance (days/week)  daily    Current prosthetic wear tolerance (#hours/day)  5hrs 2x/day    Current prosthetic weight-bearing tolerance (hours/day)  see pain assessment for residual limb pain.    Residual limb condition  reports no issues    Education Provided Proper wear schedule/adjustment;Other (comment)   see prosthetic care comments   Person(s) Educated Patient    Education Method Explanation;Verbal cues    Education Method Verbalized understanding;Verbal cues required;Needs further instruction    Donning Prosthesis Modified independent (device/increased time)                       PT Short Term Goals - 12/02/21 1249       PT SHORT TERM GOAL #1   Title Patient reports wearing prosthesis >12 hours 7 days /wk. with residual limb pain </=4/10    Time 1    Period Months    Status On-going    Target Date 12/26/21      PT SHORT TERM GOAL #2   Title Patient ambulates 150' with  single forearm crutch & prosthesis with supervision.    Time 1    Period Months    Status On-going    Target Date 12/26/21      PT SHORT TERM GOAL #3   Title Patient able to pick up 5# from floor without UE support safely.    Time 1    Period Months    Status On-going    Target Date 12/26/21      PT SHORT TERM GOAL #4   Title Patient descends stairs with 1 rail & 1 crutch using step over step pattern with supervision.    Time 1    Period Months    Status On-going    Target Date 12/26/21               PT Long Term Goals - 11/28/21 2226       PT LONG TERM GOAL #1   Title Patient demonstrates & verbalized understanding of prosthetic care to enable safe utilization of prosthesis and tolerates wear >90% of awake hours without skin issues or limb pain.    Time 16    Period Weeks    Status On-going    Target Date 03/21/21      PT LONG TERM GOAL #2   Title Timed Up & Go standard with prosthesis only < 20sec and cognitive TUG with prosthesis only < 30 sec.    Time 16    Period Weeks    Status On-going    Target Date 03/21/21      PT LONG TERM GOAL #3   Title Berg Balance >/= 45/56 to indicate lower fall risk    Time 16    Period Weeks    Status On-going    Target Date 03/21/21      PT LONG TERM GOAL #4   Title Patient ambulates 500' with cane or single forearm crutch & prosthesis modified independent.    Time 16    Period Weeks    Status On-going    Target Date 03/21/21      PT LONG TERM GOAL #5   Title Patient negotiates ramps,  curbs & stairs single rail with cane or single forearm crutch & prosthesis modified independent.    Time 20    Period Weeks    Status On-going    Target Date 03/21/21      PT LONG TERM GOAL #6   Title Gait velocity with single forearm crutch or cane and prosthesis >/= 1.90 ft/sec comfortable pace & >/= 2.40 ft/sec fast pace.    Time 16    Period Weeks    Status On-going    Target Date 03/21/21                   Plan -  12/17/21 1045     Clinical Impression Statement Pt improved forward wt shift over prosthesis in stance & advancing prosthesis so easier weight acceptance immediately after initial contact. He does reqiure BUE support for full focus but able to perform short distances with right forearm crutch.    Personal Factors and Comorbidities Comorbidity 3+;Fitness;Time since onset of injury/illness/exacerbation    Comorbidities PAD, gangrene s/p L AKA 11/06/20, HTN, CAD s/p STEMI with stenting 08/17/2020, and CHF. cryptogenic stroke Dec 2021    Examination-Activity Limitations Lift;Locomotion Level;Squat;Stairs;Stand;Transfers;Carry    Examination-Participation Restrictions Community Activity;Occupation    Stability/Clinical Decision Making Evolving/Moderate complexity    Rehab Potential Good    PT Frequency 2x / week    PT Duration Other (comment)   16 weeks   PT Treatment/Interventions ADLs/Self Care Home Management;DME Instruction;Gait training;Stair training;Functional mobility training;Therapeutic activities;Therapeutic exercise;Balance training;Neuromuscular re-education;Patient/family education;Prosthetic Training;Manual techniques;Passive range of motion    PT Next Visit Plan work on stairs alternating pattern desceding, work on standing balance & gait    Consulted and Agree with Plan of Care Patient             Patient will benefit from skilled therapeutic intervention in order to improve the following deficits and impairments:  Decreased activity tolerance, Decreased balance, Decreased endurance, Decreased knowledge of use of DME, Decreased mobility, Decreased range of motion, Decreased strength, Increased edema, Postural dysfunction, Prosthetic Dependency, Pain, Abnormal gait  Visit Diagnosis: Other abnormalities of gait and mobility  Muscle weakness (generalized)  Unsteadiness on feet  Abnormal posture  Stiffness of left hip, not elsewhere classified  Pain in left leg  Phantom limb  syndrome with pain [facility])     Problem List Patient Active Problem List   Diagnosis Date Noted   Encounter for loop recorder check 07/26/2021   Cryptogenic stroke ([place]) 05/10/2021   Loop [employer]otronic [id number] 05/10/2021 05/10/2021   AVM (arteriovenous malformation) 03/20/2021   Pulmonary AV (arteriovenous) fistula ([place]) 02/20/2021   Pre-ulcerative calluses 12/20/2020   Cerebral thrombosis with cerebral infarction 12/11/2020   Cerebrovascular accident (CVA) due to embolism of cerebral artery ([place]) 12/11/2020   Pressure injury of skin 11/12/2020   Acute urinary retention 11/09/2020   Normal anion gap metabolic acidosis 73/41/9379   Hypotension 11/07/2020   Chest pain 10/31/2020   Acute on chronic HFrEF (heart failure with reduced ejection fraction) ([place])    Goals of care, counseling/discussion    Palliative care by specialist    DNR (do not resuscitate)    Critical limb ischemia with history of revascularization of same extremity ([place]) 10/30/2020   Hyperglycemia 10/30/2020   Tobacco abuse 10/30/2020   Leukocytosis 10/30/2020   Chronic combined systolic and diastolic CHF (congestive heart failure) ([place]) 10/30/2020   Ischemic cardiomyopathy 10/24/2020   AKI (acute kidney injury) ([place]) 10/17/2020   PAD (peripheral artery disease) ([place]) 10/16/2020   Critical  lower limb ischemia ([place]) 10/05/2020   Rest pain of left upper extremity due to atherosclerosis ([place]) 10/05/2020   Severe claudication ([place]) 09/20/2020   Paresthesia of both lower extremities 09/20/2020   H/O cardiac arrest 09/20/2020   H/O acute myocardial infarction 08/14/2020   Snoring 08/14/2020   Sinus pause 08/14/2020   Mixed hyperlipidemia 08/14/2020   Tobacco dependence 08/14/2020   Coronary artery disease involving native coronary artery of native heart without angina pectoris 08/10/2020   HFrEF (heart failure with reduced ejection fraction) ([place]) 08/10/2020   Nonrheumatic mitral valve regurgitation 08/10/2020   STEMI  (ST elevation myocardial infarction) ([place]) 08/02/2020   Accelerated hypertension 08/01/2020    Jamey Reas, PT, DPT 12/17/2021, 11:44 AM  [facility] Physical Therapy [address] [place], [place], [address] Phone: [phone]   Fax:  [phone]  Name: Mike [name] MRN: [id number] Date of Birth: Jul 03, 1961

## 2021-12-19 ENCOUNTER — Ambulatory Visit (INDEPENDENT_AMBULATORY_CARE_PROVIDER_SITE_OTHER): Payer: BC Managed Care – PPO | Admitting: Physical Therapy

## 2021-12-19 ENCOUNTER — Other Ambulatory Visit: Payer: Self-pay

## 2021-12-19 ENCOUNTER — Encounter: Payer: Self-pay | Admitting: Physical Therapy

## 2021-12-19 DIAGNOSIS — R2689 Other abnormalities of gait and mobility: Secondary | ICD-10-CM | POA: Diagnosis not present

## 2021-12-19 DIAGNOSIS — M6281 Muscle weakness (generalized): Secondary | ICD-10-CM | POA: Diagnosis not present

## 2021-12-19 DIAGNOSIS — M79605 Pain in left leg: Secondary | ICD-10-CM

## 2021-12-19 DIAGNOSIS — R2681 Unsteadiness on feet: Secondary | ICD-10-CM

## 2021-12-19 DIAGNOSIS — R293 Abnormal posture: Secondary | ICD-10-CM | POA: Diagnosis not present

## 2021-12-19 DIAGNOSIS — M25652 Stiffness of left hip, not elsewhere classified: Secondary | ICD-10-CM

## 2021-12-19 DIAGNOSIS — G546 Phantom limb syndrome with pain: Secondary | ICD-10-CM

## 2021-12-19 NOTE — Therapy (Signed)
[facility] [address] [place], [place], [address] Phone: [phone]   Fax:  [phone]  Physical Therapy Treatment  Patient Details  Name: Mike [name] MRN: [id number] Date of Birth: June 28, 1961 Referring Provider (PT): Meridee Score, MD   Encounter Date: 12/19/2021   PT End of Session - 12/19/21 1422     Visit Number 25    Number of Visits 54    Date for PT Re-Evaluation 02/20/22    Authorization Type BCBS    Authorization Time Period OOP & Deductible MET for 2022    PT Start Time 1145    PT Stop Time 1230    PT Time Calculation (min) 45 min    Equipment Utilized During Treatment Gait belt    Activity Tolerance Patient tolerated treatment well;Patient limited by pain    Behavior During Therapy [facility] for tasks assessed/performed             Past Medical History:  Diagnosis Date   Anxiety    CAD (coronary artery disease)    Cryptogenic stroke ([place]) 05/10/2021   Encounter for loop recorder check 07/26/2021   Heart attack ([place]) 08/01/2020   Heart failure with reduced ejection fraction ([place])    Hyperlipidemia    Hypertension    Loop Biotronic [id number] 05/10/2021 05/10/2021   PAD (peripheral artery disease) ([place])    right CIA & right distal iliac artery stents 10/16/20; left AKA 11/06/20   PFO (patent foramen ovale) 12/14/2020   Pneumonia    Pulmonary AV (arteriovenous) fistula ([place]) 12/14/2020   Stroke ([place]) 12/11/2020   no residuial effects    Past Surgical History:  Procedure Laterality Date   ABDOMINAL AORTOGRAM W/LOWER EXTREMITY N/A 10/16/2020   Procedure: ABDOMINAL AORTOGRAM W/LOWER EXTREMITY;  Surgeon: Nigel Mormon, MD;  Location: [place] [facility];  Service: Cardiovascular;  Laterality: N/A;   AMPUTATION Left 11/06/2020   Procedure: AMPUTATION ABOVE KNEE;  Surgeon: Rosetta Posner, MD;  Location: [place];  Service: Vascular;  Laterality: Left;   BUBBLE STUDY  12/14/2020   Procedure: BUBBLE STUDY;  Surgeon:  Nigel Mormon, MD;  Location: [place];  Service: Cardiovascular;;   CARDIAC CATHETERIZATION     CORONARY STENT INTERVENTION N/A 08/03/2020   Procedure: CORONARY STENT INTERVENTION;  Surgeon: Nigel Mormon, MD;  Location: [place] [facility];  Service: Cardiovascular;  Laterality: N/A;   CORONARY/GRAFT ACUTE MI REVASCULARIZATION N/A 08/01/2020   Procedure: Coronary/Graft Acute MI Revascularization;  Surgeon: Nigel Mormon, MD;  Location: [place] [facility];  Service: Cardiovascular;  Laterality: N/A;   INTRAVASCULAR LITHOTRIPSY Right 10/16/2020   Procedure: INTRAVASCULAR LITHOTRIPSY;  Surgeon: Nigel Mormon, MD;  Location: [place] [facility];  Service: Cardiovascular;  Laterality: Right;  Common and External Iliac   IR ANGIOGRAM PULMONARY LEFT SELECTIVE  03/20/2021   IR ANGIOGRAM SELECTIVE EACH ADDITIONAL VESSEL  03/20/2021   IR EMBO ARTERIAL NOT HEMORR HEMANG INC GUIDE ROADMAPPING  03/20/2021   IR RADIOLOGIST EVAL & MGMT  01/30/2021   IR RADIOLOGIST EVAL & MGMT  04/18/2021   IR RADIOLOGIST EVAL & MGMT  07/25/2021   IR US GUIDE VASC ACCESS RIGHT  03/20/2021   LEFT HEART CATH AND CORONARY ANGIOGRAPHY N/A 08/01/2020   Procedure: LEFT HEART CATH AND CORONARY ANGIOGRAPHY;  Surgeon: Nigel Mormon, MD;  Location: [place] [facility];  Service: Cardiovascular;  Laterality: N/A;   LEFT HEART CATH AND CORONARY ANGIOGRAPHY N/A 08/03/2020   Procedure: LEFT HEART CATH AND CORONARY ANGIOGRAPHY;  Surgeon: Nigel Mormon, MD;  Location: [place] [facility];  Service: Cardiovascular;  Laterality: N/A;   PERIPHERAL VASCULAR INTERVENTION Right 10/16/2020   Procedure: PERIPHERAL VASCULAR INTERVENTION;  Surgeon: Nigel Mormon, MD;  Location: [place] [facility];  Service: Cardiovascular;  Laterality: Right;  Common and Iliac Stent   RADIOLOGY WITH ANESTHESIA N/A 03/20/2021   Procedure: PULMONARY EMBOLIZATION;  Surgeon: Aletta Edouard, MD;  Location: [place];  Service: Radiology;   Laterality: N/A;   TEE WITHOUT CARDIOVERSION N/A 12/14/2020   Procedure: TRANSESOPHAGEAL ECHOCARDIOGRAM (TEE);  Surgeon: Nigel Mormon, MD;  Location[facility];  Service: Cardiovascular;  Laterality: N/A;    There were no vitals filed for this visit.   Subjective Assessment - 12/19/21 1145     Subjective He wore prosthesis yesterday for 6hrs 2x/day initiating morning wear within 30 min of arising, off ~3hrs between wears & ending 2nd wear near bed time. No increase in residual limb pain.    Pertinent History PAD, gangrene s/p L AKA 11/06/20, HTN, CAD s/p STEMI with stenting 08/17/2020, and CHF. cryptogenic stroke Dec 2021    Patient Stated Goals to return to work, (he was released from current job over last weekend but wants to find job).  to walk in community,    Currently in Pain? Yes    Pain Score 2     Pain Location Leg   residual limb   Pain Orientation Left;Distal    Pain Descriptors / Indicators Throbbing;Sore    Pain Onset More than a month ago    Pain Frequency Intermittent    Aggravating Factors  wearing & weightbearing on prosthesis.    Pain Relieving Factors Rx meds & tylenol    Effect of Pain on Daily Activities slow controlled increase in wear    Pain Onset More than a month ago                               [facility] Adult PT Treatment/Exercise - 12/19/21 1145       Transfers   Transfers Sit to Stand;Stand to Sit    Sit to Stand 5: Supervision;With upper extremity assist;From chair/3-in-1    Stand to Sit 5: Supervision;With upper extremity assist;To chair/3-in-1      Ambulation/Gait   Ambulation/Gait Yes    Ambulation/Gait Assistance 5: Supervision   right crutch supervision   Ambulation Distance (Feet) 100 Feet   100' X 2 right crutch, 100' 2 forearm crutches   Assistive device R Forearm Crutch;Prosthesis;Lofstrands    Stairs Assistance 5: Supervision    Stairs Assistance Details (indicate cue type and reason) PT demo, verbal & tactile  cues on technique. Progressed from Bil. rails to single crutch & rail.    Stair Management Technique Two rails;One rail Right;One rail Left;With crutches;Alternating pattern;Step to pattern   step-to ascend & alternating descend   Ramp 5: Supervision   min guard, right forearm crutch & Transfemoral prosthesis   Ramp Details (indicate cue type and reason) PT demo & verbal cues on technique    Curb 5: Supervision   right forearm crutch & Transfemoral prosthesis   Curb Details (indicate cue type and reason) PT demo & verbal cues on technique    Pre-Gait Activities PT demo & verbal cues prior and mirror, tactile & verbal cues during on proper step width, wt shift over prosthesis in stance and step length. Focus on pelvic motion.      High  Level Balance   High Level Balance Activities Side stepping;Backward walking   inside //bars (educated on left bar similar to counter or table at home) and right forearm crutch 2 laps & right forearm crutch simulating cane 2 laps   High Level Balance Comments demo, tactile & verbal cues on technique to engage prosthesis & wt shift over prosthesis in stance.      Neuro Re-ed    Neuro Re-ed Details  Tandem balance (wide to narrow) eyes open & feet together eyes closed (head moving, eyes closed and head moving eyes closed) 10 minutes total      Exercises   Exercises Other Exercises      Knee/Hip Exercises: Standing   Forward Step Up Right;1 set;10 reps;Hand Hold: 2;Step Height: 6"   light BUE support   Forward Step Up Limitations PT demo & verbal cues on foot position, using Lt hip muscles for control and connection to neg curbs / stairs.    Step Down Left;1 set;10 reps;Hand Hold: 2;Step Height: 6"   "riding" hydraulics of prosthesis   Step Down Limitations PT demo & verbal cues on foot position, using Lt hip muscles for control and connection to neg stairs & knee control for lowering weight.      Prosthetics   Prosthetic Care Comments  Continue wear to 6hrs  2x/day initiating morning wear upon arising & trying to time second wear removing at bedtime.    Current prosthetic wear tolerance (days/week)  daily    Current prosthetic wear tolerance (#hours/day)  6hrs 2x/day    Current prosthetic weight-bearing tolerance (hours/day)  see pain assessment for residual limb pain.    Residual limb condition  reports no issues    Education Provided Proper wear schedule/adjustment;Other (comment)   see prosthetic care comments   Person(s) Educated Patient    Education Method Explanation;Verbal cues    Education Method Verbalized understanding;Verbal cues required;Needs further instruction    Donning Prosthesis Modified independent (device/increased time)                       PT Short Term Goals - 12/02/21 1249       PT SHORT TERM GOAL #1   Title Patient reports wearing prosthesis >12 hours 7 days /wk. with residual limb pain </=4/10    Time 1    Period Months    Status On-going    Target Date 12/26/21      PT SHORT TERM GOAL #2   Title Patient ambulates 150' with single forearm crutch & prosthesis with supervision.    Time 1    Period Months    Status On-going    Target Date 12/26/21      PT SHORT TERM GOAL #3   Title Patient able to pick up 5# from floor without UE support safely.    Time 1    Period Months    Status On-going    Target Date 12/26/21      PT SHORT TERM GOAL #4   Title Patient descends stairs with 1 rail & 1 crutch using step over step pattern with supervision.    Time 1    Period Months    Status On-going    Target Date 12/26/21               PT Long Term Goals - 11/28/21 2226       PT LONG TERM GOAL #1   Title Patient demonstrates & verbalized understanding of prosthetic care  to enable safe utilization of prosthesis and tolerates wear >90% of awake hours without skin issues or limb pain.    Time 16    Period Weeks    Status On-going    Target Date 03/21/21      PT LONG TERM GOAL #2   Title  Timed Up & Go standard with prosthesis only < 20sec and cognitive TUG with prosthesis only < 30 sec.    Time 16    Period Weeks    Status On-going    Target Date 03/21/21      PT LONG TERM GOAL #3   Title Berg Balance >/= 45/56 to indicate lower fall risk    Time 16    Period Weeks    Status On-going    Target Date 03/21/21      PT LONG TERM GOAL #4   Title Patient ambulates 500' with cane or single forearm crutch & prosthesis modified independent.    Time 16    Period Weeks    Status On-going    Target Date 03/21/21      PT LONG TERM GOAL #5   Title Patient negotiates ramps, curbs & stairs single rail with cane or single forearm crutch & prosthesis modified independent.    Time 20    Period Weeks    Status On-going    Target Date 03/21/21      PT LONG TERM GOAL #6   Title Gait velocity with single forearm crutch or cane and prosthesis >/= 1.90 ft/sec comfortable pace & >/= 2.40 ft/sec fast pace.    Time 16    Period Weeks    Status On-going    Target Date 03/21/21                   Plan - 12/19/21 1423     Clinical Impression Statement Patient appears on target to meet STGs next week. He is tolerating controlled increase in wear time of prosthesis without increased limb pain.  He is also tolerating progressively increasing weight bearing on prosthesis.  Pt continues to benefit from skilled PT.    Personal Factors and Comorbidities Comorbidity 3+;Fitness;Time since onset of injury/illness/exacerbation    Comorbidities PAD, gangrene s/p L AKA 11/06/20, HTN, CAD s/p STEMI with stenting 08/17/2020, and CHF. cryptogenic stroke Dec 2021    Examination-Activity Limitations Lift;Locomotion Level;Squat;Stairs;Stand;Transfers;Carry    Examination-Participation Restrictions Community Activity;Occupation    Stability/Clinical Decision Making Evolving/Moderate complexity    Rehab Potential Good    PT Frequency 2x / week    PT Duration Other (comment)   16 weeks   PT  Treatment/Interventions ADLs/Self Care Home Management;DME Instruction;Gait training;Stair training;Functional mobility training;Therapeutic activities;Therapeutic exercise;Balance training;Neuromuscular re-education;Patient/family education;Prosthetic Training;Manual techniques;Passive range of motion    PT Next Visit Plan check STGs next week, work on stairs alternating pattern desceding, work on standing balance & gait    Consulted and Agree with Plan of Care Patient             Patient will benefit from skilled therapeutic intervention in order to improve the following deficits and impairments:  Decreased activity tolerance, Decreased balance, Decreased endurance, Decreased knowledge of use of DME, Decreased mobility, Decreased range of motion, Decreased strength, Increased edema, Postural dysfunction, Prosthetic Dependency, Pain, Abnormal gait  Visit Diagnosis: Other abnormalities of gait and mobility  Unsteadiness on feet  Muscle weakness (generalized)  Abnormal posture  Stiffness of left hip, not elsewhere classified  Pain in left leg  Phantom limb syndrome with  pain [facility])     Problem List Patient Active Problem List   Diagnosis Date Noted   Encounter for loop recorder check 07/26/2021   Cryptogenic stroke ([place]) 05/10/2021   Loop Biotronic [id number] 05/10/2021 05/10/2021   AVM (arteriovenous malformation) 03/20/2021   Pulmonary AV (arteriovenous) fistula ([place]) 02/20/2021   Pre-ulcerative calluses 12/20/2020   Cerebral thrombosis with cerebral infarction 12/11/2020   Cerebrovascular accident (CVA) due to embolism of cerebral artery ([place]) 12/11/2020   Pressure injury of skin 11/12/2020   Acute urinary retention 11/09/2020   Normal anion gap metabolic acidosis 49/70/2637   Hypotension 11/07/2020   Chest pain 10/31/2020   Acute on chronic HFrEF (heart failure with reduced ejection fraction) ([place])    Goals of care, counseling/discussion    Palliative care by  specialist    DNR (do not resuscitate)    Critical limb ischemia with history of revascularization of same extremity ([place]) 10/30/2020   Hyperglycemia 10/30/2020   Tobacco abuse 10/30/2020   Leukocytosis 10/30/2020   Chronic combined systolic and diastolic CHF (congestive heart failure) ([place]) 10/30/2020   Ischemic cardiomyopathy 10/24/2020   AKI (acute kidney injury) ([place]) 10/17/2020   PAD (peripheral artery disease) ([place]) 10/16/2020   Critical lower limb ischemia ([place]) 10/05/2020   Rest pain of left upper extremity due to atherosclerosis ([place]) 10/05/2020   Severe claudication ([place]) 09/20/2020   Paresthesia of both lower extremities 09/20/2020   H/O cardiac arrest 09/20/2020   H/O acute myocardial infarction 08/14/2020   Snoring 08/14/2020   Sinus pause 08/14/2020   Mixed hyperlipidemia 08/14/2020   Tobacco dependence 08/14/2020   Coronary artery disease involving native coronary artery of native heart without angina pectoris 08/10/2020   HFrEF (heart failure with reduced ejection fraction) ([place]) 08/10/2020   Nonrheumatic mitral valve regurgitation 08/10/2020   STEMI (ST elevation myocardial infarction) ([place]) 08/02/2020   Accelerated hypertension 08/01/2020    Jamey Reas, PT, DPT 12/19/2021, 2:31 PM  [facility] [address] [place], [place], [address] Phone: [phone]   Fax:  [phone]  Name: Mike [name] MRN: [id number] Date of Birth: 08-09-1961

## 2021-12-24 ENCOUNTER — Other Ambulatory Visit: Payer: Self-pay

## 2021-12-24 ENCOUNTER — Encounter: Payer: BC Managed Care – PPO | Admitting: Physical Therapy

## 2021-12-24 DIAGNOSIS — I2510 Atherosclerotic heart disease of native coronary artery without angina pectoris: Secondary | ICD-10-CM

## 2021-12-24 DIAGNOSIS — I739 Peripheral vascular disease, unspecified: Secondary | ICD-10-CM

## 2021-12-24 DIAGNOSIS — I5020 Unspecified systolic (congestive) heart failure: Secondary | ICD-10-CM

## 2021-12-24 MED ORDER — ASPIRIN EC 81 MG PO TBEC: 81.0000 mg | DELAYED_RELEASE_TABLET | Freq: Every day | ORAL | 3 refills | Status: DC

## 2021-12-24 MED ORDER — METOPROLOL SUCCINATE ER 25 MG PO TB24: 25.0000 mg | ORAL_TABLET | Freq: Every day | ORAL | 3 refills | Status: DC

## 2021-12-26 ENCOUNTER — Other Ambulatory Visit: Payer: Self-pay

## 2021-12-26 ENCOUNTER — Encounter: Payer: Self-pay | Admitting: Physical Therapy

## 2021-12-26 ENCOUNTER — Ambulatory Visit (INDEPENDENT_AMBULATORY_CARE_PROVIDER_SITE_OTHER): Payer: BC Managed Care – PPO | Admitting: Physical Therapy

## 2021-12-26 DIAGNOSIS — R293 Abnormal posture: Secondary | ICD-10-CM

## 2021-12-26 DIAGNOSIS — M6281 Muscle weakness (generalized): Secondary | ICD-10-CM | POA: Diagnosis not present

## 2021-12-26 DIAGNOSIS — R2689 Other abnormalities of gait and mobility: Secondary | ICD-10-CM | POA: Diagnosis not present

## 2021-12-26 DIAGNOSIS — M79605 Pain in left leg: Secondary | ICD-10-CM

## 2021-12-26 DIAGNOSIS — R2681 Unsteadiness on feet: Secondary | ICD-10-CM

## 2021-12-26 DIAGNOSIS — M25652 Stiffness of left hip, not elsewhere classified: Secondary | ICD-10-CM

## 2021-12-26 NOTE — Therapy (Signed)
[facility] [address] [place], [place], [address] Phone: [phone]   Fax:  [phone]  Physical Therapy Treatment  Patient Details  Name: Mike [name] MRN: [id number] Date of Birth: May 08, 1961 Referring Provider (PT): Meridee Score, MD   Encounter Date: 12/26/2021   PT End of Session - 12/26/21 1233     Visit Number 26    Number of Visits 21    Date for PT Re-Evaluation 02/20/22    Authorization Type BCBS    Authorization Time Period OOP & Deductible MET for 2022    PT Start Time 1149    PT Stop Time 1229    PT Time Calculation (min) 40 min    Equipment Utilized During Treatment Gait belt    Activity Tolerance Patient tolerated treatment well;Patient limited by pain    Behavior During Therapy [facility] for tasks assessed/performed             Past Medical History:  Diagnosis Date   Anxiety    CAD (coronary artery disease)    Cryptogenic stroke ([place]) 05/10/2021   Encounter for loop recorder check 07/26/2021   Heart attack ([place]) 08/01/2020   Heart failure with reduced ejection fraction [facility])    Hyperlipidemia    Hypertension    [employer] [id number] 05/10/2021 05/10/2021   PAD (peripheral artery disease) ([place])    right CIA & right distal iliac artery stents 10/16/20; left AKA 11/06/20   PFO (patent foramen ovale) 12/14/2020   Pneumonia    Pulmonary AV (arteriovenous) fistula ([place]) 12/14/2020   Stroke ([place]) 12/11/2020   no residuial effects    Past Surgical History:  Procedure Laterality Date   ABDOMINAL AORTOGRAM W/LOWER EXTREMITY N/A 10/16/2020   Procedure: ABDOMINAL AORTOGRAM W/LOWER EXTREMITY;  Surgeon: Nigel Mormon, MD;  Location: [place] CV [facility];  Service: Cardiovascular;  Laterality: N/A;   AMPUTATION Left 11/06/2020   Procedure: AMPUTATION ABOVE KNEE;  Surgeon: Rosetta Posner, MD;  Location: [place];  Service: Vascular;  Laterality: Left;   BUBBLE STUDY  12/14/2020   Procedure: BUBBLE STUDY;  Surgeon:  Nigel Mormon, MD;  Location: [place];  Service: Cardiovascular;;   CARDIAC CATHETERIZATION     CORONARY STENT INTERVENTION N/A 08/03/2020   Procedure: CORONARY STENT INTERVENTION;  Surgeon: Nigel Mormon, MD;  Location: [place] [facility];  Service: Cardiovascular;  Laterality: N/A;   CORONARY/GRAFT ACUTE MI REVASCULARIZATION N/A 08/01/2020   Procedure: Coronary/Graft Acute MI Revascularization;  Surgeon: Nigel Mormon, MD;  Location: [place] [facility];  Service: Cardiovascular;  Laterality: N/A;   INTRAVASCULAR LITHOTRIPSY Right 10/16/2020   Procedure: INTRAVASCULAR LITHOTRIPSY;  Surgeon: Nigel Mormon, MD;  Location: [place] [facility];  Service: Cardiovascular;  Laterality: Right;  Common and External Iliac   IR ANGIOGRAM PULMONARY LEFT SELECTIVE  03/20/2021   IR ANGIOGRAM SELECTIVE EACH ADDITIONAL VESSEL  03/20/2021   IR EMBO ARTERIAL NOT HEMORR HEMANG INC GUIDE ROADMAPPING  03/20/2021   IR RADIOLOGIST EVAL & MGMT  01/30/2021   IR RADIOLOGIST EVAL & MGMT  04/18/2021   IR RADIOLOGIST EVAL & MGMT  07/25/2021   IR US GUIDE VASC ACCESS RIGHT  03/20/2021   LEFT HEART CATH AND CORONARY ANGIOGRAPHY N/A 08/01/2020   Procedure: LEFT HEART CATH AND CORONARY ANGIOGRAPHY;  Surgeon: Nigel Mormon, MD;  Location: [place] [facility];  Service: Cardiovascular;  Laterality: N/A;   LEFT HEART CATH AND CORONARY ANGIOGRAPHY N/A 08/03/2020   Procedure: LEFT HEART CATH AND CORONARY ANGIOGRAPHY;  Surgeon: Nigel Mormon, MD;  Location: [place] [facility];  Service: Cardiovascular;  Laterality: N/A;   PERIPHERAL VASCULAR INTERVENTION Right 10/16/2020   Procedure: PERIPHERAL VASCULAR INTERVENTION;  Surgeon: Nigel Mormon, MD;  Location: [place] [facility];  Service: Cardiovascular;  Laterality: Right;  Common and Iliac Stent   RADIOLOGY WITH ANESTHESIA N/A 03/20/2021   Procedure: PULMONARY EMBOLIZATION;  Surgeon: Aletta Edouard, MD;  Location: [place];  Service: Radiology;   Laterality: N/A;   TEE WITHOUT CARDIOVERSION N/A 12/14/2020   Procedure: TRANSESOPHAGEAL ECHOCARDIOGRAM (TEE);  Surgeon: Nigel Mormon, MD;  Location: [facility] END[facility];  Service: Cardiovascular;  Laterality: N/A;    There were no vitals filed for this visit.   Subjective Assessment - 12/26/21 1211     Subjective He wore prosthesis 10 hours straight and feels that it was too much causing lots of pain in his residual limb. He has cut back on the wear time and broke it up more and this has helped with pain.    Pertinent History PAD, gangrene s/p L AKA 11/06/20, HTN, CAD s/p STEMI with stenting 08/17/2020, and CHF. cryptogenic stroke Dec 2021    Patient Stated Goals to return to work, (he was released from current job over last weekend but wants to find job).  to walk in community,    Pain Onset More than a month ago    Pain Onset More than a month ago                               [facility] Adult PT Treatment/Exercise - 12/26/21 0001       Transfers   Transfers Sit to Stand;Stand to Sit    Sit to Stand 5: Supervision;With upper extremity assist;From chair/3-in-1    Stand to Sit 5: Supervision;With upper extremity assist;To chair/3-in-1      Ambulation/Gait   Ambulation/Gait Yes    Ambulation/Gait Assistance 5: Supervision   right crutch supervision   Ambulation Distance (Feet) 100 Feet   100' X 2 right crutch, 100' 2 forearm crutches   Assistive device R Forearm Crutch;Prosthesis;Lofstrands    Stairs Assistance 5: Supervision    Stair Management Technique One rail Right;With crutches;Alternating pattern;Step to pattern   reciprocal step down pattern for ascending and step to patten ascending. Used Rt rail and Lt Forearm crutch   Ramp 5: Supervision   min guard, right forearm crutch & Transfemoral prosthesis   Curb 5: Supervision   right forearm crutch & Transfemoral prosthesis     High Level Balance   High Level Balance Activities Side stepping;Backward walking   3  round trips in bars with one UE support   High Level Balance Comments demo, tactile & verbal cues on technique to engage prosthesis & wt shift over prosthesis in stance.      Neuro Re-ed    Neuro Re-ed Details  Tandem balance (wide to narrow) eyes open & feet together eyes closed (head moving, eyes closed and head moving eyes closed) 10 minutes total      Knee/Hip Exercises: Standing   Forward Step Up Right;1 set;10 reps;Hand Hold: 2;Step Height: 6"   light BUE support   Forward Step Up Limitations PT demo & verbal cues on foot position, using Lt hip muscles for control and connection to neg curbs / stairs.    Step Down Left;1 set;10 reps;Hand Hold: 2;Step Height: 6"   "riding" hydraulics of prosthesis   Step Down  Limitations PT demo & verbal cues on foot position, using Lt hip muscles for control and connection to neg stairs & knee control for lowering weight.    Other Standing Knee Exercises picking up 5# kettlebell X3 reps with supervision                       PT Short Term Goals - 12/26/21 1217       PT SHORT TERM GOAL #1   Title Patient reports wearing prosthesis >12 hours 7 days /wk. with residual limb pain </=4/10    Baseline limited some by pain tolerance    Time 1    Period Months    Status On-going    Target Date 12/26/21      PT SHORT TERM GOAL #2   Title Patient ambulates 150' with single forearm crutch & prosthesis with supervision.    Baseline met 12/29    Time 1    Period Months    Status Achieved    Target Date 12/26/21      PT SHORT TERM GOAL #3   Title Patient able to pick up 5# from floor without UE support safely.    Baseline met 12/29    Time 1    Period Months    Status Achieved    Target Date 12/26/21      PT SHORT TERM GOAL #4   Title Patient descends stairs with 1 rail & 1 crutch using step over step pattern with supervision.    Baseline met 12/29    Time 1    Period Months    Status Achieved    Target Date 12/26/21      PT  SHORT TERM GOAL #5   Title Patient negotiates ramps & curbs with forearm crutches & prosthesis with supervision.    Baseline met 12/29               PT Long Term Goals - 11/28/21 2226       PT LONG TERM GOAL #1   Title Patient demonstrates & verbalized understanding of prosthetic care to enable safe utilization of prosthesis and tolerates wear >90% of awake hours without skin issues or limb pain.    Time 16    Period Weeks    Status On-going    Target Date 03/21/21      PT LONG TERM GOAL #2   Title Timed Up & Go standard with prosthesis only < 20sec and cognitive TUG with prosthesis only < 30 sec.    Time 16    Period Weeks    Status On-going    Target Date 03/21/21      PT LONG TERM GOAL #3   Title Berg Balance >/= 45/56 to indicate lower fall risk    Time 16    Period Weeks    Status On-going    Target Date 03/21/21      PT LONG TERM GOAL #4   Title Patient ambulates 500' with cane or single forearm crutch & prosthesis modified independent.    Time 16    Period Weeks    Status On-going    Target Date 03/21/21      PT LONG TERM GOAL #5   Title Patient negotiates ramps, curbs & stairs single rail with cane or single forearm crutch & prosthesis modified independent.    Time 20    Period Weeks    Status On-going    Target Date 03/21/21  PT LONG TERM GOAL #6   Title Gait velocity with single forearm crutch or cane and prosthesis >/= 1.90 ft/sec comfortable pace & >/= 2.40 ft/sec fast pace.    Time 16    Period Weeks    Status On-going    Target Date 03/21/21                   Plan - 12/26/21 1234     Clinical Impression Statement He is progresing very well with PT and has now met all STG but wear time goal ongoing due to pain and we will continue to need gradual progression with breaks. Continue POC.    Personal Factors and Comorbidities Comorbidity 3+;Fitness;Time since onset of injury/illness/exacerbation    Comorbidities PAD, gangrene s/p L  AKA 11/06/20, HTN, CAD s/p STEMI with stenting 08/17/2020, and CHF. cryptogenic stroke Dec 2021    Examination-Activity Limitations Lift;Locomotion Level;Squat;Stairs;Stand;Transfers;Carry    Examination-Participation Restrictions Community Activity;Occupation    Stability/Clinical Decision Making Evolving/Moderate complexity    Rehab Potential Good    PT Frequency 2x / week    PT Duration Other (comment)   16 weeks   PT Treatment/Interventions ADLs/Self Care Home Management;DME Instruction;Gait training;Stair training;Functional mobility training;Therapeutic activities;Therapeutic exercise;Balance training;Neuromuscular re-education;Patient/family education;Prosthetic Training;Manual techniques;Passive range of motion    PT Next Visit Plan establish new STG now that he has met his, work on stairs alternating pattern desceding, work on standing balance & gait    Consulted and Agree with Plan of Care Patient             Patient will benefit from skilled therapeutic intervention in order to improve the following deficits and impairments:  Decreased activity tolerance, Decreased balance, Decreased endurance, Decreased knowledge of use of DME, Decreased mobility, Decreased range of motion, Decreased strength, Increased edema, Postural dysfunction, Prosthetic Dependency, Pain, Abnormal gait  Visit Diagnosis: Other abnormalities of gait and mobility  Unsteadiness on feet  Muscle weakness (generalized)  Abnormal posture  Stiffness of left hip, not elsewhere classified  Pain in left leg     Problem List Patient Active Problem List   Diagnosis Date Noted   Encounter for loop recorder check 07/26/2021   Cryptogenic stroke ([place]) 05/10/2021   [employer] [id number] 05/10/2021 05/10/2021   AVM (arteriovenous malformation) 03/20/2021   Pulmonary AV (arteriovenous) fistula ([place]) 02/20/2021   Pre-ulcerative calluses 12/20/2020   Cerebral thrombosis with cerebral infarction 12/11/2020    Cerebrovascular accident (CVA) due to embolism of cerebral artery ([place]) 12/11/2020   Pressure injury of skin 11/12/2020   Acute urinary retention 11/09/2020   Normal anion gap metabolic acidosis 19/37/9024   Hypotension 11/07/2020   Chest pain 10/31/2020   Acute on chronic HFrEF (heart failure with reduced ejection fraction) ([place])    Goals of care, counseling/discussion    Palliative care by specialist    DNR (do not resuscitate)    Critical limb ischemia with history of revascularization of same extremity ([place]) 10/30/2020   Hyperglycemia 10/30/2020   Tobacco abuse 10/30/2020   Leukocytosis 10/30/2020   Chronic combined systolic and diastolic CHF (congestive heart failure) ([place]) 10/30/2020   Ischemic cardiomyopathy 10/24/2020   AKI (acute kidney injury) ([place]) 10/17/2020   PAD (peripheral artery disease) ([place]) 10/16/2020   Critical lower limb ischemia ([place]) 10/05/2020   Rest pain of left upper extremity due to atherosclerosis ([place]) 10/05/2020   Severe claudication ([place]) 09/20/2020   Paresthesia of both lower extremities 09/20/2020   H/O cardiac arrest 09/20/2020   H/O acute myocardial infarction  08/14/2020   Snoring 08/14/2020   Sinus pause 08/14/2020   Mixed hyperlipidemia 08/14/2020   Tobacco dependence 08/14/2020   Coronary artery disease involving native coronary artery of native heart without angina pectoris 08/10/2020   HFrEF (heart failure with reduced ejection fraction) ([place]) 08/10/2020   Nonrheumatic mitral valve regurgitation 08/10/2020   STEMI (ST elevation myocardial infarction) ([place]) 08/02/2020   Accelerated hypertension 08/01/2020    Debbe Odea, PT,DPT 12/26/2021, 12:36 PM  [facility] [address] [place], [place], [address] Phone: [phone]   Fax:  [phone]  Name: Mike [name] MRN: [id number] Date of Birth: [birth year]

## 2021-12-31 ENCOUNTER — Ambulatory Visit (INDEPENDENT_AMBULATORY_CARE_PROVIDER_SITE_OTHER): Payer: BC Managed Care – PPO | Admitting: Physical Therapy

## 2021-12-31 ENCOUNTER — Other Ambulatory Visit: Payer: Self-pay

## 2021-12-31 DIAGNOSIS — M6281 Muscle weakness (generalized): Secondary | ICD-10-CM

## 2021-12-31 DIAGNOSIS — R2689 Other abnormalities of gait and mobility: Secondary | ICD-10-CM

## 2021-12-31 DIAGNOSIS — R293 Abnormal posture: Secondary | ICD-10-CM | POA: Diagnosis not present

## 2021-12-31 DIAGNOSIS — M79605 Pain in left leg: Secondary | ICD-10-CM

## 2021-12-31 DIAGNOSIS — R2681 Unsteadiness on feet: Secondary | ICD-10-CM

## 2021-12-31 DIAGNOSIS — M25652 Stiffness of left hip, not elsewhere classified: Secondary | ICD-10-CM

## 2021-12-31 NOTE — Therapy (Signed)
[facility] Physical Therapy [address] [place], [place], [address] Phone: [phone]   Fax:  [phone]  Physical Therapy Treatment  Patient Details  Name: Mike [name] MRN: [id number] Date of Birth: Mar 03, 1961 Referring Provider (PT): Meridee Score, MD   Encounter Date: 12/31/2021   PT End of Session - 12/31/21 1419     Visit Number 27    Number of Visits 74    Date for PT Re-Evaluation 02/20/22    Authorization Type BCBS    Authorization Time Period OOP & Deductible MET for 2022    PT Start Time 1148    PT Stop Time 1230    PT Time Calculation (min) 42 min    Equipment Utilized During Treatment Gait belt    Activity Tolerance Patient tolerated treatment well;Patient limited by pain    Behavior During Therapy [facility] for tasks assessed/performed             Past Medical History:  Diagnosis Date   Anxiety    CAD (coronary artery disease)    Cryptogenic stroke ([place]) 05/10/2021   Encounter for loop recorder check 07/26/2021   Heart attack ([place]) 08/01/2020   Heart failure with reduced ejection fraction [facility])    Hyperlipidemia    Hypertension    Loop [employer] [id number] 05/10/2021 05/10/2021   PAD (peripheral artery disease) ([place])    right CIA & right distal iliac artery stents 10/16/20; left AKA 11/06/20   PFO (patent foramen ovale) 12/14/2020   Pneumonia    Pulmonary AV (arteriovenous) fistula ([place]) 12/14/2020   Stroke ([place]) 12/11/2020   no residuial effects    Past Surgical History:  Procedure Laterality Date   ABDOMINAL AORTOGRAM W/LOWER EXTREMITY N/A 10/16/2020   Procedure: ABDOMINAL AORTOGRAM W/LOWER EXTREMITY;  Surgeon: Nigel Mormon, MD;  Location: [place] [facility];  Service: Cardiovascular;  Laterality: N/A;   AMPUTATION Left 11/06/2020   Procedure: AMPUTATION ABOVE KNEE;  Surgeon: Rosetta Posner, MD;  Location: [place];  Service: Vascular;  Laterality: Left;   BUBBLE STUDY  12/14/2020   Procedure: BUBBLE STUDY;  Surgeon:  Nigel Mormon, MD;  Location: [place];  Service: Cardiovascular;;   CARDIAC CATHETERIZATION     CORONARY STENT INTERVENTION N/A 08/03/2020   Procedure: CORONARY STENT INTERVENTION;  Surgeon: Nigel Mormon, MD;  Location: [place] [facility];  Service: Cardiovascular;  Laterality: N/A;   CORONARY/GRAFT ACUTE MI REVASCULARIZATION N/A 08/01/2020   Procedure: Coronary/Graft Acute MI Revascularization;  Surgeon: Nigel Mormon, MD;  Location: [place] [facility];  Service: Cardiovascular;  Laterality: N/A;   INTRAVASCULAR LITHOTRIPSY Right 10/16/2020   Procedure: INTRAVASCULAR LITHOTRIPSY;  Surgeon: Nigel Mormon, MD;  Location: [place] [facility];  Service: Cardiovascular;  Laterality: Right;  Common and External Iliac   IR ANGIOGRAM PULMONARY LEFT SELECTIVE  03/20/2021   IR ANGIOGRAM SELECTIVE EACH ADDITIONAL VESSEL  03/20/2021   IR EMBO ARTERIAL NOT HEMORR HEMANG INC GUIDE ROADMAPPING  03/20/2021   IR RADIOLOGIST EVAL & MGMT  01/30/2021   IR RADIOLOGIST EVAL & MGMT  04/18/2021   IR RADIOLOGIST EVAL & MGMT  07/25/2021   IR US GUIDE VASC ACCESS RIGHT  03/20/2021   LEFT HEART CATH AND CORONARY ANGIOGRAPHY N/A 08/01/2020   Procedure: LEFT HEART CATH AND CORONARY ANGIOGRAPHY;  Surgeon: Nigel Mormon, MD;  Location: [place] [facility];  Service: Cardiovascular;  Laterality: N/A;   LEFT HEART CATH AND CORONARY ANGIOGRAPHY N/A 08/03/2020   Procedure: LEFT HEART CATH AND CORONARY ANGIOGRAPHY;  Surgeon: Nigel Mormon, MD;  Location: [place] [facility];  Service: Cardiovascular;  Laterality: N/A;   PERIPHERAL VASCULAR INTERVENTION Right 10/16/2020   Procedure: PERIPHERAL VASCULAR INTERVENTION;  Surgeon: Nigel Mormon, MD;  Location: [place] [facility];  Service: Cardiovascular;  Laterality: Right;  Common and Iliac Stent   RADIOLOGY WITH ANESTHESIA N/A 03/20/2021   Procedure: PULMONARY EMBOLIZATION;  Surgeon: Aletta Edouard, MD;  Location: [place];  Service: Radiology;   Laterality: N/A;   TEE WITHOUT CARDIOVERSION N/A 12/14/2020   Procedure: TRANSESOPHAGEAL ECHOCARDIOGRAM (TEE);  Surgeon: Nigel Mormon, MD;  Location: [facility] ENDOS[facility];  Service: Cardiovascular;  Laterality: N/A;    There were no vitals filed for this visit.   Subjective Assessment - 12/31/21 1410     Subjective Pain is about 5.5 out of 10 today in his residual limb    Pertinent History PAD, gangrene s/p L AKA 11/06/20, HTN, CAD s/p STEMI with stenting 08/17/2020, and CHF. cryptogenic stroke Dec 2021    Patient Stated Goals to return to work, (he was released from current job over last weekend but wants to find job).  to walk in community,    Pain Onset More than a month ago    Pain Onset More than a month ago                               [facility] Adult PT Treatment/Exercise - 12/31/21 0001       Transfers   Transfers Sit to Stand;Stand to Sit    Sit to Stand 5: Supervision;With upper extremity assist;From chair/3-in-1    Stand to Sit 5: Supervision;With upper extremity assist;To chair/3-in-1      Ambulation/Gait   Ambulation/Gait Yes    Ambulation/Gait Assistance 5: Supervision    Ambulation Distance (Feet) 150 Feet   X1 with bilat FA crutches and X1 Rt crutch only   Assistive device R Forearm Crutch;Prosthesis;Lofstrands    Stairs Assistance 5: Supervision    Stair Management Technique One rail Right;With crutches;Alternating pattern;Step to pattern   reciprocal step down pattern for descending and step to patten ascending. Used Rt rail and Lt Forearm crutch   Ramp 5: Supervision   min guard, right forearm crutch & Transfemoral prosthesis   Curb 5: Supervision   right forearm crutch & Transfemoral prosthesis     High Level Balance   High Level Balance Activities Side stepping;Backward walking    High Level Balance Comments 3 round trips in bars using as little UE support as needed.      Neuro Re-ed    Neuro Re-ed Details  Tandem balance 60 sec X 1 bilat.  Balance on airex pad with head turns X10 feet apart then feet together without headturns 60 sec. Then balance on level surface with eyes closed 30 sec X3      Knee/Hip Exercises: Standing   Forward Step Up Right;1 set;10 reps;Hand Hold: 2;Step Height: 6"   light BUE support   Forward Step Up Limitations PT demo & verbal cues on foot position, using Lt hip muscles for control and connection to neg curbs / stairs.    Step Down Left;1 set;10 reps;Hand Hold: 2;Step Height: 6"   "riding" hydraulics of prosthesis   Step Down Limitations PT demo & verbal cues on foot position, using Lt hip muscles for control and connection to neg stairs & knee control for lowering weight.  PT Short Term Goals - 12/26/21 1217       PT SHORT TERM GOAL #1   Title Patient reports wearing prosthesis >12 hours 7 days /wk. with residual limb pain </=4/10    Baseline limited some by pain tolerance    Time 1    Period Months    Status On-going    Target Date 12/26/21      PT SHORT TERM GOAL #2   Title Patient ambulates 150' with single forearm crutch & prosthesis with supervision.    Baseline met 12/29    Time 1    Period Months    Status Achieved    Target Date 12/26/21      PT SHORT TERM GOAL #3   Title Patient able to pick up 5# from floor without UE support safely.    Baseline met 12/29    Time 1    Period Months    Status Achieved    Target Date 12/26/21      PT SHORT TERM GOAL #4   Title Patient descends stairs with 1 rail & 1 crutch using step over step pattern with supervision.    Baseline met 12/29    Time 1    Period Months    Status Achieved    Target Date 12/26/21      PT SHORT TERM GOAL #5   Title Patient negotiates ramps & curbs with forearm crutches & prosthesis with supervision.    Baseline met 12/29               PT Long Term Goals - 11/28/21 2226       PT LONG TERM GOAL #1   Title Patient demonstrates & verbalized understanding of  prosthetic care to enable safe utilization of prosthesis and tolerates wear >90% of awake hours without skin issues or limb pain.    Time 16    Period Weeks    Status On-going    Target Date 03/21/21      PT LONG TERM GOAL #2   Title Timed Up & Go standard with prosthesis only < 20sec and cognitive TUG with prosthesis only < 30 sec.    Time 16    Period Weeks    Status On-going    Target Date 03/21/21      PT LONG TERM GOAL #3   Title Berg Balance >/= 45/56 to indicate lower fall risk    Time 16    Period Weeks    Status On-going    Target Date 03/21/21      PT LONG TERM GOAL #4   Title Patient ambulates 500' with cane or single forearm crutch & prosthesis modified independent.    Time 16    Period Weeks    Status On-going    Target Date 03/21/21      PT LONG TERM GOAL #5   Title Patient negotiates ramps, curbs & stairs single rail with cane or single forearm crutch & prosthesis modified independent.    Time 20    Period Weeks    Status On-going    Target Date 03/21/21      PT LONG TERM GOAL #6   Title Gait velocity with single forearm crutch or cane and prosthesis >/= 1.90 ft/sec comfortable pace & >/= 2.40 ft/sec fast pace.    Time 16    Period Weeks    Status On-going    Target Date 03/21/21  Plan - 12/31/21 1420     Clinical Impression Statement He is progressing with ambulation using one forearm crutch and also with stair navigation. We will continue to work to improve his functional abilities while monitoring pain in his residual limb.    Personal Factors and Comorbidities Comorbidity 3+;Fitness;Time since onset of injury/illness/exacerbation    Comorbidities PAD, gangrene s/p L AKA 11/06/20, HTN, CAD s/p STEMI with stenting 08/17/2020, and CHF. cryptogenic stroke Dec 2021    Examination-Activity Limitations Lift;Locomotion Level;Squat;Stairs;Stand;Transfers;Carry    Examination-Participation Restrictions Community Activity;Occupation     Stability/Clinical Decision Making Evolving/Moderate complexity    Rehab Potential Good    PT Frequency 2x / week    PT Duration Other (comment)   16 weeks   PT Treatment/Interventions ADLs/Self Care Home Management;DME Instruction;Gait training;Stair training;Functional mobility training;Therapeutic activities;Therapeutic exercise;Balance training;Neuromuscular re-education;Patient/family education;Prosthetic Training;Manual techniques;Passive range of motion    PT Next Visit Plan establish new STG now that he has met his, work on stairs alternating pattern desceding, work on standing balance & gait    Consulted and Agree with Plan of Care Patient             Patient will benefit from skilled therapeutic intervention in order to improve the following deficits and impairments:  Decreased activity tolerance, Decreased balance, Decreased endurance, Decreased knowledge of use of DME, Decreased mobility, Decreased range of motion, Decreased strength, Increased edema, Postural dysfunction, Prosthetic Dependency, Pain, Abnormal gait  Visit Diagnosis: Other abnormalities of gait and mobility  Unsteadiness on feet  Muscle weakness (generalized)  Abnormal posture  Stiffness of left hip, not elsewhere classified  Pain in left leg     Problem List Patient Active Problem List   Diagnosis Date Noted   Encounter for loop recorder check 07/26/2021   Cryptogenic stroke ([place]) 05/10/2021   Loop [employer] [id number] 05/10/2021 05/10/2021   AVM (arteriovenous malformation) 03/20/2021   Pulmonary AV (arteriovenous) fistula ([place]) 02/20/2021   Pre-ulcerative calluses 12/20/2020   Cerebral thrombosis with cerebral infarction 12/11/2020   Cerebrovascular accident (CVA) due to embolism of cerebral artery ([place]) 12/11/2020   Pressure injury of skin 11/12/2020   Acute urinary retention 11/09/2020   Normal anion gap metabolic acidosis 03/88/8280   Hypotension 11/07/2020   Chest pain 10/31/2020    Acute on chronic HFrEF (heart failure with reduced ejection fraction) ([place])    Goals of care, counseling/discussion    Palliative care by specialist    DNR (do not resuscitate)    Critical limb ischemia with history of revascularization of same extremity ([place]) 10/30/2020   Hyperglycemia 10/30/2020   Tobacco abuse 10/30/2020   Leukocytosis 10/30/2020   Chronic combined systolic and diastolic CHF (congestive heart failure) ([place]) 10/30/2020   Ischemic cardiomyopathy 10/24/2020   AKI (acute kidney injury) ([place]) 10/17/2020   PAD (peripheral artery disease) ([place]) 10/16/2020   Critical lower limb ischemia ([place]) 10/05/2020   Rest pain of left upper extremity due to atherosclerosis ([place]) 10/05/2020   Severe claudication ([place]) 09/20/2020   Paresthesia of both lower extremities 09/20/2020   H/O cardiac arrest 09/20/2020   H/O acute myocardial infarction 08/14/2020   Snoring 08/14/2020   Sinus pause 08/14/2020   Mixed hyperlipidemia 08/14/2020   Tobacco dependence 08/14/2020   Coronary artery disease involving native coronary artery of native heart without angina pectoris 08/10/2020   HFrEF (heart failure with reduced ejection fraction) ([place]) 08/10/2020   Nonrheumatic mitral valve regurgitation 08/10/2020   STEMI (ST elevation myocardial infarction) ([place]) 08/02/2020   Accelerated hypertension 08/01/2020  Debbe Odea, PT,DPT 12/31/2021, 2:21 PM  [facility] Physical Therapy [address] [place], [place], [address] Phone: [phone]   Fax:  [phone]  Name: Mike [name] MRN: [id number] Date of Birth: 05/02/1961

## 2022-01-03 ENCOUNTER — Other Ambulatory Visit: Payer: Self-pay | Admitting: Cardiology

## 2022-01-03 DIAGNOSIS — I5020 Unspecified systolic (congestive) heart failure: Secondary | ICD-10-CM

## 2022-01-07 ENCOUNTER — Encounter: Payer: BC Managed Care – PPO | Admitting: Physical Therapy

## 2022-01-12 DIAGNOSIS — Z95818 Presence of other cardiac implants and grafts: Secondary | ICD-10-CM | POA: Diagnosis not present

## 2022-01-12 DIAGNOSIS — Z4509 Encounter for adjustment and management of other cardiac device: Secondary | ICD-10-CM | POA: Diagnosis not present

## 2022-01-12 DIAGNOSIS — I5020 Unspecified systolic (congestive) heart failure: Secondary | ICD-10-CM | POA: Diagnosis not present

## 2022-01-14 ENCOUNTER — Other Ambulatory Visit: Payer: Self-pay

## 2022-01-14 ENCOUNTER — Encounter: Payer: Self-pay | Admitting: Physical Therapy

## 2022-01-14 ENCOUNTER — Ambulatory Visit (INDEPENDENT_AMBULATORY_CARE_PROVIDER_SITE_OTHER): Payer: BC Managed Care – PPO | Admitting: Physical Therapy

## 2022-01-14 DIAGNOSIS — G546 Phantom limb syndrome with pain: Secondary | ICD-10-CM

## 2022-01-14 DIAGNOSIS — R2681 Unsteadiness on feet: Secondary | ICD-10-CM | POA: Diagnosis not present

## 2022-01-14 DIAGNOSIS — I5042 Chronic combined systolic (congestive) and diastolic (congestive) heart failure: Secondary | ICD-10-CM | POA: Diagnosis not present

## 2022-01-14 DIAGNOSIS — M6281 Muscle weakness (generalized): Secondary | ICD-10-CM

## 2022-01-14 DIAGNOSIS — I639 Cerebral infarction, unspecified: Secondary | ICD-10-CM | POA: Diagnosis not present

## 2022-01-14 DIAGNOSIS — R293 Abnormal posture: Secondary | ICD-10-CM

## 2022-01-14 DIAGNOSIS — R2689 Other abnormalities of gait and mobility: Secondary | ICD-10-CM

## 2022-01-14 DIAGNOSIS — R531 Weakness: Secondary | ICD-10-CM | POA: Diagnosis not present

## 2022-01-14 DIAGNOSIS — M25652 Stiffness of left hip, not elsewhere classified: Secondary | ICD-10-CM

## 2022-01-14 DIAGNOSIS — M79605 Pain in left leg: Secondary | ICD-10-CM

## 2022-01-14 NOTE — Therapy (Signed)
[facility] Physical Therapy [address] [place], [place], [address] Phone: [phone]   Fax:  [phone]  Physical Therapy Treatment  Patient Details  Name: Mike [name] MRN: [id number] Date of Birth: 07/10/1961 Referring Provider (PT): Meridee Score, MD   Encounter Date: 01/14/2022   PT End of Session - 01/14/22 1200     Visit Number 28    Number of Visits 49    Date for PT Re-Evaluation 02/20/22    Authorization Type BCBS    Authorization Time Period OOP & Deductible MET for 2022    PT Start Time 1150    PT Stop Time 1230    PT Time Calculation (min) 40 min    Equipment Utilized During Treatment Gait belt    Activity Tolerance Patient tolerated treatment well;Patient limited by pain    Behavior During Therapy [facility] for tasks assessed/performed             Past Medical History:  Diagnosis Date   Anxiety    CAD (coronary artery disease)    Cryptogenic stroke ([place]) 05/10/2021   Encounter for loop recorder check 07/26/2021   Heart attack ([place]) 08/01/2020   Heart failure with reduced ejection fraction ([place])    Hyperlipidemia    Hypertension    Loop [employer] [id number] 05/10/2021 05/10/2021   PAD (peripheral artery disease) ([place])    right CIA & right distal iliac artery stents 10/16/20; left AKA 11/06/20   PFO (patent foramen ovale) 12/14/2020   Pneumonia    Pulmonary AV (arteriovenous) fistula ([place]) 12/14/2020   Stroke ([place]) 12/11/2020   no residuial effects    Past Surgical History:  Procedure Laterality Date   ABDOMINAL AORTOGRAM W/LOWER EXTREMITY N/A 10/16/2020   Procedure: ABDOMINAL AORTOGRAM W/LOWER EXTREMITY;  Surgeon: Nigel Mormon, MD;  Location: [place] [facility];  Service: Cardiovascular;  Laterality: N/A;   AMPUTATION Left 11/06/2020   Procedure: AMPUTATION ABOVE KNEE;  Surgeon: Rosetta Posner, MD;  Location: [place];  Service: Vascular;  Laterality: Left;   BUBBLE STUDY  12/14/2020   Procedure: BUBBLE STUDY;  Surgeon:  Nigel Mormon, MD;  Location: [place];  Service: Cardiovascular;;   CARDIAC CATHETERIZATION     CORONARY STENT INTERVENTION N/A 08/03/2020   Procedure: CORONARY STENT INTERVENTION;  Surgeon: Nigel Mormon, MD;  Location: [place] [facility];  Service: Cardiovascular;  Laterality: N/A;   CORONARY/GRAFT ACUTE MI REVASCULARIZATION N/A 08/01/2020   Procedure: Coronary/Graft Acute MI Revascularization;  Surgeon: Nigel Mormon, MD;  Location: [place] [facility];  Service: Cardiovascular;  Laterality: N/A;   INTRAVASCULAR LITHOTRIPSY Right 10/16/2020   Procedure: INTRAVASCULAR LITHOTRIPSY;  Surgeon: Nigel Mormon, MD;  Location: [place] [facility];  Service: Cardiovascular;  Laterality: Right;  Common and External Iliac   IR ANGIOGRAM PULMONARY LEFT SELECTIVE  03/20/2021   IR ANGIOGRAM SELECTIVE EACH ADDITIONAL VESSEL  03/20/2021   IR EMBO ARTERIAL NOT HEMORR HEMANG INC GUIDE ROADMAPPING  03/20/2021   IR RADIOLOGIST EVAL & MGMT  01/30/2021   IR RADIOLOGIST EVAL & MGMT  04/18/2021   IR RADIOLOGIST EVAL & MGMT  07/25/2021   IR US GUIDE VASC ACCESS RIGHT  03/20/2021   LEFT HEART CATH AND CORONARY ANGIOGRAPHY N/A 08/01/2020   Procedure: LEFT HEART CATH AND CORONARY ANGIOGRAPHY;  Surgeon: Nigel Mormon, MD;  Location: [place] [facility];  Service: Cardiovascular;  Laterality: N/A;   LEFT HEART CATH AND CORONARY ANGIOGRAPHY N/A 08/03/2020   Procedure: LEFT HEART CATH AND CORONARY ANGIOGRAPHY;  Surgeon: Nigel Mormon, MD;  Location: [place] [facility];  Service: Cardiovascular;  Laterality: N/A;   PERIPHERAL VASCULAR INTERVENTION Right 10/16/2020   Procedure: PERIPHERAL VASCULAR INTERVENTION;  Surgeon: Nigel Mormon, MD;  Location: [place] [facility];  Service: Cardiovascular;  Laterality: Right;  Common and Iliac Stent   RADIOLOGY WITH ANESTHESIA N/A 03/20/2021   Procedure: PULMONARY EMBOLIZATION;  Surgeon: Aletta Edouard, MD;  Location: [place];  Service: Radiology;   Laterality: N/A;   TEE WITHOUT CARDIOVERSION N/A 12/14/2020   Procedure: TRANSESOPHAGEAL ECHOCARDIOGRAM (TEE);  Surgeon: Nigel Mormon, MD;  Location: [facility];  Service: Cardiovascular;  Laterality: N/A;    There were no vitals filed for this visit.   Subjective Assessment - 01/14/22 1150     Subjective He had limb pain increase due to increasing activity level. Monday, 1/9 he wore prosthesis 3hrs then 7hrs going out to eat.  No wear Tuesday then a little Wednesday afternoon.  progressed wear to 4hrs & 6.5hrs yesterday.    Pertinent History PAD, gangrene s/p L AKA 11/06/20, HTN, CAD s/p STEMI with stenting 08/17/2020, and CHF. cryptogenic stroke Dec 2021    Patient Stated Goals to return to work, (he was released from current job over last weekend but wants to find job).  to walk in community,    Currently in Pain? Yes    Pain Score 7     Pain Location Leg   residual limb   Pain Orientation Left    Pain Descriptors / Indicators Throbbing;Other (Comment)   boa constrictor   Pain Type Chronic pain    Pain Onset More than a month ago    Pain Frequency Intermittent    Aggravating Factors  increasing wear & weight bearing too quickly    Pain Relieving Factors Rx meds & tylenol    Effect of Pain on Daily Activities slow controlled increase in wear    Pain Onset More than a month ago                               [facility] Adult PT Treatment/Exercise - 01/14/22 1150       Transfers   Transfers Sit to Stand;Stand to Sit    Sit to Stand 5: Supervision;With upper extremity assist;From chair/3-in-1;Without upper extremity assist    Sit to Stand Details Visual cues/gestures for sequencing;Verbal cues for technique    Sit to Stand Details (indicate cue type and reason) worked on sit to stand without UE assist. He could arise from 24" & 22" surface without UE assist with cues for engaging prosthesis for initial stabilization.  Progressed to 20" surface which he was able  to perform 2 reps with multiple attempts.    Stand to Sit 5: Supervision;With upper extremity assist;To chair/3-in-1;Without upper extremity assist    Stand to Sit Details (indicate cue type and reason) Visual cues/gestures for sequencing;Verbal cues for technique    Stand to Sit Details worked on controlling descent without using UEs except light touch to ensure contact but not lower weight.  cues for engaging prosthesis. Progressed from 24" to 22" to 20"      Ambulation/Gait   Ambulation/Gait Yes    Ambulation/Gait Assistance 5: Supervision    Ambulation Distance (Feet) 100 Feet   arrived & exited Bil forearm crutches,  150' X 2 right crutch in clinic   Assistive device R Forearm Crutch;Prosthesis;[place] --  Stair Management Technique --    Ramp --    Curb --      High Level Balance   High Level Balance Activities --    High Level Balance Comments --      Neuro Re-ed    Neuro Re-ed Details  standing with LUE on wall / vertical support with hand just above shoulder height to limit weight bearing. (RUE had forearm crutch cuff on forearm but no weight bearing - safety only if needed to prevent fall).  LLE prosthesis stance stepping RLE forward to bil stance, backward to bil. stance & laterally to bil. stance for 5 reps ea.      Knee/Hip Exercises: Standing   Forward Step Up --    Forward Step Up Limitations --    Step Down --    Step Down Limitations --      Prosthetics   Prosthetic Care Comments  reviewed adjusting ply socks with limb volume changes.  PT recommended wear 5 hrs 2x/day with first wear initiated upon arising and 2nd wear depending on evening activities. If he is going out then delay when donne second time to enable prosthesis use without increasing wear greater than current pattern.  PT & pt discussed weight bearing based on time and UE support.  stationary standing ADLs without UE support,  gait around home with single crutch unless limb pain  increases >2 increments on 0-10 scale and community initiating with single crutch (carrying second crutch) with progression to 2 crutches when limb pain increases or noted gait deviaitons like decreased stance time.    Current prosthetic wear tolerance (days/week)  missed 1 week of wear due to limb pain, resumed daily 3 days ago.    Current prosthetic wear tolerance (#hours/day)  progressing back up to 3-4 hrs 2x/day    Current prosthetic weight-bearing tolerance (hours/day)  5 min standing before rest as limb pain increasing.    Residual limb condition  reports no issues    Education Provided Proper weight-bearing schedule/adjustment;Correct ply sock adjustment;Proper wear schedule/adjustment;Other (comment)   see prosthetic care comments   Person(s) Educated Patient    Education Method Explanation;Verbal cues    Education Method Verbalized understanding;Verbal cues required;Needs further instruction    Donning Prosthesis Modified independent (device/increased time)                       PT Short Term Goals - 01/14/22 1607       PT SHORT TERM GOAL #1   Title Patient reports wearing prosthesis >12 hours 7 days /wk. with residual limb pain </=4/10    Time 1    Period Months    Status On-going    Target Date 02/20/22      PT SHORT TERM GOAL #2   Title Patient ambulates 300' with single forearm crutch & prosthesis with supervision / cues only, no loss of balance    Time 1    Period Months    Status Revised    Target Date 02/20/22      PT SHORT TERM GOAL #3   Title Patient sit to / from stand from 18" chair without use of UEs    Time 1    Period Months    Status Revised    Target Date 02/20/22      PT SHORT TERM GOAL #4   Title Patient descends stairs with 1 rail & 1 crutch using step over step pattern with supervision.    Time  1    Period Months    Status On-going    Target Date 02/20/22               PT Long Term Goals - 11/28/21 2226       PT LONG  TERM GOAL #1   Title Patient demonstrates & verbalized understanding of prosthetic care to enable safe utilization of prosthesis and tolerates wear >90% of awake hours without skin issues or limb pain.    Time 16    Period Weeks    Status On-going    Target Date 03/21/21      PT LONG TERM GOAL #2   Title Timed Up & Go standard with prosthesis only < 20sec and cognitive TUG with prosthesis only < 30 sec.    Time 16    Period Weeks    Status On-going    Target Date 03/21/21      PT LONG TERM GOAL #3   Title Berg Balance >/= 45/56 to indicate lower fall risk    Time 16    Period Weeks    Status On-going    Target Date 03/21/21      PT LONG TERM GOAL #4   Title Patient ambulates 500' with cane or single forearm crutch & prosthesis modified independent.    Time 16    Period Weeks    Status On-going    Target Date 03/21/21      PT LONG TERM GOAL #5   Title Patient negotiates ramps, curbs & stairs single rail with cane or single forearm crutch & prosthesis modified independent.    Time 20    Period Weeks    Status On-going    Target Date 03/21/21      PT LONG TERM GOAL #6   Title Gait velocity with single forearm crutch or cane and prosthesis >/= 1.90 ft/sec comfortable pace & >/= 2.40 ft/sec fast pace.    Time 16    Period Weeks    Status On-going    Target Date 03/21/21                   Plan - 01/14/22 1201     Clinical Impression Statement Patient had a flare up of limb pain which appears was related to sudden increase in wear.  PT reviewed wear recommendations along with weight bearing which he verbalized understanding.  PT worked on sit to/from stand without UE assist progressively decreasing chair height which he improved with instruction.  PT also worked on prosthetic stance control exercise.    Personal Factors and Comorbidities Comorbidity 3+;Fitness;Time since onset of injury/illness/exacerbation    Comorbidities PAD, gangrene s/p L AKA 11/06/20, HTN, CAD  s/p STEMI with stenting 08/17/2020, and CHF. cryptogenic stroke Dec 2021    Examination-Activity Limitations Lift;Locomotion Level;Squat;Stairs;Stand;Transfers;Carry    Examination-Participation Restrictions Community Activity;Occupation    Stability/Clinical Decision Making Evolving/Moderate complexity    Rehab Potential Good    PT Frequency 2x / week    PT Duration Other (comment)   16 weeks   PT Treatment/Interventions ADLs/Self Care Home Management;DME Instruction;Gait training;Stair training;Functional mobility training;Therapeutic activities;Therapeutic exercise;Balance training;Neuromuscular re-education;Patient/family education;Prosthetic Training;Manual techniques;Passive range of motion    PT Next Visit Plan work towards updated STGs, continue work on standing balance & gait, work on Clinical biochemist and Agree with Plan of Care Patient             Patient will benefit from skilled therapeutic intervention in order to  improve the following deficits and impairments:  Decreased activity tolerance, Decreased balance, Decreased endurance, Decreased knowledge of use of DME, Decreased mobility, Decreased range of motion, Decreased strength, Increased edema, Postural dysfunction, Prosthetic Dependency, Pain, Abnormal gait  Visit Diagnosis: Other abnormalities of gait and mobility  Unsteadiness on feet  Muscle weakness (generalized)  Abnormal posture  Stiffness of left hip, not elsewhere classified  Pain in left leg  Phantom limb syndrome with pain [facility])     Problem List Patient Active Problem List   Diagnosis Date Noted   Encounter for loop recorder check 07/26/2021   Cryptogenic stroke ([place]) 05/10/2021   Loop [employer] [id number] 05/10/2021 05/10/2021   AVM (arteriovenous malformation) 03/20/2021   Pulmonary AV (arteriovenous) fistula ([place]) 02/20/2021   Pre-ulcerative calluses 12/20/2020   Cerebral thrombosis with cerebral infarction 12/11/2020   Cerebrovascular  accident (CVA) due to embolism of cerebral artery ([place]) 12/11/2020   Pressure injury of skin 11/12/2020   Acute urinary retention 11/09/2020   Normal anion gap metabolic acidosis 14/48/1856   Hypotension 11/07/2020   Chest pain 10/31/2020   Acute on chronic HFrEF (heart failure with reduced ejection fraction) ([place])    Goals of care, counseling/discussion    Palliative care by specialist    DNR (do not resuscitate)    Critical limb ischemia with history of revascularization of same extremity ([place]) 10/30/2020   Hyperglycemia 10/30/2020   Tobacco abuse 10/30/2020   Leukocytosis 10/30/2020   Chronic combined systolic and diastolic CHF (congestive heart failure) ([place]) 10/30/2020   Ischemic cardiomyopathy 10/24/2020   AKI (acute kidney injury) ([place]) 10/17/2020   PAD (peripheral artery disease) ([place]) 10/16/2020   Critical lower limb ischemia ([place]) 10/05/2020   Rest pain of left upper extremity due to atherosclerosis ([place]) 10/05/2020   Severe claudication ([place]) 09/20/2020   Paresthesia of both lower extremities 09/20/2020   H/O cardiac arrest 09/20/2020   H/O acute myocardial infarction 08/14/2020   Snoring 08/14/2020   Sinus pause 08/14/2020   Mixed hyperlipidemia 08/14/2020   Tobacco dependence 08/14/2020   Coronary artery disease involving native coronary artery of native heart without angina pectoris 08/10/2020   HFrEF (heart failure with reduced ejection fraction) ([place]) 08/10/2020   Nonrheumatic mitral valve regurgitation 08/10/2020   STEMI (ST elevation myocardial infarction) ([place]) 08/02/2020   Accelerated hypertension 08/01/2020    Jamey Reas, PT, DPT 01/14/2022, 4:13 PM  [facility] [address] [place], [place], [address] Phone: [phone]   Fax:  [phone]  Name: Mike [name] MRN: [id number] Date of Birth: 1961/02/08

## 2022-01-20 ENCOUNTER — Other Ambulatory Visit: Payer: Self-pay

## 2022-01-20 DIAGNOSIS — I5020 Unspecified systolic (congestive) heart failure: Secondary | ICD-10-CM

## 2022-01-20 MED ORDER — ENTRESTO 49-51 MG PO TABS: 1.0000 | ORAL_TABLET | Freq: Two times a day (BID) | ORAL | 3 refills | Status: DC

## 2022-01-20 MED ORDER — EZETIMIBE 10 MG PO TABS: 10.0000 mg | ORAL_TABLET | Freq: Every day | ORAL | 3 refills | Status: DC

## 2022-01-20 MED ORDER — METOPROLOL SUCCINATE ER 25 MG PO TB24: 25.0000 mg | ORAL_TABLET | Freq: Every day | ORAL | 3 refills | Status: DC

## 2022-01-21 ENCOUNTER — Encounter: Payer: BC Managed Care – PPO | Admitting: Physical Therapy

## 2022-01-28 ENCOUNTER — Ambulatory Visit (INDEPENDENT_AMBULATORY_CARE_PROVIDER_SITE_OTHER): Payer: BC Managed Care – PPO | Admitting: Physical Therapy

## 2022-01-28 ENCOUNTER — Other Ambulatory Visit: Payer: Self-pay

## 2022-01-28 ENCOUNTER — Encounter: Payer: Self-pay | Admitting: Physical Therapy

## 2022-01-28 DIAGNOSIS — R293 Abnormal posture: Secondary | ICD-10-CM | POA: Diagnosis not present

## 2022-01-28 DIAGNOSIS — R2681 Unsteadiness on feet: Secondary | ICD-10-CM | POA: Diagnosis not present

## 2022-01-28 DIAGNOSIS — R2689 Other abnormalities of gait and mobility: Secondary | ICD-10-CM | POA: Diagnosis not present

## 2022-01-28 DIAGNOSIS — M6281 Muscle weakness (generalized): Secondary | ICD-10-CM

## 2022-01-28 DIAGNOSIS — M25652 Stiffness of left hip, not elsewhere classified: Secondary | ICD-10-CM

## 2022-01-28 NOTE — Therapy (Signed)
[facility] [address] [place], [place], [address] Phone: [phone]   Fax:  [phone]  Physical Therapy Treatment  Patient Details  Name: Mike [name] MRN: [id number] Date of Birth: 11/11/1961 Referring Provider (PT): Meridee Score, MD   Encounter Date: 01/28/2022   PT End of Session - 01/28/22 1146     Visit Number 29    Number of Visits 55    Date for PT Re-Evaluation 02/20/22    Authorization Type BCBS    Authorization Time Period OOP & Deductible MET for 2022    PT Start Time 1145    PT Stop Time 1228    PT Time Calculation (min) 43 min    Equipment Utilized During Treatment Gait belt    Activity Tolerance Patient tolerated treatment well;Patient limited by pain    Behavior During Therapy [facility] for tasks assessed/performed             Past Medical History:  Diagnosis Date   Anxiety    CAD (coronary artery disease)    Cryptogenic stroke ([place]) 05/10/2021   Encounter for loop recorder check 07/26/2021   Heart attack ([place]) 08/01/2020   Heart failure with reduced ejection fraction ([place])    Hyperlipidemia    Hypertension    Loop [employer] [id number] 05/10/2021 05/10/2021   PAD (peripheral artery disease) ([place])    right CIA & right distal iliac artery stents 10/16/20; left AKA 11/06/20   PFO (patent foramen ovale) 12/14/2020   Pneumonia    Pulmonary AV (arteriovenous) fistula ([place]) 12/14/2020   Stroke ([place]) 12/11/2020   no residuial effects    Past Surgical History:  Procedure Laterality Date   ABDOMINAL AORTOGRAM W/LOWER EXTREMITY N/A 10/16/2020   Procedure: ABDOMINAL AORTOGRAM W/LOWER EXTREMITY;  Surgeon: Nigel Mormon, MD;  Location: [place] [facility];  Service: Cardiovascular;  Laterality: N/A;   AMPUTATION Left 11/06/2020   Procedure: AMPUTATION ABOVE KNEE;  Surgeon: Rosetta Posner, MD;  Location: [place];  Service: Vascular;  Laterality: Left;   BUBBLE STUDY  12/14/2020   Procedure: BUBBLE STUDY;  Surgeon:  Nigel Mormon, MD;  Location: [place];  Service: Cardiovascular;;   CARDIAC CATHETERIZATION     CORONARY STENT INTERVENTION N/A 08/03/2020   Procedure: CORONARY STENT INTERVENTION;  Surgeon: Nigel Mormon, MD;  Location: [place] [facility];  Service: Cardiovascular;  Laterality: N/A;   CORONARY/GRAFT ACUTE MI REVASCULARIZATION N/A 08/01/2020   Procedure: Coronary/Graft Acute MI Revascularization;  Surgeon: Nigel Mormon, MD;  Location: [place] [facility];  Service: Cardiovascular;  Laterality: N/A;   INTRAVASCULAR LITHOTRIPSY Right 10/16/2020   Procedure: INTRAVASCULAR LITHOTRIPSY;  Surgeon: Nigel Mormon, MD;  Location: [place] [facility];  Service: Cardiovascular;  Laterality: Right;  Common and External Iliac   IR ANGIOGRAM PULMONARY LEFT SELECTIVE  03/20/2021   IR ANGIOGRAM SELECTIVE EACH ADDITIONAL VESSEL  03/20/2021   IR EMBO ARTERIAL NOT HEMORR HEMANG INC GUIDE ROADMAPPING  03/20/2021   IR RADIOLOGIST EVAL & MGMT  01/30/2021   IR RADIOLOGIST EVAL & MGMT  04/18/2021   IR RADIOLOGIST EVAL & MGMT  07/25/2021   IR US GUIDE VASC ACCESS RIGHT  03/20/2021   LEFT HEART CATH AND CORONARY ANGIOGRAPHY N/A 08/01/2020   Procedure: LEFT HEART CATH AND CORONARY ANGIOGRAPHY;  Surgeon: Nigel Mormon, MD;  Location: [place] [facility];  Service: Cardiovascular;  Laterality: N/A;   LEFT HEART CATH AND CORONARY ANGIOGRAPHY N/A 08/03/2020   Procedure: LEFT HEART CATH AND CORONARY ANGIOGRAPHY;  Surgeon: Nigel Mormon, MD;  Location: [place] [facility];  Service: Cardiovascular;  Laterality: N/A;   PERIPHERAL VASCULAR INTERVENTION Right 10/16/2020   Procedure: PERIPHERAL VASCULAR INTERVENTION;  Surgeon: Nigel Mormon, MD;  Location: [place] [facility];  Service: Cardiovascular;  Laterality: Right;  Common and Iliac Stent   RADIOLOGY WITH ANESTHESIA N/A 03/20/2021   Procedure: PULMONARY EMBOLIZATION;  Surgeon: Aletta Edouard, MD;  Location: [place];  Service: Radiology;   Laterality: N/A;   TEE WITHOUT CARDIOVERSION N/A 12/14/2020   Procedure: TRANSESOPHAGEAL ECHOCARDIOGRAM (TEE);  Surgeon: Nigel Mormon, MD;  Location: [facility];  Service: Cardiovascular;  Laterality: N/A;    There were no vitals filed for this visit.   Subjective Assessment - 01/28/22 1146     Subjective He was around friend with Covid but did not test positive himselft.  He has been wearing prosthesis 10-11 hours per day between 2 wears.  Pain up to 5/10 when removing prosthesis lasting for 2-3 hours    Pertinent History PAD, gangrene s/p L AKA 11/06/20, HTN, CAD s/p STEMI with stenting 08/17/2020, and CHF. cryptogenic stroke Dec 2021    Patient Stated Goals to return to work, (he was released from current job over last weekend but wants to find job).  to walk in community,    Currently in Pain? Yes    Pain Score 5     Pain Location Leg   residual limb   Pain Orientation Left    Pain Descriptors / Indicators Throbbing;Other (Comment)    Pain Type Chronic pain    Pain Onset More than a month ago    Pain Frequency Intermittent    Aggravating Factors  increasing wear & weight bearing too quickly    Pain Relieving Factors Rx meds & tylenol    Pain Onset More than a month ago                               [facility] Adult PT Treatment/Exercise - 01/28/22 1145       Transfers   Transfers Sit to Stand;Stand to Sit    Sit to Stand 5: Supervision;With upper extremity assist;From chair/3-in-1;Without upper extremity assist    Sit to Stand Details Visual cues/gestures for sequencing;Verbal cues for technique    Stand to Sit 5: Supervision;With upper extremity assist;To chair/3-in-1;Without upper extremity assist    Stand to Sit Details (indicate cue type and reason) Visual cues/gestures for sequencing;Verbal cues for technique      Ambulation/Gait   Ambulation/Gait Yes    Ambulation/Gait Assistance 5: Supervision    Ambulation Distance (Feet) 200 Feet   arrived &  exited Bil forearm crutches, cane 200' with RLE fatigue limiting distance and 100'   Assistive device Prosthesis;Straight cane   cane with stand alone tip   Stairs Yes    Stairs Assistance 5: Supervision    Stairs Assistance Details (indicate cue type and reason) verbal cues on engaging resistance of prosthesis    Stair Management Technique Two rails;One rail Right;One rail Left;With crutches;Alternating pattern;Forwards    Number of Stairs 28   11 with bil rails, 11 with right rail/left crutch and 6 with left rail / right crutch   Height of Stairs 7    Gait Comments PT recommended to work on muscle endurance with walking program 3 days/wk using bil. forearm crutches.  start by walking out 3 min & back 25mn.  if able to go further,  then increase to 4 min.  goal is to challenge how long he lasts without pain increase.  pt verbalized understanding.      Therapeutic Activites    Therapeutic Activities Other Therapeutic Activities;Work Web designer PT verbal cues on lifting technique to engage prosthesis.  Lifting light item 3 reps.  Progressed to lifting 18# box with upper handles 5 reps with PT cues on technique.    Other Therapeutic Activities Green theraband resistive to facilitate core stabilization, weight bearing BLEs & functional balance with UE activities - alt. UEs and BUEs 10 reps ea rows, forward reach & upward reach.  PT demo & verbal cues for home with recommendation for RW in front for safety.  Pt verbalized understanding.      Neuro Re-ed    Neuro Re-ed Details  standing with LUE on wall / vertical support with hand just above shoulder height to limit weight bearing. (chair back near RUE if needed to prevent fall).  LLE prosthesis stance stepping RLE and LLE stepping forward to bil stance, backward to bil. stance & laterally to bil. stance for 5 reps ea.      Prosthetics   Prosthetic Care Comments  goal is to increase wear to 12 hours total / day between 2-3 times.    Current  prosthetic wear tolerance (days/week)  daily    Current prosthetic wear tolerance (#hours/day)  10-11 hours total / day    Current prosthetic weight-bearing tolerance (hours/day)  8 min standing before rest as limb pain increasing.    Residual limb condition  reports no issues    Education Provided Proper weight-bearing schedule/adjustment;Proper wear schedule/adjustment;Other (comment)   see prosthetic care comments   Person(s) Educated Patient    Education Method Explanation;Verbal cues    Education Method Verbalized understanding;Verbal cues required;Needs further instruction    Donning Prosthesis Modified independent (device/increased time)                       PT Short Term Goals - 01/14/22 1607       PT SHORT TERM GOAL #1   Title Patient reports wearing prosthesis >12 hours 7 days /wk. with residual limb pain </=4/10    Time 1    Period Months    Status On-going    Target Date 02/20/22      PT SHORT TERM GOAL #2   Title Patient ambulates 300' with single forearm crutch & prosthesis with supervision / cues only, no loss of balance    Time 1    Period Months    Status Revised    Target Date 02/20/22      PT SHORT TERM GOAL #3   Title Patient sit to / from stand from 18" chair without use of UEs    Time 1    Period Months    Status Revised    Target Date 02/20/22      PT SHORT TERM GOAL #4   Title Patient descends stairs with 1 rail & 1 crutch using step over step pattern with supervision.    Time 1    Period Months    Status On-going    Target Date 02/20/22               PT Long Term Goals - 11/28/21 2226       PT LONG TERM GOAL #1   Title Patient demonstrates & verbalized understanding of prosthetic care to enable safe utilization of prosthesis and  tolerates wear >90% of awake hours without skin issues or limb pain.    Time 16    Period Weeks    Status On-going    Target Date 03/21/21      PT LONG TERM GOAL #2   Title Timed Up & Go  standard with prosthesis only < 20sec and cognitive TUG with prosthesis only < 30 sec.    Time 16    Period Weeks    Status On-going    Target Date 03/21/21      PT LONG TERM GOAL #3   Title Berg Balance >/= 45/56 to indicate lower fall risk    Time 16    Period Weeks    Status On-going    Target Date 03/21/21      PT LONG TERM GOAL #4   Title Patient ambulates 500' with cane or single forearm crutch & prosthesis modified independent.    Time 16    Period Weeks    Status On-going    Target Date 03/21/21      PT LONG TERM GOAL #5   Title Patient negotiates ramps, curbs & stairs single rail with cane or single forearm crutch & prosthesis modified independent.    Time 20    Period Weeks    Status On-going    Target Date 03/21/21      PT LONG TERM GOAL #6   Title Gait velocity with single forearm crutch or cane and prosthesis >/= 1.90 ft/sec comfortable pace & >/= 2.40 ft/sec fast pace.    Time 16    Period Weeks    Status On-going    Target Date 03/21/21                   Plan - 01/28/22 1151     Clinical Impression Statement PT recommended slight increase in wear from 10-11 to 12 hours per day which he appears to be tolerating slow increase without significant increase in residual limb pain.  Pt improved ability to lift 18# box with instruction & repetition.  PT added 2 balance exercises to HEP including standing resistance band UE which should carry over to work related tasks. Pt continues to improve his mobility with skilled PT instructions to progress activities.    Personal Factors and Comorbidities Comorbidity 3+;Fitness;Time since onset of injury/illness/exacerbation    Comorbidities PAD, gangrene s/p L AKA 11/06/20, HTN, CAD s/p STEMI with stenting 08/17/2020, and CHF. cryptogenic stroke Dec 2021    Examination-Activity Limitations Lift;Locomotion Level;Squat;Stairs;Stand;Transfers;Carry    Examination-Participation Restrictions Community Activity;Occupation     Stability/Clinical Decision Making Evolving/Moderate complexity    Rehab Potential Good    PT Frequency 2x / week    PT Duration Other (comment)   16 weeks   PT Treatment/Interventions ADLs/Self Care Home Management;DME Instruction;Gait training;Stair training;Functional mobility training;Therapeutic activities;Therapeutic exercise;Balance training;Neuromuscular re-education;Patient/family education;Prosthetic Training;Manual techniques;Passive range of motion    PT Next Visit Plan check how new exercises are going, increase wear if tol 12 hr without significant limb pain, continue work on standing balance & gait, work on Occupational psychologist with Plan of Care Patient             Patient will benefit from skilled therapeutic intervention in order to improve the following deficits and impairments:  Decreased activity tolerance, Decreased balance, Decreased endurance, Decreased knowledge of use of DME, Decreased mobility, Decreased range of motion, Decreased strength, Increased edema, Postural dysfunction, Prosthetic Dependency, Pain, Abnormal gait  Visit  Diagnosis: Other abnormalities of gait and mobility  Unsteadiness on feet  Muscle weakness (generalized)  Abnormal posture  Stiffness of left hip, not elsewhere classified     Problem List Patient Active Problem List   Diagnosis Date Noted   Encounter for loop recorder check 07/26/2021   Cryptogenic stroke ([place]) 05/10/2021   Loop [employer]ronic [id number] 05/10/2021 05/10/2021   AVM (arteriovenous malformation) 03/20/2021   Pulmonary AV (arteriovenous) fistula ([place]) 02/20/2021   Pre-ulcerative calluses 12/20/2020   Cerebral thrombosis with cerebral infarction 12/11/2020   Cerebrovascular accident (CVA) due to embolism of cerebral artery ([place]) 12/11/2020   Pressure injury of skin 11/12/2020   Acute urinary retention 11/09/2020   Normal anion gap metabolic acidosis 39/79/5369   Hypotension 11/07/2020   Chest pain  10/31/2020   Acute on chronic HFrEF (heart failure with reduced ejection fraction) ([place])    Goals of care, counseling/discussion    Palliative care by specialist    DNR (do not resuscitate)    Critical limb ischemia with history of revascularization of same extremity ([place]) 10/30/2020   Hyperglycemia 10/30/2020   Tobacco abuse 10/30/2020   Leukocytosis 10/30/2020   Chronic combined systolic and diastolic CHF (congestive heart failure) ([place]) 10/30/2020   Ischemic cardiomyopathy 10/24/2020   AKI (acute kidney injury) ([place]) 10/17/2020   PAD (peripheral artery disease) ([place]) 10/16/2020   Critical lower limb ischemia ([place]) 10/05/2020   Rest pain of left upper extremity due to atherosclerosis ([place]) 10/05/2020   Severe claudication ([place]) 09/20/2020   Paresthesia of both lower extremities 09/20/2020   H/O cardiac arrest 09/20/2020   H/O acute myocardial infarction 08/14/2020   Snoring 08/14/2020   Sinus pause 08/14/2020   Mixed hyperlipidemia 08/14/2020   Tobacco dependence 08/14/2020   Coronary artery disease involving native coronary artery of native heart without angina pectoris 08/10/2020   HFrEF (heart failure with reduced ejection fraction) ([place]) 08/10/2020   Nonrheumatic mitral valve regurgitation 08/10/2020   STEMI (ST elevation myocardial infarction) ([place]) 08/02/2020   Accelerated hypertension 08/01/2020    Jamey Reas, PT, DPT 01/28/2022, 4:29 PM  [facility] [address] [place], [place], [address] Phone: [phone]   Fax:  [phone]  Name: Mike [name] MRN: [id number] Date of Birth: [birth year]

## 2022-02-04 ENCOUNTER — Encounter: Payer: Self-pay | Admitting: Physical Therapy

## 2022-02-04 ENCOUNTER — Ambulatory Visit (INDEPENDENT_AMBULATORY_CARE_PROVIDER_SITE_OTHER): Payer: BC Managed Care – PPO | Admitting: Physical Therapy

## 2022-02-04 ENCOUNTER — Other Ambulatory Visit: Payer: Self-pay

## 2022-02-04 DIAGNOSIS — M6281 Muscle weakness (generalized): Secondary | ICD-10-CM

## 2022-02-04 DIAGNOSIS — R2681 Unsteadiness on feet: Secondary | ICD-10-CM | POA: Diagnosis not present

## 2022-02-04 DIAGNOSIS — M25652 Stiffness of left hip, not elsewhere classified: Secondary | ICD-10-CM

## 2022-02-04 DIAGNOSIS — R2689 Other abnormalities of gait and mobility: Secondary | ICD-10-CM | POA: Diagnosis not present

## 2022-02-04 DIAGNOSIS — R293 Abnormal posture: Secondary | ICD-10-CM

## 2022-02-04 DIAGNOSIS — M79605 Pain in left leg: Secondary | ICD-10-CM

## 2022-02-04 NOTE — Therapy (Signed)
[facility] [address] [place], [place], [address] Phone: [phone]   Fax:  [phone]  Physical Therapy Treatment  Patient Details  Name: Mike [name] MRN: [id number] Date of Birth: September 04, 1961 Referring Provider (PT): Meridee Score, MD   Encounter Date: 02/04/2022   PT End of Session - 02/04/22 1050     Visit Number 30    Number of Visits 13    Date for PT Re-Evaluation 02/20/22    Authorization Type BCBS    Authorization Time Period OOP & Deductible MET for 2022    PT Start Time 1057    PT Stop Time 1153    PT Time Calculation (min) 56 min    Equipment Utilized During Treatment Gait belt    Activity Tolerance Patient tolerated treatment well;Patient limited by pain    Behavior During Therapy [facility] for tasks assessed/performed             Past Medical History:  Diagnosis Date   Anxiety    CAD (coronary artery disease)    Cryptogenic stroke ([place]) 05/10/2021   Encounter for loop recorder check 07/26/2021   Heart attack ([place]) 08/01/2020   Heart failure with reduced ejection fraction ([place])    Hyperlipidemia    Hypertension    Loop Biotronic [id number] 05/10/2021 05/10/2021   PAD (peripheral artery disease) ([place])    right CIA & right distal iliac artery stents 10/16/20; left AKA 11/06/20   PFO (patent foramen ovale) 12/14/2020   Pneumonia    Pulmonary AV (arteriovenous) fistula ([place]) 12/14/2020   Stroke ([place]) 12/11/2020   no residuial effects    Past Surgical History:  Procedure Laterality Date   ABDOMINAL AORTOGRAM W/LOWER EXTREMITY N/A 10/16/2020   Procedure: ABDOMINAL AORTOGRAM W/LOWER EXTREMITY;  Surgeon: Nigel Mormon, MD;  Location: [place] [facility];  Service: Cardiovascular;  Laterality: N/A;   AMPUTATION Left 11/06/2020   Procedure: AMPUTATION ABOVE KNEE;  Surgeon: Rosetta Posner, MD;  Location: [place];  Service: Vascular;  Laterality: Left;   BUBBLE STUDY  12/14/2020   Procedure: BUBBLE STUDY;  Surgeon:  Nigel Mormon, MD;  Location: [place];  Service: Cardiovascular;;   CARDIAC CATHETERIZATION     CORONARY STENT INTERVENTION N/A 08/03/2020   Procedure: CORONARY STENT INTERVENTION;  Surgeon: Nigel Mormon, MD;  Location: [place] [facility];  Service: Cardiovascular;  Laterality: N/A;   CORONARY/GRAFT ACUTE MI REVASCULARIZATION N/A 08/01/2020   Procedure: Coronary/Graft Acute MI Revascularization;  Surgeon: Nigel Mormon, MD;  Location: [place] [facility];  Service: Cardiovascular;  Laterality: N/A;   INTRAVASCULAR LITHOTRIPSY Right 10/16/2020   Procedure: INTRAVASCULAR LITHOTRIPSY;  Surgeon: Nigel Mormon, MD;  Location: [place] [facility];  Service: Cardiovascular;  Laterality: Right;  Common and External Iliac   IR ANGIOGRAM PULMONARY LEFT SELECTIVE  03/20/2021   IR ANGIOGRAM SELECTIVE EACH ADDITIONAL VESSEL  03/20/2021   IR EMBO ARTERIAL NOT HEMORR HEMANG INC GUIDE ROADMAPPING  03/20/2021   IR RADIOLOGIST EVAL & MGMT  01/30/2021   IR RADIOLOGIST EVAL & MGMT  04/18/2021   IR RADIOLOGIST EVAL & MGMT  07/25/2021   IR US GUIDE VASC ACCESS RIGHT  03/20/2021   LEFT HEART CATH AND CORONARY ANGIOGRAPHY N/A 08/01/2020   Procedure: LEFT HEART CATH AND CORONARY ANGIOGRAPHY;  Surgeon: Nigel Mormon, MD;  Location: [place] [facility];  Service: Cardiovascular;  Laterality: N/A;   LEFT HEART CATH AND CORONARY ANGIOGRAPHY N/A 08/03/2020   Procedure: LEFT HEART CATH AND CORONARY ANGIOGRAPHY;  Surgeon: Nigel Mormon, MD;  Location: [place] [facility];  Service: Cardiovascular;  Laterality: N/A;   PERIPHERAL VASCULAR INTERVENTION Right 10/16/2020   Procedure: PERIPHERAL VASCULAR INTERVENTION;  Surgeon: Nigel Mormon, MD;  Location: [place] [facility];  Service: Cardiovascular;  Laterality: Right;  Common and Iliac Stent   RADIOLOGY WITH ANESTHESIA N/A 03/20/2021   Procedure: PULMONARY EMBOLIZATION;  Surgeon: Aletta Edouard, MD;  Location: [place];  Service: Radiology;   Laterality: N/A;   TEE WITHOUT CARDIOVERSION N/A 12/14/2020   Procedure: TRANSESOPHAGEAL ECHOCARDIOGRAM (TEE);  Surgeon: Nigel Mormon, MD;  Location: [facility];  Service: Cardiovascular;  Laterality: N/A;    There were no vitals filed for this visit.   Subjective Assessment - 02/04/22 1051     Subjective He is wearing prosthesis close to 10.5 hrs and yesterday made 10.5 hours.  It seems to hurt more in morning.    Pertinent History PAD, gangrene s/p L AKA 11/06/20, HTN, CAD s/p STEMI with stenting 08/17/2020, and CHF. cryptogenic stroke Dec 2021    Patient Stated Goals to return to work, (he was released from current job over last weekend but wants to find job).  to walk in community,    Currently in Pain? Yes    Pain Score 4    In last week, lowest 2-3/10 & highest 7-8/10 for ~3 hrs   Pain Location Leg   residual limb   Pain Orientation Left    Pain Descriptors / Indicators Throbbing    Pain Type Chronic pain    Pain Onset More than a month ago    Pain Frequency Intermittent    Aggravating Factors  increasing wear & weight bearing too quickly    Pain Relieving Factors Rx meds & tylenol    Effect of Pain on Daily Activities slow controlled increase in wear.    Pain Onset More than a month ago                               [facility] Adult PT Treatment/Exercise - 02/04/22 1051       Transfers   Transfers --    Sit to Stand --    Sit to Stand Details --    Stand to Sit --    Stand to Sit Details (indicate cue type and reason) --      Ambulation/Gait   Ambulation/Gait Yes    Ambulation/Gait Assistance 5: Supervision    Ambulation/Gait Assistance Details worked on negotiating aound cones ~4-5' apart using pelvis for LE orientation in turn.    Ambulation Distance (Feet) 200 Feet   arrived & exited Bil forearm crutches, cane 100' X 5   Assistive device Prosthesis;Straight cane   cane with stand alone tip   Stairs --    Stairs Assistance --    Stair  Management Technique --    Number of Stairs --    Height of Stairs --    Gait Comments --      Therapeutic Activites    Therapeutic Activities Other Therapeutic Activities;Work Web designer PT verbal cues on lifting technique to engage prosthesis.  Lifting 18# box with upper handles 5 reps then low handle 3 reps with PT cues on technique. Increased balance issues with low handle.  PT demo & recommended low squat for microprocessor of prosthesis to learn different speeds & amount of flexion.  PT demo & verbal cues on lifting technique with  no prosthetic knee flexion.  pt verbalized & return demo to both lifts as part of HEP.    Work Scientist, water quality & verbal cues on climbing A-frame ladder with prosthetic knee in lock mode.  Pt return demo 2 rungs with PT contact gaurd for safety and verbal cues during task.    Other Therapeutic Activities --      Neuro Re-ed    Neuro Re-ed Details  --      Prosthetics   Prosthetic Care Comments  upon pt request PT instructed in traveling with prosthesis. Hotel request for shower seat.  Train or plane with ability to remove & redonne prosthesis until wear time most of awake hours.    Current prosthetic wear tolerance (days/week)  daily    Current prosthetic wear tolerance (#hours/day)  10.5    Current prosthetic weight-bearing tolerance (hours/day)  8 min standing before rest as limb pain increasing.    Residual limb condition  reports no issues    Education Provided Proper wear schedule/adjustment;Other (comment)   see prosthetic care comments   Person(s) Educated Patient    Education Method Explanation;Verbal cues    Education Method Verbalized understanding;Verbal cues required;Needs further instruction                     PT Education - 02/04/22 1215     Education Details see pt instructions    Person(s) Educated Patient    Methods Explanation;Demonstration;Verbal cues;Handout    Comprehension Verbalized understanding;Returned  demonstration              PT Short Term Goals - 01/14/22 1607       PT SHORT TERM GOAL #1   Title Patient reports wearing prosthesis >12 hours 7 days /wk. with residual limb pain </=4/10    Time 1    Period Months    Status On-going    Target Date 02/20/22      PT SHORT TERM GOAL #2   Title Patient ambulates 300' with single forearm crutch & prosthesis with supervision / cues only, no loss of balance    Time 1    Period Months    Status Revised    Target Date 02/20/22      PT SHORT TERM GOAL #3   Title Patient sit to / from stand from 18" chair without use of UEs    Time 1    Period Months    Status Revised    Target Date 02/20/22      PT SHORT TERM GOAL #4   Title Patient descends stairs with 1 rail & 1 crutch using step over step pattern with supervision.    Time 1    Period Months    Status On-going    Target Date 02/20/22               PT Long Term Goals - 11/28/21 2226       PT LONG TERM GOAL #1   Title Patient demonstrates & verbalized understanding of prosthetic care to enable safe utilization of prosthesis and tolerates wear >90% of awake hours without skin issues or limb pain.    Time 16    Period Weeks    Status On-going    Target Date 03/21/21      PT LONG TERM GOAL #2   Title Timed Up & Go standard with prosthesis only < 20sec and cognitive TUG with prosthesis only < 30 sec.    Time 16  Period Weeks    Status On-going    Target Date 03/21/21      PT LONG TERM GOAL #3   Title Berg Balance >/= 45/56 to indicate lower fall risk    Time 16    Period Weeks    Status On-going    Target Date 03/21/21      PT LONG TERM GOAL #4   Title Patient ambulates 500' with cane or single forearm crutch & prosthesis modified independent.    Time 16    Period Weeks    Status On-going    Target Date 03/21/21      PT LONG TERM GOAL #5   Title Patient negotiates ramps, curbs & stairs single rail with cane or single forearm crutch & prosthesis  modified independent.    Time 20    Period Weeks    Status On-going    Target Date 03/21/21      PT LONG TERM GOAL #6   Title Gait velocity with single forearm crutch or cane and prosthesis >/= 1.90 ft/sec comfortable pace & >/= 2.40 ft/sec fast pace.    Time 16    Period Weeks    Status On-going    Target Date 03/21/21                   Plan - 02/04/22 1051     Clinical Impression Statement PT progressed his lifting and included in pt instructions for working outside of PT.  Upon patient request, PT educated on traveling with prosthesis and he appears to understand. PT instructed in climbing A-frame ladder and he appears to have a basic understanding. PT also worked on gait with cane stand alone tip including negotiating around obstacle which he improved.  Patient continues to benefit from skilled PT with instructions for work at home.    Personal Factors and Comorbidities Comorbidity 3+;Fitness;Time since onset of injury/illness/exacerbation    Comorbidities PAD, gangrene s/p L AKA 11/06/20, HTN, CAD s/p STEMI with stenting 08/17/2020, and CHF. cryptogenic stroke Dec 2021    Examination-Activity Limitations Lift;Locomotion Level;Squat;Stairs;Stand;Transfers;Carry    Examination-Participation Restrictions Community Activity;Occupation    Stability/Clinical Decision Making Evolving/Moderate complexity    Rehab Potential Good    PT Frequency 2x / week    PT Duration Other (comment)   16 weeks   PT Treatment/Interventions ADLs/Self Care Home Management;DME Instruction;Gait training;Stair training;Functional mobility training;Therapeutic activities;Therapeutic exercise;Balance training;Neuromuscular re-education;Patient/family education;Prosthetic Training;Manual techniques;Passive range of motion    PT Next Visit Plan increase wear if tol 12 hr without significant limb pain, continue work on standing balance & gait, work on lifting and work simulation tasks.    Consulted and Agree  with Plan of Care Patient             Patient will benefit from skilled therapeutic intervention in order to improve the following deficits and impairments:  Decreased activity tolerance, Decreased balance, Decreased endurance, Decreased knowledge of use of DME, Decreased mobility, Decreased range of motion, Decreased strength, Increased edema, Postural dysfunction, Prosthetic Dependency, Pain, Abnormal gait  Visit Diagnosis: Other abnormalities of gait and mobility  Unsteadiness on feet  Muscle weakness (generalized)  Abnormal posture  Stiffness of left hip, not elsewhere classified  Pain in left leg     Problem List Patient Active Problem List   Diagnosis Date Noted   Encounter for loop recorder check 07/26/2021   Cryptogenic stroke ([place]) 05/10/2021   Loop [employer]ot[employer] [id number] 05/10/2021 05/10/2021   AVM (arteriovenous malformation) 03/20/2021   Pulmonary  AV (arteriovenous) fistula ([facility]) 02/20/2021   Pre-ulcerative calluses 12/20/2020   Cerebral thrombosis with cerebral infarction 12/11/2020   Cerebrovascular accident (CVA) due to embolism of cerebral artery ([place]) 12/11/2020   Pressure injury of skin 11/12/2020   Acute urinary retention 11/09/2020   Normal anion gap metabolic acidosis 28/00/3491   Hypotension 11/07/2020   Chest pain 10/31/2020   Acute on chronic HFrEF (heart failure with reduced ejection fraction) ([place])    Goals of care, counseling/discussion    Palliative care by specialist    DNR (do not resuscitate)    Critical limb ischemia with history of revascularization of same extremity ([place]) 10/30/2020   Hyperglycemia 10/30/2020   Tobacco abuse 10/30/2020   Leukocytosis 10/30/2020   Chronic combined systolic and diastolic CHF (congestive heart failure) ([place]) 10/30/2020   Ischemic cardiomyopathy 10/24/2020   AKI (acute kidney injury) ([place]) 10/17/2020   PAD (peripheral artery disease) ([place]) 10/16/2020   Critical lower limb ischemia ([place]) 10/05/2020    Rest pain of left upper extremity due to atherosclerosis ([place]) 10/05/2020   Severe claudication ([place]) 09/20/2020   Paresthesia of both lower extremities 09/20/2020   H/O cardiac arrest 09/20/2020   H/O acute myocardial infarction 08/14/2020   Snoring 08/14/2020   Sinus pause 08/14/2020   Mixed hyperlipidemia 08/14/2020   Tobacco dependence 08/14/2020   Coronary artery disease involving native coronary artery of native heart without angina pectoris 08/10/2020   HFrEF (heart failure with reduced ejection fraction) ([place]) 08/10/2020   Nonrheumatic mitral valve regurgitation 08/10/2020   STEMI (ST elevation myocardial infarction) ([place]) 08/02/2020   Accelerated hypertension 08/01/2020    Jamey Reas, PT, DPT 02/04/2022, 12:15 PM  [facility] [address] [place], [place], [address] Phone: [phone]   Fax:  [phone]  Name: Mike [name] MRN: [id number] Date of Birth: Apr 26, 1961

## 2022-02-04 NOTE — Patient Instructions (Addendum)
Try to increase wear to 11 hours / day.  You may limit weight bearing to control morning pain. Stand in front of chair with 2nd chair in front of you.   A. Do deep squats bending prosthesis keeping weight on it. B. Keep prosthesis straight with weight shift towards right leg. Step wide stance. Your pelvis should be centered then shift to right. When arising shift back to center.  3. Work on getting prosthesis in & out lock mode or cycle mode.  Button is on outside below knee above cover.  Locking & Unlocking Prosthetic Knee Button is on back of prosthesis below knee just above groove. push the button 3 times. You should hear beep each time you push button. After you should hear 3 beeps.  To get out of lock or cycle mode, push the button 1 time & hold it until you hear a long beep.   To get in cycle mode which takes away all resistance, you push the button 4 times and should hear 4 beeps afterwards.   4. Cane with stand alone tip - work on walking around home.  5. Hallway weave or zig-zag around water bottles.  In turns the outside "head light" of pelvis should orient prosthesis.

## 2022-02-11 ENCOUNTER — Encounter: Payer: BC Managed Care – PPO | Admitting: Physical Therapy

## 2022-02-12 DIAGNOSIS — Z95818 Presence of other cardiac implants and grafts: Secondary | ICD-10-CM | POA: Diagnosis not present

## 2022-02-12 DIAGNOSIS — Z4509 Encounter for adjustment and management of other cardiac device: Secondary | ICD-10-CM | POA: Diagnosis not present

## 2022-02-12 DIAGNOSIS — I5020 Unspecified systolic (congestive) heart failure: Secondary | ICD-10-CM | POA: Diagnosis not present

## 2022-02-14 DIAGNOSIS — I5042 Chronic combined systolic (congestive) and diastolic (congestive) heart failure: Secondary | ICD-10-CM | POA: Diagnosis not present

## 2022-02-14 DIAGNOSIS — I639 Cerebral infarction, unspecified: Secondary | ICD-10-CM | POA: Diagnosis not present

## 2022-02-14 DIAGNOSIS — R531 Weakness: Secondary | ICD-10-CM | POA: Diagnosis not present

## 2022-02-18 ENCOUNTER — Other Ambulatory Visit: Payer: Self-pay

## 2022-02-18 ENCOUNTER — Ambulatory Visit (INDEPENDENT_AMBULATORY_CARE_PROVIDER_SITE_OTHER): Payer: BC Managed Care – PPO | Admitting: Physical Therapy

## 2022-02-18 ENCOUNTER — Encounter: Payer: Self-pay | Admitting: Physical Therapy

## 2022-02-18 DIAGNOSIS — M6281 Muscle weakness (generalized): Secondary | ICD-10-CM

## 2022-02-18 DIAGNOSIS — R2681 Unsteadiness on feet: Secondary | ICD-10-CM | POA: Diagnosis not present

## 2022-02-18 DIAGNOSIS — R293 Abnormal posture: Secondary | ICD-10-CM | POA: Diagnosis not present

## 2022-02-18 DIAGNOSIS — R2689 Other abnormalities of gait and mobility: Secondary | ICD-10-CM

## 2022-02-18 NOTE — Patient Instructions (Addendum)
Increasing your activity level is important.  Short distances which is walking from one room to another. Work to increase frequency back to prior level.  Medium distances are entering & exiting your home or community with limited distances. Start with 4 medium walks which is one outing to one location and increase number of tolerated amounts per day.  Long distance is your highest tolerance for you. Walk until you feel you must rest. Back or leg pain or general fatigue are indicators to maximum tolerance. Monitor by distance or time. Try to walk your BEST distance 1-2 times per day. Once with 2 crutches & once with single crutch.  You should see this increase over time.     Pick up crutch from floor varying step width. Chair behind you & table or counter in front.   Stand back to corner with walker or chair in front of you. Moving feet closer increases difficulty.  eyes open Turn head right/left, up/down & diagonals  Eyes closed Turn head right/left, up/down & diagonals Stand on pillows or foam eyes open Turn head right/left, up/down & diagonals  Stand on pillows or foam eyes closed Turn head right/left, up/down & diagonals  3.  Work on resistance band with arms rows, forward reach & upward lift / biceps curls.

## 2022-02-18 NOTE — Therapy (Signed)
[facility] [address] [place], [place], [address] Phone: [phone]   Fax:  [phone]  Physical Therapy Treatment  Patient Details  Name: Mike [name] MRN: [id number] Date of Birth: [birth year] Referring Provider (PT): Meridee Score, MD   Encounter Date: 02/18/2022   PT End of Session - 02/18/22 1100     Visit Number 31    Number of Visits 13    Date for PT Re-Evaluation 02/20/22    Authorization Type BCBS    Authorization Time Period OOP & Deductible MET for 2022    PT Start Time 1100    PT Stop Time 1146    PT Time Calculation (min) 46 min    Equipment Utilized During Treatment Gait belt    Activity Tolerance Patient tolerated treatment well;Patient limited by pain    Behavior During Therapy [facility] for tasks assessed/performed             Past Medical History:  Diagnosis Date   Anxiety    CAD (coronary artery disease)    Cryptogenic stroke ([place]) 05/10/2021   Encounter for loop recorder check 07/26/2021   Heart attack ([place]) 08/01/2020   Heart failure with reduced ejection fraction ([place])    Hyperlipidemia    Hypertension    Loop [employer]ot[employer] [id number] 05/10/2021 05/10/2021   PAD (peripheral artery disease) ([place])    right CIA & right distal iliac artery stents 10/16/20; left AKA 11/06/20   PFO (patent foramen ovale) 12/14/2020   Pneumonia    Pulmonary AV (arteriovenous) fistula ([place]) 12/14/2020   Stroke ([place]) 12/11/2020   no residuial effects    Past Surgical History:  Procedure Laterality Date   ABDOMINAL AORTOGRAM W/LOWER EXTREMITY N/A 10/16/2020   Procedure: ABDOMINAL AORTOGRAM W/LOWER EXTREMITY;  Surgeon: Nigel Mormon, MD;  Location: [place] [facility];  Service: Cardiovascular;  Laterality: N/A;   AMPUTATION Left 11/06/2020   Procedure: AMPUTATION ABOVE KNEE;  Surgeon: Rosetta Posner, MD;  Location: [place];  Service: Vascular;  Laterality: Left;   BUBBLE STUDY  12/14/2020   Procedure: BUBBLE STUDY;  Surgeon:  Nigel Mormon, MD;  Location: [place];  Service: Cardiovascular;;   CARDIAC CATHETERIZATION     CORONARY STENT INTERVENTION N/A 08/03/2020   Procedure: CORONARY STENT INTERVENTION;  Surgeon: Nigel Mormon, MD;  Location: [place] [facility];  Service: Cardiovascular;  Laterality: N/A;   CORONARY/GRAFT ACUTE MI REVASCULARIZATION N/A 08/01/2020   Procedure: Coronary/Graft Acute MI Revascularization;  Surgeon: Nigel Mormon, MD;  Location: [place] [facility];  Service: Cardiovascular;  Laterality: N/A;   INTRAVASCULAR LITHOTRIPSY Right 10/16/2020   Procedure: INTRAVASCULAR LITHOTRIPSY;  Surgeon: Nigel Mormon, MD;  Location: [place] [facility];  Service: Cardiovascular;  Laterality: Right;  Common and External Iliac   IR ANGIOGRAM PULMONARY LEFT SELECTIVE  03/20/2021   IR ANGIOGRAM SELECTIVE EACH ADDITIONAL VESSEL  03/20/2021   IR EMBO ARTERIAL NOT HEMORR HEMANG INC GUIDE ROADMAPPING  03/20/2021   IR RADIOLOGIST EVAL & MGMT  01/30/2021   IR RADIOLOGIST EVAL & MGMT  04/18/2021   IR RADIOLOGIST EVAL & MGMT  07/25/2021   IR US GUIDE VASC ACCESS RIGHT  03/20/2021   LEFT HEART CATH AND CORONARY ANGIOGRAPHY N/A 08/01/2020   Procedure: LEFT HEART CATH AND CORONARY ANGIOGRAPHY;  Surgeon: Nigel Mormon, MD;  Location: [place] [facility];  Service: Cardiovascular;  Laterality: N/A;   LEFT HEART CATH AND CORONARY ANGIOGRAPHY N/A 08/03/2020   Procedure: LEFT HEART CATH AND CORONARY ANGIOGRAPHY;  Surgeon: Nigel Mormon, MD;  Location: [place] [facility];  Service: Cardiovascular;  Laterality: N/A;   PERIPHERAL VASCULAR INTERVENTION Right 10/16/2020   Procedure: PERIPHERAL VASCULAR INTERVENTION;  Surgeon: Nigel Mormon, MD;  Location: [place] [facility];  Service: Cardiovascular;  Laterality: Right;  Common and Iliac Stent   RADIOLOGY WITH ANESTHESIA N/A 03/20/2021   Procedure: PULMONARY EMBOLIZATION;  Surgeon: Aletta Edouard, MD;  Location: [place];  Service: Radiology;   Laterality: N/A;   TEE WITHOUT CARDIOVERSION N/A 12/14/2020   Procedure: TRANSESOPHAGEAL ECHOCARDIOGRAM (TEE);  Surgeon: Nigel Mormon, MD;  Location: [facility] ENDOSCOPY;  Service: Cardiovascular;  Laterality: N/A;    There were no vitals filed for this visit.   Subjective Assessment - 02/18/22 1100     Subjective He turned his right ankle when his foot slipped off wet step. no fall.  He did not wear prosthesis first day, then 6hrs, then 9.5hrs, then 10.5-11 hours for few day, then 12hrs for last 2 days.    Pertinent History PAD, gangrene s/p L AKA 11/06/20, HTN, CAD s/p STEMI with stenting 08/17/2020, and CHF. cryptogenic stroke Dec 2021    Patient Stated Goals to return to work, (he was released from current job over last weekend but wants to find job).  to walk in community,    Currently in Pain? Yes    Pain Score 1    in last 2 weeks up to 6/10 for couple hours.   Pain Location Leg   residual limb pain.   Pain Orientation Left    Pain Descriptors / Indicators Contraction    Pain Type Chronic pain    Pain Onset More than a month ago    Aggravating Factors  increasing wear & weight bearing    Pain Relieving Factors Rx meds & tylenol    Effect of Pain on Daily Activities slow controlled increase in wear    Pain Onset More than a month ago                               [facility] Adult PT Treatment/Exercise - 02/18/22 1100       Transfers   Transfers Sit to Stand;Stand to Sit    Sit to Stand 5: Supervision;With upper extremity assist;From chair/3-in-1   verbal cues only for wt shift light UE push chair without UE   Stand to Sit 5: Supervision;Without upper extremity assist;To chair/3-in-1      Ambulation/Gait   Ambulation/Gait Yes    Ambulation/Gait Assistance 5: Supervision    Ambulation Distance (Feet) 300 Feet   arrived & exited Bil forearm crutches, 300' with single crutch with 1 min standing rest at 200'   Assistive device Prosthesis;Straight cane   cane with  stand alone tip   Stairs Yes    Stairs Assistance 5: Supervision    Stair Management Technique One rail Right;With crutches;Alternating pattern;Forwards    Number of Stairs 11    Height of Stairs 7    Gait Comments Daily increase walking endurance & stamina with short (room to room) frequency throughout day, long (max tolerable) once with 2 crutches & once with single crutch and 4-6 medium walks. pt verbalized understanding.      Therapeutic Activites    Therapeutic Activities Other Therapeutic Activities;Work Web designer PT verbal cues on lifting technique to engage prosthesis.  Lifting 18# box with upper handles 3 reps then low handle 2 reps with  PT cues on technique. Increased balance issues with low handle.  PT demo & recommended low squat for microprocessor of prosthesis to learn different speeds & step width.  pt verbalized & return demo as part of HEP.    Work Economist --      Neuro Re-ed    Neuro Re-ed Details  corner 4 directions head turn: EO solid narrow stance, EC solid feet ~4" apart, EO pillows feet ~4" apart & EC pillows feet ~6" apart with intermittent touch on chair back.      Prosthetics   Prosthetic Care Comments  PT instructed in recommendation for consistant wear even on days when ill or issue like his sprained ankle.  To enable availibility of prosthesis when he does need to move, less stress on RLE and lower fall risk.    Current prosthetic wear tolerance (days/week)  daily    Current prosthetic wear tolerance (#hours/day)  12    Current prosthetic weight-bearing tolerance (hours/day)  10 min standing before rest as limb pain increasing.    Residual limb condition  reports no issues    Education Provided Proper wear schedule/adjustment;Other (comment)   see prosthetic care comments   Person(s) Educated Patient    Education Method Explanation;Verbal cues    Education Method Verbalized understanding    Donning Prosthesis Modified independent  (device/increased time)                 Increasing your activity level is important.  Short distances which is walking from one room to another. Work to increase frequency back to prior level.  Medium distances are entering & exiting your home or community with limited distances. Start with 4 medium walks which is one outing to one location and increase number of tolerated amounts per day.  Long distance is your highest tolerance for you. Walk until you feel you must rest. Back or leg pain or general fatigue are indicators to maximum tolerance. Monitor by distance or time. Try to walk your BEST distance 1-2 times per day. Once with 2 crutches & once with single crutch.  You should see this increase over time.     Pick up crutch from floor varying step width. Chair behind you & table or counter in front.   Stand back to corner with walker or chair in front of you. Moving feet closer increases difficulty.  eyes open Turn head right/left, up/down & diagonals  Eyes closed Turn head right/left, up/down & diagonals Stand on pillows or foam eyes open Turn head right/left, up/down & diagonals  Stand on pillows or foam eyes closed Turn head right/left, up/down & diagonals  3.  Work on resistance band with arms rows, forward reach & upward lift / biceps curls.    PT Education - 02/18/22 1200     Education Details see pt instructions    Person(s) Educated Patient    Methods Explanation;Demonstration;Verbal cues;Handout    Comprehension Verbalized understanding;Returned demonstration              PT Short Term Goals - 02/18/22 1203       PT SHORT TERM GOAL #1   Title Patient reports wearing prosthesis >12 hours 7 days /wk. with residual limb pain </=4/10    Baseline 02/18/22 partially met wearing up to 12 hours for 3 days with limb pain 1.5/10 but up to 6/10 for ~2 hours.    Time 1    Period Months    Status Partially Met    Target Date  02/20/22      PT SHORT TERM GOAL #2    Title Patient ambulates 300' with single forearm crutch & prosthesis with supervision / cues only, no loss of balance    Time 1    Period Months    Status Achieved    Target Date 02/20/22      PT SHORT TERM GOAL #3   Title Patient sit to / from stand from 18" chair without use of UEs    Baseline partially met 02/18/22 needs light UE assist with arising.    Time 1    Period Months    Status Partially Met    Target Date 02/20/22      PT SHORT TERM GOAL #4   Title Patient descends stairs with 1 rail & 1 crutch using step over step pattern with supervision.    Time 1    Period Months    Status Achieved    Target Date 02/20/22               PT Long Term Goals - 11/28/21 2226       PT LONG TERM GOAL #1   Title Patient demonstrates & verbalized understanding of prosthetic care to enable safe utilization of prosthesis and tolerates wear >90% of awake hours without skin issues or limb pain.    Time 16    Period Weeks    Status On-going    Target Date 03/21/21      PT LONG TERM GOAL #2   Title Timed Up & Go standard with prosthesis only < 20sec and cognitive TUG with prosthesis only < 30 sec.    Time 16    Period Weeks    Status On-going    Target Date 03/21/21      PT LONG TERM GOAL #3   Title Berg Balance >/= 45/56 to indicate lower fall risk    Time 16    Period Weeks    Status On-going    Target Date 03/21/21      PT LONG TERM GOAL #4   Title Patient ambulates 500' with cane or single forearm crutch & prosthesis modified independent.    Time 16    Period Weeks    Status On-going    Target Date 03/21/21      PT LONG TERM GOAL #5   Title Patient negotiates ramps, curbs & stairs single rail with cane or single forearm crutch & prosthesis modified independent.    Time 20    Period Weeks    Status On-going    Target Date 03/21/21      PT LONG TERM GOAL #6   Title Gait velocity with single forearm crutch or cane and prosthesis >/= 1.90 ft/sec comfortable pace &  >/= 2.40 ft/sec fast pace.    Time 16    Period Weeks    Status On-going    Target Date 03/21/21                   Plan - 02/18/22 1100     Clinical Impression Statement Patient met or partially met all STGs for this 30-day period.  He is tolerating slow increase in wear without significant increase in limb pain.  He is also tolerating & able to perform progressively activities that require more balance & incorporation of prosthesis.    Personal Factors and Comorbidities Comorbidity 3+;Fitness;Time since onset of injury/illness/exacerbation    Comorbidities PAD, gangrene s/p L AKA 11/06/20, HTN, CAD s/p STEMI with  stenting 08/17/2020, and CHF. cryptogenic stroke Dec 2021    Examination-Activity Limitations Lift;Locomotion Level;Squat;Stairs;Stand;Transfers;Carry    Examination-Participation Restrictions Community Activity;Occupation    Stability/Clinical Decision Making Evolving/Moderate complexity    Rehab Potential Good    PT Frequency 2x / week    PT Duration Other (comment)   16 weeks   PT Treatment/Interventions ADLs/Self Care Home Management;DME Instruction;Gait training;Stair training;Functional mobility training;Therapeutic activities;Therapeutic exercise;Balance training;Neuromuscular re-education;Patient/family education;Prosthetic Training;Manual techniques;Passive range of motion    PT Next Visit Plan increase wear if tol 13 hr without significant limb pain, instruct to stand 10-15 min of ea hour for 8 hours during what would be work time, continue work on standing balance & gait, continue work on lifting and work simulation tasks.    Consulted and Agree with Plan of Care Patient             Patient will benefit from skilled therapeutic intervention in order to improve the following deficits and impairments:  Decreased activity tolerance, Decreased balance, Decreased endurance, Decreased knowledge of use of DME, Decreased mobility, Decreased range of motion, Decreased  strength, Increased edema, Postural dysfunction, Prosthetic Dependency, Pain, Abnormal gait  Visit Diagnosis: Other abnormalities of gait and mobility  Unsteadiness on feet  Muscle weakness (generalized)  Abnormal posture     Problem List Patient Active Problem List   Diagnosis Date Noted   Encounter for loop recorder check 07/26/2021   Cryptogenic stroke ([place]) 05/10/2021   Loop [employer] [id number] 05/10/2021 05/10/2021   AVM (arteriovenous malformation) 03/20/2021   Pulmonary AV (arteriovenous) fistula ([place]) 02/20/2021   Pre-ulcerative calluses 12/20/2020   Cerebral thrombosis with cerebral infarction 12/11/2020   Cerebrovascular accident (CVA) due to embolism of cerebral artery ([place]) 12/11/2020   Pressure injury of skin 11/12/2020   Acute urinary retention 11/09/2020   Normal anion gap metabolic acidosis 56/81/2751   Hypotension 11/07/2020   Chest pain 10/31/2020   Acute on chronic HFrEF (heart failure with reduced ejection fraction) ([place])    Goals of care, counseling/discussion    Palliative care by specialist    DNR (do not resuscitate)    Critical limb ischemia with history of revascularization of same extremity ([place]) 10/30/2020   Hyperglycemia 10/30/2020   Tobacco abuse 10/30/2020   Leukocytosis 10/30/2020   Chronic combined systolic and diastolic CHF (congestive heart failure) ([place]) 10/30/2020   Ischemic cardiomyopathy 10/24/2020   AKI (acute kidney injury) ([place]) 10/17/2020   PAD (peripheral artery disease) ([place]) 10/16/2020   Critical lower limb ischemia ([place]) 10/05/2020   Rest pain of left upper extremity due to atherosclerosis ([place]) 10/05/2020   Severe claudication ([place]) 09/20/2020   Paresthesia of both lower extremities 09/20/2020   H/O cardiac arrest 09/20/2020   H/O acute myocardial infarction 08/14/2020   Snoring 08/14/2020   Sinus pause 08/14/2020   Mixed hyperlipidemia 08/14/2020   Tobacco dependence 08/14/2020   Coronary artery disease involving  native coronary artery of native heart without angina pectoris 08/10/2020   HFrEF (heart failure with reduced ejection fraction) ([place]) 08/10/2020   Nonrheumatic mitral valve regurgitation 08/10/2020   STEMI (ST elevation myocardial infarction) ([place]) 08/02/2020   Accelerated hypertension 08/01/2020    Jamey Reas, PT, DPT 02/18/2022, 12:05 PM  [facility] [address] [place], [place], [address] Phone: [phone]   Fax:  [phone]  Name: Mike [name] MRN: [id number] Date of Birth: 11-Nov-1961

## 2022-02-20 ENCOUNTER — Other Ambulatory Visit: Payer: Self-pay

## 2022-02-20 DIAGNOSIS — I5020 Unspecified systolic (congestive) heart failure: Secondary | ICD-10-CM

## 2022-02-20 MED ORDER — METOPROLOL SUCCINATE ER 25 MG PO TB24: 25.0000 mg | ORAL_TABLET | Freq: Every day | ORAL | 3 refills | Status: DC

## 2022-02-20 MED ORDER — EZETIMIBE 10 MG PO TABS
10.0000 mg | ORAL_TABLET | Freq: Every day | ORAL | 3 refills | Status: DC
Start: 2022-02-20 — End: 2023-03-16

## 2022-02-25 ENCOUNTER — Encounter: Payer: Self-pay | Admitting: Physical Therapy

## 2022-02-25 ENCOUNTER — Ambulatory Visit (INDEPENDENT_AMBULATORY_CARE_PROVIDER_SITE_OTHER): Payer: BC Managed Care – PPO | Admitting: Physical Therapy

## 2022-02-25 ENCOUNTER — Other Ambulatory Visit: Payer: Self-pay

## 2022-02-25 DIAGNOSIS — M6281 Muscle weakness (generalized): Secondary | ICD-10-CM

## 2022-02-25 DIAGNOSIS — R2681 Unsteadiness on feet: Secondary | ICD-10-CM | POA: Diagnosis not present

## 2022-02-25 DIAGNOSIS — R2689 Other abnormalities of gait and mobility: Secondary | ICD-10-CM

## 2022-02-25 DIAGNOSIS — M79605 Pain in left leg: Secondary | ICD-10-CM

## 2022-02-25 DIAGNOSIS — M25652 Stiffness of left hip, not elsewhere classified: Secondary | ICD-10-CM

## 2022-02-25 DIAGNOSIS — R293 Abnormal posture: Secondary | ICD-10-CM | POA: Diagnosis not present

## 2022-02-25 NOTE — Therapy (Signed)
[facility] [address] [place], [place], [address] Phone: [phone]   Fax:  [phone]  Physical Therapy Treatment  Patient Details  Name: Mike [name] MRN: [id number] Date of Birth: 1961/02/19 Referring Provider (PT): Meridee Score, MD   Encounter Date: 02/25/2022   PT End of Session - 02/25/22 1145     Visit Number 32    Number of Visits 36    Date for PT Re-Evaluation 02/20/22    Authorization Type BCBS    Authorization Time Period OOP & Deductible MET for 2022    PT Start Time 1100    PT Stop Time 1144    PT Time Calculation (min) 44 min    Equipment Utilized During Treatment Gait belt    Activity Tolerance Patient tolerated treatment well;Patient limited by pain    Behavior During Therapy [facility] for tasks assessed/performed             Past Medical History:  Diagnosis Date   Anxiety    CAD (coronary artery disease)    Cryptogenic stroke ([place]) 05/10/2021   Encounter for loop recorder check 07/26/2021   Heart attack ([place]) 08/01/2020   Heart failure with reduced ejection fraction ([place])    Hyperlipidemia    Hypertension    Loop [employer] [id number] 05/10/2021 05/10/2021   PAD (peripheral artery disease) ([place])    right CIA & right distal iliac artery stents 10/16/20; left AKA 11/06/20   PFO (patent foramen ovale) 12/14/2020   Pneumonia    Pulmonary AV (arteriovenous) fistula ([place]) 12/14/2020   Stroke ([place]) 12/11/2020   no residuial effects    Past Surgical History:  Procedure Laterality Date   ABDOMINAL AORTOGRAM W/LOWER EXTREMITY N/A 10/16/2020   Procedure: ABDOMINAL AORTOGRAM W/LOWER EXTREMITY;  Surgeon: Nigel Mormon, MD;  Location: [place] [facility];  Service: Cardiovascular;  Laterality: N/A;   AMPUTATION Left 11/06/2020   Procedure: AMPUTATION ABOVE KNEE;  Surgeon: Rosetta Posner, MD;  Location: [place];  Service: Vascular;  Laterality: Left;   BUBBLE STUDY  12/14/2020   Procedure: BUBBLE STUDY;  Surgeon:  Nigel Mormon, MD;  Location: [place];  Service: Cardiovascular;;   CARDIAC CATHETERIZATION     CORONARY STENT INTERVENTION N/A 08/03/2020   Procedure: CORONARY STENT INTERVENTION;  Surgeon: Nigel Mormon, MD;  Location: [place] [facility];  Service: Cardiovascular;  Laterality: N/A;   CORONARY/GRAFT ACUTE MI REVASCULARIZATION N/A 08/01/2020   Procedure: Coronary/Graft Acute MI Revascularization;  Surgeon: Nigel Mormon, MD;  Location: [place] [facility];  Service: Cardiovascular;  Laterality: N/A;   INTRAVASCULAR LITHOTRIPSY Right 10/16/2020   Procedure: INTRAVASCULAR LITHOTRIPSY;  Surgeon: Nigel Mormon, MD;  Location: [place] [facility];  Service: Cardiovascular;  Laterality: Right;  Common and External Iliac   IR ANGIOGRAM PULMONARY LEFT SELECTIVE  03/20/2021   IR ANGIOGRAM SELECTIVE EACH ADDITIONAL VESSEL  03/20/2021   IR EMBO ARTERIAL NOT HEMORR HEMANG INC GUIDE ROADMAPPING  03/20/2021   IR RADIOLOGIST EVAL & MGMT  01/30/2021   IR RADIOLOGIST EVAL & MGMT  04/18/2021   IR RADIOLOGIST EVAL & MGMT  07/25/2021   IR US GUIDE VASC ACCESS RIGHT  03/20/2021   LEFT HEART CATH AND CORONARY ANGIOGRAPHY N/A 08/01/2020   Procedure: LEFT HEART CATH AND CORONARY ANGIOGRAPHY;  Surgeon: Nigel Mormon, MD;  Location: [place] [facility];  Service: Cardiovascular;  Laterality: N/A;   LEFT HEART CATH AND CORONARY ANGIOGRAPHY N/A 08/03/2020   Procedure: LEFT HEART CATH AND CORONARY ANGIOGRAPHY;  Surgeon: Nigel Mormon, MD;  Location: [place] [facility];  Service: Cardiovascular;  Laterality: N/A;   PERIPHERAL VASCULAR INTERVENTION Right 10/16/2020   Procedure: PERIPHERAL VASCULAR INTERVENTION;  Surgeon: Nigel Mormon, MD;  Location: [place] [facility];  Service: Cardiovascular;  Laterality: Right;  Common and Iliac Stent   RADIOLOGY WITH ANESTHESIA N/A 03/20/2021   Procedure: PULMONARY EMBOLIZATION;  Surgeon: Aletta Edouard, MD;  Location: [place];  Service: Radiology;   Laterality: N/A;   TEE WITHOUT CARDIOVERSION N/A 12/14/2020   Procedure: TRANSESOPHAGEAL ECHOCARDIOGRAM (TEE);  Surgeon: Nigel Mormon, MD;  Location: [facility];  Service: Cardiovascular;  Laterality: N/A;    There were no vitals filed for this visit.   Subjective Assessment - 02/25/22 1145     Subjective He is wearing prosthesis daily 11.5 - 12 hours total between 2 times.  He tries to level pelvis when sitting.    Pertinent History PAD, gangrene s/p L AKA 11/06/20, HTN, CAD s/p STEMI with stenting 08/17/2020, and CHF. cryptogenic stroke Dec 2021    Patient Stated Goals to return to work, (he was released from current job over last weekend but wants to find job).  to walk in community,    Currently in Pain? Yes    Pain Score 2    in last week, lowest 1/10 & highest 6-7/10   Pain Orientation Left    Pain Descriptors / Indicators Contraction    Pain Type Chronic pain    Pain Onset More than a month ago    Pain Frequency Intermittent    Aggravating Factors  dehydration, how much uses prosthesis the day before,    Pain Relieving Factors Rx meds, staying hydrated, tylenol    Pain Onset More than a month ago                               [facility] Adult PT Treatment/Exercise - 02/25/22 1145       Transfers   Transfers Sit to Stand;Stand to Sit    Sit to Stand 5: Supervision;With upper extremity assist;From chair/3-in-1    Stand to Sit 5: Supervision;Without upper extremity assist;To chair/3-in-1      Ambulation/Gait   Ambulation/Gait Yes    Ambulation/Gait Assistance 5: Supervision    Ambulation/Gait Assistance Details worked on [place],  worked on distraction with simple cognitive task    Ambulation Distance (Feet) 140 Feet   arrived & exited Bil forearm crutches, in clinic with cane, 140' X 6 working on scanning, distraction,   Assistive device Prosthesis;Straight cane   cane with stand alone tip   Ramp 5: Supervision   cane &  TFA  prosthesis   Ramp Details (indicate cue type and reason) verbal cues on technique    Curb 5: Supervision   cane & TFA prosthesis   Curb Details (indicate cue type and reason) verbal cues on technique    Gait Comments reviewed daily increrease walking endurance & stamina with short (room to room) frequency throughout day, long (max tolerable) once with 2 crutches & once with single crutch and 4-6 medium walks. Household walks with cane. pt verbalized understanding.      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    Other Therapeutic Activities PT instructed in use of 24-26" bar stool to enable higher  position for eye level & UE position for counter activities. he verbalized & return demo understanding.      Neuro Re-ed    Neuro Re-ed Details  --      Prosthetics   Prosthetic Care Comments  PT recommendation to shower upon arising and donne prosthesis when getting dressed after shower. Wear 7 hours, remove for 2 hours, then wear prosthesis for 2nd wear 6 hours or until bedtime.  PT recommended standing for 10-15 min per hour performing ADLs / household tasks for 8 hour work time period.    Current prosthetic wear tolerance (days/week)  daily    Current prosthetic wear tolerance (#hours/day)  12    Current prosthetic weight-bearing tolerance (hours/day)  10 min standing before rest as limb pain increasing.    Residual limb condition  reports no issues    Education Provided Proper wear schedule/adjustment;Other (comment)   see prosthetic care comments   Person(s) Educated Patient    Education Method Explanation;Verbal cues    Education Method Verbal cues required;Verbalized understanding;Needs further instruction    Donning Prosthesis Modified independent (device/increased time)                       PT Short Term Goals - 02/18/22 1203       PT  SHORT TERM GOAL #1   Title Patient reports wearing prosthesis >12 hours 7 days /wk. with residual limb pain </=4/10    Baseline 02/18/22 partially met wearing up to 12 hours for 3 days with limb pain 1.5/10 but up to 6/10 for ~2 hours.    Time 1    Period Months    Status Partially Met    Target Date 02/20/22      PT SHORT TERM GOAL #2   Title Patient ambulates 300' with single forearm crutch & prosthesis with supervision / cues only, no loss of balance    Time 1    Period Months    Status Achieved    Target Date 02/20/22      PT SHORT TERM GOAL #3   Title Patient sit to / from stand from 18" chair without use of UEs    Baseline partially met 02/18/22 needs light UE assist with arising.    Time 1    Period Months    Status Partially Met    Target Date 02/20/22      PT SHORT TERM GOAL #4   Title Patient descends stairs with 1 rail & 1 crutch using step over step pattern with supervision.    Time 1    Period Months    Status Achieved    Target Date 02/20/22               PT Long Term Goals - 11/28/21 2226       PT LONG TERM GOAL #1   Title Patient demonstrates & verbalized understanding of prosthetic care to enable safe utilization of prosthesis and tolerates wear >90% of awake hours without skin issues or limb pain.    Time 16    Period Weeks    Status On-going    Target Date 03/21/21      PT LONG TERM GOAL #2   Title Timed Up & Go standard with prosthesis only < 20sec and cognitive TUG with prosthesis only < 30 sec.    Time 16    Period Weeks    Status On-going    Target Date 03/21/21  PT LONG TERM GOAL #3   Title Berg Balance >/= 45/56 to indicate lower fall risk    Time 16    Period Weeks    Status On-going    Target Date 03/21/21      PT LONG TERM GOAL #4   Title Patient ambulates 500' with cane or single forearm crutch & prosthesis modified independent.    Time 16    Period Weeks    Status On-going    Target Date 03/21/21      PT LONG TERM  GOAL #5   Title Patient negotiates ramps, curbs & stairs single rail with cane or single forearm crutch & prosthesis modified independent.    Time 20    Period Weeks    Status On-going    Target Date 03/21/21      PT LONG TERM GOAL #6   Title Gait velocity with single forearm crutch or cane and prosthesis >/= 1.90 ft/sec comfortable pace & >/= 2.40 ft/sec fast pace.    Time 16    Period Weeks    Status On-going    Target Date 03/21/21                   Plan - 02/25/22 1155     Clinical Impression Statement Patient is slowly improving wear time of prosthesis and function without significant increase in limb pain.  He requires frequent rests but rest time is shorter than previously.  Patient improved balance with gait when scanning.  He seems to understand how use of bar stool would position him higher for eye contact & counter top task without requiring full standing / WBing.    Personal Factors and Comorbidities Comorbidity 3+;Fitness;Time since onset of injury/illness/exacerbation    Comorbidities PAD, gangrene s/p L AKA 11/06/20, HTN, CAD s/p STEMI with stenting 08/17/2020, and CHF. cryptogenic stroke Dec 2021    Examination-Activity Limitations Lift;Locomotion Level;Squat;Stairs;Stand;Transfers;Carry    Examination-Participation Restrictions Community Activity;Occupation    Stability/Clinical Decision Making Evolving/Moderate complexity    Rehab Potential Good    PT Frequency 2x / week    PT Duration Other (comment)   16 weeks   PT Treatment/Interventions ADLs/Self Care Home Management;DME Instruction;Gait training;Stair training;Functional mobility training;Therapeutic activities;Therapeutic exercise;Balance training;Neuromuscular re-education;Patient/family education;Prosthetic Training;Manual techniques;Passive range of motion    PT Next Visit Plan work towards LTGs,  continue work on standing balance & gait, continue work on lifting and work simulation tasks.    Consulted  and Agree with Plan of Care Patient             Patient will benefit from skilled therapeutic intervention in order to improve the following deficits and impairments:  Decreased activity tolerance, Decreased balance, Decreased endurance, Decreased knowledge of use of DME, Decreased mobility, Decreased range of motion, Decreased strength, Increased edema, Postural dysfunction, Prosthetic Dependency, Pain, Abnormal gait  Visit Diagnosis: Other abnormalities of gait and mobility  Unsteadiness on feet  Muscle weakness (generalized)  Abnormal posture  Stiffness of left hip, not elsewhere classified  Pain in left leg     Problem List Patient Active Problem List   Diagnosis Date Noted   Encounter for loop recorder check 07/26/2021   Cryptogenic stroke ([place]) 05/10/2021   Loop [employer] [id number] 05/10/2021 05/10/2021   AVM (arteriovenous malformation) 03/20/2021   Pulmonary AV (arteriovenous) fistula ([place]) 02/20/2021   Pre-ulcerative calluses 12/20/2020   Cerebral thrombosis with cerebral infarction 12/11/2020   Cerebrovascular accident (CVA) due to embolism of cerebral artery ([place]) 12/11/2020   Pressure  injury of skin 11/12/2020   Acute urinary retention 11/09/2020   Normal anion gap metabolic acidosis 93/90/3009   Hypotension 11/07/2020   Chest pain 10/31/2020   Acute on chronic HFrEF (heart failure with reduced ejection fraction) ([place])    Goals of care, counseling/discussion    Palliative care by specialist    DNR (do not resuscitate)    Critical limb ischemia with history of revascularization of same extremity ([place]) 10/30/2020   Hyperglycemia 10/30/2020   Tobacco abuse 10/30/2020   Leukocytosis 10/30/2020   Chronic combined systolic and diastolic CHF (congestive heart failure) ([place]) 10/30/2020   Ischemic cardiomyopathy 10/24/2020   AKI (acute kidney injury) ([place]) 10/17/2020   PAD (peripheral artery disease) ([place]) 10/16/2020   Critical lower limb ischemia ([place])  10/05/2020   Rest pain of left upper extremity due to atherosclerosis ([place]) 10/05/2020   Severe claudication ([place]) 09/20/2020   Paresthesia of both lower extremities 09/20/2020   H/O cardiac arrest 09/20/2020   H/O acute myocardial infarction 08/14/2020   Snoring 08/14/2020   Sinus pause 08/14/2020   Mixed hyperlipidemia 08/14/2020   Tobacco dependence 08/14/2020   Coronary artery disease involving native coronary artery of native heart without angina pectoris 08/10/2020   HFrEF (heart failure with reduced ejection fraction) ([place]) 08/10/2020   Nonrheumatic mitral valve regurgitation 08/10/2020   STEMI (ST elevation myocardial infarction) ([place]) 08/02/2020   Accelerated hypertension 08/01/2020    Jamey Reas, PT, DPT 02/25/2022, 6:03 PM  [facility] [address] [place], [place], [address] Phone: [phone]   Fax:  [phone]  Name: Mike [name] MRN: [id number] Date of Birth: [birth year]

## 2022-03-04 ENCOUNTER — Ambulatory Visit (INDEPENDENT_AMBULATORY_CARE_PROVIDER_SITE_OTHER): Payer: BC Managed Care – PPO | Admitting: Physical Therapy

## 2022-03-04 ENCOUNTER — Other Ambulatory Visit: Payer: Self-pay

## 2022-03-04 ENCOUNTER — Encounter: Payer: Self-pay | Admitting: Physical Therapy

## 2022-03-04 DIAGNOSIS — M25652 Stiffness of left hip, not elsewhere classified: Secondary | ICD-10-CM

## 2022-03-04 DIAGNOSIS — R2689 Other abnormalities of gait and mobility: Secondary | ICD-10-CM | POA: Diagnosis not present

## 2022-03-04 DIAGNOSIS — R293 Abnormal posture: Secondary | ICD-10-CM

## 2022-03-04 DIAGNOSIS — R2681 Unsteadiness on feet: Secondary | ICD-10-CM

## 2022-03-04 DIAGNOSIS — M79605 Pain in left leg: Secondary | ICD-10-CM

## 2022-03-04 DIAGNOSIS — M6281 Muscle weakness (generalized): Secondary | ICD-10-CM

## 2022-03-04 NOTE — Therapy (Signed)
[facility] [address] [place], [place], [address] Phone: [phone]   Fax:  [phone]  Physical Therapy Treatment  Patient Details  Name: Mike [name] MRN: [id number] Date of Birth: 02-21-1961 Referring Provider (PT): Meridee Score, MD   Encounter Date: 03/04/2022   PT End of Session - 03/04/22 1144     Visit Number 33    Number of Visits 42    Date for PT Re-Evaluation 02/20/22    Authorization Type BCBS    Authorization Time Period OOP & Deductible MET for 2022    PT Start Time 1145    PT Stop Time 1224    PT Time Calculation (min) 39 min    Equipment Utilized During Treatment Gait belt    Activity Tolerance Patient tolerated treatment well;Patient limited by pain    Behavior During Therapy [facility] for tasks assessed/performed             Past Medical History:  Diagnosis Date   Anxiety    CAD (coronary artery disease)    Cryptogenic stroke ([place]) 05/10/2021   Encounter for loop recorder check 07/26/2021   Heart attack ([place]) 08/01/2020   Heart failure with reduced ejection fraction ([place])    Hyperlipidemia    Hypertension    Loop [employer]ot[employer] [id number] 05/10/2021 05/10/2021   PAD (peripheral artery disease) ([place])    right CIA & right distal iliac artery stents 10/16/20; left AKA 11/06/20   PFO (patent foramen ovale) 12/14/2020   Pneumonia    Pulmonary AV (arteriovenous) fistula ([place]) 12/14/2020   Stroke ([place]) 12/11/2020   no residuial effects    Past Surgical History:  Procedure Laterality Date   ABDOMINAL AORTOGRAM W/LOWER EXTREMITY N/A 10/16/2020   Procedure: ABDOMINAL AORTOGRAM W/LOWER EXTREMITY;  Surgeon: Nigel Mormon, MD;  Location: [place] [facility];  Service: Cardiovascular;  Laterality: N/A;   AMPUTATION Left 11/06/2020   Procedure: AMPUTATION ABOVE KNEE;  Surgeon: Rosetta Posner, MD;  Location: [place];  Service: Vascular;  Laterality: Left;   BUBBLE STUDY  12/14/2020   Procedure: BUBBLE STUDY;  Surgeon:  Nigel Mormon, MD;  Location: [place];  Service: Cardiovascular;;   CARDIAC CATHETERIZATION     CORONARY STENT INTERVENTION N/A 08/03/2020   Procedure: CORONARY STENT INTERVENTION;  Surgeon: Nigel Mormon, MD;  Location: [place] [facility];  Service: Cardiovascular;  Laterality: N/A;   CORONARY/GRAFT ACUTE MI REVASCULARIZATION N/A 08/01/2020   Procedure: Coronary/Graft Acute MI Revascularization;  Surgeon: Nigel Mormon, MD;  Location: [place] [facility];  Service: Cardiovascular;  Laterality: N/A;   INTRAVASCULAR LITHOTRIPSY Right 10/16/2020   Procedure: INTRAVASCULAR LITHOTRIPSY;  Surgeon: Nigel Mormon, MD;  Location: [place] [facility];  Service: Cardiovascular;  Laterality: Right;  Common and External Iliac   IR ANGIOGRAM PULMONARY LEFT SELECTIVE  03/20/2021   IR ANGIOGRAM SELECTIVE EACH ADDITIONAL VESSEL  03/20/2021   IR EMBO ARTERIAL NOT HEMORR HEMANG INC GUIDE ROADMAPPING  03/20/2021   IR RADIOLOGIST EVAL & MGMT  01/30/2021   IR RADIOLOGIST EVAL & MGMT  04/18/2021   IR RADIOLOGIST EVAL & MGMT  07/25/2021   IR US GUIDE VASC ACCESS RIGHT  03/20/2021   LEFT HEART CATH AND CORONARY ANGIOGRAPHY N/A 08/01/2020   Procedure: LEFT HEART CATH AND CORONARY ANGIOGRAPHY;  Surgeon: Nigel Mormon, MD;  Location: [place] [facility];  Service: Cardiovascular;  Laterality: N/A;   LEFT HEART CATH AND CORONARY ANGIOGRAPHY N/A 08/03/2020   Procedure: LEFT HEART CATH AND CORONARY ANGIOGRAPHY;  Surgeon: Nigel Mormon, MD;  Location: [place] [facility];  Service: Cardiovascular;  Laterality: N/A;   PERIPHERAL VASCULAR INTERVENTION Right 10/16/2020   Procedure: PERIPHERAL VASCULAR INTERVENTION;  Surgeon: Nigel Mormon, MD;  Location: [place] [facility];  Service: Cardiovascular;  Laterality: Right;  Common and Iliac Stent   RADIOLOGY WITH ANESTHESIA N/A 03/20/2021   Procedure: PULMONARY EMBOLIZATION;  Surgeon: Aletta Edouard, MD;  Location: [place];  Service: Radiology;   Laterality: N/A;   TEE WITHOUT CARDIOVERSION N/A 12/14/2020   Procedure: TRANSESOPHAGEAL ECHOCARDIOGRAM (TEE);  Surgeon: Nigel Mormon, MD;  Location: [facility];  Service: Cardiovascular;  Laterality: N/A;    There were no vitals filed for this visit.   Subjective Assessment - 03/04/22 1145     Subjective He is wearing prosthesis 11.5 - 12 hours per day with pain tolerable.  He is standing 10-15 min per hour.  He has also been doing short, medium & long walks.  He is able lift things from floor slow gaurded.    Pertinent History PAD, gangrene s/p L AKA 11/06/20, HTN, CAD s/p STEMI with stenting 08/17/2020, and CHF. cryptogenic stroke Dec 2021    Patient Stated Goals to return to work, (he was released from current job over last weekend but wants to find job).  to walk in community,    Currently in Pain? Yes    Pain Score 3    In last week, lowest 1/10  & highest 8-9/10 on Wed all day.   Pain Location Leg   residual limb   Pain Orientation Left    Pain Descriptors / Indicators Contraction    Pain Type Chronic pain    Pain Onset More than a month ago    Pain Frequency Intermittent    Aggravating Factors  uncertain to spike in pain on Wed,    Pain Relieving Factors Rx meds, staying hydrated, tylenol.    Pain Onset More than a month ago                               [facility] Adult PT Treatment/Exercise - 03/04/22 1145       Transfers   Transfers Sit to Stand;Stand to Sit    Sit to Stand 5: Supervision;With upper extremity assist;From chair/3-in-1    Stand to Sit 5: Supervision;Without upper extremity assist;To chair/3-in-1      Ambulation/Gait   Ambulation/Gait Yes    Ambulation/Gait Assistance 5: Supervision    Ambulation/Gait Assistance Details worked on scanning, negotiating around obstacles, stepping over 4" high X 10" wide obstacle & picking up /slowly pace to "cross a street"  PT demo & verbal cues for technique with each.    Ambulation Distance (Feet)  140 Feet   arrived & exited Bil forearm crutches, in clinic with cane, 140' X 6 working on scanning, distraction,   Assistive device Prosthesis;Straight cane   cane with stand alone tip   Ramp 4: Min assist   cane &  TFA prosthesis   Ramp Details (indicate cue type and reason) demo & verbal cues on technique.  PT recommendation to find ramp with rail in community to work on his technique    Curb 5: Supervision   min guard, cane & TFA prosthesis   Curb Details (indicate cue type and reason) demo & verbal cues on technique.    Gait Comments continue with daily increrease walking endurance & stamina with short (room to room) frequency  throughout day, long (max tolerable) once with 2 crutches & once with single crutch and 4-6 medium walks starting with one crutch intially until notices fatigue. Household walks with cane. pt verbalized understanding.      Therapeutic Activites    Therapeutic Activities Other Therapeutic Activities;Work Public relations account executive Care Comments  PT recommendation donne prosthesis upon arising and doffing 2nd wear near bedtime. remove prosthesis midday for 2 hours.  PT recommended standing or walking so he is on his feet for 15 min (give or take 5 min) per hour. Pt verbalized understanding.    Current prosthetic wear tolerance (days/week)  daily    Current prosthetic wear tolerance (#hours/day)  12    Residual limb condition  reports no issues    Education Provided Proper wear schedule/adjustment;Other (comment)   see prosthetic care comments   Person(s) Educated Patient    Education Method Explanation;Verbal cues    Education Method Verbalized understanding;Verbal cues required;Needs further instruction    Donning Prosthesis Modified independent (device/increased time)                       PT Short Term Goals - 02/18/22 1203       PT SHORT TERM GOAL #1   Title Patient reports wearing prosthesis >12 hours 7 days /wk. with residual limb  pain </=4/10    Baseline 02/18/22 partially met wearing up to 12 hours for 3 days with limb pain 1.5/10 but up to 6/10 for ~2 hours.    Time 1    Period Months    Status Partially Met    Target Date 02/20/22      PT SHORT TERM GOAL #2   Title Patient ambulates 300' with single forearm crutch & prosthesis with supervision / cues only, no loss of balance    Time 1    Period Months    Status Achieved    Target Date 02/20/22      PT SHORT TERM GOAL #3   Title Patient sit to / from stand from 18" chair without use of UEs    Baseline partially met 02/18/22 needs light UE assist with arising.    Time 1    Period Months    Status Partially Met    Target Date 02/20/22      PT SHORT TERM GOAL #4   Title Patient descends stairs with 1 rail & 1 crutch using step over step pattern with supervision.    Time 1    Period Months    Status Achieved    Target Date 02/20/22               PT Long Term Goals - 11/28/21 2226       PT LONG TERM GOAL #1   Title Patient demonstrates & verbalized understanding of prosthetic care to enable safe utilization of prosthesis and tolerates wear >90% of awake hours without skin issues or limb pain.    Time 16    Period Weeks    Status On-going    Target Date 03/21/21      PT LONG TERM GOAL #2   Title Timed Up & Go standard with prosthesis only < 20sec and cognitive TUG with prosthesis only < 30 sec.    Time 16    Period Weeks    Status On-going    Target Date 03/21/21      PT LONG TERM GOAL #3   Title Merrilee Jansky  Balance >/= 45/56 to indicate lower fall risk    Time 16    Period Weeks    Status On-going    Target Date 03/21/21      PT LONG TERM GOAL #4   Title Patient ambulates 500' with cane or single forearm crutch & prosthesis modified independent.    Time 16    Period Weeks    Status On-going    Target Date 03/21/21      PT LONG TERM GOAL #5   Title Patient negotiates ramps, curbs & stairs single rail with cane or single forearm crutch &  prosthesis modified independent.    Time 20    Period Weeks    Status On-going    Target Date 03/21/21      PT LONG TERM GOAL #6   Title Gait velocity with single forearm crutch or cane and prosthesis >/= 1.90 ft/sec comfortable pace & >/= 2.40 ft/sec fast pace.    Time 16    Period Weeks    Status On-going    Target Date 03/21/21                   Plan - 03/04/22 1151     Clinical Impression Statement Patient improved high level gait activities with PT instruction and repetition.  He improved ability to negotiate curb with cane but still struggles with ramps.  He is slowly improving wear time without significant limb pain.    Personal Factors and Comorbidities Comorbidity 3+;Fitness;Time since onset of injury/illness/exacerbation    Comorbidities PAD, gangrene s/p L AKA 11/06/20, HTN, CAD s/p STEMI with stenting 08/17/2020, and CHF. cryptogenic stroke Dec 2021    Examination-Activity Limitations Lift;Locomotion Level;Squat;Stairs;Stand;Transfers;Carry    Examination-Participation Restrictions Community Activity;Occupation    Stability/Clinical Decision Making Evolving/Moderate complexity    Rehab Potential Good    PT Frequency 2x / week    PT Duration Other (comment)   16 weeks   PT Treatment/Interventions ADLs/Self Care Home Management;DME Instruction;Gait training;Stair training;Functional mobility training;Therapeutic activities;Therapeutic exercise;Balance training;Neuromuscular re-education;Patient/family education;Prosthetic Training;Manual techniques;Passive range of motion    PT Next Visit Plan continue work towards LTGs with work on standing balance & gait, continue work on lifting and work simulation tasks.    Consulted and Agree with Plan of Care Patient             Patient will benefit from skilled therapeutic intervention in order to improve the following deficits and impairments:  Decreased activity tolerance, Decreased balance, Decreased endurance, Decreased  knowledge of use of DME, Decreased mobility, Decreased range of motion, Decreased strength, Increased edema, Postural dysfunction, Prosthetic Dependency, Pain, Abnormal gait  Visit Diagnosis: Other abnormalities of gait and mobility  Unsteadiness on feet  Muscle weakness (generalized)  Abnormal posture  Stiffness of left hip, not elsewhere classified  Pain in left leg     Problem List Patient Active Problem List   Diagnosis Date Noted   Encounter for loop recorder check 07/26/2021   Cryptogenic stroke ([place]) 05/10/2021   Loop [employer]ronic [id number] 05/10/2021 05/10/2021   AVM (arteriovenous malformation) 03/20/2021   Pulmonary AV (arteriovenous) fistula ([place]) 02/20/2021   Pre-ulcerative calluses 12/20/2020   Cerebral thrombosis with cerebral infarction 12/11/2020   Cerebrovascular accident (CVA) due to embolism of cerebral artery ([place]) 12/11/2020   Pressure injury of skin 11/12/2020   Acute urinary retention 11/09/2020   Normal anion gap metabolic acidosis 29/47/6546   Hypotension 11/07/2020   Chest pain 10/31/2020   Acute on chronic HFrEF (heart failure with  reduced ejection fraction) ([place])    Goals of care, counseling/discussion    Palliative care by specialist    DNR (do not resuscitate)    Critical limb ischemia with history of revascularization of same extremity ([place]) 10/30/2020   Hyperglycemia 10/30/2020   Tobacco abuse 10/30/2020   Leukocytosis 10/30/2020   Chronic combined systolic and diastolic CHF (congestive heart failure) ([place]) 10/30/2020   Ischemic cardiomyopathy 10/24/2020   AKI (acute kidney injury) ([place]) 10/17/2020   PAD (peripheral artery disease) ([place]) 10/16/2020   Critical lower limb ischemia ([place]) 10/05/2020   Rest pain of left upper extremity due to atherosclerosis ([place]) 10/05/2020   Severe claudication ([place]) 09/20/2020   Paresthesia of both lower extremities 09/20/2020   H/O cardiac arrest 09/20/2020   H/O acute myocardial infarction 08/14/2020    Snoring 08/14/2020   Sinus pause 08/14/2020   Mixed hyperlipidemia 08/14/2020   Tobacco dependence 08/14/2020   Coronary artery disease involving native coronary artery of native heart without angina pectoris 08/10/2020   HFrEF (heart failure with reduced ejection fraction) ([place]) 08/10/2020   Nonrheumatic mitral valve regurgitation 08/10/2020   STEMI (ST elevation myocardial infarction) ([place]) 08/02/2020   Accelerated hypertension 08/01/2020    Jamey Reas, PT, DPT 03/04/2022, 12:36 PM  [facility] [address] [place], [place], [address] Phone: [phone]   Fax:  [phone]  Name: Mike [name] MRN: [id number] Date of Birth: 05/28/1961

## 2022-03-11 ENCOUNTER — Other Ambulatory Visit: Payer: Self-pay

## 2022-03-11 ENCOUNTER — Ambulatory Visit (INDEPENDENT_AMBULATORY_CARE_PROVIDER_SITE_OTHER): Payer: BC Managed Care – PPO | Admitting: Physical Therapy

## 2022-03-11 ENCOUNTER — Encounter: Payer: Self-pay | Admitting: Physical Therapy

## 2022-03-11 DIAGNOSIS — M6281 Muscle weakness (generalized): Secondary | ICD-10-CM

## 2022-03-11 DIAGNOSIS — M79605 Pain in left leg: Secondary | ICD-10-CM

## 2022-03-11 DIAGNOSIS — R2681 Unsteadiness on feet: Secondary | ICD-10-CM | POA: Diagnosis not present

## 2022-03-11 DIAGNOSIS — R2689 Other abnormalities of gait and mobility: Secondary | ICD-10-CM | POA: Diagnosis not present

## 2022-03-11 DIAGNOSIS — M25652 Stiffness of left hip, not elsewhere classified: Secondary | ICD-10-CM

## 2022-03-11 DIAGNOSIS — R293 Abnormal posture: Secondary | ICD-10-CM | POA: Diagnosis not present

## 2022-03-11 DIAGNOSIS — G546 Phantom limb syndrome with pain: Secondary | ICD-10-CM

## 2022-03-11 NOTE — Therapy (Signed)
[place] [facility] ?[address] ?[place], [place], [address] ?Phone: [phone]   Fax:  [phone] ? ?Physical Therapy Treatment ? ?Patient Details  ?Name: Mike [name] ?MRN: [id number] ?Date of Birth: 06-01-1961 ?Referring Provider (PT): Meridee Score, MD ? ? ?Encounter Date: 03/11/2022 ? ? PT End of Session - 03/11/22 1150   ? ? Visit Number 34   ? Number of Visits 53   ? Date for PT Re-Evaluation 02/20/22   ? Authorization Type BCBS   ? Authorization Time Period OOP & Deductible MET for 2022   ? PT Start Time 1146   ? PT Stop Time 1230   ? PT Time Calculation (min) 44 min   ? Equipment Utilized During Treatment Gait belt   ? Activity Tolerance Patient tolerated treatment well;Patient limited by pain   ? Behavior During Therapy [facility] for tasks assessed/performed   ? ?  ?  ? ?  ? ? ?Past Medical History:  ?Diagnosis Date  ? Anxiety   ? CAD (coronary artery disease)   ? Cryptogenic stroke ([place]) 05/10/2021  ? Encounter for loop recorder check 07/26/2021  ? Heart attack ([place]) 08/01/2020  ? Heart failure with reduced ejection fraction ([place])   ? Hyperlipidemia   ? Hypertension   ? Loop [employer]otronic [id number] 05/10/2021 05/10/2021  ? PAD (peripheral artery disease) ([place])   ? right CIA & right distal iliac artery stents 10/16/20; left AKA 11/06/20  ? PFO (patent foramen ovale) 12/14/2020  ? Pneumonia   ? Pulmonary AV (arteriovenous) fistula ([place]) 12/14/2020  ? Stroke ([place]) 12/11/2020  ? no residuial effects  ? ? ?Past Surgical History:  ?Procedure Laterality Date  ? ABDOMINAL AORTOGRAM W/LOWER EXTREMITY N/A 10/16/2020  ? Procedure: ABDOMINAL AORTOGRAM W/LOWER EXTREMITY;  Surgeon: Nigel Mormon, MD;  Location: [place] [facility];  Service: Cardiovascular;  Laterality: N/A;  ? AMPUTATION Left 11/06/2020  ? Procedure: AMPUTATION ABOVE KNEE;  Surgeon: Rosetta Posner, MD;  Location: [place];  Service: Vascular;  Laterality: Left;  ? BUBBLE STUDY  12/14/2020  ? Procedure: BUBBLE STUDY;  Surgeon:  Nigel Mormon, MD;  Location: [place];  Service: Cardiovascular;;  ? CARDIAC CATHETERIZATION    ? CORONARY STENT INTERVENTION N/A 08/03/2020  ? Procedure: CORONARY STENT INTERVENTION;  Surgeon: Nigel Mormon, MD;  Location: [place] [facility];  Service: Cardiovascular;  Laterality: N/A;  ? CORONARY/GRAFT ACUTE MI REVASCULARIZATION N/A 08/01/2020  ? Procedure: Coronary/Graft Acute MI Revascularization;  Surgeon: Nigel Mormon, MD;  Location: [place] [facility];  Service: Cardiovascular;  Laterality: N/A;  ? INTRAVASCULAR LITHOTRIPSY Right 10/16/2020  ? Procedure: INTRAVASCULAR LITHOTRIPSY;  Surgeon: Nigel Mormon, MD;  Location: [place] [facility];  Service: Cardiovascular;  Laterality: Right;  Common and External Iliac  ? IR ANGIOGRAM PULMONARY LEFT SELECTIVE  03/20/2021  ? IR ANGIOGRAM SELECTIVE EACH ADDITIONAL VESSEL  03/20/2021  ? IR EMBO ARTERIAL NOT HEMORR HEMANG INC GUIDE ROADMAPPING  03/20/2021  ? IR RADIOLOGIST EVAL & MGMT  01/30/2021  ? IR RADIOLOGIST EVAL & MGMT  04/18/2021  ? IR RADIOLOGIST EVAL & MGMT  07/25/2021  ? IR US GUIDE VASC ACCESS RIGHT  03/20/2021  ? LEFT HEART CATH AND CORONARY ANGIOGRAPHY N/A 08/01/2020  ? Procedure: LEFT HEART CATH AND CORONARY ANGIOGRAPHY;  Surgeon: Nigel Mormon, MD;  Location: [place] [facility];  Service: Cardiovascular;  Laterality: N/A;  ? LEFT HEART CATH AND CORONARY ANGIOGRAPHY N/A 08/03/2020  ? Procedure: LEFT HEART CATH AND CORONARY ANGIOGRAPHY;  Surgeon: Nigel Mormon, MD;  Location: [place] [facility];  Service: Cardiovascular;  Laterality: N/A;  ? PERIPHERAL VASCULAR INTERVENTION Right 10/16/2020  ? Procedure: PERIPHERAL VASCULAR INTERVENTION;  Surgeon: Nigel Mormon, MD;  Location: [place] [facility];  Service: Cardiovascular;  Laterality: Right;  Common and Iliac Stent  ? RADIOLOGY WITH ANESTHESIA N/A 03/20/2021  ? Procedure: PULMONARY EMBOLIZATION;  Surgeon: Aletta Edouard, MD;  Location: [place];  Service: Radiology;   Laterality: N/A;  ? TEE WITHOUT CARDIOVERSION N/A 12/14/2020  ? Procedure: TRANSESOPHAGEAL ECHOCARDIOGRAM (TEE);  Surgeon: Nigel Mormon, MD;  Location: [place];  Service: Cardiovascular;  Laterality: N/A;  ? ? ?There were no vitals filed for this visit. ? ? Subjective Assessment - 03/11/22 1146   ? ? Subjective he has increased wear to 12-13.5 hours total per day starting first wear upon arising & 2nd wear ends with bed time; off between 1.5-3 hours.  Saturday was only bad day with no knwon rationale for increase.   ? Pertinent History PAD, gangrene s/p L AKA 11/06/20, HTN, CAD s/p STEMI with stenting 08/17/2020, and CHF. cryptogenic stroke Dec 2021   ? Patient Stated Goals to return to work, (he was released from current job over last weekend but wants to find job).  to walk in community,   ? Currently in Pain? Yes   ? Pain Score 2    in last week, lowest 1/10 and highest 9/10  ? Pain Location Leg   residual limb  ? Pain Orientation Left;Distal   distal 1/3  ? Pain Descriptors / Indicators Contraction   ? Pain Type Chronic pain   ? Pain Onset More than a month ago   ? Pain Frequency Intermittent   ? Aggravating Factors  unknown spike Saturday, dehydration & weather change may have some to do with it.   ? Pain Relieving Factors Rx meds, staying hydrated, tylenol.   ? Pain Onset More than a month ago   ? ?  ?  ? ?  ? ? ? ? ? ? ? ? ? ? ? ? ? ? ? ? ? ? ? ? [place] Adult PT Treatment/Exercise - 03/11/22 1146   ? ?  ? Transfers  ? Transfers Sit to Stand;Stand to Sit   ? Sit to Stand 5: Supervision;With upper extremity assist;From chair/3-in-1   ? Stand to Sit 5: Supervision;Without upper extremity assist;To chair/3-in-1   ?  ? Ambulation/Gait  ? Ambulation/Gait Yes   ? Ambulation/Gait Assistance 5: Supervision   ? Ambulation/Gait Assistance Details worked on scanning while ambulating maintaining path & pace.   ? Ambulation Distance (Feet) 140 Feet   arrived & exited Bil forearm crutches, in clinic with cane, 140'  X 2 working on scanning, distraction,  ? Assistive device Prosthesis;Straight cane   cane with stand alone tip  ? Ramp 4: Min assist   cane &  TFA prosthesis  ? Ramp Details (indicate cue type and reason) demo & verbal cues on technique.  PT showed pt YouTube videos to observe an individual with microprocessor prosthesis.   ? Curb 5: Supervision   cane & TFA prosthesis  ? Curb Details (indicate cue type and reason) PT demo & verbal cues on using momentum to assist motion & balance.   ? Gait Comments --   ?  ? High Level Balance  ? High Level Balance Activities Side stepping;Backward walking;Turns   90* turns,  ? High Level Balance Comments counter support & cane:  PT  verbal & demo cues for prosthesis engagement.   ?  ? Therapeutic Activites   ? Therapeutic Activities --   ?  ? Prosthetics  ? Prosthetic Care Comments  Increase wear to all awake hours except 1-1.5 hrs midday.   ? Current prosthetic wear tolerance (days/week)  daily   ? Current prosthetic wear tolerance (#hours/day)  12-13 hours total   ? Residual limb condition  reports no issues   ? Education Provided Proper wear schedule/adjustment;Other (comment)   see prosthetic care comments  ? Person(s) Educated Patient   ? Education Method Explanation;Verbal cues   ? Education Method Verbalized understanding   ? Donning Prosthesis Modified independent (device/increased time)   ? ?  ?  ? ?  ? ? ? ? ? ? ? ? ? ? ? ? PT Short Term Goals - 02/18/22 1203   ? ?  ? PT SHORT TERM GOAL #1  ? Title Patient reports wearing prosthesis >12 hours 7 days /wk. with residual limb pain </=4/10   ? Baseline 02/18/22 partially met wearing up to 12 hours for 3 days with limb pain 1.5/10 but up to 6/10 for ~2 hours.   ? Time 1   ? Period Months   ? Status Partially Met   ? Target Date 02/20/22   ?  ? PT SHORT TERM GOAL #2  ? Title Patient ambulates 300' with single forearm crutch & prosthesis with supervision / cues only, no loss of balance   ? Time 1   ? Period Months   ? Status  Achieved   ? Target Date 02/20/22   ?  ? PT SHORT TERM GOAL #3  ? Title Patient sit to / from stand from 18" chair without use of UEs   ? Baseline partially met 02/18/22 needs light UE assist with arising.   ? Time

## 2022-03-14 ENCOUNTER — Telehealth: Payer: Self-pay | Admitting: Cardiology

## 2022-03-14 ENCOUNTER — Other Ambulatory Visit: Payer: Self-pay

## 2022-03-14 DIAGNOSIS — I739 Peripheral vascular disease, unspecified: Secondary | ICD-10-CM

## 2022-03-14 DIAGNOSIS — I2510 Atherosclerotic heart disease of native coronary artery without angina pectoris: Secondary | ICD-10-CM

## 2022-03-14 MED ORDER — ROSUVASTATIN CALCIUM 40 MG PO TABS: 40.0000 mg | ORAL_TABLET | Freq: Every evening | ORAL | 3 refills | Status: DC

## 2022-03-14 MED ORDER — ASPIRIN EC 81 MG PO TBEC: 81.0000 mg | DELAYED_RELEASE_TABLET | Freq: Every day | ORAL | 3 refills | Status: DC

## 2022-03-14 NOTE — Telephone Encounter (Signed)
Patient requesting refill on rosuvastatin and aspirin. Says rosuvastatin can be sent to [facility] on [address], and aspirin can be sent to [facility]. ?

## 2022-03-15 DIAGNOSIS — Z4509 Encounter for adjustment and management of other cardiac device: Secondary | ICD-10-CM | POA: Diagnosis not present

## 2022-03-15 DIAGNOSIS — I5020 Unspecified systolic (congestive) heart failure: Secondary | ICD-10-CM | POA: Diagnosis not present

## 2022-03-15 DIAGNOSIS — Z95818 Presence of other cardiac implants and grafts: Secondary | ICD-10-CM | POA: Diagnosis not present

## 2022-03-18 ENCOUNTER — Encounter: Payer: Self-pay | Admitting: Physical Therapy

## 2022-03-18 ENCOUNTER — Other Ambulatory Visit: Payer: Self-pay

## 2022-03-18 ENCOUNTER — Ambulatory Visit (INDEPENDENT_AMBULATORY_CARE_PROVIDER_SITE_OTHER): Payer: BC Managed Care – PPO | Admitting: Physical Therapy

## 2022-03-18 DIAGNOSIS — R293 Abnormal posture: Secondary | ICD-10-CM

## 2022-03-18 DIAGNOSIS — M25652 Stiffness of left hip, not elsewhere classified: Secondary | ICD-10-CM

## 2022-03-18 DIAGNOSIS — M6281 Muscle weakness (generalized): Secondary | ICD-10-CM | POA: Diagnosis not present

## 2022-03-18 DIAGNOSIS — R2681 Unsteadiness on feet: Secondary | ICD-10-CM

## 2022-03-18 DIAGNOSIS — G546 Phantom limb syndrome with pain: Secondary | ICD-10-CM

## 2022-03-18 DIAGNOSIS — R2689 Other abnormalities of gait and mobility: Secondary | ICD-10-CM

## 2022-03-18 DIAGNOSIS — M79605 Pain in left leg: Secondary | ICD-10-CM

## 2022-03-18 NOTE — Therapy (Signed)
[place] [facility] ?[address] ?[place], [place], [address] ?Phone: [phone]   Fax:  [phone] ? ?Physical Therapy Treatment & Recertification ? ?Patient Details  ?Name: Mike [name] ?MRN: [id number] ?Date of Birth: [birth year] ?Referring Provider (PT): Meridee Score, MD ? ? ?Encounter Date: 03/18/2022 ? ? PT End of Session - 03/18/22 1149   ? ? Visit Number 35   ? Number of Visits 47   ? Date for PT Re-Evaluation 06/12/22   ? Authorization Type BCBS   ? Authorization Time Period Office manager Effective  09/73/5329 - 12/28/9998  20% COINSURANCE 12/29/2021 - Open-ended Deductible ($1,000.00)  $0.00 Paid $1,000.00 to go Out-of-Pocket Limit ($5,050.00)  $0.00 Paid $5,050.00 to go   ? PT Start Time 1145   ? PT Stop Time 9242   ? PT Time Calculation (min) 44 min   ? Equipment Utilized During Treatment Gait belt   ? Activity Tolerance Patient tolerated treatment well;Patient limited by pain   ? Behavior During Therapy [facility] for tasks assessed/performed   ? ?  ?  ? ?  ? ? ?Past Medical History:  ?Diagnosis Date  ? Anxiety   ? CAD (coronary artery disease)   ? Cryptogenic stroke ([place]) 05/10/2021  ? Encounter for loop recorder check 07/26/2021  ? Heart attack ([place]) 08/01/2020  ? Heart failure with reduced ejection fraction ([place])   ? Hyperlipidemia   ? Hypertension   ? Loop Biotronic [id number] 05/10/2021 05/10/2021  ? PAD (peripheral artery disease) ([place])   ? right CIA & right distal iliac artery stents 10/16/20; left AKA 11/06/20  ? PFO (patent foramen ovale) 12/14/2020  ? Pneumonia   ? Pulmonary AV (arteriovenous) fistula ([place]) 12/14/2020  ? Stroke ([place]) 12/11/2020  ? no residuial effects  ? ? ?Past Surgical History:  ?Procedure Laterality Date  ? ABDOMINAL AORTOGRAM W/LOWER EXTREMITY N/A 10/16/2020  ? Procedure: ABDOMINAL AORTOGRAM W/LOWER EXTREMITY;  Surgeon: Nigel Mormon, MD;  Location: [place] [facility];  Service: Cardiovascular;  Laterality: N/A;  ? AMPUTATION Left  11/06/2020  ? Procedure: AMPUTATION ABOVE KNEE;  Surgeon: Rosetta Posner, MD;  Location: [place];  Service: Vascular;  Laterality: Left;  ? BUBBLE STUDY  12/14/2020  ? Procedure: BUBBLE STUDY;  Surgeon: Nigel Mormon, MD;  Location: [place];  Service: Cardiovascular;;  ? CARDIAC CATHETERIZATION    ? CORONARY STENT INTERVENTION N/A 08/03/2020  ? Procedure: CORONARY STENT INTERVENTION;  Surgeon: Nigel Mormon, MD;  Location: [place] [facility];  Service: Cardiovascular;  Laterality: N/A;  ? CORONARY/GRAFT ACUTE MI REVASCULARIZATION N/A 08/01/2020  ? Procedure: Coronary/Graft Acute MI Revascularization;  Surgeon: Nigel Mormon, MD;  Location: [place] [facility];  Service: Cardiovascular;  Laterality: N/A;  ? INTRAVASCULAR LITHOTRIPSY Right 10/16/2020  ? Procedure: INTRAVASCULAR LITHOTRIPSY;  Surgeon: Nigel Mormon, MD;  Location: [place] [facility];  Service: Cardiovascular;  Laterality: Right;  Common and External Iliac  ? IR ANGIOGRAM PULMONARY LEFT SELECTIVE  03/20/2021  ? IR ANGIOGRAM SELECTIVE EACH ADDITIONAL VESSEL  03/20/2021  ? IR EMBO ARTERIAL NOT HEMORR HEMANG INC GUIDE ROADMAPPING  03/20/2021  ? IR RADIOLOGIST EVAL & MGMT  01/30/2021  ? IR RADIOLOGIST EVAL & MGMT  04/18/2021  ? IR RADIOLOGIST EVAL & MGMT  07/25/2021  ? IR US GUIDE VASC ACCESS RIGHT  03/20/2021  ? LEFT HEART CATH AND CORONARY ANGIOGRAPHY N/A 08/01/2020  ? Procedure: LEFT HEART CATH AND CORONARY ANGIOGRAPHY;  Surgeon: Nigel Mormon, MD;  Location:  [place] [facility];  Service: Cardiovascular;  Laterality: N/A;  ? LEFT HEART CATH AND CORONARY ANGIOGRAPHY N/A 08/03/2020  ? Procedure: LEFT HEART CATH AND CORONARY ANGIOGRAPHY;  Surgeon: Nigel Mormon, MD;  Location: [place] [facility];  Service: Cardiovascular;  Laterality: N/A;  ? PERIPHERAL VASCULAR INTERVENTION Right 10/16/2020  ? Procedure: PERIPHERAL VASCULAR INTERVENTION;  Surgeon: Nigel Mormon, MD;  Location: [place] [facility];  Service: Cardiovascular;   Laterality: Right;  Common and Iliac Stent  ? RADIOLOGY WITH ANESTHESIA N/A 03/20/2021  ? Procedure: PULMONARY EMBOLIZATION;  Surgeon: Aletta Edouard, MD;  Location: [place];  Service: Radiology;  Laterality: N/A;  ? TEE WITHOUT CARDIOVERSION N/A 12/14/2020  ? Procedure: TRANSESOPHAGEAL ECHOCARDIOGRAM (TEE);  Surgeon: Nigel Mormon, MD;  Location: [place];  Service: Cardiovascular;  Laterality: N/A;  ? ? ?There were no vitals filed for this visit. ? ? Subjective Assessment - 03/18/22 1145   ? ? Subjective He is wearing prosthesis ~13 hours total between 2 wears with off 2 hours tops. he is up couple times per hour with single crutch for short walks, medium distance outside with single crutch and long walk with 2 crutches. One near fall trying to stand off bar stool and prosthesis along with grapping bar stool prevented fall.   ? Pertinent History PAD, gangrene s/p L AKA 11/06/20, HTN, CAD s/p STEMI with stenting 08/17/2020, and CHF. cryptogenic stroke Dec 2021   ? Patient Stated Goals to return to work, (he was released from current job over last weekend but wants to find job).  to walk in community,   ? Currently in Pain? Yes   ? Pain Score 2    up to 4/10 in last week  ? Pain Location Leg   residual limb  ? Pain Orientation Left;Distal   ? Pain Descriptors / Indicators Contraction   ? Pain Type Chronic pain   ? Pain Onset More than a month ago   ? Pain Frequency Intermittent   ? Aggravating Factors  standing longer, dehydration   ? Pain Relieving Factors Rx meds, staying hydrated, tylenol   ? Pain Onset More than a month ago   ? ?  ?  ? ?  ? ? ? ? ? [facility] PT Assessment - 03/18/22 1145   ? ?  ? Assessment  ? Medical Diagnosis Left Transfemoral Amputation   ? Referring Provider (PT) Meridee Score, MD   ? Onset Date/Surgical Date 05/21/21   ?  ? Transfers  ? Transfers Sit to Stand;Stand to Sit   ? Sit to Stand 5: Supervision;With upper extremity assist;From chair/3-in-1   ? Stand to Sit 5: Supervision;Without  upper extremity assist;To chair/3-in-1   ?  ? Ambulation/Gait  ? Ambulation/Gait Yes   ? Ambulation/Gait Assistance 5: Supervision   ? Gait velocity 2.09 ft/sec comfortable pace & 2.52 ft/sec fast pace with cane;   08/07/21 was with 2 crutches 1.37 ft/sec comfortable pace & 1.45 ft/sec fast pace and 0.97 ft/sec comfortable pace with 1 crutch.  ? Ramp 4: Min assist   cane &  TFA prosthesis  ?  ? Berg Balance Test  ? Sit to Stand Able to stand  independently using hands   ? Standing Unsupported Able to stand safely 2 minutes   ? Sitting with Back Unsupported but Feet Supported on Floor or Stool Able to sit safely and securely 2 minutes   ? Stand to Sit Sits safely with minimal use of hands   ? Transfers  Able to transfer safely, minor use of hands   ? Standing Unsupported with Eyes Closed Able to stand 10 seconds safely   ? Standing Unsupported with Feet Together Able to place feet together independently and stand 1 minute safely   ? From Standing, Reach Forward with Outstretched Arm Can reach confidently >25 cm (10")   ? From Standing Position, Pick up Object from [place] to pick up shoe safely and easily   ? From Standing Position, Turn to Look Behind Over each Shoulder Looks behind from both sides and weight shifts well   ? Turn 360 Degrees Able to turn 360 degrees safely but slowly   ? Standing Unsupported, Alternately Place Feet on Step/Stool Able to complete 4 steps without aid or supervision   ? Standing Unsupported, One Foot in Front Able to take small step independently and hold 30 seconds   ? Standing on One Leg Tries to lift leg/unable to hold 3 seconds but remains standing independently   ? Total Score 46   ? Berg comment: initial, 05/22/21 was 24/56, 08/06/2021 was 33/56, 11/28/21 was 39/56, 03/18/22 is 46/56   ?  ? Dynamic Gait Index  ? Level Surface Mild Impairment   with cane  ? Change in Gait Speed Moderate Impairment   with cane  ? Gait with Horizontal Head Turns Moderate Impairment   with cane  ? Gait  with Vertical Head Turns Moderate Impairment   with cane  ? Gait and Pivot Turn Moderate Impairment   with cane  ? Step Over Obstacle Moderate Impairment   with cane  ? Step Around Obstacles Moderate Impair

## 2022-04-03 ENCOUNTER — Encounter: Payer: Self-pay | Admitting: Physical Therapy

## 2022-04-03 ENCOUNTER — Other Ambulatory Visit: Payer: Self-pay

## 2022-04-03 ENCOUNTER — Ambulatory Visit (INDEPENDENT_AMBULATORY_CARE_PROVIDER_SITE_OTHER): Payer: BC Managed Care – PPO | Admitting: Physical Therapy

## 2022-04-03 DIAGNOSIS — M6281 Muscle weakness (generalized): Secondary | ICD-10-CM | POA: Diagnosis not present

## 2022-04-03 DIAGNOSIS — M79605 Pain in left leg: Secondary | ICD-10-CM

## 2022-04-03 DIAGNOSIS — R293 Abnormal posture: Secondary | ICD-10-CM | POA: Diagnosis not present

## 2022-04-03 DIAGNOSIS — R2681 Unsteadiness on feet: Secondary | ICD-10-CM

## 2022-04-03 DIAGNOSIS — R2689 Other abnormalities of gait and mobility: Secondary | ICD-10-CM

## 2022-04-03 DIAGNOSIS — M25652 Stiffness of left hip, not elsewhere classified: Secondary | ICD-10-CM

## 2022-04-03 DIAGNOSIS — G546 Phantom limb syndrome with pain: Secondary | ICD-10-CM

## 2022-04-03 NOTE — Patient Instructions (Signed)
Increasing your activity level is important. ? ?Short distances which is walking from one room to another. Work to increase frequency back to prior level. PT recommended short in apt with cane ~ every 30 minutes.  ? ?Medium distances are entering & exiting your home or community with limited distances. Start with 4 medium walks which is one outing to one location and increase number of tolerated amounts per day. PT recommended with single crutch carrying second crutch for fatigue & circumstances like ramp that he is not independent or comfortable.  ? ?Long distance is your highest tolerance for you. Walk until you feel you must rest. Back or leg pain or general fatigue are indicators to maximum tolerance. Monitor by distance or time. Try to walk your BEST distance 1x with 2 crutches & 1x with one crutch times per day. You should see this increase over time.  ? ?

## 2022-04-03 NOTE — Therapy (Signed)
[place] [facility] ?[address] ?[place], [place], [address] ?Phone: [phone]   Fax:  [phone] ? ?Physical Therapy Treatment ? ?Patient Details  ?Name: Mike [name] ?MRN: [id number] ?Date of Birth: 1961/06/29 ?Referring Provider (PT): Meridee Score, MD ? ? ?Encounter Date: 04/03/2022 ? ? PT End of Session - 04/03/22 1149   ? ? Visit Number 36   ? Number of Visits 47   ? Date for PT Re-Evaluation 06/12/22   ? Authorization Type BCBS   ? Authorization Time Period Office manager Effective  88/82/8003 - 12/28/9998  20% COINSURANCE 12/29/2021 - Open-ended Deductible ($1,000.00)  $0.00 Paid $1,000.00 to go Out-of-Pocket Limit ($5,050.00)  $0.00 Paid $5,050.00 to go   ? PT Start Time 1145   ? PT Stop Time 4917   ? PT Time Calculation (min) 44 min   ? Equipment Utilized During Treatment Gait belt   ? Activity Tolerance Patient tolerated treatment well;Patient limited by pain   ? Behavior During Therapy [facility] for tasks assessed/performed   ? ?  ?  ? ?  ? ? ?Past Medical History:  ?Diagnosis Date  ? Anxiety   ? CAD (coronary artery disease)   ? Cryptogenic stroke ([place]) 05/10/2021  ? Encounter for loop recorder check 07/26/2021  ? Heart attack ([place]) 08/01/2020  ? Heart failure with reduced ejection fraction ([place])   ? Hyperlipidemia   ? Hypertension   ? Loop Biotronic [id number] 05/10/2021 05/10/2021  ? PAD (peripheral artery disease) ([place])   ? right CIA & right distal iliac artery stents 10/16/20; left AKA 11/06/20  ? PFO (patent foramen ovale) 12/14/2020  ? Pneumonia   ? Pulmonary AV (arteriovenous) fistula ([place]) 12/14/2020  ? Stroke ([place]) 12/11/2020  ? no residuial effects  ? ? ?Past Surgical History:  ?Procedure Laterality Date  ? ABDOMINAL AORTOGRAM W/LOWER EXTREMITY N/A 10/16/2020  ? Procedure: ABDOMINAL AORTOGRAM W/LOWER EXTREMITY;  Surgeon: Nigel Mormon, MD;  Location: [place] [facility];  Service: Cardiovascular;  Laterality: N/A;  ? AMPUTATION Left 11/06/2020  ?  Procedure: AMPUTATION ABOVE KNEE;  Surgeon: Rosetta Posner, MD;  Location: [place];  Service: Vascular;  Laterality: Left;  ? BUBBLE STUDY  12/14/2020  ? Procedure: BUBBLE STUDY;  Surgeon: Nigel Mormon, MD;  Location: [place];  Service: Cardiovascular;;  ? CARDIAC CATHETERIZATION    ? CORONARY STENT INTERVENTION N/A 08/03/2020  ? Procedure: CORONARY STENT INTERVENTION;  Surgeon: Nigel Mormon, MD;  Location: [place] [facility];  Service: Cardiovascular;  Laterality: N/A;  ? CORONARY/GRAFT ACUTE MI REVASCULARIZATION N/A 08/01/2020  ? Procedure: Coronary/Graft Acute MI Revascularization;  Surgeon: Nigel Mormon, MD;  Location: [place] [facility];  Service: Cardiovascular;  Laterality: N/A;  ? INTRAVASCULAR LITHOTRIPSY Right 10/16/2020  ? Procedure: INTRAVASCULAR LITHOTRIPSY;  Surgeon: Nigel Mormon, MD;  Location: [place] [facility];  Service: Cardiovascular;  Laterality: Right;  Common and External Iliac  ? IR ANGIOGRAM PULMONARY LEFT SELECTIVE  03/20/2021  ? IR ANGIOGRAM SELECTIVE EACH ADDITIONAL VESSEL  03/20/2021  ? IR EMBO ARTERIAL NOT HEMORR HEMANG INC GUIDE ROADMAPPING  03/20/2021  ? IR RADIOLOGIST EVAL & MGMT  01/30/2021  ? IR RADIOLOGIST EVAL & MGMT  04/18/2021  ? IR RADIOLOGIST EVAL & MGMT  07/25/2021  ? IR US GUIDE VASC ACCESS RIGHT  03/20/2021  ? LEFT HEART CATH AND CORONARY ANGIOGRAPHY N/A 08/01/2020  ? Procedure: LEFT HEART CATH AND CORONARY ANGIOGRAPHY;  Surgeon: Nigel Mormon, MD;  Location: [place]  [facility];  Service: Cardiovascular;  Laterality: N/A;  ? LEFT HEART CATH AND CORONARY ANGIOGRAPHY N/A 08/03/2020  ? Procedure: LEFT HEART CATH AND CORONARY ANGIOGRAPHY;  Surgeon: Nigel Mormon, MD;  Location: [place] [facility];  Service: Cardiovascular;  Laterality: N/A;  ? PERIPHERAL VASCULAR INTERVENTION Right 10/16/2020  ? Procedure: PERIPHERAL VASCULAR INTERVENTION;  Surgeon: Nigel Mormon, MD;  Location: [place] [facility];  Service: Cardiovascular;  Laterality:  Right;  Common and Iliac Stent  ? RADIOLOGY WITH ANESTHESIA N/A 03/20/2021  ? Procedure: PULMONARY EMBOLIZATION;  Surgeon: Aletta Edouard, MD;  Location: [place];  Service: Radiology;  Laterality: N/A;  ? TEE WITHOUT CARDIOVERSION N/A 12/14/2020  ? Procedure: TRANSESOPHAGEAL ECHOCARDIOGRAM (TEE);  Surgeon: Nigel Mormon, MD;  Location: [place];  Service: Cardiovascular;  Laterality: N/A;  ? ? ?There were no vitals filed for this visit. ? ? Subjective Assessment - 04/03/22 1145   ? ? Subjective He went to [facility]. He rode in car wearing prosthesis 10.5 hrs, attended family wedding and took train home. Getting on & off train was challenged him.  He intermittently stood to stretch. He is wearing prosthesis 12-13 hours per day.   ? Pertinent History PAD, gangrene s/p L AKA 11/06/20, HTN, CAD s/p STEMI with stenting 08/17/2020, and CHF. cryptogenic stroke Dec 2021   ? Patient Stated Goals to return to work, (he was released from current job over last weekend but wants to find job).  to walk in community,   ? Currently in Pain? Yes   ? Pain Score 1    since last PT up to 4/10.  down to 0/10 even with prosthesis on.  ? Pain Location Leg   residual limb  ? Pain Orientation Left;Distal   ? Pain Descriptors / Indicators Discomfort;Contraction   discomfort early 1/10 and end of day contracting up to 4/10 end of day  ? Pain Onset More than a month ago   ? Pain Frequency Intermittent   ? Aggravating Factors  standing longer, dehydration   ? Pain Relieving Factors Rx meds, tylenol   ? Pain Onset More than a month ago   ? ?  ?  ? ?  ? ? ? ? ? ? ? ? ? ? ? ? ? ? ? ? ? ? ? ? [facility] Adult PT Treatment/Exercise - 04/03/22 1145   ? ?  ? Transfers  ? Transfers Sit to Stand;Stand to Sit   ? Sit to Stand 6: Modified independent (Device/Increase time);With upper extremity assist;From chair/3-in-1   to cane stand alone tip  ? Stand to Sit 6: Modified independent (Device/Increase time);With upper extremity assist   with cane stand alone tip   ?  ? Ambulation/Gait  ? Ambulation/Gait Yes   ? Ambulation/Gait Assistance 5: Supervision   ? Ambulation Distance (Feet) 150 Feet   cane 100' & 150'  ? Assistive device Prosthesis;Straight cane   cane with stand alone tip  ? Ramp 4: Min assist;5: Supervision   cane stand alone tip &  TFA prosthesis  ? Ramp Details (indicate cue type and reason) indoor ramp (12* grade) forward facing with minA using step to pattern,  indoor ramp side stepping with cane downhill UE with supervision for balance & verbal cues.  outdoor curb cut (max allowed would be 8.3*  but more like 5* outside [place]) with supervision with step-to pattern.   ? Curb 5: Supervision;4: Min assist   cane stand alone & TFA prosthesis; minA for one LOB ascending,  2 reps indoor curb & 1 rep outdoor curb.  ? Curb Details (indicate cue type and reason) demo & verbal cues on technique   ?  ? Prosthetics  ? Prosthetic Care Comments  Increase wear to all awake hours except 1 hour midday. [place] upon arising, remove for shower and doffe for day close to bed time.  PT used internet to show camping chair as option for shower when traveling.  He is getting close to being ready for socket revision. PT advised of advantages to switching ot suction ring suspension.   ? Current prosthetic wear tolerance (days/week)  daily   ? Current prosthetic wear tolerance (#hours/day)  13 hours total between 2 wears   ? Current prosthetic weight-bearing tolerance (hours/day)  10 min standing before rest as limb pain increasing.   ? Residual limb condition  reports no issues   ? Education Provided Proper wear schedule/adjustment;Other (comment)   see prosthetic care comments.  ? Person(s) Educated Patient   ? Education Method Explanation;Verbal cues   ? Education Method Verbalized understanding;Needs further instruction   ? [place] Prosthesis Modified independent (device/increased time)   ? ?  ?  ? ?  ? ? ? ? ? ? ? ? ? ? PT Education - 04/03/22 1215   ? ? Education Details  increasing activity tolerance - see pt instructions   ? Person(s) Educated Patient   ? Methods Explanation;Verbal cues   ? Comprehension Verbalized understanding;Need further instruction   ? ?  ?  ? ?  ? ? ? PT Short

## 2022-04-07 ENCOUNTER — Telehealth: Payer: Self-pay | Admitting: Cardiology

## 2022-04-07 ENCOUNTER — Other Ambulatory Visit: Payer: Self-pay

## 2022-04-07 DIAGNOSIS — I739 Peripheral vascular disease, unspecified: Secondary | ICD-10-CM

## 2022-04-07 DIAGNOSIS — I2510 Atherosclerotic heart disease of native coronary artery without angina pectoris: Secondary | ICD-10-CM

## 2022-04-07 DIAGNOSIS — I5020 Unspecified systolic (congestive) heart failure: Secondary | ICD-10-CM

## 2022-04-07 MED ORDER — RIVAROXABAN 2.5 MG PO TABS: 2.5000 mg | ORAL_TABLET | Freq: Two times a day (BID) | ORAL | 3 refills | Status: DC

## 2022-04-07 MED ORDER — ENTRESTO 49-51 MG PO TABS: 1.0000 | ORAL_TABLET | Freq: Two times a day (BID) | ORAL | 3 refills | Status: DC

## 2022-04-07 NOTE — Telephone Encounter (Signed)
Ok done

## 2022-04-07 NOTE — Telephone Encounter (Signed)
Patient needs refill for xarelto and entresto. He uses [facility] representative. ?

## 2022-04-08 ENCOUNTER — Encounter: Payer: BC Managed Care – PPO | Admitting: Physical Therapy

## 2022-04-15 ENCOUNTER — Other Ambulatory Visit: Payer: Self-pay

## 2022-04-15 ENCOUNTER — Encounter: Payer: Self-pay | Admitting: Physical Therapy

## 2022-04-15 ENCOUNTER — Ambulatory Visit (INDEPENDENT_AMBULATORY_CARE_PROVIDER_SITE_OTHER): Payer: BC Managed Care – PPO | Admitting: Physical Therapy

## 2022-04-15 DIAGNOSIS — R2681 Unsteadiness on feet: Secondary | ICD-10-CM

## 2022-04-15 DIAGNOSIS — M25652 Stiffness of left hip, not elsewhere classified: Secondary | ICD-10-CM

## 2022-04-15 DIAGNOSIS — M6281 Muscle weakness (generalized): Secondary | ICD-10-CM | POA: Diagnosis not present

## 2022-04-15 DIAGNOSIS — G546 Phantom limb syndrome with pain: Secondary | ICD-10-CM

## 2022-04-15 DIAGNOSIS — R293 Abnormal posture: Secondary | ICD-10-CM

## 2022-04-15 DIAGNOSIS — R2689 Other abnormalities of gait and mobility: Secondary | ICD-10-CM

## 2022-04-15 DIAGNOSIS — M79605 Pain in left leg: Secondary | ICD-10-CM

## 2022-04-15 DIAGNOSIS — I5022 Chronic systolic (congestive) heart failure: Secondary | ICD-10-CM | POA: Diagnosis not present

## 2022-04-15 DIAGNOSIS — Z4509 Encounter for adjustment and management of other cardiac device: Secondary | ICD-10-CM | POA: Diagnosis not present

## 2022-04-15 DIAGNOSIS — Z95818 Presence of other cardiac implants and grafts: Secondary | ICD-10-CM | POA: Diagnosis not present

## 2022-04-15 NOTE — Patient Instructions (Signed)
Continue with activity level ?Short distance (room to room) with cane increasing frequency during awake hours. In kitchen near counters try no device. ?Long distance (his maximal tolerable distance) 1x/day with 1 crutch & 1x/day with 2 crutches. ?Medium distance (between short but no long or max tolerable distance) 4-6 per day. ?

## 2022-04-15 NOTE — Telephone Encounter (Deleted)
erro

## 2022-04-15 NOTE — Therapy (Signed)
[place] [facility] ?[address] ?[place], [place], [address] ?Phone: [phone]   Fax:  [phone] ? ?Physical Therapy Treatment ? ?Patient Details  ?Name: Mike [name] ?MRN: [id number] ?Date of Birth: 22-Mar-1961 ?Referring Provider (PT): Meridee Score, MD ? ? ?Encounter Date: 04/15/2022 ? ? PT End of Session - 04/15/22 1146   ? ? Visit Number 52   ? Number of Visits 47   ? Date for PT Re-Evaluation 06/12/22   ? Authorization Type BCBS   ? Authorization Time Period Office manager Effective  64/33/2951 - 12/28/9998  20% COINSURANCE 12/29/2021 - Open-ended Deductible ($1,000.00)  $0.00 Paid $1,000.00 to go Out-of-Pocket Limit ($5,050.00)  $0.00 Paid $5,050.00 to go   ? PT Start Time 1145   ? PT Stop Time 1230   ? PT Time Calculation (min) 45 min   ? Equipment Utilized During Treatment Gait belt   ? Activity Tolerance Patient tolerated treatment well;Patient limited by pain   ? Behavior During Therapy [facility] for tasks assessed/performed   ? ?  ?  ? ?  ? ? ?Past Medical History:  ?Diagnosis Date  ? Anxiety   ? CAD (coronary artery disease)   ? Cryptogenic stroke ([place]) 05/10/2021  ? Encounter for loop recorder check 07/26/2021  ? Heart attack ([place]) 08/01/2020  ? Heart failure with reduced ejection fraction ([place])   ? Hyperlipidemia   ? Hypertension   ? Loop [employer] [id number] 05/10/2021 05/10/2021  ? PAD (peripheral artery disease) ([place])   ? right CIA & right distal iliac artery stents 10/16/20; left AKA 11/06/20  ? PFO (patent foramen ovale) 12/14/2020  ? Pneumonia   ? Pulmonary AV (arteriovenous) fistula ([place]) 12/14/2020  ? Stroke ([place]) 12/11/2020  ? no residuial effects  ? ? ?Past Surgical History:  ?Procedure Laterality Date  ? ABDOMINAL AORTOGRAM W/LOWER EXTREMITY N/A 10/16/2020  ? Procedure: ABDOMINAL AORTOGRAM W/LOWER EXTREMITY;  Surgeon: Nigel Mormon, MD;  Location: [place] [facility];  Service: Cardiovascular;  Laterality: N/A;  ? AMPUTATION Left 11/06/2020  ?  Procedure: AMPUTATION ABOVE KNEE;  Surgeon: Rosetta Posner, MD;  Location: [place];  Service: Vascular;  Laterality: Left;  ? BUBBLE STUDY  12/14/2020  ? Procedure: BUBBLE STUDY;  Surgeon: Nigel Mormon, MD;  Location: [place];  Service: Cardiovascular;;  ? CARDIAC CATHETERIZATION    ? CORONARY STENT INTERVENTION N/A 08/03/2020  ? Procedure: CORONARY STENT INTERVENTION;  Surgeon: Nigel Mormon, MD;  Location: [place] [facility];  Service: Cardiovascular;  Laterality: N/A;  ? CORONARY/GRAFT ACUTE MI REVASCULARIZATION N/A 08/01/2020  ? Procedure: Coronary/Graft Acute MI Revascularization;  Surgeon: Nigel Mormon, MD;  Location: [place] [facility];  Service: Cardiovascular;  Laterality: N/A;  ? INTRAVASCULAR LITHOTRIPSY Right 10/16/2020  ? Procedure: INTRAVASCULAR LITHOTRIPSY;  Surgeon: Nigel Mormon, MD;  Location: [place] [facility];  Service: Cardiovascular;  Laterality: Right;  Common and External Iliac  ? IR ANGIOGRAM PULMONARY LEFT SELECTIVE  03/20/2021  ? IR ANGIOGRAM SELECTIVE EACH ADDITIONAL VESSEL  03/20/2021  ? IR EMBO ARTERIAL NOT HEMORR HEMANG INC GUIDE ROADMAPPING  03/20/2021  ? IR RADIOLOGIST EVAL & MGMT  01/30/2021  ? IR RADIOLOGIST EVAL & MGMT  04/18/2021  ? IR RADIOLOGIST EVAL & MGMT  07/25/2021  ? IR US GUIDE VASC ACCESS RIGHT  03/20/2021  ? LEFT HEART CATH AND CORONARY ANGIOGRAPHY N/A 08/01/2020  ? Procedure: LEFT HEART CATH AND CORONARY ANGIOGRAPHY;  Surgeon: Nigel Mormon, MD;  Location: [place]  [facility];  Service: Cardiovascular;  Laterality: N/A;  ? LEFT HEART CATH AND CORONARY ANGIOGRAPHY N/A 08/03/2020  ? Procedure: LEFT HEART CATH AND CORONARY ANGIOGRAPHY;  Surgeon: Nigel Mormon, MD;  Location: [place] [facility];  Service: Cardiovascular;  Laterality: N/A;  ? PERIPHERAL VASCULAR INTERVENTION Right 10/16/2020  ? Procedure: PERIPHERAL VASCULAR INTERVENTION;  Surgeon: Nigel Mormon, MD;  Location: [place] [facility];  Service: Cardiovascular;  Laterality:  Right;  Common and Iliac Stent  ? RADIOLOGY WITH ANESTHESIA N/A 03/20/2021  ? Procedure: PULMONARY EMBOLIZATION;  Surgeon: Aletta Edouard, MD;  Location: [place];  Service: Radiology;  Laterality: N/A;  ? TEE WITHOUT CARDIOVERSION N/A 12/14/2020  ? Procedure: TRANSESOPHAGEAL ECHOCARDIOGRAM (TEE);  Surgeon: Nigel Mormon, MD;  Location: [place];  Service: Cardiovascular;  Laterality: N/A;  ? ? ?There were no vitals filed for this visit. ? ? Subjective Assessment - 04/15/22 1145   ? ? Subjective The lady who is handling his Long term disability needs last couple PT progress notes. Her email is [email or web address]   He is wearing prosthesis upon arising most days, removes to bath and again for ~1 hour midday and wears remainder of day until ~30 min before bedtime.   ? Pertinent History PAD, gangrene s/p L AKA 11/06/20, HTN, CAD s/p STEMI with stenting 08/17/2020, and CHF. cryptogenic stroke Dec 2021   ? Patient Stated Goals to return to work, (he was released from current job over last weekend but wants to find job).  to walk in community,   ? Currently in Pain? Yes   ? Pain Score 1    over last week, lowest 1/10 & highest 8.5 /10 but only Sunday otherwise highest 5-6/10  ? Pain Location Leg   residual limb  ? Pain Orientation Left;Distal   ? Pain Descriptors / Indicators Discomfort;Contraction   ? Pain Type Chronic pain   ? Pain Onset More than a month ago   ? Pain Frequency Intermittent   ? Aggravating Factors  Sunday was day 8.5/10 after only removing prosthesis on Saturday for 30 min & was up on feet more.   ? Pain Relieving Factors Rx meds, tylenol,   ? Pain Onset More than a month ago   ? ?  ?  ? ?  ? ? ? ? ? ? ? ? ? ? ? ? ? ? ? ? ? ? ? ? [place] Adult PT Treatment/Exercise - 04/15/22 1145   ? ?  ? Transfers  ? Transfers Sit to Stand;Stand to Sit   ? Sit to Stand 6: Modified independent (Device/Increase time);With upper extremity assist;From chair/3-in-1   to cane stand alone tip  ? Stand to Sit 6:  Modified independent (Device/Increase time);With upper extremity assist   with cane stand alone tip  ?  ? Ambulation/Gait  ? Ambulation/Gait Yes   ? Ambulation/Gait Assistance 5: Supervision;4: Min assist;4: Min guard   supervision open focused gait, minA to min guard for advanced challenged gait.  ? Ambulation/Gait Assistance Details worked on increasing gait speed to "cross a road",  negotiating over obstacle 4" ht X 8" width and negotiating around obstacles.  PT verbal, demo & manual cues for technique of each.  He improved with instruction & repetition.   ? Ambulation Distance (Feet) 150 Feet   cane 150' X 3 during PT, arrived with 2 crtuches  ? Assistive device Prosthesis;Straight cane   cane with stand alone tip  ? Ramp 4: Min assist   min gaurd, cane  stand alone tip &  TFA prosthesis  ? Ramp Details (indicate cue type and reason) 12* incline with verbal & tactile cues for prosthetic control.   ? Curb 5: Supervision   cane stand alone & TFA prosthesis  ? Curb Details (indicate cue type and reason) verbal cues on technique.   ?  ? High Level Balance  ? High Level Balance Activities Negotitating around obstacles;Negotiating over obstacles;Other (comment)   cane & TFA prosthesis,  changing gait speed  ?  ? Self-Care  ? Other Self-Care Comments  PT demo & verbal cues on carrying items with grocery bags using crutches and possibly back pack. pt verbalized understanding.   ?  ? Neuro Re-ed   ? Neuro Re-ed Details  4-square stepping with counter posteior, chair backs lateral and PT anterior for safety.  PT demo & verbal cues on technique & safety for home.  Pt performed CW & CCW with cane stand alone tip 2 reps and no device touching chair backs laterally placed 1 rep.  pt verbalized understanding as HEP.   ?  ? Prosthetics  ? Prosthetic Care Comments  Increase wear to all awake hours except 30 min midday. Donne upon arising, remove for shower and doffe for day close to bed time.   ? Current prosthetic wear tolerance  (days/week)  daily   ? Current prosthetic wear tolerance (#hours/day)  14 hours total between 2 wears   ? Current prosthetic weight-bearing tolerance (hours/day)  10 min standing before rest as limb pain increasing

## 2022-04-19 ENCOUNTER — Other Ambulatory Visit: Payer: Self-pay | Admitting: Orthopedic Surgery

## 2022-04-22 ENCOUNTER — Encounter: Payer: BC Managed Care – PPO | Admitting: Physical Therapy

## 2022-04-25 ENCOUNTER — Telehealth: Payer: Self-pay | Admitting: Cardiology

## 2022-04-25 ENCOUNTER — Other Ambulatory Visit: Payer: Self-pay

## 2022-04-25 DIAGNOSIS — I2510 Atherosclerotic heart disease of native coronary artery without angina pectoris: Secondary | ICD-10-CM

## 2022-04-25 DIAGNOSIS — I739 Peripheral vascular disease, unspecified: Secondary | ICD-10-CM

## 2022-04-25 MED ORDER — ASPIRIN EC 81 MG PO TBEC: 81.0000 mg | DELAYED_RELEASE_TABLET | Freq: Every day | ORAL | 3 refills | Status: DC

## 2022-04-25 NOTE — Telephone Encounter (Signed)
Done

## 2022-04-25 NOTE — Telephone Encounter (Signed)
Patient requesting refill on aspirin. ?

## 2022-04-29 ENCOUNTER — Encounter: Payer: BC Managed Care – PPO | Admitting: Physical Therapy

## 2022-04-30 ENCOUNTER — Encounter: Payer: BC Managed Care – PPO | Admitting: Physical Therapy

## 2022-05-06 ENCOUNTER — Encounter: Payer: BC Managed Care – PPO | Admitting: Physical Therapy

## 2022-05-06 ENCOUNTER — Other Ambulatory Visit: Payer: Self-pay

## 2022-05-06 DIAGNOSIS — I2510 Atherosclerotic heart disease of native coronary artery without angina pectoris: Secondary | ICD-10-CM

## 2022-05-06 DIAGNOSIS — I5020 Unspecified systolic (congestive) heart failure: Secondary | ICD-10-CM

## 2022-05-06 DIAGNOSIS — I739 Peripheral vascular disease, unspecified: Secondary | ICD-10-CM

## 2022-05-06 MED ORDER — RIVAROXABAN 2.5 MG PO TABS: 2.5000 mg | ORAL_TABLET | Freq: Two times a day (BID) | ORAL | 3 refills | Status: DC

## 2022-05-06 MED ORDER — FUROSEMIDE 20 MG PO TABS: 20.0000 mg | ORAL_TABLET | Freq: Every day | ORAL | 1 refills | Status: DC

## 2022-05-06 MED ORDER — ENTRESTO 49-51 MG PO TABS: 1.0000 | ORAL_TABLET | Freq: Two times a day (BID) | ORAL | 3 refills | Status: DC

## 2022-05-13 ENCOUNTER — Ambulatory Visit (INDEPENDENT_AMBULATORY_CARE_PROVIDER_SITE_OTHER): Payer: BC Managed Care – PPO | Admitting: Physical Therapy

## 2022-05-13 ENCOUNTER — Other Ambulatory Visit: Payer: Self-pay

## 2022-05-13 ENCOUNTER — Encounter: Payer: Self-pay | Admitting: Physical Therapy

## 2022-05-13 DIAGNOSIS — M25652 Stiffness of left hip, not elsewhere classified: Secondary | ICD-10-CM

## 2022-05-13 DIAGNOSIS — R293 Abnormal posture: Secondary | ICD-10-CM

## 2022-05-13 DIAGNOSIS — M79605 Pain in left leg: Secondary | ICD-10-CM

## 2022-05-13 DIAGNOSIS — M6281 Muscle weakness (generalized): Secondary | ICD-10-CM | POA: Diagnosis not present

## 2022-05-13 DIAGNOSIS — R2689 Other abnormalities of gait and mobility: Secondary | ICD-10-CM

## 2022-05-13 DIAGNOSIS — R2681 Unsteadiness on feet: Secondary | ICD-10-CM | POA: Diagnosis not present

## 2022-05-13 DIAGNOSIS — G546 Phantom limb syndrome with pain: Secondary | ICD-10-CM
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
## 2022-05-16 DIAGNOSIS — I5020 Unspecified systolic (congestive) heart failure: Secondary | ICD-10-CM | POA: Diagnosis not present

## 2022-05-16 DIAGNOSIS — Z4509 Encounter for adjustment and management of other cardiac device: Secondary | ICD-10-CM | POA: Diagnosis not present

## 2022-05-16 DIAGNOSIS — Z95818 Presence of other cardiac implants and grafts: Secondary | ICD-10-CM | POA: Diagnosis not present

## 2022-05-22 ENCOUNTER — Other Ambulatory Visit: Payer: Self-pay

## 2022-05-22 ENCOUNTER — Ambulatory Visit (INDEPENDENT_AMBULATORY_CARE_PROVIDER_SITE_OTHER): Payer: BC Managed Care – PPO | Admitting: Physical Therapy

## 2022-05-22 ENCOUNTER — Encounter: Payer: Self-pay | Admitting: Physical Therapy

## 2022-05-22 DIAGNOSIS — M25652 Stiffness of left hip, not elsewhere classified: Secondary | ICD-10-CM

## 2022-05-22 DIAGNOSIS — M6281 Muscle weakness (generalized): Secondary | ICD-10-CM

## 2022-05-22 DIAGNOSIS — I5020 Unspecified systolic (congestive) heart failure: Secondary | ICD-10-CM

## 2022-05-22 DIAGNOSIS — R2689 Other abnormalities of gait and mobility: Secondary | ICD-10-CM

## 2022-05-22 DIAGNOSIS — R293 Abnormal posture: Secondary | ICD-10-CM | POA: Diagnosis not present

## 2022-05-22 DIAGNOSIS — R2681 Unsteadiness on feet: Secondary | ICD-10-CM

## 2022-05-22 DIAGNOSIS — I2510 Atherosclerotic heart disease of native coronary artery without angina pectoris: Secondary | ICD-10-CM

## 2022-05-22 DIAGNOSIS — I739 Peripheral vascular disease, unspecified: Secondary | ICD-10-CM

## 2022-05-22 DIAGNOSIS — M79605 Pain in left leg: Secondary | ICD-10-CM

## 2022-05-22 DIAGNOSIS — G546 Phantom limb syndrome with pain: Secondary | ICD-10-CM

## 2022-05-22 MED ORDER — METOPROLOL SUCCINATE ER 25 MG PO TB24: 25.0000 mg | ORAL_TABLET | Freq: Every day | ORAL | 3 refills | Status: DC

## 2022-05-22 MED ORDER — ASPIRIN 81 MG PO TBEC: 81.0000 mg | DELAYED_RELEASE_TABLET | Freq: Every day | ORAL | 0 refills | Status: DC

## 2022-05-22 NOTE — Therapy (Signed)
[place], [place], [address] Phone: [phone]   Fax:  [phone]  Physical Therapy Treatment  Patient Details  Name: Mike [name] MRN: [id number] Date of Birth: Nov 03, 1961 Referring Provider (PT): Meridee Score, MD   Encounter Date: 05/22/2022   PT End of Session - 05/22/22 0800     Visit Number 39    Number of Visits 49    Date for PT Re-Evaluation 06/12/22    Authorization Type BCBS    Authorization Time Period BCBS  Active Policy  Policy Effective  35/36/1443 - 12/28/9998  20% COINSURANCE 12/29/2021 - Open-ended Deductible ($1,000.00)  $0.00 Paid $1,000.00 to go Out-of-Pocket Limit ($5,050.00)  $0.00 Paid $5,050.00 to go    PT Start Time 0800    PT Stop Time 0845    PT Time Calculation (min) 45 min    Equipment Utilized During Treatment Gait belt    Activity Tolerance Patient tolerated treatment well;Patient limited by pain    Behavior During Therapy [facility] for tasks assessed/performed             Past Medical History:  Diagnosis Date   Anxiety    CAD (coronary artery disease)    Cryptogenic stroke ([place]) 05/10/2021   Encounter for loop recorder check 07/26/2021   Heart attack ([place]) 08/01/2020   Heart failure with reduced ejection fraction ([place])    Hyperlipidemia    Hypertension    Loop [employer] [id number] 05/10/2021 05/10/2021   PAD (peripheral artery disease) ([place])    right CIA & right distal iliac artery stents 10/16/20; left AKA 11/06/20   PFO (patent foramen ovale) 12/14/2020   Pneumonia    Pulmonary AV (arteriovenous) fistula ([place]) 12/14/2020   Stroke ([place]) 12/11/2020   no residuial effects    Past Surgical History:  Procedure Laterality Date   ABDOMINAL AORTOGRAM W/LOWER EXTREMITY N/A 10/16/2020   Procedure: ABDOMINAL AORTOGRAM W/LOWER EXTREMITY;  Surgeon: Nigel Mormon, MD;  Location: [place] [facility];  Service: Cardiovascular;  Laterality: N/A;   AMPUTATION Left 11/06/2020    Procedure: AMPUTATION ABOVE KNEE;  Surgeon: Rosetta Posner, MD;  Location: [place];  Service: Vascular;  Laterality: Left;   BUBBLE STUDY  12/14/2020   Procedure: BUBBLE STUDY;  Surgeon: Nigel Mormon, MD;  Location: [place];  Service: Cardiovascular;;   CARDIAC CATHETERIZATION     CORONARY STENT INTERVENTION N/A 08/03/2020   Procedure: CORONARY STENT INTERVENTION;  Surgeon: Nigel Mormon, MD;  Location: [place] [facility];  Service: Cardiovascular;  Laterality: N/A;   CORONARY/GRAFT ACUTE MI REVASCULARIZATION N/A 08/01/2020   Procedure: Coronary/Graft Acute MI Revascularization;  Surgeon: Nigel Mormon, MD;  Location: [place] [facility];  Service: Cardiovascular;  Laterality: N/A;   INTRAVASCULAR LITHOTRIPSY Right 10/16/2020   Procedure: INTRAVASCULAR LITHOTRIPSY;  Surgeon: Nigel Mormon, MD;  Location: [place] [facility];  Service: Cardiovascular;  Laterality: Right;  Common and External Iliac   IR ANGIOGRAM PULMONARY LEFT SELECTIVE  03/20/2021   IR ANGIOGRAM SELECTIVE EACH ADDITIONAL VESSEL  03/20/2021   IR EMBO ARTERIAL NOT HEMORR HEMANG INC GUIDE ROADMAPPING  03/20/2021   IR RADIOLOGIST EVAL & MGMT  01/30/2021   IR RADIOLOGIST EVAL & MGMT  04/18/2021   IR RADIOLOGIST EVAL & MGMT  07/25/2021   IR US GUIDE VASC ACCESS RIGHT  03/20/2021   LEFT HEART CATH AND CORONARY ANGIOGRAPHY N/A 08/01/2020   Procedure: LEFT HEART CATH AND CORONARY ANGIOGRAPHY;  Surgeon: Nigel Mormon, MD;  Location: [place]  [facility];  Service: Cardiovascular;  Laterality: N/A;   LEFT HEART CATH AND CORONARY ANGIOGRAPHY N/A 08/03/2020   Procedure: LEFT HEART CATH AND CORONARY ANGIOGRAPHY;  Surgeon: Nigel Mormon, MD;  Location: [place] [facility];  Service: Cardiovascular;  Laterality: N/A;   PERIPHERAL VASCULAR INTERVENTION Right 10/16/2020   Procedure: PERIPHERAL VASCULAR INTERVENTION;  Surgeon: Nigel Mormon, MD;  Location: [place] [facility];  Service: Cardiovascular;  Laterality:  Right;  Common and Iliac Stent   RADIOLOGY WITH ANESTHESIA N/A 03/20/2021   Procedure: PULMONARY EMBOLIZATION;  Surgeon: Aletta Edouard, MD;  Location: [place];  Service: Radiology;  Laterality: N/A;   TEE WITHOUT CARDIOVERSION N/A 12/14/2020   Procedure: TRANSESOPHAGEAL ECHOCARDIOGRAM (TEE);  Surgeon: Nigel Mormon, MD;  Location: [facility];  Service: Cardiovascular;  Laterality: N/A;    There were no vitals filed for this visit.   Subjective Assessment - 05/22/22 0800     Subjective He has been trying to walk in home with cane without issues & kitchen without device but does not feel steady.   He is trying to walking in community with single crutch some but limits due to slowness and anticipates distance too far.  No near falls or falls.  He is wearing prosthesis 12-12.5 hrs with removal ~1/2 hour mid-day. He puts it on ~30-45 min after arising and in evenings ~0.5 hours.    Pertinent History PAD, gangrene s/p L AKA 11/06/20, HTN, CAD s/p STEMI with stenting 08/17/2020, and CHF. cryptogenic stroke Dec 2021    Patient Stated Goals to return to work, (he was released from current job over last weekend but wants to find job).  to walk in community,    Currently in Pain? Yes    Pain Score 5    5.5 but may be due to early morning which are worse,  ranging  0.5/10 to 5.5/10   Pain Location Leg   residual limb   Pain Orientation Left;Distal    Pain Descriptors / Indicators Discomfort;Contraction    Pain Type Chronic pain    Pain Onset More than a month ago    Pain Frequency Intermittent    Aggravating Factors  no idea    Pain Relieving Factors Rx meds & tylenol    Pain Onset More than a month ago                               [facility] Adult PT Treatment/Exercise - 05/22/22 0800       Transfers   Transfers Sit to Stand;Stand to Sit    Sit to Stand 6: Modified independent (Device/Increase time);With upper extremity assist;From chair/3-in-1   to cane stand alone tip    Stand to Sit 6: Modified independent (Device/Increase time);With upper extremity assist   with cane stand alone tip     Ambulation/Gait   Ambulation/Gait Yes    Ambulation/Gait Assistance 5: Supervision   with all devices noted but no device   Ambulation Distance (Feet) 110 Feet   85' with cane, 110' with single right crutch, 10' X 10 in //bars without seated rest   Assistive device Prosthesis;Straight cane   cane with stand alone tip   Ramp 5: Supervision   right forearm crutch &  TFA prosthesis   Ramp Details (indicate cue type and reason) verbal cues on technique, no noted instability    Curb 5: Supervision   right forearm crutch &  TFA prosthesis  Curb Details (indicate cue type and reason) cues on using momentum for balance    Gait Comments PT recommended household gait with cane with goal higher frequency, kitchen without device, basic community with single crutch 4-6 times per day and 1 long (max tolerable) walk with 2 crutches & 1 long with single crutch.  PT recommended initiating community with single crutch until he begins to fatigue including PT rationale. pt verbalized understanding.      High Level Balance   High Level Balance Activities Side stepping;Backward walking;Turns   inside //bars & home with counter or rail on his right side.   High Level Balance Comments verbal cues on technique and how to perform at home. pt verbalized understanding.      Prosthetics   Prosthetic Care Comments  discussion on goal to donne upon arising & doffe going to bed.  PT educated on fall risk & overuse of RLE when prosthesis is off.  PT recommended donning upon arising to narrow that window of time with prosthesis off for the next week.  PT educated on difference in normal gait vs pathological gait vs optimal gait with TFA prosthesis.    Current prosthetic wear tolerance (days/week)  daily    Current prosthetic wear tolerance (#hours/day)  12-12.5 hours    Residual limb condition  reports no  issues    Education Provided Proper wear schedule/adjustment;Other (comment)   see prosthetic care comments.   Person(s) Educated Patient    Education Method Explanation;Verbal cues    Education Method Verbalized understanding;Verbal cues required;Needs further instruction                     PT Education - 05/22/22 0900     Education Details disability with his company vs goverment, use of his education & age as components for determination to enable job with less physical demands    Person(s) Educated Patient    Methods Explanation;Verbal cues    Comprehension Verbalized understanding              PT Short Term Goals - 05/13/22 1650       PT SHORT TERM GOAL #1   Title Patient reports wearing prosthesis >14 hours 7 days /wk. with residual limb pain </=4/10    Baseline 05/13/2022 progressing    Time 1    Period Months    Status Partially Met    Target Date 04/17/22      PT SHORT TERM GOAL #2   Title Patient ambulates 300' with cane & prosthesis with supervision / cues only, no loss of balance    Time 1    Period Months    Status Achieved    Target Date 04/17/22      PT SHORT TERM GOAL #3   Title Timed Up & Go with cane <19sec with no LOB    Time 1    Period Months    Status Achieved    Target Date 04/17/22               PT Long Term Goals - 03/18/22 1721       PT LONG TERM GOAL #1   Title Patient tolerates wear >90% of awake hours without skin issues and limb pain <2/10.    Time 12    Period Weeks    Status Revised    Target Date 06/12/22      PT LONG TERM GOAL #2   Title Timed Up & Go standard with cane  and prosthesis < 13.5sec and cognitive TUG <15.5sec.  TUG with prosthesis only <25sec.    Time 12    Period Weeks    Status Revised    Target Date 06/12/22      PT LONG TERM GOAL #3   Title dynamic gait index with prosthesis & cane >/= 16/24.    Time 12    Period Weeks    Status New    Target Date 06/12/22      PT LONG TERM GOAL  #4   Title Patient ambulates 500' with cane & prosthesis modified independent.    Time 12    Period Weeks    Status Revised    Target Date 06/12/22      PT LONG TERM GOAL #5   Title Patient negotiates ramps, curbs & stairs single rail with cane & prosthesis modified independent.    Time 12    Period Weeks    Status Revised    Target Date 06/12/22      PT LONG TERM GOAL #6   Title Patient ambulates 100' carrying <5# item with prosthesis only modified independent.    Time 12    Period Weeks    Status New    Target Date 06/12/22                   Plan - 05/22/22 0816     Clinical Impression Statement Pt appeared safer for mobility with single crutch and cane. he has improved his standing activity tolerance. He appears to understand gait at home near support on right side working on no device.    Personal Factors and Comorbidities Comorbidity 3+;Fitness;Time since onset of injury/illness/exacerbation    Comorbidities PAD, gangrene s/p L AKA 11/06/20, HTN, CAD s/p STEMI with stenting 08/17/2020, and CHF. cryptogenic stroke Dec 2021    Examination-Activity Limitations Lift;Locomotion Level;Squat;Stairs;Stand;Transfers;Carry    Examination-Participation Restrictions Community Activity;Occupation    Stability/Clinical Decision Making Evolving/Moderate complexity    Rehab Potential Good    PT Frequency 1x / week    PT Duration 12 weeks    PT Treatment/Interventions ADLs/Self Care Home Management;DME Instruction;Gait training;Stair training;Functional mobility training;Therapeutic activities;Therapeutic exercise;Balance training;Neuromuscular re-education;Patient/family education;Prosthetic Training;Manual techniques;Passive range of motion;Ultrasound;Vestibular;Other (comment)   dry needling   PT Next Visit Plan work towards [place], continue to progress activity tolerance with gait & balance with weekly pt education.    Consulted and Agree with Plan of Care Patient              Patient will benefit from skilled therapeutic intervention in order to improve the following deficits and impairments:  Decreased activity tolerance, Decreased balance, Decreased endurance, Decreased knowledge of use of DME, Decreased mobility, Decreased range of motion, Decreased strength, Increased edema, Postural dysfunction, Prosthetic Dependency, Pain, Abnormal gait  Visit Diagnosis: Other abnormalities of gait and mobility  Muscle weakness (generalized)  Unsteadiness on feet  Abnormal posture  Stiffness of left hip, not elsewhere classified  Pain in left leg  Phantom limb syndrome with pain [facility])     Problem List Patient Active Problem List   Diagnosis Date Noted   Encounter for loop recorder check 07/26/2021   Cryptogenic stroke ([place]) 05/10/2021   Loop [employer] [id number] 05/10/2021 05/10/2021   AVM (arteriovenous malformation) 03/20/2021   Pulmonary AV (arteriovenous) fistula ([place]) 02/20/2021   Pre-ulcerative calluses 12/20/2020   Cerebral thrombosis with cerebral infarction 12/11/2020   Cerebrovascular accident (CVA) due to embolism of cerebral artery ([place]) 12/11/2020   Pressure injury of skin  11/12/2020   Acute urinary retention 11/09/2020   Normal anion gap metabolic acidosis 20/72/1828   Hypotension 11/07/2020   Chest pain 10/31/2020   Acute on chronic HFrEF (heart failure with reduced ejection fraction) ([place])    Goals of care, counseling/discussion    Palliative care by specialist    DNR (do not resuscitate)    Critical limb ischemia with history of revascularization of same extremity ([place]) 10/30/2020   Hyperglycemia 10/30/2020   Tobacco abuse 10/30/2020   Leukocytosis 10/30/2020   Chronic combined systolic and diastolic CHF (congestive heart failure) ([place]) 10/30/2020   Ischemic cardiomyopathy 10/24/2020   AKI (acute kidney injury) ([place]) 10/17/2020   PAD (peripheral artery disease) ([place]) 10/16/2020   Critical lower limb ischemia ([place]) 10/05/2020    Rest pain of left upper extremity due to atherosclerosis ([place]) 10/05/2020   Severe claudication ([place]) 09/20/2020   Paresthesia of both lower extremities 09/20/2020   H/O cardiac arrest 09/20/2020   H/O acute myocardial infarction 08/14/2020   Snoring 08/14/2020   Sinus pause 08/14/2020   Mixed hyperlipidemia 08/14/2020   Tobacco dependence 08/14/2020   Coronary artery disease involving native coronary artery of native heart without angina pectoris 08/10/2020   HFrEF (heart failure with reduced ejection fraction) ([place]) 08/10/2020   Nonrheumatic mitral valve regurgitation 08/10/2020   STEMI (ST elevation myocardial infarction) ([place]) 08/02/2020   Accelerated hypertension 08/01/2020    Jamey Reas, PT, DPT 05/22/2022, 9:04 AM  [facility] Physical Therapy [address] [place], [place], [address] Phone: [phone]   Fax:  [phone]  Name: Mike [name] MRN: [id number] Date of Birth: 09-11-1961

## 2022-05-29 ENCOUNTER — Encounter: Payer: Self-pay | Admitting: Physical Therapy

## 2022-05-29 ENCOUNTER — Ambulatory Visit (INDEPENDENT_AMBULATORY_CARE_PROVIDER_SITE_OTHER): Payer: BC Managed Care – PPO | Admitting: Physical Therapy

## 2022-05-29 DIAGNOSIS — R2689 Other abnormalities of gait and mobility: Secondary | ICD-10-CM | POA: Diagnosis not present

## 2022-05-29 DIAGNOSIS — M6281 Muscle weakness (generalized): Secondary | ICD-10-CM

## 2022-05-29 DIAGNOSIS — M25652 Stiffness of left hip, not elsewhere classified: Secondary | ICD-10-CM

## 2022-05-29 DIAGNOSIS — G546 Phantom limb syndrome with pain: Secondary | ICD-10-CM

## 2022-05-29 DIAGNOSIS — R2681 Unsteadiness on feet: Secondary | ICD-10-CM

## 2022-05-29 DIAGNOSIS — R293 Abnormal posture: Secondary | ICD-10-CM | POA: Diagnosis not present

## 2022-05-29 DIAGNOSIS — M79605 Pain in left leg: Secondary | ICD-10-CM

## 2022-05-29 NOTE — Therapy (Signed)
[facility] [place], [place], [address] Phone: [phone]   Fax:  [phone]  Physical Therapy Treatment  Patient Details  Name: Mike [name] MRN: [id number] Date of Birth: 05/15/1961 Referring Provider (PT): Meridee Score, MD   Encounter Date: 05/29/2022   PT End of Session - 05/29/22 0759     Visit Number 40    Number of Visits 58    Date for PT Re-Evaluation 06/12/22    Authorization Type BCBS    Authorization Time Period BCBS  Active Policy  Policy Effective  37/48/2707 - 12/28/9998  20% COINSURANCE 12/29/2021 - Open-ended Deductible ($1,000.00)  $0.00 Paid $1,000.00 to go Out-of-Pocket Limit ($5,050.00)  $0.00 Paid $5,050.00 to go    PT Start Time 0800    PT Stop Time 0845    PT Time Calculation (min) 45 min    Equipment Utilized During Treatment Gait belt    Activity Tolerance Patient tolerated treatment well;Patient limited by pain    Behavior During Therapy [facility] for tasks assessed/performed             Past Medical History:  Diagnosis Date   Anxiety    CAD (coronary artery disease)    Cryptogenic stroke ([place]) 05/10/2021   Encounter for loop recorder check 07/26/2021   Heart attack ([place]) 08/01/2020   Heart failure with reduced ejection fraction ([place])    Hyperlipidemia    Hypertension    Loop [employer]ot[employer] [id number] 05/10/2021 05/10/2021   PAD (peripheral artery disease) ([place])    right CIA & right distal iliac artery stents 10/16/20; left AKA 11/06/20   PFO (patent foramen ovale) 12/14/2020   Pneumonia    Pulmonary AV (arteriovenous) fistula ([place]) 12/14/2020   Stroke ([place]) 12/11/2020   no residuial effects    Past Surgical History:  Procedure Laterality Date   ABDOMINAL AORTOGRAM W/LOWER EXTREMITY N/A 10/16/2020   Procedure: ABDOMINAL AORTOGRAM W/LOWER EXTREMITY;  Surgeon: Nigel Mormon, MD;  Location: [facility];  Service: Cardiovascular;  Laterality: N/A;   AMPUTATION Left 11/06/2020    Procedure: AMPUTATION ABOVE KNEE;  Surgeon: Rosetta Posner, MD;  Location: [place];  Service: Vascular;  Laterality: Left;   BUBBLE STUDY  12/14/2020   Procedure: BUBBLE STUDY;  Surgeon: Nigel Mormon, MD;  Location: [facility];  Service: Cardiovascular;;   CARDIAC CATHETERIZATION     CORONARY STENT INTERVENTION N/A 08/03/2020   Procedure: CORONARY STENT INTERVENTION;  Surgeon: Nigel Mormon, MD;  Location: [facility] [facility];  Service: Cardiovascular;  Laterality: N/A;   CORONARY/GRAFT ACUTE MI REVASCULARIZATION N/A 08/01/2020   Procedure: Coronary/Graft Acute MI Revascularization;  Surgeon: Nigel Mormon, MD;  Location: [place] [facility];  Service: Cardiovascular;  Laterality: N/A;   INTRAVASCULAR LITHOTRIPSY Right 10/16/2020   Procedure: INTRAVASCULAR LITHOTRIPSY;  Surgeon: Nigel Mormon, MD;  Location: [facility] [place] [facility];  Service: Cardiovascular;  Laterality: Right;  Common and External Iliac   IR ANGIOGRAM PULMONARY LEFT SELECTIVE  03/20/2021   IR ANGIOGRAM SELECTIVE EACH ADDITIONAL VESSEL  03/20/2021   IR EMBO ARTERIAL NOT HEMORR HEMANG INC GUIDE ROADMAPPING  03/20/2021   IR RADIOLOGIST EVAL & MGMT  01/30/2021   IR RADIOLOGIST EVAL & MGMT  04/18/2021   IR RADIOLOGIST EVAL & MGMT  07/25/2021   IR US GUIDE VASC ACCESS RIGHT  03/20/2021   LEFT HEART CATH AND CORONARY ANGIOGRAPHY N/A 08/01/2020   Procedure: LEFT HEART CATH AND CORONARY ANGIOGRAPHY;  Surgeon: Nigel Mormon, MD;  Location: [facility];  Service: Cardiovascular;  Laterality: N/A;   LEFT HEART CATH AND CORONARY ANGIOGRAPHY N/A 08/03/2020   Procedure: LEFT HEART CATH AND CORONARY ANGIOGRAPHY;  Surgeon: Nigel Mormon, MD;  Location: [place] [facility];  Service: Cardiovascular;  Laterality: N/A;   PERIPHERAL VASCULAR INTERVENTION Right 10/16/2020   Procedure: PERIPHERAL VASCULAR INTERVENTION;  Surgeon: Nigel Mormon, MD;  Location: [facility];  Service: Cardiovascular;  Laterality:  Right;  Common and Iliac Stent   RADIOLOGY WITH ANESTHESIA N/A 03/20/2021   Procedure: PULMONARY EMBOLIZATION;  Surgeon: Aletta Edouard, MD;  Location: [facility];  Service: Radiology;  Laterality: N/A;   TEE WITHOUT CARDIOVERSION N/A 12/14/2020   Procedure: TRANSESOPHAGEAL ECHOCARDIOGRAM (TEE);  Surgeon: Nigel Mormon, MD;  [facility];  Service: Cardiovascular;  Laterality: N/A;    There were no vitals filed for this visit.   Subjective Assessment - 05/29/22 0759     Subjective He does use shrinker at night.  His pain is worse in mornings.  He has been trying no device in kitchen, cane in apt and using one crutch leaving apt ~50% of time until feels need.  He has door with autommatic closure that he finds difficult with one crutch.  Going out the door is on left so can not use crutch to block the door.    Pertinent History PAD, gangrene s/p L AKA 11/06/20, HTN, CAD s/p STEMI with stenting 08/17/2020, and CHF. cryptogenic stroke Dec 2021    Patient Stated Goals to return to work, (he was released from current job over last weekend but wants to find job).  to walk in community,    Currently in Pain? Yes    Pain Score 1    this morning 1.5/10,  over last week ranging 1/10 up to 6/10   Pain Location Leg   residual limb   Pain Orientation Left;Distal    Pain Descriptors / Indicators Discomfort;Contraction    Pain Type Chronic pain    Pain Onset More than a month ago    Pain Frequency Intermittent    Aggravating Factors  noticed worse on rainy days    Pain Relieving Factors Rx meds & tylenol    Pain Onset More than a month ago                               [facility] Adult PT Treatment/Exercise - 05/29/22 0800       Transfers   Transfers Sit to Stand;Stand to Sit    Sit to Stand 6: Modified independent (Device/Increase time);With upper extremity assist;From chair/3-in-1   to cane stand alone tip   Stand to Sit 6: Modified independent (Device/Increase time);With  upper extremity assist   with cane stand alone tip     Ambulation/Gait   Ambulation/Gait Yes    Ambulation/Gait Assistance 5: Supervision   with all devices noted   Ambulation/Gait Assistance Details see advanced balance for gait tasks.    Ambulation Distance (Feet) 250 Feet   250' right crutch, 100' X 2 cane, 25' X 3 no device   Assistive device Prosthesis;Straight cane   cane with stand alone tip   Door Management 5: Supervision   right forearm crutch & TFA prosthesis   Door Managment Details (indicate cue type and reason) PT verbal cues on managing automatic closure doors with resistance both opening pulling & pushing with door on right & on left.  pt return demo &  verbalized understanding. The door at his apt requires to step down threshold right at door with exiting.  Pt reports that his door feels heavier but he will try technique from today.    Ramp 5: Supervision   cane stand alone tip &  TFA prosthesis   Ramp Details (indicate cue type and reason) 12* incline, verbal cues minimally    Curb 5: Supervision   cane stand alone tip &  TFA prosthesis   Curb Details (indicate cue type and reason) 8" curb with demo & verbal cues on technique including foot placement    Gait Comments PT recommended continue with household gait with cane with goal higher frequency, kitchen without device, basic community with single crutch 4-6 times per day and 1 long (max tolerable) walk with 2 crutches & 1 long with single crutch.  PT recommended initiating community with single crutch until he begins to fatigue including PT rationale. pt verbalized understanding.      High Level Balance   High Level Balance Activities Turns;Negotitating around obstacles;Other (comment)   picking up speed to "cross street" with cane supervision and neg obstacles with no device with min guard PT   High Level Balance Comments ambulating around obstacles to right & left and U-turn around obstacle with prosthesis on inside  (supervision) & on outside (min guard).  carrying empty trash can.      Therapeutic Activites    Lifting lifting empty trash can with ht just below knee level and carrying it 20' with 180* turn.  min gaurd & verbal cues.      Prosthetics   Prosthetic Care Comments  --    Current prosthetic wear tolerance (days/week)  daily    Current prosthetic wear tolerance (#hours/day)  12-15 hours    Residual limb condition  reports no issues    Education Provided Proper wear schedule/adjustment   see prosthetic care comments.   Person(s) Educated Patient    Education Method Explanation;Verbal cues    Education Method Verbalized understanding;Verbal cues required;Needs further instruction                       PT Short Term Goals - 05/13/22 1650       PT SHORT TERM GOAL #1   Title Patient reports wearing prosthesis >14 hours 7 days /wk. with residual limb pain </=4/10    Baseline 05/13/2022 progressing    Time 1    Period Months    Status Partially Met    Target Date 04/17/22      PT SHORT TERM GOAL #2   Title Patient ambulates 300' with cane & prosthesis with supervision / cues only, no loss of balance    Time 1    Period Months    Status Achieved    Target Date 04/17/22      PT SHORT TERM GOAL #3   Title Timed Up & Go with cane <19sec with no LOB    Time 1    Period Months    Status Achieved    Target Date 04/17/22               PT Long Term Goals - 03/18/22 1721       PT LONG TERM GOAL #1   Title Patient tolerates wear >90% of awake hours without skin issues and limb pain <2/10.    Time 12    Period Weeks    Status Revised    Target Date 06/12/22  PT LONG TERM GOAL #2   Title Timed Up & Go standard with cane and prosthesis < 13.5sec and cognitive TUG <15.5sec.  TUG with prosthesis only <25sec.    Time 12    Period Weeks    Status Revised    Target Date 06/12/22      PT LONG TERM GOAL #3   Title dynamic gait index with prosthesis & cane >/=  16/24.    Time 12    Period Weeks    Status New    Target Date 06/12/22      PT LONG TERM GOAL #4   Title Patient ambulates 500' with cane & prosthesis modified independent.    Time 12    Period Weeks    Status Revised    Target Date 06/12/22      PT LONG TERM GOAL #5   Title Patient negotiates ramps, curbs & stairs single rail with cane & prosthesis modified independent.    Time 12    Period Weeks    Status Revised    Target Date 06/12/22      PT LONG TERM GOAL #6   Title Patient ambulates 100' carrying <5# item with prosthesis only modified independent.    Time 12    Period Weeks    Status New    Target Date 06/12/22                   Plan - 05/29/22 1418     Clinical Impression Statement pt appears to have better understanding for managing doors with resistance with single crutch.  He improved gait with cane & without device but requires supervision.    Personal Factors and Comorbidities Comorbidity 3+;Fitness;Time since onset of injury/illness/exacerbation    Comorbidities PAD, gangrene s/p L AKA 11/06/20, HTN, CAD s/p STEMI with stenting 08/17/2020, and CHF. cryptogenic stroke Dec 2021    Examination-Activity Limitations Lift;Locomotion Level;Squat;Stairs;Stand;Transfers;Carry    Examination-Participation Restrictions Community Activity;Occupation    Stability/Clinical Decision Making Evolving/Moderate complexity    Rehab Potential Good    PT Frequency 1x / week    PT Duration 12 weeks    PT Treatment/Interventions ADLs/Self Care Home Management;DME Instruction;Gait training;Stair training;Functional mobility training;Therapeutic activities;Therapeutic exercise;Balance training;Neuromuscular re-education;Patient/family education;Prosthetic Training;Manual techniques;Passive range of motion;Ultrasound;Vestibular;Other (comment)   dry needling   PT Next Visit Plan instruct in wrapping limb for night to determine if controls limb pain, work towards [place], continue to  progress activity tolerance with gait & balance with weekly pt education.    Consulted and Agree with Plan of Care Patient             Patient will benefit from skilled therapeutic intervention in order to improve the following deficits and impairments:  Decreased activity tolerance, Decreased balance, Decreased endurance, Decreased knowledge of use of DME, Decreased mobility, Decreased range of motion, Decreased strength, Increased edema, Postural dysfunction, Prosthetic Dependency, Pain, Abnormal gait  Visit Diagnosis: Other abnormalities of gait and mobility  Muscle weakness (generalized)  Unsteadiness on feet  Abnormal posture  Stiffness of left hip, not elsewhere classified  Pain in left leg  Phantom limb syndrome with pain [facility])     Problem List Patient Active Problem List   Diagnosis Date Noted   Encounter for loop recorder check 07/26/2021   Cryptogenic stroke ([place]) 05/10/2021   Loop [employer] [id number] 05/10/2021 05/10/2021   AVM (arteriovenous malformation) 03/20/2021   Pulmonary AV (arteriovenous) fistula ([place]) 02/20/2021   Pre-ulcerative calluses 12/20/2020   Cerebral thrombosis with cerebral infarction  12/11/2020   Cerebrovascular accident (CVA) due to embolism of cerebral artery ([place]) 12/11/2020   Pressure injury of skin 11/12/2020   Acute urinary retention 11/09/2020   Normal anion gap metabolic acidosis 94/50/3888   Hypotension 11/07/2020   Chest pain 10/31/2020   Acute on chronic HFrEF (heart failure with reduced ejection fraction) ([place])    Goals of care, counseling/discussion    Palliative care by specialist    DNR (do not resuscitate)    Critical limb ischemia with history of revascularization of same extremity ([place]) 10/30/2020   Hyperglycemia 10/30/2020   Tobacco abuse 10/30/2020   Leukocytosis 10/30/2020   Chronic combined systolic and diastolic CHF (congestive heart failure) ([place]) 10/30/2020   Ischemic cardiomyopathy 10/24/2020   AKI (acute  kidney injury) ([place]) 10/17/2020   PAD (peripheral artery disease) ([place]) 10/16/2020   Critical lower limb ischemia ([place]) 10/05/2020   Rest pain of left upper extremity due to atherosclerosis ([place]) 10/05/2020   Severe claudication ([place]) 09/20/2020   Paresthesia of both lower extremities 09/20/2020   H/O cardiac arrest 09/20/2020   H/O acute myocardial infarction 08/14/2020   Snoring 08/14/2020   Sinus pause 08/14/2020   Mixed hyperlipidemia 08/14/2020   Tobacco dependence 08/14/2020   Coronary artery disease involving native coronary artery of native heart without angina pectoris 08/10/2020   HFrEF (heart failure with reduced ejection fraction) ([place]) 08/10/2020   Nonrheumatic mitral valve regurgitation 08/10/2020   STEMI (ST elevation myocardial infarction) ([place]) 08/02/2020   Accelerated hypertension 08/01/2020    Jamey Reas, PT, DPT 05/29/2022, 2:21 PM  [facility] [address] [place], [place], [address] Phone: [phone]   Fax:  [phone]  Name: Mike [name] MRN: [id number] Date of Birth: 1961-07-26

## 2022-06-03 ENCOUNTER — Encounter: Payer: BC Managed Care – PPO | Admitting: Physical Therapy

## 2022-06-10 ENCOUNTER — Encounter: Payer: Self-pay | Admitting: Physical Therapy

## 2022-06-10 ENCOUNTER — Ambulatory Visit (INDEPENDENT_AMBULATORY_CARE_PROVIDER_SITE_OTHER): Payer: BC Managed Care – PPO | Admitting: Physical Therapy

## 2022-06-10 DIAGNOSIS — R293 Abnormal posture: Secondary | ICD-10-CM | POA: Diagnosis not present

## 2022-06-10 DIAGNOSIS — R2689 Other abnormalities of gait and mobility: Secondary | ICD-10-CM

## 2022-06-10 DIAGNOSIS — M6281 Muscle weakness (generalized): Secondary | ICD-10-CM | POA: Diagnosis not present

## 2022-06-10 DIAGNOSIS — M25652 Stiffness of left hip, not elsewhere classified: Secondary | ICD-10-CM

## 2022-06-10 DIAGNOSIS — M79605 Pain in left leg: Secondary | ICD-10-CM

## 2022-06-10 DIAGNOSIS — R2681 Unsteadiness on feet: Secondary | ICD-10-CM | POA: Diagnosis not present

## 2022-06-10 DIAGNOSIS — G546 Phantom limb syndrome with pain: Secondary | ICD-10-CM

## 2022-06-10 NOTE — Therapy (Signed)
[facility] [place], [place], [address] Phone: [phone]   Fax:  [phone]  Physical Therapy Treatment & Recertification  Patient Details  Name: Mike [name] MRN: [id number] Date of Birth: [birth year] Referring Provider (PT): Meridee Score, MD   Encounter Date: 06/10/2022   PT End of Session - 06/10/22 1148     Visit Number 41    Number of Visits 4    Date for PT Re-Evaluation 06/12/22    Authorization Type BCBS    Authorization Time Period BCBS  Active Policy  Policy Effective  56/38/9373 - 12/28/9998  20% COINSURANCE 12/29/2021 - Open-ended Deductible ($1,000.00)  $0.00 Paid $1,000.00 to go Out-of-Pocket Limit ($5,050.00)  $0.00 Paid $5,050.00 to go    PT Start Time 1146    PT Stop Time 1231    PT Time Calculation (min) 45 min    Equipment Utilized During Treatment Gait belt    Activity Tolerance Patient tolerated treatment well;Patient limited by pain    Behavior During Therapy [facility] for tasks assessed/performed             Past Medical History:  Diagnosis Date   Anxiety    CAD (coronary artery disease)    Cryptogenic stroke ([place]) 05/10/2021   Encounter for loop recorder check 07/26/2021   Heart attack ([place]) 08/01/2020   Heart failure with reduced ejection fraction ([place])    Hyperlipidemia    Hypertension    Loop [employer] [id number] 05/10/2021 05/10/2021   PAD (peripheral artery disease) ([place])    right CIA & right distal iliac artery stents 10/16/20; left AKA 11/06/20   PFO (patent foramen ovale) 12/14/2020   Pneumonia    Pulmonary AV (arteriovenous) fistula ([place]) 12/14/2020   Stroke ([place]) 12/11/2020   no residuial effects    Past Surgical History:  Procedure Laterality Date   ABDOMINAL AORTOGRAM W/LOWER EXTREMITY N/A 10/16/2020   Procedure: ABDOMINAL AORTOGRAM W/LOWER EXTREMITY;  Surgeon: Nigel Mormon, MD;  Location: [place] [facility];  Service: Cardiovascular;  Laterality: N/A;   AMPUTATION Left  11/06/2020   Procedure: AMPUTATION ABOVE KNEE;  Surgeon: Rosetta Posner, MD;  Location: [place];  Service: Vascular;  Laterality: Left;   BUBBLE STUDY  12/14/2020   Procedure: BUBBLE STUDY;  Surgeon: Nigel Mormon, MD;  Location: [place];  Service: Cardiovascular;;   CARDIAC CATHETERIZATION     CORONARY STENT INTERVENTION N/A 08/03/2020   Procedure: CORONARY STENT INTERVENTION;  Surgeon: Nigel Mormon, MD;  Location: [place] [facility];  Service: Cardiovascular;  Laterality: N/A;   CORONARY/GRAFT ACUTE MI REVASCULARIZATION N/A 08/01/2020   Procedure: Coronary/Graft Acute MI Revascularization;  Surgeon: Nigel Mormon, MD;  Location: [place] [facility];  Service: Cardiovascular;  Laterality: N/A;   INTRAVASCULAR LITHOTRIPSY Right 10/16/2020   Procedure: INTRAVASCULAR LITHOTRIPSY;  Surgeon: Nigel Mormon, MD;  Location: [place] [facility];  Service: Cardiovascular;  Laterality: Right;  Common and External Iliac   IR ANGIOGRAM PULMONARY LEFT SELECTIVE  03/20/2021   IR ANGIOGRAM SELECTIVE EACH ADDITIONAL VESSEL  03/20/2021   IR EMBO ARTERIAL NOT HEMORR HEMANG INC GUIDE ROADMAPPING  03/20/2021   IR RADIOLOGIST EVAL & MGMT  01/30/2021   IR RADIOLOGIST EVAL & MGMT  04/18/2021   IR RADIOLOGIST EVAL & MGMT  07/25/2021   IR US GUIDE VASC ACCESS RIGHT  03/20/2021   LEFT HEART CATH AND CORONARY ANGIOGRAPHY N/A 08/01/2020   Procedure: LEFT HEART CATH AND CORONARY ANGIOGRAPHY;  Surgeon: Nigel Mormon, MD;  Location:  [place] [facility];  Service: Cardiovascular;  Laterality: N/A;   LEFT HEART CATH AND CORONARY ANGIOGRAPHY N/A 08/03/2020   Procedure: LEFT HEART CATH AND CORONARY ANGIOGRAPHY;  Surgeon: Nigel Mormon, MD;  Location: [place] [facility];  Service: Cardiovascular;  Laterality: N/A;   PERIPHERAL VASCULAR INTERVENTION Right 10/16/2020   Procedure: PERIPHERAL VASCULAR INTERVENTION;  Surgeon: Nigel Mormon, MD;  Location: [place] [facility];  Service: Cardiovascular;   Laterality: Right;  Common and Iliac Stent   RADIOLOGY WITH ANESTHESIA N/A 03/20/2021   Procedure: PULMONARY EMBOLIZATION;  Surgeon: Aletta Edouard, MD;  Location: [place];  Service: Radiology;  Laterality: N/A;   TEE WITHOUT CARDIOVERSION N/A 12/14/2020   Procedure: TRANSESOPHAGEAL ECHOCARDIOGRAM (TEE);  Surgeon: Nigel Mormon, MD;  Location: [facility];  Service: Cardiovascular;  Laterality: N/A;    There were no vitals filed for this visit.   Subjective Assessment - 06/10/22 1145     Subjective He had fever last week and wore prosthesis during awake hours.  He would like another month of PT to improve to enable to walk in apt without device some.  He has been walking with one crutch in community carrying 2nd crutch unless he encounters a ramp/incline.  He is doing some in kitchen without device.    Pertinent History PAD, gangrene s/p L AKA 11/06/20, HTN, CAD s/p STEMI with stenting 08/17/2020, and CHF. cryptogenic stroke Dec 2021    Patient Stated Goals to return to work, (he was released from current job over last weekend but wants to find job).  to walk in community,    Currently in Pain? Yes   ranging 1-6.5/10   Pain Location Leg   residual limb   Pain Orientation Left;Distal    Pain Descriptors / Indicators Discomfort;Contraction    Pain Type Chronic pain    Pain Onset More than a month ago    Aggravating Factors  maybe overuse the day before, but only stays at 6.5 for couple of hours.    Pain Relieving Factors Rx meds & tylenol    Pain Onset More than a month ago                [facility] PT Assessment - 06/10/22 1145       Assessment   Medical Diagnosis Left Transfemoral Amputation    Referring Provider (PT) Meridee Score, MD    Onset Date/Surgical Date 05/21/21      Transfers   Transfers Sit to Stand;Stand to Sit    Sit to Stand 6: Modified independent (Device/Increase time);With upper extremity assist;From chair/3-in-1    Stand to Sit 6: Modified independent  (Device/Increase time);With upper extremity assist;To chair/3-in-1      Ambulation/Gait   Ambulation/Gait Yes    Ambulation/Gait Assistance 5: Supervision;6: Modified independent (Device/Increase time)   supervision with cane or no device, modified independent with 2 crutches.   Ambulation Distance (Feet) 500 Feet   >500' with 2 crutches, 256' max tolerable with cane, 25' without device   Assistive device Prosthesis;None;Straight cane;Lofstrands;R Forearm Crutch    Gait Pattern Step-through pattern;Decreased arm swing - left;Decreased step length - right;Decreased stance time - left;Abducted - left    Ambulation Surface Level;Indoor    Gait velocity 2.40 ft/sec comfortable pace with cane & 2.89 ft/sec fast pace with cane.   03/18/2022 was 2.09 ft/sec comfortable pace & 2.52 ft/sec fast pace with cane; 08/07/21 was with 2 crutches 1.37 ft/sec comfortable pace & 1.45 ft/sec fast pace and 0.97 ft/sec comfortable  pace with 1 crutch.   Ramp 5: Supervision;6: Modified independent (Device)   supervision / verbal cues with cane & TFA prosthesis & modified independent with 2 crutches   Curb 5: Supervision;6: Modified independent (Device/increase time)   supervision / verbal cues with cane & TFA prosthesis & modified independent with 2 crutches   Gait Comments pt ambulated max tolerable distance without rests 256' in 2 min 15 sec with cane support.  Resting HR 66 & post HR 118.      Dynamic Gait Index   Level Surface Mild Impairment   with cane   Change in Gait Speed Mild Impairment   with cane   Gait with Horizontal Head Turns Mild Impairment   with cane   Gait with Vertical Head Turns Mild Impairment   with cane   Gait and Pivot Turn Mild Impairment   with cane   Step Over Obstacle Moderate Impairment   with cane   Step Around Obstacles Mild Impairment   with cane   Steps Moderate Impairment   with cane & rail   Total Score 14    DGI comment: on 08/07/22 12/24 with 2 crutches and 0/24 with single  crutch;  today, 03/18/22 with cane 9/24      Timed Up and Go Test   Normal TUG (seconds) 16.75   today, 06/10/2022 is 16.75sec with cane, 16.94sec without device with supervision.  05/13/2022  17.12sec cane  03/18/22 21.53sec cane, was 08/07/2021 was 49.38sec w/right crutch, today 27.54sec w/right crutch & 33.53sec without device except prosthesis minA            Prosthetics Assessment - 06/10/22 1145       Prosthetics   Donning prosthesis  Modified independent (Device/Increase time)    Doffing prosthesis  Modified independent (Device/Increase time)    Current prosthetic wear tolerance (days/week)  daily    Current prosthetic wear tolerance (#hours/day)  11-15.5 hours which is 70% - 95% of awake hours.  see subjective for pain which is slowly decreasing as pt increases wear & activities    Residual limb condition  reports no issues                                     PT Short Term Goals - 06/10/22 1521       PT SHORT TERM GOAL #1   Title Patient verbalizes understanding of updated HEP / prosthetic activities.    Time 4    Period Weeks    Status New    Target Date 07/08/22      PT SHORT TERM GOAL #2   Title patient ambulates 300' with cane & prosthesis with supervision.    Time 4    Period Weeks    Status New    Target Date 07/08/22      PT SHORT TERM GOAL #3   Title Patient reports prosthesis wear 12-16 hours per day with limb pain </= 4/10    Time 4    Period Weeks    Status New    Target Date 07/08/22               PT Long Term Goals - 06/10/22 1249       PT LONG TERM GOAL #1   Title Patient tolerates wear >90% of awake hours without skin issues and limb pain <2/10.    Baseline 06/10/2022 progressing with wear 70-95% of awake  hours with limb pain 1/10 to 6.5/10 but only for couple of hours.    Time 8   visits   Period --    Status On-going    Target Date 10/14/22      PT LONG TERM GOAL #2   Title Timed Up & Go standard with cane  and prosthesis < 13.5sec and cognitive TUG <15.5sec.  TUG with prosthesis only <25sec.    Baseline 06/10/2022 improved TUG to 16.94 sec with prosthesis only with supervision.    Time 8   visits   Period --    Status On-going    Target Date 10/14/22      PT LONG TERM GOAL #3   Title dynamic gait index with prosthesis & cane >/= 16/24.    Baseline 06/10/2022 improved DGI to 14/24 with cane    Time 8   visits   Period --    Status On-going    Target Date 10/14/22      PT LONG TERM GOAL #4   Title Patient ambulates 500' with cane & prosthesis modified independent.    Baseline 06/10/2022 improved to ambulate 256' with cane with supervision before fatigues.    Time 8   visits   Period --    Status On-going    Target Date 10/14/22      PT LONG TERM GOAL #5   Title Patient negotiates ramps, curbs & stairs single rail with cane & prosthesis modified independent.    Baseline progressing 06/10/2022 he requires supervision with cane to neg ramps, curbs.  stairs step to pattern with cane & rail safely.    Time 8   visits   Period --    Status On-going    Target Date 10/14/22      PT LONG TERM GOAL #6   Title Patient ambulates 100' carrying <5# item with prosthesis only modified independent.    Baseline 06/10/2022 he ambulates 25' without device with close supervision.    Time 8   visits   Period --    Status On-going    Target Date 10/14/22                   Plan - 06/10/22 1257     Clinical Impression Statement Patient is slowly improving his functional mobility & safety with his prosthesis.   Limb pain is improving but continues to limit his mobility.  PT is instructing pt in activities to work on outside of PT including how to challenge but remain safe.  He would benefit from additional PT to further improve his mobility & safety.    Personal Factors and Comorbidities Comorbidity 3+;Fitness;Time since onset of injury/illness/exacerbation    Comorbidities PAD, gangrene s/p L AKA  11/06/20, HTN, CAD s/p STEMI with stenting 08/17/2020, and CHF. cryptogenic stroke Dec 2021    Examination-Activity Limitations Lift;Locomotion Level;Squat;Stairs;Stand;Transfers;Carry    Examination-Participation Restrictions Community Activity;Occupation    Stability/Clinical Decision Making Evolving/Moderate complexity    Rehab Potential Good    PT Frequency Biweekly    PT Duration --   8 visits   PT Treatment/Interventions ADLs/Self Care Home Management;DME Instruction;Gait training;Stair training;Functional mobility training;Therapeutic activities;Therapeutic exercise;Balance training;Neuromuscular re-education;Patient/family education;Prosthetic Training;Manual techniques;Passive range of motion;Ultrasound;Vestibular;Other (comment)   dry needling & Physical Performance Testing   PT Next Visit Plan 1x every other week with instruction for progressing activities outside of PT,  instruct in wrapping limb for night to determine if controls limb pain, work towards updated STGs    Consulted and  Agree with Plan of Care Patient             Patient will benefit from skilled therapeutic intervention in order to improve the following deficits and impairments:  Decreased activity tolerance, Decreased balance, Decreased endurance, Decreased knowledge of use of DME, Decreased mobility, Decreased range of motion, Decreased strength, Increased edema, Postural dysfunction, Prosthetic Dependency, Pain, Abnormal gait  Visit Diagnosis: Other abnormalities of gait and mobility  Muscle weakness (generalized)  Unsteadiness on feet  Abnormal posture  Stiffness of left hip, not elsewhere classified  Pain in left leg  Phantom limb syndrome with pain [facility])     Problem List Patient Active Problem List   Diagnosis Date Noted   Encounter for loop recorder check 07/26/2021   Cryptogenic stroke ([place]) 05/10/2021   Loop [employer] [id number] 05/10/2021 05/10/2021   AVM (arteriovenous malformation)  03/20/2021   Pulmonary AV (arteriovenous) fistula ([place]) 02/20/2021   Pre-ulcerative calluses 12/20/2020   Cerebral thrombosis with cerebral infarction 12/11/2020   Cerebrovascular accident (CVA) due to embolism of cerebral artery ([place]) 12/11/2020   Pressure injury of skin 11/12/2020   Acute urinary retention 11/09/2020   Normal anion gap metabolic acidosis 59/74/1638   Hypotension 11/07/2020   Chest pain 10/31/2020   Acute on chronic HFrEF (heart failure with reduced ejection fraction) ([place])    Goals of care, counseling/discussion    Palliative care by specialist    DNR (do not resuscitate)    Critical limb ischemia with history of revascularization of same extremity ([place]) 10/30/2020   Hyperglycemia 10/30/2020   Tobacco abuse 10/30/2020   Leukocytosis 10/30/2020   Chronic combined systolic and diastolic CHF (congestive heart failure) ([place]) 10/30/2020   Ischemic cardiomyopathy 10/24/2020   AKI (acute kidney injury) ([place]) 10/17/2020   PAD (peripheral artery disease) ([place]) 10/16/2020   Critical lower limb ischemia ([place]) 10/05/2020   Rest pain of left upper extremity due to atherosclerosis ([place]) 10/05/2020   Severe claudication ([place]) 09/20/2020   Paresthesia of both lower extremities 09/20/2020   H/O cardiac arrest 09/20/2020   H/O acute myocardial infarction 08/14/2020   Snoring 08/14/2020   Sinus pause 08/14/2020   Mixed hyperlipidemia 08/14/2020   Tobacco dependence 08/14/2020   Coronary artery disease involving native coronary artery of native heart without angina pectoris 08/10/2020   HFrEF (heart failure with reduced ejection fraction) ([place]) 08/10/2020   Nonrheumatic mitral valve regurgitation 08/10/2020   STEMI (ST elevation myocardial infarction) ([place]) 08/02/2020   Accelerated hypertension 08/01/2020    Jamey Reas, PT, DPT 06/10/2022, 3:28 PM  [facility] [address] [place], [place], [address] Phone: [phone]   Fax:   [phone]  Name: KENAI FLUEGEL MRN: [id number] Date of Birth: 11/23/1961

## 2022-06-16 ENCOUNTER — Other Ambulatory Visit: Payer: BC Managed Care – PPO

## 2022-06-16 DIAGNOSIS — Z95818 Presence of other cardiac implants and grafts: Secondary | ICD-10-CM | POA: Diagnosis not present

## 2022-06-16 DIAGNOSIS — Z4509 Encounter for adjustment and management of other cardiac device: Secondary | ICD-10-CM | POA: Diagnosis not present

## 2022-06-16 DIAGNOSIS — I5020 Unspecified systolic (congestive) heart failure: Secondary | ICD-10-CM | POA: Diagnosis not present

## 2022-06-23 ENCOUNTER — Other Ambulatory Visit: Payer: Self-pay

## 2022-06-24 ENCOUNTER — Encounter: Payer: Self-pay | Admitting: Physical Therapy

## 2022-06-24 ENCOUNTER — Ambulatory Visit (INDEPENDENT_AMBULATORY_CARE_PROVIDER_SITE_OTHER): Payer: BC Managed Care – PPO | Admitting: Physical Therapy

## 2022-06-24 DIAGNOSIS — R2689 Other abnormalities of gait and mobility: Secondary | ICD-10-CM

## 2022-06-24 DIAGNOSIS — R2681 Unsteadiness on feet: Secondary | ICD-10-CM | POA: Diagnosis not present

## 2022-06-24 DIAGNOSIS — R293 Abnormal posture: Secondary | ICD-10-CM | POA: Diagnosis not present

## 2022-06-24 DIAGNOSIS — M6281 Muscle weakness (generalized): Secondary | ICD-10-CM | POA: Diagnosis not present

## 2022-06-24 DIAGNOSIS — M79605 Pain in left leg: Secondary | ICD-10-CM

## 2022-06-24 DIAGNOSIS — G546 Phantom limb syndrome with pain: Secondary | ICD-10-CM

## 2022-06-25 ENCOUNTER — Ambulatory Visit: Payer: BC Managed Care – PPO

## 2022-06-25 ENCOUNTER — Ambulatory Visit: Payer: BC Managed Care – PPO | Admitting: Cardiology

## 2022-06-25 DIAGNOSIS — I2510 Atherosclerotic heart disease of native coronary artery without angina pectoris: Secondary | ICD-10-CM | POA: Diagnosis not present

## 2022-06-25 DIAGNOSIS — I5020 Unspecified systolic (congestive) heart failure: Secondary | ICD-10-CM

## 2022-07-03 ENCOUNTER — Encounter: Payer: Self-pay | Admitting: Cardiology

## 2022-07-03 ENCOUNTER — Ambulatory Visit: Payer: BC Managed Care – PPO | Admitting: Cardiology

## 2022-07-03 VITALS — BP 125/74 | HR 62 | Temp 98.0°F | Resp 16 | Ht 72.0 in | Wt 198.0 lb

## 2022-07-03 DIAGNOSIS — I739 Peripheral vascular disease, unspecified: Secondary | ICD-10-CM

## 2022-07-03 DIAGNOSIS — I5020 Unspecified systolic (congestive) heart failure: Secondary | ICD-10-CM | POA: Diagnosis not present

## 2022-07-03 DIAGNOSIS — I2510 Atherosclerotic heart disease of native coronary artery without angina pectoris: Secondary | ICD-10-CM

## 2022-07-03 DIAGNOSIS — I252 Old myocardial infarction: Secondary | ICD-10-CM

## 2022-07-03 DIAGNOSIS — I255 Ischemic cardiomyopathy: Secondary | ICD-10-CM | POA: Diagnosis not present

## 2022-07-03 MED ORDER — DAPAGLIFLOZIN PROPANEDIOL 10 MG PO TABS: 10.0000 mg | ORAL_TABLET | Freq: Every day | ORAL | 2 refills | Status: DC

## 2022-07-03 MED ORDER — NITROGLYCERIN 0.4 MG SL SUBL: 0.4000 mg | SUBLINGUAL_TABLET | SUBLINGUAL | 1 refills | Status: DC | PRN

## 2022-07-03 NOTE — Progress Notes (Signed)
Follow up visit  Subjective:   Mike [name], male    DOB: 07-04-1961, 61 y.o.   MRN: [id number]    HPI   Chief Complaint  Patient presents with   Coronary Artery Disease   PAD   Follow-up    61 y.o. Caucasian male with CAD, culprit (RCA) and nonculprit (LCx) PCI after STEMI 07/2020, HFrEF, moderate MR, h/o cardiac arrest 2/2 Torsades post PCI during index hospitalization, PAD with critical limb ischemia s/p Rt iliac revascularization, and s/p Lt AKA amputation for critical limb ischemia,  severe bilateral renal artery stenoses w/h/o CIN (09/2020), multifocal stroke s/p tPA with resolution of neurodeficit (11/2020), large AV pulmonary fistula-s/p closure by Dr. Kathlene Cote (02/2021), small PFO.  Patient is doing well.  He has not had any episodes of palpitations, shortness of breath, leg edema, orthopnea.  He walks anywhere from 2000 10,000 steps, using his prosthetic foot and left leg.  Physical activity is down last few weeks due to the heat.  Reviewed recent test results with the patient, details below.    Current Outpatient Medications:    acetaminophen (TYLENOL) 500 MG tablet, Take 500-1,000 mg by mouth every 6 (six) hours as needed (pain)., Disp: , Rfl:    aspirin EC 81 MG tablet, Take 1 tablet (81 mg total) by mouth daily. Swallow whole., Disp: 30 tablet, Rfl: 0   DULoxetine (CYMBALTA) 30 MG capsule, TAKE 1 CAPSULE BY MOUTH EVERY DAY, Disp: 90 capsule, Rfl: 2   ezetimibe (ZETIA) 10 MG tablet, Take 1 tablet (10 mg total) by mouth daily., Disp: 90 tablet, Rfl: 3   furosemide (LASIX) 20 MG tablet, Take 1 tablet (20 mg total) by mouth daily., Disp: 90 tablet, Rfl: 1   gabapentin (NEURONTIN) 300 MG capsule, Take 1 capsule (300 mg total) by mouth 2 (two) times daily. (Patient taking differently: Take 300 mg by mouth in the morning and at bedtime.), Disp: 60 capsule, Rfl: 0   metoprolol succinate (TOPROL-XL) 25 MG 24 hr tablet, Take 1 tablet (25 mg total) by mouth daily. Take with or  immediately following a meal., Disp: 90 tablet, Rfl: 3   Multiple Vitamins-Minerals (MULTI FOR HIM 50+) TABS, Take 1 tablet by mouth See admin instructions., Disp: , Rfl:    nitroGLYCERIN (NITROSTAT) 0.4 MG SL tablet, Place 1 tablet (0.4 mg total) under the tongue every 5 (five) minutes as needed for chest pain. (Patient taking differently: Place 0.4 mg under the tongue every 5 (five) minutes as needed for chest pain (max 3 doses).), Disp: 30 tablet, Rfl: 1   rivaroxaban (XARELTO) 2.5 MG TABS tablet, Take 1 tablet (2.5 mg total) by mouth 2 (two) times daily., Disp: 180 tablet, Rfl: 3   rosuvastatin (CRESTOR) 40 MG tablet, Take 1 tablet (40 mg total) by mouth every evening., Disp: 90 tablet, Rfl: 3   sacubitril-valsartan (ENTRESTO) 49-51 MG, Take 1 tablet by mouth 2 (two) times daily., Disp: 180 tablet, Rfl: 3  Cardiovascular & other pertient studies:  EKG 07/03/2022: Sinus rhythm 65 bpm Old anterior and inferior infarct Poor R wave progression  Echocardiogram 06/25/2022:  Left ventricle cavity is severely dilated. Normal left ventricular wall  thickness. Moderate inferolateral hypokinesis, mild global hypokinesis.  LVEF 35-40%. Doppler evidence of grade I (impaired) diastolic dysfunction,  normal LAP.  Mild to moderate mitral regurgitation.  Mild tricuspid regurgitation. Estimated pulmonary artery systolic pressure  41 mmHg.  Previous study on 09/10/2021 noted mild LV dilatation, LVEF 40-45%, mod MR,  no PH.  Remote loop  recorder transmission 06/16/2022: Predominant rhythm is normal sinus rhythm.  Atrial monitoring episodes reveal PVCs and occasional ventricular couplets.  There was no atrial fibrillation, no asystole episodes since last transmission a month ago.     CTA Chest 12/14/2020: 1. Large 3.1 x 1.9 cm pulmonary arteriovenous fistula in the medial left lower lobe. 2. Right middle lobe 3 mm solid pulmonary nodule. Follow-up noncontrast chest CT recommended in 12 months in this high  risk patient.This recommendation follows the consensus statement: Guidelines for Management of Incidental Pulmonary Nodules Detected on CT Images: From the Fleischner Society 2017; Radiology 2017; 284:228-243. 3. Mild cardiomegaly. 4. Aortic Atherosclerosis (ICD10-I70.0) and Emphysema (ICD10-J43.9).    PV intervention 10/16/2020: Successful intravascular lithotripsy, PTCA and stenting     7.0X39 mm balloon expandable Viabahn VBX stent Rt common iliac artery      7.0X60 mm self expanding Absolute Pro stent Rt distal iliac artery   No named vessels at ankle level in both lower extremities.  Will consult vascular surgery for Rt fem-to-left profunda bypass given left foot critical limb ischemia.   Abdominal Aortic Duplex  09/21/2020:  Moderate plaque noted in the proximal, mid and distal aorta. There is an  ulcerated plaque noted in the distal abdominal aorta. No AAA. Normal iliac  artery velocity.   Lower Extremity Arterial Duplex 09/21/2020:  The right SFA is occluded in the proximal segment with reconstitution at  the level of the popliteal artery with diffuse monophasic waveform below  the knee. Right profunda femoral artery has >50% stenosis. There is  moderate mixed plaque noted throughout the right lower extremity.  Monophasic waveform throughout the left lower extremity, indicates  significant proximal disease (iliac artery).   Left SFA is occluded in the proximal segment and reconstitutes just above popliteal artery and diffuse dampened monophasic waveform throughout the lower extremity below the  knee. There is moderate mixed plaque throughout the left lower extremity.   This exam reveals severely decreased perfusion of the right lower  extremity, noted at the dorsalis pedis artery level (ABI 0.42) and  critically decreased perfusion of the left lower extremity, noted at the  dorsalis pedis and post tibial artery level (ABI 0.03).   Coronary intervention 08/03/2020: LM:  Distal 40% stenosis LAD: Mid 30% disease LCx: Subtotally occluded OM2, prox LCx 70% stenosis        Successful percutaneous coronary intervention OM2-Prox LCx        PTCA and overlapping stents placement         2.5 X 38 mm and 2.5 X 18 mm Resolute Onyx drug-eluting stents        100%--->0% stenosis. TIMI flow 0-->III        Small caliber distal vessel with moderate diffuse disease RCA: Prox 30% stenosis.         Patent mid RCA sttent 2.5 X 26 mm Resolute Onyx drug-eluting stent   LVEDP 42 mmHg   Coronary intervention 08/01/2020: LM: Distal 30% stenosis LAD: Mid 30% disease LCx: Subtotally occluded OM2, bridging left-to-left and right-to-left collaterals RCA: Prox 30% stenosis.        Mid 100% occlusion        Successful percutaneous coronary intervention mid RCA        PTCA and stent placement 2.5 X 26 mm Resolute Onyx drug-eluting stent 100%--->0% stenosis. TIMI flow 0-->III     Recent labs: 12/06/2021: Chol 127, TG 215, HDL 37, LDL 55  09/04/2021: Glucose 90, BUN/Cr 19/1.21. EGFR 68. Na/K 142/4.1.  Chol 150,  TG 203, HDL 41, LDL 75  03/20/2021: Glucose 85, BUN/Cr 19/1.05. EGFR >60. Na/K 139/3.8. Rest of the CMP normal H/H 13/42. MCV 84. Platelets 212   01/31/2021: Glucose 118, BUN/Cr 22/1.0. EGFR 81. Na/K 145/3.8.   12/14/2020: Glucose 100, BUN/Cr 13/1.09. EGFR >60. Na/K 140/3.9.  H/H 11.6/35.5. MCV 90.6. Platelets 202 HbA1C 4.6% Chol 130, TG 71, HDL 42, LDL 74  09/25/2020: Glucose 102, BUN/Cr 18/1.16. EGFR 68. Na/K 140/4.5.  NT pro BNP 2404  09/11/2020: Glucose 114, BUN/Cr 19/1.42. EGFR 53. Na/K 136/4.7.  Chol 95, TG 76, HDL 30, LDL 49  08/04/2020: Glucose 101, BUN/Cr 19/1.08. EGFR >60. Na/K 139/4.0. Rest of the CMP normal H/H 14/42. MCV 89. Platelets 184. WBC count 14k HbA1C 5.8% Chol 178, TG 75, HDL 40, LDL 123    Review of Systems  Cardiovascular:  Negative for chest pain, dyspnea on exertion, leg swelling, palpitations and syncope.  Musculoskeletal:         Left leg phantom pain          Vitals:   07/03/22 0839 07/03/22 0844  BP: 140/75 125/74  Pulse: 70 62  Resp: 16   Temp: 98 F (36.7 C)   SpO2: 96%      Body mass index is 26.85 kg/m. Filed Weights   07/03/22 0839  Weight: 198 lb (89.8 kg)     Objective:   Physical Exam Neck:     Vascular: No JVD.  Cardiovascular:     Rate and Rhythm: Normal rate and regular rhythm.     Pulses:          Dorsalis pedis pulses are 0 on the right side.       Posterior tibial pulses are 0 on the right side.     Heart sounds: No murmur heard. Musculoskeletal:     Right lower leg: No edema.     Comments: S/p left AKA Now has prosthesis           Assessment & Recommendations:   61 y.o. Caucasian male with CAD, culprit (RCA) and nonculprit (LCx) PCI after STEMI 07/2020, HFrEF, moderate MR, h/o cardiac arrest 2/2 Torsades post PCI during index hospitalization, PAD with critical limb ischemia s/p Rt iliac revascularization, and s/p Lt AKA amputation for critical limb ischemia,  severe bilateral renal artery stenoses w/h/o CIN (09/2020), stroke s/p tPA with resolution of neurodeficit (11/2020), multifocal stroke on MRI suspicious for cardioembolic source, small PFO/secundum ASD and large pulmonary AV fistula now s/p closure by Dr. Kathlene Cote (02/2021), ILR placement (04/2021), asymptomatic sinus pauses  HFrEF, secondary mitral regurgitation: Severely dilated LV, EF 35-40% (05/2022), mild PH Currently on Entresto 24-26 mg bid, metoprolol succinate 25 mg daily. Clinically, he is euvolemic and not in heart failure.  Ideally, I would like to increase his GDMT for heart failure.  Given that he is in NYHA I, known to have bilateral renal stenosis and AKI in the past, I will be cautious with uptitrating his medical therapy. Added Farxiga 10 mg daily today.  Encouraged to stay well-hydrated. Check BMP, and proBNP in 2 weeks. Follow-up in 4 weeks.  Asymptomatic sinus pauses: Likely due to OSA. He is  not interested in sleep testing. No recurrence.  Stroke: Multifocal stroke with small PFO/secundum ASD but large pulmonary AV fistula (11/2020), now s/p closure by Dr. Kathlene Cote (02/2021) Repeat TCD bubble study as pr Dr. Clydene Fake recommendations S/p ILR placement (04/2021). No Afib detected thus far  PAD: Successful revascularization of right common iliac and right external iliac  artery (09/2020) S/p Lt AKA.  No critical limb ischemia, although he has pre-ulcerative calloses on right sole.  Continue Aspirin 81 mg daily and Xarelto 2.5 mg bid.  CAD: S/p STEMI 07/2020. Currently no angina symptoms Continue aspirin 81 mg daily and Xarelto 2.5 mg bid. Continue Crestor 40 mg, Zetia 10 mg daily.  Check lipid panel in 2 weeks.   F/u in 4 weeks   Nigel Mormon, MD Pager: [phone] Office: [phone]

## 2022-07-08 ENCOUNTER — Encounter: Payer: BC Managed Care – PPO | Admitting: Physical Therapy

## 2022-07-17 DIAGNOSIS — I5020 Unspecified systolic (congestive) heart failure: Secondary | ICD-10-CM | POA: Diagnosis not present

## 2022-07-17 DIAGNOSIS — Z95818 Presence of other cardiac implants and grafts: Secondary | ICD-10-CM | POA: Diagnosis not present

## 2022-07-17 DIAGNOSIS — I2510 Atherosclerotic heart disease of native coronary artery without angina pectoris: Secondary | ICD-10-CM | POA: Diagnosis not present

## 2022-07-17 DIAGNOSIS — Z4509 Encounter for adjustment and management of other cardiac device: Secondary | ICD-10-CM | POA: Diagnosis not present

## 2022-07-18 LAB — LIPID PANEL
Chol/HDL Ratio: 3.4 ratio (ref 0.0–5.0)
Cholesterol, Total: 113 mg/dL (ref 100–199)
HDL: 33 mg/dL — ABNORMAL LOW (ref >39)
LDL Chol Calc (NIH): 38 mg/dL (ref 0–99)
Triglycerides: 278 mg/dL — ABNORMAL HIGH (ref 0–149)
VLDL Cholesterol Cal: 42 mg/dL — ABNORMAL HIGH (ref 5–40)

## 2022-07-18 LAB — BASIC METABOLIC PANEL
BUN/Creatinine Ratio: 18 (ref 10–24)
BUN: 23 mg/dL (ref 8–27)
CO2: 13 mmol/L — ABNORMAL LOW (ref 20–29)
Calcium: 8.8 mg/dL (ref 8.6–10.2)
Chloride: 105 mmol/L (ref 96–106)
Creatinine, Ser: 1.28 mg/dL — ABNORMAL HIGH (ref 0.76–1.27)
Glucose: 89 mg/dL (ref 70–99)
Potassium: 4.5 mmol/L (ref 3.5–5.2)
Sodium: 141 mmol/L (ref 134–144)
eGFR: 63 mL/min/{1.73_m2} (ref >59)

## 2022-07-18 LAB — PRO B NATRIURETIC PEPTIDE: NT-Pro BNP: 964 pg/mL — ABNORMAL HIGH (ref 0–210)

## 2022-07-22 ENCOUNTER — Other Ambulatory Visit: Payer: Self-pay

## 2022-07-22 ENCOUNTER — Ambulatory Visit (INDEPENDENT_AMBULATORY_CARE_PROVIDER_SITE_OTHER): Payer: BC Managed Care – PPO | Admitting: Physical Therapy

## 2022-07-22 ENCOUNTER — Encounter: Payer: Self-pay | Admitting: Physical Therapy

## 2022-07-22 DIAGNOSIS — R2681 Unsteadiness on feet: Secondary | ICD-10-CM | POA: Diagnosis not present

## 2022-07-22 DIAGNOSIS — R293 Abnormal posture: Secondary | ICD-10-CM

## 2022-07-22 DIAGNOSIS — G546 Phantom limb syndrome with pain: Secondary | ICD-10-CM

## 2022-07-22 DIAGNOSIS — R2689 Other abnormalities of gait and mobility: Secondary | ICD-10-CM

## 2022-07-22 DIAGNOSIS — M25652 Stiffness of left hip, not elsewhere classified: Secondary | ICD-10-CM

## 2022-07-22 DIAGNOSIS — M6281 Muscle weakness (generalized): Secondary | ICD-10-CM

## 2022-07-22 DIAGNOSIS — M79605 Pain in left leg: Secondary | ICD-10-CM

## 2022-07-22 MED ORDER — ASPIRIN 81 MG PO TBEC: 81.0000 mg | DELAYED_RELEASE_TABLET | Freq: Every day | ORAL | 3 refills | Status: DC

## 2022-07-22 NOTE — Therapy (Signed)
[facility] [place], [place], [address] Phone: [phone]   Fax:  [phone]  Physical Therapy Treatment  Patient Details  Name: Mike [name] MRN: [id number] Date of Birth: 1961/03/09 Referring Provider (PT): Meridee Score, MD   Encounter Date: 07/22/2022   PT End of Session - 07/22/22 1149     Visit Number 43    Number of Visits 62    Date for PT Re-Evaluation 06/12/22    Authorization Type BCBS    Authorization Time Period [employer]  Active Policy  Policy Effective  75/64/3329 - 12/28/9998  20% COINSURANCE 12/29/2021 - Open-ended Deductible ($1,000.00)  $0.00 Paid $1,000.00 to go Out-of-Pocket Limit ($5,050.00)  $0.00 Paid $5,050.00 to go    PT Start Time 1146    PT Stop Time 1230    PT Time Calculation (min) 44 min    Equipment Utilized During Treatment Gait belt    Activity Tolerance Patient tolerated treatment well;Patient limited by pain    Behavior During Therapy [facility] for tasks assessed/performed             Past Medical History:  Diagnosis Date   Anxiety    CAD (coronary artery disease)    Cryptogenic stroke ([place]) 05/10/2021   Encounter for loop recorder check 07/26/2021   Heart attack ([place]) 08/01/2020   Heart failure with reduced ejection fraction ([place])    Hyperlipidemia    Hypertension    Loop [employer] [id number] 05/10/2021 05/10/2021   PAD (peripheral artery disease) ([place])    right CIA & right distal iliac artery stents 10/16/20; left AKA 11/06/20   PFO (patent foramen ovale) 12/14/2020   Pneumonia    Pulmonary AV (arteriovenous) fistula ([place]) 12/14/2020   Stroke ([place]) 12/11/2020   no residuial effects    Past Surgical History:  Procedure Laterality Date   ABDOMINAL AORTOGRAM W/LOWER EXTREMITY N/A 10/16/2020   Procedure: ABDOMINAL AORTOGRAM W/LOWER EXTREMITY;  Surgeon: Nigel Mormon, MD;  Location: [facility] [facility];  Service: Cardiovascular;  Laterality: N/A;   AMPUTATION Left 11/06/2020    Procedure: AMPUTATION ABOVE KNEE;  Surgeon: Rosetta Posner, MD;  Location: [facility];  Service: Vascular;  Laterality: Left;   BUBBLE STUDY  12/14/2020   Procedure: BUBBLE STUDY;  Surgeon: Nigel Mormon, MD;  Location: [facility];  Service: Cardiovascular;;   CARDIAC CATHETERIZATION     CORONARY STENT INTERVENTION N/A 08/03/2020   Procedure: CORONARY STENT INTERVENTION;  Surgeon: Nigel Mormon, MD;  Location: [place] [facility];  Service: Cardiovascular;  Laterality: N/A;   CORONARY/GRAFT ACUTE MI REVASCULARIZATION N/A 08/01/2020   Procedure: Coronary/Graft Acute MI Revascularization;  Surgeon: Nigel Mormon, MD;  Location: [facility];  Service: Cardiovascular;  Laterality: N/A;   INTRAVASCULAR LITHOTRIPSY Right 10/16/2020   Procedure: INTRAVASCULAR LITHOTRIPSY;  Surgeon: Nigel Mormon, MD;  Location: [facility];  Service: Cardiovascular;  Laterality: Right;  Common and External Iliac   IR ANGIOGRAM PULMONARY LEFT SELECTIVE  03/20/2021   IR ANGIOGRAM SELECTIVE EACH ADDITIONAL VESSEL  03/20/2021   IR EMBO ARTERIAL NOT HEMORR HEMANG INC GUIDE ROADMAPPING  03/20/2021   IR RADIOLOGIST EVAL & MGMT  01/30/2021   IR RADIOLOGIST EVAL & MGMT  04/18/2021   IR RADIOLOGIST EVAL & MGMT  07/25/2021   IR US GUIDE VASC ACCESS RIGHT  03/20/2021   LEFT HEART CATH AND CORONARY ANGIOGRAPHY N/A 08/01/2020   Procedure: LEFT HEART CATH AND CORONARY ANGIOGRAPHY;  Surgeon: Nigel Mormon, MD;  Location: [facility];  Service: Cardiovascular;  Laterality: N/A;   LEFT HEART CATH AND CORONARY ANGIOGRAPHY N/A 08/03/2020   Procedure: LEFT HEART CATH AND CORONARY ANGIOGRAPHY;  Surgeon: Nigel Mormon, MD;  Location: [facility];  Service: Cardiovascular;  Laterality: N/A;   PERIPHERAL VASCULAR INTERVENTION Right 10/16/2020   Procedure: PERIPHERAL VASCULAR INTERVENTION;  Surgeon: Nigel Mormon, MD;  Location: [place] [facility];  Service: Cardiovascular;  Laterality:  Right;  Common and Iliac Stent   RADIOLOGY WITH ANESTHESIA N/A 03/20/2021   Procedure: PULMONARY EMBOLIZATION;  Surgeon: Aletta Edouard, MD;  Location: [facility];  Service: Radiology;  Laterality: N/A;   TEE WITHOUT CARDIOVERSION N/A 12/14/2020   Procedure: TRANSESOPHAGEAL ECHOCARDIOGRAM (TEE);  Surgeon: Nigel Mormon, MD;  Location[facility];  Service: Cardiovascular;  Laterality: N/A;    There were no vitals filed for this visit.   Subjective Assessment - 07/22/22 1149     Subjective The pain has been more tolerable.  He is wearing prosthesis ~15 hours / day.  no falls.  He walks in home single crutch or cane, in kitchen no device, in community he walks with one but carries 2nd.  he can get out of house to car except puts down for curb.  In grocery store, regular cart for light few items and power cart for heavier items.    Pertinent History PAD, gangrene s/p L AKA 11/06/20, HTN, CAD s/p STEMI with stenting 08/17/2020, and CHF. cryptogenic stroke Dec 2021    Patient Stated Goals to return to work, (he was released from current job over last weekend but wants to find job).  to walk in community,    Currently in Pain? Yes    Pain Score 3    couple of hours,  4-5 days / wk,  3 tops but usually 1-2/10   Pain Location Leg   residual limb   Pain Orientation Left;Distal    Pain Descriptors / Indicators Discomfort;Tightness    Pain Type Chronic pain    Pain Onset More than a month ago    Pain Frequency Intermittent    Aggravating Factors  dehydration    Pain Relieving Factors Rx meds & tylenol    Pain Onset More than a month ago                               [facility] Adult PT Treatment/Exercise - 07/22/22 1146       Transfers   Transfers Sit to Stand;Stand to Sit    Sit to Stand 6: Modified independent (Device/Increase time);With upper extremity assist;From chair/3-in-1    Stand to Sit 6: Modified independent (Device/Increase time);With upper extremity assist;To  chair/3-in-1      Ambulation/Gait   Ambulation/Gait Yes    Ambulation/Gait Assistance 5: Supervision   cane & no device except TFA prosthesis   Ambulation/Gait Assistance Details pt amb 300' X 1 & 200' X 2 with cane safely with no losses of balance or instability noted.  pt amb 25' X 3 & 43' X 1 without device safely with no losses of balance or instability noted.    Ambulation Distance (Feet) 200 Feet    Assistive device Prosthesis;None;Straight cane    Gait Pattern --    Gait velocity --    Stairs Assistance 6: Modified independent (Device/Increase time)    Stairs Assistance Details (indicate cue type and reason) verbal cues to not use faceboard to lock knee descending.  Stair Management Technique One rail Right;With cane;Step to pattern;Forwards    Number of Stairs 11    Height of Stairs 7    Ramp 5: Supervision;6: Modified independent (Device)   cane & TFA prosthesis   Curb 5: Supervision   cane & TFA prosthesis   Gait Comments PT recommended amb with cane in community & carrying a crutch if he fatigues or gets in situation he feels need for extra support. PT also recommended ambulating in home without device. he may need to build frequency over time.  pt verbalized understanding and feels comfortable working to advance gait as noted.      High Level Balance   High Level Balance Activities Side stepping;Backward walking;Negotitating around obstacles;Other (comment)   carrying open bottle of water   High Level Balance Comments no device except TFA prosthesis, supervision.  PT demo & verbal cues on technique with prosthesis.      Prosthetics   Prosthetic Care Comments  PT recommended checking where he stands with OOP max for 2023 and having [facility] check what his co-pay would be for socket revision with suction ring suspension.    Current prosthetic wear tolerance (days/week)  daily    Current prosthetic wear tolerance (#hours/day)  >15 hours    Residual limb condition  reports  no issues    Education Provided Other (comment)   see prosthetic care comments   Person(s) Educated Patient    Education Method Explanation;Verbal cues    Education Method Verbalized understanding                       PT Short Term Goals - 07/22/22 1250       PT SHORT TERM GOAL #1   Title Patient verbalizes understanding of updated HEP / prosthetic activities.    Time 4    Period Weeks    Status Achieved    Target Date 07/08/22      PT SHORT TERM GOAL #2   Title patient ambulates 300' with cane & prosthesis with supervision.    Time 4    Period Weeks    Status Achieved    Target Date 07/08/22      PT SHORT TERM GOAL #3   Title Patient reports prosthesis wear 12-16 hours per day with limb pain </= 4/10    Time 4    Period Weeks    Status Achieved    Target Date 07/08/22               PT Long Term Goals - 06/10/22 1249       PT LONG TERM GOAL #1   Title Patient tolerates wear >90% of awake hours without skin issues and limb pain <2/10.    Baseline 06/10/2022 progressing with wear 70-95% of awake hours with limb pain 1/10 to 6.5/10 but only for couple of hours.    Time 8   visits   Period --    Status On-going    Target Date 10/14/22      PT LONG TERM GOAL #2   Title Timed Up & Go standard with cane and prosthesis < 13.5sec and cognitive TUG <15.5sec.  TUG with prosthesis only <25sec.    Baseline 06/10/2022 improved TUG to 16.94 sec with prosthesis only with supervision.    Time 8   visits   Period --    Status On-going    Target Date 10/14/22      PT LONG TERM GOAL #3  Title dynamic gait index with prosthesis & cane >/= 16/24.    Baseline 06/10/2022 improved DGI to 14/24 with cane    Time 8   visits   Period --    Status On-going    Target Date 10/14/22      PT LONG TERM GOAL #4   Title Patient ambulates 500' with cane & prosthesis modified independent.    Baseline 06/10/2022 improved to ambulate 256' with cane with supervision before  fatigues.    Time 8   visits   Period --    Status On-going    Target Date 10/14/22      PT LONG TERM GOAL #5   Title Patient negotiates ramps, curbs & stairs single rail with cane & prosthesis modified independent.    Baseline progressing 06/10/2022 he requires supervision with cane to neg ramps, curbs.  stairs step to pattern with cane & rail safely.    Time 8   visits   Period --    Status On-going    Target Date 10/14/22      PT LONG TERM GOAL #6   Title Patient ambulates 100' carrying <5# item with prosthesis only modified independent.    Baseline 06/10/2022 he ambulates 25' without device with close supervision.    Time 8   visits   Period --    Status On-going    Target Date 10/14/22                   Plan - 07/22/22 1251     Clinical Impression Statement Pt met all STGs.  He appears safe to ambulate in community at basic level with cane (carrying a crutch for use prn) and no device in home.  Pt appears to be progressing his mobility with his microprocessor prosthesis.    Personal Factors and Comorbidities Comorbidity 3+;Fitness;Time since onset of injury/illness/exacerbation    Comorbidities PAD, gangrene s/p L AKA 11/06/20, HTN, CAD s/p STEMI with stenting 08/17/2020, and CHF. cryptogenic stroke Dec 2021    Examination-Activity Limitations Lift;Locomotion Level;Squat;Stairs;Stand;Transfers;Carry    Examination-Participation Restrictions Community Activity;Occupation    Stability/Clinical Decision Making Evolving/Moderate complexity    Rehab Potential Good    PT Frequency Biweekly    PT Duration --   8 visits   PT Treatment/Interventions ADLs/Self Care Home Management;DME Instruction;Gait training;Stair training;Functional mobility training;Therapeutic activities;Therapeutic exercise;Balance training;Neuromuscular re-education;Patient/family education;Prosthetic Training;Manual techniques;Passive range of motion;Ultrasound;Vestibular;Other (comment)   dry needling &  Physical Performance Testing   PT Next Visit Plan set updated STGs, check how gait with cane in community & no device in home are going,  1x every other week with instruction for progressing activities outside of PT,    Consulted and Agree with Plan of Care Patient             Patient will benefit from skilled therapeutic intervention in order to improve the following deficits and impairments:  Decreased activity tolerance, Decreased balance, Decreased endurance, Decreased knowledge of use of DME, Decreased mobility, Decreased range of motion, Decreased strength, Increased edema, Postural dysfunction, Prosthetic Dependency, Pain, Abnormal gait  Visit Diagnosis: Other abnormalities of gait and mobility  Muscle weakness (generalized)  Unsteadiness on feet  Abnormal posture  Pain in left leg  Phantom limb syndrome with pain (HCC)  Stiffness of left hip, not elsewhere classified     Problem List Patient Active Problem List   Diagnosis Date Noted   Encounter for loop recorder check 07/26/2021   Cryptogenic stroke ([place]) 05/10/2021   Loop  Biotronic [id number] 05/10/2021 05/10/2021   AVM (arteriovenous malformation) 03/20/2021   Pulmonary AV (arteriovenous) fistula ([place]) 02/20/2021   Pre-ulcerative calluses 12/20/2020   Cerebral thrombosis with cerebral infarction 12/11/2020   Cerebrovascular accident (CVA) due to embolism of cerebral artery ([place]) 12/11/2020   Pressure injury of skin 11/12/2020   Acute urinary retention 11/09/2020   Normal anion gap metabolic acidosis 00/37/9444   Hypotension 11/07/2020   Chest pain 10/31/2020   Acute on chronic HFrEF (heart failure with reduced ejection fraction) ([place])    Goals of care, counseling/discussion    Palliative care by specialist    DNR (do not resuscitate)    Critical limb ischemia with history of revascularization of same extremity ([place]) 10/30/2020   Hyperglycemia 10/30/2020   Tobacco abuse 10/30/2020   Leukocytosis 10/30/2020    Chronic combined systolic and diastolic CHF (congestive heart failure) ([place]) 10/30/2020   Ischemic cardiomyopathy 10/24/2020   AKI (acute kidney injury) ([place]) 10/17/2020   PAD (peripheral artery disease) ([place]) 10/16/2020   Critical lower limb ischemia ([place]) 10/05/2020   Rest pain of left upper extremity due to atherosclerosis ([place]) 10/05/2020   Severe claudication ([place]) 09/20/2020   Paresthesia of both lower extremities 09/20/2020   H/O cardiac arrest 09/20/2020   H/O acute myocardial infarction 08/14/2020   Snoring 08/14/2020   Sinus pause 08/14/2020   Mixed hyperlipidemia 08/14/2020   Tobacco dependence 08/14/2020   Coronary artery disease involving native coronary artery of native heart without angina pectoris 08/10/2020   HFrEF (heart failure with reduced ejection fraction) ([place]) 08/10/2020   Nonrheumatic mitral valve regurgitation 08/10/2020   STEMI (ST elevation myocardial infarction) ([place]) 08/02/2020   Accelerated hypertension 08/01/2020    Jamey Reas, PT, DPT 07/22/2022, 12:53 PM  [facility] [address] [place], [place], [address] Phone: [phone]   Fax:  [phone]  Name: Mike [name] MRN: [id number] Date of Birth: Feb 26, 1961

## 2022-07-29 DIAGNOSIS — E785 Hyperlipidemia, unspecified: Secondary | ICD-10-CM | POA: Diagnosis not present

## 2022-07-29 DIAGNOSIS — I2510 Atherosclerotic heart disease of native coronary artery without angina pectoris: Secondary | ICD-10-CM | POA: Diagnosis not present

## 2022-07-29 DIAGNOSIS — I10 Essential (primary) hypertension: Secondary | ICD-10-CM | POA: Diagnosis not present

## 2022-07-29 DIAGNOSIS — Z0000 Encounter for general adult medical examination without abnormal findings: Secondary | ICD-10-CM | POA: Diagnosis not present

## 2022-07-29 DIAGNOSIS — Z125 Encounter for screening for malignant neoplasm of prostate: Secondary | ICD-10-CM | POA: Diagnosis not present

## 2022-07-29 DIAGNOSIS — I739 Peripheral vascular disease, unspecified: Secondary | ICD-10-CM | POA: Diagnosis not present

## 2022-08-01 DIAGNOSIS — Z1211 Encounter for screening for malignant neoplasm of colon: Secondary | ICD-10-CM | POA: Diagnosis not present

## 2022-08-05 ENCOUNTER — Other Ambulatory Visit: Payer: Self-pay

## 2022-08-05 ENCOUNTER — Encounter: Payer: Self-pay | Admitting: Physical Therapy

## 2022-08-05 ENCOUNTER — Ambulatory Visit (INDEPENDENT_AMBULATORY_CARE_PROVIDER_SITE_OTHER): Payer: BC Managed Care – PPO | Admitting: Physical Therapy

## 2022-08-05 DIAGNOSIS — R2689 Other abnormalities of gait and mobility: Secondary | ICD-10-CM | POA: Diagnosis not present

## 2022-08-05 DIAGNOSIS — M79605 Pain in left leg: Secondary | ICD-10-CM

## 2022-08-05 DIAGNOSIS — G546 Phantom limb syndrome with pain: Secondary | ICD-10-CM

## 2022-08-05 DIAGNOSIS — I739 Peripheral vascular disease, unspecified: Secondary | ICD-10-CM

## 2022-08-05 DIAGNOSIS — R293 Abnormal posture: Secondary | ICD-10-CM | POA: Diagnosis not present

## 2022-08-05 DIAGNOSIS — I5020 Unspecified systolic (congestive) heart failure: Secondary | ICD-10-CM

## 2022-08-05 DIAGNOSIS — I2510 Atherosclerotic heart disease of native coronary artery without angina pectoris: Secondary | ICD-10-CM

## 2022-08-05 DIAGNOSIS — R2681 Unsteadiness on feet: Secondary | ICD-10-CM | POA: Diagnosis not present

## 2022-08-05 DIAGNOSIS — M6281 Muscle weakness (generalized): Secondary | ICD-10-CM | POA: Diagnosis not present

## 2022-08-05 DIAGNOSIS — M25652 Stiffness of left hip, not elsewhere classified: Secondary | ICD-10-CM

## 2022-08-05 MED ORDER — RIVAROXABAN 2.5 MG PO TABS: 2.5000 mg | ORAL_TABLET | Freq: Two times a day (BID) | ORAL | 3 refills | Status: DC

## 2022-08-05 MED ORDER — ENTRESTO 49-51 MG PO TABS: 1.0000 | ORAL_TABLET | Freq: Two times a day (BID) | ORAL | 3 refills | Status: DC

## 2022-08-05 NOTE — Patient Instructions (Signed)
In apt try to walk without cane or device In community use cane carrying crutch in other hand - increase distance and ramp/curb/stairs Walk 3 steps eyes open & eyes closed near on left side Pick up speed first longer steps then longer & quicker steps.

## 2022-08-05 NOTE — Therapy (Signed)
[place], [place], [address] Phone: [phone]   Fax:  [phone]  Physical Therapy Treatment  Patient Details  Name: Mike [name] MRN: [id number] Date of Birth: 05-Sep-1961 Referring Provider (PT): Meridee Score, MD   Encounter Date: 08/05/2022   PT End of Session - 08/05/22 1150     Visit Number 37    Number of Visits 49    Date for PT Re-Evaluation 06/12/22    Authorization Type BCBS    Authorization Time Period [employer]  Active Policy  Policy Effective  19/37/9024 - 12/28/9998  20% COINSURANCE 12/29/2021 - Open-ended Deductible ($1,000.00)  $0.00 Paid $1,000.00 to go Out-of-Pocket Limit ($5,050.00)  $0.00 Paid $5,050.00 to go    PT Start Time 1145    PT Stop Time 1230    PT Time Calculation (min) 45 min    Equipment Utilized During Treatment Gait belt    Activity Tolerance Patient tolerated treatment well;Patient limited by pain    Behavior During Therapy [facility] for tasks assessed/performed             Past Medical History:  Diagnosis Date   Anxiety    CAD (coronary artery disease)    Cryptogenic stroke ([place]) 05/10/2021   Encounter for loop recorder check 07/26/2021   Heart attack ([place]) 08/01/2020   Heart failure with reduced ejection fraction ([place])    Hyperlipidemia    Hypertension    Loop Biotronic [id number] 05/10/2021 05/10/2021   PAD (peripheral artery disease) ([place])    right CIA & right distal iliac artery stents 10/16/20; left AKA 11/06/20   PFO (patent foramen ovale) 12/14/2020   Pneumonia    Pulmonary AV (arteriovenous) fistula ([place]) 12/14/2020   Stroke ([place]) 12/11/2020   no residuial effects    Past Surgical History:  Procedure Laterality Date   ABDOMINAL AORTOGRAM W/LOWER EXTREMITY N/A 10/16/2020   Procedure: ABDOMINAL AORTOGRAM W/LOWER EXTREMITY;  Surgeon: Nigel Mormon, MD;  Location: [facility];  Service: Cardiovascular;  Laterality: N/A;   AMPUTATION Left 11/06/2020    Procedure: AMPUTATION ABOVE KNEE;  Surgeon: Rosetta Posner, MD;  Location: [place];  Service: Vascular;  Laterality: Left;   BUBBLE STUDY  12/14/2020   Procedure: BUBBLE STUDY;  Surgeon: Nigel Mormon, MD;  Location: [place];  Service: Cardiovascular;;   CARDIAC CATHETERIZATION     CORONARY STENT INTERVENTION N/A 08/03/2020   Procedure: CORONARY STENT INTERVENTION;  Surgeon: Nigel Mormon, MD;  Location: [facility] [facility];  Service: Cardiovascular;  Laterality: N/A;   CORONARY/GRAFT ACUTE MI REVASCULARIZATION N/A 08/01/2020   Procedure: Coronary/Graft Acute MI Revascularization;  Surgeon: Nigel Mormon, MD;  Location: [place] [facility];  Service: Cardiovascular;  Laterality: N/A;   INTRAVASCULAR LITHOTRIPSY Right 10/16/2020   Procedure: INTRAVASCULAR LITHOTRIPSY;  Surgeon: Nigel Mormon, MD;  Location: [facility];  Service: Cardiovascular;  Laterality: Right;  Common and External Iliac   IR ANGIOGRAM PULMONARY LEFT SELECTIVE  03/20/2021   IR ANGIOGRAM SELECTIVE EACH ADDITIONAL VESSEL  03/20/2021   IR EMBO ARTERIAL NOT HEMORR HEMANG INC GUIDE ROADMAPPING  03/20/2021   IR RADIOLOGIST EVAL & MGMT  01/30/2021   IR RADIOLOGIST EVAL & MGMT  04/18/2021   IR RADIOLOGIST EVAL & MGMT  07/25/2021   IR US GUIDE VASC ACCESS RIGHT  03/20/2021   LEFT HEART CATH AND CORONARY ANGIOGRAPHY N/A 08/01/2020   Procedure: LEFT HEART CATH AND CORONARY ANGIOGRAPHY;  Surgeon: Nigel Mormon, MD;  Location: [place]  [facility];  Service: Cardiovascular;  Laterality: N/A;   LEFT HEART CATH AND CORONARY ANGIOGRAPHY N/A 08/03/2020   Procedure: LEFT HEART CATH AND CORONARY ANGIOGRAPHY;  Surgeon: Nigel Mormon, MD;  Location: [place] [facility];  Service: Cardiovascular;  Laterality: N/A;   PERIPHERAL VASCULAR INTERVENTION Right 10/16/2020   Procedure: PERIPHERAL VASCULAR INTERVENTION;  Surgeon: Nigel Mormon, MD;  Location: [place];  Service: Cardiovascular;  Laterality:  Right;  Common and Iliac Stent   RADIOLOGY WITH ANESTHESIA N/A 03/20/2021   Procedure: PULMONARY EMBOLIZATION;  Surgeon: Aletta Edouard, MD;  Location: [place];  Service: Radiology;  Laterality: N/A;   TEE WITHOUT CARDIOVERSION N/A 12/14/2020   Procedure: TRANSESOPHAGEAL ECHOCARDIOGRAM (TEE);  Surgeon: Nigel Mormon, MD;  Location: [facility];  Service: Cardiovascular;  Laterality: N/A;    There were no vitals filed for this visit.   Subjective Assessment - 08/05/22 1150     Subjective the pain is not limiting him with function over last 2 weeks.  He is wearing prosthesis 14-15 hours per day without midday break with no issues.  In the house he using cane 50% of time with no near falls with occassional touch on furniture.  in community he has been using cane with crutch in other hand: he can go ~200' before fatigue, on stairs he is using crutch & rail, ramps not tried cane only (not fear more not in his world), curb cane okay but cautious and grass / uneven terrain not tried yet.    Pertinent History PAD, gangrene s/p L AKA 11/06/20, HTN, CAD s/p STEMI with stenting 08/17/2020, and CHF. cryptogenic stroke Dec 2021    Patient Stated Goals to return to work, (he was released from current job over last weekend but wants to find job).  to walk in community,    Currently in Pain? Yes    Pain Score 0-No pain   up to 3/10 in last weeks for 1-2 hours only   Pain Location Leg   residual limb pain   Pain Orientation Left;Distal    Pain Descriptors / Indicators Discomfort;Tightness    Pain Type Chronic pain    Pain Onset More than a month ago    Pain Frequency Intermittent    Aggravating Factors  dehydration    Pain Relieving Factors tylenol & Rx meds    Pain Onset More than a month ago                               [facility] Adult PT Treatment/Exercise - 08/05/22 1145       Transfers   Transfers Sit to Stand;Stand to Sit    Sit to Stand 6: Modified independent  (Device/Increase time);With upper extremity assist;From chair/3-in-1    Stand to Sit 6: Modified independent (Device/Increase time);With upper extremity assist;To chair/3-in-1      Ambulation/Gait   Ambulation/Gait Yes    Ambulation/Gait Assistance 5: Supervision;4: Min guard   cane & no device except TFA prosthesis   Ambulation/Gait Assistance Details 300' X 2 with cane working on increasing gait speed & alt 3 steps eyes open/eyes closed.  100' X 2 no device    Ambulation Distance (Feet) --    Assistive device Prosthesis;None;Straight cane    Ambulation Surface Level;Indoor    Stairs Assistance --    Stair Management Technique --    Number of Stairs --    Height of Stairs --  Ramp 5: Supervision;6: Modified independent (Device)   cane & TFA prosthesis   Curb 5: Supervision   cane & TFA prosthesis   Gait Comments --      High Level Balance   High Level Balance Activities --    High Level Balance Comments --      Prosthetics   Prosthetic Care Comments  prosthetic knee is too externally rotated and foot toe-in which causes a whip impacting timing of prosthetic knee flex / ext during gait.  PT recommended setting appt with prosthetist. PT spoke with Brooke Pace, [facility] who is aware of issues.    Current prosthetic wear tolerance (days/week)  daily    Current prosthetic wear tolerance (#hours/day)  >15 hours    Residual limb condition  reports no issues    Education Provided Other (comment)   see prosthetic care comments   Person(s) Educated Patient    Education Method Explanation;Verbal cues    Education Method Verbalized understanding            In apt try to walk without cane or device In community use cane carrying crutch in other hand - increase distance and ramp/curb/stairs Walk 3 steps eyes open & eyes closed near on left side Pick up speed first longer steps then longer & quicker steps.             PT Short Term Goals - 08/05/22 1611       PT SHORT TERM GOAL  #1   Title Patient verbalizes understanding of updated HEP / prosthetic activities.    Time 3    Period --   visits   Status On-going    Target Date 09/16/22      PT SHORT TERM GOAL #2   Title patient ambulates 500' with cane, negotiates ramp & curb with supervision.    Time 3    Period --   visits   Status On-going    Target Date 09/16/22      PT SHORT TERM GOAL #3   Title Patient reports prosthesis wear >90% awake hours per day with limb pain </= 2/10    Time 3    Period --   visits   Status On-going    Target Date 07/08/22      PT SHORT TERM GOAL #4   Title patient ambulates 74' without device carrying open bottle of water with no balance losses.    Time 3    Period --   visits   Status New    Target Date 09/16/22               PT Long Term Goals - 06/10/22 1249       PT LONG TERM GOAL #1   Title Patient tolerates wear >90% of awake hours without skin issues and limb pain <2/10.    Baseline 06/10/2022 progressing with wear 70-95% of awake hours with limb pain 1/10 to 6.5/10 but only for couple of hours.    Time 8   visits   Period --    Status On-going    Target Date 10/14/22      PT LONG TERM GOAL #2   Title Timed Up & Go standard with cane and prosthesis < 13.5sec and cognitive TUG <15.5sec.  TUG with prosthesis only <25sec.    Baseline 06/10/2022 improved TUG to 16.94 sec with prosthesis only with supervision.    Time 8   visits   Period --    Status On-going  Target Date 10/14/22      PT LONG TERM GOAL #3   Title dynamic gait index with prosthesis & cane >/= 16/24.    Baseline 06/10/2022 improved DGI to 14/24 with cane    Time 8   visits   Period --    Status On-going    Target Date 10/14/22      PT LONG TERM GOAL #4   Title Patient ambulates 500' with cane & prosthesis modified independent.    Baseline 06/10/2022 improved to ambulate 256' with cane with supervision before fatigues.    Time 8   visits   Period --    Status On-going    Target  Date 10/14/22      PT LONG TERM GOAL #5   Title Patient negotiates ramps, curbs & stairs single rail with cane & prosthesis modified independent.    Baseline progressing 06/10/2022 he requires supervision with cane to neg ramps, curbs.  stairs step to pattern with cane & rail safely.    Time 8   visits   Period --    Status On-going    Target Date 10/14/22      PT LONG TERM GOAL #6   Title Patient ambulates 100' carrying <5# item with prosthesis only modified independent.    Baseline 06/10/2022 he ambulates 25' without device with close supervision.    Time 8   visits   Period --    Status On-going    Target Date 10/14/22                   Plan - 08/05/22 1609     Clinical Impression Statement Patient is improving prosthetic gait with cane for limited community gait and no device for short household gait.  He needs to see prosthetist for rotation alignment.  PT had to adjust crutches as he is more upright now.  He continues to benefit from skilled PT to advance his functional tasks.    Personal Factors and Comorbidities Comorbidity 3+;Fitness;Time since onset of injury/illness/exacerbation    Comorbidities PAD, gangrene s/p L AKA 11/06/20, HTN, CAD s/p STEMI with stenting 08/17/2020, and CHF. cryptogenic stroke Dec 2021    Examination-Activity Limitations Lift;Locomotion Level;Squat;Stairs;Stand;Transfers;Carry    Examination-Participation Restrictions Community Activity;Occupation    Stability/Clinical Decision Making Evolving/Moderate complexity    Rehab Potential Good    PT Frequency Biweekly    PT Duration --   8 visits   PT Treatment/Interventions ADLs/Self Care Home Management;DME Instruction;Gait training;Stair training;Functional mobility training;Therapeutic activities;Therapeutic exercise;Balance training;Neuromuscular re-education;Patient/family education;Prosthetic Training;Manual techniques;Passive range of motion;Ultrasound;Vestibular;Other (comment)   dry needling &  Physical Performance Testing   PT Next Visit Plan check how gait with cane in community & no device in home are going, progress gait including functional tasks 1x every other week with instruction for progressing activities outside of PT,    Consulted and Agree with Plan of Care Patient             Patient will benefit from skilled therapeutic intervention in order to improve the following deficits and impairments:  Decreased activity tolerance, Decreased balance, Decreased endurance, Decreased knowledge of use of DME, Decreased mobility, Decreased range of motion, Decreased strength, Increased edema, Postural dysfunction, Prosthetic Dependency, Pain, Abnormal gait  Visit Diagnosis: Other abnormalities of gait and mobility  Muscle weakness (generalized)  Unsteadiness on feet  Abnormal posture  Pain in left leg  Phantom limb syndrome with pain (HCC)  Stiffness of left hip, not elsewhere classified  Problem List Patient Active Problem List   Diagnosis Date Noted   Encounter for loop recorder check 07/26/2021   Cryptogenic stroke ([place]) 05/10/2021   Loop [employer]otronic [id number] 05/10/2021 05/10/2021   AVM (arteriovenous malformation) 03/20/2021   Pulmonary AV (arteriovenous) fistula ([place]) 02/20/2021   Pre-ulcerative calluses 12/20/2020   Cerebral thrombosis with cerebral infarction 12/11/2020   Cerebrovascular accident (CVA) due to embolism of cerebral artery ([place]) 12/11/2020   Pressure injury of skin 11/12/2020   Acute urinary retention 11/09/2020   Normal anion gap metabolic acidosis 21/30/8657   Hypotension 11/07/2020   Chest pain 10/31/2020   Acute on chronic HFrEF (heart failure with reduced ejection fraction) ([place])    Goals of care, counseling/discussion    Palliative care by specialist    DNR (do not resuscitate)    Critical limb ischemia with history of revascularization of same extremity ([place]) 10/30/2020   Hyperglycemia 10/30/2020   Tobacco abuse 10/30/2020    Leukocytosis 10/30/2020   Chronic combined systolic and diastolic CHF (congestive heart failure) ([place]) 10/30/2020   Ischemic cardiomyopathy 10/24/2020   AKI (acute kidney injury) ([place]) 10/17/2020   PAD (peripheral artery disease) ([place]) 10/16/2020   Critical lower limb ischemia ([place]) 10/05/2020   Rest pain of left upper extremity due to atherosclerosis ([place]) 10/05/2020   Severe claudication ([place]) 09/20/2020   Paresthesia of both lower extremities 09/20/2020   H/O cardiac arrest 09/20/2020   H/O acute myocardial infarction 08/14/2020   Snoring 08/14/2020   Sinus pause 08/14/2020   Mixed hyperlipidemia 08/14/2020   Tobacco dependence 08/14/2020   Coronary artery disease involving native coronary artery of native heart without angina pectoris 08/10/2020   HFrEF (heart failure with reduced ejection fraction) ([place]) 08/10/2020   Nonrheumatic mitral valve regurgitation 08/10/2020   STEMI (ST elevation myocardial infarction) ([place]) 08/02/2020   Accelerated hypertension 08/01/2020    Jamey Reas, PT, DPT 08/05/2022, 4:16 PM  [facility] [address] [place], [place], [address] Phone: [phone]   Fax:  [phone]  Name: Mike [name] MRN: [id number] Date of Birth: Jun 17, 1961

## 2022-08-13 NOTE — Progress Notes (Signed)
Follow up visit  Subjective:   Mike [name], male    DOB: 1961-03-15, 61 y.o.   MRN: [id number]    HPI   Chief Complaint  Patient presents with   Coronary Artery Disease   Follow-up    4 week    61 y.o. Caucasian male with CAD, culprit (RCA) and nonculprit (LCx) PCI after STEMI 07/2020, HFrEF, moderate MR, h/o cardiac arrest 2/2 Torsades post PCI during index hospitalization, PAD with critical limb ischemia s/p Rt iliac revascularization, and s/p Lt AKA amputation for critical limb ischemia,  severe bilateral renal artery stenoses w/h/o CIN (09/2020), multifocal stroke s/p tPA with resolution of neurodeficit (11/2020), large AV pulmonary fistula-s/p closure by Dr. Kathlene Cote (02/2021), small PFO.  Patient is doing well and continues to not have ay significant dyspnea, leg edema, orthopnea symptoms. He has tolerated Entresto well, lab details below. Reviewed recent test results with the patient, details below.    Current Outpatient Medications:    acetaminophen (TYLENOL) 500 MG tablet, Take 500-1,000 mg by mouth every 6 (six) hours as needed (pain)., Disp: , Rfl:    aspirin EC 81 MG tablet, Take 1 tablet (81 mg total) by mouth daily. Swallow whole., Disp: 30 tablet, Rfl: 3   dapagliflozin propanediol (FARXIGA) 10 MG TABS tablet, Take 1 tablet (10 mg total) by mouth daily before breakfast., Disp: 30 tablet, Rfl: 2   DULoxetine (CYMBALTA) 30 MG capsule, TAKE 1 CAPSULE BY MOUTH EVERY DAY, Disp: 90 capsule, Rfl: 2   ezetimibe (ZETIA) 10 MG tablet, Take 1 tablet (10 mg total) by mouth daily., Disp: 90 tablet, Rfl: 3   furosemide (LASIX) 20 MG tablet, Take 1 tablet (20 mg total) by mouth daily., Disp: 90 tablet, Rfl: 1   gabapentin (NEURONTIN) 300 MG capsule, Take 1 capsule (300 mg total) by mouth 2 (two) times daily. (Patient taking differently: Take 300 mg by mouth in the morning and at bedtime.), Disp: 60 capsule, Rfl: 0   metoprolol succinate (TOPROL-XL) 25 MG 24 hr tablet, Take 1 tablet  (25 mg total) by mouth daily. Take with or immediately following a meal., Disp: 90 tablet, Rfl: 3   Multiple Vitamins-Minerals (MULTI FOR HIM 50+) TABS, Take 1 tablet by mouth See admin instructions., Disp: , Rfl:    nitroGLYCERIN (NITROSTAT) 0.4 MG SL tablet, Place 1 tablet (0.4 mg total) under the tongue every 5 (five) minutes as needed for chest pain., Disp: 25 tablet, Rfl: 1   rivaroxaban (XARELTO) 2.5 MG TABS tablet, Take 1 tablet (2.5 mg total) by mouth 2 (two) times daily., Disp: 180 tablet, Rfl: 3   rosuvastatin (CRESTOR) 40 MG tablet, Take 1 tablet (40 mg total) by mouth every evening., Disp: 90 tablet, Rfl: 3   sacubitril-valsartan (ENTRESTO) 49-51 MG, Take 1 tablet by mouth 2 (two) times daily., Disp: 180 tablet, Rfl: 3  Cardiovascular & other pertient studies:  EKG 07/03/2022: Sinus rhythm 65 bpm Old anterior and inferior infarct Poor R wave progression  Echocardiogram 06/25/2022:  Left ventricle cavity is severely dilated. Normal left ventricular wall  thickness. Moderate inferolateral hypokinesis, mild global hypokinesis.  LVEF 35-40%. Doppler evidence of grade I (impaired) diastolic dysfunction,  normal LAP.  Mild to moderate mitral regurgitation.  Mild tricuspid regurgitation. Estimated pulmonary artery systolic pressure  41 mmHg.  Previous study on 09/10/2021 noted mild LV dilatation, LVEF 40-45%, mod MR,  no PH.  Remote loop recorder transmission 06/16/2022: Predominant rhythm is normal sinus rhythm.  Atrial monitoring episodes reveal PVCs and occasional  ventricular couplets.  There was no atrial fibrillation, no asystole episodes since last transmission a month ago.     CTA Chest 12/14/2020: 1. Large 3.1 x 1.9 cm pulmonary arteriovenous fistula in the medial left lower lobe. 2. Right middle lobe 3 mm solid pulmonary nodule. Follow-up noncontrast chest CT recommended in 12 months in this high risk patient.This recommendation follows the consensus statement: Guidelines  for Management of Incidental Pulmonary Nodules Detected on CT Images: From the Fleischner Society 2017; Radiology 2017; 284:228-243. 3. Mild cardiomegaly. 4. Aortic Atherosclerosis (ICD10-I70.0) and Emphysema (ICD10-J43.9).    PV intervention 10/16/2020: Successful intravascular lithotripsy, PTCA and stenting     7.0X39 mm balloon expandable Viabahn VBX stent Rt common iliac artery      7.0X60 mm self expanding Absolute Pro stent Rt distal iliac artery   No named vessels at ankle level in both lower extremities.  Will consult vascular surgery for Rt fem-to-left profunda bypass given left foot critical limb ischemia.   Abdominal Aortic Duplex  09/21/2020:  Moderate plaque noted in the proximal, mid and distal aorta. There is an  ulcerated plaque noted in the distal abdominal aorta. No AAA. Normal iliac  artery velocity.   Lower Extremity Arterial Duplex 09/21/2020:  The right SFA is occluded in the proximal segment with reconstitution at  the level of the popliteal artery with diffuse monophasic waveform below  the knee. Right profunda femoral artery has >50% stenosis. There is  moderate mixed plaque noted throughout the right lower extremity.  Monophasic waveform throughout the left lower extremity, indicates  significant proximal disease (iliac artery).   Left SFA is occluded in the proximal segment and reconstitutes just above popliteal artery and diffuse dampened monophasic waveform throughout the lower extremity below the  knee. There is moderate mixed plaque throughout the left lower extremity.   This exam reveals severely decreased perfusion of the right lower  extremity, noted at the dorsalis pedis artery level (ABI 0.42) and  critically decreased perfusion of the left lower extremity, noted at the  dorsalis pedis and post tibial artery level (ABI 0.03).   Coronary intervention 08/03/2020: LM: Distal 40% stenosis LAD: Mid 30% disease LCx: Subtotally occluded OM2, prox LCx  70% stenosis        Successful percutaneous coronary intervention OM2-Prox LCx        PTCA and overlapping stents placement         2.5 X 38 mm and 2.5 X 18 mm Resolute Onyx drug-eluting stents        100%--->0% stenosis. TIMI flow 0-->III        Small caliber distal vessel with moderate diffuse disease RCA: Prox 30% stenosis.         Patent mid RCA sttent 2.5 X 26 mm Resolute Onyx drug-eluting stent   LVEDP 42 mmHg   Coronary intervention 08/01/2020: LM: Distal 30% stenosis LAD: Mid 30% disease LCx: Subtotally occluded OM2, bridging left-to-left and right-to-left collaterals RCA: Prox 30% stenosis.        Mid 100% occlusion        Successful percutaneous coronary intervention mid RCA        PTCA and stent placement 2.5 X 26 mm Resolute Onyx drug-eluting stent 100%--->0% stenosis. TIMI flow 0-->III     Recent labs: 07/17/2022: Glucose 89, BUN/Cr 23/1.28. EGFR 63. Na/K 141/4.5.  Chol 113, TG 278, HDL 33, LDL 38  12/06/2021: Chol 127, TG 215, HDL 37, LDL 55   Review of Systems  Cardiovascular:  Negative for chest  pain, dyspnea on exertion, leg swelling, palpitations and syncope.  Musculoskeletal:        Left leg phantom pain          There were no vitals filed for this visit.    There is no height or weight on file to calculate BMI. There were no vitals filed for this visit.    Objective:   Physical Exam Neck:     Vascular: No JVD.  Cardiovascular:     Rate and Rhythm: Normal rate and regular rhythm.     Pulses:          Dorsalis pedis pulses are 0 on the right side.       Posterior tibial pulses are 0 on the right side.     Heart sounds: No murmur heard. Musculoskeletal:     Right lower leg: No edema.     Comments: S/p left AKA Now has prosthesis          Assessment & Recommendations:   61 y.o. Caucasian male with CAD, culprit (RCA) and nonculprit (LCx) PCI after STEMI 07/2020, HFrEF, moderate MR, h/o cardiac arrest 2/2 Torsades post PCI during index  hospitalization, PAD with critical limb ischemia s/p Rt iliac revascularization, and s/p Lt AKA amputation for critical limb ischemia,  severe bilateral renal artery stenoses w/h/o CIN (09/2020), stroke s/p tPA with resolution of neurodeficit (11/2020), multifocal stroke on MRI suspicious for cardioembolic source, small PFO/secundum ASD and large pulmonary AV fistula now s/p closure by Dr. Kathlene Cote (02/2021), ILR placement (04/2021), asymptomatic sinus pauses  HFrEF, secondary mitral regurgitation: Severely dilated LV, EF 35-40% (05/2022), mild PH Clinically, appears fairly compensated. Will need cautious uptitration ofGDMT for HFrEF given renal dysfunction and known bilateral renal artery stenosis. Currently on Entresto 24-26 mg bid, metoprolol succinate 25 mg daily, Farxiga 10 mg daily. Will add spironolactone 12.5 mg daily. Will check BMP and proBNP in 7-10 days.  Asymptomatic sinus pauses: Likely due to OSA. He is not interested in sleep testing. No recurrence.  Stroke: Multifocal stroke with small PFO/secundum ASD but large pulmonary AV fistula (11/2020), now s/p closure by Dr. Kathlene Cote (02/2021) Repeat TCD bubble study as pr Dr. Clydene Fake recommendations S/p ILR placement (04/2021). No Afib detected thus far  PAD: Successful revascularization of right common iliac and right external iliac artery (09/2020) S/p Lt AKA.  No critical limb ischemia, although he has pre-ulcerative calloses on right sole.  Continue Aspirin 81 mg daily and Xarelto 2.5 mg bid.  CAD: S/p STEMI 07/2020. Currently no angina symptoms Continue aspirin 81 mg daily and Xarelto 2.5 mg bid. Continue Crestor 40 mg, Zetia 10 mg daily.  TG 278, LDL 38.  Added Vascepa 2 g bid. Repeat lipid panel in 3 months.   F/u in 4 weeks   Nigel Mormon, MD Pager: [phone] Office: [phone]

## 2022-08-14 ENCOUNTER — Ambulatory Visit: Payer: BC Managed Care – PPO | Admitting: Cardiology

## 2022-08-14 ENCOUNTER — Encounter: Payer: Self-pay | Admitting: Cardiology

## 2022-08-14 VITALS — BP 136/83 | HR 80 | Temp 98.0°F | Resp 16 | Ht 72.0 in | Wt 197.0 lb

## 2022-08-14 DIAGNOSIS — E782 Mixed hyperlipidemia: Secondary | ICD-10-CM | POA: Diagnosis not present

## 2022-08-14 DIAGNOSIS — I5020 Unspecified systolic (congestive) heart failure: Secondary | ICD-10-CM | POA: Diagnosis not present

## 2022-08-14 DIAGNOSIS — I2510 Atherosclerotic heart disease of native coronary artery without angina pectoris: Secondary | ICD-10-CM | POA: Diagnosis not present

## 2022-08-14 DIAGNOSIS — I739 Peripheral vascular disease, unspecified: Secondary | ICD-10-CM

## 2022-08-14 MED ORDER — ICOSAPENT ETHYL 1 G PO CAPS: 2.0000 g | ORAL_CAPSULE | Freq: Two times a day (BID) | ORAL | 2 refills | Status: DC

## 2022-08-14 MED ORDER — SPIRONOLACTONE 25 MG PO TABS: 12.5000 mg | ORAL_TABLET | Freq: Every day | ORAL | 3 refills | Status: DC

## 2022-08-15 ENCOUNTER — Telehealth: Payer: Self-pay | Admitting: Orthopedic Surgery

## 2022-08-15 NOTE — Telephone Encounter (Signed)
Long Term Disability forms received. IC patient, lmvm advised unable to complete forms as last seen 11/22. Advised per AF, he needs to scheduled followup appt before forms can be completed.

## 2022-08-17 ENCOUNTER — Encounter: Payer: Self-pay | Admitting: Cardiology

## 2022-08-17 DIAGNOSIS — Z4509 Encounter for adjustment and management of other cardiac device: Secondary | ICD-10-CM | POA: Diagnosis not present

## 2022-08-17 DIAGNOSIS — Z95818 Presence of other cardiac implants and grafts: Secondary | ICD-10-CM | POA: Diagnosis not present

## 2022-08-17 DIAGNOSIS — I5020 Unspecified systolic (congestive) heart failure: Secondary | ICD-10-CM | POA: Diagnosis not present

## 2022-08-19 ENCOUNTER — Ambulatory Visit (INDEPENDENT_AMBULATORY_CARE_PROVIDER_SITE_OTHER): Payer: BC Managed Care – PPO | Admitting: Physical Therapy

## 2022-08-19 ENCOUNTER — Encounter: Payer: Self-pay | Admitting: Physical Therapy

## 2022-08-19 DIAGNOSIS — R293 Abnormal posture: Secondary | ICD-10-CM | POA: Diagnosis not present

## 2022-08-19 DIAGNOSIS — G546 Phantom limb syndrome with pain: Secondary | ICD-10-CM

## 2022-08-19 DIAGNOSIS — R2689 Other abnormalities of gait and mobility: Secondary | ICD-10-CM

## 2022-08-19 DIAGNOSIS — R2681 Unsteadiness on feet: Secondary | ICD-10-CM | POA: Diagnosis not present

## 2022-08-19 DIAGNOSIS — M6281 Muscle weakness (generalized): Secondary | ICD-10-CM

## 2022-08-19 DIAGNOSIS — M79605 Pain in left leg: Secondary | ICD-10-CM

## 2022-08-19 NOTE — Therapy (Signed)
[facility] [address], [place], [address] Phone: [phone]   Fax:  [phone]  Physical Therapy Treatment  Patient Details  Name: Mike [name] MRN: [id number] Date of Birth: 23-Aug-1961 Referring Provider (PT): Meridee Score, MD   Encounter Date: 08/19/2022   PT End of Session - 08/19/22 1147     Visit Number 7    Number of Visits 39    Date for PT Re-Evaluation 06/12/22    Authorization Type BCBS    Authorization Time Period BCBS  Active Policy  Policy Effective  99/24/2683 - 12/28/9998  20% COINSURANCE 12/29/2021 - Open-ended Deductible ($1,000.00)  $0.00 Paid $1,000.00 to go Out-of-Pocket Limit ($5,050.00)  $0.00 Paid $5,050.00 to go    PT Start Time 4196    PT Stop Time 1229    PT Time Calculation (min) 44 min    Equipment Utilized During Treatment Gait belt    Activity Tolerance Patient tolerated treatment well;Patient limited by pain    Behavior During Therapy [facility] for tasks assessed/performed             Past Medical History:  Diagnosis Date   Anxiety    CAD (coronary artery disease)    Cryptogenic stroke ([place]) 05/10/2021   Encounter for loop recorder check 07/26/2021   Heart attack ([place]) 08/01/2020   Heart failure with reduced ejection fraction ([place])    Hyperlipidemia    Hypertension    Loop Biotronic [id number] 05/10/2021 05/10/2021   PAD (peripheral artery disease) ([place])    right CIA & right distal iliac artery stents 10/16/20; left AKA 11/06/20   PFO (patent foramen ovale) 12/14/2020   Pneumonia    Pulmonary AV (arteriovenous) fistula ([place]) 12/14/2020   Stroke ([place]) 12/11/2020   no residuial effects    Past Surgical History:  Procedure Laterality Date   ABDOMINAL AORTOGRAM W/LOWER EXTREMITY N/A 10/16/2020   Procedure: ABDOMINAL AORTOGRAM W/LOWER EXTREMITY;  Surgeon: Nigel Mormon, MD;  Location: [facility] [facility];  Service: Cardiovascular;  Laterality: N/A;   AMPUTATION Left 11/06/2020    Procedure: AMPUTATION ABOVE KNEE;  Surgeon: Rosetta Posner, MD;  Location: [place];  Service: Vascular;  Laterality: Left;   BUBBLE STUDY  12/14/2020   Procedure: BUBBLE STUDY;  Surgeon: Nigel Mormon, MD;  Location: [place];  Service: Cardiovascular;;   CARDIAC CATHETERIZATION     CORONARY STENT INTERVENTION N/A 08/03/2020   Procedure: CORONARY STENT INTERVENTION;  Surgeon: Nigel Mormon, MD;  Location: [place] [facility];  Service: Cardiovascular;  Laterality: N/A;   CORONARY/GRAFT ACUTE MI REVASCULARIZATION N/A 08/01/2020   Procedure: Coronary/Graft Acute MI Revascularization;  Surgeon: Nigel Mormon, MD;  Location: [place] [facility];  Service: Cardiovascular;  Laterality: N/A;   INTRAVASCULAR LITHOTRIPSY Right 10/16/2020   Procedure: INTRAVASCULAR LITHOTRIPSY;  Surgeon: Nigel Mormon, MD;  Location: [place] [facility];  Service: Cardiovascular;  Laterality: Right;  Common and External Iliac   IR ANGIOGRAM PULMONARY LEFT SELECTIVE  03/20/2021   IR ANGIOGRAM SELECTIVE EACH ADDITIONAL VESSEL  03/20/2021   IR EMBO ARTERIAL NOT HEMORR HEMANG INC GUIDE ROADMAPPING  03/20/2021   IR RADIOLOGIST EVAL & MGMT  01/30/2021   IR RADIOLOGIST EVAL & MGMT  04/18/2021   IR RADIOLOGIST EVAL & MGMT  07/25/2021   IR US GUIDE VASC ACCESS RIGHT  03/20/2021   LEFT HEART CATH AND CORONARY ANGIOGRAPHY N/A 08/01/2020   Procedure: LEFT HEART CATH AND CORONARY ANGIOGRAPHY;  Surgeon: Nigel Mormon, MD;  Location: [place]  [facility];  Service: Cardiovascular;  Laterality: N/A;   LEFT HEART CATH AND CORONARY ANGIOGRAPHY N/A 08/03/2020   Procedure: LEFT HEART CATH AND CORONARY ANGIOGRAPHY;  Surgeon: Nigel Mormon, MD;  Location: [place] [facility];  Service: Cardiovascular;  Laterality: N/A;   PERIPHERAL VASCULAR INTERVENTION Right 10/16/2020   Procedure: PERIPHERAL VASCULAR INTERVENTION;  Surgeon: Nigel Mormon, MD;  Location: [place];  Service: Cardiovascular;  Laterality:  Right;  Common and Iliac Stent   RADIOLOGY WITH ANESTHESIA N/A 03/20/2021   Procedure: PULMONARY EMBOLIZATION;  Surgeon: Aletta Edouard, MD;  Location: [place];  Service: Radiology;  Laterality: N/A;   TEE WITHOUT CARDIOVERSION N/A 12/14/2020   Procedure: TRANSESOPHAGEAL ECHOCARDIOGRAM (TEE);  Surgeon: Nigel Mormon, MD;  Location: [facility];  Service: Cardiovascular;  Laterality: N/A;    There were no vitals filed for this visit.   Subjective Assessment - 08/19/22 1145     Subjective Brooke Pace, [facility] adjusted 12 days ago and it took a week to feel comfortable.  He stayed on 2 crutches until he felt comfortable as PT recommended. No falls.  He has used one crutch in house for last 4-5 days but not comfortable with cane yet.    Pertinent History PAD, gangrene s/p L AKA 11/06/20, HTN, CAD s/p STEMI with stenting 08/17/2020, and CHF. cryptogenic stroke Dec 2021    Patient Stated Goals to return to work, (he was released from current job over last weekend but wants to find job).  to walk in community,    Currently in Pain? No/denies   up to 2/10 for 1-2 hours.   Pain Location Leg   residual limb   Pain Orientation Left;Distal    Pain Descriptors / Indicators Tightness;Discomfort    Pain Type Chronic pain    Pain Onset More than a month ago    Pain Frequency Intermittent    Aggravating Factors  dehydration    Pain Relieving Factors tylenol & Rx meds    Pain Onset More than a month ago                               [facility] Adult PT Treatment/Exercise - 08/19/22 1145       Transfers   Transfers Sit to Stand;Stand to Sit    Sit to Stand 6: Modified independent (Device/Increase time);With upper extremity assist;From chair/3-in-1    Stand to Sit 6: Modified independent (Device/Increase time);With upper extremity assist;To chair/3-in-1      Ambulation/Gait   Ambulation/Gait Yes    Ambulation/Gait Assistance 5: Supervision;4: Min guard   cane & no device except TFA  prosthesis   Ambulation/Gait Assistance Details 300' X 2 with cane working on scanning, increasing gait speed & alt 3 steps eyes open/eyes closed.  100' X 2 no device    Assistive device Prosthesis;None;Straight cane    Ambulation Surface Level;Indoor    Ramp 5: Supervision;6: Modified independent (Device)   cane & TFA prosthesis   Curb 5: Supervision   cane & TFA prosthesis   Gait Comments PT recommended continue with household gait with cane and every 3-4th walk without device with goal higher frequency & no device needed for householde mobility, medium distance for basic community with cane (carrying one crutch for prn) 4+ times per day and 1 long (max tolerable) walk with 2 crutches & 1 long with single crutch.  pt verbalized understanding.      Self-Care  ADL's PT demo & verbal cues on stationary prosthetic foot position to facilitate knee staying extended with minimal muscle activity. pt verbalized understanding.      Prosthetics   Prosthetic Care Comments  --    Current prosthetic wear tolerance (days/week)  daily    Current prosthetic wear tolerance (#hours/day)  >15 hours    Residual limb condition  reports no issues    Education Provided --   see prosthetic care comments                      PT Short Term Goals - 08/05/22 1611       PT SHORT TERM GOAL #1   Title Patient verbalizes understanding of updated HEP / prosthetic activities.    Time 3    Period --   visits   Status On-going    Target Date 09/16/22      PT SHORT TERM GOAL #2   Title patient ambulates 500' with cane, negotiates ramp & curb with supervision.    Time 3    Period --   visits   Status On-going    Target Date 09/16/22      PT SHORT TERM GOAL #3   Title Patient reports prosthesis wear >90% awake hours per day with limb pain </= 2/10    Time 3    Period --   visits   Status On-going    Target Date 07/08/22      PT SHORT TERM GOAL #4   Title patient ambulates 39' without device  carrying open bottle of water with no balance losses.    Time 3    Period --   visits   Status New    Target Date 09/16/22               PT Long Term Goals - 06/10/22 1249       PT LONG TERM GOAL #1   Title Patient tolerates wear >90% of awake hours without skin issues and limb pain <2/10.    Baseline 06/10/2022 progressing with wear 70-95% of awake hours with limb pain 1/10 to 6.5/10 but only for couple of hours.    Time 8   visits   Period --    Status On-going    Target Date 10/14/22      PT LONG TERM GOAL #2   Title Timed Up & Go standard with cane and prosthesis < 13.5sec and cognitive TUG <15.5sec.  TUG with prosthesis only <25sec.    Baseline 06/10/2022 improved TUG to 16.94 sec with prosthesis only with supervision.    Time 8   visits   Period --    Status On-going    Target Date 10/14/22      PT LONG TERM GOAL #3   Title dynamic gait index with prosthesis & cane >/= 16/24.    Baseline 06/10/2022 improved DGI to 14/24 with cane    Time 8   visits   Period --    Status On-going    Target Date 10/14/22      PT LONG TERM GOAL #4   Title Patient ambulates 500' with cane & prosthesis modified independent.    Baseline 06/10/2022 improved to ambulate 256' with cane with supervision before fatigues.    Time 8   visits   Period --    Status On-going    Target Date 10/14/22      PT LONG TERM GOAL #5   Title Patient negotiates  ramps, curbs & stairs single rail with cane & prosthesis modified independent.    Baseline progressing 06/10/2022 he requires supervision with cane to neg ramps, curbs.  stairs step to pattern with cane & rail safely.    Time 8   visits   Period --    Status On-going    Target Date 10/14/22      PT LONG TERM GOAL #6   Title Patient ambulates 100' carrying <5# item with prosthesis only modified independent.    Baseline 06/10/2022 he ambulates 25' without device with close supervision.    Time 8   visits   Period --    Status On-going     Target Date 10/14/22                   Plan - 08/19/22 1241     Clinical Impression Statement Patient's prosthetic knee seems to be smoother transition between flexion & extension with changes in rotation alignment. He appears safe to progress household mobility without device and basic community with cane support.  He is progressing with his mobility with PT directions every 2 weeks.    Personal Factors and Comorbidities Comorbidity 3+;Fitness;Time since onset of injury/illness/exacerbation    Comorbidities PAD, gangrene s/p L AKA 11/06/20, HTN, CAD s/p STEMI with stenting 08/17/2020, and CHF. cryptogenic stroke Dec 2021    Examination-Activity Limitations Lift;Locomotion Level;Squat;Stairs;Stand;Transfers;Carry    Examination-Participation Restrictions Community Activity;Occupation    Stability/Clinical Decision Making Evolving/Moderate complexity    Rehab Potential Good    PT Frequency Biweekly    PT Duration --   8 visits   PT Treatment/Interventions ADLs/Self Care Home Management;DME Instruction;Gait training;Stair training;Functional mobility training;Therapeutic activities;Therapeutic exercise;Balance training;Neuromuscular re-education;Patient/family education;Prosthetic Training;Manual techniques;Passive range of motion;Ultrasound;Vestibular;Other (comment)   dry needling & Physical Performance Testing   PT Next Visit Plan work towards STGs,  check how gait with cane in community & no device in home are going, progress gait including functional tasks 1x every other week with instruction for progressing activities outside of PT,    Consulted and Agree with Plan of Care Patient             Patient will benefit from skilled therapeutic intervention in order to improve the following deficits and impairments:  Decreased activity tolerance, Decreased balance, Decreased endurance, Decreased knowledge of use of DME, Decreased mobility, Decreased range of motion, Decreased strength,  Increased edema, Postural dysfunction, Prosthetic Dependency, Pain, Abnormal gait  Visit Diagnosis: Other abnormalities of gait and mobility  Muscle weakness (generalized)  Unsteadiness on feet  Abnormal posture  Pain in left leg  Phantom limb syndrome with pain [facility])     Problem List Patient Active Problem List   Diagnosis Date Noted   Encounter for loop recorder check 07/26/2021   Cryptogenic stroke ([place]) 05/10/2021   Loop Biotronic [id number] 05/10/2021 05/10/2021   AVM (arteriovenous malformation) 03/20/2021   Pulmonary AV (arteriovenous) fistula ([place]) 02/20/2021   Pre-ulcerative calluses 12/20/2020   Cerebral thrombosis with cerebral infarction 12/11/2020   Cerebrovascular accident (CVA) due to embolism of cerebral artery ([place]) 12/11/2020   Pressure injury of skin 11/12/2020   Acute urinary retention 11/09/2020   Normal anion gap metabolic acidosis 29/93/7169   Hypotension 11/07/2020   Chest pain 10/31/2020   Acute on chronic HFrEF (heart failure with reduced ejection fraction) ([place])    Goals of care, counseling/discussion    Palliative care by specialist    DNR (do not resuscitate)    Critical limb ischemia with history of  revascularization of same extremity ([place]) 10/30/2020   Hyperglycemia 10/30/2020   Tobacco abuse 10/30/2020   Leukocytosis 10/30/2020   Chronic combined systolic and diastolic CHF (congestive heart failure) ([place]) 10/30/2020   Ischemic cardiomyopathy 10/24/2020   AKI (acute kidney injury) ([place]) 10/17/2020   PAD (peripheral artery disease) ([place]) 10/16/2020   Critical lower limb ischemia ([place]) 10/05/2020   Rest pain of left upper extremity due to atherosclerosis ([place]) 10/05/2020   Severe claudication ([place]) 09/20/2020   Paresthesia of both lower extremities 09/20/2020   H/O cardiac arrest 09/20/2020   H/O acute myocardial infarction 08/14/2020   Snoring 08/14/2020   Sinus pause 08/14/2020   Mixed hyperlipidemia 08/14/2020   Tobacco dependence  08/14/2020   Coronary artery disease involving native coronary artery of native heart without angina pectoris 08/10/2020   HFrEF (heart failure with reduced ejection fraction) ([place]) 08/10/2020   Nonrheumatic mitral valve regurgitation 08/10/2020   STEMI (ST elevation myocardial infarction) ([place]) 08/02/2020   Accelerated hypertension 08/01/2020    Jamey Reas, PT, DPT 08/19/2022, 12:50 PM  [facility] [address] [place], [place], [address] Phone: [phone]   Fax:  [phone]  Name: Mike [name] MRN: [id number] Date of Birth: 1961/08/12

## 2022-08-21 DIAGNOSIS — R195 Other fecal abnormalities: Secondary | ICD-10-CM | POA: Diagnosis not present

## 2022-08-21 DIAGNOSIS — I5020 Unspecified systolic (congestive) heart failure: Secondary | ICD-10-CM | POA: Diagnosis not present

## 2022-08-21 DIAGNOSIS — I2510 Atherosclerotic heart disease of native coronary artery without angina pectoris: Secondary | ICD-10-CM | POA: Diagnosis not present

## 2022-08-22 ENCOUNTER — Other Ambulatory Visit: Payer: Self-pay

## 2022-08-22 DIAGNOSIS — I5020 Unspecified systolic (congestive) heart failure: Secondary | ICD-10-CM

## 2022-08-22 LAB — LIPID PANEL
Chol/HDL Ratio: 3.5 ratio (ref 0.0–5.0)
Cholesterol, Total: 109 mg/dL (ref 100–199)
HDL: 31 mg/dL — ABNORMAL LOW (ref >39)
LDL Chol Calc (NIH): 43 mg/dL (ref 0–99)
Triglycerides: 220 mg/dL — ABNORMAL HIGH (ref 0–149)
VLDL Cholesterol Cal: 35 mg/dL (ref 5–40)

## 2022-08-22 LAB — BASIC METABOLIC PANEL
BUN/Creatinine Ratio: 19 (ref 10–24)
BUN: 22 mg/dL (ref 8–27)
CO2: 19 mmol/L — ABNORMAL LOW (ref 20–29)
Calcium: 9.4 mg/dL (ref 8.6–10.2)
Chloride: 106 mmol/L (ref 96–106)
Creatinine, Ser: 1.15 mg/dL (ref 0.76–1.27)
Glucose: 81 mg/dL (ref 70–99)
Potassium: 4.4 mmol/L (ref 3.5–5.2)
Sodium: 141 mmol/L (ref 134–144)
eGFR: 72 mL/min/{1.73_m2} (ref >59)

## 2022-08-22 LAB — PRO B NATRIURETIC PEPTIDE: NT-Pro BNP: 1226 pg/mL — ABNORMAL HIGH (ref 0–210)

## 2022-08-22 MED ORDER — METOPROLOL SUCCINATE ER 25 MG PO TB24: 25.0000 mg | ORAL_TABLET | Freq: Every day | ORAL | 3 refills | Status: DC

## 2022-08-22 MED ORDER — ASPIRIN 81 MG PO TBEC: 81.0000 mg | DELAYED_RELEASE_TABLET | Freq: Every day | ORAL | 3 refills | Status: DC

## 2022-08-27 ENCOUNTER — Other Ambulatory Visit: Payer: Self-pay | Admitting: Cardiology

## 2022-08-27 DIAGNOSIS — I5020 Unspecified systolic (congestive) heart failure: Secondary | ICD-10-CM

## 2022-09-02 ENCOUNTER — Encounter: Payer: Self-pay | Admitting: Physical Therapy

## 2022-09-02 ENCOUNTER — Ambulatory Visit (INDEPENDENT_AMBULATORY_CARE_PROVIDER_SITE_OTHER): Payer: BC Managed Care – PPO | Admitting: Physical Therapy

## 2022-09-02 ENCOUNTER — Ambulatory Visit (INDEPENDENT_AMBULATORY_CARE_PROVIDER_SITE_OTHER): Payer: BC Managed Care – PPO | Admitting: Orthopedic Surgery

## 2022-09-02 DIAGNOSIS — G546 Phantom limb syndrome with pain: Secondary | ICD-10-CM

## 2022-09-02 DIAGNOSIS — Z89612 Acquired absence of left leg above knee: Secondary | ICD-10-CM

## 2022-09-02 DIAGNOSIS — R2689 Other abnormalities of gait and mobility: Secondary | ICD-10-CM | POA: Diagnosis not present

## 2022-09-02 DIAGNOSIS — M6281 Muscle weakness (generalized): Secondary | ICD-10-CM

## 2022-09-02 DIAGNOSIS — M79605 Pain in left leg: Secondary | ICD-10-CM

## 2022-09-02 DIAGNOSIS — R293 Abnormal posture: Secondary | ICD-10-CM

## 2022-09-02 DIAGNOSIS — R2681 Unsteadiness on feet: Secondary | ICD-10-CM | POA: Diagnosis not present

## 2022-09-02 NOTE — Therapy (Signed)
[place] [place], [place], [address] Phone: [phone]   Fax:  [phone]  Physical Therapy Treatment  Patient Details  Name: Mike [name] MRN: [id number] Date of Birth: 1961-06-06 Referring Provider (PT): Meridee Score, MD   Encounter Date: 09/02/2022   PT End of Session - 09/02/22 1141     Visit Number 29    Number of Visits 57    Date for PT Re-Evaluation 06/12/22    Authorization Type BCBS    Authorization Time Period BCBS  Active Policy  Policy Effective  74/11/8785 - 12/28/9998  20% COINSURANCE 12/29/2021 - Open-ended Deductible ($1,000.00)  $0.00 Paid $1,000.00 to go Out-of-Pocket Limit ($5,050.00)  $0.00 Paid $5,050.00 to go    PT Start Time 7672    PT Stop Time 1228    PT Time Calculation (min) 43 min    Equipment Utilized During Treatment Gait belt    Activity Tolerance Patient tolerated treatment well;Patient limited by pain    Behavior During Therapy [facility] for tasks assessed/performed             Past Medical History:  Diagnosis Date   Anxiety    CAD (coronary artery disease)    Cryptogenic stroke ([place]) 05/10/2021   Encounter for loop recorder check 07/26/2021   Heart attack ([place]) 08/01/2020   Heart failure with reduced ejection fraction ([place])    Hyperlipidemia    Hypertension    Loop Biotronic [id number] 05/10/2021 05/10/2021   PAD (peripheral artery disease) ([place])    right CIA & right distal iliac artery stents 10/16/20; left AKA 11/06/20   PFO (patent foramen ovale) 12/14/2020   Pneumonia    Pulmonary AV (arteriovenous) fistula ([place]) 12/14/2020   Stroke ([place]) 12/11/2020   no residuial effects    Past Surgical History:  Procedure Laterality Date   ABDOMINAL AORTOGRAM W/LOWER EXTREMITY N/A 10/16/2020   Procedure: ABDOMINAL AORTOGRAM W/LOWER EXTREMITY;  Surgeon: Nigel Mormon, MD;  Location: [place] [facility];  Service: Cardiovascular;  Laterality: N/A;   AMPUTATION Left 11/06/2020    Procedure: AMPUTATION ABOVE KNEE;  Surgeon: Rosetta Posner, MD;  Location: [place];  Service: Vascular;  Laterality: Left;   BUBBLE STUDY  12/14/2020   Procedure: BUBBLE STUDY;  Surgeon: Nigel Mormon, MD;  Location: [place];  Service: Cardiovascular;;   CARDIAC CATHETERIZATION     CORONARY STENT INTERVENTION N/A 08/03/2020   Procedure: CORONARY STENT INTERVENTION;  Surgeon: Nigel Mormon, MD;  Location: [place] [facility];  Service: Cardiovascular;  Laterality: N/A;   CORONARY/GRAFT ACUTE MI REVASCULARIZATION N/A 08/01/2020   Procedure: Coronary/Graft Acute MI Revascularization;  Surgeon: Nigel Mormon, MD;  Location: [place] [facility];  Service: Cardiovascular;  Laterality: N/A;   INTRAVASCULAR LITHOTRIPSY Right 10/16/2020   Procedure: INTRAVASCULAR LITHOTRIPSY;  Surgeon: Nigel Mormon, MD;  Location: [place] [facility];  Service: Cardiovascular;  Laterality: Right;  Common and External Iliac   IR ANGIOGRAM PULMONARY LEFT SELECTIVE  03/20/2021   IR ANGIOGRAM SELECTIVE EACH ADDITIONAL VESSEL  03/20/2021   IR EMBO ARTERIAL NOT HEMORR HEMANG INC GUIDE ROADMAPPING  03/20/2021   IR RADIOLOGIST EVAL & MGMT  01/30/2021   IR RADIOLOGIST EVAL & MGMT  04/18/2021   IR RADIOLOGIST EVAL & MGMT  07/25/2021   IR US GUIDE VASC ACCESS RIGHT  03/20/2021   LEFT HEART CATH AND CORONARY ANGIOGRAPHY N/A 08/01/2020   Procedure: LEFT HEART CATH AND CORONARY ANGIOGRAPHY;  Surgeon: Nigel Mormon, MD;  Location: [place]  [facility];  Service: Cardiovascular;  Laterality: N/A;   LEFT HEART CATH AND CORONARY ANGIOGRAPHY N/A 08/03/2020   Procedure: LEFT HEART CATH AND CORONARY ANGIOGRAPHY;  Surgeon: Nigel Mormon, MD;  Location: [place] [facility];  Service: Cardiovascular;  Laterality: N/A;   PERIPHERAL VASCULAR INTERVENTION Right 10/16/2020   Procedure: PERIPHERAL VASCULAR INTERVENTION;  Surgeon: Nigel Mormon, MD;  Location: [place] [facility];  Service: Cardiovascular;  Laterality:  Right;  Common and Iliac Stent   RADIOLOGY WITH ANESTHESIA N/A 03/20/2021   Procedure: PULMONARY EMBOLIZATION;  Surgeon: Aletta Edouard, MD;  Location: [place];  Service: Radiology;  Laterality: N/A;   TEE WITHOUT CARDIOVERSION N/A 12/14/2020   Procedure: TRANSESOPHAGEAL ECHOCARDIOGRAM (TEE);  Surgeon: Nigel Mormon, MD;  Location: [facility];  Service: Cardiovascular;  Laterality: N/A;    There were no vitals filed for this visit.   Subjective Assessment - 09/02/22 1142     Subjective He is using cane or nothing in house.  He is using cane (carrying crutch) or single crutch for community activities.  No falls.  He is wearing prosthesis most of awake hours.    Pertinent History PAD, gangrene s/p L AKA 11/06/20, HTN, CAD s/p STEMI with stenting 08/17/2020, and CHF. cryptogenic stroke Dec 2021    Patient Stated Goals to return to work, (he was released from current job over last weekend but wants to find job).  to walk in community,    Currently in Pain? No/denies   in last 2 weeks minimal 1/10   Pain Onset More than a month ago    Pain Onset More than a month ago             Prosthetic Training with Transfemoral Microprocessor Prosthesis: Pt wears most of awake hours reporting only occasional light 1/10 limb pain & no skin issues. Pt ambulated 300' in 2.5 min with cane safely for max tolerable distance with BLE & back fatigue requiring seated rest. Pt neg ramp & curb with cane safely.  He is able to scan right/left & up/down, stepping over 4" tall X 10" width and change speed with cane gait safely. He amb 40' carrying open bottle of water. PT continues to advise short (room to room) with no device working to increase frequency,  long distance (max tolerance) 1x/day with cane or single crutch and 1x/day with 2 crutches.  Medium distance (bw short & long distance) 4+ times / day. Pt verbalized understanding.  He was able to lift & carry foam beam with cane safely.  Self-Care: PT  discussed issues with return to work in that he is limited in standing & gait tolerance and lifting.  PT recommended using a rolling suitcase or cart to transport laundry to/from laundry room on same floor. Pt verbalized understanding.  Pt reports carrying trash downstairs is an issue. PT recommended first trying to descend using rail only with no device in opposite hand. Cane could be dangling on wrist while descending stairs.  Then could add carrying trash in free hand (not on rail). Pt verbalized understanding.                             PT Short Term Goals - 09/02/22 1238       PT SHORT TERM GOAL #1   Title Patient verbalizes understanding of updated HEP / prosthetic activities.    Baseline MET 09/02/2022    Time 3    Period --  visits   Status Achieved    Target Date 09/16/22      PT SHORT TERM GOAL #2   Title patient ambulates 500' with cane, negotiates ramp & curb with supervision.    Baseline Partially MET 09/02/2022 only able to amb up to 300' with cane before LEs fatigue.    Time 3    Period --   visits   Status Partially Met    Target Date 09/16/22      PT SHORT TERM GOAL #3   Title Patient reports prosthesis wear >90% awake hours per day with limb pain </= 2/10    Baseline MET 09/02/2022    Time 3    Period --   visits   Status Achieved    Target Date 07/08/22      PT SHORT TERM GOAL #4   Title patient ambulates 87' without device carrying open bottle of water with no balance losses.    Baseline MET 09/02/2022    Time 3    Period --   visits   Status Achieved    Target Date 09/16/22               PT Long Term Goals - 06/10/22 1249       PT LONG TERM GOAL #1   Title Patient tolerates wear >90% of awake hours without skin issues and limb pain <2/10.    Baseline 06/10/2022 progressing with wear 70-95% of awake hours with limb pain 1/10 to 6.5/10 but only for couple of hours.    Time 8   visits   Period --    Status On-going    Target  Date 10/14/22      PT LONG TERM GOAL #2   Title Timed Up & Go standard with cane and prosthesis < 13.5sec and cognitive TUG <15.5sec.  TUG with prosthesis only <25sec.    Baseline 06/10/2022 improved TUG to 16.94 sec with prosthesis only with supervision.    Time 8   visits   Period --    Status On-going    Target Date 10/14/22      PT LONG TERM GOAL #3   Title dynamic gait index with prosthesis & cane >/= 16/24.    Baseline 06/10/2022 improved DGI to 14/24 with cane    Time 8   visits   Period --    Status On-going    Target Date 10/14/22      PT LONG TERM GOAL #4   Title Patient ambulates 500' with cane & prosthesis modified independent.    Baseline 06/10/2022 improved to ambulate 256' with cane with supervision before fatigues.    Time 8   visits   Period --    Status On-going    Target Date 10/14/22      PT LONG TERM GOAL #5   Title Patient negotiates ramps, curbs & stairs single rail with cane & prosthesis modified independent.    Baseline progressing 06/10/2022 he requires supervision with cane to neg ramps, curbs.  stairs step to pattern with cane & rail safely.    Time 8   visits   Period --    Status On-going    Target Date 10/14/22      PT LONG TERM GOAL #6   Title Patient ambulates 100' carrying <5# item with prosthesis only modified independent.    Baseline 06/10/2022 he ambulates 25' without device with close supervision.    Time 8   visits   Period --  Status On-going    Target Date 10/14/22                   Plan - 09/02/22 1239     Clinical Impression Statement Patient met STGs except distance for gait with cane.  He is slowly improving standing & prosthetic gait but is still limited in frequency & endurance with how long he can stand or walk with less support.    Personal Factors and Comorbidities Comorbidity 3+;Fitness;Time since onset of injury/illness/exacerbation    Comorbidities PAD, gangrene s/p L AKA 11/06/20, HTN, CAD s/p STEMI with  stenting 08/17/2020, and CHF. cryptogenic stroke Dec 2021    Examination-Activity Limitations Lift;Locomotion Level;Squat;Stairs;Stand;Transfers;Carry    Examination-Participation Restrictions Community Activity;Occupation    Stability/Clinical Decision Making Evolving/Moderate complexity    Rehab Potential Good    PT Frequency Biweekly    PT Duration --   8 visits   PT Treatment/Interventions ADLs/Self Care Home Management;DME Instruction;Gait training;Stair training;Functional mobility training;Therapeutic activities;Therapeutic exercise;Balance training;Neuromuscular re-education;Patient/family education;Prosthetic Training;Manual techniques;Passive range of motion;Ultrasound;Vestibular;Other (comment)   dry needling & Physical Performance Testing   PT Next Visit Plan work towards [place],  progress gait with cane in community & no device in home, advise on functional tasks 1x every other week with instruction for progressing activities outside of PT,    Consulted and Agree with Plan of Care Patient             Patient will benefit from skilled therapeutic intervention in order to improve the following deficits and impairments:  Decreased activity tolerance, Decreased balance, Decreased endurance, Decreased knowledge of use of DME, Decreased mobility, Decreased range of motion, Decreased strength, Increased edema, Postural dysfunction, Prosthetic Dependency, Pain, Abnormal gait  Visit Diagnosis: Other abnormalities of gait and mobility  Muscle weakness (generalized)  Unsteadiness on feet  Abnormal posture  Pain in left leg  Phantom limb syndrome with pain [facility])     Problem List Patient Active Problem List   Diagnosis Date Noted   Encounter for loop recorder check 07/26/2021   Cryptogenic stroke ([place]) 05/10/2021   Loop Biotronic [id number] 05/10/2021 05/10/2021   AVM (arteriovenous malformation) 03/20/2021   Pulmonary AV (arteriovenous) fistula ([place]) 02/20/2021   Pre-ulcerative  calluses 12/20/2020   Cerebral thrombosis with cerebral infarction 12/11/2020   Cerebrovascular accident (CVA) due to embolism of cerebral artery ([place]) 12/11/2020   Pressure injury of skin 11/12/2020   Acute urinary retention 11/09/2020   Normal anion gap metabolic acidosis 16/06/3709   Hypotension 11/07/2020   Chest pain 10/31/2020   Acute on chronic HFrEF (heart failure with reduced ejection fraction) ([place])    Goals of care, counseling/discussion    Palliative care by specialist    DNR (do not resuscitate)    Critical limb ischemia with history of revascularization of same extremity ([place]) 10/30/2020   Hyperglycemia 10/30/2020   Tobacco abuse 10/30/2020   Leukocytosis 10/30/2020   Chronic combined systolic and diastolic CHF (congestive heart failure) ([place]) 10/30/2020   Ischemic cardiomyopathy 10/24/2020   AKI (acute kidney injury) ([place]) 10/17/2020   PAD (peripheral artery disease) ([place]) 10/16/2020   Critical lower limb ischemia ([place]) 10/05/2020   Rest pain of left upper extremity due to atherosclerosis ([place]) 10/05/2020   Severe claudication ([place]) 09/20/2020   Paresthesia of both lower extremities 09/20/2020   H/O cardiac arrest 09/20/2020   H/O acute myocardial infarction 08/14/2020   Snoring 08/14/2020   Sinus pause 08/14/2020   Mixed hyperlipidemia 08/14/2020   Tobacco dependence 08/14/2020   Coronary artery  disease involving native coronary artery of native heart without angina pectoris 08/10/2020   HFrEF (heart failure with reduced ejection fraction) ([place]) 08/10/2020   Nonrheumatic mitral valve regurgitation 08/10/2020   STEMI (ST elevation myocardial infarction) ([place]) 08/02/2020   Accelerated hypertension 08/01/2020    Jamey Reas, PT, DPT 09/02/2022, 12:44 PM  [facility] [address] [place], [place], [address] Phone: [phone]   Fax:  [phone]  Name: Mike [name] MRN: [id number] Date of Birth: 1961-05-19

## 2022-09-04 ENCOUNTER — Other Ambulatory Visit: Payer: Self-pay | Admitting: Cardiology

## 2022-09-04 DIAGNOSIS — I5020 Unspecified systolic (congestive) heart failure: Secondary | ICD-10-CM

## 2022-09-09 ENCOUNTER — Encounter: Payer: Self-pay | Admitting: Orthopedic Surgery

## 2022-09-09 NOTE — Progress Notes (Signed)
Office Visit Note   Patient: Mike [name]           Date of Birth: 05-10-1961           MRN: [id number] Visit Date: 09/02/2022              Requested by: Caren Macadam, MD [place],  [place] [address] PCP: Caren Macadam, MD  Chief Complaint  Patient presents with   Left Leg - Follow-up    Left AKA about 2 years ago with Dr. Oneida Alar       HPI: Patient is a 61 year old gentleman who is 2 years status post left above-knee amputation with Dr. Oneida Alar.  Patient has followed up with [facility] for gait training.  He is going to therapy 1 time every 2 weeks.  Assessment & Plan: Visit Diagnoses:  1. Left above-knee amputee [facility])     Plan: Patient is still totally disabled from working.  He still uses [place] crutches he is unsteady with gait and balance.  Follow-Up Instructions: Return if symptoms worsen or fail to improve.   Ortho Exam  Patient is alert, oriented, no adenopathy, well-dressed, normal affect, normal respiratory effort. Examination patient has a new prosthesis on the left adjustments just made to the socket with a good fit there are no open ulcers.  Patient is still unsteady with gait and balance uses his [place] crutches and is still disabled from work.  Imaging: No results found. No images are attached to the encounter.  Labs: Lab Results  Component Value Date   HGBA1C 4.7 (L) 12/12/2020   HGBA1C 7.1 (H) 10/31/2020   HGBA1C 5.8 (H) 08/02/2020   LABURIC 9.1 (H) 10/17/2020   REPTSTATUS 11/12/2020 FINAL 11/11/2020   CULT  11/11/2020    NO GROWTH Performed at [facility], [place] [address], [place], [place] [address]      Lab Results  Component Value Date   ALBUMIN 3.0 (L) 12/11/2020   ALBUMIN 1.8 (L) 11/15/2020   ALBUMIN 1.8 (L) 11/14/2020    Lab Results  Component Value Date   MG 1.7 11/11/2020   MG 1.8 11/10/2020   MG 2.4 10/31/2020   No results found for: "VD25OH"  No results found for: "PREALBUMIN"    Latest  Ref Rng & Units 03/20/2021    6:49 AM 12/14/2020    4:50 AM 12/12/2020    1:30 AM  CBC EXTENDED  WBC 4.0 - 10.5 K/uL 8.3  7.5  8.2   RBC 4.22 - 5.81 MIL/uL 5.01  3.92  3.89   Hemoglobin 13.0 - 17.0 g/dL 13.5  11.6  11.0   HCT 39.0 - 52.0 % 42.1  35.5  36.3   Platelets 150 - 400 K/uL 212  202  214      There is no height or weight on file to calculate BMI.  Orders:  No orders of the defined types were placed in this encounter.  No orders of the defined types were placed in this encounter.    Procedures: No procedures performed  Clinical Data: No additional findings.  ROS:  All other systems negative, except as noted in the HPI. Review of Systems  Objective: Vital Signs: There were no vitals taken for this visit.  Specialty Comments:  No specialty comments available.  PMFS History: Patient Active Problem List   Diagnosis Date Noted   Encounter for loop recorder check 07/26/2021   Cryptogenic stroke ([place]) 05/10/2021   Loop Biotronic [id number] 05/10/2021 05/10/2021  AVM (arteriovenous malformation) 03/20/2021   Pulmonary AV (arteriovenous) fistula ([place]) 02/20/2021   Pre-ulcerative calluses 12/20/2020   Cerebral thrombosis with cerebral infarction 12/11/2020   Cerebrovascular accident (CVA) due to embolism of cerebral artery ([place]) 12/11/2020   Pressure injury of skin 11/12/2020   Acute urinary retention 11/09/2020   Normal anion gap metabolic acidosis 21/19/4174   Hypotension 11/07/2020   Chest pain 10/31/2020   Acute on chronic HFrEF (heart failure with reduced ejection fraction) ([place])    Goals of care, counseling/discussion    Palliative care by specialist    DNR (do not resuscitate)    Critical limb ischemia with history of revascularization of same extremity ([place]) 10/30/2020   Hyperglycemia 10/30/2020   Tobacco abuse 10/30/2020   Leukocytosis 10/30/2020   Chronic combined systolic and diastolic CHF (congestive heart failure) ([place]) 10/30/2020   Ischemic  cardiomyopathy 10/24/2020   AKI (acute kidney injury) ([place]) 10/17/2020   PAD (peripheral artery disease) ([place]) 10/16/2020   Critical lower limb ischemia ([place]) 10/05/2020   Rest pain of left upper extremity due to atherosclerosis ([place]) 10/05/2020   Severe claudication ([place]) 09/20/2020   Paresthesia of both lower extremities 09/20/2020   H/O cardiac arrest 09/20/2020   H/O acute myocardial infarction 08/14/2020   Snoring 08/14/2020   Sinus pause 08/14/2020   Mixed hyperlipidemia 08/14/2020   Tobacco dependence 08/14/2020   Coronary artery disease involving native coronary artery of native heart without angina pectoris 08/10/2020   HFrEF (heart failure with reduced ejection fraction) ([place]) 08/10/2020   Nonrheumatic mitral valve regurgitation 08/10/2020   STEMI (ST elevation myocardial infarction) ([place]) 08/02/2020   Accelerated hypertension 08/01/2020   Past Medical History:  Diagnosis Date   Anxiety    CAD (coronary artery disease)    Cryptogenic stroke ([place]) 05/10/2021   Encounter for loop recorder check 07/26/2021   Heart attack ([place]) 08/01/2020   Heart failure with reduced ejection fraction ([place])    Hyperlipidemia    Hypertension    Loop Biotronic [id number] 05/10/2021 05/10/2021   PAD (peripheral artery disease) ([place])    right CIA & right distal iliac artery stents 10/16/20; left AKA 11/06/20   PFO (patent foramen ovale) 12/14/2020   Pneumonia    Pulmonary AV (arteriovenous) fistula ([place]) 12/14/2020   Stroke ([place]) 12/11/2020   no residuial effects    Family History  Problem Relation Age of Onset   Heart disease Mother    Heart disease Father    Cancer Brother     Past Surgical History:  Procedure Laterality Date   ABDOMINAL AORTOGRAM W/LOWER EXTREMITY N/A 10/16/2020   Procedure: ABDOMINAL AORTOGRAM W/LOWER EXTREMITY;  Surgeon: Nigel Mormon, MD;  Location: [place] [facility];  Service: Cardiovascular;  Laterality: N/A;   AMPUTATION Left 11/06/2020   Procedure:  AMPUTATION ABOVE KNEE;  Surgeon: Rosetta Posner, MD;  Location: [place];  Service: Vascular;  Laterality: Left;   BUBBLE STUDY  12/14/2020   Procedure: BUBBLE STUDY;  Surgeon: Nigel Mormon, MD;  Location: [place];  Service: Cardiovascular;;   CARDIAC CATHETERIZATION     CORONARY STENT INTERVENTION N/A 08/03/2020   Procedure: CORONARY STENT INTERVENTION;  Surgeon: Nigel Mormon, MD;  Location: [place] [facility];  Service: Cardiovascular;  Laterality: N/A;   CORONARY/GRAFT ACUTE MI REVASCULARIZATION N/A 08/01/2020   Procedure: Coronary/Graft Acute MI Revascularization;  Surgeon: Nigel Mormon, MD;  Location: [place] [facility];  Service: Cardiovascular;  Laterality: N/A;   INTRAVASCULAR LITHOTRIPSY Right 10/16/2020   Procedure: INTRAVASCULAR LITHOTRIPSY;  Surgeon: Nigel Mormon, MD;  Location: [place] [facility];  Service: Cardiovascular;  Laterality: Right;  Common and External Iliac   IR ANGIOGRAM PULMONARY LEFT SELECTIVE  03/20/2021   IR ANGIOGRAM SELECTIVE EACH ADDITIONAL VESSEL  03/20/2021   IR EMBO ARTERIAL NOT HEMORR HEMANG INC GUIDE ROADMAPPING  03/20/2021   IR RADIOLOGIST EVAL & MGMT  01/30/2021   IR RADIOLOGIST EVAL & MGMT  04/18/2021   IR RADIOLOGIST EVAL & MGMT  07/25/2021   IR US GUIDE VASC ACCESS RIGHT  03/20/2021   LEFT HEART CATH AND CORONARY ANGIOGRAPHY N/A 08/01/2020   Procedure: LEFT HEART CATH AND CORONARY ANGIOGRAPHY;  Surgeon: Nigel Mormon, MD;  Location: [place] [facility];  Service: Cardiovascular;  Laterality: N/A;   LEFT HEART CATH AND CORONARY ANGIOGRAPHY N/A 08/03/2020   Procedure: LEFT HEART CATH AND CORONARY ANGIOGRAPHY;  Surgeon: Nigel Mormon, MD;  Location: [place] [facility];  Service: Cardiovascular;  Laterality: N/A;   PERIPHERAL VASCULAR INTERVENTION Right 10/16/2020   Procedure: PERIPHERAL VASCULAR INTERVENTION;  Surgeon: Nigel Mormon, MD;  Location: [place] [facility];  Service: Cardiovascular;  Laterality: Right;   Common and Iliac Stent   RADIOLOGY WITH ANESTHESIA N/A 03/20/2021   Procedure: PULMONARY EMBOLIZATION;  Surgeon: Aletta Edouard, MD;  Location: [place];  Service: Radiology;  Laterality: N/A;   TEE WITHOUT CARDIOVERSION N/A 12/14/2020   Procedure: TRANSESOPHAGEAL ECHOCARDIOGRAM (TEE);  Surgeon: Nigel Mormon, MD;  Location: [facility] ENDOSCOPY;  Service: Cardiovascular;  Laterality: N/A;   Social History   Occupational History   Occupation: Disability  Tobacco Use   Smoking status: Some Days    Packs/day: 0.50    Years: 40.00    Total pack years: 20.00    Types: Cigarettes   Smokeless tobacco: Never   Tobacco comments:    3 - 7 a day  Vaping Use   Vaping Use: Never used  Substance and Sexual Activity   Alcohol use: Yes    Comment: ocassional since MI    Drug use: Never   Sexual activity: Not on file

## 2022-09-10 ENCOUNTER — Other Ambulatory Visit: Payer: Self-pay | Admitting: Gastroenterology

## 2022-09-10 ENCOUNTER — Encounter: Payer: Self-pay | Admitting: Cardiology

## 2022-09-12 ENCOUNTER — Other Ambulatory Visit: Payer: Self-pay

## 2022-09-12 ENCOUNTER — Encounter (HOSPITAL_COMMUNITY): Payer: Self-pay | Admitting: Gastroenterology

## 2022-09-12 NOTE — Progress Notes (Signed)
Spoke with Building services engineer . Per Janett Billow Ward PA-C pt. Needs cardiac clearance. Per Sherri pt. Has a clearance and they will fax to ENDO department at [facility] and PAT.

## 2022-09-16 ENCOUNTER — Other Ambulatory Visit: Payer: Self-pay

## 2022-09-16 ENCOUNTER — Ambulatory Visit (HOSPITAL_COMMUNITY): Payer: BC Managed Care – PPO | Admitting: Physician Assistant

## 2022-09-16 ENCOUNTER — Ambulatory Visit (HOSPITAL_COMMUNITY)
Admission: RE | Admit: 2022-09-16 | Discharge: 2022-09-16 | Disposition: A | Discharge status: Home / Self Care | Payer: BC Managed Care – PPO | Attending: Gastroenterology | Admitting: Gastroenterology

## 2022-09-16 ENCOUNTER — Encounter: Payer: BC Managed Care – PPO | Admitting: Physical Therapy

## 2022-09-16 ENCOUNTER — Encounter (HOSPITAL_COMMUNITY): Payer: Self-pay | Admitting: Gastroenterology

## 2022-09-16 ENCOUNTER — Encounter (HOSPITAL_COMMUNITY)
Admission: RE | Disposition: A | Discharge status: Home / Self Care | Payer: Self-pay | Source: Home / Self Care | Attending: Gastroenterology

## 2022-09-16 DIAGNOSIS — D124 Benign neoplasm of descending colon: Secondary | ICD-10-CM | POA: Diagnosis not present

## 2022-09-16 DIAGNOSIS — K648 Other hemorrhoids: Secondary | ICD-10-CM | POA: Insufficient documentation

## 2022-09-16 DIAGNOSIS — R195 Other fecal abnormalities: Secondary | ICD-10-CM | POA: Diagnosis not present

## 2022-09-16 DIAGNOSIS — D125 Benign neoplasm of sigmoid colon: Secondary | ICD-10-CM | POA: Diagnosis not present

## 2022-09-16 DIAGNOSIS — Z89612 Acquired absence of left leg above knee: Secondary | ICD-10-CM | POA: Diagnosis not present

## 2022-09-16 DIAGNOSIS — F17210 Nicotine dependence, cigarettes, uncomplicated: Secondary | ICD-10-CM | POA: Insufficient documentation

## 2022-09-16 DIAGNOSIS — I252 Old myocardial infarction: Secondary | ICD-10-CM | POA: Insufficient documentation

## 2022-09-16 DIAGNOSIS — K644 Residual hemorrhoidal skin tags: Secondary | ICD-10-CM | POA: Diagnosis not present

## 2022-09-16 DIAGNOSIS — I2510 Atherosclerotic heart disease of native coronary artery without angina pectoris: Secondary | ICD-10-CM | POA: Insufficient documentation

## 2022-09-16 DIAGNOSIS — D122 Benign neoplasm of ascending colon: Secondary | ICD-10-CM | POA: Insufficient documentation

## 2022-09-16 DIAGNOSIS — I509 Heart failure, unspecified: Secondary | ICD-10-CM | POA: Diagnosis not present

## 2022-09-16 DIAGNOSIS — Z8674 Personal history of sudden cardiac arrest: Secondary | ICD-10-CM | POA: Insufficient documentation

## 2022-09-16 DIAGNOSIS — I110 Hypertensive heart disease with heart failure: Secondary | ICD-10-CM | POA: Insufficient documentation

## 2022-09-16 DIAGNOSIS — K6389 Other specified diseases of intestine: Secondary | ICD-10-CM | POA: Insufficient documentation

## 2022-09-16 DIAGNOSIS — K5730 Diverticulosis of large intestine without perforation or abscess without bleeding: Secondary | ICD-10-CM | POA: Diagnosis not present

## 2022-09-16 DIAGNOSIS — Z7901 Long term (current) use of anticoagulants: Secondary | ICD-10-CM | POA: Diagnosis not present

## 2022-09-16 DIAGNOSIS — Z1211 Encounter for screening for malignant neoplasm of colon: Secondary | ICD-10-CM | POA: Diagnosis not present

## 2022-09-16 DIAGNOSIS — I739 Peripheral vascular disease, unspecified: Secondary | ICD-10-CM | POA: Diagnosis not present

## 2022-09-16 DIAGNOSIS — K5520 Angiodysplasia of colon without hemorrhage: Secondary | ICD-10-CM | POA: Insufficient documentation

## 2022-09-16 DIAGNOSIS — Z8673 Personal history of transient ischemic attack (TIA), and cerebral infarction without residual deficits: Secondary | ICD-10-CM | POA: Insufficient documentation

## 2022-09-16 DIAGNOSIS — K635 Polyp of colon: Secondary | ICD-10-CM | POA: Diagnosis not present

## 2022-09-16 HISTORY — DX: Heart failure, unspecified: I50.9

## 2022-09-16 HISTORY — PX: COLONOSCOPY WITH PROPOFOL: SHX5780

## 2022-09-16 HISTORY — DX: Prediabetes: R73.03

## 2022-09-16 HISTORY — PX: COLONOSCOPY: SHX174

## 2022-09-16 HISTORY — PX: BIOPSY: SHX5522

## 2022-09-16 HISTORY — DX: Presence of other cardiac implants and grafts: Z95.818

## 2022-09-16 HISTORY — PX: POLYPECTOMY: SHX5525

## 2022-09-16 SURGERY — COLONOSCOPY WITH PROPOFOL
Anesthesia: Monitor Anesthesia Care

## 2022-09-16 MED ORDER — PROPOFOL 500 MG/50ML IV EMUL
INTRAVENOUS | Status: DC | PRN
  Administered 2022-09-16: 120 ug/kg/min via INTRAVENOUS

## 2022-09-16 MED ORDER — PROPOFOL 10 MG/ML IV BOLUS
INTRAVENOUS | Status: DC | PRN
  Administered 2022-09-16: 60 mg via INTRAVENOUS

## 2022-09-16 MED ORDER — PHENYLEPHRINE 80 MCG/ML (10ML) SYRINGE FOR IV PUSH (FOR BLOOD PRESSURE SUPPORT)
PREFILLED_SYRINGE | INTRAVENOUS | Status: DC | PRN
  Administered 2022-09-16: 160 ug via INTRAVENOUS

## 2022-09-16 MED ORDER — LACTATED RINGERS IV SOLN: INTRAVENOUS | Status: DC | PRN

## 2022-09-16 SURGICAL SUPPLY — 22 items

## 2022-09-16 NOTE — Anesthesia Preprocedure Evaluation (Addendum)
Anesthesia Evaluation  Patient identified by MRN, date of birth, ID band Patient awake    Reviewed: Allergy & Precautions, NPO status , Patient's Chart, lab work & pertinent test results  Airway Mallampati: II  TM Distance: >3 FB Neck ROM: Full    Dental no notable dental hx.    Pulmonary neg pulmonary ROS, Current Smoker and Patient abstained from smoking.,    Pulmonary exam normal        Cardiovascular hypertension, Pt. on medications and Pt. on home beta blockers + CAD, + Past MI, + Peripheral Vascular Disease and +CHF   Rhythm:Regular Rate:Normal  ECHO 06/23: Echocardiogram 06/25/2022:  Left ventricle cavity is severely dilated. Normal left ventricular wall  thickness. Moderate inferolateral hypokinesis, mild global hypokinesis.  LVEF 35-40%. Doppler evidence of grade I (impaired) diastolic dysfunction,  normal LAP.  Mild to moderate mitral regurgitation.  Mild tricuspid regurgitation. Estimated pulmonary artery systolic pressure  41 mmHg.  Previous study on 09/10/2021 noted mild LV dilatation, LVEF 40-45%, mod MR,  no PH.   Neuro/Psych Anxiety CVA, No Residual Symptoms    GI/Hepatic negative GI ROS, Neg liver ROS,   Endo/Other  negative endocrine ROS  Renal/GU   negative genitourinary   Musculoskeletal negative musculoskeletal ROS (+)   Abdominal Normal abdominal exam  (+)   Peds  Hematology negative hematology ROS (+)   Anesthesia Other Findings   Reproductive/Obstetrics                            Anesthesia Physical Anesthesia Plan  ASA: 3  Anesthesia Plan: MAC   Post-op Pain Management:    Induction: Intravenous  PONV Risk Score and Plan: Propofol infusion and Treatment may vary due to age or medical condition  Airway Management Planned: Natural Airway, Simple Face Mask and Nasal Cannula  Additional Equipment: None  Intra-op Plan:   Post-operative Plan:   Informed  Consent: I have reviewed the patients History and Physical, chart, labs and discussed the procedure including the risks, benefits and alternatives for the proposed anesthesia with the patient or authorized representative who has indicated his/her understanding and acceptance.     Dental advisory given  Plan Discussed with: CRNA  Anesthesia Plan Comments:         Anesthesia Quick Evaluation

## 2022-09-16 NOTE — H&P (Signed)
Primary Care Physician:  Caren Macadam, MD Primary Gastroenterologist:  [facility]  Reason for Visit -outpatient colonoscopy  HPI: Mike [name] is a 61 y.o. male here for outpatient colonoscopy for positive fit test.  Last colonoscopy at age 51 at outside place.  Report not available to review.  Patient with personal history of coronary artery disease s/p PCI in August 2021, history of cardiac arrest secondary to torsades post PCI, history of peripheral arterial disease , status post left AKA amputation for critical limb ischemia, history of multiple stroke s/p tPA with revascularization and December 2021, history of large AV pulmonary fistula, use of anticoagulation with Xarelto.  Xarelto on hold for last 2 days.  Patient denies any GI symptoms.  Denies any acute cardiac symptoms.  Denies seeing any blood in the stool or black stool.  Denies abdominal pain, nausea and vomiting.  Past Medical History:  Diagnosis Date   Anxiety    CAD (coronary artery disease)    CHF (congestive heart failure) ([place])    Cryptogenic stroke ([place]) 05/10/2021   Encounter for loop recorder check 07/26/2021   Heart attack ([place]) 08/01/2020   Heart failure with reduced ejection fraction ([place])    Hyperlipidemia    Hypertension    Implantable loop recorder present    [employer] [id number] 05/10/2021 05/10/2021   PAD (peripheral artery disease) ([place])    right CIA & right distal iliac artery stents 10/16/20; left AKA 11/06/20   PFO (patent foramen ovale) 12/14/2020   Pneumonia    Pre-diabetes    Pulmonary AV (arteriovenous) fistula ([place]) 12/14/2020   Stroke ([place]) 12/11/2020   no residuial effects    Past Surgical History:  Procedure Laterality Date   ABDOMINAL AORTOGRAM W/LOWER EXTREMITY N/A 10/16/2020   Procedure: ABDOMINAL AORTOGRAM W/LOWER EXTREMITY;  Surgeon: Nigel Mormon, MD;  Location: [place] [facility];  Service: Cardiovascular;  Laterality: N/A;   AMPUTATION Left 11/06/2020    Procedure: AMPUTATION ABOVE KNEE;  Surgeon: Rosetta Posner, MD;  Location: [place];  Service: Vascular;  Laterality: Left;   BUBBLE STUDY  12/14/2020   Procedure: BUBBLE STUDY;  Surgeon: Nigel Mormon, MD;  Location: [place];  Service: Cardiovascular;;   CARDIAC CATHETERIZATION     CORONARY STENT INTERVENTION N/A 08/03/2020   Procedure: CORONARY STENT INTERVENTION;  Surgeon: Nigel Mormon, MD;  Location: [place] [facility];  Service: Cardiovascular;  Laterality: N/A;   CORONARY/GRAFT ACUTE MI REVASCULARIZATION N/A 08/01/2020   Procedure: Coronary/Graft Acute MI Revascularization;  Surgeon: Nigel Mormon, MD;  Location: [place] [facility];  Service: Cardiovascular;  Laterality: N/A;   INTRAVASCULAR LITHOTRIPSY Right 10/16/2020   Procedure: INTRAVASCULAR LITHOTRIPSY;  Surgeon: Nigel Mormon, MD;  Location: [place] [facility];  Service: Cardiovascular;  Laterality: Right;  Common and External Iliac   IR ANGIOGRAM PULMONARY LEFT SELECTIVE  03/20/2021   IR ANGIOGRAM SELECTIVE EACH ADDITIONAL VESSEL  03/20/2021   IR EMBO ARTERIAL NOT HEMORR HEMANG INC GUIDE ROADMAPPING  03/20/2021   IR RADIOLOGIST EVAL & MGMT  01/30/2021   IR RADIOLOGIST EVAL & MGMT  04/18/2021   IR RADIOLOGIST EVAL & MGMT  07/25/2021   IR US GUIDE VASC ACCESS RIGHT  03/20/2021   LEFT HEART CATH AND CORONARY ANGIOGRAPHY N/A 08/01/2020   Procedure: LEFT HEART CATH AND CORONARY ANGIOGRAPHY;  Surgeon: Nigel Mormon, MD;  Location: [place] [facility];  Service: Cardiovascular;  Laterality: N/A;   LEFT HEART CATH AND CORONARY ANGIOGRAPHY N/A 08/03/2020   Procedure:  LEFT HEART CATH AND CORONARY ANGIOGRAPHY;  Surgeon: Nigel Mormon, MD;  Location: [place] [facility];  Service: Cardiovascular;  Laterality: N/A;   PERIPHERAL VASCULAR INTERVENTION Right 10/16/2020   Procedure: PERIPHERAL VASCULAR INTERVENTION;  Surgeon: Nigel Mormon, MD;  Location: [place] [facility];  Service: Cardiovascular;  Laterality:  Right;  Common and Iliac Stent   RADIOLOGY WITH ANESTHESIA N/A 03/20/2021   Procedure: PULMONARY EMBOLIZATION;  Surgeon: Aletta Edouard, MD;  Location: [place];  Service: Radiology;  Laterality: N/A;   TEE WITHOUT CARDIOVERSION N/A 12/14/2020   Procedure: TRANSESOPHAGEAL ECHOCARDIOGRAM (TEE);  Surgeon: Nigel Mormon, MD;  Location: [facility];  Service: Cardiovascular;  Laterality: N/A;    Prior to Admission medications   Medication Sig Start Date End Date Taking? Authorizing Provider  acetaminophen (TYLENOL) 500 MG tablet Take 500-1,000 mg by mouth every 6 (six) hours as needed (pain).   Yes [provider]  aspirin EC 81 MG tablet Take 1 tablet (81 mg total) by mouth daily. Swallow whole. 08/22/22  Yes Patwardhan, Manish J, MD  DULoxetine (CYMBALTA) 30 MG capsule TAKE 1 CAPSULE BY MOUTH EVERY DAY 04/21/22  Yes Newt Minion, MD  ezetimibe (ZETIA) 10 MG tablet Take 1 tablet (10 mg total) by mouth daily. 02/20/22 09/16/22 Yes Patwardhan, Manish J, MD  FARXIGA 10 MG TABS tablet TAKE 1 TABLET(10 MG) BY MOUTH DAILY BEFORE BREAKFAST 09/04/22  Yes Patwardhan, Manish J, MD  furosemide (LASIX) 20 MG tablet TAKE 1 TABLET BY MOUTH EVERY DAY 08/29/22  Yes Patwardhan, Manish J, MD  gabapentin (NEURONTIN) 300 MG capsule Take 1 capsule (300 mg total) by mouth 2 (two) times daily. Patient taking differently: Take 300 mg by mouth in the morning and at bedtime. 11/15/20  Yes Little Ishikawa, MD  icosapent Ethyl (VASCEPA) 1 g capsule Take 2 capsules (2 g total) by mouth 2 (two) times daily. 08/14/22  Yes Patwardhan, Manish J, MD  metoprolol succinate (TOPROL-XL) 25 MG 24 hr tablet Take 1 tablet (25 mg total) by mouth daily. Take with or immediately following a meal. 08/22/22 08/17/23 Yes Patwardhan, Reynold Bowen, MD  Multiple Vitamins-Minerals (MULTI FOR HIM 50+) TABS Take 1 tablet by mouth See admin instructions.   Yes [provider]  nitroGLYCERIN (NITROSTAT) 0.4 MG SL tablet Place 1 tablet  (0.4 mg total) under the tongue every 5 (five) minutes as needed for chest pain. 07/03/22 10/01/22 Yes Patwardhan, Reynold Bowen, MD  rivaroxaban (XARELTO) 2.5 MG TABS tablet Take 1 tablet (2.5 mg total) by mouth 2 (two) times daily. 08/05/22  Yes Patwardhan, Manish J, MD  rosuvastatin (CRESTOR) 40 MG tablet Take 1 tablet (40 mg total) by mouth every evening. 03/14/22  Yes Patwardhan, Manish J, MD  sacubitril-valsartan (ENTRESTO) 49-51 MG Take 1 tablet by mouth 2 (two) times daily. 08/05/22  Yes Patwardhan, Manish J, MD  spironolactone (ALDACTONE) 25 MG tablet Take 0.5 tablets (12.5 mg total) by mouth daily. Patient not taking: Reported on 09/11/2022 08/14/22 04/11/23  Nigel Mormon, MD    Scheduled Meds: Continuous Infusions: PRN Meds:.  Allergies as of 09/10/2022   (No Known Allergies)    Family History  Problem Relation Age of Onset   Heart disease Mother    Heart disease Father    Cancer Brother     Social History   Socioeconomic History   Marital status: Single    Spouse name: Not on file   Number of children: 1   Years of education: BA  Highest education level: Not on file  Occupational History   Occupation: Disability  Tobacco Use   Smoking status: Every Day    Packs/day: 0.50    Years: 40.00    Total pack years: 20.00    Types: Cigarettes   Smokeless tobacco: Never   Tobacco comments:    3 - 7 a day  Vaping Use   Vaping Use: Never used  Substance and Sexual Activity   Alcohol use: Yes    Comment: ocassional since MI   2-3 x a week   Drug use: Never   Sexual activity: Not Currently  Other Topics Concern   Not on file  Social History Narrative   Left handed   Caffeine use: 3-4 sodas per day, tea very rare.    Social Determinants of Health   Financial Resource Strain: Not on file  Food Insecurity: Not on file  Transportation Needs: Not on file  Physical Activity: Not on file  Stress: Not on file  Social Connections: Not on file  Intimate Partner Violence:  Not on file     Physical Exam: Vital signs: Vitals:   09/16/22 0817  BP: 132/83  Pulse: 76  Resp: 12  Temp: 98.3 F (36.8 C)  SpO2: 94%     General:   Alert,  Well-developed, well-nourished, pleasant and cooperative in NAD Lungs: No visible respiratory distress, anterior exam only Heart:  Regular rate and rhythm; murmur noted  Abdomen: Soft, nontender, nondistended, bowel sounds present, no peritoneal signs Rectal:  Deferred  GI:  Lab Results: No results for input(s): "WBC", "HGB", "HCT", "PLT" in the last 72 hours. BMET No results for input(s): "NA", "K", "CL", "CO2", "GLUCOSE", "BUN", "CREATININE", "CALCIUM" in the last 72 hours. LFT No results for input(s): "PROT", "ALBUMIN", "AST", "ALT", "ALKPHOS", "BILITOT", "BILIDIR", "IBILI" in the last 72 hours. PT/INR No results for input(s): "LABPROT", "INR" in the last 72 hours.   Studies/Results: No results found.  Impression/Plan: -Positive fit test -Use of anticoagulation.  Xarelto on hold for 2 days -History of coronary artery disease, peripheral arterial disease, CVA, left AKA for limb ischemia  Recommendations -------------------------- - Proceed with colonoscopy today.  Risks (bleeding, infection, bowel perforation that could require surgery, sedation-related changes in cardiopulmonary systems), benefits (identification and possible treatment of source of symptoms, exclusion of certain causes of symptoms), and alternatives (watchful waiting, radiographic imaging studies, empiric medical treatment)  were explained to patient/family in detail and patient wishes to proceed.     LOS: 0 days   Otis Brace  MD, FACP 09/16/2022, 8:44 AM  Contact #  [phone]

## 2022-09-16 NOTE — Op Note (Signed)
[facility] Patient Name: Mike [name] Procedure Date: 09/16/2022 MRN: [id number] Attending MD: Otis Brace , MD Date of Birth: 08-24-1961 CSN: [id number] Age: 61 Admit Type: Outpatient Procedure:                Colonoscopy Indications:              Positive fecal immunochemical test Providers:                Otis Brace, MD, Allayne Gitelman, RN, Frazier Richards,                            Technician Referring MD:              Medicines:                Sedation Administered by an Anesthesia Professional Complications:            No immediate complications. Estimated Blood Loss:     Estimated blood loss was minimal. Procedure:                Pre-Anesthesia Assessment:                           - Prior to the procedure, a History and Physical                            was performed, and patient medications and                            allergies were reviewed. The patient's tolerance of                            previous anesthesia was also reviewed. The risks                            and benefits of the procedure and the sedation                            options and risks were discussed with the patient.                            All questions were answered, and informed consent                            was obtained. Prior Anticoagulants: The patient has                            taken Xarelto (rivaroxaban), last dose was 2 days                            prior to procedure. ASA Grade Assessment: III - A                            patient with severe systemic disease. After  reviewing the risks and benefits, the patient was                            deemed in satisfactory condition to undergo the                            procedure.                           After obtaining informed consent, the colonoscope                            was passed under direct vision. Throughout the                            procedure, the  patient's blood pressure, pulse, and                            oxygen saturations were monitored continuously. The                            PCF-HQ190L ([id number] Olympus colonoscope was                            introduced through the anus and advanced to the the                            terminal ileum, with identification of the                            appendiceal orifice and IC valve. The colonoscopy                            was performed without difficulty. The patient                            tolerated the procedure well. The quality of the                            bowel preparation was adequate to identify polyps 6                            mm and larger in size. Scope In: 9:02:32 AM Scope Out: 9:25:37 AM Scope Withdrawal Time: 0 hours 17 minutes 52 seconds  Total Procedure Duration: 0 hours 23 minutes 5 seconds  Findings:      Skin tags were found on perianal exam.      The terminal ileum appeared normal.      Two large localized angioectasias without bleeding were found in the       ascending colon.      Three sessile polyps were found in the ascending colon. The polyps were       4 to 10 mm in size. These polyps were removed with a cold snare.       Resection and retrieval were complete.  Two sessile polyps were found in the descending colon. The polyps were 6       to 8 mm in size. These polyps were removed with a cold snare. Resection       and retrieval were complete.      Three sessile polyps were found in the sigmoid colon. The polyps were 5       to 7 mm in size. These polyps were removed with a cold snare. Resection       and retrieval were complete.      A segmental area of mildly altered vascular and atrophic mucosa was       found in the proximal sigmoid colon. Biopsies were taken with a cold       forceps for histology.      Diverticula were found in the sigmoid colon.      Internal hemorrhoids were found during retroflexion. The hemorrhoids        were small. Impression:               - Perianal skin tags found on perianal exam.                           - The examined portion of the ileum was normal.                           - Two non-bleeding colonic angioectasias.                           - Three 4 to 10 mm polyps in the ascending colon,                            removed with a cold snare. Resected and retrieved.                           - Two 6 to 8 mm polyps in the descending colon,                            removed with a cold snare. Resected and retrieved.                           - Three 5 to 7 mm polyps in the sigmoid colon,                            removed with a cold snare. Resected and retrieved.                           - Altered vascular and atrophic mucosa in the                            proximal sigmoid colon. Biopsied.                           - Diverticulosis in the sigmoid colon.                           - Internal hemorrhoids. Moderate  Sedation:      Moderate (conscious) sedation was personally administered by an       anesthesia professional. The following parameters were monitored: oxygen       saturation, heart rate, blood pressure, and response to care. Recommendation:           - Patient has a contact number available for                            emergencies. The signs and symptoms of potential                            delayed complications were discussed with the                            patient. Return to normal activities tomorrow.                            Written discharge instructions were provided to the                            patient.                           - Resume previous diet.                           - Continue present medications.                           - Await pathology results.                           - Repeat colonoscopy date to be determined after                            pending pathology results are reviewed for                            surveillance  of multiple polyps.                           - Resume Xarelto (rivaroxaban) at prior dose                            tomorrow. Procedure Code(s):        --- Professional ---                           [phone], Colonoscopy, flexible; with removal of                            tumor(s), polyp(s), or other lesion(s) by snare                            technique  [id number], 35, Colonoscopy, flexible; with biopsy,                            single or multiple Diagnosis Code(s):        --- Professional ---                           K64.8, Other hemorrhoids                           K55.20, Angiodysplasia of colon without hemorrhage                           K63.5, Polyp of colon                           K63.89, Other specified diseases of intestine                           K64.4, Residual hemorrhoidal skin tags                           R19.5, Other fecal abnormalities                           K57.30, Diverticulosis of large intestine without                            perforation or abscess without bleeding CPT copyright 2019 American Medical Association. All rights reserved. The codes documented in this report are preliminary and upon coder review may  be revised to meet current compliance requirements. Otis Brace, MD Otis Brace, MD 09/16/2022 9:40:15 AM Number of Addenda: 0

## 2022-09-16 NOTE — Discharge Instructions (Signed)

## 2022-09-16 NOTE — Anesthesia Postprocedure Evaluation (Signed)
Anesthesia Post Note  Patient: Mike [name]  Procedure(s) Performed: COLONOSCOPY WITH PROPOFOL POLYPECTOMY BIOPSY     Patient location during evaluation: Endoscopy Anesthesia Type: MAC Level of consciousness: awake and alert Pain management: pain level controlled Vital Signs Assessment: post-procedure vital signs reviewed and stable Respiratory status: spontaneous breathing, nonlabored ventilation, respiratory function stable and patient connected to nasal cannula oxygen Cardiovascular status: stable and blood pressure returned to baseline Postop Assessment: no apparent nausea or vomiting Anesthetic complications: no   No notable events documented.  Last Vitals:  Vitals:   09/16/22 0950 09/16/22 0958  BP: 134/68 (!) 135/45  Pulse: 70 63  Resp: 20 16  Temp:    SpO2: 96% 98%    Last Pain:  Vitals:   09/16/22 0958  TempSrc:   PainSc: 0-No pain                 March Rummage Shyasia Funches

## 2022-09-16 NOTE — Transfer of Care (Signed)
Immediate Anesthesia Transfer of Care Note  Patient: Mike [name]  Procedure(s) Performed: COLONOSCOPY WITH PROPOFOL POLYPECTOMY BIOPSY  Patient Location: PACU and Endoscopy Unit  Anesthesia Type:MAC  Level of Consciousness: awake, alert  and oriented  Airway & Oxygen Therapy: Patient Spontanous Breathing  Post-op Assessment: Report given to RN and Post -op Vital signs reviewed and stable  Post vital signs: Reviewed and stable  Last Vitals:  Vitals Value Taken Time  BP 99/64 09/16/22 0934  Temp    Pulse 85 09/16/22 0936  Resp 18 09/16/22 0936  SpO2 92 % 09/16/22 0936  Vitals shown include unvalidated device data.  Last Pain:  Vitals:   09/16/22 0817  TempSrc: Temporal  PainSc: 0-No pain         Complications: No notable events documented.

## 2022-09-17 ENCOUNTER — Encounter (HOSPITAL_COMMUNITY): Payer: Self-pay | Admitting: Gastroenterology

## 2022-09-17 DIAGNOSIS — Z95818 Presence of other cardiac implants and grafts: Secondary | ICD-10-CM | POA: Diagnosis not present

## 2022-09-17 DIAGNOSIS — I5020 Unspecified systolic (congestive) heart failure: Secondary | ICD-10-CM | POA: Diagnosis not present

## 2022-09-17 DIAGNOSIS — Z4509 Encounter for adjustment and management of other cardiac device: Secondary | ICD-10-CM | POA: Diagnosis not present

## 2022-09-17 LAB — SURGICAL PATHOLOGY

## 2022-09-24 ENCOUNTER — Other Ambulatory Visit: Payer: Self-pay

## 2022-09-24 MED ORDER — ASPIRIN 81 MG PO TBEC: 81.0000 mg | DELAYED_RELEASE_TABLET | Freq: Every day | ORAL | 3 refills | Status: DC

## 2022-09-29 ENCOUNTER — Encounter: Payer: BC Managed Care – PPO | Admitting: Physical Therapy

## 2022-09-30 ENCOUNTER — Encounter: Payer: BC Managed Care – PPO | Admitting: Physical Therapy

## 2022-10-09 ENCOUNTER — Ambulatory Visit: Payer: BC Managed Care – PPO

## 2022-10-09 DIAGNOSIS — I5020 Unspecified systolic (congestive) heart failure: Secondary | ICD-10-CM | POA: Diagnosis not present

## 2022-10-10 ENCOUNTER — Other Ambulatory Visit: Payer: BC Managed Care – PPO

## 2022-10-13 ENCOUNTER — Other Ambulatory Visit: Payer: Self-pay

## 2022-10-13 DIAGNOSIS — I5020 Unspecified systolic (congestive) heart failure: Secondary | ICD-10-CM

## 2022-10-13 MED ORDER — ASPIRIN 81 MG PO TBEC: 81.0000 mg | DELAYED_RELEASE_TABLET | Freq: Every day | ORAL | 3 refills | Status: DC

## 2022-10-13 MED ORDER — DAPAGLIFLOZIN PROPANEDIOL 10 MG PO TABS: ORAL_TABLET | ORAL | 2 refills | Status: DC

## 2022-10-14 ENCOUNTER — Ambulatory Visit (INDEPENDENT_AMBULATORY_CARE_PROVIDER_SITE_OTHER): Payer: BC Managed Care – PPO | Admitting: Physical Therapy

## 2022-10-14 ENCOUNTER — Encounter: Payer: Self-pay | Admitting: Physical Therapy

## 2022-10-14 DIAGNOSIS — M25652 Stiffness of left hip, not elsewhere classified: Secondary | ICD-10-CM

## 2022-10-14 DIAGNOSIS — M79605 Pain in left leg: Secondary | ICD-10-CM

## 2022-10-14 DIAGNOSIS — G546 Phantom limb syndrome with pain: Secondary | ICD-10-CM

## 2022-10-14 DIAGNOSIS — R293 Abnormal posture: Secondary | ICD-10-CM

## 2022-10-14 DIAGNOSIS — R2689 Other abnormalities of gait and mobility: Secondary | ICD-10-CM | POA: Diagnosis not present

## 2022-10-14 DIAGNOSIS — M6281 Muscle weakness (generalized): Secondary | ICD-10-CM

## 2022-10-14 DIAGNOSIS — R2681 Unsteadiness on feet: Secondary | ICD-10-CM | POA: Diagnosis not present

## 2022-10-14 NOTE — Therapy (Signed)
[facility] [address] [place], [place], [address] Phone: [phone]   Fax:  [phone]  Physical Therapy Treatment & Discharge Summary  Patient Details  Name: Mike [name] MRN: [id number] Date of Birth: 1961/11/07 Referring Provider (PT): Meridee Score, MD   Encounter Date: 10/14/2022  PHYSICAL THERAPY DISCHARGE SUMMARY  Visits from Start of Care: 47  05/22/2021 - 10/14/2022  Current functional level related to goals / functional outcomes: See below   Remaining deficits: See below   Education / Equipment: Prosthetic care, progressive functional activities & HEP.    Patient agrees to discharge. Patient goals were partially met. Patient is being discharged due to meeting the stated rehab goals.    PT End of Session - 10/14/22 1147     Visit Number 4    Number of Visits 73    Date for PT Re-Evaluation --    Authorization Type BCBS    Authorization Time Period Office manager Effective  09/20/3006 - 12/28/9998  20% COINSURANCE 12/29/2021 - Open-ended Deductible ($1,000.00)  $0.00 Paid $1,000.00 to go Out-of-Pocket Limit ($5,050.00)  $0.00 Paid $5,050.00 to go    PT Start Time 1145    PT Stop Time 1228    PT Time Calculation (min) 43 min    Equipment Utilized During Treatment Gait belt    Activity Tolerance Patient tolerated treatment well;Patient limited by pain    Behavior During Therapy WFL for tasks assessed/performed             Past Medical History:  Diagnosis Date   Anxiety    CAD (coronary artery disease)    CHF (congestive heart failure) ([place])    Cryptogenic stroke ([place]) 05/10/2021   Encounter for loop recorder check 07/26/2021   Heart attack ([place]) 08/01/2020   Heart failure with reduced ejection fraction ([place])    Hyperlipidemia    Hypertension    Implantable loop recorder present    Loop [employer] [id number] 05/10/2021 05/10/2021   PAD (peripheral artery disease) (HCC)    right CIA & right  distal iliac artery stents 10/16/20; left AKA 11/06/20   PFO (patent foramen ovale) 12/14/2020   Pneumonia    Pre-diabetes    Pulmonary AV (arteriovenous) fistula ([place]) 12/14/2020   Stroke ([place]) 12/11/2020   no residuial effects    Past Surgical History:  Procedure Laterality Date   ABDOMINAL AORTOGRAM W/LOWER EXTREMITY N/A 10/16/2020   Procedure: ABDOMINAL AORTOGRAM W/LOWER EXTREMITY;  Surgeon: Nigel Mormon, MD;  Location: [place] [facility];  Service: Cardiovascular;  Laterality: N/A;   AMPUTATION Left 11/06/2020   Procedure: AMPUTATION ABOVE KNEE;  Surgeon: Rosetta Posner, MD;  Location: [place];  Service: Vascular;  Laterality: Left;   BIOPSY  09/16/2022   Procedure: BIOPSY;  Surgeon: Otis Brace, MD;  Location: [facility];  Service: Gastroenterology;;   BUBBLE STUDY  12/14/2020   Procedure: BUBBLE STUDY;  Surgeon: Nigel Mormon, MD;  Location: [place];  Service: Cardiovascular;;   CARDIAC CATHETERIZATION     COLONOSCOPY WITH PROPOFOL N/A 09/16/2022   Procedure: COLONOSCOPY WITH PROPOFOL;  Surgeon: Otis Brace, MD;  Location: [facility];  Service: Gastroenterology;  Laterality: N/A;   CORONARY STENT INTERVENTION N/A 08/03/2020   Procedure: CORONARY STENT INTERVENTION;  Surgeon: Nigel Mormon, MD;  Location: [place] [facility];  Service: Cardiovascular;  Laterality: N/A;   CORONARY/GRAFT ACUTE MI REVASCULARIZATION N/A 08/01/2020   Procedure: Coronary/Graft Acute MI Revascularization;  Surgeon: Nigel Mormon, MD;  Location: [facility];  Service: Cardiovascular;  Laterality: N/A;   INTRAVASCULAR LITHOTRIPSY Right 10/16/2020   Procedure: INTRAVASCULAR LITHOTRIPSY;  Surgeon: Nigel Mormon, MD;  Location: [place] [facility];  Service: Cardiovascular;  Laterality: Right;  Common and External Iliac   IR ANGIOGRAM PULMONARY LEFT SELECTIVE  03/20/2021   IR ANGIOGRAM SELECTIVE EACH ADDITIONAL VESSEL  03/20/2021   IR EMBO ARTERIAL NOT HEMORR  HEMANG INC GUIDE ROADMAPPING  03/20/2021   IR RADIOLOGIST EVAL & MGMT  01/30/2021   IR RADIOLOGIST EVAL & MGMT  04/18/2021   IR RADIOLOGIST EVAL & MGMT  07/25/2021   IR US GUIDE VASC ACCESS RIGHT  03/20/2021   LEFT HEART CATH AND CORONARY ANGIOGRAPHY N/A 08/01/2020   Procedure: LEFT HEART CATH AND CORONARY ANGIOGRAPHY;  Surgeon: Nigel Mormon, MD;  Location: [place] [facility];  Service: Cardiovascular;  Laterality: N/A;   LEFT HEART CATH AND CORONARY ANGIOGRAPHY N/A 08/03/2020   Procedure: LEFT HEART CATH AND CORONARY ANGIOGRAPHY;  Surgeon: Nigel Mormon, MD;  Location: [place] [facility];  Service: Cardiovascular;  Laterality: N/A;   PERIPHERAL VASCULAR INTERVENTION Right 10/16/2020   Procedure: PERIPHERAL VASCULAR INTERVENTION;  Surgeon: Nigel Mormon, MD;  Location: [place] [facility];  Service: Cardiovascular;  Laterality: Right;  Common and Iliac Stent   POLYPECTOMY  09/16/2022   Procedure: POLYPECTOMY;  Surgeon: Otis Brace, MD;  Location: [facility];  Service: Gastroenterology;;   RADIOLOGY WITH ANESTHESIA N/A 03/20/2021   Procedure: PULMONARY EMBOLIZATION;  Surgeon: Aletta Edouard, MD;  Location: [facility];  Service: Radiology;  Laterality: N/A;   TEE WITHOUT CARDIOVERSION N/A 12/14/2020   Procedure: TRANSESOPHAGEAL ECHOCARDIOGRAM (TEE);  Surgeon: Nigel Mormon, MD;  Location[facility];  Service: Cardiovascular;  Laterality: N/A;    There were no vitals filed for this visit.   Subjective Assessment - 10/14/22 1145     Subjective He had colonoscopy.  His prosthesis had a screw come out so prosthetist changed his alignment which is letting knee flex easier now. no falls or near falls.  Walking in apt without device 33-50% of time, ~30% cane & remainder with single crutch.  In community using single crutch and carries 2nd crutch but has not needed 2nd crutch most time.    Pertinent History PAD, gangrene s/p L AKA 11/06/20, HTN, CAD s/p STEMI with stenting  08/17/2020, and CHF. cryptogenic stroke Dec 2021    Patient Stated Goals to return to work, (he was released from current job over last weekend but wants to find job).  to walk in community,    Currently in Pain? No/denies   residual limb 0/10 today, since last PT up to 3/10 with tylenol for relieve   Pain Onset More than a month ago    Pain Onset More than a month ago                [facility] PT Assessment - 10/14/22 1145       Berg Balance Test   Sit to Stand Able to stand without using hands and stabilize independently    Standing Unsupported Able to stand safely 2 minutes    Sitting with Back Unsupported but Feet Supported on Floor or Stool Able to sit safely and securely 2 minutes    Stand to Sit Sits safely with minimal use of hands    Transfers Able to transfer safely, minor use of hands    Standing Unsupported with Eyes Closed Able to stand 10 seconds safely  Standing Unsupported with Feet Together Able to place feet together independently and stand 1 minute safely    From Standing, Reach Forward with Outstretched Arm Can reach confidently >25 cm (10")    From Standing Position, Pick up Object from [place] to pick up shoe safely and easily    From Standing Position, Turn to Look Behind Over each Shoulder Looks behind from both sides and weight shifts well    Turn 360 Degrees Able to turn 360 degrees safely but slowly    Standing Unsupported, Alternately Place Feet on Step/Stool Able to complete 4 steps without aid or supervision    Standing Unsupported, One Foot in Front Able to take small step independently and hold 30 seconds    Standing on One Leg Tries to lift leg/unable to hold 3 seconds but remains standing independently    Total Score 47      Dynamic Gait Index   Level Surface Mild Impairment    Change in Gait Speed Mild Impairment    Gait with Horizontal Head Turns Normal    Gait with Vertical Head Turns Normal    Gait and Pivot Turn Mild Impairment    Step Over  Obstacle Mild Impairment    Step Around Obstacles Mild Impairment    Steps Moderate Impairment    Total Score 17    DGI comment: with cane standard alone tip      Timed Up and Go Test   Normal TUG (seconds) 15.87   10/14/22 16.56sec with cane & 15.87sec no AD   Cognitive TUG (seconds) 18.09   naming foods A-Z: 19.12sec with cane & 18.09sec without AD            Prosthetics Assessment - 10/14/22 1145       Prosthetics   Prosthetic Care Independent with Skin check;Residual limb care;Care of non-amputated limb;Prosthetic cleaning;Ply sock cleaning;Correct ply sock adjustment;Proper wear schedule/adjustment;Proper weight-bearing schedule/adjustment    Donning prosthesis  Independent    Doffing prosthesis  Independent    Current prosthetic wear tolerance (days/week)  daily    Current prosthetic wear tolerance (#hours/day)  most of awake hours    Residual limb condition  reports no issues                          [facility] Adult PT Treatment/Exercise - 10/14/22 1145       Ambulation/Gait   Ambulation/Gait Yes    Ambulation/Gait Assistance 6: Modified independent (Device/Increase time)    Ambulation/Gait Assistance Details 400' with cane & 100' no AD    Assistive device Prosthesis;None;Straight cane    Ambulation Surface Level;Indoor;Outdoor;Paved    Gait velocity with cane - 3.00 ft/sec comfortable & 3.50 ft/sec fast pace    Ramp 6: Modified independent (Device)   cane stand alone tip & prosthesis   Ramp Details (indicate cue type and reason) slow cautious    Curb 6: Modified independent (Device/increase time)   cane stand alone tip & TFA prosthesis   Curb Details (indicate cue type and reason) slow cautious                       PT Short Term Goals - 09/02/22 1238       PT SHORT TERM GOAL #1   Title Patient verbalizes understanding of updated HEP / prosthetic activities.    Baseline MET 09/02/2022    Time 3    Period --   visits  Status Achieved     Target Date 09/16/22      PT SHORT TERM GOAL #2   Title patient ambulates 500' with cane, negotiates ramp & curb with supervision.    Baseline Partially MET 09/02/2022 only able to amb up to 300' with cane before LEs fatigue.    Time 3    Period --   visits   Status Partially Met    Target Date 09/16/22      PT SHORT TERM GOAL #3   Title Patient reports prosthesis wear >90% awake hours per day with limb pain </= 2/10    Baseline MET 09/02/2022    Time 3    Period --   visits   Status Achieved    Target Date 07/08/22      PT SHORT TERM GOAL #4   Title patient ambulates 81' without device carrying open bottle of water with no balance losses.    Baseline MET 09/02/2022    Time 3    Period --   visits   Status Achieved    Target Date 09/16/22               PT Long Term Goals - 10/14/22 1241       PT LONG TERM GOAL #1   Title Patient tolerates wear >90% of awake hours without skin issues and limb pain <2/10.    Time 8   visits   Status Achieved    Target Date 10/14/22      PT LONG TERM GOAL #2   Title Timed Up & Go standard with cane and prosthesis < 13.5sec and cognitive TUG <15.5sec.  TUG with prosthesis only <25sec.    Time 8   visits   Status Partially Met    Target Date 10/14/22      PT LONG TERM GOAL #3   Title dynamic gait index with prosthesis & cane >/= 16/24.    Time 8   visits   Status Achieved    Target Date 10/14/22      PT LONG TERM GOAL #4   Title Patient ambulates 500' with cane & prosthesis modified independent.    Baseline up to 400' with cane    Time 8   visits   Status Partially Met    Target Date 10/14/22      PT LONG TERM GOAL #5   Title Patient negotiates ramps, curbs & stairs single rail with cane & prosthesis modified independent.    Time 8   visits   Status Achieved    Target Date 10/14/22      PT LONG TERM GOAL #6   Title Patient ambulates 100' carrying <5# item with prosthesis only modified independent.    Time 8   visits    Status Partially Met    Target Date 10/14/22                   Plan - 10/14/22 1242     Clinical Impression Statement Patient met or partially met his LTGs.  He has improved his function with his Transfemoral Prosthesis using cane.  He is able to ambulate within his apartment without assistive device.  He is still limited in standing tolerance & distance of gait.  His function has improved significantly and he is no longer limited by pain for his functional activities.    Personal Factors and Comorbidities Comorbidity 3+;Fitness;Time since onset of injury/illness/exacerbation    Comorbidities PAD, gangrene s/p L AKA 11/06/20,  HTN, CAD s/p STEMI with stenting 08/17/2020, and CHF. cryptogenic stroke Dec 2021    Examination-Activity Limitations Lift;Locomotion Level;Squat;Stairs;Stand;Transfers;Carry    Examination-Participation Restrictions Community Activity;Occupation    Stability/Clinical Decision Making Evolving/Moderate complexity    Rehab Potential Good    PT Frequency Biweekly    PT Duration --   8 visits   PT Treatment/Interventions ADLs/Self Care Home Management;DME Instruction;Gait training;Stair training;Functional mobility training;Therapeutic activities;Therapeutic exercise;Balance training;Neuromuscular re-education;Patient/family education;Prosthetic Training;Manual techniques;Passive range of motion;Ultrasound;Vestibular;Other (comment)   dry needling & Physical Performance Testing   PT Next Visit Plan discharge PT    Consulted and Agree with Plan of Care Patient             Patient will benefit from skilled therapeutic intervention in order to improve the following deficits and impairments:  Decreased activity tolerance, Decreased balance, Decreased endurance, Decreased knowledge of use of DME, Decreased mobility, Decreased range of motion, Decreased strength, Increased edema, Postural dysfunction, Prosthetic Dependency, Pain, Abnormal gait  Visit Diagnosis: Other  abnormalities of gait and mobility  Muscle weakness (generalized)  Unsteadiness on feet  Abnormal posture  Pain in left leg  Phantom limb syndrome with pain (HCC)  Stiffness of left hip, not elsewhere classified     Problem List Patient Active Problem List   Diagnosis Date Noted   Encounter for loop recorder check 07/26/2021   Cryptogenic stroke ([place]) 05/10/2021   Loop [employer] [id number] 05/10/2021 05/10/2021   AVM (arteriovenous malformation) 03/20/2021   Pulmonary AV (arteriovenous) fistula ([place]) 02/20/2021   Pre-ulcerative calluses 12/20/2020   Cerebral thrombosis with cerebral infarction 12/11/2020   Cerebrovascular accident (CVA) due to embolism of cerebral artery ([place]) 12/11/2020   Pressure injury of skin 11/12/2020   Acute urinary retention 11/09/2020   Normal anion gap metabolic acidosis 68/37/2902   Hypotension 11/07/2020   Chest pain 10/31/2020   Acute on chronic HFrEF (heart failure with reduced ejection fraction) ([place])    Goals of care, counseling/discussion    Palliative care by specialist    DNR (do not resuscitate)    Critical limb ischemia with history of revascularization of same extremity ([place]) 10/30/2020   Hyperglycemia 10/30/2020   Tobacco abuse 10/30/2020   Leukocytosis 10/30/2020   Chronic combined systolic and diastolic CHF (congestive heart failure) ([place]) 10/30/2020   Ischemic cardiomyopathy 10/24/2020   AKI (acute kidney injury) ([place]) 10/17/2020   PAD (peripheral artery disease) ([place]) 10/16/2020   Critical lower limb ischemia ([place]) 10/05/2020   Rest pain of left upper extremity due to atherosclerosis ([place]) 10/05/2020   Severe claudication ([place]) 09/20/2020   Paresthesia of both lower extremities 09/20/2020   H/O cardiac arrest 09/20/2020   H/O acute myocardial infarction 08/14/2020   Snoring 08/14/2020   Sinus pause 08/14/2020   Mixed hyperlipidemia 08/14/2020   Tobacco dependence 08/14/2020   Coronary artery disease involving native  coronary artery of native heart without angina pectoris 08/10/2020   HFrEF (heart failure with reduced ejection fraction) ([place]) 08/10/2020   Nonrheumatic mitral valve regurgitation 08/10/2020   STEMI (ST elevation myocardial infarction) ([place]) 08/02/2020   Accelerated hypertension 08/01/2020    Jamey Reas, PT 10/14/2022, 12:45 PM  [facility] [address] [place], [place], [address] Phone: [phone]   Fax:  [phone]  Name: Mike [name] MRN: [id number] Date of Birth: Jan 12, 1961

## 2022-10-16 ENCOUNTER — Ambulatory Visit: Payer: BC Managed Care – PPO | Admitting: Cardiology

## 2022-10-16 ENCOUNTER — Telehealth: Payer: Self-pay | Admitting: Orthopedic Surgery

## 2022-10-16 NOTE — Telephone Encounter (Signed)
Katie from Dr. Charlie Pitter office called. They would like to do a peer to peer with Dr. Sharol Given. Call back number is [phone]

## 2022-10-18 DIAGNOSIS — Z4509 Encounter for adjustment and management of other cardiac device: Secondary | ICD-10-CM | POA: Diagnosis not present

## 2022-10-18 DIAGNOSIS — I5020 Unspecified systolic (congestive) heart failure: Secondary | ICD-10-CM | POA: Diagnosis not present

## 2022-10-18 DIAGNOSIS — Z95818 Presence of other cardiac implants and grafts: Secondary | ICD-10-CM | POA: Diagnosis not present

## 2022-10-20 NOTE — Telephone Encounter (Signed)
I called and you are scheduled for a peer to peer with Dr. Melburn Hake on Thursday 10/23/22 at 12:30 for disability for this pt? They will call your cell phone number

## 2022-11-13 ENCOUNTER — Ambulatory Visit: Payer: BC Managed Care – PPO

## 2022-11-13 VITALS — BP 127/86 | HR 72 | Ht 72.0 in | Wt 196.0 lb

## 2022-11-13 DIAGNOSIS — I2510 Atherosclerotic heart disease of native coronary artery without angina pectoris: Secondary | ICD-10-CM

## 2022-11-13 DIAGNOSIS — I340 Nonrheumatic mitral (valve) insufficiency: Secondary | ICD-10-CM

## 2022-11-13 DIAGNOSIS — I5020 Unspecified systolic (congestive) heart failure: Secondary | ICD-10-CM | POA: Diagnosis not present

## 2022-11-13 NOTE — Progress Notes (Signed)
Follow up visit  Subjective:   Mike [name], male    DOB: 08-30-1961, 61 y.o.   MRN: [id number]   Congestive Heart Failure Pertinent negatives include no chest pain or palpitations. His past medical history is significant for CAD.  Coronary Artery Disease Pertinent negatives include no chest pain, leg swelling or palpitations. Risk factors include hyperlipidemia and hypertension. His past medical history is significant for CHF.  Hyperlipidemia Pertinent negatives include no chest pain.  Hypertension Pertinent negatives include no chest pain or palpitations.   Chief Complaint  Patient presents with   Follow-up    ECHO   Congestive Heart Failure   Coronary Artery Disease   Hyperlipidemia   Hypertension   61 y.o. Caucasian male with CAD, culprit (RCA) and nonculprit (LCx) PCI after STEMI 07/2020, HFrEF, moderate MR, h/o cardiac arrest 2/2 Torsades post PCI during index hospitalization, PAD with critical limb ischemia s/p Rt iliac revascularization, and s/p Lt AKA amputation for critical limb ischemia,  severe bilateral renal artery stenoses w/h/o CIN (09/2020), multifocal stroke s/p tPA with resolution of neurodeficit (11/2020), large AV pulmonary fistula-s/p closure by Dr. Kathlene Cote (02/2021), small PFO.  He presents today for 41-monthfollow-up after echocardiogram.  He is presently doing well and continues to not have any significant dyspnea, leg edema, orthopnea symptoms. He has tolerated Entresto well, lab details below. He is currently seeking new employment and is optimistic about a possible job opportunity.    Current Outpatient Medications:    aspirin EC 81 MG tablet, Take 1 tablet (81 mg total) by mouth daily. Swallow whole., Disp: 30 tablet, Rfl: 3   dapagliflozin propanediol (FARXIGA) 10 MG TABS tablet, TAKE 1 TABLET(10 MG) BY MOUTH DAILY BEFORE BREAKFAST, Disp: 30 tablet, Rfl: 2   DULoxetine (CYMBALTA) 30 MG capsule, TAKE 1 CAPSULE BY MOUTH EVERY DAY, Disp: 90 capsule,  Rfl: 2   ezetimibe (ZETIA) 10 MG tablet, Take 1 tablet (10 mg total) by mouth daily., Disp: 90 tablet, Rfl: 3   furosemide (LASIX) 20 MG tablet, TAKE 1 TABLET BY MOUTH EVERY DAY, Disp: 90 tablet, Rfl: 1   gabapentin (NEURONTIN) 300 MG capsule, Take 1 capsule (300 mg total) by mouth 2 (two) times daily. (Patient taking differently: Take 300 mg by mouth in the morning and at bedtime.), Disp: 60 capsule, Rfl: 0   icosapent Ethyl (VASCEPA) 1 g capsule, Take 2 capsules (2 g total) by mouth 2 (two) times daily., Disp: 120 capsule, Rfl: 2   metoprolol succinate (TOPROL-XL) 25 MG 24 hr tablet, Take 1 tablet (25 mg total) by mouth daily. Take with or immediately following a meal., Disp: 90 tablet, Rfl: 3   Multiple Vitamins-Minerals (MULTI FOR HIM 50+) TABS, Take 1 tablet by mouth See admin instructions., Disp: , Rfl:    nitroGLYCERIN (NITROSTAT) 0.4 MG SL tablet, Place 1 tablet (0.4 mg total) under the tongue every 5 (five) minutes as needed for chest pain., Disp: 25 tablet, Rfl: 1   rivaroxaban (XARELTO) 2.5 MG TABS tablet, Take 1 tablet (2.5 mg total) by mouth 2 (two) times daily., Disp: 180 tablet, Rfl: 3   rosuvastatin (CRESTOR) 40 MG tablet, Take 1 tablet (40 mg total) by mouth every evening., Disp: 90 tablet, Rfl: 3   sacubitril-valsartan (ENTRESTO) 49-51 MG, Take 1 tablet by mouth 2 (two) times daily., Disp: 180 tablet, Rfl: 3   acetaminophen (TYLENOL) 500 MG tablet, Take 500-1,000 mg by mouth every 6 (six) hours as needed (pain)., Disp: , Rfl:   Cardiovascular & other  pertient studies:  EKG 07/03/2022: Sinus rhythm 65 bpm Old anterior and inferior infarct Poor R wave progression  Echocardiogram 10/09/2022:  Left ventricle cavity is moderately dilated. Normal left ventricular wall  thickness. Moderate basal inferolateral and mild global hypokinesis. LVEF  40-45%. Doppler evidence of grade I (impaired) diastolic dysfunction,  normal LAP.  Mild mitral valve leaflet thickening. Moderate (grade II)  mitral  regurgitation.  Normal right atrial pressure.  Compared to previous study on 06/25/2022, LVEF has improved from 35-40%.  Mild PH not appreciated.   Remote loop recorder transmission 06/16/2022: Predominant rhythm is normal sinus rhythm.  Atrial monitoring episodes reveal PVCs and occasional ventricular couplets.  There was no atrial fibrillation, no asystole episodes since last transmission a month ago.     CTA Chest 12/14/2020: 1. Large 3.1 x 1.9 cm pulmonary arteriovenous fistula in the medial left lower lobe. 2. Right middle lobe 3 mm solid pulmonary nodule. Follow-up noncontrast chest CT recommended in 12 months in this high risk patient.This recommendation follows the consensus statement: Guidelines for Management of Incidental Pulmonary Nodules Detected on CT Images: From the Fleischner Society 2017; Radiology 2017; 284:228-243. 3. Mild cardiomegaly. 4. Aortic Atherosclerosis (ICD10-I70.0) and Emphysema (ICD10-J43.9).   PV intervention 10/16/2020: Successful intravascular lithotripsy, PTCA and stenting     7.0X39 mm balloon expandable Viabahn VBX stent Rt common iliac artery      7.0X60 mm self expanding Absolute Pro stent Rt distal iliac artery   No named vessels at ankle level in both lower extremities.  Will consult vascular surgery for Rt fem-to-left profunda bypass given left foot critical limb ischemia.   Abdominal Aortic Duplex  09/21/2020:  Moderate plaque noted in the proximal, mid and distal aorta. There is an  ulcerated plaque noted in the distal abdominal aorta. No AAA. Normal iliac  artery velocity.   Lower Extremity Arterial Duplex 09/21/2020:  The right SFA is occluded in the proximal segment with reconstitution at  the level of the popliteal artery with diffuse monophasic waveform below  the knee. Right profunda femoral artery has >50% stenosis. There is  moderate mixed plaque noted throughout the right lower extremity.  Monophasic waveform  throughout the left lower extremity, indicates  significant proximal disease (iliac artery).   Left SFA is occluded in the proximal segment and reconstitutes just above popliteal artery and diffuse dampened monophasic waveform throughout the lower extremity below the  knee. There is moderate mixed plaque throughout the left lower extremity.   This exam reveals severely decreased perfusion of the right lower  extremity, noted at the dorsalis pedis artery level (ABI 0.42) and  critically decreased perfusion of the left lower extremity, noted at the  dorsalis pedis and post tibial artery level (ABI 0.03).   Coronary intervention 08/03/2020: LM: Distal 40% stenosis LAD: Mid 30% disease LCx: Subtotally occluded OM2, prox LCx 70% stenosis        Successful percutaneous coronary intervention OM2-Prox LCx        PTCA and overlapping stents placement         2.5 X 38 mm and 2.5 X 18 mm Resolute Onyx drug-eluting stents        100%--->0% stenosis. TIMI flow 0-->III        Small caliber distal vessel with moderate diffuse disease RCA: Prox 30% stenosis.         Patent mid RCA sttent 2.5 X 26 mm Resolute Onyx drug-eluting stent   LVEDP 42 mmHg   Coronary intervention 08/01/2020: LM: Distal 30% stenosis  LAD: Mid 30% disease LCx: Subtotally occluded OM2, bridging left-to-left and right-to-left collaterals RCA: Prox 30% stenosis.        Mid 100% occlusion        Successful percutaneous coronary intervention mid RCA        PTCA and stent placement 2.5 X 26 mm Resolute Onyx drug-eluting stent 100%--->0% stenosis. TIMI flow 0-->III     Recent labs: 07/17/2022: Glucose 89, BUN/Cr 23/1.28. EGFR 63. Na/K 141/4.5.  Chol 113, TG 278, HDL 33, LDL 38  12/06/2021: Chol 127, TG 215, HDL 37, LDL 55   Review of Systems  Cardiovascular:  Negative for chest pain, dyspnea on exertion, leg swelling, palpitations and syncope.  Musculoskeletal:        Left leg phantom pain    Vitals:   11/13/22 1117  11/13/22 1124  BP: (!) 144/87 127/86  Pulse: 76 72  SpO2: 96% 97%     Body mass index is 26.58 kg/m. Filed Weights   11/13/22 1117  Weight: 196 lb (88.9 kg)    Objective:   Physical Exam Neck:     Vascular: No JVD.  Cardiovascular:     Rate and Rhythm: Normal rate and regular rhythm.     Pulses:          Dorsalis pedis pulses are 0 on the right side.       Posterior tibial pulses are 0 on the right side.     Heart sounds: No murmur heard. Pulmonary:     Effort: Pulmonary effort is normal.     Breath sounds: Normal breath sounds. No wheezing or rales.  Musculoskeletal:     Right lower leg: No edema.     Comments: S/p left AKA  Neurological:     Mental Status: He is alert.    Assessment & Recommendations:   61 y.o. Caucasian male with CAD, culprit (RCA) and nonculprit (LCx) PCI after STEMI 07/2020, HFrEF, moderate MR, h/o cardiac arrest 2/2 Torsades post PCI during index hospitalization, PAD with critical limb ischemia s/p Rt iliac revascularization, and s/p Lt AKA amputation for critical limb ischemia,  severe bilateral renal artery stenoses w/h/o CIN (09/2020), stroke s/p tPA with resolution of neurodeficit (11/2020), multifocal stroke on MRI suspicious for cardioembolic source, small PFO/secundum ASD and large pulmonary AV fistula now s/p closure by Dr. Kathlene Cote (02/2021), ILR placement (04/2021), asymptomatic sinus pauses  HFrEF, secondary mitral regurgitation: Reviewed results of recent echocardiogram.  LV moderately dilated, previously was severely dilated.  EF has improved to 40 to 45% from 35 to 40%.  Mild PH that was present on previous echocardiogram not appreciated. No clinical evidence of heart failure on physical exam. Will need cautious uptitration of GDMT for HFrEF given renal dysfunction and known bilateral renal artery stenosis. Currently on Entresto 24-26 mg bid, metoprolol succinate 25 mg daily, Farxiga 10 mg daily. Will add spironolactone 12.5 mg  daily.  CAD: S/p STEMI 07/2020. Currently no angina symptoms Continue aspirin 81 mg daily and Xarelto 2.5 mg bid. Continue Crestor 40 mg, Zetia 10 mg daily.  TG 220, LDL 31.  Added Vascepa 2 g bid. We will repeat lipid panel prior to next visit.  Follow-up in 3 months or sooner if needed.    Ernst Spell, [place] Pager: [phone] Office: [phone]

## 2022-11-14 ENCOUNTER — Other Ambulatory Visit: Payer: Self-pay | Admitting: Orthopedic Surgery

## 2022-11-18 DIAGNOSIS — I5020 Unspecified systolic (congestive) heart failure: Secondary | ICD-10-CM | POA: Diagnosis not present

## 2022-11-18 DIAGNOSIS — Z95818 Presence of other cardiac implants and grafts: Secondary | ICD-10-CM | POA: Diagnosis not present

## 2022-11-18 DIAGNOSIS — Z4509 Encounter for adjustment and management of other cardiac device: Secondary | ICD-10-CM | POA: Diagnosis not present

## 2022-11-24 ENCOUNTER — Other Ambulatory Visit: Payer: Self-pay | Admitting: Cardiology

## 2022-11-24 DIAGNOSIS — I2510 Atherosclerotic heart disease of native coronary artery without angina pectoris: Secondary | ICD-10-CM

## 2022-11-25 ENCOUNTER — Other Ambulatory Visit: Payer: Self-pay

## 2022-11-25 MED ORDER — ASPIRIN 81 MG PO TBEC: 81.0000 mg | DELAYED_RELEASE_TABLET | Freq: Every day | ORAL | 3 refills | Status: DC

## 2022-12-18 ENCOUNTER — Other Ambulatory Visit: Payer: Self-pay

## 2022-12-18 DIAGNOSIS — I2510 Atherosclerotic heart disease of native coronary artery without angina pectoris: Secondary | ICD-10-CM

## 2022-12-18 DIAGNOSIS — I739 Peripheral vascular disease, unspecified: Secondary | ICD-10-CM

## 2022-12-18 MED ORDER — RIVAROXABAN 2.5 MG PO TABS: 2.5000 mg | ORAL_TABLET | Freq: Two times a day (BID) | ORAL | 3 refills | Status: DC

## 2022-12-19 DIAGNOSIS — Z95818 Presence of other cardiac implants and grafts: Secondary | ICD-10-CM | POA: Diagnosis not present

## 2022-12-19 DIAGNOSIS — I5020 Unspecified systolic (congestive) heart failure: Secondary | ICD-10-CM | POA: Diagnosis not present

## 2022-12-19 DIAGNOSIS — Z4509 Encounter for adjustment and management of other cardiac device: Secondary | ICD-10-CM | POA: Diagnosis not present

## 2023-01-09 ENCOUNTER — Other Ambulatory Visit: Payer: Self-pay | Admitting: Cardiology

## 2023-01-09 DIAGNOSIS — I5020 Unspecified systolic (congestive) heart failure: Secondary | ICD-10-CM

## 2023-01-15 ENCOUNTER — Other Ambulatory Visit: Payer: Self-pay

## 2023-01-15 MED ORDER — ASPIRIN 81 MG PO TBEC: 81.0000 mg | DELAYED_RELEASE_TABLET | Freq: Every day | ORAL | 3 refills | Status: DC

## 2023-01-29 ENCOUNTER — Other Ambulatory Visit: Payer: Self-pay | Admitting: Family Medicine

## 2023-01-29 DIAGNOSIS — Z122 Encounter for screening for malignant neoplasm of respiratory organs: Secondary | ICD-10-CM

## 2023-02-13 ENCOUNTER — Encounter: Payer: Self-pay | Admitting: Cardiology

## 2023-02-13 ENCOUNTER — Ambulatory Visit: Payer: Medicare Other | Admitting: Cardiology

## 2023-02-13 VITALS — BP 152/74 | HR 73 | Resp 16 | Ht 72.0 in | Wt 192.0 lb

## 2023-02-13 DIAGNOSIS — I5020 Unspecified systolic (congestive) heart failure: Secondary | ICD-10-CM

## 2023-02-13 DIAGNOSIS — I340 Nonrheumatic mitral (valve) insufficiency: Secondary | ICD-10-CM

## 2023-02-13 DIAGNOSIS — I739 Peripheral vascular disease, unspecified: Secondary | ICD-10-CM

## 2023-02-13 DIAGNOSIS — E782 Mixed hyperlipidemia: Secondary | ICD-10-CM

## 2023-02-13 DIAGNOSIS — I2510 Atherosclerotic heart disease of native coronary artery without angina pectoris: Secondary | ICD-10-CM

## 2023-02-13 NOTE — Progress Notes (Signed)
Follow up visit  Subjective:   Mike [name], male    DOB: 09-05-1961, 62 y.o.   MRN: [id number]    HPI   Chief Complaint  Patient presents with   HFrEF   Hypertension   Hyperlipidemia   Follow-up    31 month    62 y.o. Caucasian male with CAD, culprit (RCA) and nonculprit (LCx) PCI after STEMI 07/2020, HFrEF, moderate MR, h/o cardiac arrest 2/2 Torsades post PCI during index hospitalization, PAD with critical limb ischemia s/p Rt iliac revascularization, and s/p Lt AKA amputation for critical limb ischemia,  severe bilateral renal artery stenoses w/h/o CIN (09/2020), multifocal stroke s/p tPA with resolution of neurodeficit (11/2020), large AV pulmonary fistula-s/p closure by Dr. Kathlene Cote (02/2021), small PFO.  Patient is doing well, denies chest pain, shortness of breath, palpitations, leg edema, orthopnea, PND, TIA/syncope.  Blood pressure elevated today, but was perfectly normal at recent PCP visit. Reviewed recent test results with the patient, details below.      Current Outpatient Medications:    aspirin EC 81 MG tablet, Take 1 tablet (81 mg total) by mouth daily. Swallow whole., Disp: 90 tablet, Rfl: 3   dapagliflozin propanediol (FARXIGA) 10 MG TABS tablet, TAKE 1 TABLET(10 MG) BY MOUTH DAILY BEFORE BREAKFAST, Disp: 90 tablet, Rfl: 2   DULoxetine (CYMBALTA) 30 MG capsule, TAKE 1 CAPSULE BY MOUTH EVERY DAY, Disp: 90 capsule, Rfl: 2   ezetimibe (ZETIA) 10 MG tablet, Take 1 tablet (10 mg total) by mouth daily., Disp: 90 tablet, Rfl: 3   furosemide (LASIX) 20 MG tablet, TAKE 1 TABLET BY MOUTH EVERY DAY, Disp: 90 tablet, Rfl: 1   gabapentin (NEURONTIN) 300 MG capsule, Take 1 capsule (300 mg total) by mouth 2 (two) times daily. (Patient taking differently: Take 300 mg by mouth in the morning and at bedtime.), Disp: 60 capsule, Rfl: 0   metoprolol succinate (TOPROL-XL) 25 MG 24 hr tablet, Take 1 tablet (25 mg total) by mouth daily. Take with or immediately following a meal.,  Disp: 90 tablet, Rfl: 3   Multiple Vitamins-Minerals (MULTI FOR HIM 50+) TABS, Take 1 tablet by mouth See admin instructions., Disp: , Rfl:    nitroGLYCERIN (NITROSTAT) 0.4 MG SL tablet, Place 1 tablet (0.4 mg total) under the tongue every 5 (five) minutes as needed for chest pain., Disp: 25 tablet, Rfl: 1   rivaroxaban (XARELTO) 2.5 MG TABS tablet, Take 1 tablet (2.5 mg total) by mouth 2 (two) times daily., Disp: 180 tablet, Rfl: 3   rosuvastatin (CRESTOR) 40 MG tablet, Take 1 tablet (40 mg total) by mouth every evening., Disp: 90 tablet, Rfl: 3   sacubitril-valsartan (ENTRESTO) 49-51 MG, Take 1 tablet by mouth 2 (two) times daily., Disp: 180 tablet, Rfl: 3   acetaminophen (TYLENOL) 500 MG tablet, Take 500-1,000 mg by mouth every 6 (six) hours as needed (pain)., Disp: , Rfl:    VASCEPA 1 g capsule, TAKE 2 CAPSULES(2 GRAMS) BY MOUTH TWICE DAILY, Disp: 120 capsule, Rfl: 2  Cardiovascular & other pertient studies:  EKG 02/13/2023: Sinus rhythm 68 bpm Frequent ectopic ventricular beats  Poor R-wave progression  Nonspecific T-abnormality   Echocardiogram 10/09/2022: Left ventricle cavity is moderately dilated. Normal left ventricular wall thickness. Moderate basal inferolateral and mild global hypokinesis. LVEF 40-45%. Doppler evidence of grade I (impaired) diastolic dysfunction, normal LAP. Mild mitral valve leaflet thickening. Moderate (grade II) mitral regurgitation. Normal right atrial pressure. Compared to previous study on 06/25/2022, LVEF has improved from 35-40%. Mild PH not  appreciated.   Remote loop recorder transmission 06/16/2022: Predominant rhythm is normal sinus rhythm.  Atrial monitoring episodes reveal PVCs and occasional ventricular couplets.  There was no atrial fibrillation, no asystole episodes since last transmission a month ago.     CTA Chest 12/14/2020: 1. Large 3.1 x 1.9 cm pulmonary arteriovenous fistula in the medial left lower lobe. 2. Right middle lobe 3 mm  solid pulmonary nodule. Follow-up noncontrast chest CT recommended in 12 months in this high risk patient.This recommendation follows the consensus statement: Guidelines for Management of Incidental Pulmonary Nodules Detected on CT Images: From the Fleischner Society 2017; Radiology 2017; 284:228-243. 3. Mild cardiomegaly. 4. Aortic Atherosclerosis (ICD10-I70.0) and Emphysema (ICD10-J43.9).    PV intervention 10/16/2020: Successful intravascular lithotripsy, PTCA and stenting     7.0X39 mm balloon expandable Viabahn VBX stent Rt common iliac artery      7.0X60 mm self expanding Absolute Pro stent Rt distal iliac artery   No named vessels at ankle level in both lower extremities.  Will consult vascular surgery for Rt fem-to-left profunda bypass given left foot critical limb ischemia.   Abdominal Aortic Duplex  09/21/2020:  Moderate plaque noted in the proximal, mid and distal aorta. There is an  ulcerated plaque noted in the distal abdominal aorta. No AAA. Normal iliac  artery velocity.   Lower Extremity Arterial Duplex 09/21/2020:  The right SFA is occluded in the proximal segment with reconstitution at  the level of the popliteal artery with diffuse monophasic waveform below  the knee. Right profunda femoral artery has >50% stenosis. There is  moderate mixed plaque noted throughout the right lower extremity.  Monophasic waveform throughout the left lower extremity, indicates  significant proximal disease (iliac artery).   Left SFA is occluded in the proximal segment and reconstitutes just above popliteal artery and diffuse dampened monophasic waveform throughout the lower extremity below the  knee. There is moderate mixed plaque throughout the left lower extremity.   This exam reveals severely decreased perfusion of the right lower  extremity, noted at the dorsalis pedis artery level (ABI 0.42) and  critically decreased perfusion of the left lower extremity, noted at the   dorsalis pedis and post tibial artery level (ABI 0.03).   Coronary intervention 08/03/2020: LM: Distal 40% stenosis LAD: Mid 30% disease LCx: Subtotally occluded OM2, prox LCx 70% stenosis        Successful percutaneous coronary intervention OM2-Prox LCx        PTCA and overlapping stents placement         2.5 X 38 mm and 2.5 X 18 mm Resolute Onyx drug-eluting stents        100%--->0% stenosis. TIMI flow 0-->III        Small caliber distal vessel with moderate diffuse disease RCA: Prox 30% stenosis.         Patent mid RCA sttent 2.5 X 26 mm Resolute Onyx drug-eluting stent   LVEDP 42 mmHg   Coronary intervention 08/01/2020: LM: Distal 30% stenosis LAD: Mid 30% disease LCx: Subtotally occluded OM2, bridging left-to-left and right-to-left collaterals RCA: Prox 30% stenosis.        Mid 100% occlusion        Successful percutaneous coronary intervention mid RCA        PTCA and stent placement 2.5 X 26 mm Resolute Onyx drug-eluting stent 100%--->0% stenosis. TIMI flow 0-->III     Recent labs: 08/21/2022: Glucose 81, BUN/Cr 22/1.15. EGFR 72. Na/K 141/4.4.  NTproBNP 1226 Chol 109, TG 220, HDL  34, LDL 43  07/17/2022: Glucose 89, BUN/Cr 23/1.28. EGFR 63. Na/K 141/4.5.  Chol 113, TG 278, HDL 33, LDL 38  12/06/2021: Chol 127, TG 215, HDL 37, LDL 55   Review of Systems  Cardiovascular:  Negative for chest pain, dyspnea on exertion, leg swelling, palpitations and syncope.  Musculoskeletal:        Left leg phantom pain          Vitals:   02/13/23 1048 02/13/23 1051  BP: (!) 176/82 (!) 152/74  Pulse: 86 73  Resp: 16   SpO2: 96%      Body mass index is 26.04 kg/m. Filed Weights   02/13/23 1048  Weight: 192 lb (87.1 kg)     Objective:   Physical Exam Neck:     Vascular: No JVD.  Cardiovascular:     Rate and Rhythm: Normal rate and regular rhythm.     Pulses:          Dorsalis pedis pulses are 0 on the right side.       Posterior tibial pulses are 0 on the right  side.     Heart sounds: No murmur heard. Musculoskeletal:     Right lower leg: No edema.     Comments: S/p left AKA Now has prosthesis           Assessment & Recommendations:   62 y.o. Caucasian male with CAD, culprit (RCA) and nonculprit (LCx) PCI after STEMI 07/2020, HFrEF, moderate MR, h/o cardiac arrest 2/2 Torsades post PCI during index hospitalization, PAD with critical limb ischemia s/p Rt iliac revascularization, and s/p Lt AKA amputation for critical limb ischemia,  severe bilateral renal artery stenoses w/h/o CIN (09/2020), stroke s/p tPA with resolution of neurodeficit (11/2020), multifocal stroke on MRI suspicious for cardioembolic source, small PFO/secundum ASD and large pulmonary AV fistula now s/p closure by Dr. Kathlene Cote (02/2021), ILR placement (04/2021), asymptomatic sinus pauses  HFrEF, secondary mitral regurgitation: LVEF 40-45% (09/2022) Clinically, appears fairly compensated. On relatively low-dose GDMT for HFrEF given renal dysfunction and known bilateral renal artery stenosis, as well as asymptomatic sinus pauses. Currently on Entresto 24-26 mg bid, metoprolol succinate 25 mg daily, Farxiga 10 mg daily, spironolactone 12.5 mg daily.  Continue the same.  If his blood pressure remains elevated, will consider increasing either Entresto or metoprolol cautiously.  I encouraged him to keep a log of his blood pressure readings and bring his blood pressure on appropriate monitors next visit.  Asymptomatic sinus pauses: Likely due to OSA. He is not interested in sleep testing. No recurrence.  Stroke: Multifocal stroke with small PFO/secundum ASD but large pulmonary AV fistula (11/2020), now s/p closure by Dr. Kathlene Cote (02/2021) Repeat TCD bubble study as pr Dr. Clydene Fake recommendations S/p ILR placement (04/2021). No Afib detected thus far  PAD: Successful revascularization of right common iliac and right external iliac artery (09/2020) S/p Lt AKA.  No critical limb  ischemia, although he has pre-ulcerative calloses on right sole.  Continue Aspirin 81 mg daily and Xarelto 2.5 mg bid.  CAD: S/p STEMI 07/2020. Currently no angina symptoms Continue aspirin 81 mg daily and Xarelto 2.5 mg bid. Continue Crestor 40 mg, Zetia 10 mg daily.  Chol 109, TG 220, HDL 34, LDL 43 (07/2022) Check fasting lipid panel.  If triglyceride remain elevated, will consider adding Vascepa.  It appears that it was started this before.  F/u in 6 weeks    Nigel Mormon, [place] Pager: [phone] Office: [phone]

## 2023-02-17 LAB — LIPID PANEL WITH LDL/HDL RATIO
Cholesterol, Total: 111 mg/dL (ref 100–199)
HDL: 38 mg/dL — ABNORMAL LOW (ref >39)
LDL Chol Calc (NIH): 46 mg/dL (ref 0–99)
LDL/HDL Ratio: 1.2 ratio (ref 0.0–3.6)
Triglycerides: 162 mg/dL — ABNORMAL HIGH (ref 0–149)
VLDL Cholesterol Cal: 27 mg/dL (ref 5–40)

## 2023-02-26 ENCOUNTER — Other Ambulatory Visit: Payer: Self-pay | Admitting: Cardiology

## 2023-02-26 DIAGNOSIS — I5020 Unspecified systolic (congestive) heart failure: Secondary | ICD-10-CM

## 2023-02-27 ENCOUNTER — Telehealth: Payer: Self-pay

## 2023-02-27 NOTE — Telephone Encounter (Signed)
Better. Please see if he is available. I called him last week re: disability paperwork but could not reach.  Thanks MJP

## 2023-02-27 NOTE — Telephone Encounter (Signed)
Bp reading 126 69 153/88 140/80 hr averages 60

## 2023-03-02 ENCOUNTER — Ambulatory Visit
Admission: RE | Admit: 2023-03-02 | Discharge: 2023-03-02 | Disposition: A | Discharge status: Home / Self Care | Payer: Medicaid Other | Source: Ambulatory Visit | Attending: Family Medicine

## 2023-03-02 DIAGNOSIS — Z122 Encounter for screening for malignant neoplasm of respiratory organs: Secondary | ICD-10-CM

## 2023-03-02 NOTE — Telephone Encounter (Signed)
Called patient to inform him about the message above. Patient understood

## 2023-03-03 ENCOUNTER — Other Ambulatory Visit: Payer: Self-pay | Admitting: Cardiology

## 2023-03-03 DIAGNOSIS — I739 Peripheral vascular disease, unspecified: Secondary | ICD-10-CM

## 2023-03-03 DIAGNOSIS — I2510 Atherosclerotic heart disease of native coronary artery without angina pectoris: Secondary | ICD-10-CM

## 2023-03-16 ENCOUNTER — Other Ambulatory Visit: Payer: Self-pay | Admitting: Cardiology

## 2023-03-16 DIAGNOSIS — I5020 Unspecified systolic (congestive) heart failure: Secondary | ICD-10-CM

## 2023-03-26 ENCOUNTER — Ambulatory Visit: Payer: Medicare Other | Admitting: Cardiology

## 2023-03-26 ENCOUNTER — Encounter: Payer: Self-pay | Admitting: Cardiology

## 2023-03-26 VITALS — BP 122/67 | HR 66 | Ht 72.0 in | Wt 191.0 lb

## 2023-03-26 DIAGNOSIS — E782 Mixed hyperlipidemia: Secondary | ICD-10-CM

## 2023-03-26 DIAGNOSIS — I252 Old myocardial infarction: Secondary | ICD-10-CM

## 2023-03-26 DIAGNOSIS — I739 Peripheral vascular disease, unspecified: Secondary | ICD-10-CM

## 2023-03-26 DIAGNOSIS — I5020 Unspecified systolic (congestive) heart failure: Secondary | ICD-10-CM

## 2023-03-26 DIAGNOSIS — I2510 Atherosclerotic heart disease of native coronary artery without angina pectoris: Secondary | ICD-10-CM

## 2023-03-26 MED ORDER — EZETIMIBE 10 MG PO TABS: 10.0000 mg | ORAL_TABLET | Freq: Every day | ORAL | 3 refills | Status: DC

## 2023-03-26 MED ORDER — RIVAROXABAN 2.5 MG PO TABS: 2.5000 mg | ORAL_TABLET | Freq: Two times a day (BID) | ORAL | 3 refills | Status: DC

## 2023-03-26 MED ORDER — NITROGLYCERIN 0.4 MG SL SUBL: 0.4000 mg | SUBLINGUAL_TABLET | SUBLINGUAL | 1 refills | Status: DC | PRN

## 2023-03-26 MED ORDER — ASPIRIN 81 MG PO TBEC: 81.0000 mg | DELAYED_RELEASE_TABLET | Freq: Every day | ORAL | 3 refills | Status: DC

## 2023-03-26 MED ORDER — ROSUVASTATIN CALCIUM 40 MG PO TABS: 40.0000 mg | ORAL_TABLET | Freq: Every day | ORAL | 3 refills | Status: DC

## 2023-03-26 MED ORDER — ICOSAPENT ETHYL 1 G PO CAPS: ORAL_CAPSULE | ORAL | 2 refills | Status: DC

## 2023-03-26 MED ORDER — ENTRESTO 49-51 MG PO TABS: 1.0000 | ORAL_TABLET | Freq: Two times a day (BID) | ORAL | 3 refills | Status: DC

## 2023-03-26 MED ORDER — DAPAGLIFLOZIN PROPANEDIOL 10 MG PO TABS: ORAL_TABLET | ORAL | 3 refills | Status: DC

## 2023-03-26 MED ORDER — METOPROLOL SUCCINATE ER 25 MG PO TB24: ORAL_TABLET | ORAL | 3 refills | Status: DC

## 2023-03-26 NOTE — Progress Notes (Signed)
Follow up visit  Subjective:   Mike [name], male    DOB: 1961-03-16, 62 y.o.   MRN: [id number]    HPI   Chief Complaint  Patient presents with   HFrEF (heart failure with reduced ejection fraction) ([place])   Coronary artery disease involving native coronary artery of   Follow-up    62 y.o. Caucasian male with CAD, culprit (RCA) and nonculprit (LCx) PCI after STEMI 07/2020, HFrEF, moderate MR, h/o cardiac arrest 2/2 Torsades post PCI during index hospitalization, PAD with critical limb ischemia s/p Rt iliac revascularization, and s/p Lt AKA amputation for critical limb ischemia,  severe bilateral renal artery stenoses w/h/o CIN (09/2020), multifocal stroke s/p tPA with resolution of neurodeficit (11/2020), large AV pulmonary fistula-s/p closure by Dr. Kathlene Cote (02/2021), small PFO.  Patient is doing well, denies chest pain, shortness of breath, palpitations, leg edema, orthopnea, PND, TIA/syncope.  Blood pressure elevated today, but was perfectly normal at recent PCP visit. Reviewed recent test results with the patient, details below.      Current Outpatient Medications:    acetaminophen (TYLENOL) 500 MG tablet, Take 500-1,000 mg by mouth every 6 (six) hours as needed (pain)., Disp: , Rfl:    aspirin EC 81 MG tablet, Take 1 tablet (81 mg total) by mouth daily. Swallow whole., Disp: 90 tablet, Rfl: 3   dapagliflozin propanediol (FARXIGA) 10 MG TABS tablet, TAKE 1 TABLET(10 MG) BY MOUTH DAILY BEFORE BREAKFAST, Disp: 90 tablet, Rfl: 2   DULoxetine (CYMBALTA) 30 MG capsule, TAKE 1 CAPSULE BY MOUTH EVERY DAY, Disp: 90 capsule, Rfl: 2   ezetimibe (ZETIA) 10 MG tablet, TAKE 1 TABLET BY MOUTH EVERY DAY, Disp: 90 tablet, Rfl: 3   furosemide (LASIX) 20 MG tablet, TAKE 1 TABLET BY MOUTH EVERY DAY, Disp: 90 tablet, Rfl: 1   gabapentin (NEURONTIN) 300 MG capsule, Take 1 capsule (300 mg total) by mouth 2 (two) times daily. (Patient taking differently: Take 300 mg by mouth in the morning and at  bedtime.), Disp: 60 capsule, Rfl: 0   metoprolol succinate (TOPROL-XL) 25 MG 24 hr tablet, TAKE 1 TABLET BY MOUTH EVERY DAY AFTER MEAL, Disp: 90 tablet, Rfl: 3   Multiple Vitamins-Minerals (MULTI FOR HIM 50+) TABS, Take 1 tablet by mouth See admin instructions., Disp: , Rfl:    nitroGLYCERIN (NITROSTAT) 0.4 MG SL tablet, Place 1 tablet (0.4 mg total) under the tongue every 5 (five) minutes as needed for chest pain., Disp: 25 tablet, Rfl: 1   rivaroxaban (XARELTO) 2.5 MG TABS tablet, Take 1 tablet (2.5 mg total) by mouth 2 (two) times daily., Disp: 180 tablet, Rfl: 3   rosuvastatin (CRESTOR) 40 MG tablet, TAKE 1 TABLET BY MOUTH EVERY DAY IN THE EVENING, Disp: 90 tablet, Rfl: 3   sacubitril-valsartan (ENTRESTO) 49-51 MG, Take 1 tablet by mouth 2 (two) times daily., Disp: 180 tablet, Rfl: 3   VASCEPA 1 g capsule, TAKE 2 CAPSULES(2 GRAMS) BY MOUTH TWICE DAILY, Disp: 120 capsule, Rfl: 2  Cardiovascular & other pertient studies:  EKG 02/13/2023: Sinus rhythm 68 bpm Frequent ectopic ventricular beats  Poor R-wave progression  Nonspecific T-abnormality   Echocardiogram 10/09/2022: Left ventricle cavity is moderately dilated. Normal left ventricular wall thickness. Moderate basal inferolateral and mild global hypokinesis. LVEF 40-45%. Doppler evidence of grade I (impaired) diastolic dysfunction, normal LAP. Mild mitral valve leaflet thickening. Moderate (grade II) mitral regurgitation. Normal right atrial pressure. Compared to previous study on 06/25/2022, LVEF has improved from 35-40%. Mild PH not appreciated.  Remote loop recorder transmission 06/16/2022: Predominant rhythm is normal sinus rhythm.  Atrial monitoring episodes reveal PVCs and occasional ventricular couplets.  There was no atrial fibrillation, no asystole episodes since last transmission a month ago.     CTA Chest 12/14/2020: 1. Large 3.1 x 1.9 cm pulmonary arteriovenous fistula in the medial left lower lobe. 2. Right middle  lobe 3 mm solid pulmonary nodule. Follow-up noncontrast chest CT recommended in 12 months in this high risk patient.This recommendation follows the consensus statement: Guidelines for Management of Incidental Pulmonary Nodules Detected on CT Images: From the Fleischner Society 2017; Radiology 2017; 284:228-243. 3. Mild cardiomegaly. 4. Aortic Atherosclerosis (ICD10-I70.0) and Emphysema (ICD10-J43.9).    PV intervention 10/16/2020: Successful intravascular lithotripsy, PTCA and stenting     7.0X39 mm balloon expandable Viabahn VBX stent Rt common iliac artery      7.0X60 mm self expanding Absolute Pro stent Rt distal iliac artery   No named vessels at ankle level in both lower extremities.  Will consult vascular surgery for Rt fem-to-left profunda bypass given left foot critical limb ischemia.   Abdominal Aortic Duplex  09/21/2020:  Moderate plaque noted in the proximal, mid and distal aorta. There is an  ulcerated plaque noted in the distal abdominal aorta. No AAA. Normal iliac  artery velocity.   Lower Extremity Arterial Duplex 09/21/2020:  The right SFA is occluded in the proximal segment with reconstitution at  the level of the popliteal artery with diffuse monophasic waveform below  the knee. Right profunda femoral artery has >50% stenosis. There is  moderate mixed plaque noted throughout the right lower extremity.  Monophasic waveform throughout the left lower extremity, indicates  significant proximal disease (iliac artery).   Left SFA is occluded in the proximal segment and reconstitutes just above popliteal artery and diffuse dampened monophasic waveform throughout the lower extremity below the  knee. There is moderate mixed plaque throughout the left lower extremity.   This exam reveals severely decreased perfusion of the right lower  extremity, noted at the dorsalis pedis artery level (ABI 0.42) and  critically decreased perfusion of the left lower extremity, noted at  the  dorsalis pedis and post tibial artery level (ABI 0.03).   Coronary intervention 08/03/2020: LM: Distal 40% stenosis LAD: Mid 30% disease LCx: Subtotally occluded OM2, prox LCx 70% stenosis        Successful percutaneous coronary intervention OM2-Prox LCx        PTCA and overlapping stents placement         2.5 X 38 mm and 2.5 X 18 mm Resolute Onyx drug-eluting stents        100%--->0% stenosis. TIMI flow 0-->III        Small caliber distal vessel with moderate diffuse disease RCA: Prox 30% stenosis.         Patent mid RCA sttent 2.5 X 26 mm Resolute Onyx drug-eluting stent   LVEDP 42 mmHg   Coronary intervention 08/01/2020: LM: Distal 30% stenosis LAD: Mid 30% disease LCx: Subtotally occluded OM2, bridging left-to-left and right-to-left collaterals RCA: Prox 30% stenosis.        Mid 100% occlusion        Successful percutaneous coronary intervention mid RCA        PTCA and stent placement 2.5 X 26 mm Resolute Onyx drug-eluting stent 100%--->0% stenosis. TIMI flow 0-->III     Recent labs: 08/21/2022: Glucose 81, BUN/Cr 22/1.15. EGFR 72. Na/K 141/4.4.  NTproBNP 1226 Chol 109, TG 220, HDL 34, LDL 43  07/17/2022: Glucose 89, BUN/Cr 23/1.28. EGFR 63. Na/K 141/4.5.  Chol 113, TG 278, HDL 33, LDL 38  12/06/2021: Chol 127, TG 215, HDL 37, LDL 55   Review of Systems  Cardiovascular:  Negative for chest pain, dyspnea on exertion, leg swelling, palpitations and syncope.  Musculoskeletal:        Left leg phantom pain          Vitals:   03/26/23 1153 03/26/23 1154  BP: 124/67 122/67  Pulse: 66 66  SpO2: 94%      Body mass index is 25.9 kg/m. Filed Weights   03/26/23 1132  Weight: 191 lb (86.6 kg)     Objective:   Physical Exam Neck:     Vascular: No JVD.  Cardiovascular:     Rate and Rhythm: Normal rate and regular rhythm.     Pulses:          Dorsalis pedis pulses are 0 on the right side.       Posterior tibial pulses are 0 on the right side.     Heart  sounds: No murmur heard. Musculoskeletal:     Right lower leg: No edema.     Comments: S/p left AKA Now has prosthesis           Assessment & Recommendations:   62 y.o. Caucasian male with CAD, culprit (RCA) and nonculprit (LCx) PCI after STEMI 07/2020, HFrEF, moderate MR, h/o cardiac arrest 2/2 Torsades post PCI during index hospitalization, PAD with critical limb ischemia s/p Rt iliac revascularization, and s/p Lt AKA amputation for critical limb ischemia,  severe bilateral renal artery stenoses w/h/o CIN (09/2020), stroke s/p tPA with resolution of neurodeficit (11/2020), multifocal stroke on MRI suspicious for cardioembolic source, small PFO/secundum ASD and large pulmonary AV fistula now s/p closure by Dr. Kathlene Cote (02/2021), ILR placement (04/2021), asymptomatic sinus pauses  HFrEF, secondary mitral regurgitation: LVEF 40-45% (09/2022) Clinically, appears fairly compensated. On relatively low-dose GDMT for HFrEF given renal dysfunction and known bilateral renal artery stenosis, as well as asymptomatic sinus pauses. Currently on Entresto 24-26 mg bid, metoprolol succinate 25 mg daily, Farxiga 10 mg daily, spironolactone 12.5 mg daily.  Continue the same. Blood pressure now better controlled.   Asymptomatic sinus pauses: Likely due to OSA. He is not interested in sleep testing. No recurrence.  Stroke: Multifocal stroke with small PFO/secundum ASD but large pulmonary AV fistula (11/2020), now s/p closure by Dr. Kathlene Cote (02/2021) Repeat TCD bubble study as pr Dr. Clydene Fake recommendations S/p ILR placement (04/2021). No Afib detected thus far  PAD: Successful revascularization of right common iliac and right external iliac artery (09/2020) S/p Lt AKA.  No critical limb ischemia, although he has pre-ulcerative calloses on right sole.  Continue Aspirin 81 mg daily and Xarelto 2.5 mg bid.  CAD: S/p STEMI 07/2020. Currently no angina symptoms Continue aspirin 81 mg daily and Xarelto  2.5 mg bid. Continue Crestor 40 mg, Zetia 10 mg daily, Vascepa 2 g bid, Chol 109, TG 220, HDL 34, LDL 43 (07/2022) Lipid panel in 06/2023.  F/u in 07/2023    Nigel Mormon, MD Pager: [phone] Office: [phone]

## 2023-03-26 NOTE — Addendum Note (Signed)
Addended by: Nigel Mormon on: 03/26/2023 03:18 PM   Modules accepted: Orders

## 2023-03-30 ENCOUNTER — Other Ambulatory Visit: Payer: Self-pay

## 2023-04-02 ENCOUNTER — Telehealth: Payer: Self-pay

## 2023-04-02 NOTE — Telephone Encounter (Signed)
faxed patient assistance for Farxiga 10 mg on 04/02/23. LM

## 2023-04-07 ENCOUNTER — Other Ambulatory Visit: Payer: Self-pay

## 2023-04-07 DIAGNOSIS — I739 Peripheral vascular disease, unspecified: Secondary | ICD-10-CM

## 2023-04-07 DIAGNOSIS — I2510 Atherosclerotic heart disease of native coronary artery without angina pectoris: Secondary | ICD-10-CM

## 2023-04-07 MED ORDER — RIVAROXABAN 2.5 MG PO TABS: 2.5000 mg | ORAL_TABLET | Freq: Two times a day (BID) | ORAL | 3 refills | Status: DC

## 2023-04-16 ENCOUNTER — Other Ambulatory Visit: Payer: Self-pay | Admitting: Cardiology

## 2023-04-16 DIAGNOSIS — I252 Old myocardial infarction: Secondary | ICD-10-CM

## 2023-04-16 DIAGNOSIS — I2510 Atherosclerotic heart disease of native coronary artery without angina pectoris: Secondary | ICD-10-CM

## 2023-04-23 ENCOUNTER — Encounter: Payer: Self-pay | Admitting: Cardiology

## 2023-05-07 ENCOUNTER — Encounter: Payer: Self-pay | Admitting: Cardiology

## 2023-05-07 DIAGNOSIS — Z006 Encounter for examination for normal comparison and control in clinical research program: Secondary | ICD-10-CM

## 2023-05-07 NOTE — Progress Notes (Signed)
Patient enrolled in HERMES-HF Trial (Ziltivekimab - a monoclonal antibody for IL-6 inhibition Vs Placebo in patients with HFpEF on morbidity and mortality in patients with Vasc Dz and CRP > 2.0).   Yates Decamp, MD, [facility] 05/07/2023, 12:56 PM Office: [phone] Fax: [phone] Pager: [phone]

## 2023-05-08 ENCOUNTER — Other Ambulatory Visit: Payer: Medicare Other

## 2023-07-07 ENCOUNTER — Other Ambulatory Visit: Payer: Self-pay

## 2023-07-07 DIAGNOSIS — I739 Peripheral vascular disease, unspecified: Secondary | ICD-10-CM

## 2023-07-07 DIAGNOSIS — I2510 Atherosclerotic heart disease of native coronary artery without angina pectoris: Secondary | ICD-10-CM

## 2023-07-07 MED ORDER — RIVAROXABAN 2.5 MG PO TABS: 2.5000 mg | ORAL_TABLET | Freq: Two times a day (BID) | ORAL | 3 refills | Status: DC

## 2023-08-05 ENCOUNTER — Other Ambulatory Visit: Payer: Self-pay

## 2023-08-05 MED ORDER — ASPIRIN 81 MG PO TBEC: 81.0000 mg | DELAYED_RELEASE_TABLET | Freq: Every day | ORAL | 3 refills | Status: DC

## 2023-08-13 ENCOUNTER — Other Ambulatory Visit: Payer: Self-pay | Admitting: Interventional Radiology

## 2023-08-13 DIAGNOSIS — Z8774 Personal history of (corrected) congenital malformations of heart and circulatory system: Secondary | ICD-10-CM

## 2023-08-13 DIAGNOSIS — Q2572 Congenital pulmonary arteriovenous malformation: Secondary | ICD-10-CM

## 2023-08-23 ENCOUNTER — Other Ambulatory Visit: Payer: Self-pay | Admitting: Cardiology

## 2023-08-23 DIAGNOSIS — I5020 Unspecified systolic (congestive) heart failure: Secondary | ICD-10-CM

## 2023-08-26 ENCOUNTER — Encounter: Payer: Self-pay | Admitting: Cardiology

## 2023-08-26 ENCOUNTER — Ambulatory Visit: Payer: Medicare Other | Admitting: Cardiology

## 2023-08-26 VITALS — BP 136/83 | HR 86 | Resp 16 | Ht 72.0 in | Wt 191.0 lb

## 2023-08-26 DIAGNOSIS — I739 Peripheral vascular disease, unspecified: Secondary | ICD-10-CM

## 2023-08-26 DIAGNOSIS — I252 Old myocardial infarction: Secondary | ICD-10-CM

## 2023-08-26 DIAGNOSIS — I5020 Unspecified systolic (congestive) heart failure: Secondary | ICD-10-CM

## 2023-08-26 DIAGNOSIS — I2510 Atherosclerotic heart disease of native coronary artery without angina pectoris: Secondary | ICD-10-CM

## 2023-08-26 MED ORDER — METOPROLOL SUCCINATE ER 25 MG PO TB24
ORAL_TABLET | ORAL | 3 refills | Status: DC
Start: 2023-08-26 — End: 2024-03-18

## 2023-08-26 MED ORDER — ROSUVASTATIN CALCIUM 40 MG PO TABS
40.0000 mg | ORAL_TABLET | Freq: Every day | ORAL | 3 refills | Status: DC
Start: 2023-08-26 — End: 2024-03-14

## 2023-08-26 MED ORDER — DAPAGLIFLOZIN PROPANEDIOL 10 MG PO TABS
ORAL_TABLET | ORAL | 3 refills | Status: DC
Start: 2023-08-26 — End: 2024-02-25

## 2023-08-26 MED ORDER — NITROGLYCERIN 0.4 MG SL SUBL
0.4000 mg | SUBLINGUAL_TABLET | SUBLINGUAL | 3 refills | Status: AC | PRN
Start: 2023-08-26 — End: ?

## 2023-08-26 MED ORDER — ENTRESTO 49-51 MG PO TABS
1.0000 | ORAL_TABLET | Freq: Two times a day (BID) | ORAL | 3 refills | Status: DC
Start: 2023-08-26 — End: 2024-04-04

## 2023-08-26 MED ORDER — ASPIRIN 81 MG PO TBEC: 81.0000 mg | DELAYED_RELEASE_TABLET | Freq: Every day | ORAL | 3 refills | Status: AC

## 2023-08-26 MED ORDER — EZETIMIBE 10 MG PO TABS: 10.0000 mg | ORAL_TABLET | Freq: Every day | ORAL | 3 refills | Status: DC

## 2023-08-26 MED ORDER — RIVAROXABAN 2.5 MG PO TABS
2.5000 mg | ORAL_TABLET | Freq: Two times a day (BID) | ORAL | 3 refills | Status: DC
Start: 2023-08-26 — End: 2024-04-29

## 2023-08-26 NOTE — Progress Notes (Signed)
Follow up visit  Subjective:   Mike [name], male    DOB: 1961-02-27, 62 y.o.   MRN: [id number]    HPI   Chief Complaint  Patient presents with   HFrEF (heart failure with reduced ejection fraction)   Mixed hyperlipidemia   Coronary artery disease involving native coronary artery of   Follow-up    62 y.o. Caucasian male with CAD, culprit (RCA) and nonculprit (LCx) PCI after STEMI 07/2020, HFrEF, moderate MR, h/o cardiac arrest 2/2 Torsades post PCI during index hospitalization, PAD with critical limb ischemia s/p Rt iliac revascularization, and s/p Lt AKA amputation for critical limb ischemia,  severe bilateral renal artery stenoses w/h/o CIN (09/2020), multifocal stroke s/p tPA with resolution of neurodeficit (11/2020), large AV pulmonary fistula-s/p closure by Dr. Fredia Sorrow (02/2021), small PFO.  Patient is doing well, denies chest pain, shortness of breath, palpitations, leg edema, orthopnea, PND, TIA/syncope. He is compliant with medical therapy.    Current Outpatient Medications:    acetaminophen (TYLENOL) 500 MG tablet, Take 500-1,000 mg by mouth every 6 (six) hours as needed (pain)., Disp: , Rfl:    aspirin EC 81 MG tablet, Take 1 tablet (81 mg total) by mouth daily. Swallow whole., Disp: 90 tablet, Rfl: 3   dapagliflozin propanediol (FARXIGA) 10 MG TABS tablet, TAKE 1 TABLET(10 MG) BY MOUTH DAILY BEFORE BREAKFAST, Disp: 90 tablet, Rfl: 3   DULoxetine (CYMBALTA) 30 MG capsule, TAKE 1 CAPSULE BY MOUTH EVERY DAY, Disp: 90 capsule, Rfl: 2   ezetimibe (ZETIA) 10 MG tablet, Take 1 tablet (10 mg total) by mouth daily., Disp: 90 tablet, Rfl: 3   furosemide (LASIX) 20 MG tablet, TAKE 1 TABLET BY MOUTH EVERY DAY, Disp: 30 tablet, Rfl: 5   gabapentin (NEURONTIN) 300 MG capsule, Take 1 capsule (300 mg total) by mouth 2 (two) times daily. (Patient taking differently: Take 300 mg by mouth in the morning and at bedtime.), Disp: 60 capsule, Rfl: 0   metoprolol succinate (TOPROL-XL) 25 MG  24 hr tablet, TAKE 1 TABLET BY MOUTH EVERY DAY AFTER MEAL, Disp: 90 tablet, Rfl: 3   Multiple Vitamins-Minerals (MULTI FOR HIM 50+) TABS, Take 1 tablet by mouth See admin instructions., Disp: , Rfl:    nitroGLYCERIN (NITROSTAT) 0.4 MG SL tablet, PLACE 1 TABLET UNDER TONGUE EVERY 5 MINS AS NEEDED FOR CHEST PAIN, Disp: 25 tablet, Rfl: 1   rivaroxaban (XARELTO) 2.5 MG TABS tablet, Take 1 tablet (2.5 mg total) by mouth 2 (two) times daily., Disp: 180 tablet, Rfl: 3   rosuvastatin (CRESTOR) 40 MG tablet, Take 1 tablet (40 mg total) by mouth daily., Disp: 90 tablet, Rfl: 3   sacubitril-valsartan (ENTRESTO) 49-51 MG, Take 1 tablet by mouth 2 (two) times daily., Disp: 180 tablet, Rfl: 3  Cardiovascular & other pertient studies:  EKG 02/13/2023: Sinus rhythm 68 bpm Frequent ectopic ventricular beats  Poor R-wave progression  Nonspecific T-abnormality  Remote loop recorder transmission 08/24/2023: Predominant rhythm is normal sinus rhythm.  Compared to prior transmission, brief atrial monitoring episodes = PACs and PVCs.  Bradycardia and junctional escape rhythm not present.  Echocardiogram 10/09/2022: Left ventricle cavity is moderately dilated. Normal left ventricular wall thickness. Moderate basal inferolateral and mild global hypokinesis. LVEF 40-45%. Doppler evidence of grade I (impaired) diastolic dysfunction, normal LAP. Mild mitral valve leaflet thickening. Moderate (grade II) mitral regurgitation. Normal right atrial pressure. Compared to previous study on 06/25/2022, LVEF has improved from 35-40%. Mild PH not appreciated.   Remote loop recorder transmission 06/16/2022: Predominant  rhythm is normal sinus rhythm.  Atrial monitoring episodes reveal PVCs and occasional ventricular couplets.  There was no atrial fibrillation, no asystole episodes since last transmission a month ago.     CTA Chest 12/14/2020: 1. Large 3.1 x 1.9 cm pulmonary arteriovenous fistula in the medial left lower  lobe. 2. Right middle lobe 3 mm solid pulmonary nodule. Follow-up noncontrast chest CT recommended in 12 months in this high risk patient.This recommendation follows the consensus statement: Guidelines for Management of Incidental Pulmonary Nodules Detected on CT Images: From the Fleischner Society 2017; Radiology 2017; 284:228-243. 3. Mild cardiomegaly. 4. Aortic Atherosclerosis (ICD10-I70.0) and Emphysema (ICD10-J43.9).    PV intervention 10/16/2020: Successful intravascular lithotripsy, PTCA and stenting     7.0X39 mm balloon expandable Viabahn VBX stent Rt common iliac artery      7.0X60 mm self expanding Absolute Pro stent Rt distal iliac artery   No named vessels at ankle level in both lower extremities.  Will consult vascular surgery for Rt fem-to-left profunda bypass given left foot critical limb ischemia.   Abdominal Aortic Duplex  09/21/2020:  Moderate plaque noted in the proximal, mid and distal aorta. There is an  ulcerated plaque noted in the distal abdominal aorta. No AAA. Normal iliac  artery velocity.   Lower Extremity Arterial Duplex 09/21/2020:  The right SFA is occluded in the proximal segment with reconstitution at  the level of the popliteal artery with diffuse monophasic waveform below  the knee. Right profunda femoral artery has >50% stenosis. There is  moderate mixed plaque noted throughout the right lower extremity.  Monophasic waveform throughout the left lower extremity, indicates  significant proximal disease (iliac artery).   Left SFA is occluded in the proximal segment and reconstitutes just above popliteal artery and diffuse dampened monophasic waveform throughout the lower extremity below the  knee. There is moderate mixed plaque throughout the left lower extremity.   This exam reveals severely decreased perfusion of the right lower  extremity, noted at the dorsalis pedis artery level (ABI 0.42) and  critically decreased perfusion of the left lower  extremity, noted at the  dorsalis pedis and post tibial artery level (ABI 0.03).   Coronary intervention 08/03/2020: LM: Distal 40% stenosis LAD: Mid 30% disease LCx: Subtotally occluded OM2, prox LCx 70% stenosis        Successful percutaneous coronary intervention OM2-Prox LCx        PTCA and overlapping stents placement         2.5 X 38 mm and 2.5 X 18 mm Resolute Onyx drug-eluting stents        100%--->0% stenosis. TIMI flow 0-->III        Small caliber distal vessel with moderate diffuse disease RCA: Prox 30% stenosis.         Patent mid RCA sttent 2.5 X 26 mm Resolute Onyx drug-eluting stent   LVEDP 42 mmHg   Coronary intervention 08/01/2020: LM: Distal 30% stenosis LAD: Mid 30% disease LCx: Subtotally occluded OM2, bridging left-to-left and right-to-left collaterals RCA: Prox 30% stenosis.        Mid 100% occlusion        Successful percutaneous coronary intervention mid RCA        PTCA and stent placement 2.5 X 26 mm Resolute Onyx drug-eluting stent 100%--->0% stenosis. TIMI flow 0-->III     Recent labs: 08/12/2023: Glucose 73, BUN/Cr 24/1.17. EGFR 70. Na/K 141/4.6.  Chol 119, TG 213, HDL 44, LDL 41  08/21/2022: Glucose 81, BUN/Cr 22/1.15. EGFR 72.  Na/K 141/4.4.  NTproBNP 1226 Chol 109, TG 220, HDL 34, LDL 43  07/17/2022: Glucose 89, BUN/Cr 23/1.28. EGFR 63. Na/K 141/4.5.  Chol 113, TG 278, HDL 33, LDL 38  12/06/2021: Chol 127, TG 215, HDL 37, LDL 55   Review of Systems  Cardiovascular:  Negative for chest pain, dyspnea on exertion, leg swelling, palpitations and syncope.  Musculoskeletal:        Left leg phantom pain          Vitals:   08/26/23 1259  BP: 136/83  Pulse: 86  Resp: 16  SpO2: 97%     Body mass index is 25.9 kg/m. Filed Weights   08/26/23 1259  Weight: 191 lb (86.6 kg)     Objective:   Physical Exam Neck:     Vascular: No JVD.  Cardiovascular:     Rate and Rhythm: Normal rate and regular rhythm.     Pulses:          Dorsalis  pedis pulses are 0 on the right side.       Posterior tibial pulses are 0 on the right side.     Heart sounds: No murmur heard. Musculoskeletal:     Right lower leg: No edema.     Comments: S/p left AKA Now has prosthesis          Assessment & Recommendations:   62 y.o. Caucasian male with CAD, culprit (RCA) and nonculprit (LCx) PCI after STEMI 07/2020, HFrEF, moderate MR, h/o cardiac arrest 2/2 Torsades post PCI during index hospitalization, PAD with critical limb ischemia s/p Rt iliac revascularization, and s/p Lt AKA amputation for critical limb ischemia,  severe bilateral renal artery stenoses w/h/o CIN (09/2020), stroke s/p tPA with resolution of neurodeficit (11/2020), multifocal stroke on MRI suspicious for cardioembolic source, small PFO/secundum ASD and large pulmonary AV fistula now s/p closure by Dr. Fredia Sorrow (02/2021), ILR placement (04/2021), asymptomatic sinus pauses  HFrEF, secondary mitral regurgitation: LVEF 40-45% (09/2022) Clinically, appears fairly compensated. On relatively low-dose GDMT for HFrEF given renal dysfunction and known bilateral renal artery stenosis, as well as asymptomatic sinus pauses. Currently on Entresto 24-26 mg bid, metoprolol succinate 25 mg daily, Farxiga 10 mg daily, spironolactone 12.5 mg daily.  Continue the same. Refilled today.  Asymptomatic sinus pauses: Likely due to OSA. He is not interested in sleep testing. No recurrence.  Stroke: Multifocal stroke with small PFO/secundum ASD but large pulmonary AV fistula (11/2020), now s/p closure by Dr. Fredia Sorrow (02/2021) Repeat TCD bubble study as pr Dr. Marlis Edelson recommendations S/p ILR placement (04/2021). No Afib detected thus far  PAD: Successful revascularization of right common iliac and right external iliac artery (09/2020) S/p Lt AKA.  No critical limb ischemia, although he has pre-ulcerative calloses on right sole.  Continue Aspirin 81 mg daily and Xarelto 2.5 mg bid.  CAD: S/p STEMI  07/2020. Currently no angina symptoms Continue aspirin 81 mg daily and Xarelto 2.5 mg bid. Continue Crestor 40 mg, Zetia 10 mg daily. Chol 119, TG 213, HDL 44, LDL 41 (07/2023).  F/u in 6 months     Elder Negus, MD Pager: [phone] Office: [phone]

## 2023-09-01 ENCOUNTER — Ambulatory Visit
Admission: RE | Admit: 2023-09-01 | Discharge: 2023-09-01 | Disposition: A | Discharge status: Home / Self Care | Payer: Medicare Other | Source: Ambulatory Visit | Attending: Interventional Radiology | Admitting: Interventional Radiology

## 2023-09-01 DIAGNOSIS — Q2572 Congenital pulmonary arteriovenous malformation: Secondary | ICD-10-CM

## 2023-09-01 DIAGNOSIS — Z8774 Personal history of (corrected) congenital malformations of heart and circulatory system: Secondary | ICD-10-CM

## 2023-09-01 MED ORDER — IOPAMIDOL (ISOVUE-370) INJECTION 76%
500.0000 mL | Freq: Once | INTRAVENOUS | Status: AC | PRN
  Administered 2023-09-01: 75 mL via INTRAVENOUS

## 2023-09-02 ENCOUNTER — Ambulatory Visit
Admission: RE | Admit: 2023-09-02 | Discharge: 2023-09-02 | Disposition: A | Discharge status: Home / Self Care | Payer: Medicare Other | Source: Ambulatory Visit | Attending: Interventional Radiology | Admitting: Interventional Radiology

## 2023-09-02 DIAGNOSIS — Z8774 Personal history of (corrected) congenital malformations of heart and circulatory system: Secondary | ICD-10-CM

## 2023-09-02 DIAGNOSIS — Q2572 Congenital pulmonary arteriovenous malformation: Secondary | ICD-10-CM

## 2023-09-02 HISTORY — PX: IR RADIOLOGIST EVAL & MGMT: IMG5224

## 2023-09-02 NOTE — Progress Notes (Signed)
Chief Complaint: Patient was consulted remotely today (TeleHealth) for follow up after occlusion of a pulmonary AV malformation.   History of Present Illness: Mike [name] is a 62 y.o. male  status post pulmonary arteriography with transcatheter occlusion of a left lower lobe pulmonary AV malformation/fistula on 03/20/2021.  He continues to feel well and denies any chest or neurologic symptoms. A follow up CTA of the chest was performed yesterday on 09/01/2023. He continues to follow up regularly with Dr. Waldon Merl for management of heart failure, hyperlipidemia, CAD and PVD.  Past Medical History:  Diagnosis Date   Anxiety    CAD (coronary artery disease)    CHF (congestive heart failure) (HCC)    Cryptogenic stroke (HCC) 05/10/2021   Encounter for loop recorder check 07/26/2021   Heart attack (HCC) 08/01/2020   Heart failure with reduced ejection fraction (HCC)    Hyperlipidemia    Hypertension    Implantable loop recorder present    Loop [employer] [id number] 05/10/2021 05/10/2021   PAD (peripheral artery disease) (HCC)    right CIA & right distal iliac artery stents 10/16/20; left AKA 11/06/20   PFO (patent foramen ovale) 12/14/2020   Pneumonia    Pre-diabetes    Pulmonary AV (arteriovenous) fistula (HCC) 12/14/2020   Stroke (HCC) 12/11/2020   no residuial effects    Past Surgical History:  Procedure Laterality Date   ABDOMINAL AORTOGRAM W/LOWER EXTREMITY N/A 10/16/2020   Procedure: ABDOMINAL AORTOGRAM W/LOWER EXTREMITY;  Surgeon: Elder Negus, MD;  Location: [facility];  Service: Cardiovascular;  Laterality: N/A;   AMPUTATION Left 11/06/2020   Procedure: AMPUTATION ABOVE KNEE;  Surgeon: Larina Earthly, MD;  Location: [facility] OR;  Service: Vascular;  Laterality: Left;   BIOPSY  09/16/2022   Procedure: BIOPSY;  Surgeon: Kathi Der, MD;  Location: [facility];  Service: Gastroenterology;;   BUBBLE STUDY  12/14/2020   Procedure: BUBBLE STUDY;   Surgeon: Elder Negus, MD;  Location: [facility];  Service: Cardiovascular;;   CARDIAC CATHETERIZATION     COLONOSCOPY     COLONOSCOPY  09/16/2022   COLONOSCOPY WITH PROPOFOL N/A 09/16/2022   Procedure: COLONOSCOPY WITH PROPOFOL;  Surgeon: Kathi Der, MD;  Location: [facility];  Service: Gastroenterology;  Laterality: N/A;   CORONARY STENT INTERVENTION N/A 08/03/2020   Procedure: CORONARY STENT INTERVENTION;  Surgeon: Elder Negus, MD;  Location: [facility];  Service: Cardiovascular;  Laterality: N/A;   CORONARY/GRAFT ACUTE MI REVASCULARIZATION N/A 08/01/2020   Procedure: Coronary/Graft Acute MI Revascularization;  Surgeon: Elder Negus, MD;  Location: [facility];  Service: Cardiovascular;  Laterality: N/A;   IR ANGIOGRAM PULMONARY LEFT SELECTIVE  03/20/2021   IR ANGIOGRAM SELECTIVE EACH ADDITIONAL VESSEL  03/20/2021   IR EMBO ARTERIAL NOT HEMORR HEMANG INC GUIDE ROADMAPPING  03/20/2021   IR RADIOLOGIST EVAL & MGMT  01/30/2021   IR RADIOLOGIST EVAL & MGMT  04/18/2021   IR RADIOLOGIST EVAL & MGMT  07/25/2021   IR US GUIDE VASC ACCESS RIGHT  03/20/2021   LEFT HEART CATH AND CORONARY ANGIOGRAPHY N/A 08/01/2020   Procedure: LEFT HEART CATH AND CORONARY ANGIOGRAPHY;  Surgeon: Elder Negus, MD;  [facility]: [facility];  Service: Cardiovascular;  Laterality: N/A;   LEFT HEART CATH AND CORONARY ANGIOGRAPHY N/A 08/03/2020   Procedure: LEFT HEART CATH AND CORONARY ANGIOGRAPHY;  Surgeon: Elder Negus, MD;  [facility]: [facility];  Service: Cardiovascular;  Laterality: N/A;   PERIPHERAL INTRAVASCULAR LITHOTRIPSY  Right 10/16/2020   Procedure: INTRAVASCULAR LITHOTRIPSY;  Surgeon: Elder Negus, MD;  Location: [facility];  Service: Cardiovascular;  Laterality: Right;  Common and External Iliac   PERIPHERAL VASCULAR INTERVENTION Right 10/16/2020   Procedure: PERIPHERAL VASCULAR INTERVENTION;  Surgeon: Elder Negus, MD;  Location: [facility];  Service: Cardiovascular;  Laterality: Right;  Common and Iliac Stent   POLYPECTOMY  09/16/2022   Procedure: POLYPECTOMY;  Surgeon: Kathi Der, MD;  Location: [facility];  Service: Gastroenterology;;   RADIOLOGY WITH ANESTHESIA N/A 03/20/2021   Procedure: PULMONARY EMBOLIZATION;  Surgeon: Irish Lack, MD;  Location: [facility] OR;  Service: Radiology;  Laterality: N/A;   TEE WITHOUT CARDIOVERSION N/A 12/14/2020   Procedure: TRANSESOPHAGEAL ECHOCARDIOGRAM (TEE);  Surgeon: Elder Negus, MD;  Location: [facility] END[facility]COPY;  Service: Cardiovascular;  Laterality: N/A;    Allergies: Patient has no known allergies.  Medications: Prior to Admission medications   Medication Sig Start Date End Date Taking? Authorizing Provider  acetaminophen (TYLENOL) 500 MG tablet Take 500-1,000 mg by mouth every 6 (six) hours as needed (pain).    [provider]  aspirin EC 81 MG tablet Take 1 tablet (81 mg total) by mouth daily. Swallow whole. 08/26/23   Patwardhan, Anabel Bene, MD  dapagliflozin propanediol (FARXIGA) 10 MG TABS tablet TAKE 1 TABLET(10 MG) BY MOUTH DAILY BEFORE BREAKFAST 08/26/23   Patwardhan, Manish J, MD  DULoxetine (CYMBALTA) 30 MG capsule TAKE 1 CAPSULE BY MOUTH EVERY DAY 11/14/22   Nadara Mustard, MD  ezetimibe (ZETIA) 10 MG tablet Take 1 tablet (10 mg total) by mouth daily. 08/26/23   Patwardhan, Anabel Bene, MD  furosemide (LASIX) 20 MG tablet TAKE 1 TABLET BY MOUTH EVERY DAY 08/24/23   Patwardhan, Manish J, MD  gabapentin (NEURONTIN) 300 MG capsule Take 1 capsule (300 mg total) by mouth 2 (two) times daily. Patient taking differently: Take 300 mg by mouth in the morning and at bedtime. 11/15/20   Azucena Fallen, MD  metoprolol succinate (TOPROL-XL) 25 MG 24 hr tablet TAKE 1 TABLET BY MOUTH EVERY DAY AFTER MEAL 08/26/23   Patwardhan, Anabel Bene, MD  Multiple Vitamins-Minerals (MULTI FOR HIM 50+) TABS Take 1 tablet by mouth See admin  instructions.    [provider]  nitroGLYCERIN (NITROSTAT) 0.4 MG SL tablet Place 1 tablet (0.4 mg total) under the tongue every 5 (five) minutes as needed for chest pain. 08/26/23   Patwardhan, Anabel Bene, MD  rivaroxaban (XARELTO) 2.5 MG TABS tablet Take 1 tablet (2.5 mg total) by mouth 2 (two) times daily. 08/26/23   Patwardhan, Anabel Bene, MD  rosuvastatin (CRESTOR) 40 MG tablet Take 1 tablet (40 mg total) by mouth daily. 08/26/23   Patwardhan, Manish J, MD  sacubitril-valsartan (ENTRESTO) 49-51 MG Take 1 tablet by mouth 2 (two) times daily. 08/26/23   Patwardhan, Anabel Bene, MD     Family History  Problem Relation Age of Onset   Heart disease Mother    Heart disease Father    Cancer Brother     Social History   Socioeconomic History   Marital status: Single    Spouse name: Not on file   Number of children: 1   Years of education: BA   Highest education level: Not on file  Occupational History   Occupation: Disability  Tobacco Use   Smoking status: Every Day    Current packs/day: 0.50    Average packs/day: 0.5 packs/day for 40.0 years (20.0 ttl pk-yrs)  Types: Cigarettes   Smokeless tobacco: Never   Tobacco comments:    3 - 7 a day  Vaping Use   Vaping status: Never Used  Substance and Sexual Activity   Alcohol use: Yes    Comment: ocassional since MI   2-3 x a week   Drug use: Never   Sexual activity: Not Currently  Other Topics Concern   Not on file  Social History Narrative   Left handed   Caffeine use: 3-4 sodas per day, tea very rare.    Social Determinants of Health   Financial Resource Strain: Not on file  Food Insecurity: Not on file  Transportation Needs: Not on file  Physical Activity: Not on file  Stress: Not on file  Social Connections: Not on file     Review of Systems  Constitutional: Negative.   Respiratory: Negative.    Cardiovascular: Negative.   Gastrointestinal: Negative.   Genitourinary: Negative.   Musculoskeletal: Negative.    Neurological: Negative.     Review of Systems: A 12 point ROS discussed and pertinent positives are indicated in the HPI above.  All other systems are negative.   Physical Exam No direct physical exam was performed (except for noted visual exam findings with Video Visits).    Vital Signs: There were no vitals taken for this visit.  Imaging: CT Angio Chest Pulmonary Embolism (PE) W or WO Contrast  Result Date: 09/01/2023 CLINICAL DATA:  Status post transcatheter embolization left lower lobe pulmonary arteriovenous malformation on 03/20/2021. EXAM: CT ANGIOGRAPHY CHEST WITH CONTRAST TECHNIQUE: Multidetector CT imaging of the chest was performed using the standard protocol during bolus administration of intravenous contrast. Multiplanar CT image reconstructions and MIPs were obtained to evaluate the vascular anatomy. RADIATION DOSE REDUCTION: This exam was performed according to the departmental dose-optimization program which includes automated exposure control, adjustment of the mA and/or kV according to patient size and/or use of iterative reconstruction technique. CONTRAST:  75mL ISOVUE-370 IOPAMIDOL (ISOVUE-370) INJECTION 76% COMPARISON:  Prior CTA on 12/14/2020, imaging during embolization on 03/20/2021 and prior postprocedural CTA on 07/19/2021 FINDINGS: Cardiovascular: Stable positioning of an occluder device in a peripheral medial branch of the left lower lobe pulmonary artery. Malformation beyond this branch continues to demonstrate atresia and minimal flow after embolization with some suggestion of a residual small patent arterial branch and small draining vein. No new pulmonary arteriovenous malformations. No evidence of pulmonary embolism. Normal heart size. Stable coronary stent in the distribution of the left circumflex. No pericardial fluid. Normal caliber thoracic aorta. Mediastinum/Nodes: No enlarged mediastinal, hilar, or axillary lymph nodes. Thyroid gland, trachea, and esophagus  demonstrate no significant findings. Lungs/Pleura: What was noted to be a 7 mm nodule in the medial left lower lobe on the recent low-dose lung screening CT is actually part of the vascular occluder device. Stable chronic lung disease and pulmonary scarring as well as some tiny subpleural and fissural nodules. Upper Abdomen: No acute abnormality. Musculoskeletal: No chest wall abnormality. No acute or significant osseous findings. Review of the MIP images confirms the above findings. IMPRESSION: 1. Stable positioning of an occluder device in a peripheral medial branch of the left lower lobe pulmonary artery. Malformation beyond this branch continues to demonstrate atresia and minimal flow after embolization with some suggestion of a residual small patent arterial branch and small draining vein. No new pulmonary arteriovenous malformations. 2. What was noted to be a 7 mm nodule in the medial left lower lobe on the recent low-dose lung screening CT  is actually part of the vascular occluder device. Electronically Signed   By: Irish Lack M.D.   On: 09/01/2023 14:55    Labs:  CBC: No results for input(s): "WBC", "HGB", "HCT", "PLT" in the last 8760 hours.  COAGS: No results for input(s): "INR", "APTT" in the last 8760 hours.  BMP: Recent Labs    08/12/23 1350  NA 141  K 4.6  CL 103  CO2 20  GLUCOSE 73  BUN 24  CALCIUM 9.6  CREATININE 1.17     Assessment and Plan:  I spoke with Mike [name] by phone. I reviewed CTA findings with him. 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 I think some of this is due to better contrast opacification on the current study compared to the prior study in 2022. I told Mike [name] that this certainly does not warrant repeat intervention at this time given appearance, but I would recommend a  repeat CTA in one year, rather than two, to follow this. It is possible that being on chronic anticoagulation may have allowed slight recanalized flow around the occluder device or via other branches/collaterals, but this flow appears minimal at this time and flow is tiny compared to prior to treatment. We will set up a repeat CTA in one year and I will see him afterwards.   Electronically Signed: Reola Calkins 09/02/2023, 1:36 PM    I spent a total of 10 Minutes in remote  clinical consultation, greater than 50% of which was counseling/coordinating care post occlusion of a left lower lobe pulmonary AV malformation.    Visit type: Audio only (telephone). Audio (no video) only due to patient's lack of internet/smartphone capability. Alternative for in-person consultation at [facility], [address] [place], [place], [place]. This visit type was conducted due to national recommendations for restrictions regarding the COVID-19 Pandemic (e.g. social distancing).  This format is felt to be most appropriate for this patient at this time.  All issues noted in this document were discussed and addressed.

## 2023-09-18 ENCOUNTER — Telehealth: Payer: Self-pay

## 2023-09-18 NOTE — Telephone Encounter (Signed)
Alert received from [employer] for bradycardia events.  Pt with [employer] loop recorder.  Previously followed by Dr. Rosemary Holms.  New alert for bradycardia events.  Appears that Pt has untreated sleep apnea that may be causing these events.  Bradycardia events were noted September 17, 2023 between 7:04 am and 7:21 am.  Outreach made to Pt.  Per Pt he was asleep at this time.    No action needed.  Will continue to monitor.

## 2023-09-22 ENCOUNTER — Telehealth: Payer: Self-pay

## 2023-09-22 NOTE — Telephone Encounter (Signed)
[employer] alert received for 16 sec pause during day time hours. Patient called who reports he was awake and asymptomatic. Compliant with Toprol-XL 25 mg daily.   Unable to snipping tool at attach EGM. I have printed EGM and have available for MD to review 09/23/23. Pt made aware of ED precautions.

## 2023-10-01 ENCOUNTER — Ambulatory Visit (INDEPENDENT_AMBULATORY_CARE_PROVIDER_SITE_OTHER): Payer: Medicare Other

## 2023-10-01 DIAGNOSIS — I639 Cerebral infarction, unspecified: Secondary | ICD-10-CM | POA: Diagnosis not present

## 2023-10-01 LAB — CUP PACEART REMOTE DEVICE CHECK
Date Time Interrogation Session: 20241003070446
Implantable Pulse Generator Implant Date: 20220513
Pulse Gen Model: 436066
Pulse Gen Serial Number: 94062842

## 2023-10-16 NOTE — Progress Notes (Signed)
Carelink Summary Report / Loop Recorder 

## 2023-10-21 ENCOUNTER — Telehealth: Payer: Self-pay

## 2023-10-21 NOTE — Telephone Encounter (Signed)
[employer] alert for [place] event at 8:21am on 10/22.   Spoke with patient.  BP rate 27 bpm, 12 sec duration, longest pause approx 3 secs.  Patient has hx of this, he was awake and not symptomatic.  He knows to call the office if experiences any symptoms, lightheaded, dizzy syncopal or near syncopal events.  Dr. Jimmey Ralph aware of hx, will continue to monitor for sxs.

## 2023-11-02 ENCOUNTER — Ambulatory Visit (INDEPENDENT_AMBULATORY_CARE_PROVIDER_SITE_OTHER): Payer: Medicare Other

## 2023-11-02 DIAGNOSIS — I639 Cerebral infarction, unspecified: Secondary | ICD-10-CM

## 2023-11-03 LAB — CUP PACEART REMOTE DEVICE CHECK
Date Time Interrogation Session: 20241105112206
Implantable Pulse Generator Implant Date: 20220513
Pulse Gen Model: 436066
Pulse Gen Serial Number: 94062842

## 2023-11-30 NOTE — Progress Notes (Signed)
[employer] Loop Recorder 

## 2023-12-07 ENCOUNTER — Ambulatory Visit (INDEPENDENT_AMBULATORY_CARE_PROVIDER_SITE_OTHER): Payer: Medicare Other

## 2023-12-07 DIAGNOSIS — I639 Cerebral infarction, unspecified: Secondary | ICD-10-CM | POA: Diagnosis not present

## 2023-12-09 LAB — CUP PACEART REMOTE DEVICE CHECK
Date Time Interrogation Session: 20241209093713
Implantable Pulse Generator Implant Date: 20220513
Pulse Gen Model: 436066
Pulse Gen Serial Number: 94062842

## 2024-01-11 ENCOUNTER — Ambulatory Visit (INDEPENDENT_AMBULATORY_CARE_PROVIDER_SITE_OTHER): Payer: Medicare Other

## 2024-01-11 DIAGNOSIS — I639 Cerebral infarction, unspecified: Secondary | ICD-10-CM

## 2024-01-12 LAB — CUP PACEART REMOTE DEVICE CHECK
Date Time Interrogation Session: 20250113092555
Implantable Pulse Generator Implant Date: 20220513
Pulse Gen Model: 436066
Pulse Gen Serial Number: 94062842

## 2024-01-14 NOTE — Addendum Note (Signed)
Addended by: Geralyn Flash D on: 01/14/2024 03:34 PM   Modules accepted: Orders

## 2024-01-14 NOTE — Progress Notes (Signed)
[employer] Loop Recorder 

## 2024-01-25 ENCOUNTER — Other Ambulatory Visit: Payer: Self-pay | Admitting: Cardiology

## 2024-01-25 DIAGNOSIS — I5020 Unspecified systolic (congestive) heart failure: Secondary | ICD-10-CM

## 2024-01-27 ENCOUNTER — Telehealth: Payer: Self-pay | Admitting: Pharmacy Technician

## 2024-01-27 ENCOUNTER — Other Ambulatory Visit (HOSPITAL_COMMUNITY): Payer: Self-pay

## 2024-01-27 ENCOUNTER — Telehealth: Payer: Self-pay | Admitting: Cardiology

## 2024-01-27 NOTE — Telephone Encounter (Signed)
Pt c/o medication issue:  1. Name of Medication: dapagliflozin propanediol (FARXIGA) 10 MG TABS tablet   2. How are you currently taking this medication (dosage and times per day)?   3. Are you having a reaction (difficulty breathing--STAT)?   4. What is your medication issue?  Patient is needing PA for this medication submitted.

## 2024-01-27 NOTE — Telephone Encounter (Signed)
  Per test claim: The current 30 day co-pay is, $564.04.  No PA needed at this time. This test claim was processed through [facility]- copay amounts may vary at other pharmacies due to pharmacy/plan contracts, or as the patient moves through the different stages of their insurance plan.

## 2024-01-27 NOTE — Telephone Encounter (Signed)
Pharmacy Patient Advocate Encounter   Received notification from Pt Calls Messages that prior authorization for farxiga is required/requested.   Insurance verification completed.   The patient is insured through [employer] .    Per test claim: The current 30 day co-pay is, $564.04.  No PA needed at this time. This test claim was processed through [facility]- copay amounts may vary at other pharmacies due to pharmacy/plan contracts, or as the patient moves through the different stages of their insurance plan.

## 2024-01-28 ENCOUNTER — Telehealth: Payer: Self-pay

## 2024-01-28 ENCOUNTER — Other Ambulatory Visit (HOSPITAL_COMMUNITY): Payer: Self-pay

## 2024-01-28 NOTE — Telephone Encounter (Signed)
Enrolled patient in grant. Faxed info and instructions to pharmacy. Patient made aware of approval via mychart.

## 2024-01-28 NOTE — Telephone Encounter (Signed)
Patient Advocate Encounter   The patient was approved for a [employer] grant that will help cover the cost of FARXIGA Total amount awarded, $10,000.  Effective: 12/29/23 - 12/28/24   [id number] [id number]   Pharmacy provided with approval and processing information. Patient informed via Dorcas Carrow, CPhT  Pharmacy Patient Advocate Specialist  Direct Number: [phone] Fax: [phone]

## 2024-02-04 ENCOUNTER — Telehealth: Payer: Self-pay

## 2024-02-04 NOTE — Telephone Encounter (Signed)
 [employer] alert received for asystole (possibly during sleeping hours). Previous note patient was still asleep at 7:15 am. Attempted to contact patient. No answer, LMTCB.

## 2024-02-04 NOTE — Telephone Encounter (Signed)
 Pt reports he was asleep during this time.  Reports he goes to sleep around 10:30 and wakes up at 8:00.   Pt advised to call in future if he has any symptoms. Pt voiced understanding.

## 2024-02-04 NOTE — Telephone Encounter (Signed)
 Pt called back but has another appt to go to and states he will call back if he misses the call

## 2024-02-24 ENCOUNTER — Telehealth: Payer: Self-pay

## 2024-02-24 NOTE — Telephone Encounter (Signed)
 Alert received from [employer]:  Pause and bradycardia noted February 23, 2024 at 8:47 am.  Outreach made to Pt.  Per Pt he was asleep at this time.  Reminded Pt of appt with Dr. Rosemary Holms tomorrow 02/25/2024.  Pt aware of appt.  Continue to monitor.

## 2024-02-24 NOTE — Progress Notes (Signed)
 [employer] Loop [employer]

## 2024-02-24 NOTE — Addendum Note (Signed)
 Addended by: Geralyn Flash D on: 02/24/2024 10:31 AM   Modules accepted: Orders

## 2024-02-25 ENCOUNTER — Ambulatory Visit: Payer: Medicare Other | Attending: Cardiology | Admitting: Cardiology

## 2024-02-25 ENCOUNTER — Encounter: Payer: Self-pay | Admitting: Cardiology

## 2024-02-25 VITALS — BP 130/60 | HR 102 | Resp 16 | Ht 72.0 in | Wt 193.0 lb

## 2024-02-25 DIAGNOSIS — I639 Cerebral infarction, unspecified: Secondary | ICD-10-CM | POA: Insufficient documentation

## 2024-02-25 DIAGNOSIS — I2510 Atherosclerotic heart disease of native coronary artery without angina pectoris: Secondary | ICD-10-CM | POA: Insufficient documentation

## 2024-02-25 DIAGNOSIS — I5020 Unspecified systolic (congestive) heart failure: Secondary | ICD-10-CM | POA: Diagnosis not present

## 2024-02-25 DIAGNOSIS — I455 Other specified heart block: Secondary | ICD-10-CM | POA: Insufficient documentation

## 2024-02-25 MED ORDER — DAPAGLIFLOZIN PROPANEDIOL 10 MG PO TABS: ORAL_TABLET | ORAL | 3 refills | Status: DC

## 2024-02-25 NOTE — Patient Instructions (Signed)
 Medication Instructions:   *If you need a refill on your cardiac medications before your next appointment, please call your pharmacy*   Lab Work: FASTING LIPID, BMET, AND PRO BNP AT ANY [facility]  If you have labs (blood work) drawn today and your tests are completely normal, you will receive your results only by: MyChart Message (if you have MyChart) OR A paper copy in the mail If you have any lab test that is abnormal or we need to change your treatment, we will call you to review the results.   Testing/Procedures: Your physician has requested that you have an echocardiogram. Echocardiography is a painless test that uses sound waves to create images of your heart. It provides your doctor with information about the size and shape of your heart and how well your heart's chambers and valves are working. This procedure takes approximately one hour. There are no restrictions for this procedure. Please do NOT wear cologne, perfume, aftershave, or lotions (deodorant is allowed). Please arrive 15 minutes prior to your appointment time.  Please note: We ask at that you not bring children with you during ultrasound (echo/ vascular) testing. Due to room size and safety concerns, children are not allowed in the ultrasound rooms during exams. Our front office staff cannot provide observation of children in our lobby area while testing is being conducted. An adult accompanying a patient to their appointment will only be allowed in the ultrasound room at the discretion of the ultrasound technician under special circumstances. We apologize for any inconvenience.    Follow-Up: At [facility], you and your health needs are our priority.  As part of our continuing mission to provide you with exceptional heart care, we have created designated Provider Care Teams.  These Care Teams include your primary Cardiologist (physician) and Advanced Practice Providers (APPs -  Physician Assistants and Nurse  Practitioners) who all work together to provide you with the care you need, when you need it.  We recommend signing up for the patient portal called "MyChart".  Sign up information is provided on this After Visit Summary.  MyChart is used to connect with patients for Virtual Visits (Telemedicine).  Patients are able to view lab/test results, encounter notes, upcoming appointments, etc.  Non-urgent messages can be sent to your provider as well.   To learn more about what you can do with MyChart, go to [email or web address].    Your next appointment:   3 month(s) DR [facility]     Other Instructions

## 2024-02-25 NOTE — Progress Notes (Signed)
 Cardiology Office Note:  .   Date:  02/25/2024  ID:  Mike [name], DOB [birth year], MRN [id number] PCP: Aliene Beams, MD  [facility] Providers Cardiologist:  Truett Mainland, MD PCP: Aliene Beams, MD  Chief Complaint  Patient presents with   PAD   Follow-up    6 month      History of Present Illness: .    Mike [name] is a 63 y.o. male with CAD, culprit (RCA) and nonculprit (LCx) PCI after STEMI 07/2020, HFrEF, moderate MR, h/o cardiac arrest 2/2 Torsades post PCI during index hospitalization, PAD with critical limb ischemia s/p Rt iliac revascularization, and s/p Lt AKA amputation for critical limb ischemia,  severe bilateral renal artery stenoses w/h/o CIN (09/2020), multifocal stroke s/p tPA with resolution of neurodeficit (11/2020), large AV pulmonary fistula-s/p closure by Dr. Fredia Sorrow (02/2021), small PFO  Patient is doing well, denies any complaints of chest pain, shortness of breath, right leg edema.  He walks with crutches several times during the day without any symptoms or complaints. The presyncope or syncope episodes.     Vitals:   02/25/24 1053  BP: 130/60  Pulse: (!) 102  Resp: 16  SpO2: 97%     ROS:  Review of Systems  Cardiovascular:  Negative for chest pain, dyspnea on exertion, leg swelling, palpitations and syncope.     Studies Reviewed: Marland Kitchen        EKG 02/25/2024: Normal sinus rhythm Inferior infarct , probably old Anteroseptal infarct possible, probably old When compared with ECG of 08/22/2023, No significant change was found        Independently interpreted 07/2023: Chol 119, TG 213, HDL 44, LDL 41 Cr 1.1  Echocardiogram 10/09/2022:  Left ventricle cavity is moderately dilated. Normal left ventricular wall  thickness. Moderate basal inferolateral and mild global hypokinesis. LVEF  40-45%. Doppler evidence of grade I (impaired) diastolic dysfunction,  normal LAP.  Mild mitral valve leaflet thickening. Moderate (grade  II) mitral  regurgitation.  Normal right atrial pressure.  Compared to previous study on 06/25/2022, LVEF has improved from 35-40%.  Mild PH not appreciated.     Physical Exam:   Physical Exam Vitals and nursing note reviewed.  Constitutional:      General: He is not in acute distress. Neck:     Vascular: No JVD.  Cardiovascular:     Rate and Rhythm: Normal rate and regular rhythm.     Heart sounds: Normal heart sounds. No murmur heard. Pulmonary:     Effort: Pulmonary effort is normal.     Breath sounds: Normal breath sounds. No wheezing or rales.  Musculoskeletal:     Right lower leg: No edema.     Comments: Lt AKA      VISIT DIAGNOSES:   ICD-10-CM   1. Cryptogenic stroke (HCC)  I63.9 EKG 12-Lead    ECHOCARDIOGRAM COMPLETE    Basic Metabolic Panel (BMET)    Lipid Profile    Pro b natriuretic peptide (BNP)    Pro b natriuretic peptide (BNP)    Lipid Profile    Basic Metabolic Panel (BMET)    2. Coronary artery disease involving native coronary artery of native heart without angina pectoris  I25.10 EKG 12-Lead    ECHOCARDIOGRAM COMPLETE    Basic Metabolic Panel (BMET)    Lipid Profile    Pro b natriuretic peptide (BNP)    Pro b natriuretic peptide (BNP)    Lipid Profile    Basic Metabolic Panel (BMET)  3. HFrEF (heart failure with reduced ejection fraction) (HCC)  I50.20 EKG 12-Lead    ECHOCARDIOGRAM COMPLETE    Basic Metabolic Panel (BMET)    Lipid Profile    Pro b natriuretic peptide (BNP)    Pro b natriuretic peptide (BNP)    Lipid Profile    Basic Metabolic Panel (BMET)    dapagliflozin propanediol (FARXIGA) 10 MG TABS tablet    4. Sinus pause  I45.5        ASSESSMENT AND PLAN: .    Mike [name] is a 63 y.o. male with CAD, culprit (RCA) and nonculprit (LCx) PCI after STEMI 07/2020, HFrEF, moderate MR, h/o cardiac arrest 2/2 Torsades post PCI during index hospitalization, PAD with critical limb ischemia s/p Rt iliac revascularization, and s/p  Lt AKA amputation for critical limb ischemia,  severe bilateral renal artery stenoses w/h/o CIN (09/2020), stroke s/p tPA with resolution of neurodeficit (11/2020), multifocal stroke on MRI suspicious for cardioembolic source, small PFO/secundum ASD and large pulmonary AV fistula now s/p closure by Dr. Fredia Sorrow (02/2021), ILR placement (04/2021), asymptomatic sinus pauses  HFrEF, secondary mitral regurgitation: LVEF 40-45% (09/2022) Clinically, appears fairly compensated. On relatively low-dose GDMT for HFrEF given renal dysfunction and known bilateral renal artery stenosis, as well as asymptomatic sinus pauses. Currently on Entresto 49-51 mg bid, metoprolol succinate 25 mg daily, Farxiga 10 mg daily. Continue the same. Will check echocardiogram.   Asymptomatic sinus pauses: Likely due to OSA. He is not interested in sleep testing. Will turn off alerts for sinus pauses.   Stroke: Multifocal stroke with small PFO/secundum ASD but large pulmonary AV fistula (11/2020), now s/p closure by Dr. Fredia Sorrow (02/2021) Repeat TCD bubble study as pr Dr. Marlis Edelson recommendations S/p ILR placement (04/2021). No Afib detected thus far    PAD: Successful revascularization of right common iliac and right external iliac artery (09/2020) S/p Lt AKA.  No critical limb ischemia, although he has pre-ulcerative calloses on right sole.  Continue Aspirin 81 mg daily and Xarelto 2.5 mg bid.    CAD: S/p STEMI 07/2020. Currently no angina symptoms Continue aspirin 81 mg daily and Xarelto 2.5 mg bid. Continue Crestor 40 mg, Zetia 10 mg daily. Chol 119, TG 213, HDL 44, LDL 41 (07/2023).         No orders of the defined types were placed in this encounter.    F/u in 3 months  Signed, Elder Negus, MD

## 2024-02-26 ENCOUNTER — Ambulatory Visit: Payer: Self-pay | Admitting: Cardiology

## 2024-02-29 ENCOUNTER — Telehealth: Payer: Self-pay | Admitting: Cardiology

## 2024-02-29 NOTE — Telephone Encounter (Signed)
*  STAT* If patient is at the pharmacy, call can be transferred to refill team.   1. Which medications need to be refilled? (please list name of each medication and dose if known)   dapagliflozin propanediol (FARXIGA) 10 MG TABS tablet    2. Which pharmacy/location (including street and city if local pharmacy) is medication to be sent to? [id number] - [place], [place] - [address]   3. Do they need a 30 day or 90 day supply? 90

## 2024-03-07 ENCOUNTER — Telehealth: Payer: Self-pay

## 2024-03-07 NOTE — Telephone Encounter (Signed)
 2 alerts for Brady/Pause events on ILR.  Patient has known hx. Refuses work up for OSA. Sinus Pause alerts were turned off. This event occurred 7:24am on 3/9.  LM to assess patient' sleep/wake/sx pattern:  If patient confirms he was sleeping, this is consistent with OSA. No further action needed.    Please make patient aware to call our office if he were to experience symptoms of lightheadedness, dizziness, syncope or near syncopal event.

## 2024-03-07 NOTE — Telephone Encounter (Signed)
 Spoke with patient.   Feels like he was asleep. He has had no symptoms.   Normal sleep wake times:   ( usually 11pm - 8:30is). Knows that these pauses likely correlate with sleep apnea.  He will call us if he starts to develop any symptoms.   No further action needed at this point.

## 2024-03-12 ENCOUNTER — Other Ambulatory Visit: Payer: Self-pay | Admitting: Cardiology

## 2024-03-12 DIAGNOSIS — I2510 Atherosclerotic heart disease of native coronary artery without angina pectoris: Secondary | ICD-10-CM

## 2024-03-12 DIAGNOSIS — I739 Peripheral vascular disease, unspecified: Secondary | ICD-10-CM

## 2024-03-16 ENCOUNTER — Ambulatory Visit (HOSPITAL_COMMUNITY): Payer: Medicare Other | Attending: Cardiology

## 2024-03-16 DIAGNOSIS — I639 Cerebral infarction, unspecified: Secondary | ICD-10-CM | POA: Insufficient documentation

## 2024-03-16 DIAGNOSIS — I509 Heart failure, unspecified: Secondary | ICD-10-CM

## 2024-03-16 DIAGNOSIS — I5020 Unspecified systolic (congestive) heart failure: Secondary | ICD-10-CM | POA: Diagnosis present

## 2024-03-16 DIAGNOSIS — I2510 Atherosclerotic heart disease of native coronary artery without angina pectoris: Secondary | ICD-10-CM | POA: Insufficient documentation

## 2024-03-16 LAB — ECHOCARDIOGRAM COMPLETE
Area-P 1/2: 4.23 cm2
Calc EF: 36.6 %
S' Lateral: 5.4 cm
Single Plane A2C EF: 44.6 %
Single Plane A4C EF: 30.2 %

## 2024-03-17 NOTE — Progress Notes (Signed)
 Please disregard above lab results comments. Please see below regarding echo read.  In spite of no significant heart failure symptoms, EF remains reduced from prior heart attack. I do not think there is much room to further uptitrate medications for heart failure. QRS is not wide enough to benefit from BiV pacing. Recommend referral to heart failure clinic to see if he would benefit from any other options, including barostimulation.  Thanks MJP

## 2024-03-17 NOTE — Progress Notes (Signed)
 Hemoglobin A1c is higher than 2 years ago.  This is barely in prediabetic range.  I do not think you need any additional medications, would recommend low carbohydrate diet. Cholesterol is very well-controlled. Hemoglobin and renal function are stable  Thanks MJP

## 2024-03-18 ENCOUNTER — Other Ambulatory Visit: Payer: Self-pay | Admitting: Cardiology

## 2024-03-18 DIAGNOSIS — I5020 Unspecified systolic (congestive) heart failure: Secondary | ICD-10-CM

## 2024-03-21 ENCOUNTER — Ambulatory Visit (INDEPENDENT_AMBULATORY_CARE_PROVIDER_SITE_OTHER): Payer: Self-pay

## 2024-03-21 DIAGNOSIS — I639 Cerebral infarction, unspecified: Secondary | ICD-10-CM | POA: Diagnosis not present

## 2024-03-21 LAB — CUP PACEART REMOTE DEVICE CHECK
Date Time Interrogation Session: 20250324082232
Implantable Pulse Generator Implant Date: 20220513
Pulse Gen Model: 436066
Pulse Gen Serial Number: 94062842

## 2024-03-21 NOTE — Progress Notes (Signed)
 I personally spoke with the patient over the phone and informed him of his actual echocardiogram results.  He wants to hold off heart failure clinic referral for now.  Will follow-up with me in May.  No further action needed at this time.  Thanks MJP

## 2024-03-23 ENCOUNTER — Telehealth: Payer: Self-pay | Admitting: Cardiology

## 2024-03-23 DIAGNOSIS — I5020 Unspecified systolic (congestive) heart failure: Secondary | ICD-10-CM

## 2024-03-23 MED ORDER — DAPAGLIFLOZIN PROPANEDIOL 10 MG PO TABS: ORAL_TABLET | ORAL | 3 refills | Status: DC

## 2024-03-23 NOTE — Telephone Encounter (Signed)
 Spoke with patient and he stated Mike [name] is too expensive. Informed patient he has been approved for [employer] grant and information was faxed to [facility]. He was also given grant information

## 2024-03-23 NOTE — Telephone Encounter (Signed)
 Pt c/o medication issue:  1. Name of Medication:   dapagliflozin propanediol (FARXIGA) 10 MG TABS tablet   2. How are you currently taking this medication (dosage and times per day)?   As prescribed  3. Are you having a reaction (difficulty breathing--STAT)?   No  4. What is your medication issue?   Patient stated this medication is too expensive and wants to get a coupon, assistance getting this medication or samples.  Patient stated he has 2 tablets left.

## 2024-04-02 ENCOUNTER — Other Ambulatory Visit: Payer: Self-pay | Admitting: Cardiology

## 2024-04-02 DIAGNOSIS — I5020 Unspecified systolic (congestive) heart failure: Secondary | ICD-10-CM

## 2024-04-07 LAB — BASIC METABOLIC PANEL WITH GFR
BUN/Creatinine Ratio: 19 (ref 10–24)
BUN: 21 mg/dL (ref 8–27)
CO2: 18 mmol/L — ABNORMAL LOW (ref 20–29)
Calcium: 9.4 mg/dL (ref 8.6–10.2)
Chloride: 105 mmol/L (ref 96–106)
Creatinine, Ser: 1.11 mg/dL (ref 0.76–1.27)
Glucose: 110 mg/dL — ABNORMAL HIGH (ref 70–99)
Potassium: 4.6 mmol/L (ref 3.5–5.2)
Sodium: 142 mmol/L (ref 134–144)
eGFR: 74 mL/min/{1.73_m2} (ref >59)

## 2024-04-07 LAB — LIPID PANEL
Chol/HDL Ratio: 3.3 ratio (ref 0.0–5.0)
Cholesterol, Total: 134 mg/dL (ref 100–199)
HDL: 41 mg/dL (ref >39)
LDL Chol Calc (NIH): 61 mg/dL (ref 0–99)
Triglycerides: 193 mg/dL — ABNORMAL HIGH (ref 0–149)
VLDL Cholesterol Cal: 32 mg/dL (ref 5–40)

## 2024-04-07 LAB — PRO B NATRIURETIC PEPTIDE: NT-Pro BNP: 939 pg/mL — ABNORMAL HIGH (ref 0–210)

## 2024-04-08 ENCOUNTER — Encounter: Payer: Self-pay | Admitting: Cardiology

## 2024-04-25 ENCOUNTER — Ambulatory Visit (INDEPENDENT_AMBULATORY_CARE_PROVIDER_SITE_OTHER): Payer: Self-pay

## 2024-04-25 DIAGNOSIS — I639 Cerebral infarction, unspecified: Secondary | ICD-10-CM

## 2024-04-25 LAB — CUP PACEART REMOTE DEVICE CHECK
Date Time Interrogation Session: 20250428100157
Implantable Pulse Generator Implant Date: 20220513
Pulse Gen Model: 436066
Pulse Gen Serial Number: 94062842

## 2024-04-29 ENCOUNTER — Other Ambulatory Visit (HOSPITAL_COMMUNITY): Payer: Self-pay

## 2024-04-29 ENCOUNTER — Encounter: Payer: Self-pay | Admitting: Cardiology

## 2024-04-29 ENCOUNTER — Ambulatory Visit: Payer: Medicare Other | Attending: Cardiology | Admitting: Cardiology

## 2024-04-29 ENCOUNTER — Other Ambulatory Visit: Payer: Self-pay

## 2024-04-29 VITALS — BP 150/77 | HR 77 | Ht 72.0 in | Wt 196.0 lb

## 2024-04-29 DIAGNOSIS — I739 Peripheral vascular disease, unspecified: Secondary | ICD-10-CM | POA: Insufficient documentation

## 2024-04-29 DIAGNOSIS — I5020 Unspecified systolic (congestive) heart failure: Secondary | ICD-10-CM | POA: Diagnosis present

## 2024-04-29 DIAGNOSIS — I2510 Atherosclerotic heart disease of native coronary artery without angina pectoris: Secondary | ICD-10-CM | POA: Diagnosis not present

## 2024-04-29 MED ORDER — SPIRONOLACTONE 25 MG PO TABS
12.5000 mg | ORAL_TABLET | Freq: Every day | ORAL | 1 refills | Status: DC
  Filled 2024-04-29 (×2): qty 45, 90d supply, fill #0

## 2024-04-29 MED ORDER — ENTRESTO 49-51 MG PO TABS
1.0000 | ORAL_TABLET | Freq: Two times a day (BID) | ORAL | 3 refills | Status: DC
  Filled 2024-04-29: qty 180, 90d supply, fill #0
  Filled 2024-05-02: qty 60, 30d supply, fill #0

## 2024-04-29 MED ORDER — RIVAROXABAN 2.5 MG PO TABS
2.5000 mg | ORAL_TABLET | Freq: Two times a day (BID) | ORAL | 3 refills | Status: DC
  Filled 2024-04-29 (×2): qty 60, 30d supply, fill #0

## 2024-04-29 NOTE — Progress Notes (Unsigned)
 Cardiology Office Note:  .   Date:  04/29/2024  ID:  Mike [name], DOB 06/01/1961, MRN [id number] PCP: Dorena Gander, MD  [facility] Providers Cardiologist:  Fransico Ivy, MD PCP: Dorena Gander, MD  No chief complaint on file.     History of Present Illness: .    Mike [name] is a 63 y.o. male with CAD, culprit (RCA) and nonculprit (LCx) PCI after STEMI 07/2020, HFrEF, moderate MR, h/o cardiac arrest 2/2 Torsades post PCI during index hospitalization, PAD with critical limb ischemia s/p Rt iliac revascularization, and s/p Lt AKA amputation for critical limb ischemia,  severe bilateral renal artery stenoses w/h/o CIN (09/2020), multifocal stroke s/p tPA with resolution of neurodeficit (11/2020), large AV pulmonary fistula-s/p closure by Dr. Nereida Banning (02/2021), small PFO  Patient is doing well, denies any complaints of chest pain, shortness of breath, right leg edema.  He walks with crutches several times during the day without any symptoms or complaints. The presyncope or syncope episodes.     Vitals:   04/29/24 1547  BP: (!) 150/77  Pulse: 77  SpO2: 93%      ROS:  Review of Systems  Cardiovascular:  Negative for chest pain, dyspnea on exertion, leg swelling, palpitations and syncope.     Studies Reviewed: Mike [name]        EKG 02/25/2024: Normal sinus rhythm Inferior infarct , probably old Anteroseptal infarct possible, probably old When compared with ECG of 08/22/2023, No significant change was found     Independently interpreted 03/2024: Chol 134, TG 193, HDL 41, LDL 61 Cr 1.11 BNP 939 (1226 in 07/2022)  Echocardiogram 02/2024:  1. Left ventricular ejection fraction, by estimation, is 35 to 40%. Left  ventricular ejection fraction by 2D MOD biplane is 36.6 %. The left  ventricle has moderately decreased function. The left ventricle  demonstrates regional wall motion abnormalities  (see scoring diagram/findings for description). The left ventricular   internal cavity size was moderately dilated. Left ventricular diastolic  parameters are consistent with Grade I diastolic dysfunction (impaired  relaxation). There is severe akinesis of  the left ventricular, entire inferior wall, lateral wall and inferolateral  wall.   2. Right ventricular systolic function is normal. The right ventricular  size is normal. Tricuspid regurgitation signal is inadequate for assessing  PA pressure.   3. The mitral valve is grossly normal. Mild mitral valve regurgitation.   4. The aortic valve is tricuspid. Aortic valve regurgitation is not  visualized. Aortic valve sclerosis is present, with no evidence of aortic  valve stenosis.   5. The inferior vena cava is normal in size with greater than 50%  respiratory variability, suggesting right atrial pressure of 3 mmHg.   Comparison(s): Changes from prior study are noted. 06/25/2022: LVEF 40-45%.      Independently interpreted 07/2023: Chol 119, TG 213, HDL 44, LDL 41 Cr 1.1  Echocardiogram 10/09/2022:  Left ventricle cavity is moderately dilated. Normal left ventricular wall  thickness. Moderate basal inferolateral and mild global hypokinesis. LVEF  40-45%. Doppler evidence of grade I (impaired) diastolic dysfunction,  normal LAP.  Mild mitral valve leaflet thickening. Moderate (grade II) mitral  regurgitation.  Normal right atrial pressure.  Compared to previous study on 06/25/2022, LVEF has improved from 35-40%.  Mild PH not appreciated.     Physical Exam:   Physical Exam Vitals and nursing note reviewed.  Constitutional:      General: He is not in acute distress. Neck:     Vascular:  No JVD.  Cardiovascular:     Rate and Rhythm: Normal rate and regular rhythm.     Heart sounds: Normal heart sounds. No murmur heard. Pulmonary:     Effort: Pulmonary effort is normal.     Breath sounds: Normal breath sounds. No wheezing or rales.  Musculoskeletal:     Right lower leg: No edema.     Comments:  Lt AKA      VISIT DIAGNOSES: No diagnosis found.    ASSESSMENT AND PLAN: .    Mike [name] is a 63 y.o. male with CAD, culprit (RCA) and nonculprit (LCx) PCI after STEMI 07/2020, HFrEF, moderate MR, h/o cardiac arrest 2/2 Torsades post PCI during index hospitalization, PAD with critical limb ischemia s/p Rt iliac revascularization, and s/p Lt AKA amputation for critical limb ischemia,  severe bilateral renal artery stenoses w/h/o CIN (09/2020), stroke s/p tPA with resolution of neurodeficit (11/2020), multifocal stroke on MRI suspicious for cardioembolic source, small PFO/secundum ASD and large pulmonary AV fistula now s/p closure by Dr. Nereida Banning (02/2021), ILR placement (04/2021), asymptomatic sinus pauses  HFrEF, secondary mitral regurgitation: LVEF 40-45% (09/2022) Clinically, appears fairly compensated. On relatively low-dose GDMT for HFrEF given renal dysfunction and known bilateral renal artery stenosis, as well as asymptomatic sinus pauses. Currently on Entresto  49-51 mg bid, metoprolol  succinate 25 mg daily, Farxiga  10 mg daily. Continue the same. Will check echocardiogram.   Asymptomatic sinus pauses: Likely due to OSA. He is not interested in sleep testing. Will turn off alerts for sinus pauses.   Stroke: Multifocal stroke with small PFO/secundum ASD but large pulmonary AV fistula (11/2020), now s/p closure by Dr. Nereida Banning (02/2021) Repeat TCD bubble study as pr Dr. Wynelle Heather recommendations S/p ILR placement (04/2021). No Afib detected thus far    PAD: Successful revascularization of right common iliac and right external iliac artery (09/2020) S/p Lt AKA.  No critical limb ischemia, although he has pre-ulcerative calloses on right sole.  Continue Aspirin  81 mg daily and Xarelto  2.5 mg bid.    CAD: S/p STEMI 07/2020. Currently no angina symptoms Continue aspirin  81 mg daily and Xarelto  2.5 mg bid. Continue Crestor  40 mg, Zetia  10 mg daily. Chol 119, TG 213, HDL 44,  LDL 41 (07/2023).         No orders of the defined types were placed in this encounter.    F/u in 3 months  Signed, Cody Das, MD

## 2024-04-29 NOTE — Patient Instructions (Signed)
 Medication Instructions:  Your physician has recommended you make the following change in your medication:  Start spironolactone  12.5 mg by mouth daily   *If you need a refill on your cardiac medications before your next appointment, please call your pharmacy*  Lab Work: Have lab work done in one week --BMP and BNP.  Can be done at any [facility] location.  There is an office on the first floor of our building.  It is not fasting If you have labs (blood work) drawn today and your tests are completely normal, you will receive your results only by: MyChart Message (if you have MyChart) OR A paper copy in the mail If you have any lab test that is abnormal or we need to change your treatment, we will call you to review the results.  Testing/Procedures: none  Follow-Up: At [facility], you and your health needs are our priority.  As part of our continuing mission to provide you with exceptional heart care, our providers are all part of one team.  This team includes your primary Cardiologist (physician) and Advanced Practice Providers or APPs (Physician Assistants and Nurse Practitioners) who all work together to provide you with the care you need, when you need it.  Your next appointment:   3 month(s)  Provider:   Dr Filiberto Hug    We recommend signing up for the patient portal called "MyChart".  Sign up information is provided on this After Visit Summary.  MyChart is used to connect with patients for Virtual Visits (Telemedicine).  Patients are able to view lab/test results, encounter notes, upcoming appointments, etc.  Non-urgent messages can be sent to your provider as well.   To learn more about what you can do with MyChart, go to [email or web address].   Other Instructions

## 2024-05-01 ENCOUNTER — Encounter: Payer: Self-pay | Admitting: Cardiology

## 2024-05-02 ENCOUNTER — Other Ambulatory Visit (HOSPITAL_COMMUNITY): Payer: Self-pay

## 2024-05-06 ENCOUNTER — Other Ambulatory Visit: Payer: Self-pay

## 2024-05-06 DIAGNOSIS — I5020 Unspecified systolic (congestive) heart failure: Secondary | ICD-10-CM

## 2024-05-07 ENCOUNTER — Encounter: Payer: Self-pay | Admitting: Cardiology

## 2024-05-07 LAB — BASIC METABOLIC PANEL WITH GFR
BUN/Creatinine Ratio: 19 (ref 10–24)
BUN: 21 mg/dL (ref 8–27)
CO2: 17 mmol/L — ABNORMAL LOW (ref 20–29)
Calcium: 9.2 mg/dL (ref 8.6–10.2)
Chloride: 105 mmol/L (ref 96–106)
Creatinine, Ser: 1.12 mg/dL (ref 0.76–1.27)
Glucose: 111 mg/dL — ABNORMAL HIGH (ref 70–99)
Potassium: 4.7 mmol/L (ref 3.5–5.2)
Sodium: 140 mmol/L (ref 134–144)
eGFR: 73 mL/min/{1.73_m2} (ref >59)

## 2024-05-07 LAB — PRO B NATRIURETIC PEPTIDE: NT-Pro BNP: 1007 pg/mL — ABNORMAL HIGH (ref 0–210)

## 2024-05-10 NOTE — Progress Notes (Signed)
 [employer] Loop [employer]

## 2024-05-26 ENCOUNTER — Ambulatory Visit (INDEPENDENT_AMBULATORY_CARE_PROVIDER_SITE_OTHER): Payer: Self-pay

## 2024-05-26 DIAGNOSIS — I639 Cerebral infarction, unspecified: Secondary | ICD-10-CM

## 2024-05-26 LAB — CUP PACEART REMOTE DEVICE CHECK
Date Time Interrogation Session: 20250529091211
Implantable Pulse Generator Implant Date: 20220513
Pulse Gen Model: 436066
Pulse Gen Serial Number: 94062842

## 2024-06-01 ENCOUNTER — Other Ambulatory Visit (HOSPITAL_COMMUNITY): Payer: Self-pay

## 2024-06-01 ENCOUNTER — Other Ambulatory Visit: Payer: Self-pay | Admitting: Cardiology

## 2024-06-01 DIAGNOSIS — I739 Peripheral vascular disease, unspecified: Secondary | ICD-10-CM

## 2024-06-01 DIAGNOSIS — I5020 Unspecified systolic (congestive) heart failure: Secondary | ICD-10-CM

## 2024-06-01 DIAGNOSIS — I2510 Atherosclerotic heart disease of native coronary artery without angina pectoris: Secondary | ICD-10-CM

## 2024-06-01 MED ORDER — ENTRESTO 49-51 MG PO TABS
1.0000 | ORAL_TABLET | Freq: Two times a day (BID) | ORAL | 3 refills | Status: DC
  Filled 2024-06-01: qty 60, 30d supply, fill #0

## 2024-06-01 MED ORDER — RIVAROXABAN 2.5 MG PO TABS
2.5000 mg | ORAL_TABLET | Freq: Two times a day (BID) | ORAL | 3 refills | Status: DC
  Filled 2024-06-01: qty 180, 90d supply, fill #0

## 2024-06-01 MED ORDER — DAPAGLIFLOZIN PROPANEDIOL 10 MG PO TABS
10.0000 mg | ORAL_TABLET | Freq: Every day | ORAL | 3 refills | Status: AC
Start: 2024-06-01 — End: 2025-06-01
  Filled 2024-06-01 – 2024-06-13 (×3): qty 90, 90d supply, fill #0

## 2024-06-01 NOTE — Telephone Encounter (Signed)
 *  STAT* If patient is at the pharmacy, call can be transferred to refill team.   1. Which medications need to be refilled? (please list name of each medication and dose if known)   dapagliflozin  propanediol (FARXIGA ) 10 MG TABS tablet    sacubitril -valsartan  (ENTRESTO ) 49-51 MG    rivaroxaban  (XARELTO ) 2.5 MG TABS tablet   2. Would you like to learn more about the convenience, safety, & potential cost savings by using the [facility]? Yes     3. Are you open to using the [facility] (Type [facility]. ). Yes     4. Which pharmacy/location (including street and city if local pharmacy) is medication to be sent to? [facility] at [address]   5. Do they need a 30 day or 90 day supply? 90 days

## 2024-06-01 NOTE — Telephone Encounter (Signed)
 RX sent to requested Pharmacy

## 2024-06-03 ENCOUNTER — Other Ambulatory Visit (HOSPITAL_COMMUNITY): Payer: Self-pay

## 2024-06-05 ENCOUNTER — Ambulatory Visit: Payer: Self-pay | Admitting: Cardiology

## 2024-06-13 ENCOUNTER — Other Ambulatory Visit (HOSPITAL_COMMUNITY): Payer: Self-pay

## 2024-06-16 NOTE — Progress Notes (Signed)
 [employer] Loop [employer]

## 2024-06-23 ENCOUNTER — Other Ambulatory Visit (HOSPITAL_COMMUNITY): Payer: Self-pay

## 2024-06-27 ENCOUNTER — Ambulatory Visit: Payer: Self-pay

## 2024-06-27 DIAGNOSIS — I5020 Unspecified systolic (congestive) heart failure: Secondary | ICD-10-CM | POA: Diagnosis not present

## 2024-06-27 LAB — CUP PACEART REMOTE DEVICE CHECK
Date Time Interrogation Session: 20250630095010
Implantable Pulse Generator Implant Date: 20220513
Pulse Gen Model: 436066
Pulse Gen Serial Number: 94062842

## 2024-07-03 ENCOUNTER — Ambulatory Visit: Payer: Self-pay | Admitting: Cardiology

## 2024-07-05 ENCOUNTER — Telehealth: Payer: Self-pay | Admitting: Cardiology

## 2024-07-05 DIAGNOSIS — I5020 Unspecified systolic (congestive) heart failure: Secondary | ICD-10-CM

## 2024-07-05 MED ORDER — SACUBITRIL-VALSARTAN 49-51 MG PO TABS: 1.0000 | ORAL_TABLET | Freq: Two times a day (BID) | ORAL | 3 refills | Status: AC

## 2024-07-05 NOTE — Telephone Encounter (Signed)
 Pt's medication was sent to pt's pharmacy as requested. Confirmation received.

## 2024-07-05 NOTE — Telephone Encounter (Signed)
*  STAT* If patient is at the pharmacy, call can be transferred to refill team.   1. Which medications need to be refilled? (please list name of each medication and dose if known)   sacubitril -valsartan  (ENTRESTO ) 49-51 MG   2. Which pharmacy/location (including street and city if local pharmacy) is medication to be sent to? [id number] [place], [place] - [address]   3. Do they need a 30 day or 90 day supply? 90

## 2024-07-15 NOTE — Progress Notes (Signed)
 Carelink Summary Report / Loop Recorder

## 2024-07-20 ENCOUNTER — Telehealth: Payer: Self-pay | Admitting: Cardiology

## 2024-07-20 MED ORDER — SPIRONOLACTONE 25 MG PO TABS: 12.5000 mg | ORAL_TABLET | Freq: Every day | ORAL | 3 refills | Status: AC

## 2024-07-20 NOTE — Telephone Encounter (Signed)
 Pt's medication was sent to pt's pharmacy as requested. Confirmation received.

## 2024-07-20 NOTE — Telephone Encounter (Signed)
*  STAT* If patient is at the pharmacy, call can be transferred to refill team.   1. Which medications need to be refilled? (please list name of each medication and dose if known)   spironolactone  (ALDACTONE ) 25 MG tablet    2. Which pharmacy/location (including street and city if local pharmacy) is medication to be sent to? [id number] [place], [place] - [address]   3. Do they need a 30 day or 90 day supply? 90

## 2024-07-22 ENCOUNTER — Other Ambulatory Visit: Payer: Self-pay | Admitting: Cardiology

## 2024-07-22 DIAGNOSIS — I5020 Unspecified systolic (congestive) heart failure: Secondary | ICD-10-CM

## 2024-07-28 ENCOUNTER — Ambulatory Visit (INDEPENDENT_AMBULATORY_CARE_PROVIDER_SITE_OTHER): Payer: Self-pay

## 2024-07-28 DIAGNOSIS — I639 Cerebral infarction, unspecified: Secondary | ICD-10-CM

## 2024-07-28 LAB — CUP PACEART REMOTE DEVICE CHECK
Date Time Interrogation Session: 20250731103733
Implantable Pulse Generator Implant Date: 20220513
Pulse Gen Model: 436066
Pulse Gen Serial Number: 94062842

## 2024-08-01 ENCOUNTER — Ambulatory Visit: Attending: Cardiology | Admitting: Cardiology

## 2024-08-01 ENCOUNTER — Encounter: Payer: Self-pay | Admitting: Cardiology

## 2024-08-01 VITALS — BP 102/60 | HR 80 | Ht 72.0 in | Wt 190.0 lb

## 2024-08-01 DIAGNOSIS — I739 Peripheral vascular disease, unspecified: Secondary | ICD-10-CM | POA: Insufficient documentation

## 2024-08-01 DIAGNOSIS — I5020 Unspecified systolic (congestive) heart failure: Secondary | ICD-10-CM | POA: Insufficient documentation

## 2024-08-01 DIAGNOSIS — I2510 Atherosclerotic heart disease of native coronary artery without angina pectoris: Secondary | ICD-10-CM | POA: Diagnosis present

## 2024-08-01 NOTE — Progress Notes (Signed)
 Cardiology Office Note:  .   Date:  08/01/2024  ID:  Mike [name], DOB [birth year], MRN [id number] PCP: Rolinda Millman, MD  [facility] Providers Cardiologist:  Newman Lawrence, MD PCP: Rolinda Millman, MD  Chief Complaint  Patient presents with   Coronary Artery Disease   HFeEF      History of Present Illness: .    Mike [name] is a 63 y.o. male with CAD, culprit (RCA) and nonculprit (LCx) PCI after STEMI 07/2020, HFrEF, moderate MR, h/o cardiac arrest 2/2 Torsades post PCI during index hospitalization, PAD with critical limb ischemia s/p Rt iliac revascularization, and s/p Lt AKA amputation for critical limb ischemia,  severe bilateral renal artery stenoses w/h/o CIN (09/2020), multifocal stroke s/p tPA with resolution of neurodeficit (11/2020), large AV pulmonary fistula-s/p closure by Dr. Luverne (02/2021), small PFO  Patient is doing well, denies any shortness of breath or leg swelling. Reviewed recent lab results with the patient, details below.  He is compliant with his medical therapy.  Blood pressure is low normal today.  Reviewed recent lab results with the patient, details were.    Vitals:   08/01/24 1312  BP: 102/60  Pulse: 80  SpO2: 96%       ROS:  Review of Systems  Cardiovascular:  Negative for chest pain, dyspnea on exertion, leg swelling, palpitations and syncope.     Studies Reviewed: SABRA        EKG 02/25/2024: Normal sinus rhythm Inferior infarct , probably old Anteroseptal infarct possible, probably old When compared with ECG of 08/22/2023, No significant change was found     Labs 04/2024: Chol 134, TG 193, HDL 41, LDL 61 Hb 13.5 Cr 8.87, K 4.7 proBNP 1007  03/2024: Chol 134, TG 193, HDL 41, LDL 61 Cr 1.11 BNP 939 (1226 in 07/2022)  Echocardiogram 02/2024:  1. Left ventricular ejection fraction, by estimation, is 35 to 40%. Left  ventricular ejection fraction by 2D MOD biplane is 36.6 %. The left  ventricle has moderately  decreased function. The left ventricle  demonstrates regional wall motion abnormalities  (see scoring diagram/findings for description). The left ventricular  internal cavity size was moderately dilated. Left ventricular diastolic  parameters are consistent with Grade I diastolic dysfunction (impaired  relaxation). There is severe akinesis of  the left ventricular, entire inferior wall, lateral wall and inferolateral  wall.   2. Right ventricular systolic function is normal. The right ventricular  size is normal. Tricuspid regurgitation signal is inadequate for assessing  PA pressure.   3. The mitral valve is grossly normal. Mild mitral valve regurgitation.   4. The aortic valve is tricuspid. Aortic valve regurgitation is not  visualized. Aortic valve sclerosis is present, with no evidence of aortic  valve stenosis.   5. The inferior vena cava is normal in size with greater than 50%  respiratory variability, suggesting right atrial pressure of 3 mmHg.   Comparison(s): Changes from prior study are noted. 06/25/2022: LVEF 40-45%.    Physical Exam:   Physical Exam Vitals and nursing note reviewed.  Constitutional:      General: He is not in acute distress. Neck:     Vascular: No JVD.  Cardiovascular:     Rate and Rhythm: Normal rate and regular rhythm.     Heart sounds: Normal heart sounds. No murmur heard. Pulmonary:     Effort: Pulmonary effort is normal.     Breath sounds: Normal breath sounds. No wheezing or rales.  Musculoskeletal:  Right lower leg: No edema.     Comments: Lt AKA      VISIT DIAGNOSES:   ICD-10-CM   1. HFrEF (heart failure with reduced ejection fraction) (HCC)  I50.20     2. Coronary artery disease involving native coronary artery of native heart without angina pectoris  I25.10     3. PAD (peripheral artery disease) (HCC)  I73.9          ASSESSMENT AND PLAN: .    Mike [name] is a 63 y.o. male with CAD, culprit (RCA) and nonculprit  (LCx) PCI after STEMI 07/2020, HFrEF, moderate MR, h/o cardiac arrest 2/2 Torsades post PCI during index hospitalization, PAD with critical limb ischemia s/p Rt iliac revascularization, and s/p Lt AKA amputation for critical limb ischemia,  severe bilateral renal artery stenoses w/h/o CIN (09/2020), stroke s/p tPA with resolution of neurodeficit (11/2020), multifocal stroke on MRI suspicious for cardioembolic source, small PFO/secundum ASD and large pulmonary AV fistula now s/p closure by Dr. Luverne (02/2021), ILR placement (04/2021), asymptomatic sinus pauses  HFrEF: LVEF 35-40% (03/2024), no significant MR. Likely related to akinesis from prior MI. Clinically, appears fairly compensated. On relatively low-dose GDMT for HFrEF given renal dysfunction and known bilateral renal artery stenosis, as well as asymptomatic sinus pauses. Currently on Entresto  49-51 mg bid, metoprolol  succinate 25 mg daily, Farxiga  10 mg daily, spironolactone  12.5 mg daily.  proBNP has stayed relatively flat.  Patient is clinically euvolemic.  Continue current GDMT.   Asymptomatic sinus pauses: Likely due to OSA. He is not interested in sleep testing. Alerts are now turned off.  Stroke: Multifocal stroke with small PFO/secundum ASD but large pulmonary AV fistula (11/2020), now s/p closure by Dr. Luverne (02/2021) Repeat TCD bubble study as pr Dr. Bucky recommendations S/p ILR placement (04/2021). No Afib since implantation. Alerts are now turned off.   PAD: Successful revascularization of right common iliac and right external iliac artery (09/2020) S/p Lt AKA.  No critical limb ischemia, although he has pre-ulcerative calloses on right sole.  Continue Aspirin  81 mg daily and Xarelto  2.5 mg bid.    CAD: S/p STEMI 07/2020. Currently no angina symptoms Continue aspirin  81 mg daily and Xarelto  2.5 mg bid. Continue Crestor  40 mg, Zetia  10 mg daily. Lipids well-controlled.    F/u in 6 months  Signed, Newman JINNY Lawrence, MD

## 2024-08-01 NOTE — Patient Instructions (Signed)

## 2024-08-07 ENCOUNTER — Ambulatory Visit: Payer: Self-pay | Admitting: Cardiology

## 2024-08-29 ENCOUNTER — Ambulatory Visit (INDEPENDENT_AMBULATORY_CARE_PROVIDER_SITE_OTHER): Payer: Self-pay

## 2024-08-29 DIAGNOSIS — I5020 Unspecified systolic (congestive) heart failure: Secondary | ICD-10-CM

## 2024-08-31 LAB — CUP PACEART REMOTE DEVICE CHECK
Date Time Interrogation Session: 20250901143130
Implantable Pulse Generator Implant Date: 20220513
Pulse Gen Model: 436066
Pulse Gen Serial Number: 94062842

## 2024-09-05 ENCOUNTER — Telehealth: Payer: Self-pay | Admitting: Cardiology

## 2024-09-05 ENCOUNTER — Other Ambulatory Visit: Payer: Self-pay

## 2024-09-05 DIAGNOSIS — I739 Peripheral vascular disease, unspecified: Secondary | ICD-10-CM

## 2024-09-05 DIAGNOSIS — I2510 Atherosclerotic heart disease of native coronary artery without angina pectoris: Secondary | ICD-10-CM

## 2024-09-05 MED ORDER — RIVAROXABAN 2.5 MG PO TABS: 2.5000 mg | ORAL_TABLET | Freq: Two times a day (BID) | ORAL | 3 refills | Status: DC

## 2024-09-05 NOTE — Telephone Encounter (Signed)
*  STAT* If patient is at the pharmacy, call can be transferred to refill team.   1. Which medications need to be refilled? (please list name of each medication and dose if known) DULoxetine  (CYMBALTA ) 30 MG capsule   rivaroxaban  (XARELTO ) 2.5 MG TABS tablet   2. Which pharmacy/location (including street and city if local pharmacy) is medication to be sent to? [id number] [address], [place] - [address]   3. Do they need a 30 day or 90 day supply? 90

## 2024-09-06 NOTE — Progress Notes (Signed)
 Remote Loop Recorder Transmission

## 2024-09-27 NOTE — Progress Notes (Signed)
 Remote Loop Recorder Transmission

## 2024-09-28 NOTE — Progress Notes (Signed)
 Remote Loop Recorder Transmission

## 2024-09-29 ENCOUNTER — Ambulatory Visit: Payer: Self-pay

## 2024-09-29 DIAGNOSIS — I639 Cerebral infarction, unspecified: Secondary | ICD-10-CM | POA: Diagnosis not present

## 2024-09-29 LAB — CUP PACEART REMOTE DEVICE CHECK
Date Time Interrogation Session: 20251002100426
Implantable Pulse Generator Implant Date: 20220513
Pulse Gen Model: 436066
Pulse Gen Serial Number: 94062842

## 2024-09-30 NOTE — Progress Notes (Signed)
 Remote Loop Recorder Transmission

## 2024-10-14 ENCOUNTER — Other Ambulatory Visit: Payer: Self-pay | Admitting: Interventional Radiology

## 2024-10-14 DIAGNOSIS — Q2572 Congenital pulmonary arteriovenous malformation: Secondary | ICD-10-CM

## 2024-10-22 ENCOUNTER — Ambulatory Visit: Payer: Self-pay | Admitting: Cardiology

## 2024-10-31 ENCOUNTER — Ambulatory Visit (INDEPENDENT_AMBULATORY_CARE_PROVIDER_SITE_OTHER): Payer: Self-pay

## 2024-10-31 DIAGNOSIS — I639 Cerebral infarction, unspecified: Secondary | ICD-10-CM | POA: Diagnosis not present

## 2024-10-31 LAB — CUP PACEART REMOTE DEVICE CHECK
Date Time Interrogation Session: 20251103081305
Implantable Pulse Generator Implant Date: 20220513
Pulse Gen Model: 436066
Pulse Gen Serial Number: 94062842

## 2024-11-04 NOTE — Progress Notes (Signed)
 Remote Loop Recorder Transmission

## 2024-11-07 ENCOUNTER — Ambulatory Visit: Payer: Self-pay | Admitting: Cardiology

## 2024-11-16 ENCOUNTER — Encounter: Payer: Self-pay | Admitting: Family

## 2024-11-16 ENCOUNTER — Ambulatory Visit: Admitting: Family

## 2024-11-16 DIAGNOSIS — Z89612 Acquired absence of left leg above knee: Secondary | ICD-10-CM

## 2024-11-16 NOTE — Progress Notes (Signed)
 Office Visit Note   Patient: Mike [name]           Date of Birth: 1961/01/06           MRN: [id number] Visit Date: 11/16/2024              Requested by: Rolinda Millman, MD [phone] Mike [name] [address] [place],  [place] [address] PCP: Rolinda Millman, MD  Chief Complaint  Patient presents with   Left Leg - Follow-up    S/p Left AKA      HPI: The patient is a 63 year old gentleman who presents today for evaluation of left residual limb he currently has a prosthesis with a microprocessor unfortunately microprocessor has given out he fears falling has had difficulty with weightbearing due to failure of his prosthesis specifically the knee.  Patient is an existing left transfemoral amputee.  Patient's current comorbidities are not expected to impact the ability to function with the prescribed prosthesis. Patient verbally communicates a strong desire to use a prosthesis. Patient currently requires mobility aids to ambulate without a prosthesis.  Expects not to use mobility aids with a new prosthesis.  Patient is a K3 level ambulator that spends a lot of time walking around on uneven terrain over obstacles, up and down stairs, and ambulates with a variable cadence.  The patient will benefit from the continued use of MPK technology.   Assessment & Plan: Visit Diagnoses: No diagnosis found.  Plan: Given an order for microprocessor replacement.  Include all necessary supplies for his prosthesis.  Follow-Up Instructions: Return if symptoms worsen or fail to improve.   Ortho Exam  Patient is alert, oriented, no adenopathy, well-dressed, normal affect, normal respiratory effort. On examination left residual limb there is no erythema no ulceration the incision is well-healed well consolidated    Imaging: No results found. No images are attached to the encounter.  Labs: Lab Results  Component Value Date   HGBA1C 4.7 (L) 12/12/2020   HGBA1C 7.1 (H) 10/31/2020   HGBA1C 5.8  (H) 08/02/2020   LABURIC 9.1 (H) 10/17/2020   REPTSTATUS 11/12/2020 FINAL 11/11/2020   CULT  11/11/2020    NO GROWTH Performed at [facility], [address] [place], [place] [address]      Lab Results  Component Value Date   ALBUMIN  3.0 (L) 12/11/2020   ALBUMIN  1.8 (L) 11/15/2020   ALBUMIN  1.8 (L) 11/14/2020    Lab Results  Component Value Date   MG 1.7 11/11/2020   MG 1.8 11/10/2020   MG 2.4 10/31/2020   No results found for: VD25OH  No results found for: PREALBUMIN    Latest Ref Rng & Units 03/20/2021    6:49 AM 12/14/2020    4:50 AM 12/12/2020    1:30 AM  CBC EXTENDED  WBC 4.0 - 10.5 K/uL 8.3  7.5  8.2   RBC 4.22 - 5.81 MIL/uL 5.01  3.92  3.89   Hemoglobin 13.0 - 17.0 g/dL 86.4  88.3  88.9   HCT 39.0 - 52.0 % 42.1  35.5  36.3   Platelets 150 - 400 K/uL 212  202  214      There is no height or weight on file to calculate BMI.  Orders:  No orders of the defined types were placed in this encounter.  No orders of the defined types were placed in this encounter.    Procedures: No procedures performed  Clinical Data: No additional findings.  ROS:  All other  systems negative, except as noted in the HPI. Review of Systems  Objective: Vital Signs: There were no vitals taken for this visit.  Specialty Comments:  No specialty comments available.  PMFS History: Patient Active Problem List   Diagnosis Date Noted   Encounter for loop recorder check 07/26/2021   Cryptogenic stroke (HCC) 05/10/2021   Loop Biotronic [id number] 05/10/2021 05/10/2021   AVM (arteriovenous malformation) 03/20/2021   Pulmonary AV (arteriovenous) fistula (HCC) 02/20/2021   Pre-ulcerative calluses 12/20/2020   Cerebral thrombosis with cerebral infarction 12/11/2020   Cerebrovascular accident (CVA) due to embolism of cerebral artery (HCC) 12/11/2020   Pressure injury of skin 11/12/2020   Acute urinary retention 11/09/2020   Normal anion gap metabolic acidosis  11/07/2020   Hypotension 11/07/2020   Chest pain 10/31/2020   Acute on chronic HFrEF (heart failure with reduced ejection fraction) (HCC)    Goals of care, counseling/discussion    Palliative care by specialist    DNR (do not resuscitate)    Critical limb ischemia with history of revascularization of same extremity (HCC) 10/30/2020   Hyperglycemia 10/30/2020   Tobacco abuse 10/30/2020   Leukocytosis 10/30/2020   Chronic combined systolic and diastolic CHF (congestive heart failure) (HCC) 10/30/2020   Ischemic cardiomyopathy 10/24/2020   AKI (acute kidney injury) 10/17/2020   PAD (peripheral artery disease) 10/16/2020   Critical lower limb ischemia (HCC) 10/05/2020   Rest pain of left upper extremity due to atherosclerosis (HCC) 10/05/2020   Severe claudication 09/20/2020   Paresthesia of both lower extremities 09/20/2020   H/O cardiac arrest 09/20/2020   H/O acute myocardial infarction 08/14/2020   Snoring 08/14/2020   Sinus pause 08/14/2020   Mixed hyperlipidemia 08/14/2020   Tobacco dependence 08/14/2020   Coronary artery disease involving native coronary artery of native heart without angina pectoris 08/10/2020   HFrEF (heart failure with reduced ejection fraction) (HCC) 08/10/2020   Nonrheumatic mitral valve regurgitation 08/10/2020   STEMI (ST elevation myocardial infarction) (HCC) 08/02/2020   Accelerated hypertension 08/01/2020   Past Medical History:  Diagnosis Date   Anxiety    CAD (coronary artery disease)    CHF (congestive heart failure) (HCC)    Cryptogenic stroke (HCC) 05/10/2021   Encounter for loop recorder check 07/26/2021   Heart attack (HCC) 08/01/2020   Heart failure with reduced ejection fraction (HCC)    Hyperlipidemia    Hypertension    Implantable loop recorder present    Loop [employer] [id number] 05/10/2021 05/10/2021   PAD (peripheral artery disease)    right CIA & right distal iliac artery stents 10/16/20; left AKA 11/06/20   PFO (patent foramen  ovale) 12/14/2020   Pneumonia    Pre-diabetes    Pulmonary AV (arteriovenous) fistula (HCC) 12/14/2020   Stroke (HCC) 12/11/2020   no residuial effects    Family History  Problem Relation Age of Onset   Heart disease Mother    Heart disease Father    Cancer Brother     Past Surgical History:  Procedure Laterality Date   ABDOMINAL AORTOGRAM W/LOWER EXTREMITY N/A 10/16/2020   Procedure: ABDOMINAL AORTOGRAM W/LOWER EXTREMITY;  Surgeon: Elmira Newman PARAS, MD;  Location: [facility];  Service: Cardiovascular;  Laterality: N/A;   AMPUTATION Left 11/06/2020   Procedure: AMPUTATION ABOVE KNEE;  Surgeon: Oris Krystal FALCON, MD;  Location: [facility];  Service: Vascular;  Laterality: Left;   BIOPSY  09/16/2022   Procedure: BIOPSY;  Surgeon: Elicia Claw, MD;  Location: [facility];  Service: Gastroenterology;;   BUBBLE STUDY  12/14/2020   Procedure: BUBBLE STUDY;  Surgeon: Elmira Newman PARAS, MD;  Location: [facility];  Service: Cardiovascular;;   CARDIAC CATHETERIZATION     COLONOSCOPY     COLONOSCOPY  09/16/2022   COLONOSCOPY WITH PROPOFOL  N/A 09/16/2022   Procedure: COLONOSCOPY WITH PROPOFOL ;  Surgeon: Elicia Claw, MD;  Location: [facility];  Service: Gastroenterology;  Laterality: N/A;   CORONARY STENT INTERVENTION N/A 08/03/2020   Procedure: CORONARY STENT INTERVENTION;  Surgeon: Elmira Newman PARAS, MD;  Location: [facility];  Service: Cardiovascular;  Laterality: N/A;   CORONARY/GRAFT ACUTE MI REVASCULARIZATION N/A 08/01/2020   Procedure: Coronary/Graft Acute MI Revascularization;  Surgeon: Elmira Newman PARAS, MD;  Location: [facility];  Service: Cardiovascular;  Laterality: N/A;   IR ANGIOGRAM PULMONARY LEFT SELECTIVE  03/20/2021   IR ANGIOGRAM SELECTIVE EACH ADDITIONAL VESSEL  03/20/2021   IR EMBO ARTERIAL NOT HEMORR HEMANG INC GUIDE ROADMAPPING  03/20/2021   IR RADIOLOGIST EVAL & MGMT  01/30/2021   IR RADIOLOGIST EVAL & MGMT  04/18/2021   IR  RADIOLOGIST EVAL & MGMT  07/25/2021   IR RADIOLOGIST EVAL & MGMT  09/02/2023   IR US  GUIDE VASC ACCESS RIGHT  03/20/2021   LEFT HEART CATH AND CORONARY ANGIOGRAPHY N/A 08/01/2020   Procedure: LEFT HEART CATH AND CORONARY ANGIOGRAPHY;  Surgeon: Elmira Newman PARAS, MD;  Location: [facility];  Service: Cardiovascular;  Laterality: N/A;   LEFT HEART CATH AND CORONARY ANGIOGRAPHY N/A 08/03/2020   Procedure: LEFT HEART CATH AND CORONARY ANGIOGRAPHY;  Surgeon: Elmira Newman PARAS, MD;  Location: [facility];  Service: Cardiovascular;  Laterality: N/A;   PERIPHERAL INTRAVASCULAR LITHOTRIPSY Right 10/16/2020   Procedure: INTRAVASCULAR LITHOTRIPSY;  Surgeon: Elmira Newman PARAS, MD;  Location: [facility];  Service: Cardiovascular;  Laterality: Right;  Common and External Iliac   PERIPHERAL VASCULAR INTERVENTION Right 10/16/2020   Procedure: PERIPHERAL VASCULAR INTERVENTION;  Surgeon: Elmira Newman PARAS, MD;  Location: [facility];  Service: Cardiovascular;  Laterality: Right;  Common and Iliac Stent   POLYPECTOMY  09/16/2022   Procedure: POLYPECTOMY;  Surgeon: Elicia Claw, MD;  Location: [facility];  Service: Gastroenterology;;   RADIOLOGY WITH ANESTHESIA N/A 03/20/2021   Procedure: PULMONARY EMBOLIZATION;  Surgeon: Luverne Aran, MD;  Location: [facility] OR;  Service: Radiology;  Laterality: N/A;   TEE WITHOUT CARDIOVERSION N/A 12/14/2020   Procedure: TRANSESOPHAGEAL ECHOCARDIOGRAM (TEE);  Surgeon: Elmira Newman PARAS, MD;  Location: [facility];  Service: Cardiovascular;  Laterality: N/A;   Social History   Occupational History   Occupation: Disability  Tobacco Use   Smoking status: Every Day    Current packs/day: 0.50    Average packs/day: 0.5 packs/day for 40.0 years (20.0 ttl pk-yrs)    Types: Cigarettes   Smokeless tobacco: Never   Tobacco comments:    3 - 7 a day  Vaping Use   Vaping status: Never Used  Substance and Sexual Activity   Alcohol use:  Yes    Comment: ocassional since MI   2-3 x a week   Drug use: Never   Sexual activity: Not Currently

## 2024-12-01 ENCOUNTER — Ambulatory Visit: Attending: Cardiology

## 2024-12-01 DIAGNOSIS — I639 Cerebral infarction, unspecified: Secondary | ICD-10-CM

## 2024-12-03 LAB — CUP PACEART REMOTE DEVICE CHECK
Date Time Interrogation Session: 20251204072421
Implantable Pulse Generator Implant Date: 20220513
Pulse Gen Model: 436066
Pulse Gen Serial Number: 94062842

## 2024-12-06 NOTE — Progress Notes (Signed)
 Remote Loop Recorder Transmission

## 2024-12-07 ENCOUNTER — Telehealth: Payer: Self-pay | Admitting: Cardiology

## 2024-12-07 DIAGNOSIS — I739 Peripheral vascular disease, unspecified: Secondary | ICD-10-CM

## 2024-12-07 DIAGNOSIS — I2510 Atherosclerotic heart disease of native coronary artery without angina pectoris: Secondary | ICD-10-CM

## 2024-12-07 MED ORDER — RIVAROXABAN 2.5 MG PO TABS: 2.5000 mg | ORAL_TABLET | Freq: Two times a day (BID) | ORAL | 1 refills | Status: AC

## 2024-12-07 NOTE — Telephone Encounter (Signed)
 Refills has been sent to the pharmacy.

## 2024-12-07 NOTE — Telephone Encounter (Signed)
°*  STAT* If patient is at the pharmacy, call can be transferred to refill team.   1. Which medications need to be refilled? (please list name of each medication and dose if known) rivaroxaban  (XARELTO ) 2.5 MG TABS tablet    2. Would you like to learn more about the convenience, safety, & potential cost savings by using the [facility]? no   3. Are you open to using the [facility] (Type [facility]. ). No    4. Which pharmacy/location (including street and city if local pharmacy) is medication to be sent to?[id number], [place] - [address]    5. Do they need a 30 day or 90 day supply? 30 day   Pt is out of medication

## 2024-12-12 ENCOUNTER — Inpatient Hospital Stay
Admission: RE | Admit: 2024-12-12 | Discharge: 2024-12-12 | Disposition: A | Source: Ambulatory Visit | Attending: Interventional Radiology | Admitting: Interventional Radiology

## 2024-12-12 DIAGNOSIS — Q2572 Congenital pulmonary arteriovenous malformation: Secondary | ICD-10-CM

## 2024-12-12 MED ORDER — IOPAMIDOL (ISOVUE-370) INJECTION 76%
75.0000 mL | Freq: Once | INTRAVENOUS | Status: AC | PRN
  Administered 2024-12-12: 12:00:00 75 mL via INTRAVENOUS

## 2024-12-16 ENCOUNTER — Inpatient Hospital Stay
Admission: RE | Admit: 2024-12-16 | Discharge: 2024-12-16 | Attending: Interventional Radiology | Admitting: Interventional Radiology

## 2024-12-16 DIAGNOSIS — Q2572 Congenital pulmonary arteriovenous malformation: Secondary | ICD-10-CM

## 2024-12-16 HISTORY — PX: IR RADIOLOGIST EVAL & MGMT: IMG5224

## 2024-12-16 NOTE — Progress Notes (Signed)
 "      Chief Complaint: Patient was seen in consultation today for follow up after occlusion of a pulmonary AV malformation.   History of Present Illness: Mike [name] is a 63 y.o. male status post pulmonary arteriography with transcatheter occlusion of a left lower lobe pulmonary AV malformation/fistula on 03/20/2021.  He continues to feel well and denies any chest or neurologic symptoms. A follow up CTA of the chest was performed on 12/12/24.  Past Medical History:  Diagnosis Date   Anxiety    CAD (coronary artery disease)    CHF (congestive heart failure) (HCC)    Cryptogenic stroke (HCC) 05/10/2021   Encounter for loop recorder check 07/26/2021   Heart attack (HCC) 08/01/2020   Heart failure with reduced ejection fraction (HCC)    Hyperlipidemia    Hypertension    Implantable loop recorder present    [employer] [id number] 05/10/2021 05/10/2021   PAD (peripheral artery disease)    right CIA & right distal iliac artery stents 10/16/20; left AKA 11/06/20   PFO (patent foramen ovale) 12/14/2020   Pneumonia    Pre-diabetes    Pulmonary AV (arteriovenous) fistula (HCC) 12/14/2020   Stroke (HCC) 12/11/2020   no residuial effects    Past Surgical History:  Procedure Laterality Date   ABDOMINAL AORTOGRAM W/LOWER EXTREMITY N/A 10/16/2020   Procedure: ABDOMINAL AORTOGRAM W/LOWER EXTREMITY;  Surgeon: Elmira Newman PARAS, MD;  Location: [facility];  Service: Cardiovascular;  Laterality: N/A;   AMPUTATION Left 11/06/2020   Procedure: AMPUTATION ABOVE KNEE;  Surgeon: Oris Krystal FALCON, MD;  Location: [facility] OR;  Service: Vascular;  Laterality: Left;   BIOPSY  09/16/2022   Procedure: BIOPSY;  Surgeon: Elicia Claw, MD;  Location: [facility];  Service: Gastroenterology;;   BUBBLE STUDY  12/14/2020   Procedure: BUBBLE STUDY;  Surgeon: Elmira Newman PARAS, MD;  Location: [facility];  Service: Cardiovascular;;   CARDIAC CATHETERIZATION     COLONOSCOPY     COLONOSCOPY   09/16/2022   COLONOSCOPY WITH PROPOFOL  N/A 09/16/2022   Procedure: COLONOSCOPY WITH PROPOFOL ;  Surgeon: Elicia Claw, MD;  Location: [facility];  Service: Gastroenterology;  Laterality: N/A;   CORONARY STENT INTERVENTION N/A 08/03/2020   Procedure: CORONARY STENT INTERVENTION;  Surgeon: Elmira Newman PARAS, MD;  Location: [facility];  Service: Cardiovascular;  Laterality: N/A;   CORONARY/GRAFT ACUTE MI REVASCULARIZATION N/A 08/01/2020   Procedure: Coronary/Graft Acute MI Revascularization;  Surgeon: Elmira Newman PARAS, MD;  Location: [facility];  Service: Cardiovascular;  Laterality: N/A;   IR ANGIOGRAM PULMONARY LEFT SELECTIVE  03/20/2021   IR ANGIOGRAM SELECTIVE EACH ADDITIONAL VESSEL  03/20/2021   IR EMBO ARTERIAL NOT HEMORR HEMANG INC GUIDE ROADMAPPING  03/20/2021   IR RADIOLOGIST EVAL & MGMT  01/30/2021   IR RADIOLOGIST EVAL & MGMT  04/18/2021   IR RADIOLOGIST EVAL & MGMT  07/25/2021   IR RADIOLOGIST EVAL & MGMT  09/02/2023   IR US  GUIDE VASC ACCESS RIGHT  03/20/2021   LEFT HEART CATH AND CORONARY ANGIOGRAPHY N/A 08/01/2020   Procedure: LEFT HEART CATH AND CORONARY ANGIOGRAPHY;  Surgeon: Elmira Newman PARAS, MD;  Location: [facility];  Service: Cardiovascular;  Laterality: N/A;   LEFT HEART CATH AND CORONARY ANGIOGRAPHY N/A 08/03/2020   Procedure: LEFT HEART CATH AND CORONARY ANGIOGRAPHY;  Surgeon: Elmira Newman PARAS, MD;  Location: [facility];  Service: Cardiovascular;  Laterality: N/A;   PERIPHERAL INTRAVASCULAR LITHOTRIPSY Right 10/16/2020   Procedure: INTRAVASCULAR LITHOTRIPSY;  Surgeon: Elmira Newman PARAS, MD;  Location: [facility];  Service: Cardiovascular;  Laterality: Right;  Common and External Iliac   PERIPHERAL VASCULAR INTERVENTION Right 10/16/2020   Procedure: PERIPHERAL VASCULAR INTERVENTION;  Surgeon: Elmira Newman PARAS, MD;  Location: [facility];  Service: Cardiovascular;  Laterality: Right;  Common and Iliac  Stent   POLYPECTOMY  09/16/2022   Procedure: POLYPECTOMY;  Surgeon: Elicia Claw, MD;  Location: [facility];  Service: Gastroenterology;;   RADIOLOGY WITH ANESTHESIA N/A 03/20/2021   Procedure: PULMONARY EMBOLIZATION;  Surgeon: Luverne Aran, MD;  Location: [facility] OR;  Service: Radiology;  Laterality: N/A;   TEE WITHOUT CARDIOVERSION N/A 12/14/2020   Procedure: TRANSESOPHAGEAL ECHOCARDIOGRAM (TEE);  Surgeon: Elmira Newman PARAS, MD;  Location: [facility];  Service: Cardiovascular;  Laterality: N/A;    Allergies: Patient has no known allergies.  Medications: Prior to Admission medications  Medication Sig Start Date End Date Taking? Authorizing Provider  acetaminophen  (TYLENOL ) 500 MG tablet Take 500-1,000 mg by mouth every 6 (six) hours as needed (pain).    [provider]  aspirin  EC 81 MG tablet Take 1 tablet (81 mg total) by mouth daily. Swallow whole. 08/26/23   Patwardhan, Newman PARAS, MD  dapagliflozin  propanediol (FARXIGA ) 10 MG TABS tablet Take 1 tablet (10 mg total) by mouth daily before breakfast. 06/01/24 06/01/25  Patwardhan, Newman PARAS, MD  DULoxetine  (CYMBALTA ) 30 MG capsule TAKE 1 CAPSULE BY MOUTH EVERY DAY 11/14/22   Harden Jerona GAILS, MD  ezetimibe  (ZETIA ) 10 MG tablet TAKE 1 TABLET BY MOUTH EVERY DAY 03/18/24   Patwardhan, Manish J, MD  furosemide  (LASIX ) 20 MG tablet TAKE 1 TABLET BY MOUTH EVERY DAY 07/22/24   Patwardhan, Manish J, MD  gabapentin  (NEURONTIN ) 300 MG capsule Take 1 capsule (300 mg total) by mouth 2 (two) times daily. Patient taking differently: Take 300 mg by mouth daily. 11/15/20   Lue Elsie BROCKS, MD  metoprolol  succinate (TOPROL -XL) 25 MG 24 hr tablet TAKE 1 TABLET BY MOUTH EVERY DAY AFTER MEAL 03/18/24   Patwardhan, Newman PARAS, MD  Multiple Vitamins-Minerals (MULTI FOR HIM 50+) TABS Take 1 tablet by mouth See admin instructions.    [provider]  nitroGLYCERIN  (NITROSTAT ) 0.4 MG SL tablet Place 1 tablet (0.4 mg total) under the tongue every  5 (five) minutes as needed for chest pain. 08/26/23   Patwardhan, Newman PARAS, MD  rivaroxaban  (XARELTO ) 2.5 MG TABS tablet Take 1 tablet (2.5 mg total) by mouth 2 (two) times daily. 12/07/24   Patwardhan, Newman PARAS, MD  rosuvastatin  (CRESTOR ) 40 MG tablet Take 1 tablet (40 mg total) by mouth daily. 03/14/24   Patwardhan, Newman PARAS, MD  sacubitril -valsartan  (ENTRESTO ) 49-51 MG Take 1 tablet by mouth 2 (two) times daily. 07/05/24   Patwardhan, Newman PARAS, MD  spironolactone  (ALDACTONE ) 25 MG tablet Take 1/2 tablets (12.5 mg total) by mouth daily. 07/20/24   Patwardhan, Newman PARAS, MD     Family History  Problem Relation Age of Onset   Heart disease Mother    Heart disease Father    Cancer Brother     Social History   Socioeconomic History   Marital status: Single    Spouse name: Not on file   Number of children: 1   Years of education: BA   Highest education level: Not on file  Occupational History   Occupation: Disability  Tobacco Use   Smoking status: Every Day    Current packs/day: 0.50    Average packs/day: 0.5 packs/day  for 40.0 years (20.0 ttl pk-yrs)    Types: Cigarettes   Smokeless tobacco: Never   Tobacco comments:    3 - 7 a day  Vaping Use   Vaping status: Never Used  Substance and Sexual Activity   Alcohol use: Yes    Comment: ocassional since MI   2-3 x a week   Drug use: Never   Sexual activity: Not Currently  Other Topics Concern   Not on file  Social History Narrative   Left handed   Caffeine use: 3-4 sodas per day, tea very rare.    Social Drivers of Health   Tobacco Use: High Risk (11/16/2024)   Patient History    Smoking Tobacco Use: Every Day    Smokeless Tobacco Use: Never    Passive Exposure: Not on file  Financial Resource Strain: Not on file  Food Insecurity: Not on file  Transportation Needs: Not on file  Physical Activity: Not on file  Stress: Not on file  Social Connections: Not on file  Depression (EYV7-0): Not on file  Alcohol Screen: Not on  file  Housing: Not on file  Utilities: Not on file  Health Literacy: Not on file      Review of Systems: A 12 point ROS discussed and pertinent positives are indicated in the HPI above.  All other systems are negative.  Review of Systems  Constitutional: Negative.   Respiratory: Negative.    Cardiovascular: Negative.   Gastrointestinal: Negative.   Genitourinary: Negative.   Musculoskeletal: Negative.   Neurological: Negative.     Vital Signs: BP 130/81 (BP Location: Left Arm, Patient Position: Sitting, Cuff Size: Normal)   Pulse 94   Temp 97.8 F (36.6 C) (Oral)   Resp 18   SpO2 93%     Physical Exam Vitals reviewed.  Constitutional:      General: He is not in acute distress.    Appearance: Normal appearance. He is not ill-appearing, toxic-appearing or diaphoretic.  Neurological:     General: No focal deficit present.     Mental Status: He is alert and oriented to person, place, and time.      Imaging: CT Angio Chest Pulmonary Embolism (PE) W or WO Contrast Result Date: 12/12/2024 EXAM: CTA of the Chest with contrast for PE 12/12/2024 12:21:56 PM TECHNIQUE: CTA of the chest was performed without and with the administration of 75 mL of iopamidol  (ISOVUE -370) 76% injection. Multiplanar reformatted images are provided for review. MIP images are provided for review. Automated exposure control, iterative reconstruction, and/or weight based adjustment of the mA/kV was utilized to reduce the radiation dose to as low as reasonably achievable. COMPARISON: Prior study dated 09/01/2023. CLINICAL HISTORY: Previous embolization of a pulmonary arteriovenous malformation. FINDINGS: PULMONARY ARTERIES: Pulmonary arteries are adequately opacified for evaluation. Good opacification of the central and segmental pulmonary arteries. No focal filling defects. No evidence of significant pulmonary embolus. Main pulmonary artery is normal in caliber. MEDIASTINUM: Normal heart size. No  pericardial effusions. Normal caliber thoracic aorta. Calcification of the aorta and coronary arteries. Great vessel origins are patent. Esophagus and the thyroid gland are decompressed and unremarkable. LYMPH NODES: No significant lymphadenopathy is noted. LUNGS AND PLEURA: Emphysematous changes in the lungs. Mild bronchiectasis with bronchial wall thickening likely indicating chronic bronchitis. Increased density in the medial anterior left lung base likely corresponds to an area of embolization. Small surgical clips or coils are present. There is a nodular opacity in this area measuring 5 mm diameter. No significant change.  Several additional smaller nodules are present, measuring less than 4 mm in diameter. No change. No follow-up is indicated. No pleural effusion or pneumothorax. UPPER ABDOMEN: Subcentimeter lesion in the right adrenal gland containing macroscopic fat likely representing a benign lesion myelolipoma. No imaging follow-up is indicated. No change. 1.7 cm diameter lesion in the upper pole of the right kidney containing macroscopic fat, unchanged. This could represent an angiomyolipoma or possibly an area of prior ablation. SOFT TISSUES AND BONES: No acute bone or soft tissue abnormality. IMPRESSION: 1. No evidence of pulmonary embolism. 2. Stable post-embolization changes in the medial anterior left lung base with associated 5 mm nodular opacity and several smaller nodules, unchanged. 3. Pulmonary emphysema. Pulmonary emphysema is an independent risk factor for lung cancer. Recommend consideration for evaluation for a low-dose CT lung cancer screening program. 4. Mild bronchiectasis with bronchial wall thickening, likely reflecting chronic bronchitis. Electronically signed by: Elsie Gravely MD 12/12/2024 01:01 PM EST RP Workstation: [id number]   CUP PACEART REMOTE DEVICE CHECK Result Date: 12/03/2024 ILR summary report received. Battery status OK. Normal device function. No new symptom, tachy,  or pause episodes. No new AF episodes. 3 brady detections, longest 14 seconds, known history, asymptomatic. Monthly summary reports and ROV/PRN   Labs:  CBC: No results for input(s): WBC, HGB, HCT, PLT in the last 8760 hours.  COAGS: No results for input(s): INR, APTT in the last 8760 hours.  BMP: Recent Labs    04/07/24 0940 05/06/24 1546  NA 142 140  K 4.6 4.7  CL 105 105  CO2 18* 17*  GLUCOSE 110* 111*  BUN 21 21  CALCIUM  9.4 9.2  CREATININE 1.11 1.12    Assessment and Plan:  I met with Mike [name] and reviewed the recent follow up chest CTA. There is stable visualization of some small, atretic tortuous residual venous flow just beyond the arterial occluder device in the left lower lobe. This appears stable since the CTA in 2024. I do not visualize a separate feeding arterial branch supplying abnormal veins. Given stability and change in appearance compared to the original AV fistula which was substantially larger, I told Mike [name] that we can continue to follow this. Additional occlusion of the supplying artery in the LLL would only be necessary should the flow become visibly more prominent by follow up CTA, or should he develop any further neurologic symptoms suggestive of another paradoxical event. I recommended another follow up CTA in one year.  Electronically Signed: Marcey ONEIDA Moan 12/16/2024, 5:01 PM    I spent a total of 10 Minutes in face to face in clinical consultation, greater than 50% of which was counseling/coordinating care post embolization of a pulmonary AV malformation.  "

## 2024-12-25 ENCOUNTER — Ambulatory Visit: Payer: Self-pay | Admitting: Cardiology

## 2025-01-01 ENCOUNTER — Ambulatory Visit: Attending: Cardiology

## 2025-01-01 DIAGNOSIS — I455 Other specified heart block: Secondary | ICD-10-CM

## 2025-01-02 LAB — CUP PACEART REMOTE DEVICE CHECK
Date Time Interrogation Session: 20260105092257
Implantable Pulse Generator Implant Date: 20220513
Pulse Gen Model: 436066
Pulse Gen Serial Number: 94062842

## 2025-01-05 NOTE — Progress Notes (Signed)
 Remote Loop Recorder Transmission

## 2025-01-16 ENCOUNTER — Ambulatory Visit: Payer: Self-pay | Admitting: Cardiology

## 2025-02-01 ENCOUNTER — Ambulatory Visit

## 2025-02-02 LAB — CUP PACEART REMOTE DEVICE CHECK
Date Time Interrogation Session: 20260204094550
Implantable Pulse Generator Implant Date: 20220513
Pulse Gen Model: 436066
Pulse Gen Serial Number: 94062842

## 2025-03-04 ENCOUNTER — Ambulatory Visit

## 2025-04-04 ENCOUNTER — Ambulatory Visit

## 2025-05-05 ENCOUNTER — Ambulatory Visit

## 2025-06-05 ENCOUNTER — Ambulatory Visit

## 2025-07-06 ENCOUNTER — Ambulatory Visit

## 2025-08-06 ENCOUNTER — Ambulatory Visit

## 2025-09-06 ENCOUNTER — Ambulatory Visit
